# Patient Record
Sex: Female | Born: 1955
Health system: Southern US, Community
[De-identification: ages and names within clinical notes are randomized; demographics above are authoritative.]

## PROBLEM LIST (undated history)

## (undated) DIAGNOSIS — Z9289 Personal history of other medical treatment: Secondary | ICD-10-CM

## (undated) DIAGNOSIS — I10 Essential (primary) hypertension: Secondary | ICD-10-CM

## (undated) DIAGNOSIS — Z720 Tobacco use: Secondary | ICD-10-CM

## (undated) DIAGNOSIS — G459 Transient cerebral ischemic attack, unspecified: Secondary | ICD-10-CM

## (undated) DIAGNOSIS — Z8489 Family history of other specified conditions: Secondary | ICD-10-CM

## (undated) DIAGNOSIS — J45909 Unspecified asthma, uncomplicated: Secondary | ICD-10-CM

## (undated) DIAGNOSIS — R0600 Dyspnea, unspecified: Secondary | ICD-10-CM

## (undated) DIAGNOSIS — D649 Anemia, unspecified: Secondary | ICD-10-CM

## (undated) DIAGNOSIS — Z7189 Other specified counseling: Secondary | ICD-10-CM

## (undated) DIAGNOSIS — D500 Iron deficiency anemia secondary to blood loss (chronic): Principal | ICD-10-CM

## (undated) HISTORY — DX: Essential (primary) hypertension: I10

## (undated) HISTORY — DX: Iron deficiency anemia secondary to blood loss (chronic): D50.0

## (undated) HISTORY — PX: TRANSURETHRAL RESECTION OF BLADDER TUMOR WITH GYRUS (TURBT-GYRUS): SHX6458

## (undated) HISTORY — DX: Other specified counseling: Z71.89

## (undated) HISTORY — PX: OTHER SURGICAL HISTORY: SHX169

## (undated) HISTORY — DX: Tobacco use: Z72.0

---

## 2009-07-27 ENCOUNTER — Emergency Department (HOSPITAL_BASED_OUTPATIENT_CLINIC_OR_DEPARTMENT_OTHER): Admission: EM | Admit: 2009-07-27 | Discharge: 2009-07-27 | Payer: Self-pay | Admitting: Emergency Medicine

## 2009-08-31 ENCOUNTER — Ambulatory Visit: Payer: Self-pay | Admitting: Family

## 2009-08-31 DIAGNOSIS — F17200 Nicotine dependence, unspecified, uncomplicated: Secondary | ICD-10-CM | POA: Insufficient documentation

## 2009-08-31 DIAGNOSIS — I10 Essential (primary) hypertension: Secondary | ICD-10-CM | POA: Insufficient documentation

## 2009-08-31 LAB — CONVERTED CEMR LAB
ALT: 17 units/L (ref 0–35)
AST: 20 units/L (ref 0–37)
Albumin: 4 g/dL (ref 3.5–5.2)
Alkaline Phosphatase: 116 units/L (ref 39–117)
BUN: 22 mg/dL (ref 6–23)
Basophils Absolute: 0 10*3/uL (ref 0.0–0.1)
Basophils Relative: 0.1 % (ref 0.0–3.0)
Bilirubin, Direct: 0.1 mg/dL (ref 0.0–0.3)
CO2: 29 meq/L (ref 19–32)
Calcium: 9.5 mg/dL (ref 8.4–10.5)
Chloride: 105 meq/L (ref 96–112)
Cholesterol: 182 mg/dL (ref 0–200)
Creatinine, Ser: 1.5 mg/dL — ABNORMAL HIGH (ref 0.4–1.2)
Eosinophils Absolute: 0.2 10*3/uL (ref 0.0–0.7)
Eosinophils Relative: 4.4 % (ref 0.0–5.0)
GFR calc non Af Amer: 46.61 mL/min (ref >60)
Glucose, Bld: 90 mg/dL (ref 70–99)
HCT: 40.3 % (ref 36.0–46.0)
HDL: 65.2 mg/dL (ref >39.00)
Hemoglobin: 13.4 g/dL (ref 12.0–15.0)
LDL Cholesterol: 102 mg/dL — ABNORMAL HIGH (ref 0–99)
Lymphocytes Relative: 17.7 % (ref 12.0–46.0)
Lymphs Abs: 1 10*3/uL (ref 0.7–4.0)
MCHC: 33.2 g/dL (ref 30.0–36.0)
MCV: 94.8 fL (ref 78.0–100.0)
Monocytes Absolute: 0.9 10*3/uL (ref 0.1–1.0)
Monocytes Relative: 15.8 % — ABNORMAL HIGH (ref 3.0–12.0)
Neutro Abs: 3.3 10*3/uL (ref 1.4–7.7)
Neutrophils Relative %: 62 % (ref 43.0–77.0)
Platelets: 175 10*3/uL (ref 150.0–400.0)
Potassium: 4.1 meq/L (ref 3.5–5.1)
RBC: 4.25 M/uL (ref 3.87–5.11)
RDW: 14.1 % (ref 11.5–14.6)
Sodium: 140 meq/L (ref 135–145)
TSH: 0.66 microintl units/mL (ref 0.35–5.50)
Total Bilirubin: 0.6 mg/dL (ref 0.3–1.2)
Total CHOL/HDL Ratio: 3
Total Protein: 8.1 g/dL (ref 6.0–8.3)
Triglycerides: 76 mg/dL (ref 0.0–149.0)
VLDL: 15.2 mg/dL (ref 0.0–40.0)
WBC: 5.4 10*3/uL (ref 4.5–10.5)

## 2009-09-01 ENCOUNTER — Encounter: Payer: Self-pay | Admitting: Family

## 2009-09-16 ENCOUNTER — Ambulatory Visit: Payer: Self-pay | Admitting: Family

## 2010-08-24 NOTE — Assessment & Plan Note (Signed)
Summary: New to be est , - jr   Vital Signs:  Patient profile:   55 year old female Height:      65 inches Weight:      90.6 pounds BMI:     15.13 Pulse rate:   72 / minute BP sitting:   142 / 98  (left arm)  Vitals Entered By: Doristine Devoid (August 31, 2009 11:09 AM) CC: NEW EST- refill on meds and something to help w/ smoking    CC:  NEW EST- refill on meds and something to help w/ smoking .  History of Present Illness: Donna Curry is a 55 year old female who presents today to establish care.  Notes that she went to the [facility] ER after her sister who is an Charity fundraiser checked her blood pressure and found it to be high.  She was prescribed Atenolol/chlorthalidone 100-25mg , and lisinopril 20mg .  Tells me that she was on these medicines prior to her trip to the ER but she had run out.  She has previously been seen in the [facility] med center.  She has never had a primary care provider.       Past History:  Past Medical History: Hypertension back pain  Past Surgical History: Tubal ligation ?intrautine ablation for heavy bleeding (done in [place])  Family History: CAD-no HTN-father,mother,sister DM-father STROKE-no COLON CA-no BREAST CA-no  Mom- hypertension Dad- deceased due to diabetes complications age 69.  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  Physical Exam  General:  Very thin african [place] female, appears older than stated age.   Head:  Normocephalic and atraumatic without obvious abnormalities. No apparent alopecia or balding. Neck:  No deformities, masses, or tenderness noted. Lungs:  Normal respiratory effort, chest expands symmetrically. Lungs  are clear to auscultation, no crackles or wheezes. Heart:  Normal rate and regular rhythm. S1 and S2 normal without gallop, murmur, click, rub or other extra sounds. Extremities:  no edema   Impression & Recommendations:  Problem # 1:  HYPERTENSION (ICD-401.9) Assessment Deteriorated  Patient ran out of medicine- did not take today. Will refill meds and have patient follow up in 2 weeks for BP check and complete physical.   BP today: 142/98  Her updated medication list for this problem includes:    Lisinopril 10 Mg Tabs (Lisinopril) ..... One tablet by mouth daily    Atenolol-chlorthalidone 100-25 Mg Tabs (Atenolol-chlorthalidone) ..... One tablet by mouth daily  Problem # 2:  TOBACCO ABUSE (ICD-305.1) Assessment: Comment Only  Wants to quit, patient will start on Chantix, side effects and risks discussed with patient including discontinuation of med and going directly to the ED if suicidal ideation.  Her updated medication list for this problem includes:    Chantix Starting Month Pak 0.5 Mg X 11 & 1 Mg X 42 Tabs (Varenicline tartrate) .Marland Kitchen... Take as directed  Encouraged smoking cessation and discussed different methods for smoking cessation. greater than 10 minutes spent on counselling.   Orders: Tobacco use cessation intensive >10 minutes (48546)  Complete Medication List: 1)  Chantix Starting Month Pak 0.5 Mg X 11 & 1 Mg X  42 Tabs (Varenicline tartrate) .... Take as directed 2)  Lisinopril 10 Mg Tabs (Lisinopril) .... One tablet by mouth daily 3)  Atenolol-chlorthalidone 100-25 Mg Tabs (Atenolol-chlorthalidone) .... One tablet by mouth daily  Other Orders: Venipuncture (61443) TLB-Lipid Panel (80061-LIPID) TLB-CBC Platelet - w/Differential (85025-CBCD) TLB-BMP (Basic Metabolic Panel-BMET) (80048-METABOL) TLB-Hepatic/Liver Function Pnl (80076-HEPATIC) TLB-TSH (Thyroid Stimulating Hormone) (15400-QQP)  Patient Instructions: 1)  Please return in [facility] to [facility] office  for a complete physical.   2)  Complete your lab work prior to leaving today. 3)  Start chantix- start 0.5mg  by mouth daily x 3 days, then 0.5 mg twice daily x 4 days, then 1mg  twice daily for 11 weeks.  Stop smoking after 7 days.   4)  Discontinue chantix and go to ER if you develop severe depression or suicidal thoughts. 5)  Limit your Sodium (Salt). Prescriptions: ATENOLOL-CHLORTHALIDONE 100-25 MG TABS (ATENOLOL-CHLORTHALIDONE) one tablet by mouth daily  #30 x 1   Entered and Authorized by:   Lemont Fillers FNP   Signed by:   Lemont Fillers FNP on 08/31/2009   Method used:   Electronically to        [facility] # [phone]* (retail)       [address]       [place], [place]  [address]       Ph: [phone]       Fax: [phone]   RxID:   [id number] LISINOPRIL 10 MG TABS (LISINOPRIL) one tablet by mouth daily  #30 x 1   Entered and Authorized by:   Lemont Fillers FNP   Signed by:   Lemont Fillers FNP on 08/31/2009   Method used:   Electronically to        [facility] # [phone]* (retail)       [address]       [place], [place]  [address]       Ph: [phone]       Fax: [phone]   RxID:   [id number] CHANTIX STARTING MONTH PAK 0.5 MG X 11 & 1 MG X 42 TABS (VARENICLINE TARTRATE) take as directed  #1 month x 0   Entered and Authorized by:   Lemont Fillers FNP   Signed by:   Lemont Fillers FNP on 08/31/2009   Method used:   Electronically to        [facility] # [phone]* (retail)       [address]       [place], [place]  [address]       Ph: [phone]       Fax: [phone]   RxID:   [id number]

## 2010-08-24 NOTE — Letter (Signed)
   [facility] [address] [place], [place] [address] [phone]    September 01, 2009   Donna Curry [address] [place], [place] [address]  RE:  LAB RESULTS  Dear  [name],  The following is an interpretation of your most recent lab tests.  Please take note of any instructions provided or changes to medications that have resulted from your lab work.  ELECTROLYTES:  Good - no changes needed  KIDNEY FUNCTION TESTS:  Fair - review at your next visit  LIVER FUNCTION TESTS:  Good - no changes needed  LIPID PANEL:  Good - no changes needed Triglyceride: 76.0   Cholesterol: 182   LDL: 102   HDL: 65.20   Chol/HDL%:  3  THYROID STUDIES:  Thyroid studies normal TSH: 0.66     DIABETIC STUDIES:  Excellent - no changes needed Blood Glucose: 90    CBC:  Good - no changes needed   Sincerely Yours,    Lemont Fillers FNP

## 2010-10-10 LAB — DIFFERENTIAL
Basophils Absolute: 0.1 10*3/uL (ref 0.0–0.1)
Basophils Relative: 3 % — ABNORMAL HIGH (ref 0–1)
Eosinophils Absolute: 0.1 10*3/uL (ref 0.0–0.7)
Eosinophils Relative: 2 % (ref 0–5)
Lymphocytes Relative: 29 % (ref 12–46)
Lymphs Abs: 1.4 10*3/uL (ref 0.7–4.0)
Monocytes Absolute: 0.4 10*3/uL (ref 0.1–1.0)
Monocytes Relative: 8 % (ref 3–12)
Neutro Abs: 2.8 10*3/uL (ref 1.7–7.7)
Neutrophils Relative %: 59 % (ref 43–77)

## 2010-10-10 LAB — BASIC METABOLIC PANEL
BUN: 19 mg/dL (ref 6–23)
CO2: 24 mEq/L (ref 19–32)
Calcium: 10.1 mg/dL (ref 8.4–10.5)
Chloride: 104 mEq/L (ref 96–112)
Creatinine, Ser: 1.1 mg/dL (ref 0.4–1.2)
GFR calc Af Amer: >60 mL/min (ref >60)
GFR calc non Af Amer: 52 mL/min — ABNORMAL LOW (ref >60)
Glucose, Bld: 102 mg/dL — ABNORMAL HIGH (ref 70–99)
Potassium: 4.2 mEq/L (ref 3.5–5.1)
Sodium: 141 mEq/L (ref 135–145)

## 2010-10-10 LAB — CBC
HCT: 40.4 % (ref 36.0–46.0)
Hemoglobin: 13.6 g/dL (ref 12.0–15.0)
MCHC: 33.6 g/dL (ref 30.0–36.0)
MCV: 92.4 fL (ref 78.0–100.0)
Platelets: 233 10*3/uL (ref 150–400)
RBC: 4.38 MIL/uL (ref 3.87–5.11)
RDW: 13.6 % (ref 11.5–15.5)
WBC: 4.8 10*3/uL (ref 4.0–10.5)

## 2011-03-01 ENCOUNTER — Ambulatory Visit (INDEPENDENT_AMBULATORY_CARE_PROVIDER_SITE_OTHER): Payer: Self-pay | Admitting: Family

## 2011-03-01 DIAGNOSIS — I10 Essential (primary) hypertension: Secondary | ICD-10-CM

## 2011-03-03 NOTE — Progress Notes (Signed)
  Subjective:    Patient ID: Donna Curry, female    DOB: August 31, 1955, 55 y.o.   MRN: [id number]  HPI    Review of Systems     Objective:   Physical Exam        Assessment & Plan:  .  Pt cancelled- not seen.

## 2011-03-07 ENCOUNTER — Telehealth: Payer: Self-pay | Admitting: Family

## 2011-03-07 NOTE — Telephone Encounter (Signed)
Refill for Atenolol-Chlorthaldone 100-25, and lisinopril 10 mg denied. Last office visit was February of 2011. Patient will need office visit for refills.  Call placed to patient [phone], voice recording reached stating the phone number is a non-working number.

## 2011-03-07 NOTE — Telephone Encounter (Signed)
Patient made an appt for 8-31 to re est. She would like a refill of lisinopril and atenolo/chlor? To last her until she comes in for her appt. [facility] on [address] in [place]

## 2011-03-09 ENCOUNTER — Ambulatory Visit: Payer: Self-pay | Admitting: Family

## 2011-03-09 ENCOUNTER — Encounter: Payer: Self-pay | Admitting: Family

## 2011-03-09 ENCOUNTER — Ambulatory Visit (INDEPENDENT_AMBULATORY_CARE_PROVIDER_SITE_OTHER): Payer: BC Managed Care – PPO | Admitting: Family

## 2011-03-09 VITALS — BP 230/110 | HR 86 | Temp 98.0°F | Ht 65.0 in | Wt 87.1 lb

## 2011-03-09 DIAGNOSIS — I10 Essential (primary) hypertension: Secondary | ICD-10-CM

## 2011-03-09 LAB — HEPATIC FUNCTION PANEL
ALT: 12 U/L (ref 0–35)
AST: 19 U/L (ref 0–37)
Albumin: 4.5 g/dL (ref 3.5–5.2)
Alkaline Phosphatase: 99 U/L (ref 39–117)
Bilirubin, Direct: 0.1 mg/dL (ref 0.0–0.3)
Indirect Bilirubin: 0.1 mg/dL (ref 0.0–0.9)
Total Bilirubin: 0.2 mg/dL — ABNORMAL LOW (ref 0.3–1.2)
Total Protein: 8 g/dL (ref 6.0–8.3)

## 2011-03-09 LAB — CBC WITH DIFFERENTIAL/PLATELET
Basophils Absolute: 0 10*3/uL (ref 0.0–0.1)
Basophils Relative: 1 % (ref 0–1)
Eosinophils Absolute: 0.2 10*3/uL (ref 0.0–0.7)
Eosinophils Relative: 3 % (ref 0–5)
HCT: 36.2 % (ref 36.0–46.0)
Hemoglobin: 11.8 g/dL — ABNORMAL LOW (ref 12.0–15.0)
Lymphocytes Relative: 31 % (ref 12–46)
Lymphs Abs: 1.5 10*3/uL (ref 0.7–4.0)
MCH: 28.2 pg (ref 26.0–34.0)
MCHC: 32.6 g/dL (ref 30.0–36.0)
MCV: 86.6 fL (ref 78.0–100.0)
Monocytes Absolute: 0.4 10*3/uL (ref 0.1–1.0)
Monocytes Relative: 8 % (ref 3–12)
Neutro Abs: 2.8 10*3/uL (ref 1.7–7.7)
Neutrophils Relative %: 57 % (ref 43–77)
Platelets: 240 10*3/uL (ref 150–400)
RBC: 4.18 MIL/uL (ref 3.87–5.11)
RDW: 17.3 % — ABNORMAL HIGH (ref 11.5–15.5)
WBC: 4.9 10*3/uL (ref 4.0–10.5)

## 2011-03-09 LAB — BASIC METABOLIC PANEL
BUN: 14 mg/dL (ref 6–23)
CO2: 24 mEq/L (ref 19–32)
Calcium: 9.7 mg/dL (ref 8.4–10.5)
Chloride: 105 mEq/L (ref 96–112)
Creat: 1.03 mg/dL (ref 0.50–1.10)
Glucose, Bld: 79 mg/dL (ref 70–99)
Potassium: 4.6 mEq/L (ref 3.5–5.3)
Sodium: 139 mEq/L (ref 135–145)

## 2011-03-09 MED ORDER — CLONIDINE HCL 0.1 MG PO TABS: 0.1000 mg | ORAL_TABLET | Freq: Two times a day (BID) | ORAL | Status: DC

## 2011-03-09 MED ORDER — ATENOLOL-CHLORTHALIDONE 100-25 MG PO TABS: 1.0000 | ORAL_TABLET | Freq: Every day | ORAL | Status: DC

## 2011-03-09 MED ORDER — LISINOPRIL 10 MG PO TABS: 10.0000 mg | ORAL_TABLET | Freq: Every day | ORAL | Status: DC

## 2011-03-09 NOTE — Assessment & Plan Note (Signed)
Deteriorated.  We discussed the importance of medication compliance and the importance of close follow up.  Case was discussed with Dr. Rodena Medin.  Will resume her home meds, add clonidine- first dose ASAP.  Check baseline labs, follow up in 2 days.

## 2011-03-09 NOTE — Patient Instructions (Addendum)
Follow up on Friday of this week. Complete your lab work on the first floor today. Start clonidine immediately. Do not abruptly discontinue the clonidine as this may increase rebound elevation of your blood pressure and increase your risk of stroke.

## 2011-03-09 NOTE — Progress Notes (Signed)
  Subjective:    Patient ID: Donna Curry, female    DOB: 1956-04-09, 55 y.o.   MRN: [id number]  HPI  Ms.  Donna Curry is a 55 yr old female who presents today "to establish care."  Review of her medical record shows that she was actually seen by me in the [facility] office back in 2011. She reports no steady primary care provider.  She has been using urgent cares.  HTN- has been out of her blood pressure medication for 3 months.  Patient has been treated for Chronic HTN for 3 years. She is currently on no medication, and poorly controlled. No associated S/S related to HTN.   Quality: chronic Modifying factor: meds Duration: Quite sometime Associated S/S: None.  The patient denies the following associated symptoms: Chest pain, dyspnea, blurred vision, headache, or lower extremity edema.     Review of Systems  Constitutional: Negative for fever and unexpected weight change.  Respiratory: Negative for shortness of breath.   Cardiovascular: Negative for chest pain and leg swelling.   See HPI  Past Medical History  Diagnosis Date  . Measles as a child  . Mumps as a child  . Hypertension   . Tobacco abuse     History   Social History  . Marital Status: Single    Spouse Name: N/A    Number of Children: N/A  . Years of Education: N/A   Occupational History  . Not on file.   Social History Main Topics  . Smoking status: Current Everyday Smoker -- 0.5 packs/day    Types: Cigarettes  . Smokeless tobacco: Never Used  . Alcohol Use: No  . Drug Use: Yes     marijuana   . Sexually Active: No   Other Topics Concern  . Not on file   Social History Narrative  . No narrative on file    Past Surgical History  Procedure Date  . Tubes tied     Family History  Problem Relation Age of Onset  . Hypertension Mother   . Diabetes Father   . Hypertension Brother   . HIV Brother     No Known Allergies  No current outpatient prescriptions on file prior to visit.    BP 240/132   Pulse 86  Temp(Src) 98 F (36.7 C) (Oral)  Ht 5\' 5"  (1.651 m)  Wt 87 lb 1.3 oz (39.499 kg)  BMI 14.49 kg/m2  SpO2 100%       Objective:   Physical Exam  Constitutional:       Cachectic appearing, pleasant AA female in NAD  Eyes: Conjunctivae are normal.  Neck: Neck supple.  Cardiovascular: Normal rate and regular rhythm.   Pulmonary/Chest: Effort normal and breath sounds normal.  Abdominal: Soft. Bowel sounds are normal. She exhibits no distension and no mass. There is no tenderness. There is no rebound and no guarding.  Musculoskeletal: She exhibits no edema.  Psychiatric: She has a normal mood and affect. Her behavior is normal. Judgment and thought content normal.          Assessment & Plan:  30 minutes spent with the patient today.  >50% of this time was spent counseling pt on her HTN.

## 2011-03-11 ENCOUNTER — Encounter: Payer: Self-pay | Admitting: Family

## 2011-03-11 ENCOUNTER — Ambulatory Visit (INDEPENDENT_AMBULATORY_CARE_PROVIDER_SITE_OTHER): Payer: BC Managed Care – PPO | Admitting: Family

## 2011-03-11 DIAGNOSIS — F17200 Nicotine dependence, unspecified, uncomplicated: Secondary | ICD-10-CM

## 2011-03-11 DIAGNOSIS — I10 Essential (primary) hypertension: Secondary | ICD-10-CM

## 2011-03-11 DIAGNOSIS — D649 Anemia, unspecified: Secondary | ICD-10-CM | POA: Insufficient documentation

## 2011-03-11 NOTE — Assessment & Plan Note (Signed)
Improved.  Pt instructed to continue current meds and follow up in 1 month for a complete fasting physical.

## 2011-03-11 NOTE — Patient Instructions (Signed)
Please follow up in 1 month for a complete physical. Come fasting to this appointment.

## 2011-03-11 NOTE — Progress Notes (Signed)
  Subjective:    Patient ID: Donna Curry, female    DOB: May 25, 1956, 55 y.o.   MRN: [id number]  HPI  Patient presents today for followup of hypertension.  Patient has been treated for Chronic HTN for quiet sometime. She is currently on tenoretic, clonidine and lisinopril, and is well controlled. No associated S/S related to HTN.   Quality: chronic Modifying factor: meds Duration: Quite sometime Associated S/S: None.  The patient denies the following associated symptoms: Chest pain, dyspnea, blurred vision, headache, or lower extremity edema. She reports that she is feeling better since getting back on her blood pressure medication.      Review of Systems See HPI  Past Medical History  Diagnosis Date  . Measles as a child  . Mumps as a child  . Hypertension   . Tobacco abuse     History   Social History  . Marital Status: Single    Spouse Name: N/A    Number of Children: N/A  . Years of Education: N/A   Occupational History  . Not on file.   Social History Main Topics  . Smoking status: Current Everyday Smoker -- 0.5 packs/day    Types: Cigarettes  . Smokeless tobacco: Never Used   Comment: no cigarettes since 03/09/11  . Alcohol Use: No  . Drug Use: Yes     marijuana   . Sexually Active: No   Other Topics Concern  . 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    Past Surgical History  Procedure Date  . Tubes tied   . Uterine ablation     about 2005    Family History  Problem Relation Age of Onset  . Hypertension Mother   . Diabetes Father   . Hypertension Brother   . HIV Brother     No Known Allergies  Current Outpatient Prescriptions on File Prior to Visit  Medication Sig Dispense Refill  . atenolol-chlorthalidone (TENORETIC) 100-25 MG per tablet Take 1 tablet by mouth daily.  30 tablet   2  . cloNIDine (CATAPRES) 0.1 MG tablet Take 1 tablet (0.1 mg total) by mouth 2 (two) times daily.  60 tablet  2  . lisinopril (PRINIVIL,ZESTRIL) 10 MG tablet Take 1 tablet (10 mg total) by mouth daily.  30 tablet  2    BP 140/92  Pulse 61  Temp(Src) 97.6 F (36.4 C) (Oral)  Ht 5\' 5"  (1.651 m)  Wt 86 lb (39.009 kg)  BMI 14.31 kg/m2       Objective:   Physical Exam  Constitutional: She appears well-developed and well-nourished.  HENT:  Head: Normocephalic and atraumatic.  Cardiovascular: Normal rate and regular rhythm.   No murmur heard. Pulmonary/Chest: Effort normal and breath sounds normal. No respiratory distress. She has no wheezes. She has no rales. She exhibits no tenderness.  Musculoskeletal: She exhibits no edema.  Psychiatric: She has a normal mood and affect. Her behavior is normal. Thought content normal.          Assessment & Plan:

## 2011-03-11 NOTE — Assessment & Plan Note (Signed)
She has not had a cigarette in 24 hours. I encouraged her to continue the good work with quitting smoking.

## 2011-03-25 ENCOUNTER — Ambulatory Visit: Payer: Self-pay | Admitting: Family

## 2011-04-11 ENCOUNTER — Encounter: Payer: BC Managed Care – PPO | Admitting: Family

## 2011-04-13 ENCOUNTER — Encounter: Payer: BC Managed Care – PPO | Admitting: Family

## 2011-04-13 DIAGNOSIS — Z0289 Encounter for other administrative examinations: Secondary | ICD-10-CM

## 2011-07-11 ENCOUNTER — Other Ambulatory Visit: Payer: Self-pay | Admitting: Family

## 2011-07-25 ENCOUNTER — Ambulatory Visit: Payer: BC Managed Care – PPO | Admitting: Family

## 2011-07-25 DIAGNOSIS — Z0289 Encounter for other administrative examinations: Secondary | ICD-10-CM

## 2011-08-22 ENCOUNTER — Other Ambulatory Visit: Payer: Self-pay | Admitting: *Deleted

## 2011-08-22 MED ORDER — ATENOLOL-CHLORTHALIDONE 100-25 MG PO TABS: 1.0000 | ORAL_TABLET | Freq: Every day | ORAL | Status: DC

## 2011-08-22 MED ORDER — LISINOPRIL 10 MG PO TABS: 10.0000 mg | ORAL_TABLET | Freq: Every day | ORAL | Status: DC

## 2011-08-22 MED ORDER — CLONIDINE HCL 0.1 MG PO TABS: 0.1000 mg | ORAL_TABLET | Freq: Two times a day (BID) | ORAL | Status: DC

## 2011-08-22 NOTE — Telephone Encounter (Signed)
OK to give 7 day supply- no refills.

## 2011-08-22 NOTE — Telephone Encounter (Signed)
Received voice message from pt requesting refills on: lisinopril, tenoretic and clonidine. Pt was last seen in 03/11/11 and advised f/u in 1 month for a physical. Pt has cancelled numerous appts. States she has scheduled f/u on 09/23/11. Please advise re: refills.

## 2011-08-22 NOTE — Telephone Encounter (Signed)
Attempted to reach pt and received message that wireless customer was not available. 7 days supply of each med below sent to pharmacy. Will try pt tomorrow.

## 2011-08-23 NOTE — Telephone Encounter (Signed)
Notified pt and rescheduled f/u for 08/30/11 at 11am with Sandford Craze, NP

## 2011-08-30 ENCOUNTER — Ambulatory Visit: Payer: BC Managed Care – PPO | Admitting: Family

## 2011-08-30 ENCOUNTER — Telehealth: Payer: Self-pay | Admitting: Family

## 2011-08-30 NOTE — Telephone Encounter (Signed)
She has cancelled 4 appointments and no showed twice.  She was told last week that she would only be given 7 day supply and that she would need to be seen before further refills could be called in.  She was due for follow up 5 months ago.  I cannot give her any further refills at this time without examining her in the office.  If she is unable to be seen in our office, then I recommend that have a friend bring her to the nearest urgent care for evaluation.

## 2011-08-30 NOTE — Telephone Encounter (Signed)
Please see previous phone note dated 08/22/11.

## 2011-08-30 NOTE — Telephone Encounter (Signed)
Notified pt of instructions below. States she has enough medication to last her until Friday and scheduled follow up for 09/02/11 at 10:45am.

## 2011-08-30 NOTE — Telephone Encounter (Signed)
YOU GAVE HER 7 PILLS WHEN SHE WAS HERE LAST FOR HER BP.  HER CAR WONT START WE RESCHEDULED HER APPOINTMENT TO THE 18TH @ 11.  SHE WILL NEED MEDS TILL THEN.  SHE CAN GET SOMEONE TO PICK THEM UP FOR HER.  IF YOU WANT TO SEND RX SEND TO [facility] ON [address] [place]

## 2011-09-02 ENCOUNTER — Ambulatory Visit: Payer: BC Managed Care – PPO | Admitting: Family

## 2011-09-02 DIAGNOSIS — Z0289 Encounter for other administrative examinations: Secondary | ICD-10-CM

## 2011-09-12 ENCOUNTER — Ambulatory Visit: Payer: BC Managed Care – PPO | Admitting: Family

## 2011-09-23 ENCOUNTER — Ambulatory Visit: Payer: BC Managed Care – PPO | Admitting: Family

## 2011-09-27 ENCOUNTER — Ambulatory Visit: Payer: BC Managed Care – PPO | Admitting: Family

## 2011-12-09 ENCOUNTER — Ambulatory Visit: Payer: BC Managed Care – PPO | Admitting: Family

## 2011-12-13 ENCOUNTER — Ambulatory Visit: Payer: BC Managed Care – PPO | Admitting: Family

## 2011-12-14 ENCOUNTER — Ambulatory Visit: Payer: BC Managed Care – PPO | Admitting: Family

## 2011-12-14 DIAGNOSIS — Z0289 Encounter for other administrative examinations: Secondary | ICD-10-CM

## 2012-01-27 ENCOUNTER — Ambulatory Visit: Payer: BC Managed Care – PPO | Admitting: Family

## 2012-01-30 ENCOUNTER — Telehealth: Payer: Self-pay | Admitting: *Deleted

## 2012-01-30 ENCOUNTER — Encounter: Payer: Self-pay | Admitting: Family

## 2012-01-30 ENCOUNTER — Ambulatory Visit (INDEPENDENT_AMBULATORY_CARE_PROVIDER_SITE_OTHER): Payer: BC Managed Care – PPO | Admitting: Family

## 2012-01-30 ENCOUNTER — Ambulatory Visit (HOSPITAL_BASED_OUTPATIENT_CLINIC_OR_DEPARTMENT_OTHER)
Admission: RE | Admit: 2012-01-30 | Discharge: 2012-01-30 | Disposition: A | Discharge status: Home / Self Care | Payer: BC Managed Care – PPO | Source: Ambulatory Visit | Attending: Family | Admitting: Family

## 2012-01-30 ENCOUNTER — Other Ambulatory Visit: Payer: Self-pay | Admitting: Family

## 2012-01-30 VITALS — BP 98/74 | HR 76 | Temp 97.9°F | Resp 16 | Ht 65.0 in | Wt 84.1 lb

## 2012-01-30 DIAGNOSIS — E8809 Other disorders of plasma-protein metabolism, not elsewhere classified: Secondary | ICD-10-CM

## 2012-01-30 DIAGNOSIS — M549 Dorsalgia, unspecified: Secondary | ICD-10-CM

## 2012-01-30 DIAGNOSIS — R7303 Prediabetes: Secondary | ICD-10-CM | POA: Insufficient documentation

## 2012-01-30 DIAGNOSIS — D72829 Elevated white blood cell count, unspecified: Secondary | ICD-10-CM

## 2012-01-30 DIAGNOSIS — R7309 Other abnormal glucose: Secondary | ICD-10-CM

## 2012-01-30 DIAGNOSIS — E278 Other specified disorders of adrenal gland: Secondary | ICD-10-CM | POA: Insufficient documentation

## 2012-01-30 DIAGNOSIS — Z1231 Encounter for screening mammogram for malignant neoplasm of breast: Secondary | ICD-10-CM

## 2012-01-30 DIAGNOSIS — E279 Disorder of adrenal gland, unspecified: Secondary | ICD-10-CM

## 2012-01-30 DIAGNOSIS — I10 Essential (primary) hypertension: Secondary | ICD-10-CM

## 2012-01-30 DIAGNOSIS — M5136 Other intervertebral disc degeneration, lumbar region: Secondary | ICD-10-CM | POA: Insufficient documentation

## 2012-01-30 DIAGNOSIS — Z0000 Encounter for general adult medical examination without abnormal findings: Secondary | ICD-10-CM

## 2012-01-30 LAB — BASIC METABOLIC PANEL
BUN: 27 mg/dL — ABNORMAL HIGH (ref 6–23)
CO2: 28 mEq/L (ref 19–32)
Calcium: 9.5 mg/dL (ref 8.4–10.5)
Chloride: 101 mEq/L (ref 96–112)
Creat: 1.32 mg/dL — ABNORMAL HIGH (ref 0.50–1.10)
Glucose, Bld: 140 mg/dL — ABNORMAL HIGH (ref 70–99)
Potassium: 4.6 mEq/L (ref 3.5–5.3)
Sodium: 136 mEq/L (ref 135–145)

## 2012-01-30 LAB — HEPATIC FUNCTION PANEL
ALT: 17 U/L (ref 0–35)
AST: 16 U/L (ref 0–37)
Albumin: 3.9 g/dL (ref 3.5–5.2)
Alkaline Phosphatase: 89 U/L (ref 39–117)
Bilirubin, Direct: 0.1 mg/dL (ref 0.0–0.3)
Indirect Bilirubin: 0.2 mg/dL (ref 0.0–0.9)
Total Bilirubin: 0.3 mg/dL (ref 0.3–1.2)
Total Protein: 7.2 g/dL (ref 6.0–8.3)

## 2012-01-30 LAB — CBC WITH DIFFERENTIAL/PLATELET
Basophils Absolute: 0 10*3/uL (ref 0.0–0.1)
Basophils Relative: 0 % (ref 0–1)
Eosinophils Absolute: 0.1 10*3/uL (ref 0.0–0.7)
Eosinophils Relative: 2 % (ref 0–5)
HCT: 36.4 % (ref 36.0–46.0)
Hemoglobin: 11.8 g/dL — ABNORMAL LOW (ref 12.0–15.0)
Lymphocytes Relative: 24 % (ref 12–46)
Lymphs Abs: 1.4 10*3/uL (ref 0.7–4.0)
MCH: 28.5 pg (ref 26.0–34.0)
MCHC: 32.4 g/dL (ref 30.0–36.0)
MCV: 87.9 fL (ref 78.0–100.0)
Monocytes Absolute: 0.4 10*3/uL (ref 0.1–1.0)
Monocytes Relative: 7 % (ref 3–12)
Neutro Abs: 4 10*3/uL (ref 1.7–7.7)
Neutrophils Relative %: 67 % (ref 43–77)
Platelets: 262 10*3/uL (ref 150–400)
RBC: 4.14 MIL/uL (ref 3.87–5.11)
RDW: 16.2 % — ABNORMAL HIGH (ref 11.5–15.5)
WBC: 5.9 10*3/uL (ref 4.0–10.5)

## 2012-01-30 NOTE — Assessment & Plan Note (Signed)
Note A1C is 6.0.  Plan to repeat in 3 months.

## 2012-01-30 NOTE — Assessment & Plan Note (Signed)
Will plan to contrasted CT if creatinine is stable to further evaluate abnormality in the RLQ and the ?adrenal adenomas.

## 2012-01-30 NOTE — Assessment & Plan Note (Addendum)
This is likely related to finding of degenerative disc disease of the lumbar spine noted on CT.  Recommended tylenol/motrin PRN.

## 2012-01-30 NOTE — Progress Notes (Signed)
Subjective:    Patient ID: Donna Curry, female    DOB: 1956/01/09, 56 y.o.   MRN: [id number]  HPI  Donna Curry is a 56 yr old female who presents today for hospital follow up.  Recently hospitalized 01/21/12 due to uncontrolled HTN, Hypercalcemia, hyperglycemia, proteinuria, nausea and vomiting.  Records are reviewed.  During this hospitalization, her blood pressure medications were adjusted and she was felt stable for discharge home. She did undergo CT head which was unremarkable except for some chronic microvascular changes.   1) Hyperglycemia- Her blood sugar was noted to be 182 upon admission. An A1C was drawn which was 6.0. TSH was normal during her hospitalization.  2) Hypercalcemia- Calcium was noted to be 11.3.  Her total protein was elevated at 10.0.  PTH remains pending.  Vitamin D  3) Hyperproteinemia-  SPEP was drawn during hospitalization- and results remain pending.   4)Marijuana abuse- urine drug tox tested postitive for marijuana.  5) Adrenal masses/abdominal abnormality-  Non-contrasted CT performed during hospitalization noted bilateral adrenal masses suggestive of adrenal adenomas.  Also noted wass and ovoid very dense focus in the mesentary of the RLQ.    6) back pain- Pt's chief complaint today is low back pain.  Note was made on CT of moderate DDD at L3-4 and L4-5.   Review of Systems See HPI  Past Medical History  Diagnosis Date  . Measles as a child  . Mumps as a child  . Hypertension   . Tobacco abuse     History   Social History  . Marital Status: Single    Spouse Name: N/A    Number of Children: N/A  . Years of Education: N/A   Occupational History  . Not on file.   Social History Main Topics  . Smoking status: Current Everyday Smoker -- 0.5 packs/day    Types: Cigarettes  . Smokeless tobacco: Never Used   Comment: no cigarettes since 03/09/11  . Alcohol Use: No  . Drug Use: Yes     marijuana   . Sexually Active: No   Other Topics  Concern  . 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    Past Surgical History  Procedure Date  . Tubes tied   . Uterine ablation     about 2005    Family History  Problem Relation Age of Onset  . Hypertension Mother   . Diabetes Father   . Hypertension Brother   . HIV Brother     No Known Allergies  Current Outpatient Prescriptions on File Prior to Visit  Medication Sig Dispense Refill  . atenolol-chlorthalidone (TENORETIC) 100-25 MG per tablet Take 1 tablet by mouth daily.  7 tablet  0  . lisinopril-hydrochlorothiazide (PRINZIDE,ZESTORETIC) 20-12.5 MG per tablet Take 1 tablet by mouth 2 (two) times daily.      Marland Kitchen DISCONTD: cloNIDine (CATAPRES) 0.1 MG tablet Take 1 tablet (0.1 mg total) by mouth 2 (two) times daily.  14 tablet  0    BP 98/74  Pulse 76  Temp 97.9 F (36.6 C) (Oral)  Resp 16  Ht 5\' 5"  (1.651 m)  Wt 84 lb 1.9 oz (38.157 kg)  BMI 14.00 kg/m2  SpO2 99%       Objective:   Physical Exam  Constitutional: She appears cachectic. She is cooperative. No distress.  Eyes: No scleral icterus.  Cardiovascular: Normal rate and regular rhythm.   No murmur heard. Pulmonary/Chest: Effort normal and breath sounds normal. No respiratory distress. She has no wheezes. She has no rales. She exhibits no tenderness.  Abdominal: Soft. Bowel sounds are normal. She exhibits no distension and no mass. There is no tenderness. There is no rebound and no guarding.  Lymphadenopathy:    She has no cervical adenopathy.  Neurological: She is alert.  Psychiatric: She has a normal mood and affect. Her behavior is normal. Judgment and thought content normal.          Assessment & Plan:

## 2012-01-30 NOTE — Assessment & Plan Note (Signed)
BP appears overtreated at this time.  Will cut back clonidine from 0.2 to 0.1 bid.  Continue current dosing of zestoretic.

## 2012-01-30 NOTE — Patient Instructions (Addendum)
Please complete your lab work prior to leaving.  Follow up in 2 weeks. Cut clonidine 0.2 mg in half and continue twice daily.

## 2012-01-30 NOTE — Assessment & Plan Note (Addendum)
Repeat Calcium level today. I did refer her for a mammogram today as she is overdue.  Await PTH results from [facility] (pending).

## 2012-01-30 NOTE — Telephone Encounter (Signed)
Message copied by Kathi Simpers on Mon Jan 30, 2012  4:06 PM ------      Message from: O'SULLIVAN, MELISSA      Created: Mon Jan 30, 2012  2:10 PM       Could you pls ask lab to add on LFT (hyperproteinemia) thanks

## 2012-01-30 NOTE — Assessment & Plan Note (Signed)
Await SPEP results from [facility]. Repeat protein today.

## 2012-01-30 NOTE — Telephone Encounter (Signed)
Test added per Selena Batten at [place].

## 2012-01-31 ENCOUNTER — Telehealth: Payer: Self-pay | Admitting: Family

## 2012-01-31 DIAGNOSIS — R1900 Intra-abdominal and pelvic swelling, mass and lump, unspecified site: Secondary | ICD-10-CM

## 2012-01-31 NOTE — Telephone Encounter (Signed)
Please call pt and let her know that her creatinine is slightly elevated- likely related to her history of htn.  We should still be able to get CT with contrast however.  I have ordered and she should be contacted about scheduling.

## 2012-01-31 NOTE — Telephone Encounter (Signed)
Attempted to reach pt and received message that caller was not accepting calls at this time; unable to leave message.

## 2012-01-31 NOTE — Telephone Encounter (Signed)
Patient returned phone call. [facility] # [phone]

## 2012-01-31 NOTE — Telephone Encounter (Signed)
Notified pt. 

## 2012-01-31 NOTE — Telephone Encounter (Signed)
Spoke with MD at [facility] re: CT abd/pelvis.  Authorization # obtained- [id number]

## 2012-02-01 ENCOUNTER — Ambulatory Visit (HOSPITAL_BASED_OUTPATIENT_CLINIC_OR_DEPARTMENT_OTHER)
Admission: RE | Admit: 2012-02-01 | Discharge: 2012-02-01 | Disposition: A | Discharge status: Home / Self Care | Payer: BC Managed Care – PPO | Source: Ambulatory Visit | Attending: Family | Admitting: Family

## 2012-02-01 ENCOUNTER — Telehealth: Payer: Self-pay | Admitting: Family

## 2012-02-01 DIAGNOSIS — E279 Disorder of adrenal gland, unspecified: Secondary | ICD-10-CM | POA: Insufficient documentation

## 2012-02-01 DIAGNOSIS — R9389 Abnormal findings on diagnostic imaging of other specified body structures: Secondary | ICD-10-CM | POA: Insufficient documentation

## 2012-02-01 DIAGNOSIS — R1900 Intra-abdominal and pelvic swelling, mass and lump, unspecified site: Secondary | ICD-10-CM

## 2012-02-01 DIAGNOSIS — M549 Dorsalgia, unspecified: Secondary | ICD-10-CM | POA: Insufficient documentation

## 2012-02-01 DIAGNOSIS — R1031 Right lower quadrant pain: Secondary | ICD-10-CM | POA: Insufficient documentation

## 2012-02-01 MED ORDER — IOHEXOL 300 MG/ML  SOLN
75.0000 mL | Freq: Once | INTRAMUSCULAR | Status: AC | PRN
  Administered 2012-02-01: 75 mL via INTRAVENOUS

## 2012-02-01 NOTE — Telephone Encounter (Signed)
Pls call pt and let her know that her CT shows non-cancerous appearing growths on her kidneys.   Also the area of concern on previous CT appears to be a surgical metal clip.  She should have a follow up CT in 1 year to make sure that the growths do not increase in size.

## 2012-02-03 NOTE — Telephone Encounter (Signed)
Notified pt and she voices understanding. 

## 2012-02-13 ENCOUNTER — Ambulatory Visit: Payer: BC Managed Care – PPO | Admitting: Family

## 2012-02-15 ENCOUNTER — Ambulatory Visit (INDEPENDENT_AMBULATORY_CARE_PROVIDER_SITE_OTHER): Payer: BC Managed Care – PPO | Admitting: Family

## 2012-02-15 ENCOUNTER — Encounter: Payer: Self-pay | Admitting: Family

## 2012-02-15 VITALS — BP 90/70 | HR 82 | Temp 97.5°F | Resp 16 | Ht 65.0 in | Wt 82.0 lb

## 2012-02-15 DIAGNOSIS — M549 Dorsalgia, unspecified: Secondary | ICD-10-CM

## 2012-02-15 DIAGNOSIS — I10 Essential (primary) hypertension: Secondary | ICD-10-CM

## 2012-02-15 MED ORDER — METHYLPREDNISOLONE (PAK) 4 MG PO TABS: ORAL_TABLET | ORAL | Status: AC

## 2012-02-15 NOTE — Progress Notes (Signed)
  Subjective:    Patient ID: Donna Curry, female    DOB: [birth year], 56 y.o.   MRN: [id number]  HPI  [name] is a 56 yr old female who presents today for follow up of her HTN. She reports that she has been cutting the catapres 0.2mg  in half and taking bid.  She continues her bid dosing of zestoretic.   BP Readings from Last 3 Encounters:  02/15/12 90/70  01/30/12 98/74  03/11/11 140/92   Low back pain- Started to bother her when she got out of the hospital.  No improvement with ibuprofen or tylenol.  Pain shoots down the left hip.     Review of Systems See HPI  Past Medical History  Diagnosis Date  . Measles as a child  . Mumps as a child  . Hypertension   . Tobacco abuse     History   Social History  . Marital Status: Single    Spouse Name: N/A    Number of Children: N/A  . Years of Education: N/A   Occupational History  . Not on file.   Social History Main Topics  . Smoking status: Current Everyday Smoker -- 0.5 packs/day    Types: Cigarettes  . Smokeless tobacco: Never Used   Comment: 1/4 pack per day  . Alcohol Use: No  . Drug Use: Yes     marijuana   . Sexually Active: No   Other Topics Concern  . 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    Past Surgical History  Procedure Date  . Tubes tied   . Uterine ablation     about 2005    Family History  Problem Relation Age of Onset  . Hypertension Mother   . Diabetes Father   . Hypertension Brother   . HIV Brother     No Known Allergies  Current Outpatient Prescriptions on File Prior to Visit  Medication Sig Dispense Refill  . cloNIDine (CATAPRES) 0.2 MG tablet Take 0.1 mg by mouth 2 (two) times daily.       Marland Kitchen lisinopril-hydrochlorothiazide (PRINZIDE,ZESTORETIC) 20-12.5 MG per tablet Take 0.5 tablets by mouth 2 (two) times daily.          BP 90/70  Pulse 82  Temp 97.5 F (36.4 C)  Resp 16  Ht 5\' 5"  (1.651 m)  Wt 82 lb (37.195 kg)  BMI 13.65 kg/m2  SpO2 100%       Objective:   Physical Exam  Constitutional: She appears well-developed. No distress.       Thin black female  Cardiovascular: Normal rate and regular rhythm.   No murmur heard. Pulmonary/Chest: Effort normal and breath sounds normal. No respiratory distress. She has no wheezes. She has no rales. She exhibits no tenderness.  Neurological:       Bilateral LE strength is 5/5  Psychiatric: She has a normal mood and affect. Her behavior is normal. Judgment and thought content normal.          Assessment & Plan:

## 2012-02-15 NOTE — Patient Instructions (Addendum)
Please follow up in 2 weeks

## 2012-02-17 NOTE — Assessment & Plan Note (Signed)
BP remains overtreated despite decrease in clonidine.  I have advised her to cut her zestoretic in half as well and follow up in 2 weeks.

## 2012-02-17 NOTE — Assessment & Plan Note (Signed)
Deteriorated.  Trial of medrol dose pack.  If no improvement, consider MRI.

## 2012-02-29 ENCOUNTER — Ambulatory Visit: Payer: BC Managed Care – PPO | Admitting: Family

## 2012-02-29 ENCOUNTER — Telehealth: Payer: Self-pay | Admitting: *Deleted

## 2012-02-29 DIAGNOSIS — E213 Hyperparathyroidism, unspecified: Secondary | ICD-10-CM

## 2012-02-29 NOTE — Telephone Encounter (Signed)
I see message below.  When pt calls back please let her know that I have reviewed her lab work from [facility],  I see that her parathyroid hormone was elevated. This can sometimes cause bone thinning.   I would like for her to have a bone density test performed and to see endocrinology to further evaluate. I will ask Marj to schedule bone density.

## 2012-02-29 NOTE — Telephone Encounter (Signed)
Received message from pt that she has been having dizziness and requested rx be sent to her pharmacy. Attempted to reach pt. Unable to leave message as home and cell recordings state # is not accepting messages at this time.

## 2012-02-29 NOTE — Telephone Encounter (Signed)
Pt called back and left message to call her at [phone]. Pt is requesting pain medication for her back and hip and something to help with dizziness.  Attempted to reach pt and received voicemail. Left message for pt to return my call.

## 2012-03-01 MED ORDER — TRAMADOL HCL 50 MG PO TABS: 50.0000 mg | ORAL_TABLET | Freq: Three times a day (TID) | ORAL | Status: AC | PRN

## 2012-03-01 NOTE — Telephone Encounter (Signed)
Pt reports dizziness has been the same as it was at her last visit. It occurs with bending and standing. Pt states she continues to have lower back pain radiating to her left hip and down to her knee. Pt has appt on Monday but states she does not feel like she can deal with these symptoms any longer and is requesting medications to help with both. Pt has tried ibuprofen and tylenol without relief of back/leg pain. Please advise.

## 2012-03-01 NOTE — Telephone Encounter (Signed)
rx sent cor tramadol. Caution pt not to drive after taking due to risk of drowsiness.

## 2012-03-01 NOTE — Telephone Encounter (Signed)
Notified pt. Advised her per verbal from Provider to check her BP this evening and she will call her to give her further instructions re: dizziness. Best number to reach pt is [phone].

## 2012-03-02 NOTE — Telephone Encounter (Signed)
Spoke with pt.  BP yesterday evening was 108/56.  Still having some dizziness. I advised pt to stop clonidine.  Keep upcoming apt on Monday AM.  Pt verbalizes understanding.

## 2012-03-05 ENCOUNTER — Ambulatory Visit: Payer: BC Managed Care – PPO | Admitting: Family

## 2012-03-09 ENCOUNTER — Telehealth: Payer: Self-pay | Admitting: Family

## 2012-03-09 ENCOUNTER — Ambulatory Visit (INDEPENDENT_AMBULATORY_CARE_PROVIDER_SITE_OTHER): Payer: BC Managed Care – PPO | Admitting: Family

## 2012-03-09 ENCOUNTER — Encounter: Payer: Self-pay | Admitting: Family

## 2012-03-09 VITALS — BP 140/84 | HR 73 | Temp 98.0°F | Resp 16 | Ht 65.0 in | Wt 84.1 lb

## 2012-03-09 DIAGNOSIS — M549 Dorsalgia, unspecified: Secondary | ICD-10-CM

## 2012-03-09 DIAGNOSIS — I10 Essential (primary) hypertension: Secondary | ICD-10-CM

## 2012-03-09 DIAGNOSIS — M545 Low back pain, unspecified: Secondary | ICD-10-CM

## 2012-03-09 DIAGNOSIS — F17200 Nicotine dependence, unspecified, uncomplicated: Secondary | ICD-10-CM

## 2012-03-09 DIAGNOSIS — M79605 Pain in left leg: Secondary | ICD-10-CM

## 2012-03-09 NOTE — Assessment & Plan Note (Signed)
BP looks better today, dizziness is improving.  Continue off of clonidine.  Monitor.

## 2012-03-09 NOTE — Progress Notes (Signed)
  Subjective:    Patient ID: Donna Curry, female    DOB: Sep 02, 1955, 56 y.o.   MRN: [id number]  HPI  [name] is a 56 yr old female who presents today for follow up.    Back pain- Reports only slight improvement with medrol dose pack. Stinging left hip pain.  Reports intermittent bilateral toe numbness.  Nicotine dependence- She is weaning down.  She is down to less than 1/2 pack.    HTN-Reports dizziness is improved but not resolved.     Review of Systems    see HPI  Past Medical History  Diagnosis Date  . Measles as a child  . Mumps as a child  . Hypertension   . Tobacco abuse     History   Social History  . Marital Status: Single    Spouse Name: N/A    Number of Children: N/A  . Years of Education: N/A   Occupational History  . Not on file.   Social History Main Topics  . Smoking status: Current Everyday Smoker -- 0.5 packs/day    Types: Cigarettes  . Smokeless tobacco: Never Used   Comment: 1/4 pack per day  . Alcohol Use: No  . Drug Use: Yes     marijuana   . Sexually Active: No   Other Topics Concern  . 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    Past Surgical History  Procedure Date  . Tubes tied   . Uterine ablation     about 2005    Family History  Problem Relation Age of Onset  . Hypertension Mother   . Diabetes Father   . Hypertension Brother   . HIV Brother     No Known Allergies  Current Outpatient Prescriptions on File Prior to Visit  Medication Sig Dispense Refill  . lisinopril-hydrochlorothiazide (PRINZIDE,ZESTORETIC) 20-12.5 MG per tablet Take 0.5 tablets by mouth 2 (two) times daily.       . traMADol (ULTRAM) 50 MG tablet Take 1 tablet (50 mg total) by mouth every 8 (eight) hours as needed for pain.  20 tablet  0    BP 140/84  Pulse 73  Temp 98 F (36.7 C)  (Oral)  Resp 16  Ht 5\' 5"  (1.651 m)  Wt 84 lb 1.9 oz (38.157 kg)  BMI 14.00 kg/m2  SpO2 97%    Objective:   Physical Exam  Constitutional: She appears well-developed and well-nourished. No distress.  Cardiovascular: Normal rate and regular rhythm.   No murmur heard. Pulmonary/Chest: Effort normal and breath sounds normal. No respiratory distress. She has no wheezes. She has no rales. She exhibits no tenderness.  Neurological:  Reflex Scores:      Patellar reflexes are 3+ on the right side and 3+ on the left side.      Achilles reflexes are 2+ on the right side and 2+ on the left side.      Bilateral LE strength is 5/5.  Unable to perform left straight leg raise due to extreme discomfort.    Skin: Skin is warm and dry.  Psychiatric: She has a normal mood and affect. Her behavior is normal. Judgment and thought content normal.          Assessment & Plan:

## 2012-03-09 NOTE — Patient Instructions (Addendum)
You will be contact about your referral for your MRI.  Please let us know if you have not heard back within 1 week about your referral. Please schedule a follow up appointment in 3 months.

## 2012-03-09 NOTE — Assessment & Plan Note (Signed)
Will obtain MRI to further evaluate.

## 2012-03-09 NOTE — Telephone Encounter (Signed)
Spoke with insurance re: MRI L spine.  MRI has been approved x 30 days.  Approval #: [id number].

## 2012-03-09 NOTE — Assessment & Plan Note (Signed)
Recommended trial of nicotine gum.

## 2012-03-13 ENCOUNTER — Telehealth: Payer: Self-pay | Admitting: Family

## 2012-03-13 ENCOUNTER — Ambulatory Visit (HOSPITAL_BASED_OUTPATIENT_CLINIC_OR_DEPARTMENT_OTHER)
Admission: RE | Admit: 2012-03-13 | Discharge: 2012-03-13 | Disposition: A | Discharge status: Home / Self Care | Payer: BC Managed Care – PPO | Source: Ambulatory Visit | Attending: Family | Admitting: Family

## 2012-03-13 DIAGNOSIS — R29898 Other symptoms and signs involving the musculoskeletal system: Secondary | ICD-10-CM | POA: Insufficient documentation

## 2012-03-13 DIAGNOSIS — M79605 Pain in left leg: Secondary | ICD-10-CM

## 2012-03-13 DIAGNOSIS — M4120 Other idiopathic scoliosis, site unspecified: Secondary | ICD-10-CM | POA: Insufficient documentation

## 2012-03-13 DIAGNOSIS — M47817 Spondylosis without myelopathy or radiculopathy, lumbosacral region: Secondary | ICD-10-CM | POA: Insufficient documentation

## 2012-03-13 DIAGNOSIS — M545 Low back pain, unspecified: Secondary | ICD-10-CM

## 2012-03-13 DIAGNOSIS — M79609 Pain in unspecified limb: Secondary | ICD-10-CM | POA: Insufficient documentation

## 2012-03-13 NOTE — Telephone Encounter (Signed)
Patient's Home phone #"not accepting calls at this time"; called mobile, LMOM with contact name & number for pt call back re: MRI results & further physician instructions and recommendations/SLS

## 2012-03-13 NOTE — Telephone Encounter (Signed)
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  I would like to get her set up for consultation with Neurosurgery to see what additional treatment they may be able to offer her.

## 2012-03-14 NOTE — Telephone Encounter (Signed)
Patient informed; understood & agreed to consult w/Neuro/SLS

## 2012-03-27 ENCOUNTER — Other Ambulatory Visit: Payer: Self-pay | Admitting: *Deleted

## 2012-03-27 MED ORDER — TRAMADOL HCL 50 MG PO TABS: 50.0000 mg | ORAL_TABLET | Freq: Three times a day (TID) | ORAL | Status: AC | PRN

## 2012-03-27 NOTE — Telephone Encounter (Signed)
Pt called requesting refill of tramadol for her back pain. Last refill was 03/01/12, #20.  Please advise.

## 2012-03-27 NOTE — Telephone Encounter (Signed)
Notified pt. 

## 2012-03-27 NOTE — Telephone Encounter (Signed)
Refill sent to her pharmacy

## 2012-04-24 ENCOUNTER — Telehealth: Payer: Self-pay | Admitting: Family

## 2012-04-24 MED ORDER — TRAMADOL HCL 50 MG PO TABS: 50.0000 mg | ORAL_TABLET | Freq: Three times a day (TID) | ORAL | Status: DC | PRN

## 2012-04-24 MED ORDER — LISINOPRIL-HYDROCHLOROTHIAZIDE 20-12.5 MG PO TABS: 0.5000 | ORAL_TABLET | Freq: Two times a day (BID) | ORAL | Status: DC

## 2012-04-24 NOTE — Telephone Encounter (Signed)
Please advise 

## 2012-04-24 NOTE — Telephone Encounter (Signed)
Appt has been scheduled with [facility] for 05/09/12. Attempted to reach pt and left message for pt to return my call.

## 2012-04-24 NOTE — Telephone Encounter (Signed)
Patient states that she needs a refill of lisinopril to be sent to [facility] on [address]  Also, patient states that she is still having side/back pain and would like to know if Efraim Kaufmann would call her in something?

## 2012-04-24 NOTE — Telephone Encounter (Signed)
Rx sent for tramadol. What is the status of her referral to neurosurgery?

## 2012-04-25 NOTE — Telephone Encounter (Signed)
Notified pt of completion. 

## 2012-05-29 ENCOUNTER — Ambulatory Visit: Payer: BC Managed Care – PPO | Admitting: Family

## 2012-06-01 ENCOUNTER — Ambulatory Visit: Payer: BC Managed Care – PPO | Admitting: Family

## 2012-06-06 ENCOUNTER — Ambulatory Visit (INDEPENDENT_AMBULATORY_CARE_PROVIDER_SITE_OTHER): Payer: BC Managed Care – PPO | Admitting: Family

## 2012-06-06 VITALS — BP 126/84 | HR 97 | Temp 98.2°F | Resp 16 | Ht 65.0 in | Wt 84.0 lb

## 2012-06-06 DIAGNOSIS — M549 Dorsalgia, unspecified: Secondary | ICD-10-CM

## 2012-06-06 DIAGNOSIS — R634 Abnormal weight loss: Secondary | ICD-10-CM

## 2012-06-06 DIAGNOSIS — R636 Underweight: Secondary | ICD-10-CM

## 2012-06-06 DIAGNOSIS — I10 Essential (primary) hypertension: Secondary | ICD-10-CM

## 2012-06-06 MED ORDER — TRAMADOL HCL 50 MG PO TABS: ORAL_TABLET | ORAL | Status: DC

## 2012-06-06 NOTE — Patient Instructions (Addendum)
Please return to complete your blood work at your earliest convenience. Add ensure one can 3 times daily after meals.  Follow up in 4 months.

## 2012-06-08 DIAGNOSIS — R634 Abnormal weight loss: Secondary | ICD-10-CM | POA: Insufficient documentation

## 2012-06-08 NOTE — Progress Notes (Signed)
  Subjective:    Patient ID: Donna Curry, female    DOB: 23-Oct-1955, 56 y.o.   MRN: [id number]  HPI  Donna Curry is a 56 yr old female who presents today for follow up.  1) HTN- she continues the lisinopril- hctz at decreased dosing of 1/2 tab bid. She is tolerating without problem.  2) Low back pain- she is scheduled for consult with neurosurgery. Notes continued pain and little improvement with tramadol.  3) Low weight- reports good appetite.  Concerned about her low weight.  Review of Systems See HPI  Past Medical History  Diagnosis Date  . Measles as a child  . Mumps as a child  . Hypertension   . Tobacco abuse     History   Social History  . Marital Status: Single    Spouse Name: Donna Curry    Number of Children: Donna Curry  . Years of Education: Donna Curry   Occupational History  . Not on file.   Social History Main Topics  . Smoking status: Current Every Day Smoker -- 0.5 packs/day    Types: Cigarettes  . Smokeless tobacco: Never Used     Comment: 1/4 pack per day  . Alcohol Use: No  . Drug Use: Yes     Comment: marijuana   . Sexually Active: No   Other Topics Concern  . 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    Past Surgical History  Procedure Date  . Tubes tied   . Uterine ablation     about 2005    Family History  Problem Relation Age of Onset  . Hypertension Mother   . Diabetes Father   . Hypertension Brother   . HIV Brother     No Known Allergies  Current Outpatient Prescriptions on File Prior to Visit  Medication Sig Dispense Refill  . lisinopril-hydrochlorothiazide (PRINZIDE,ZESTORETIC) 20-12.5 MG per tablet Take 0.5 tablets by mouth 2 (two) times daily.  30 tablet  2    BP 126/84  Pulse 97  Temp 98.2 F (36.8 C) (Oral)  Resp 16  Ht 5\' 5"  (1.651 m)  Wt 84 lb (38.102 kg)  BMI 13.98  kg/m2  SpO2 97%       Objective:   Physical Exam  Constitutional: She is oriented to person, place, and time.       Cachectic african [place] female in NAD  Cardiovascular: Normal rate and regular rhythm.   No murmur heard. Pulmonary/Chest: Effort normal and breath sounds normal. No respiratory distress. She has no wheezes. She has no rales. She exhibits no tenderness.  Musculoskeletal: She exhibits no edema.  Neurological: She is alert and oriented to person, place, and time.  Psychiatric: She has a normal mood and affect. Her behavior is normal. Judgment and thought content normal.          Assessment & Plan:

## 2012-06-08 NOTE — Assessment & Plan Note (Signed)
Stable.  Continue zestoretic at current dose

## 2012-06-08 NOTE — Assessment & Plan Note (Signed)
I recommended that she add ensure one can TID after meals.  Obtain TSH today to rule out hyperthryoid.

## 2012-06-08 NOTE — Assessment & Plan Note (Signed)
Unchanged. Increase tramadol to 1-2 tabs tid prn, pt to keep upcoming neurosurgical apt.

## 2012-06-11 ENCOUNTER — Inpatient Hospital Stay: Admission: RE | Admit: 2012-06-11 | Payer: BC Managed Care – PPO | Source: Ambulatory Visit

## 2012-08-28 ENCOUNTER — Other Ambulatory Visit: Payer: Self-pay | Admitting: Family

## 2012-10-03 ENCOUNTER — Ambulatory Visit: Payer: BC Managed Care – PPO | Admitting: Family

## 2012-10-03 DIAGNOSIS — Z0289 Encounter for other administrative examinations: Secondary | ICD-10-CM

## 2012-11-29 ENCOUNTER — Telehealth: Payer: Self-pay | Admitting: Family

## 2012-11-29 NOTE — Telephone Encounter (Signed)
Please advise 

## 2012-11-29 NOTE — Telephone Encounter (Signed)
Caller: Donna Curry/Patient; Phone: [phone]; Reason for Call: Patient states she cannot come in to office for a visit until the end of May 2014 due to receiving her paycheck then.  States needs refill on blood pressure medication/lisinopril and back pain medication/tramedol.  Uses [facility] [employer] [address].  Declines triage; states unable to come to office before the end of the month.  Denies emergent back pain symptoms.  Info to office for provider review/bridge Rx until seen/callback.  Wants to schedule well visit/annual physical appt at the end of the month.  May reach patient at [phone] or [phone].

## 2012-11-29 NOTE — Telephone Encounter (Signed)
OK to send 30 tabs of lisinopril HCTZ.  I would recommend trial of aleve 220mg  twice daily for the next one week for back pain.  Tramadol should not be used long term.

## 2012-11-30 MED ORDER — LISINOPRIL-HYDROCHLOROTHIAZIDE 20-12.5 MG PO TABS: 0.5000 | ORAL_TABLET | Freq: Two times a day (BID) | ORAL | Status: DC

## 2012-11-30 NOTE — Telephone Encounter (Signed)
Patient was given advise. Patient stated that she will wait until she sees you concerning tramadol. Patient not willing to take aleve.

## 2013-03-20 ENCOUNTER — Telehealth: Payer: Self-pay | Admitting: Family

## 2013-03-20 DIAGNOSIS — E278 Other specified disorders of adrenal gland: Secondary | ICD-10-CM

## 2013-03-20 NOTE — Telephone Encounter (Signed)
Unable to reach pt at both #s listed. Will mail letter to pt.

## 2013-03-20 NOTE — Telephone Encounter (Signed)
Please call pt and let her know that it is time to repeat her CT scan of her abdomen to evaluate the growths on her kidneys.  She will need bmet prior to CT. Orders pended.

## 2013-03-26 ENCOUNTER — Telehealth: Payer: Self-pay | Admitting: Family

## 2013-03-26 DIAGNOSIS — N2889 Other specified disorders of kidney and ureter: Secondary | ICD-10-CM

## 2013-03-26 LAB — BASIC METABOLIC PANEL
BUN: 28 mg/dL — ABNORMAL HIGH (ref 6–23)
CO2: 28 mEq/L (ref 19–32)
Calcium: 9.3 mg/dL (ref 8.4–10.5)
Chloride: 100 mEq/L (ref 96–112)
Creat: 1.27 mg/dL — ABNORMAL HIGH (ref 0.50–1.10)
Glucose, Bld: 115 mg/dL — ABNORMAL HIGH (ref 70–99)
Potassium: 4.1 mEq/L (ref 3.5–5.3)
Sodium: 138 mEq/L (ref 135–145)

## 2013-03-26 NOTE — Telephone Encounter (Signed)
Pt presented for bmet, an ed follow up apt will be scheduled and we will plan to order CT abdomen at that time.

## 2013-03-27 ENCOUNTER — Ambulatory Visit (INDEPENDENT_AMBULATORY_CARE_PROVIDER_SITE_OTHER): Payer: BC Managed Care – PPO | Admitting: Family

## 2013-03-27 ENCOUNTER — Encounter: Payer: Self-pay | Admitting: Family

## 2013-03-27 VITALS — BP 110/82 | HR 90 | Temp 98.0°F | Resp 16 | Wt 79.1 lb

## 2013-03-27 DIAGNOSIS — I10 Essential (primary) hypertension: Secondary | ICD-10-CM

## 2013-03-27 DIAGNOSIS — R002 Palpitations: Secondary | ICD-10-CM

## 2013-03-27 DIAGNOSIS — R634 Abnormal weight loss: Secondary | ICD-10-CM

## 2013-03-27 DIAGNOSIS — M549 Dorsalgia, unspecified: Secondary | ICD-10-CM

## 2013-03-27 MED ORDER — TRAMADOL HCL 50 MG PO TABS: 50.0000 mg | ORAL_TABLET | Freq: Three times a day (TID) | ORAL | Status: DC | PRN

## 2013-03-27 MED ORDER — METHYLPREDNISOLONE (PAK) 4 MG PO TABS: ORAL_TABLET | ORAL | Status: DC

## 2013-03-27 NOTE — Assessment & Plan Note (Signed)
Recommended that she add ensure bid in addition to her meals.  Focus on healthy high calorie snacks such as dried fruit and nuts.

## 2013-03-27 NOTE — Assessment & Plan Note (Signed)
EKG notes NSR.  Check TSH.

## 2013-03-27 NOTE — Patient Instructions (Addendum)
Complete lab work prior to leaving. Start medrol dose pak. Add ensure one can twice daily. Eat healthy high calorie snacks such as dried fruits and nuts. Follow up in 2 weeks.

## 2013-03-27 NOTE — Progress Notes (Signed)
Subjective:    Patient ID: Donna Curry, female    DOB: [birth year], 57 y.o.   MRN: [id number]  HPI  Donna Curry is a 57 yr old female who presents today for follow up. She was recently seen in the ED for back pain. She was seen Saturday AM at 10 AM.  Prior to this she has had 2 week hx of back pain. Reports that she tried tylenol prior to this without improvement. 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  Pain is constant and severe. Starts in the right lower back and wraps around to the front.  She reports no improvement with hydrocodone.  She has tried an ice pack with an ice pack.  Pain is 10/10.  HTN- reports that her bp was extremely high in the ED.  Reports that her lisinopril hctz was increased.   Reports waking up with night sweats x 1 week.  Has had palpitation and 5 pound unintentional weight loss.    Review of Systems See HPI  Past Medical History  Diagnosis Date  . Measles as a child  . Mumps as a child  . Hypertension   . Tobacco abuse     History   Social History  . Marital Status: Single    Spouse Name: N/A    Number of Children: N/A  . Years of Education: N/A   Occupational History  . Not on file.   Social History Main Topics  . Smoking status: Former Smoker -- 0.50 packs/day    Types: Cigarettes    Quit date: 03/22/2013  . Smokeless tobacco: Never Used     Comment: 1/4 pack per day  . Alcohol Use: No  . Drug Use: Yes     Comment: marijuana   . Sexual Activity: No   Other Topics Concern  . Not on file   Social History Narrative   Denies hx of drug use   Single   1 daughter age 38 lives with daughter and grandson who is 77.   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    Past Surgical History  Procedure Laterality Date  . Tubes tied    . Uterine ablation      about 2005    Family History  Problem Relation Age of Onset  . Hypertension Mother   . Diabetes Father   .  Hypertension Brother   . HIV Brother     No Known Allergies  Current Outpatient Prescriptions on File Prior to Visit  Medication Sig Dispense Refill  . traMADol (ULTRAM) 50 MG tablet TAKE ONE TO TWO TABLETS BY MOUTH EVERY 8 HOURS AS NEEDED FOR PAIN (MAY CAUSE DROWSINESS)  60 tablet  0   No current facility-administered medications on file prior to visit.    BP 110/82  Pulse 90  Temp(Src) 98 F (36.7 C) (Oral)  Resp 16  Wt 79 lb 1.3 oz (35.87 kg)  BMI 13.16 kg/m2  SpO2 97%       Objective:   Physical Exam  Constitutional:  Cachectic black female in NAD.   Cardiovascular: Normal rate and regular rhythm.   No murmur heard. Pulmonary/Chest: Effort normal and breath sounds normal. No respiratory distress. She has no wheezes. She has no rales. She exhibits no tenderness.  Musculoskeletal: She exhibits no edema.       Lumbar back: She exhibits tenderness.  Bilateral LE strength is 5/5  Neurological:  Reflex Scores:      Patellar reflexes are 2+ on the right side and 2+ on the left side.         Assessment & Plan:

## 2013-03-27 NOTE — Assessment & Plan Note (Signed)
Follow up BP here today is improved.  Continue current meds. BMET stable.   BP Readings from Last 3 Encounters:  03/27/13 110/82  06/06/12 126/84  03/09/12 140/84

## 2013-03-27 NOTE — Assessment & Plan Note (Addendum)
Deteriorated. Trial of medrol dose pak. Obtain x ray of the lumbar spine. Note provided for work.

## 2013-03-28 LAB — TSH: TSH: 0.785 u[IU]/mL (ref 0.350–4.500)

## 2013-03-28 NOTE — Telephone Encounter (Signed)
Mailed letter °

## 2013-03-30 ENCOUNTER — Ambulatory Visit (HOSPITAL_BASED_OUTPATIENT_CLINIC_OR_DEPARTMENT_OTHER)
Admission: RE | Admit: 2013-03-30 | Discharge: 2013-03-30 | Disposition: A | Discharge status: Home / Self Care | Payer: BC Managed Care – PPO | Source: Ambulatory Visit | Attending: Family | Admitting: Family

## 2013-03-30 ENCOUNTER — Encounter: Payer: Self-pay | Admitting: Family

## 2013-03-30 DIAGNOSIS — M549 Dorsalgia, unspecified: Secondary | ICD-10-CM

## 2013-03-30 DIAGNOSIS — J438 Other emphysema: Secondary | ICD-10-CM | POA: Insufficient documentation

## 2013-03-30 DIAGNOSIS — J984 Other disorders of lung: Secondary | ICD-10-CM | POA: Insufficient documentation

## 2013-03-30 DIAGNOSIS — E279 Disorder of adrenal gland, unspecified: Secondary | ICD-10-CM | POA: Insufficient documentation

## 2013-03-30 DIAGNOSIS — I700 Atherosclerosis of aorta: Secondary | ICD-10-CM | POA: Insufficient documentation

## 2013-03-30 DIAGNOSIS — M545 Low back pain, unspecified: Secondary | ICD-10-CM | POA: Insufficient documentation

## 2013-03-30 DIAGNOSIS — E278 Other specified disorders of adrenal gland: Secondary | ICD-10-CM

## 2013-03-30 DIAGNOSIS — M439 Deforming dorsopathy, unspecified: Secondary | ICD-10-CM | POA: Insufficient documentation

## 2013-03-30 DIAGNOSIS — I517 Cardiomegaly: Secondary | ICD-10-CM | POA: Insufficient documentation

## 2013-03-30 DIAGNOSIS — M954 Acquired deformity of chest and rib: Secondary | ICD-10-CM | POA: Insufficient documentation

## 2013-03-30 DIAGNOSIS — M47817 Spondylosis without myelopathy or radiculopathy, lumbosacral region: Secondary | ICD-10-CM | POA: Insufficient documentation

## 2013-03-30 MED ORDER — IOHEXOL 300 MG/ML  SOLN
100.0000 mL | Freq: Once | INTRAMUSCULAR | Status: AC | PRN
  Administered 2013-03-30: 70 mL via INTRAVENOUS

## 2013-04-01 ENCOUNTER — Telehealth: Payer: Self-pay | Admitting: Family

## 2013-04-01 NOTE — Telephone Encounter (Signed)
Called pt to follow up. Reports some improvement but still feeling weak.   Reviewed CT results with her.  Notes Constipation. Recommended trial of miralax one cap PO daily for next few days. Pt verbalizes understanding.

## 2013-04-02 ENCOUNTER — Telehealth: Payer: Self-pay | Admitting: Family

## 2013-04-02 DIAGNOSIS — M519 Unspecified thoracic, thoracolumbar and lumbosacral intervertebral disc disorder: Secondary | ICD-10-CM

## 2013-04-02 MED ORDER — MELOXICAM 7.5 MG PO TABS: 7.5000 mg | ORAL_TABLET | Freq: Every day | ORAL | Status: DC

## 2013-04-02 NOTE — Telephone Encounter (Signed)
Spoke with pt.  She had CT abdomen and plain film of spine last week. She did have an MRI back in 8/13.  Reports that she did not follow through with the neurosurgical referral that we made for her last summer due to transportation issues.  She is agreeable to referral at this time. Will arrange.  Reports no improvement in her pain with tramadol and medrol. Will add meloxicam for short term.

## 2013-04-02 NOTE — Addendum Note (Signed)
Addended by: Sandford Craze on: 04/02/2013 04:35 PM   Modules accepted: Orders

## 2013-04-03 ENCOUNTER — Telehealth: Payer: Self-pay | Admitting: Family

## 2013-04-03 NOTE — Telephone Encounter (Signed)
OK to return to work tomorrow.  Called number listed not available.  Left message with family member. He recommended that we call back in about 1 hour.

## 2013-04-03 NOTE — Telephone Encounter (Signed)
Patient states that she is still having back pain and would like to know if Efraim Kaufmann would write her out of work until Monday?

## 2013-04-03 NOTE — Telephone Encounter (Signed)
Patient called back stating that she only needs to be written out of work for today, not until Monday. She states that she needs to know by 3pm and would like a callback before then.

## 2013-04-03 NOTE — Telephone Encounter (Signed)
Pt called back and stated she was confused on the day of the week and actually needs letter for today and tomorrow. Received verbal auth from Provider. Letter completed and placed at front desk for pick up. Pt notified.

## 2013-04-03 NOTE — Telephone Encounter (Signed)
Received message from [place] at [facility] requesting verification of strength of meloxicam that was phone in yesterday. Spoke with pharmacist and gave verification.

## 2013-04-10 ENCOUNTER — Ambulatory Visit: Payer: BC Managed Care – PPO | Admitting: Family

## 2013-04-12 ENCOUNTER — Encounter: Payer: Self-pay | Admitting: Family

## 2013-04-12 ENCOUNTER — Ambulatory Visit (INDEPENDENT_AMBULATORY_CARE_PROVIDER_SITE_OTHER): Payer: BC Managed Care – PPO | Admitting: Family

## 2013-04-12 VITALS — BP 96/68 | HR 66 | Temp 98.2°F | Resp 18 | Ht 65.0 in | Wt 81.0 lb

## 2013-04-12 DIAGNOSIS — M79604 Pain in right leg: Secondary | ICD-10-CM

## 2013-04-12 DIAGNOSIS — R42 Dizziness and giddiness: Secondary | ICD-10-CM

## 2013-04-12 DIAGNOSIS — M545 Low back pain, unspecified: Secondary | ICD-10-CM

## 2013-04-12 DIAGNOSIS — R634 Abnormal weight loss: Secondary | ICD-10-CM

## 2013-04-12 DIAGNOSIS — M549 Dorsalgia, unspecified: Secondary | ICD-10-CM

## 2013-04-12 NOTE — Assessment & Plan Note (Addendum)
She has gained 2 pounds since her last visit. She is still underweight and plans to add boost. We also discussed adding dried fruits and nuts.

## 2013-04-12 NOTE — Progress Notes (Signed)
Subjective:    Patient ID: Donna Curry, female    DOB: 1956/02/16, 57 y.o.   MRN: [id number]  HPI  Donna Curry is a 57 yr old female who presents today for follow up of her back pain.   She has returned to work.  Reports that pain is worsened by mopping and dust mopping. Pain radiates into the right hip. Has not yet started meloxicam, but plans to pick up today.  She reports that she had dizziness yesterday. Dizziness is now resolved.      Review of Systems See HPI  Past Medical History  Diagnosis Date  . Measles as a child  . Mumps as a child  . Hypertension   . Tobacco abuse     History   Social History  . Marital Status: Single    Spouse Name: N/A    Number of Children: N/A  . Years of Education: N/A   Occupational History  . Not on file.   Social History Main Topics  . Smoking status: Former Smoker -- 0.50 packs/day    Types: Cigarettes    Quit date: 03/22/2013  . Smokeless tobacco: Never Used     Comment: 1/4 pack per day  . Alcohol Use: No  . Drug Use: Yes     Comment: marijuana   . Sexual Activity: No   Other Topics Concern  . Not on file   Social History Narrative   Denies hx of drug use   Single   1 daughter age 76 lives with daughter and grandson who is 37.   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    Past Surgical History  Procedure Laterality Date  . Tubes tied    . Uterine ablation      about 2005    Family History  Problem Relation Age of Onset  . Hypertension Mother   . Diabetes Father   . Hypertension Brother   . HIV Brother     No Known Allergies  Current Outpatient Prescriptions on File Prior to Visit  Medication Sig Dispense Refill  . lisinopril-hydrochlorothiazide (PRINZIDE,ZESTORETIC) 20-12.5 MG per tablet Take 1 tablet by mouth 2 (two) times daily.      . methylPREDNIsolone (MEDROL DOSPACK) 4 MG tablet follow package directions  21 tablet  0  . traMADol (ULTRAM) 50 MG tablet Take 1  tablet (50 mg total) by mouth every 8 (eight) hours as needed for pain.  30 tablet  0  . meloxicam (MOBIC) 7.5 MG tablet Take 1 tablet (7.5 mg total) by mouth daily.  14 tablet  0   No current facility-administered medications on file prior to visit.    BP 96/68  Pulse 66  Temp(Src) 98.2 F (36.8 C) (Oral)  Resp 18  Ht 5\' 5"  (1.651 m)  Wt 81 lb (36.741 kg)  BMI 13.48 kg/m2  SpO2 99%       Objective:   Physical Exam  Constitutional:  Cachectic AA female, awake, alert in NAD  HENT:  Right Ear: Tympanic membrane and ear canal normal.  Left Ear: Tympanic membrane and ear canal normal.  Cardiovascular: Normal rate and regular rhythm.   No murmur heard. Pulmonary/Chest: Effort normal and breath sounds normal. No respiratory distress. She has no wheezes. She has no rales. She exhibits no tenderness.  Musculoskeletal: She exhibits no edema.  Neurological:  Reflex Scores:      Patellar reflexes are 2+ on the right side and 2+ on  the left side. Bilateral LE strength is 5/5          Assessment & Plan:

## 2013-04-12 NOTE — Assessment & Plan Note (Signed)
Resolved. Etiology unclear.  Monitor for now.

## 2013-04-12 NOTE — Assessment & Plan Note (Signed)
Unchanged. Will refer for MRI to further evaluate.  Further recommendations pending MRI results. Start meloxicam.

## 2013-04-12 NOTE — Patient Instructions (Addendum)
Start meloxicam. You will be contacted about your MRI.   Follow up 6 weeks.

## 2013-04-20 ENCOUNTER — Ambulatory Visit (HOSPITAL_BASED_OUTPATIENT_CLINIC_OR_DEPARTMENT_OTHER)
Admission: RE | Admit: 2013-04-20 | Discharge: 2013-04-20 | Disposition: A | Discharge status: Home / Self Care | Payer: BC Managed Care – PPO | Source: Ambulatory Visit | Attending: Family | Admitting: Family

## 2013-04-20 DIAGNOSIS — M5380 Other specified dorsopathies, site unspecified: Secondary | ICD-10-CM | POA: Insufficient documentation

## 2013-04-20 DIAGNOSIS — M545 Low back pain: Secondary | ICD-10-CM

## 2013-04-20 DIAGNOSIS — M5146 Schmorl's nodes, lumbar region: Secondary | ICD-10-CM | POA: Insufficient documentation

## 2013-04-20 DIAGNOSIS — M4120 Other idiopathic scoliosis, site unspecified: Secondary | ICD-10-CM | POA: Insufficient documentation

## 2013-04-20 DIAGNOSIS — M79604 Pain in right leg: Secondary | ICD-10-CM

## 2013-04-24 ENCOUNTER — Other Ambulatory Visit: Payer: Self-pay | Admitting: Family

## 2013-04-24 DIAGNOSIS — M545 Low back pain, unspecified: Secondary | ICD-10-CM

## 2013-04-24 NOTE — Telephone Encounter (Signed)
Please let pt know that her FMLA is complete.  MRI shows degenerative disc disease and arthritis in her back.  I recommend that she start PT to help with back pain.  Pended below.

## 2013-04-25 ENCOUNTER — Other Ambulatory Visit: Payer: Self-pay | Admitting: Family

## 2013-04-25 NOTE — Telephone Encounter (Signed)
Left message with pt's daughter to have pt return my call. 

## 2013-04-25 NOTE — Telephone Encounter (Signed)
Medication name:  Name from pharmacy:  traMADol (ULTRAM) 50 MG tablet  TRAMADOL HCL 50MG  TAB Sig: TAKE ONE TABLET BY MOUTH EVERY 8 HOURS AS NEEDED FOR PAIN Dispense: 30 tablet (Pharmacy requested 30 each) Refills: 0 Start: 04/25/2013 Class: Normal Requested on: 03/27/2013 Originally ordered on: 03/27/2013 Last refill: 03/27/2013

## 2013-04-25 NOTE — Telephone Encounter (Signed)
Received message from pt to call her back at [phone]. Attempted to reach pt and received voicemail for unknown name. Did not leave message. Will try again later.

## 2013-04-25 NOTE — Telephone Encounter (Signed)
Pt called back and left message that correct phone # is [phone]. Notified pt and she is agreeable to proceed with PT. Pt states meloxicam has not been helping and would like refill of Tramadol. FMLA papers placed at front desk for pick up.  Please advise.

## 2013-04-25 NOTE — Telephone Encounter (Signed)
OK to send tramadol as below please.

## 2013-04-26 NOTE — Telephone Encounter (Signed)
Refill called to pharmacy voicemail. 

## 2013-04-26 NOTE — Telephone Encounter (Signed)
OK to send 30 tabs zero refills.  

## 2013-04-26 NOTE — Telephone Encounter (Signed)
Refill was called to pharmacy voicemail. Left message for to return my call re: rx completion.

## 2013-05-13 ENCOUNTER — Ambulatory Visit: Payer: BC Managed Care – PPO | Attending: Family | Admitting: Rehabilitation

## 2013-05-13 DIAGNOSIS — IMO0001 Reserved for inherently not codable concepts without codable children: Secondary | ICD-10-CM | POA: Insufficient documentation

## 2013-05-13 DIAGNOSIS — M545 Low back pain, unspecified: Secondary | ICD-10-CM | POA: Insufficient documentation

## 2013-05-13 DIAGNOSIS — M25559 Pain in unspecified hip: Secondary | ICD-10-CM | POA: Insufficient documentation

## 2013-05-13 DIAGNOSIS — R293 Abnormal posture: Secondary | ICD-10-CM | POA: Insufficient documentation

## 2013-05-13 DIAGNOSIS — M6281 Muscle weakness (generalized): Secondary | ICD-10-CM | POA: Insufficient documentation

## 2013-05-16 ENCOUNTER — Ambulatory Visit: Payer: BC Managed Care – PPO | Admitting: Physical Therapy

## 2013-05-21 ENCOUNTER — Ambulatory Visit: Payer: BC Managed Care – PPO | Admitting: Rehabilitation

## 2013-05-21 ENCOUNTER — Telehealth: Payer: Self-pay | Admitting: Family

## 2013-05-21 MED ORDER — LISINOPRIL-HYDROCHLOROTHIAZIDE 20-12.5 MG PO TABS: 1.0000 | ORAL_TABLET | Freq: Two times a day (BID) | ORAL | Status: DC

## 2013-05-21 NOTE — Telephone Encounter (Signed)
Refill- lisino hctz 20-12.5 tab. Take one tablet by mouth twice daily. Qty 60 last fill 9.1.14

## 2013-05-21 NOTE — Telephone Encounter (Signed)
Refill sent.

## 2013-05-22 ENCOUNTER — Ambulatory Visit: Payer: BC Managed Care – PPO | Admitting: Family

## 2013-05-22 DIAGNOSIS — Z0289 Encounter for other administrative examinations: Secondary | ICD-10-CM

## 2013-05-23 ENCOUNTER — Ambulatory Visit: Payer: BC Managed Care – PPO | Admitting: Rehabilitation

## 2013-05-24 ENCOUNTER — Ambulatory Visit: Payer: BC Managed Care – PPO | Admitting: Family

## 2013-05-27 ENCOUNTER — Telehealth: Payer: Self-pay | Admitting: *Deleted

## 2013-05-27 ENCOUNTER — Ambulatory Visit: Payer: BC Managed Care – PPO | Attending: Family | Admitting: Rehabilitation

## 2013-05-27 DIAGNOSIS — M545 Low back pain, unspecified: Secondary | ICD-10-CM | POA: Insufficient documentation

## 2013-05-27 DIAGNOSIS — R293 Abnormal posture: Secondary | ICD-10-CM | POA: Insufficient documentation

## 2013-05-27 DIAGNOSIS — M6281 Muscle weakness (generalized): Secondary | ICD-10-CM | POA: Insufficient documentation

## 2013-05-27 DIAGNOSIS — M25559 Pain in unspecified hip: Secondary | ICD-10-CM | POA: Insufficient documentation

## 2013-05-27 DIAGNOSIS — IMO0001 Reserved for inherently not codable concepts without codable children: Secondary | ICD-10-CM | POA: Insufficient documentation

## 2013-05-27 NOTE — Telephone Encounter (Signed)
Faxed refill request received from pharmacy for Tramadol Last filled by MD on 10.02.14, #30x0 Last AEX - 09.19.14 Next AEX - 6 Weeks; [no show 10.29.14, canceled 10.31.14], No future appt scheduled Please Advise/SLS

## 2013-05-27 NOTE — Telephone Encounter (Signed)
OK to send 30 tabs but please let pt know that we need to see her back in the office for follow up prior to additional refills.

## 2013-05-28 MED ORDER — TRAMADOL HCL 50 MG PO TABS: ORAL_TABLET | ORAL | Status: DC

## 2013-05-28 NOTE — Telephone Encounter (Signed)
Rx called to pharmacy voicemail.  Please call pt to arrange appt. 

## 2013-05-28 NOTE — Telephone Encounter (Signed)
Left message with woman for patient to return my call. 

## 2013-05-29 NOTE — Telephone Encounter (Signed)
Informed patient of medication refill and she scheduled appointment for 05/31/13

## 2013-05-29 NOTE — Telephone Encounter (Signed)
Left message with woman for patient to return my call. 

## 2013-05-30 ENCOUNTER — Ambulatory Visit: Payer: BC Managed Care – PPO | Admitting: Physical Therapy

## 2013-05-31 ENCOUNTER — Ambulatory Visit: Payer: BC Managed Care – PPO | Admitting: Family

## 2013-06-03 ENCOUNTER — Ambulatory Visit: Payer: BC Managed Care – PPO | Admitting: Rehabilitation

## 2013-06-06 ENCOUNTER — Ambulatory Visit: Payer: BC Managed Care – PPO | Admitting: Physical Therapy

## 2013-06-07 ENCOUNTER — Ambulatory Visit: Payer: BC Managed Care – PPO | Admitting: Family

## 2013-06-10 ENCOUNTER — Ambulatory Visit: Payer: BC Managed Care – PPO | Admitting: Physical Therapy

## 2013-06-13 ENCOUNTER — Ambulatory Visit: Payer: BC Managed Care – PPO | Admitting: Rehabilitation

## 2013-06-17 ENCOUNTER — Ambulatory Visit: Payer: BC Managed Care – PPO | Admitting: Physical Therapy

## 2013-06-18 ENCOUNTER — Ambulatory Visit (INDEPENDENT_AMBULATORY_CARE_PROVIDER_SITE_OTHER): Payer: BC Managed Care – PPO | Admitting: Family

## 2013-06-18 ENCOUNTER — Encounter: Payer: Self-pay | Admitting: Family

## 2013-06-18 VITALS — BP 120/78 | HR 73 | Temp 97.9°F | Resp 16 | Ht 65.0 in | Wt 83.1 lb

## 2013-06-18 DIAGNOSIS — R634 Abnormal weight loss: Secondary | ICD-10-CM

## 2013-06-18 DIAGNOSIS — J069 Acute upper respiratory infection, unspecified: Secondary | ICD-10-CM

## 2013-06-18 DIAGNOSIS — M545 Low back pain, unspecified: Secondary | ICD-10-CM

## 2013-06-18 DIAGNOSIS — M5136 Other intervertebral disc degeneration, lumbar region: Secondary | ICD-10-CM

## 2013-06-18 DIAGNOSIS — M5137 Other intervertebral disc degeneration, lumbosacral region: Secondary | ICD-10-CM

## 2013-06-18 DIAGNOSIS — M51379 Other intervertebral disc degeneration, lumbosacral region without mention of lumbar back pain or lower extremity pain: Secondary | ICD-10-CM

## 2013-06-18 MED ORDER — HYDROCODONE-ACETAMINOPHEN 5-325 MG PO TABS: 1.0000 | ORAL_TABLET | Freq: Four times a day (QID) | ORAL | Status: DC | PRN

## 2013-06-18 NOTE — Assessment & Plan Note (Signed)
Wt Readings from Last 3 Encounters:  06/18/13 83 lb 1.9 oz (37.703 kg)  04/12/13 81 lb (36.741 kg)  03/27/13 79 lb 1.3 oz (35.87 kg)   Weight actually looks a bit better. We discussed continuing ensure, add dried fruits/nuts/peanut butter.

## 2013-06-18 NOTE — Assessment & Plan Note (Signed)
Unchanged.  Will refer to Neurosurgeon in [place] as this is more convenient for her.  D/c Tramadol, rx provided for vicodin. Discussed with pt that this will only be used short term.

## 2013-06-18 NOTE — Patient Instructions (Signed)
You may use mucinex as needed for cough/congestion. Call if you develop fever, if cold symptoms worsen or if not improved in 2-3 days.  You will be contacted about your referral to then Neurosurgeon in [place]. Follow up in 2 months.

## 2013-06-18 NOTE — Progress Notes (Signed)
Subjective:    Patient ID: Donna Curry, female    DOB: 1955/08/02, 57 y.o.   MRN: [id number]  HPI  Donna Curry is a 57 yr old female who presents today for follow up of her low back pain/left hip pain. Was unable to keep her appointment with neurosurgery due to transportation issue. Pain is unchanged. She would like referral to neurosurgeon in [place].  URI- reports cold symptoms x 1 week with chest congestion. Reports symptoms are improving.   Review of Systems See HPI  Past Medical History  Diagnosis Date  . Measles as a child  . Mumps as a child  . Hypertension   . Tobacco abuse     History   Social History  . Marital Status: Single    Spouse Name: N/A    Number of Children: N/A  . Years of Education: N/A   Occupational History  . Not on file.   Social History Main Topics  . Smoking status: Former Smoker -- 0.50 packs/day    Types: Cigarettes    Quit date: 03/22/2013  . Smokeless tobacco: Never Used     Comment: 1/4 pack per day  . Alcohol Use: No  . Drug Use: Yes     Comment: marijuana   . Sexual Activity: No   Other Topics Concern  . Not on file   Social History Narrative   Denies hx of drug use   Single   1 daughter age 64 lives with daughter and grandson who is 63.   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    Past Surgical History  Procedure Laterality Date  . Tubes tied    . Uterine ablation      about 2005    Family History  Problem Relation Age of Onset  . Hypertension Mother   . Diabetes Father   . Hypertension Brother   . HIV Brother     No Known Allergies  Current Outpatient Prescriptions on File Prior to Visit  Medication Sig Dispense Refill  . lisinopril-hydrochlorothiazide (PRINZIDE,ZESTORETIC) 20-12.5 MG per tablet Take 1 tablet by mouth 2 (two) times daily.  60 tablet  3  . meloxicam (MOBIC) 7.5 MG tablet Take 1 tablet (7.5 mg total) by mouth daily.  14 tablet  0  . traMADol (ULTRAM) 50 MG  tablet TAKE ONE TABLET BY MOUTH EVERY 8 HOURS AS NEEDED FOR PAIN  30 tablet  0   No current facility-administered medications on file prior to visit.    BP 120/78  Pulse 73  Temp(Src) 97.9 F (36.6 C) (Oral)  Resp 16  Ht 5\' 5"  (1.651 m)  Wt 83 lb 1.9 oz (37.703 kg)  BMI 13.83 kg/m2  SpO2 98%       Objective:   Physical Exam  Constitutional: She is oriented to person, place, and time. She appears well-developed.  Appears uncomfortable cachectic  HENT:  Right Ear: Tympanic membrane and ear canal normal.  Left Ear: Tympanic membrane and ear canal normal.  Cardiovascular: Normal rate and regular rhythm.   No murmur heard. Pulmonary/Chest: Effort normal. No respiratory distress. She has no wheezes. She exhibits no tenderness.  Few coarse upper airway rhonchi  Musculoskeletal: She exhibits no edema.  Lumbar scoliosis is noted.   Lymphadenopathy:    She has no cervical adenopathy.  Neurological: She is alert and oriented to person, place, and time.  Skin: Skin is warm and dry.  Psychiatric: She has a normal mood  and affect. Her behavior is normal. Judgment and thought content normal.          Assessment & Plan:

## 2013-06-18 NOTE — Assessment & Plan Note (Signed)
Improving. Add mucinex prn, pt to call if symptoms worsen or if symptoms do not continue to improve.

## 2013-07-12 ENCOUNTER — Telehealth: Payer: Self-pay | Admitting: Family

## 2013-07-12 NOTE — Telephone Encounter (Signed)
Request refill on HYDROcodone-acetaminophen (NORCO/VICODIN) 5-325 MG per tablet

## 2013-07-15 MED ORDER — HYDROCODONE-ACETAMINOPHEN 5-325 MG PO TABS: 1.0000 | ORAL_TABLET | Freq: Four times a day (QID) | ORAL | Status: DC | PRN

## 2013-07-15 NOTE — Telephone Encounter (Signed)
Rx placed at front desk for pick up. Left message for pt to return my call. 

## 2013-07-15 NOTE — Telephone Encounter (Signed)
Last Rx provided 06/18/13, #30 x no refills.  Rx printed and forwarded to Provider for signature.

## 2013-07-16 NOTE — Telephone Encounter (Signed)
Patient returned phone call. Informed her that rx was at front desk.

## 2013-08-20 ENCOUNTER — Telehealth: Payer: Self-pay | Admitting: Family

## 2013-08-20 ENCOUNTER — Ambulatory Visit (INDEPENDENT_AMBULATORY_CARE_PROVIDER_SITE_OTHER): Payer: BC Managed Care – PPO | Admitting: Family

## 2013-08-20 ENCOUNTER — Encounter: Payer: Self-pay | Admitting: Family

## 2013-08-20 VITALS — BP 122/90 | HR 78 | Temp 98.1°F | Resp 12 | Ht 65.0 in | Wt 91.0 lb

## 2013-08-20 DIAGNOSIS — I10 Essential (primary) hypertension: Secondary | ICD-10-CM

## 2013-08-20 DIAGNOSIS — M5136 Other intervertebral disc degeneration, lumbar region: Secondary | ICD-10-CM

## 2013-08-20 DIAGNOSIS — R634 Abnormal weight loss: Secondary | ICD-10-CM

## 2013-08-20 DIAGNOSIS — M5137 Other intervertebral disc degeneration, lumbosacral region: Secondary | ICD-10-CM

## 2013-08-20 DIAGNOSIS — M51379 Other intervertebral disc degeneration, lumbosacral region without mention of lumbar back pain or lower extremity pain: Secondary | ICD-10-CM

## 2013-08-20 LAB — BASIC METABOLIC PANEL
BUN: 15 mg/dL (ref 6–23)
CO2: 29 mEq/L (ref 19–32)
Calcium: 9.5 mg/dL (ref 8.4–10.5)
Chloride: 103 mEq/L (ref 96–112)
Creat: 1.1 mg/dL (ref 0.50–1.10)
Glucose, Bld: 87 mg/dL (ref 70–99)
Potassium: 4 mEq/L (ref 3.5–5.3)
Sodium: 140 mEq/L (ref 135–145)

## 2013-08-20 MED ORDER — LISINOPRIL-HYDROCHLOROTHIAZIDE 20-12.5 MG PO TABS: 1.0000 | ORAL_TABLET | Freq: Two times a day (BID) | ORAL | Status: DC

## 2013-08-20 MED ORDER — HYDROCODONE-ACETAMINOPHEN 5-325 MG PO TABS: 1.0000 | ORAL_TABLET | Freq: Four times a day (QID) | ORAL | Status: DC | PRN

## 2013-08-20 NOTE — Progress Notes (Signed)
Subjective:    Patient ID: Donna Curry, female    DOB: 06/29/1956, 58 y.o.   MRN: [id number]  HPI  Donna Curry is a 58 yr old female who presents today for follow up of HTN.  She is maintained on lisinopril-hctz.  Yesterday had stomach virus/vomitting and vomited her pill. Took this AM. Stomach feeling better.   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 Takes hydrocodone at night.  This helps her sleep with back pain. Does not take during the day due to sleepiness as side effect. Requesting refill.   Underweight- has been trying to eat healthy high calorie snacks.  Wt Readings from Last 3 Encounters:  08/20/13 91 lb 0.6 oz (41.295 kg)  06/18/13 83 lb 1.9 oz (37.703 kg)  04/12/13 81 lb (36.741 kg)     Review of Systems    see HPI  Past Medical History  Diagnosis Date  . Measles as a child  . Mumps as a child  . Hypertension   . Tobacco abuse     History   Social History  . Marital Status: Single    Spouse Name: N/A    Number of Children: N/A  . Years of Education: N/A   Occupational History  . Not on file.   Social History Main Topics  . Smoking status: Former Smoker -- 0.50 packs/day    Types: Cigarettes    Quit date: 03/22/2013  . Smokeless tobacco: Never Used     Comment: 1/4 pack per day  . Alcohol Use: No  . Drug Use: Yes     Comment: marijuana   . Sexual Activity: No   Other Topics Concern  . Not on file   Social History Narrative   Denies hx of drug use   Single   1 daughter age 74 lives with daughter and grandson who is 18.   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    Past Surgical History  Procedure Laterality Date  . Tubes tied    . Uterine ablation      about 2005    Family History  Problem Relation Age of Onset  . Hypertension Mother   . Diabetes Father   . Hypertension  Brother   . HIV Brother     No Known Allergies  Current Outpatient Prescriptions on File Prior to Visit  Medication Sig Dispense Refill  . meloxicam (MOBIC) 7.5 MG tablet Take 1 tablet (7.5 mg total) by mouth daily.  14 tablet  0   No current facility-administered medications on file prior to visit.    BP 122/90  Pulse 78  Temp(Src) 98.1 F (36.7 C) (Oral)  Resp 12  Ht 5\' 5"  (1.651 m)  Wt 91 lb 0.6 oz (41.295 kg)  BMI 15.15 kg/m2  SpO2 98%    Objective:   Physical Exam  Constitutional: She is oriented to person, place, and time.  Thin AA female, awake, alert, pleasant and in NAD  HENT:  Head: Normocephalic and atraumatic.  Cardiovascular: Normal rate and regular rhythm.   No murmur heard. Pulmonary/Chest: Effort normal and breath sounds normal. No respiratory distress. She has no wheezes. She has no rales. She exhibits no tenderness.  Neurological: She is alert and oriented to person, place, and time.  Skin: Skin is warm and dry.  Psychiatric: She has a normal mood  and affect. Her behavior is normal. Judgment and thought content normal.          Assessment & Plan:

## 2013-08-20 NOTE — Assessment & Plan Note (Signed)
Will ask [facility] to check status of referral.  Refill provided for hydrocodone.

## 2013-08-20 NOTE — Patient Instructions (Signed)
Please follow up in 3 months.  Complete lab work prior to leaving.  You will be contacted about your referral to neurosurgery- let me now if you have not heard back from them in 1 week.

## 2013-08-20 NOTE — Progress Notes (Signed)
Pre visit review using our clinic review tool, if applicable. No additional management support is needed unless otherwise documented below in the visit note. 

## 2013-08-20 NOTE — Assessment & Plan Note (Signed)
BP stable today. Continue current meds. Obtain follow up BMET.

## 2013-08-20 NOTE — Telephone Encounter (Signed)
Message copied by [name] on Tue Aug 20, 2013  2:48 PM ------      Message from: Synthia Innocent      Created: Tue Aug 20, 2013 10:35 AM       Pt will need to call and resch with [place] [facility]. Pt has no showed 2x, daughter is aware. Thanks      ----- Message -----         From: [name], NP         Sent: 08/20/2013  10:09 AM           To: Synthia Innocent            Could you please check status of neurosurgery apt, she states that she never heard back from them. thanks       ------

## 2013-08-20 NOTE — Assessment & Plan Note (Signed)
Weight is improving with dietary changes. Monitor.

## 2013-08-21 ENCOUNTER — Encounter: Payer: Self-pay | Admitting: Family

## 2013-08-28 ENCOUNTER — Telehealth: Payer: Self-pay | Admitting: Family

## 2013-08-28 NOTE — Telephone Encounter (Signed)
Relevant patient education mailed to patient.  

## 2013-09-25 ENCOUNTER — Telehealth: Payer: Self-pay | Admitting: Family

## 2013-09-25 MED ORDER — HYDROCODONE-ACETAMINOPHEN 5-325 MG PO TABS: 1.0000 | ORAL_TABLET | Freq: Four times a day (QID) | ORAL | Status: DC | PRN

## 2013-09-25 NOTE — Telephone Encounter (Signed)
Hydrocid/aceta 5-325 1 by mouth every 6 hours as needed for pain

## 2013-09-25 NOTE — Telephone Encounter (Signed)
Last rx printed 08/20/13, #30. Pt due for follow up in April.  Rx printed and forwarded to Provider for signature.

## 2013-09-25 NOTE — Telephone Encounter (Signed)
Notified pt's daughter that rx is at front desk for pick up.

## 2013-10-29 ENCOUNTER — Telehealth: Payer: Self-pay | Admitting: Family

## 2013-10-29 MED ORDER — HYDROCODONE-ACETAMINOPHEN 5-325 MG PO TABS: 1.0000 | ORAL_TABLET | Freq: Four times a day (QID) | ORAL | Status: DC | PRN

## 2013-10-29 NOTE — Telephone Encounter (Signed)
Request refill on vicodin

## 2013-10-29 NOTE — Telephone Encounter (Signed)
Last rx printed 09/25/13, #30.  Rx printed and forwarded to Provider for signature.

## 2013-10-30 NOTE — Telephone Encounter (Signed)
Rx sent to front desk for pick up. Pt notified.

## 2013-11-13 ENCOUNTER — Ambulatory Visit: Payer: BC Managed Care – PPO | Admitting: Family

## 2013-11-18 ENCOUNTER — Telehealth: Payer: Self-pay | Admitting: Family

## 2013-11-18 ENCOUNTER — Encounter: Payer: Self-pay | Admitting: Family

## 2013-11-18 ENCOUNTER — Ambulatory Visit (INDEPENDENT_AMBULATORY_CARE_PROVIDER_SITE_OTHER): Payer: BC Managed Care – PPO | Admitting: Family

## 2013-11-18 ENCOUNTER — Ambulatory Visit: Payer: BC Managed Care – PPO | Admitting: Family

## 2013-11-18 VITALS — BP 120/80 | HR 99 | Temp 98.3°F | Resp 16 | Ht 65.0 in | Wt 86.0 lb

## 2013-11-18 DIAGNOSIS — M5136 Other intervertebral disc degeneration, lumbar region: Secondary | ICD-10-CM

## 2013-11-18 DIAGNOSIS — M545 Low back pain, unspecified: Secondary | ICD-10-CM

## 2013-11-18 DIAGNOSIS — I10 Essential (primary) hypertension: Secondary | ICD-10-CM

## 2013-11-18 DIAGNOSIS — M5137 Other intervertebral disc degeneration, lumbosacral region: Secondary | ICD-10-CM

## 2013-11-18 MED ORDER — LISINOPRIL-HYDROCHLOROTHIAZIDE 20-12.5 MG PO TABS: 1.0000 | ORAL_TABLET | Freq: Two times a day (BID) | ORAL | Status: DC

## 2013-11-18 MED ORDER — HYDROCODONE-ACETAMINOPHEN 5-325 MG PO TABS: 1.0000 | ORAL_TABLET | Freq: Four times a day (QID) | ORAL | Status: DC | PRN

## 2013-11-18 MED ORDER — METHYLPREDNISOLONE 4 MG PO KIT: PACK | ORAL | Status: DC

## 2013-11-18 NOTE — Patient Instructions (Signed)
Start prednisone for back pain. Call if you have not heard back re: Neurosurgery referral in 1 week. Follow up in 1 month.

## 2013-11-18 NOTE — Assessment & Plan Note (Signed)
BP stable, refill lisinopril/hctz.

## 2013-11-18 NOTE — Telephone Encounter (Signed)
Relevant patient education mailed to patient.  

## 2013-11-18 NOTE — Assessment & Plan Note (Signed)
Deteriorated. Refill hydrocodone (controlled substance contract signed today) re-initiate referral to neurosurgery.

## 2013-11-18 NOTE — Progress Notes (Signed)
Pre visit review using our clinic review tool, if applicable. No additional management support is needed unless otherwise documented below in the visit note. 
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
## 2013-11-27 ENCOUNTER — Ambulatory Visit: Payer: BC Managed Care – PPO | Admitting: Family

## 2013-11-27 ENCOUNTER — Telehealth: Payer: Self-pay | Admitting: *Deleted

## 2013-11-27 NOTE — Telephone Encounter (Signed)
FMLA papers completed re: pt's severe low back pain (DDD) and faxed to attn: Benefits office at [phone].

## 2013-12-20 ENCOUNTER — Ambulatory Visit: Payer: BC Managed Care – PPO | Admitting: Family

## 2013-12-23 ENCOUNTER — Telehealth: Payer: Self-pay | Admitting: Family

## 2013-12-23 ENCOUNTER — Ambulatory Visit: Payer: BC Managed Care – PPO | Admitting: Family

## 2013-12-23 DIAGNOSIS — Z0289 Encounter for other administrative examinations: Secondary | ICD-10-CM

## 2013-12-23 NOTE — Telephone Encounter (Signed)
Patient was scheduled for a 1 month follow up today and did not come for her appointment

## 2013-12-23 NOTE — Telephone Encounter (Signed)
Please contact pt to reschedule appointment in the next 2-3 weeks.

## 2013-12-24 ENCOUNTER — Encounter: Payer: Self-pay | Admitting: Family

## 2013-12-24 NOTE — Telephone Encounter (Signed)
Called patient could not reach  Mailed letter

## 2014-06-25 ENCOUNTER — Ambulatory Visit (INDEPENDENT_AMBULATORY_CARE_PROVIDER_SITE_OTHER): Payer: BC Managed Care – PPO | Admitting: Physician Assistant

## 2014-06-25 ENCOUNTER — Encounter: Payer: Self-pay | Admitting: Physician Assistant

## 2014-06-25 VITALS — BP 140/95 | HR 81 | Temp 98.2°F | Resp 14 | Ht 65.0 in | Wt 84.5 lb

## 2014-06-25 DIAGNOSIS — M5136 Other intervertebral disc degeneration, lumbar region: Secondary | ICD-10-CM

## 2014-06-25 DIAGNOSIS — I10 Essential (primary) hypertension: Secondary | ICD-10-CM

## 2014-06-25 DIAGNOSIS — J069 Acute upper respiratory infection, unspecified: Secondary | ICD-10-CM

## 2014-06-25 MED ORDER — LISINOPRIL-HYDROCHLOROTHIAZIDE 20-12.5 MG PO TABS: 1.0000 | ORAL_TABLET | Freq: Two times a day (BID) | ORAL | Status: DC

## 2014-06-25 MED ORDER — HYDROCODONE-ACETAMINOPHEN 5-325 MG PO TABS: 1.0000 | ORAL_TABLET | Freq: Four times a day (QID) | ORAL | Status: DC | PRN

## 2014-06-25 NOTE — Progress Notes (Signed)
Patient presents to clinic today c/o cough, nasal congestion, PND and some mild fatigue over the past 2 days.  Denies fever, chills, aches, sinus pain, shortness of breath or chest tightness.  Denies recent travel or sick contact.  Patient also requesting refill of her Norco for chronic low back pain.  Has a referral to Neurosurgery placed in system but has not been able to have her New Patient appointment yet.  Denies radiation of pain at present.  Denies numbness or change to bowel or bladder habits.  Past Medical History  Diagnosis Date  . Measles as a child  . Mumps as a child  . Hypertension   . Tobacco abuse     No current outpatient prescriptions on file prior to visit.   No current facility-administered medications on file prior to visit.    No Known Allergies  Family History  Problem Relation Age of Onset  . Hypertension Mother   . Diabetes Father   . Hypertension Brother   . HIV Brother     History   Social History  . Marital Status: Single    Spouse Name: N/A    Number of Children: N/A  . Years of Education: N/A   Social History Main Topics  . Smoking status: Current Some Day Smoker -- 0.50 packs/day    Types: Cigarettes    Last Attempt to Quit: 03/22/2013  . Smokeless tobacco: Never Used     Comment: 1/4 pack per day  . Alcohol Use: No  . Drug Use: Yes     Comment: marijuana   . Sexual Activity: No   Other Topics Concern  . None   Social History Narrative   Denies hx of drug use   Single   1 daughter age 71 lives with daughter and grandson who is 59.   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    Review of Systems - See HPI.  All other ROS are negative.  BP 140/95 mmHg  Pulse 81  Temp(Src) 98.2 F (36.8 C) (Oral)  Resp 14  Ht 5\' 5"  (1.651 m)  Wt 84 lb 8 oz (38.329 kg)  BMI 14.06 kg/m2  SpO2 100%  Physical Exam  Constitutional: She is oriented to person, place, and time and well-developed,  well-nourished, and in no distress.  HENT:  Head: Normocephalic and atraumatic.  Right Ear: External ear normal.  Left Ear: External ear normal.  Nose: Nose normal.  Mouth/Throat: Oropharynx is clear and moist. No oropharyngeal exudate.  Eyes: Conjunctivae are normal. Pupils are equal, round, and reactive to light.  Neck: Neck supple.  Cardiovascular: Normal rate, regular rhythm, normal heart sounds and intact distal pulses.   Pulmonary/Chest: Effort normal and breath sounds normal. No respiratory distress. She has no wheezes. She has no rales. She exhibits no tenderness.  Lymphadenopathy:    She has no cervical adenopathy.  Neurological: She is alert and oriented to person, place, and time.  Skin: Skin is warm and dry. No rash noted.  Psychiatric: Affect normal.  Vitals reviewed.   No results found for this or any previous visit (from the past 2160 hour(s)).  Assessment/Plan: Degenerative disc disease, lumbar Medication refilled x 1.  Supportive measures discussed with patient.  Patient given number to Neurosurgeon she was referred to.  Has been instructed to call to set up her own appointment.  No further narcotic medication refills will come from this provider.  Will be up to discretion of  her PCP or her specialist.  URI (upper respiratory infection) Suspect viral etiology giving short duration of symptoms and unremarkable examination.  Increase fluids.  Rest.  Saline nasal spray.  Coricidin HBP.  Humidifier in bedroom. Return precautions discussed with patient.

## 2014-06-25 NOTE — Progress Notes (Signed)
Pre visit review using our clinic review tool, if applicable. No additional management support is needed unless otherwise documented below in the visit note/SLS  

## 2014-06-25 NOTE — Patient Instructions (Signed)
Please resume medications as directed for blood pressure. Limit your salt intake.  Schedule an appointment for follow-up of a Physical with Donna Curry.  Further refills will need to come from her.  For the respiratory symptoms -- These seem viral in nature.  Increase your fluid intake.  Get plenty of rest.  Use Plain Mucinex and Coricedin HBP for symptoms.  Place a humidifier in the bedroom. STOP SMOKING!.  For the back -- you need to call to set up an appointment with your neurosurgeon.  The number is [phone].  Take pain medication as directed.  No further refills will be given unless granted by your primary care doctor.

## 2014-06-28 NOTE — Assessment & Plan Note (Signed)
Suspect viral etiology giving short duration of symptoms and unremarkable examination.  Increase fluids.  Rest.  Saline nasal spray.  Coricidin HBP.  Humidifier in bedroom. Return precautions discussed with patient.

## 2014-06-28 NOTE — Assessment & Plan Note (Signed)
Medication refilled x 1.  Supportive measures discussed with patient.  Patient given number to Neurosurgeon she was referred to.  Has been instructed to call to set up her own appointment.  No further narcotic medication refills will come from this provider.  Will be up to discretion of her PCP or her specialist.

## 2015-01-13 ENCOUNTER — Telehealth: Payer: Self-pay | Admitting: *Deleted

## 2015-01-13 NOTE — Telephone Encounter (Signed)
Called and [facility] @ 2:24pm @ [phone]) asking the pt to RTC regarding update for Mammogram.//AB/CMA

## 2015-02-06 NOTE — Telephone Encounter (Signed)
Unable to reach pt by phone.//AB/CMA

## 2015-07-24 ENCOUNTER — Telehealth: Payer: Self-pay | Admitting: Medical

## 2015-07-24 ENCOUNTER — Emergency Department (HOSPITAL_BASED_OUTPATIENT_CLINIC_OR_DEPARTMENT_OTHER)
Admission: EM | Admit: 2015-07-24 | Discharge: 2015-07-24 | Disposition: A | Discharge status: Home / Self Care | Payer: BC Managed Care – PPO | Attending: Emergency Medicine | Admitting: Emergency Medicine

## 2015-07-24 ENCOUNTER — Ambulatory Visit (HOSPITAL_BASED_OUTPATIENT_CLINIC_OR_DEPARTMENT_OTHER)
Admission: RE | Admit: 2015-07-24 | Discharge: 2015-07-24 | Disposition: A | Discharge status: Home / Self Care | Payer: BC Managed Care – PPO | Source: Ambulatory Visit | Attending: Medical | Admitting: Medical

## 2015-07-24 ENCOUNTER — Ambulatory Visit (INDEPENDENT_AMBULATORY_CARE_PROVIDER_SITE_OTHER): Payer: BC Managed Care – PPO | Admitting: Medical

## 2015-07-24 ENCOUNTER — Emergency Department (HOSPITAL_BASED_OUTPATIENT_CLINIC_OR_DEPARTMENT_OTHER): Payer: BC Managed Care – PPO

## 2015-07-24 ENCOUNTER — Encounter (HOSPITAL_BASED_OUTPATIENT_CLINIC_OR_DEPARTMENT_OTHER): Payer: Self-pay | Admitting: *Deleted

## 2015-07-24 ENCOUNTER — Encounter: Payer: Self-pay | Admitting: Medical

## 2015-07-24 VITALS — BP 149/93 | HR 101 | Temp 97.5°F | Ht 65.0 in | Wt 78.2 lb

## 2015-07-24 DIAGNOSIS — R0781 Pleurodynia: Secondary | ICD-10-CM

## 2015-07-24 DIAGNOSIS — R05 Cough: Secondary | ICD-10-CM

## 2015-07-24 DIAGNOSIS — R918 Other nonspecific abnormal finding of lung field: Secondary | ICD-10-CM | POA: Insufficient documentation

## 2015-07-24 DIAGNOSIS — Z8619 Personal history of other infectious and parasitic diseases: Secondary | ICD-10-CM | POA: Insufficient documentation

## 2015-07-24 DIAGNOSIS — F17210 Nicotine dependence, cigarettes, uncomplicated: Secondary | ICD-10-CM | POA: Diagnosis not present

## 2015-07-24 DIAGNOSIS — R059 Cough, unspecified: Secondary | ICD-10-CM

## 2015-07-24 DIAGNOSIS — R6883 Chills (without fever): Secondary | ICD-10-CM | POA: Diagnosis not present

## 2015-07-24 DIAGNOSIS — J159 Unspecified bacterial pneumonia: Secondary | ICD-10-CM | POA: Diagnosis not present

## 2015-07-24 DIAGNOSIS — M791 Myalgia: Secondary | ICD-10-CM | POA: Diagnosis not present

## 2015-07-24 DIAGNOSIS — J189 Pneumonia, unspecified organism: Secondary | ICD-10-CM

## 2015-07-24 DIAGNOSIS — I10 Essential (primary) hypertension: Secondary | ICD-10-CM | POA: Insufficient documentation

## 2015-07-24 DIAGNOSIS — R5383 Other fatigue: Secondary | ICD-10-CM

## 2015-07-24 DIAGNOSIS — J690 Pneumonitis due to inhalation of food and vomit: Secondary | ICD-10-CM

## 2015-07-24 DIAGNOSIS — R8290 Unspecified abnormal findings in urine: Secondary | ICD-10-CM

## 2015-07-24 DIAGNOSIS — F17200 Nicotine dependence, unspecified, uncomplicated: Secondary | ICD-10-CM

## 2015-07-24 DIAGNOSIS — M5136 Other intervertebral disc degeneration, lumbar region: Secondary | ICD-10-CM

## 2015-07-24 LAB — BASIC METABOLIC PANEL
Anion gap: 9 (ref 5–15)
BUN: 29 mg/dL — ABNORMAL HIGH (ref 6–20)
CO2: 25 mmol/L (ref 22–32)
Calcium: 8.7 mg/dL — ABNORMAL LOW (ref 8.9–10.3)
Chloride: 102 mmol/L (ref 101–111)
Creatinine, Ser: 1.13 mg/dL — ABNORMAL HIGH (ref 0.44–1.00)
GFR calc Af Amer: >60 mL/min (ref >60)
GFR calc non Af Amer: 52 mL/min — ABNORMAL LOW (ref >60)
Glucose, Bld: 115 mg/dL — ABNORMAL HIGH (ref 65–99)
Potassium: 3.2 mmol/L — ABNORMAL LOW (ref 3.5–5.1)
Sodium: 136 mmol/L (ref 135–145)

## 2015-07-24 LAB — CBC WITH DIFFERENTIAL/PLATELET
Band Neutrophils: 0 %
Basophils Absolute: 0 10*3/uL (ref 0.0–0.1)
Basophils Absolute: 0 10*3/uL (ref 0.0–0.1)
Basophils Relative: 0 % (ref 0.0–3.0)
Basophils Relative: 0 %
Blasts: 0 %
Eosinophils Absolute: 0 10*3/uL (ref 0.0–0.7)
Eosinophils Absolute: 0 10*3/uL (ref 0.0–0.7)
Eosinophils Relative: 0 % (ref 0.0–5.0)
Eosinophils Relative: 0 %
HCT: 36.4 % (ref 36.0–46.0)
HCT: 33.8 % — ABNORMAL LOW (ref 36.0–46.0)
Hemoglobin: 12.1 g/dL (ref 12.0–15.0)
Hemoglobin: 11.9 g/dL — ABNORMAL LOW (ref 12.0–15.0)
Lymphocytes Relative: 12.2 % (ref 12.0–46.0)
Lymphocytes Relative: 14 %
Lymphs Abs: 2.2 10*3/uL (ref 0.7–4.0)
Lymphs Abs: 1.9 10*3/uL (ref 0.7–4.0)
MCH: 30.2 pg (ref 26.0–34.0)
MCHC: 33.3 g/dL (ref 30.0–36.0)
MCHC: 35.2 g/dL (ref 30.0–36.0)
MCV: 91.2 fl (ref 78.0–100.0)
MCV: 85.8 fL (ref 78.0–100.0)
Metamyelocytes Relative: 0 %
Monocytes Absolute: 1 10*3/uL (ref 0.1–1.0)
Monocytes Absolute: 1.6 10*3/uL — ABNORMAL HIGH (ref 0.1–1.0)
Monocytes Relative: 5.5 % (ref 3.0–12.0)
Monocytes Relative: 12 %
Myelocytes: 0 %
Neutro Abs: 14.9 10*3/uL — ABNORMAL HIGH (ref 1.4–7.7)
Neutro Abs: 9.9 10*3/uL — ABNORMAL HIGH (ref 1.7–7.7)
Neutrophils Relative %: 82.3 % — ABNORMAL HIGH (ref 43.0–77.0)
Neutrophils Relative %: 74 %
Other: 0 %
Platelets: 326 10*3/uL (ref 150.0–400.0)
Platelets: 329 10*3/uL (ref 150–400)
Promyelocytes Absolute: 0 %
RBC: 3.99 Mil/uL (ref 3.87–5.11)
RBC: 3.94 MIL/uL (ref 3.87–5.11)
RDW: 16.9 % — ABNORMAL HIGH (ref 11.5–15.5)
RDW: 15.6 % — ABNORMAL HIGH (ref 11.5–15.5)
WBC: 18.1 10*3/uL (ref 4.0–10.5)
WBC: 13.4 10*3/uL — ABNORMAL HIGH (ref 4.0–10.5)
nRBC: 0 /100 WBC

## 2015-07-24 LAB — POCT URINALYSIS DIPSTICK
Bilirubin, UA: NEGATIVE
Glucose, UA: NEGATIVE
Ketones, UA: NEGATIVE
Nitrite, UA: NEGATIVE
Protein, UA: NEGATIVE
Spec Grav, UA: 1.02
Urobilinogen, UA: 0.2
pH, UA: 6

## 2015-07-24 LAB — COMPREHENSIVE METABOLIC PANEL
ALT: 27 U/L (ref 0–35)
AST: 27 U/L (ref 0–37)
Albumin: 3.2 g/dL — ABNORMAL LOW (ref 3.5–5.2)
Alkaline Phosphatase: 92 U/L (ref 39–117)
BUN: 29 mg/dL — ABNORMAL HIGH (ref 6–23)
CO2: 25 mEq/L (ref 19–32)
Calcium: 9.4 mg/dL (ref 8.4–10.5)
Chloride: 97 mEq/L (ref 96–112)
Creatinine, Ser: 1.13 mg/dL (ref 0.40–1.20)
GFR: 63.28 mL/min (ref >60.00)
Glucose, Bld: 139 mg/dL — ABNORMAL HIGH (ref 70–99)
Potassium: 3.4 mEq/L — ABNORMAL LOW (ref 3.5–5.1)
Sodium: 132 mEq/L — ABNORMAL LOW (ref 135–145)
Total Bilirubin: 0.4 mg/dL (ref 0.2–1.2)
Total Protein: 7.9 g/dL (ref 6.0–8.3)

## 2015-07-24 LAB — POCT INFLUENZA A/B: Influenza A, POC: NEGATIVE

## 2015-07-24 MED ORDER — CEPHALEXIN 500 MG PO CAPS: 500.0000 mg | ORAL_CAPSULE | Freq: Two times a day (BID) | ORAL | Status: DC

## 2015-07-24 MED ORDER — IOHEXOL 300 MG/ML  SOLN
50.0000 mL | Freq: Once | INTRAMUSCULAR | Status: AC | PRN
  Administered 2015-07-24: 50 mL via INTRAVENOUS

## 2015-07-24 MED ORDER — POTASSIUM CHLORIDE CRYS ER 20 MEQ PO TBCR
40.0000 meq | EXTENDED_RELEASE_TABLET | Freq: Once | ORAL | Status: AC
  Administered 2015-07-24: 40 meq via ORAL
  Filled 2015-07-24: qty 2

## 2015-07-24 MED ORDER — SODIUM CHLORIDE 0.9 % IV BOLUS (SEPSIS)
500.0000 mL | Freq: Once | INTRAVENOUS | Status: AC
  Administered 2015-07-24: 500 mL via INTRAVENOUS

## 2015-07-24 MED ORDER — DEXTROSE 5 % IV SOLN
500.0000 mg | Freq: Once | INTRAVENOUS | Status: AC
  Administered 2015-07-24: 500 mg via INTRAVENOUS

## 2015-07-24 MED ORDER — HYDROCODONE-ACETAMINOPHEN 5-325 MG PO TABS: 1.0000 | ORAL_TABLET | Freq: Four times a day (QID) | ORAL | Status: DC | PRN

## 2015-07-24 MED ORDER — CEFTRIAXONE SODIUM 1 G IJ SOLR
INTRAMUSCULAR | Status: AC
  Filled 2015-07-24: qty 10

## 2015-07-24 MED ORDER — AZITHROMYCIN 250 MG PO TABS: 250.0000 mg | ORAL_TABLET | Freq: Every day | ORAL | Status: DC

## 2015-07-24 MED ORDER — LISINOPRIL-HYDROCHLOROTHIAZIDE 20-12.5 MG PO TABS: 1.0000 | ORAL_TABLET | Freq: Two times a day (BID) | ORAL | Status: DC

## 2015-07-24 MED ORDER — CEFTRIAXONE SODIUM 1 G IJ SOLR
1.0000 g | Freq: Once | INTRAMUSCULAR | Status: AC
  Administered 2015-07-24: 1 g via INTRAVENOUS

## 2015-07-24 MED ORDER — AZITHROMYCIN 500 MG IV SOLR
INTRAVENOUS | Status: DC
Start: 2015-07-24 — End: 2015-07-25
  Filled 2015-07-24: qty 500

## 2015-07-24 NOTE — Progress Notes (Signed)
Subjective:    Patient ID: Donna Curry, female    DOB: 1955/09/17, 59 y.o.   MRN: [id number]  HPI  Pt in with one week of decreased appetite. Not eating much. States has been eating soup, grits and oranges. Not eating large meals. Today she states just got back  appetite. She state desire to eat pork chop or biscuit. Pt had no fever. But faint mild chill and sweats at times. Pt thinks maybe hot flashes.   Pt has no nausea and no vomiting. No uti symptoms. No diarrhea.  Also some fatigue for about one week. One week ago when she felt tired state felt mild achy. But diffusse achiness subsided days ago  Some rt lower rib pain but no falls over past 4-5 days She does note that since she has been sick has been coughing intermittently.    Review of Systems  Constitutional: Positive for chills, diaphoresis and fatigue. Negative for fever.  HENT: Negative for congestion, ear discharge, ear pain, nosebleeds, postnasal drip, rhinorrhea and sinus pressure.   Respiratory: Positive for cough. Negative for chest tightness and shortness of breath.        Rare cough  Cardiovascular: Negative for chest pain and palpitations.  Gastrointestinal: Negative for nausea, abdominal pain, diarrhea, constipation, blood in stool and abdominal distention.  Genitourinary: Negative for dysuria, urgency, frequency and flank pain.  Musculoskeletal: Negative for back pain.       Rib pain.  Skin: Positive for rash.  Neurological: Negative for dizziness, syncope, weakness and headaches.  Hematological: Negative for adenopathy. Does not bruise/bleed easily.  Psychiatric/Behavioral: Negative for behavioral problems and confusion.   Past Medical History  Diagnosis Date  . Measles as a child  . Mumps as a child  . Hypertension   . Tobacco abuse     Social History   Social History  . Marital Status: Single    Spouse Name: N/A  . Number of Children: N/A  . Years of Education: N/A   Occupational History  .  Not on file.   Social History Main Topics  . Smoking status: Current Some Day Smoker -- 0.50 packs/day    Types: Cigarettes    Last Attempt to Quit: 03/22/2013  . Smokeless tobacco: Never Used     Comment: 1/4 pack per day,Hasnt smoked in 8 days  . Alcohol Use: No  . Drug Use: Yes     Comment: marijuana   . Sexual Activity: No   Other Topics Concern  . Not on file   Social History Narrative   Denies hx of drug use   Single   1 daughter age 21 lives with daughter and grandson who is 13.   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    Past Surgical History  Procedure Laterality Date  . Tubes tied    . Uterine ablation      about 2005    Family History  Problem Relation Age of Onset  . Hypertension Mother   . Diabetes Father   . Hypertension Brother   . HIV Brother     No Known Allergies  Current Outpatient Prescriptions on File Prior to Visit  Medication Sig Dispense Refill  . HYDROcodone-acetaminophen (NORCO/VICODIN) 5-325 MG per tablet Take 1 tablet by mouth every 6 (six) hours as needed for moderate pain. 30 tablet 0  . lisinopril-hydrochlorothiazide (PRINZIDE,ZESTORETIC) 20-12.5 MG per tablet Take 1 tablet by mouth 2 (two) times daily. 60 tablet 1  No current facility-administered medications on file prior to visit.    BP 149/93 mmHg  Pulse 101  Temp(Src) 97.5 F (36.4 C) (Oral)  Ht '5\' 5"'$  (1.651 m)  Wt 78 lb 3.2 oz (35.471 kg)  BMI 13.01 kg/m2  SpO2 98%       Objective:   Physical Exam   General  Mental Status - Alert. General Appearance - Well groomed. Not in acute distress.  Skin Rashes- No Rashes.  HEENT Head- Normal. Ear Auditory Canal - Left- Normal. Right - Normal.Tympanic Membrane- Left- Normal. Right- Normal. Eye Sclera/Conjunctiva- Left- Normal. Right- Normal. Nose & Sinuses Nasal Mucosa- Left-  Not boggy or Congested. Right-  Not  boggy or Congested. Mouth & Throat Lips: Upper Lip- Normal: no  dryness, cracking, pallor, cyanosis, or vesicular eruption. Lower Lip-Normal: no dryness, cracking, pallor, cyanosis or vesicular eruption. Buccal Mucosa- Bilateral- No Aphthous ulcers. Oropharynx- No Discharge or Erythema. Tonsils: Characteristics- Bilateral- No Erythema or Congestion. Size/Enlargement- Bilateral- No enlargement. Discharge- bilateral-None.  Neck Neck- Supple. No Masses.   Chest and Lung Exam Auscultation: Breath Sounds:- even and unlabored, but shallow. Mild rib pain on palpation both anterior and posterior aspect.  Cardiovascular Auscultation:Rythm- Regular, rate and rhythm. Murmurs & Other Heart Sounds:Ausculatation of the heart reveal- No Murmurs.  Lymphatic Head & Neck General Head & Neck Lymphatics: Bilateral: Description- No Localized lymphadenopathy.   Abdomen Inspection:-Inspection Normal.  Palpation/Perucssion: Palpation and Percussion of the abdomen reveal- Non Tender, No Rebound tenderness, No rigidity(Guarding) and No Palpable abdominal masses.  Liver:-Normal.  Spleen:- Normal.   Back- no cva area pain.  Rt side lower anterior and posterior ribs mild tender to palpation.        Assessment & Plan:  I want you to hydrate well and start to eat. Otherwise may need IV fluids.  Will rx cephalexin antibiotic to cover for bronchitis. This antibiotic covers urine infection fairly well also.   For fatigue will get cbc and cmp.  Urine culture and folllow up in 7 days for repeat ua for blood.  Get cxr today to evaluate any pneumonia and evaluate rt ribs.  Follow up in 7 days or as needed(if symptoms worsen over weekend then ED)

## 2015-07-24 NOTE — ED Notes (Signed)
MD at bedside. 

## 2015-07-24 NOTE — Progress Notes (Signed)
Pre visit review using our clinic review tool, if applicable. No additional management support is needed unless otherwise documented below in the visit note. 

## 2015-07-24 NOTE — Telephone Encounter (Signed)
After getting pt cbc back today and seeing wbc elevation of 18. I decided to send pt to ED. She may benefit from iv antibiotics and hydration. Also could get ct of the chest to determine if has cancer or if has pneumonia.  Early in day was considering getting ct as outpatient but now that I know wbc count thought best to do above. I called patient and informed her to go to ED in our building now. Also talked to MD in our ED and informed of pt presentation and history.

## 2015-07-24 NOTE — Patient Instructions (Addendum)
I want you to hydrate well and start to eat. Otherwise may need IV fluids.  Will rx cephalexin antibiotic to cover for bronchitis. This antibiotic covers urine infection fairly well also.   For fatigue will get cbc and cmp.  Leukocytes in urine today so will get culture.  Urine culture and folllow up in 7 days for repeat ua for blood.  Get cxr today to evaluate any pneumonia and evaluate rt ribs.  Follow up in 7 days or as needed(if symptoms worsen over weekend then ED)  Refill of her bp medication. Also limited refill of her pain medication until she comes back in. Her pcp not her today.

## 2015-07-24 NOTE — ED Provider Notes (Signed)
CSN: [id number]     Arrival date & time 07/24/15  1626 History  By signing my name below, I, Emmanuella Mensah, attest that this documentation has been prepared under the direction and in the presence of Pattricia Boss, MD. Electronically Signed: Judithann Sauger, ED Scribe. 07/24/2015. 6:43 PM.    Chief Complaint  Patient presents with  . Cough   Patient is a 59 y.o. female presenting with cough. The history is provided by the patient. No language interpreter was used.  Cough Cough characteristics:  Non-productive Severity:  Moderate Onset quality:  Gradual Duration:  9 days Timing:  Intermittent Progression:  Worsening Chronicity:  New Smoker: no (Quit 8 days ago)   Relieved by:  None tried Worsened by:  Nothing tried Ineffective treatments:  None tried Associated symptoms: chills and myalgias   Associated symptoms: no chest pain and no fever    HPI Comments: Donna Curry is a 59 y.o. female with a hx of HTN who presents to the Emergency Department complaining of partially improving moderate intermittent non-productive cough onset 9 days ago. She reports associated chills, soreness to her right rib under her breast, and decreased appetite. She notes an unexpected weight loss of approx. 5 lbs since her last PCP appointment. She states that she went to see her PCP today for a check up and for a refill of her HTN medication. Pt was advised by her PCP to come here for IV fluids due to dehydration.  She states that she quit smoking 8 days ago. She denies any fever, SOB, or CP. CXR done in pmd office with question of mass vs infiltrate.  Primary care called to ed to discuss patient management and sent her downstairs for further evaluation.   Past Medical History  Diagnosis Date  . Measles as a child  . Mumps as a child  . Hypertension   . Tobacco abuse    Past Surgical History  Procedure Laterality Date  . Tubes tied    . Uterine ablation      about 2005   Family History   Problem Relation Age of Onset  . Hypertension Mother   . Diabetes Father   . Hypertension Brother   . HIV Brother    Social History  Substance Use Topics  . Smoking status: Current Some Day Smoker -- 0.50 packs/day    Types: Cigarettes    Last Attempt to Quit: 03/22/2013  . Smokeless tobacco: Never Used     Comment: 1/4 pack per day,Hasnt smoked in 8 days  . Alcohol Use: No   OB History    No data available     Review of Systems  Constitutional: Positive for chills, appetite change and unexpected weight change. Negative for fever.  Respiratory: Positive for cough.   Cardiovascular: Negative for chest pain.  Musculoskeletal: Positive for myalgias.  All other systems reviewed and are negative.     Allergies  Review of patient's allergies indicates no known allergies.  Home Medications   Prior to Admission medications   Medication Sig Start Date End Date Taking? Authorizing Provider  cephALEXin (KEFLEX) 500 MG capsule Take 1 capsule (500 mg total) by mouth 2 (two) times daily. 07/24/15   Percell Miller Saguier, PA-C  HYDROcodone-acetaminophen (NORCO/VICODIN) 5-325 MG tablet Take 1 tablet by mouth every 6 (six) hours as needed for moderate pain. 07/24/15   Percell Miller Saguier, PA-C  lisinopril-hydrochlorothiazide (PRINZIDE,ZESTORETIC) 20-12.5 MG tablet Take 1 tablet by mouth 2 (two) times daily. 07/24/15   Mackie Pai, PA-C  BP 144/99 mmHg  Pulse 86  Temp(Src) 98.2 F (36.8 C) (Oral)  Resp 16  Ht '5\' 5"'$  (1.651 m)  Wt 78 lb (35.381 kg)  BMI 12.98 kg/m2  SpO2 99% Physical Exam  Thin female nad Heent- normal, mucous membranes dry,external ears normal, nares normal, eyes eom intact Neck- no jvd or adenopathy Chest wall- no masses Lungs- decreased bs on left, no wheezing Abdomen soft nttp Extremities- thin, normal A and o x 3- no focal neuro deficits Psych - normal  ED Course  Procedures (including critical care time) DIAGNOSTIC STUDIES: Oxygen Saturation is 96% on RA,  adequate by my interpretation.    COORDINATION OF CARE: 6:24 PM- Pt advised of plan for treatment and pt agrees. Pt will receive an x-Leonna Schlee. She will also receive rocephin, azithromycin, and IV fluids.    Labs Review Labs Reviewed  CBC WITH DIFFERENTIAL/PLATELET  BASIC METABOLIC PANEL    Imaging Review Dg Ribs Unilateral W/chest Right  07/24/2015  ADDENDUM REPORT: 07/24/2015 13:49 ADDENDUM: Study discussed by telephone with Provider Mackie Pai on 07/24/2015 at 1345 hours. Electronically Signed   By: Genevie Ann M.D.   On: 07/24/2015 13:49  07/24/2015  CLINICAL DATA:  59 year old female with right side rib pain for 1 week with no known injury. Cough. Initial encounter. EXAM: RIGHT RIBS AND CHEST - 3+ VIEW COMPARISON:  CT Abdomen and Pelvis 02/01/2012 FINDINGS: Hyperinflation. Confluent abnormal pulmonary opacity in the right lung encompassing 9 cm diameter and inseparable from the right hilum. The right upper lobe is spared. No pneumothorax, pulmonary edema, or pleural effusion. Right peritracheal contour remains normal. Calcified aortic atherosclerosis. Left mediastinal contours are within normal limits. Right rib marker placed at the anterior lateral right seventh rib level, corresponding to the region of the abnormal lung parenchyma. No displaced rib fracture identified. No destructive osseous lesion identified. Scoliosis and degenerative changes in the lumbar spine. IMPRESSION: 1. Abnormal right lung middle and/or lower lobe opacity might represent pneumonia but is more suspicious for bronchogenic carcinoma in this clinical setting. Recommend chest CT (IV contrast preferred) to further characterize. 2. No right pleural effusion. No right displaced rib fracture identified. Electronically Signed: By: Genevie Ann M.D. On: 07/24/2015 13:28   Ct Chest W Contrast  07/24/2015  CLINICAL DATA:  Initial encounter for cough and right-sided chest pain for 8 days. EXAM: CT CHEST WITH CONTRAST TECHNIQUE:  Multidetector CT imaging of the chest was performed during intravenous contrast administration. CONTRAST:  30m OMNIPAQUE IOHEXOL 300 MG/ML  SOLN COMPARISON:  Abdomen and pelvis CT from 03/30/2013 and 02/01/2012. FINDINGS: Mediastinum / Lymph Nodes: There is no axillary lymphadenopathy. No mediastinal lymphadenopathy. No left hilar lymphadenopathy. 15 mm short axis right hilar lymph node evident. The heart size is normal. No pericardial effusion. Coronary artery calcification is noted. The esophagus has normal imaging features. Lungs / Pleura: Lung windows demonstrate moderate emphysema. Dense airspace consolidation is identified in the right middle lobe extending from the peripheral pleura centrally into the right hilum. There is some subtle micro nodularity in the posterior lingula with peribronchovascular distribution suggesting atypical infection. No pulmonary edema or pleural effusion. Upper Abdomen: Left adrenal mass measures 16 x 33 mm today compared to 15 x 26 mm on a study from 03/30/2013 and 14 x 31 mm on 02/01/2012. MSK / Soft Tissues: Bone windows reveal no worrisome lytic or sclerotic osseous lesions. IMPRESSION: Diffuse airspace disease in the right middle lobe with associated air bronchograms. Mild right hilar lymphadenopathy associated. This may be related  to right middle lobe pneumonia in the appropriate clinical setting. Bronchogenic neoplasm is considered less likely but not completely excluded. Close follow-up will be required. 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 Previous imaging (03/30/2013) documented stability back to 2003. Electronically Signed   By: Misty Stanley M.D.   On: 07/24/2015 19:55   Pattricia Boss, MD has personally reviewed and evaluated these images and lab results as part of her medical decision-making.  MDM   Final diagnoses:  CAP (community acquired pneumonia)    59 year old female sent from primary care office with probable pneumonia versus neoplasm. She had CT obtained here if that is significant for right lower lobe pneumonia. She is treated here with IV Rocephin and Zithromax. She has an elevated white blood cell count consistent with infection. She has recently stopped smoking. She feels improved here and wishes to be discharged. I have discussed return precautions and need for close follow-up with her and she voices understanding. She is put on a five-day course of Zithromax. She will require follow-up imaging to assure clearance.  Pattricia Boss, MD 07/25/15 [phone]

## 2015-07-24 NOTE — ED Notes (Signed)
Reports cough x 1-2 weeks- Seen at MD today for labs and chest xray- States she was advised to come here for IVF for dehydration

## 2015-07-24 NOTE — Discharge Instructions (Signed)

## 2015-07-24 NOTE — ED Notes (Addendum)
Cough for a week. Her right ribs are sore from coughing so much. She was seen by her Dr this am and had a CXR and lab work. The office called her this afternoon and told her to come to the ED for possible dehydration.

## 2015-07-24 NOTE — Telephone Encounter (Signed)
Ct chest order placed.

## 2015-07-25 LAB — URINE CULTURE
Colony Count: NO GROWTH
Organism ID, Bacteria: NO GROWTH

## 2015-07-27 ENCOUNTER — Telehealth: Payer: Self-pay | Admitting: Family

## 2015-07-27 ENCOUNTER — Telehealth: Payer: Self-pay | Admitting: Medical

## 2015-07-27 NOTE — Telephone Encounter (Signed)
Opened in error. Pt has follow up scheduled already.

## 2015-07-27 NOTE — Telephone Encounter (Signed)
Pt had presentation last week which may have indicated pneumonia but not completely clear. Initially radiologist had concern for pneumonia vs cancer. But he recommended getting CT of thorax stat. Since pt white count was elevated sent to ED. Ct of thorax indicated likely pnuemonia but could not completely exclude bronchogenic carcinoma. Pt given treatment for pneumonia. She got rocephin and azithromycin. Would like pt seen by pcp in 10-14 days. If pt can't be seen by pcp then I want to see her. I think she needs chest xray repeat after antibiotic treatment. If radiologist still reading any question regarding potential for cancer then may need repeat CT.

## 2015-07-28 NOTE — Telephone Encounter (Signed)
Pt has apt with me on 1/9.  Please advise pt to keep this appointment.

## 2015-07-28 NOTE — Telephone Encounter (Signed)
Pt was advised to keep appointment on 08/02/14. Pt will call back if changes need to be made for the appointment. She states that she will have to arrange transportation with her daughter.

## 2015-08-03 ENCOUNTER — Encounter: Payer: Self-pay | Admitting: Family

## 2015-08-03 ENCOUNTER — Ambulatory Visit (INDEPENDENT_AMBULATORY_CARE_PROVIDER_SITE_OTHER): Payer: BC Managed Care – PPO | Admitting: Family

## 2015-08-03 VITALS — BP 96/77 | HR 101 | Temp 98.3°F | Resp 16 | Ht 65.0 in | Wt 78.0 lb

## 2015-08-03 DIAGNOSIS — I10 Essential (primary) hypertension: Secondary | ICD-10-CM

## 2015-08-03 DIAGNOSIS — M5136 Other intervertebral disc degeneration, lumbar region: Secondary | ICD-10-CM

## 2015-08-03 DIAGNOSIS — J189 Pneumonia, unspecified organism: Secondary | ICD-10-CM

## 2015-08-03 DIAGNOSIS — R61 Generalized hyperhidrosis: Secondary | ICD-10-CM

## 2015-08-03 DIAGNOSIS — R634 Abnormal weight loss: Secondary | ICD-10-CM | POA: Diagnosis not present

## 2015-08-03 DIAGNOSIS — M51369 Other intervertebral disc degeneration, lumbar region without mention of lumbar back pain or lower extremity pain: Secondary | ICD-10-CM

## 2015-08-03 LAB — TSH: TSH: 1.47 u[IU]/mL (ref 0.35–4.50)

## 2015-08-03 MED ORDER — LISINOPRIL-HYDROCHLOROTHIAZIDE 20-12.5 MG PO TABS: 1.0000 | ORAL_TABLET | Freq: Every day | ORAL | Status: DC

## 2015-08-03 MED ORDER — MELOXICAM 7.5 MG PO TABS: 7.5000 mg | ORAL_TABLET | Freq: Every day | ORAL | Status: DC

## 2015-08-03 NOTE — Assessment & Plan Note (Signed)
Currently overtreated.  Change lisinopril hctz from bid to once daily.

## 2015-08-03 NOTE — Assessment & Plan Note (Signed)
Obtain TSH, encouraged 3 well balanced meals.  Likely related to recent acute illness. Plan 1 month follow up for re-assessment of weight.

## 2015-08-03 NOTE — Progress Notes (Signed)
Subjective:    Patient ID: Donna Curry, female    DOB: 10-14-1955, 60 y.o.   MRN: [id number]  HPI Donna Curry is a 60 yr old female who presents today for follow up.  1) HTN-  She reports that she had not taken BP med prior to last visit. Currently taking lisinopril-hctz bid.   BP Readings from Last 3 Encounters:  08/03/15 96/77  07/24/15 154/102  07/24/15 149/93   Wt Readings from Last 3 Encounters:  08/03/15 78 lb (35.381 kg)  07/24/15 78 lb (35.381 kg)  07/24/15 78 lb 3.2 oz (35.471 kg)   2) PNA- seen in ED on 07/24/15.  CT showed stable left adrenal mass.  Also noted Diffuse airspace disease in the right middle lobe with associated air bronchograms. Mild right hilar lymphadenopathy associated. Pt was treated with Zpak, (IM rocephin in the ED).  She reports cough is better. Denies fever.  She reports that she has hx of night sweats x years.  Night sweats are unchanged  3) Weight loss- reports that her weight goes up and down.  Patient and daughter feel that she lost most of her weight surrounding her pneumonia and that she is only just recently starting to get her appetite back.    Review of Systems   Reports low back pain radiates into the left hip/intermittent  Past Medical History  Diagnosis Date  . Measles as a child  . Mumps as a child  . Hypertension   . Tobacco abuse     Social History   Social History  . Marital Status: Single    Spouse Name: N/A  . Number of Children: N/A  . Years of Education: N/A   Occupational History  . Not on file.   Social History Main Topics  . Smoking status: Current Some Day Smoker -- 0.50 packs/day    Types: Cigarettes    Last Attempt to Quit: 03/22/2013  . Smokeless tobacco: Never Used     Comment: 1/4 pack per day,Hasnt smoked in 8 days  . Alcohol Use: No  . Drug Use: Yes     Comment: marijuana   . Sexual Activity: No   Other Topics Concern  . Not on file   Social History Narrative   Denies hx of drug use   Single   1 daughter age 32 lives with daughter and grandson who is 87.   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    Past Surgical History  Procedure Laterality Date  . Tubes tied    . Uterine ablation      about 2005    Family History  Problem Relation Age of Onset  . Hypertension Mother   . Diabetes Father   . Hypertension Brother   . HIV Brother     No Known Allergies  No current outpatient prescriptions on file prior to visit.   No current facility-administered medications on file prior to visit.    BP 96/77 mmHg  Pulse 101  Temp(Src) 98.3 F (36.8 C) (Oral)  Resp 16  Ht '5\' 5"'$  (1.651 m)  Wt 78 lb (35.381 kg)  BMI 12.98 kg/m2  SpO2 99%       Objective:   Physical Exam  Constitutional: She is oriented to person, place, and time. She appears well-developed and well-nourished.  HENT:  Head: Normocephalic and atraumatic.  Cachectic AA female NAD.   Cardiovascular: Normal rate and regular rhythm.   No murmur heard.  Pulmonary/Chest: Effort normal. No respiratory distress. She has no wheezes. She has no rales. She exhibits no tenderness.  Diminished breath sounds throughout  Musculoskeletal: She exhibits no edema.  Neurological: She is alert and oriented to person, place, and time.  Skin: Skin is warm.  Psychiatric: She has a normal mood and affect. Her behavior is normal. Judgment and thought content normal.          Assessment & Plan:  CAP- clinically resolved. Plan follow CT in 1 month to ensure resolution of hilar LAD/airspace disease given smoking hx.   Night sweats- obtain TSH, TB gold, HIV to further evaluate.

## 2015-08-03 NOTE — Assessment & Plan Note (Signed)
Likely cause for LBP, trial of short course of meloxicam.

## 2015-08-03 NOTE — Progress Notes (Signed)
Pre visit review using our clinic review tool, if applicable. No additional management support is needed unless otherwise documented below in the visit note. 

## 2015-08-03 NOTE — Patient Instructions (Signed)
Complete lab work prior to leaving. You may use meloxicam once daily as needed for back/hip pain. Decrease lisinopril hctz to one tab once daily. Eat 3 well balance meals a day.

## 2015-08-04 LAB — HIV ANTIBODY (ROUTINE TESTING W REFLEX): HIV 1&2 Ab, 4th Generation: NONREACTIVE

## 2015-08-05 LAB — QUANTIFERON TB GOLD ASSAY (BLOOD)
Interferon Gamma Release Assay: NEGATIVE
Mitogen value: 6.51 IU/mL
Quantiferon Nil Value: 0.04 IU/mL
Quantiferon Tb Ag Minus Nil Value: 0 IU/mL
TB Ag value: 0.04 IU/mL

## 2015-08-06 ENCOUNTER — Encounter: Payer: Self-pay | Admitting: Family

## 2015-08-25 ENCOUNTER — Telehealth: Payer: Self-pay | Admitting: Behavioral Health

## 2015-08-25 NOTE — Telephone Encounter (Signed)
Assessed the following gaps in care: Mammogram Pap Colon Flu  Attempted to reach the patient. Unable to leave a message; per recording, this patient can not be contacted at this time.

## 2015-09-04 ENCOUNTER — Ambulatory Visit: Payer: BC Managed Care – PPO | Admitting: Family

## 2015-09-07 ENCOUNTER — Telehealth: Payer: Self-pay | Admitting: Family

## 2015-09-07 NOTE — Telephone Encounter (Signed)
Pt was no show 09/04/15 10:45am for follow up appt, pt has not rescheduled, 1st no show w/in 12 months, charge or no charge?

## 2015-09-07 NOTE — Telephone Encounter (Signed)
Yes please

## 2015-09-08 ENCOUNTER — Encounter: Payer: Self-pay | Admitting: Family

## 2015-09-08 NOTE — Telephone Encounter (Signed)
Marked to charge and mailing no show letter °

## 2015-09-28 ENCOUNTER — Telehealth: Payer: Self-pay | Admitting: Family

## 2015-09-28 NOTE — Telephone Encounter (Signed)
Recorded msg "The person you called is unavailable, please try your call again later"

## 2016-02-27 ENCOUNTER — Encounter (HOSPITAL_BASED_OUTPATIENT_CLINIC_OR_DEPARTMENT_OTHER): Payer: Self-pay | Admitting: *Deleted

## 2016-02-27 ENCOUNTER — Emergency Department (HOSPITAL_BASED_OUTPATIENT_CLINIC_OR_DEPARTMENT_OTHER)
Admission: EM | Admit: 2016-02-27 | Discharge: 2016-02-27 | Disposition: A | Discharge status: Home / Self Care | Payer: BC Managed Care – PPO | Attending: Emergency Medicine | Admitting: Emergency Medicine

## 2016-02-27 DIAGNOSIS — Z79899 Other long term (current) drug therapy: Secondary | ICD-10-CM | POA: Insufficient documentation

## 2016-02-27 DIAGNOSIS — M549 Dorsalgia, unspecified: Secondary | ICD-10-CM | POA: Insufficient documentation

## 2016-02-27 DIAGNOSIS — B029 Zoster without complications: Secondary | ICD-10-CM | POA: Insufficient documentation

## 2016-02-27 DIAGNOSIS — I10 Essential (primary) hypertension: Secondary | ICD-10-CM | POA: Insufficient documentation

## 2016-02-27 DIAGNOSIS — R109 Unspecified abdominal pain: Secondary | ICD-10-CM | POA: Insufficient documentation

## 2016-02-27 DIAGNOSIS — F17210 Nicotine dependence, cigarettes, uncomplicated: Secondary | ICD-10-CM | POA: Insufficient documentation

## 2016-02-27 MED ORDER — ACYCLOVIR 400 MG PO TABS: 400.0000 mg | ORAL_TABLET | Freq: Four times a day (QID) | ORAL | 0 refills | Status: DC

## 2016-02-27 MED ORDER — HYDROCODONE-ACETAMINOPHEN 5-325 MG PO TABS: 1.0000 | ORAL_TABLET | ORAL | 0 refills | Status: DC | PRN

## 2016-02-27 MED ORDER — GABAPENTIN 100 MG PO CAPS: 100.0000 mg | ORAL_CAPSULE | Freq: Three times a day (TID) | ORAL | 0 refills | Status: DC | PRN

## 2016-02-27 MED ORDER — HYDROCODONE-ACETAMINOPHEN 5-325 MG PO TABS
1.0000 | ORAL_TABLET | Freq: Once | ORAL | Status: AC
  Administered 2016-02-27: 1 via ORAL
  Filled 2016-02-27: qty 1

## 2016-02-27 MED ORDER — GABAPENTIN 600 MG PO TABS
300.0000 mg | ORAL_TABLET | Freq: Once | ORAL | Status: AC
  Administered 2016-02-27: 300 mg via ORAL
  Filled 2016-02-27: qty 1

## 2016-02-27 NOTE — ED Provider Notes (Signed)
[facility] Provider Note   CSN: [id number] Arrival date & time: 02/27/16  1815  By signing my name below, I, [name], attest that this documentation has been prepared under the direction and in the presence of Julianne Rice, MD. Electronically Signed: [name], ED Scribe. 02/27/16. 8:03 PM.  First MD Initiated Contact with Patient 02/27/16 1958    History   Chief Complaint Chief Complaint  Patient presents with  . Flank Pain   The history is provided by the patient and a friend. No language interpreter was used.    HPI Comments: Donna Curry is a 60 y.o. female who presents to the Emergency Department complaining of painful rash to right side onset 2 days. Pt states the rash feels as if someone is stabbing her in the back. Pt states the pain came before the rash appeared. Pt has not had similar symptoms before. Pt was seen recently at [facility] for similar symptoms. Pt was diagnosed with a Kidney Stone and was prescribed Hydrocodone and Flomax. Pt states she has been taking Vicodin for the pain. She denies any urinary problems, including hematuria, dysuria, urgency or frequency. Denies fevers or chills.  Past Medical History:  Diagnosis Date  . Hypertension   . Measles as a child  . Mumps as a child  . Tobacco abuse     Patient Active Problem List   Diagnosis Date Noted  . Dizziness and giddiness 04/12/2013  . Palpitations 03/27/2013  . Loss of weight 06/08/2012  . Degenerative disc disease, lumbar 01/30/2012  . Adrenal mass ([place]) 01/30/2012  . Hypercalcemia 01/30/2012  . Hyperproteinemia 01/30/2012  . Borderline diabetes 01/30/2012  . Normocytic anemia 03/11/2011  . TOBACCO ABUSE 08/31/2009  . Essential hypertension 08/31/2009    Past Surgical History:  Procedure Laterality Date  . tubes tied    . uterine ablation     about 2005    OB History    No data available      Home Medications    Prior to Admission medications   Medication Sig  Start Date End Date Taking? Authorizing Provider  acyclovir (ZOVIRAX) 400 MG tablet Take 1 tablet (400 mg total) by mouth 4 (four) times daily. 02/27/16   Julianne Rice, MD  gabapentin (NEURONTIN) 100 MG capsule Take 1 capsule (100 mg total) by mouth 3 (three) times daily as needed. 02/27/16   Julianne Rice, MD  HYDROcodone-acetaminophen (NORCO) 5-325 MG tablet Take 1 tablet by mouth every 4 (four) hours as needed for severe pain. 02/27/16   Julianne Rice, MD  lisinopril-hydrochlorothiazide (PRINZIDE,ZESTORETIC) 20-12.5 MG tablet Take 1 tablet by mouth daily. 08/03/15   [name], NP  meloxicam (MOBIC) 7.5 MG tablet Take 1 tablet (7.5 mg total) by mouth daily. 08/03/15   [name], NP    Family History Family History  Problem Relation Age of Onset  . Hypertension Mother   . Diabetes Father   . Hypertension Brother   . HIV Brother     Social History Social History  Substance Use Topics  . Smoking status: Current Some Day Smoker    Packs/day: 0.50    Types: Cigarettes    Last attempt to quit: 03/22/2013  . Smokeless tobacco: Never Used     Comment: 1/4 pack per day,Hasnt smoked in 8 days  . Alcohol use No     Allergies   Review of patient's allergies indicates no known allergies.   Review of Systems Review of Systems  Constitutional: Negative for chills and  fever.  Respiratory: Negative for shortness of breath.   Cardiovascular: Negative for chest pain.  Gastrointestinal: Negative for abdominal pain, diarrhea, nausea and vomiting.  Genitourinary: Positive for flank pain. Negative for dysuria, frequency, hematuria and urgency.  Musculoskeletal: Positive for back pain. Negative for neck pain and neck stiffness.  Skin: Positive for rash.  Neurological: Negative for dizziness, weakness, light-headedness, numbness and headaches.  All other systems reviewed and are negative.  Physical Exam Updated Vital Signs BP 157/96   Pulse 74   Temp 98.7 F (37.1 C) (Oral)    Resp 20   SpO2 100%   Physical Exam  Constitutional: She is oriented to person, place, and time. She appears well-developed and well-nourished.  HENT:  Head: Normocephalic and atraumatic.  Mouth/Throat: Oropharynx is clear and moist.  Eyes: EOM are normal. Pupils are equal, round, and reactive to light.  Neck: Normal range of motion. Neck supple.  Cardiovascular: Normal rate and regular rhythm.   Pulmonary/Chest: Effort normal and breath sounds normal.  Abdominal: Soft. Bowel sounds are normal. There is no tenderness. There is no rebound and no guarding.  Musculoskeletal: Normal range of motion. She exhibits no edema or tenderness.  No CVA tenderness bilaterally  Neurological: She is alert and oriented to person, place, and time.  Skin: Skin is warm and dry. Rash noted. No erythema.  Patient has vesicular rash in bandlike pattern extending from the midline lower thoracic region wrapping around to the midline of the right upper abdomen.  Psychiatric: She has a normal mood and affect. Her behavior is normal.  Nursing note and vitals reviewed.    ED Treatments / Results  DIAGNOSTIC STUDIES: Oxygen Saturation is 97% on RA, adequate by my interpretation.    COORDINATION OF CARE: 8:02 PM Discussed treatment plan with pt at bedside which includes Gabapentin, Hydrocodone-Acetaminophen, and Acyclovir and pt agreed to plan.  Labs (all labs ordered are listed, but only abnormal results are displayed) Labs Reviewed - No data to display  EKG  EKG Interpretation None       Radiology No results found.  Procedures Procedures (including critical care time)  Medications Ordered in ED Medications  HYDROcodone-acetaminophen (NORCO/VICODIN) 5-325 MG per tablet 1 tablet (1 tablet Oral Given 02/27/16 2052)  gabapentin (NEURONTIN) tablet 300 mg (300 mg Oral Given 02/27/16 2053)     Initial Impression / Assessment and Plan / ED Course  I have reviewed the triage vital signs and the  nursing notes.  Pertinent labs & imaging results that were available during my care of the patient were reviewed by me and considered in my medical decision making (see chart for details).  Clinical Course    Rash is clinically indicative of herpes zoster. Will start on antiviral medication and symptom control. Return precautions given.  Final Clinical Impressions(s) / ED Diagnoses   Final diagnoses:  Herpes zoster    New Prescriptions Discharge Medication List as of 02/27/2016  8:43 PM    START taking these medications   Details  acyclovir (ZOVIRAX) 400 MG tablet Take 1 tablet (400 mg total) by mouth 4 (four) times daily., Starting Sat 02/27/2016, Print    gabapentin (NEURONTIN) 100 MG capsule Take 1 capsule (100 mg total) by mouth 3 (three) times daily as needed., Starting Sat 02/27/2016, Print    HYDROcodone-acetaminophen (NORCO) 5-325 MG tablet Take 1 tablet by mouth every 4 (four) hours as needed for severe pain., Starting Sat 02/27/2016, Print       I personally performed the services  described in this documentation, which was scribed in my presence. The recorded information has been reviewed and is accurate.       Julianne Rice, MD 02/27/16 2106

## 2016-02-27 NOTE — ED Triage Notes (Signed)
Pt states she was seen recently at [facility] for kidney stone, states has been taking hydrocodone and flomax as directed. She says she woke up with a rash after taking the medication on her right abdomen and right back that feels like pins and needles (pt with fluid filled blisters to the right side).

## 2016-02-27 NOTE — ED Notes (Signed)
MD at bedside. 

## 2016-03-18 ENCOUNTER — Encounter (HOSPITAL_BASED_OUTPATIENT_CLINIC_OR_DEPARTMENT_OTHER): Payer: Self-pay | Admitting: Emergency Medicine

## 2016-03-18 ENCOUNTER — Emergency Department (HOSPITAL_BASED_OUTPATIENT_CLINIC_OR_DEPARTMENT_OTHER)
Admission: EM | Admit: 2016-03-18 | Discharge: 2016-03-18 | Disposition: A | Discharge status: Home / Self Care | Payer: BC Managed Care – PPO | Attending: Emergency Medicine | Admitting: Emergency Medicine

## 2016-03-18 DIAGNOSIS — B0229 Other postherpetic nervous system involvement: Secondary | ICD-10-CM | POA: Insufficient documentation

## 2016-03-18 DIAGNOSIS — I10 Essential (primary) hypertension: Secondary | ICD-10-CM | POA: Insufficient documentation

## 2016-03-18 DIAGNOSIS — F17210 Nicotine dependence, cigarettes, uncomplicated: Secondary | ICD-10-CM | POA: Insufficient documentation

## 2016-03-18 DIAGNOSIS — F1290 Cannabis use, unspecified, uncomplicated: Secondary | ICD-10-CM | POA: Insufficient documentation

## 2016-03-18 MED ORDER — GABAPENTIN 300 MG PO CAPS: ORAL_CAPSULE | ORAL | 0 refills | Status: DC

## 2016-03-18 NOTE — Discharge Instructions (Signed)
You may take '1000mg'$  tylenol up to three times daily along with gabapentin to help with pain.

## 2016-03-18 NOTE — ED Triage Notes (Signed)
Pt dx with shingles and has been taking medication for same. Pt states pain persists.

## 2016-03-18 NOTE — ED Provider Notes (Signed)
[place] DEPT [facility] Provider Note   CSN: [id number] Arrival date & time: 03/18/16  1140     History   Chief Complaint Chief Complaint  Patient presents with  . Herpes Zoster    HPI Donna Curry is a 60 y.o. female.  60 year old female who presents with shingles pain. The patient was evaluated on 8/5 for pain and rash on her right flank. She was diagnosed with shingles and started on antiviral medications as well as gabapentin and narcotics. She has been taking these medications and ran out of gabapentin a few days ago. She reports that her pain is severe and constant on her right side. She denies any fevers, vomiting, abdominal pain, urinary symptoms, or new symptoms since her shingles diagnosis. Pain is worse with palpation.   The history is provided by the patient.    Past Medical History:  Diagnosis Date  . Hypertension   . Measles as a child  . Mumps as a child  . Tobacco abuse     Patient Active Problem List   Diagnosis Date Noted  . Dizziness and giddiness 04/12/2013  . Palpitations 03/27/2013  . Loss of weight 06/08/2012  . Degenerative disc disease, lumbar 01/30/2012  . Adrenal mass ([place]) 01/30/2012  . Hypercalcemia 01/30/2012  . Hyperproteinemia 01/30/2012  . Borderline diabetes 01/30/2012  . Normocytic anemia 03/11/2011  . TOBACCO ABUSE 08/31/2009  . Essential hypertension 08/31/2009    Past Surgical History:  Procedure Laterality Date  . tubes tied    . uterine ablation     about 2005    OB History    No data available       Home Medications    Prior to Admission medications   Medication Sig Start Date End Date Taking? Authorizing Provider  acyclovir (ZOVIRAX) 400 MG tablet Take 1 tablet (400 mg total) by mouth 4 (four) times daily. 02/27/16   Julianne Rice, MD  gabapentin (NEURONTIN) 300 MG capsule Take 1 tablet ('300mg'$ ) by mouth once daily on day 1, then 1 tablet ('300mg'$ ) twice daily on day 2, then increase to 1 tablet ('300mg'$ ) three  times daily on day 3 and continue taking 1 tablet 3 times daily 03/18/16   Sharlett Iles, MD  HYDROcodone-acetaminophen (NORCO) 5-325 MG tablet Take 1 tablet by mouth every 4 (four) hours as needed for severe pain. 02/27/16   Julianne Rice, MD  lisinopril-hydrochlorothiazide (PRINZIDE,ZESTORETIC) 20-12.5 MG tablet Take 1 tablet by mouth daily. 08/03/15   [name], NP  meloxicam (MOBIC) 7.5 MG tablet Take 1 tablet (7.5 mg total) by mouth daily. 08/03/15   [name], NP    Family History Family History  Problem Relation Age of Onset  . Hypertension Mother   . Diabetes Father   . Hypertension Brother   . HIV Brother     Social History Social History  Substance Use Topics  . Smoking status: Current Some Day Smoker    Packs/day: 0.50    Types: Cigarettes    Last attempt to quit: 03/22/2013  . Smokeless tobacco: Never Used     Comment: 1/4 pack per day,Hasnt smoked in 8 days  . Alcohol use No     Allergies   Review of patient's allergies indicates no known allergies.   Review of Systems Review of Systems 10 Systems reviewed and are negative for acute change except as noted in the HPI.   Physical Exam Updated Vital Signs BP (!) 149/107 (BP Location: Right Arm)   Pulse 86  Temp 97.7 F (36.5 C) (Oral)   Resp 16   Ht '5\' 5"'$  (1.651 m)   Wt 98 lb (44.5 kg)   SpO2 100%   BMI 16.31 kg/m   Physical Exam  Constitutional: She is oriented to person, place, and time. No distress.  Thin, frail-appearing woman, uncomfortable  HENT:  Head: Normocephalic and atraumatic.  Moist mucous membranes  Eyes: Conjunctivae are normal.  Neck: Neck supple.  Cardiovascular: Normal rate, regular rhythm and normal heart sounds.   No murmur heard. Pulmonary/Chest: Effort normal and breath sounds normal.  Abdominal: Soft. Bowel sounds are normal. She exhibits no distension. There is no tenderness.  Musculoskeletal: She exhibits no edema.  Neurological: She is alert and  oriented to person, place, and time.  Fluent speech  Skin: Skin is warm and dry. Rash noted.  Tender, Scabbed ulcerations scattered on R side across mid abdomen to flank and lower thoracic back;no erythema or drainage  Psychiatric: She has a normal mood and affect. Judgment normal.  Nursing note and vitals reviewed.    ED Treatments / Results  Labs (all labs ordered are listed, but only abnormal results are displayed) Labs Reviewed - No data to display  EKG  EKG Interpretation None       Radiology No results found.  Procedures Procedures (including critical care time)  Medications Ordered in ED Medications - No data to display   Initial Impression / Assessment and Plan / ED Course  I have reviewed the triage vital signs and the nursing notes.    Clinical Course   Pt w/ ongoing pain at shingles site on R side, No fever or other infectious symptoms. Vital signs reassuring. No evidence of bacterial infection on exam, most of the lesions are scabbed over. Her description suggests postherpetic neuralgia and I have provided with a refill of gabapentin. Also instructed on using Tylenol on a schedule. Instructed to follow-up with her PCP regarding further treatment. Return precautions reviewed. Patient voiced understanding and was discharged in satisfactory condition.  Final Clinical Impressions(s) / ED Diagnoses   Final diagnoses:  Post herpetic neuralgia    New Prescriptions Discharge Medication List as of 03/18/2016  1:26 PM       Sharlett Iles, MD 03/18/16 1705

## 2016-04-01 ENCOUNTER — Emergency Department (HOSPITAL_BASED_OUTPATIENT_CLINIC_OR_DEPARTMENT_OTHER): Payer: Self-pay

## 2016-04-01 ENCOUNTER — Inpatient Hospital Stay (HOSPITAL_BASED_OUTPATIENT_CLINIC_OR_DEPARTMENT_OTHER)
Admission: EM | Admit: 2016-04-01 | Discharge: 2016-04-05 | DRG: 304 | Disposition: A | Discharge status: Home / Self Care | Payer: Self-pay | Attending: Cardiology | Admitting: Cardiology

## 2016-04-01 ENCOUNTER — Encounter (HOSPITAL_BASED_OUTPATIENT_CLINIC_OR_DEPARTMENT_OTHER): Payer: Self-pay | Admitting: Emergency Medicine

## 2016-04-01 DIAGNOSIS — F17200 Nicotine dependence, unspecified, uncomplicated: Secondary | ICD-10-CM | POA: Diagnosis present

## 2016-04-01 DIAGNOSIS — Z681 Body mass index (BMI) 19 or less, adult: Secondary | ICD-10-CM

## 2016-04-01 DIAGNOSIS — G8929 Other chronic pain: Secondary | ICD-10-CM | POA: Diagnosis present

## 2016-04-01 DIAGNOSIS — E46 Unspecified protein-calorie malnutrition: Secondary | ICD-10-CM | POA: Diagnosis present

## 2016-04-01 DIAGNOSIS — I161 Hypertensive emergency: Principal | ICD-10-CM | POA: Diagnosis present

## 2016-04-01 DIAGNOSIS — I071 Rheumatic tricuspid insufficiency: Secondary | ICD-10-CM | POA: Diagnosis present

## 2016-04-01 DIAGNOSIS — E876 Hypokalemia: Secondary | ICD-10-CM | POA: Diagnosis present

## 2016-04-01 DIAGNOSIS — R112 Nausea with vomiting, unspecified: Secondary | ICD-10-CM | POA: Diagnosis present

## 2016-04-01 DIAGNOSIS — Z8249 Family history of ischemic heart disease and other diseases of the circulatory system: Secondary | ICD-10-CM

## 2016-04-01 DIAGNOSIS — I214 Non-ST elevation (NSTEMI) myocardial infarction: Secondary | ICD-10-CM

## 2016-04-01 DIAGNOSIS — E43 Unspecified severe protein-calorie malnutrition: Secondary | ICD-10-CM | POA: Diagnosis present

## 2016-04-01 DIAGNOSIS — F17210 Nicotine dependence, cigarettes, uncomplicated: Secondary | ICD-10-CM | POA: Diagnosis present

## 2016-04-01 DIAGNOSIS — E278 Other specified disorders of adrenal gland: Secondary | ICD-10-CM | POA: Diagnosis present

## 2016-04-01 DIAGNOSIS — I10 Essential (primary) hypertension: Secondary | ICD-10-CM | POA: Diagnosis present

## 2016-04-01 DIAGNOSIS — T502X5A Adverse effect of carbonic-anhydrase inhibitors, benzothiadiazides and other diuretics, initial encounter: Secondary | ICD-10-CM | POA: Diagnosis present

## 2016-04-01 DIAGNOSIS — Z79899 Other long term (current) drug therapy: Secondary | ICD-10-CM

## 2016-04-01 DIAGNOSIS — R64 Cachexia: Secondary | ICD-10-CM | POA: Diagnosis present

## 2016-04-01 DIAGNOSIS — Z8619 Personal history of other infectious and parasitic diseases: Secondary | ICD-10-CM

## 2016-04-01 DIAGNOSIS — Z91128 Patient's intentional underdosing of medication regimen for other reason: Secondary | ICD-10-CM

## 2016-04-01 LAB — URINALYSIS, ROUTINE W REFLEX MICROSCOPIC
Bilirubin Urine: NEGATIVE
Glucose, UA: 100 mg/dL — AB
Ketones, ur: NEGATIVE mg/dL
Leukocytes, UA: NEGATIVE
Nitrite: NEGATIVE
Protein, ur: >300 mg/dL — AB
Specific Gravity, Urine: 1.015 (ref 1.005–1.030)
pH: 7.5 (ref 5.0–8.0)

## 2016-04-01 LAB — CBC
HCT: 41.9 % (ref 36.0–46.0)
Hemoglobin: 14.5 g/dL (ref 12.0–15.0)
MCH: 31.7 pg (ref 26.0–34.0)
MCHC: 34.6 g/dL (ref 30.0–36.0)
MCV: 91.7 fL (ref 78.0–100.0)
Platelets: 236 10*3/uL (ref 150–400)
RBC: 4.57 MIL/uL (ref 3.87–5.11)
RDW: 14.9 % (ref 11.5–15.5)
WBC: 8.9 10*3/uL (ref 4.0–10.5)

## 2016-04-01 LAB — COMPREHENSIVE METABOLIC PANEL
ALT: 22 U/L (ref 14–54)
AST: 37 U/L (ref 15–41)
Albumin: 4.3 g/dL (ref 3.5–5.0)
Alkaline Phosphatase: 87 U/L (ref 38–126)
Anion gap: 13 (ref 5–15)
BUN: 15 mg/dL (ref 6–20)
CO2: 27 mmol/L (ref 22–32)
Calcium: 9.6 mg/dL (ref 8.9–10.3)
Chloride: 99 mmol/L — ABNORMAL LOW (ref 101–111)
Creatinine, Ser: 0.9 mg/dL (ref 0.44–1.00)
GFR calc Af Amer: >60 mL/min (ref >60)
GFR calc non Af Amer: >60 mL/min (ref >60)
Glucose, Bld: 128 mg/dL — ABNORMAL HIGH (ref 65–99)
Potassium: 3.1 mmol/L — ABNORMAL LOW (ref 3.5–5.1)
Sodium: 139 mmol/L (ref 135–145)
Total Bilirubin: 0.7 mg/dL (ref 0.3–1.2)
Total Protein: 8.6 g/dL — ABNORMAL HIGH (ref 6.5–8.1)

## 2016-04-01 LAB — URINE MICROSCOPIC-ADD ON

## 2016-04-01 LAB — LIPASE, BLOOD: Lipase: 20 U/L (ref 11–51)

## 2016-04-01 LAB — TROPONIN I
Troponin I: 0.11 ng/mL (ref <0.03)
Troponin I: 0.16 ng/mL (ref <0.03)

## 2016-04-01 MED ORDER — LABETALOL HCL 5 MG/ML IV SOLN
5.0000 mg | Freq: Once | INTRAVENOUS | Status: AC
  Administered 2016-04-01: 5 mg via INTRAVENOUS
  Filled 2016-04-01: qty 4

## 2016-04-01 MED ORDER — ONDANSETRON HCL 4 MG/2ML IJ SOLN
4.0000 mg | Freq: Once | INTRAMUSCULAR | Status: AC | PRN
  Administered 2016-04-01: 4 mg via INTRAVENOUS
  Filled 2016-04-01: qty 2

## 2016-04-01 MED ORDER — LABETALOL HCL 5 MG/ML IV SOLN: 5.0000 mg | Freq: Once | INTRAVENOUS | Status: DC

## 2016-04-01 MED ORDER — IOPAMIDOL (ISOVUE-370) INJECTION 76%
100.0000 mL | Freq: Once | INTRAVENOUS | Status: AC | PRN
  Administered 2016-04-01: 100 mL via INTRAVENOUS

## 2016-04-01 MED ORDER — METOCLOPRAMIDE HCL 5 MG/ML IJ SOLN
10.0000 mg | Freq: Once | INTRAMUSCULAR | Status: AC
  Administered 2016-04-01: 10 mg via INTRAVENOUS
  Filled 2016-04-01: qty 2

## 2016-04-01 MED ORDER — LABETALOL HCL 5 MG/ML IV SOLN
10.0000 mg | Freq: Once | INTRAVENOUS | Status: AC
  Administered 2016-04-01: 10 mg via INTRAVENOUS
  Filled 2016-04-01: qty 4

## 2016-04-01 MED ORDER — NITROGLYCERIN IN D5W 200-5 MCG/ML-% IV SOLN
10.0000 ug/min | INTRAVENOUS | Status: DC
  Administered 2016-04-01: 10 ug/min via INTRAVENOUS
  Filled 2016-04-01: qty 250

## 2016-04-01 NOTE — ED Triage Notes (Signed)
Pt states she has been throwing up since last night

## 2016-04-01 NOTE — Plan of Care (Signed)
60 yo W hx of HTN some nausea and vomiting, hx of HTN, and shingles BP up to 200 Elevated  Troponin 0.11 >1.16, no chest pain but some diffuse changes on ECG St depressions no change after repeated ECG   Now on nitro drip bp down to 180 nausea improved CTA chest no dissection. CT abdomen unremarkable  Requested cardiology consult Accepted to Step down   99.7 Hr 83 Bp 182/121 rr 14 sat 99 RA  Deann Mclaine 11:41 PM

## 2016-04-01 NOTE — ED Notes (Signed)
Patient transported to CT. Pt remains on telemetry for scan.

## 2016-04-01 NOTE — ED Notes (Signed)
Patient transported to CT 

## 2016-04-01 NOTE — ED Notes (Signed)
Tamera Punt, MD aware of pt's V/S

## 2016-04-01 NOTE — ED Provider Notes (Signed)
[facility] Provider Note   CSN: [id number] Arrival date & time: 04/01/16  1624     History   Chief Complaint Chief Complaint  Patient presents with  . Emesis    HPI Donna Curry is a 60 y.o. female.  Patient is a 60 year old female with a history of hypertension, tobacco abuse, adrenal mass and borderline diabetes presents with nausea and vomiting. She states she started vomiting last night. She's having a little bit of loose stools. She denies any headache. No vision changes. She states her blood pressures been elevated because she hasn't been able to keep down her medications. She states that she does have some vomiting occasionally when her blood pressure gets elevated. She denies any abdominal pain although she's currently being treated for shingles and she still has pain in the area of the rash to her left flank. She denies any recent illnesses. No chest pain or shortness of breath. No numbness or weakness to her extremities. No vision changes. No headache.    Emesis   Pertinent negatives include no abdominal pain, no arthralgias, no chills, no cough, no diarrhea, no fever and no headaches.    Past Medical History:  Diagnosis Date  . Hypertension   . Measles as a child  . Mumps as a child  . Tobacco abuse     Patient Active Problem List   Diagnosis Date Noted  . Hypertensive emergency 04/01/2016  . Dizziness and giddiness 04/12/2013  . Palpitations 03/27/2013  . Loss of weight 06/08/2012  . Degenerative disc disease, lumbar 01/30/2012  . Adrenal mass ([place]) 01/30/2012  . Hypercalcemia 01/30/2012  . Hyperproteinemia 01/30/2012  . Borderline diabetes 01/30/2012  . Normocytic anemia 03/11/2011  . TOBACCO ABUSE 08/31/2009  . Essential hypertension 08/31/2009    Past Surgical History:  Procedure Laterality Date  . tubes tied    . uterine ablation     about 2005    OB History    No data available       Home Medications    Prior to  Admission medications   Medication Sig Start Date End Date Taking? Authorizing Provider  acyclovir (ZOVIRAX) 400 MG tablet Take 1 tablet (400 mg total) by mouth 4 (four) times daily. 02/27/16   Julianne Rice, MD  gabapentin (NEURONTIN) 300 MG capsule Take 1 tablet ('300mg'$ ) by mouth once daily on day 1, then 1 tablet ('300mg'$ ) twice daily on day 2, then increase to 1 tablet ('300mg'$ ) three times daily on day 3 and continue taking 1 tablet 3 times daily 03/18/16   Sharlett Iles, MD  HYDROcodone-acetaminophen (NORCO) 5-325 MG tablet Take 1 tablet by mouth every 4 (four) hours as needed for severe pain. 02/27/16   Julianne Rice, MD  lisinopril-hydrochlorothiazide (PRINZIDE,ZESTORETIC) 20-12.5 MG tablet Take 1 tablet by mouth daily. 08/03/15   [name], NP  meloxicam (MOBIC) 7.5 MG tablet Take 1 tablet (7.5 mg total) by mouth daily. 08/03/15   [name], NP    Family History Family History  Problem Relation Age of Onset  . Hypertension Mother   . Diabetes Father   . Hypertension Brother   . HIV Brother     Social History Social History  Substance Use Topics  . Smoking status: Current Some Day Smoker    Packs/day: 0.50    Types: Cigarettes    Last attempt to quit: 03/22/2013  . Smokeless tobacco: Never Used     Comment: 1/4 pack per day,Hasnt smoked in 8 days  .  Alcohol use No     Allergies   Review of patient's allergies indicates no known allergies.   Review of Systems Review of Systems  Constitutional: Negative for chills, diaphoresis, fatigue and fever.  HENT: Negative for congestion, rhinorrhea and sneezing.   Eyes: Negative.   Respiratory: Negative for cough, chest tightness and shortness of breath.   Cardiovascular: Negative for chest pain and leg swelling.  Gastrointestinal: Positive for nausea and vomiting. Negative for abdominal pain, blood in stool and diarrhea.  Genitourinary: Negative for difficulty urinating, flank pain, frequency and hematuria.    Musculoskeletal: Negative for arthralgias and back pain.  Skin: Positive for rash (Shingles).  Neurological: Negative for dizziness, speech difficulty, weakness, numbness and headaches.     Physical Exam Updated Vital Signs BP (!) 184/120   Pulse 80   Temp 99.2 F (37.3 C) (Oral)   Resp 15   Ht '5\' 5"'$  (1.651 m)   Wt 98 lb (44.5 kg)   SpO2 100%   BMI 16.31 kg/m   Physical Exam  Constitutional: She is oriented to person, place, and time. She appears well-developed and well-nourished.  HENT:  Head: Normocephalic and atraumatic.  Eyes: Pupils are equal, round, and reactive to light.  Neck: Normal range of motion. Neck supple.  Cardiovascular: Normal rate, regular rhythm and normal heart sounds.   Pulmonary/Chest: Effort normal and breath sounds normal. No respiratory distress. She has no wheezes. She has no rales. She exhibits no tenderness.  Abdominal: Soft. Bowel sounds are normal. There is tenderness (Moderate diffuse tenderness). There is no rebound and no guarding.  Musculoskeletal: Normal range of motion. She exhibits no edema.  Lymphadenopathy:    She has no cervical adenopathy.  Neurological: She is alert and oriented to person, place, and time.  Skin: Skin is warm and dry. Rash (Scabbed over rash to her left back and flank) noted.  Psychiatric: She has a normal mood and affect.     ED Treatments / Results  Labs (all labs ordered are listed, but only abnormal results are displayed) Labs Reviewed  COMPREHENSIVE METABOLIC PANEL - Abnormal; Notable for the following:       Result Value   Potassium 3.1 (*)    Chloride 99 (*)    Glucose, Bld 128 (*)    Total Protein 8.6 (*)    All other components within normal limits  URINALYSIS, ROUTINE W REFLEX MICROSCOPIC (NOT AT [facility]) - Abnormal; Notable for the following:    Glucose, UA 100 (*)    Hgb urine dipstick LARGE (*)    Protein, ur >300 (*)    All other components within normal limits  URINE MICROSCOPIC-ADD ON -  Abnormal; Notable for the following:    Squamous Epithelial / LPF 0-5 (*)    Bacteria, UA RARE (*)    All other components within normal limits  TROPONIN I - Abnormal; Notable for the following:    Troponin I 0.11 (*)    All other components within normal limits  TROPONIN I - Abnormal; Notable for the following:    Troponin I 0.16 (*)    All other components within normal limits  LIPASE, BLOOD  CBC    EKG  EKG Interpretation  Date/Time:  Friday April 01 2016 23:52:41 EDT Ventricular Rate:  81 PR Interval:    QRS Duration: 77 QT Interval:  460 QTC Calculation: 534 R Axis:   85 Text Interpretation:  Sinus rhythm Short PR interval Biatrial enlargement Borderline right axis deviation Left ventricular hypertrophy  Prolonged QT interval Confirmed by Gregory Dowe  MD, [name] ([id number]) on 04/01/2016 11:55:39 PM       Radiology Dg Abd Acute W/chest  Result Date: 04/01/2016 CLINICAL DATA:  Abdominal pain, vomiting EXAM: DG ABDOMEN ACUTE W/ 1V CHEST COMPARISON:  07/24/2015 FINDINGS: Cardiomediastinal silhouette is stable. Mild lower thoracic dextroscoliosis. No acute infiltrate or pleural effusion. No pulmonary edema. There is levoscoliosis of lumbar spine. Normal small bowel gas pattern. No free abdominal air. Degenerative changes are noted lumbar spine. Post tubal ligation surgical clips are noted. IMPRESSION: No acute disease within chest. Normal small bowel gas pattern. Thoracolumbar scoliosis. No free abdominal air. Electronically Signed   By: Lahoma Crocker M.D.   On: 04/01/2016 18:10   Ct Angio Chest Aorta W And/or Wo Contrast  Addendum Date: 04/01/2016   ADDENDUM REPORT: 04/01/2016 23:41 ADDENDUM: Additional images performed following administration of additional 50 cc contrast for evaluation of the aorta. There is mild atherosclerotic calcification of the thoracic aorta. The ascending aorta measures approximately 3.5 cm in diameter. No aneurysmal dilatation or evidence of dissection. There is  ectasia of the distal descending aorta measuring 2.8 cm in diameter. The origins of the great vessels of the aortic arch appear patent. There is intimal thickening of the origin of the left common carotid artery with minimal narrowing of the lumen of the vessel. Electronically Signed   By: Anner Crete M.D.   On: 04/01/2016 23:41   Result Date: 04/01/2016 CLINICAL DATA:  60 year old female with vomiting, and hypertension. EXAM: CT ANGIOGRAPHY CHEST WITH CONTRAST TECHNIQUE: Multidetector CT imaging of the chest was performed using the standard protocol during bolus administration of intravenous contrast. Multiplanar CT image reconstructions and MIPs were obtained to evaluate the vascular anatomy. CONTRAST:  100 cc Isovue 370 COMPARISON:  Chest radiograph dated 04/01/2016 FINDINGS: There is emphysematous changes of the lungs. No focal consolidation, pleural effusion, or pneumothorax. The lungs are hyperexpanded with flattening of the diaphragms. The central airways are patent. There is moderate atherosclerotic calcification of the thoracic aorta. There is no aneurysmal dilatation. Evaluation of the aorta is limited due to suboptimal opacification and timing of the contrast. There is no CT evidence of pulmonary embolism. There is no cardiomegaly or pericardial effusion. Left ventricular hypertrophy. There is no hilar or mediastinal adenopathy. The esophagus is grossly unremarkable. No thyroid nodules identified. There is no axillary adenopathy. There is loss of subcutaneous soft tissue fat and cachexia. The osseous structures appear intact. Partially visualized low attenuating enlargement of the adrenal glands, likely underlying adenoma as seen on the CT of the abdomen pelvis. The visualized upper abdomen is otherwise unremarkable. Review of the MIP images confirms the above findings. IMPRESSION: No acute intrathoracic pathology. No CT evidence of pulmonary embolism. Emphysema. Cachexia. Electronically Signed: By:  Anner Crete M.D. On: 04/01/2016 22:54   Ct Renal Stone Study  Result Date: 04/01/2016 CLINICAL DATA:  Right flank pain.  Nausea and hematuria EXAM: CT ABDOMEN AND PELVIS WITHOUT CONTRAST TECHNIQUE: Multidetector CT imaging of the abdomen and pelvis was performed following the standard protocol without IV contrast. COMPARISON:  02/24/2016 FINDINGS: Lower chest: No acute abnormality. Hepatobiliary: No focal liver abnormality is seen. No gallstones, gallbladder wall thickening, or biliary dilatation. Pancreas: Unremarkable. No pancreatic ductal dilatation or surrounding inflammatory changes. Spleen: Normal in size without focal abnormality. Adrenals/Urinary Tract: Low-attenuation enlargement of the adrenal glands again noted likely reflecting underlying adenomas. No kidney stones or hydronephrosis identified. The urinary bladder appears collapsed. No bladder calculi noted. Stomach/Bowel: Moderate distension  of the gastric lumen. No pathologic dilatation of the large or small bowel loops. Vascular/Lymphatic: Aortic atherosclerosis is identified. No aneurysm. No evidence for abdominal or pelvic adenopathy. Reproductive: Uterus and bilateral adnexa are unremarkable. Other: No abdominal wall hernia or abnormality. No abdominopelvic ascites. Musculoskeletal: There is a scoliosis deformity involving the lumbar spine which is convex towards the left. Multi level degenerative disc disease identified within the lumbar spine. No aggressive lytic or sclerotic bone lesions. IMPRESSION: 1. No kidney stones or evidence of hydronephrosis. 2. Aortic atherosclerosis. Electronically Signed   By: Kerby Moors M.D.   On: 04/01/2016 19:23    Procedures Procedures (including critical care time)  Medications Ordered in ED Medications  nitroGLYCERIN 50 mg in dextrose 5 % 250 mL (0.2 mg/mL) infusion (5 mcg/min Intravenous Rate/Dose Change 04/01/16 2202)  ondansetron (ZOFRAN) injection 4 mg (4 mg Intravenous Given 04/01/16 1727)    labetalol (NORMODYNE,TRANDATE) injection 5 mg (5 mg Intravenous Given 04/01/16 1754)  metoCLOPramide (REGLAN) injection 10 mg (10 mg Intravenous Given 04/01/16 1924)  labetalol (NORMODYNE,TRANDATE) injection 10 mg (10 mg Intravenous Given 04/01/16 1924)  iopamidol (ISOVUE-370) 76 % injection 100 mL (100 mLs Intravenous Contrast Given 04/01/16 2213)     Initial Impression / Assessment and Plan / ED Course  I have reviewed the triage vital signs and the nursing notes.  Pertinent labs & imaging results that were available during my care of the patient were reviewed by me and considered in my medical decision making (see chart for details).  Clinical Course    Patient presents with nausea and vomiting. She was markedly hypertensive on arrival. She has no headache or neurologic deficits. She did have some abdominal pain on exam. She has no evidence of intra-abdominal pathology. Her EKG showed some ST depression and hip or acute T waves which is changed from her prior EKG. There is no new ST elevation. She denies any chest pain or shortness of breath. No dizziness. Her troponin is mildly positive. I feel her symptoms are related to a hypertensive emergency. She was initially given labetalol with no significant improvement in symptoms. She was then started on nitroglycerin drip which is improving her blood pressure. Her vomiting has completely resolved. She's feeling much better. She's had a repeat troponin which is still mildly elevated. Given her EKG abnormalities in symptoms, I did do a CT of her chest which did not show any evidence of aortic dissection. I will consult the hospitalist for admission and further treatment.  I spoke with Dr. Roel Cluck who has accepted the pt for transfer to [facility] for admission to stepdown.  DIscussed pt with Dr. Eula Fried, cards fellow.  HE DOES NOT RECOMMEND HEPARIN.  He does recommend ECHO in the am and ASA and statins  CRITICAL CARE Performed by: Alysah Carton Total  critical care time: 75 minutes Critical care time was exclusive of separately billable procedures and treating other patients. Critical care was necessary to treat or prevent imminent or life-threatening deterioration. Critical care was time spent personally by me on the following activities: development of treatment plan with patient and/or surrogate as well as nursing, discussions with consultants, evaluation of patient's response to treatment, examination of patient, obtaining history from patient or surrogate, ordering and performing treatments and interventions, ordering and review of laboratory studies, ordering and review of radiographic studies, pulse oximetry and re-evaluation of patient's condition.   Final Clinical Impressions(s) / ED Diagnoses   Final diagnoses:  Hypertensive emergency  NSTEMI (non-ST elevated myocardial infarction) ([place])  New Prescriptions New Prescriptions   No medications on file     Malvin Johns, MD 04/02/16 [phone]

## 2016-04-01 NOTE — ED Notes (Signed)
Per Dr. Tamera Punt, titrate nitro drip to SBP no less than 180.

## 2016-04-02 DIAGNOSIS — I161 Hypertensive emergency: Principal | ICD-10-CM

## 2016-04-02 DIAGNOSIS — E46 Unspecified protein-calorie malnutrition: Secondary | ICD-10-CM | POA: Diagnosis present

## 2016-04-02 DIAGNOSIS — E279 Disorder of adrenal gland, unspecified: Secondary | ICD-10-CM

## 2016-04-02 DIAGNOSIS — E876 Hypokalemia: Secondary | ICD-10-CM | POA: Diagnosis not present

## 2016-04-02 DIAGNOSIS — I10 Essential (primary) hypertension: Secondary | ICD-10-CM

## 2016-04-02 LAB — TROPONIN I
Troponin I: 0.14 ng/mL (ref <0.03)
Troponin I: 0.11 ng/mL (ref <0.03)
Troponin I: 0.07 ng/mL (ref <0.03)
Troponin I: 0.06 ng/mL (ref <0.03)

## 2016-04-02 LAB — CBC
HCT: 44.3 % (ref 36.0–46.0)
Hemoglobin: 15 g/dL (ref 12.0–15.0)
MCH: 31.4 pg (ref 26.0–34.0)
MCHC: 33.9 g/dL (ref 30.0–36.0)
MCV: 92.7 fL (ref 78.0–100.0)
Platelets: 227 10*3/uL (ref 150–400)
RBC: 4.78 MIL/uL (ref 3.87–5.11)
RDW: 15.5 % (ref 11.5–15.5)
WBC: 6.8 10*3/uL (ref 4.0–10.5)

## 2016-04-02 LAB — PROTIME-INR
INR: 1.01
Prothrombin Time: 13.3 seconds (ref 11.4–15.2)

## 2016-04-02 LAB — COMPREHENSIVE METABOLIC PANEL
ALT: 22 U/L (ref 14–54)
AST: 31 U/L (ref 15–41)
Albumin: 3.8 g/dL (ref 3.5–5.0)
Alkaline Phosphatase: 88 U/L (ref 38–126)
Anion gap: 15 (ref 5–15)
BUN: 19 mg/dL (ref 6–20)
CO2: 25 mmol/L (ref 22–32)
Calcium: 9.8 mg/dL (ref 8.9–10.3)
Chloride: 98 mmol/L — ABNORMAL LOW (ref 101–111)
Creatinine, Ser: 1.29 mg/dL — ABNORMAL HIGH (ref 0.44–1.00)
GFR calc Af Amer: 51 mL/min — ABNORMAL LOW (ref >60)
GFR calc non Af Amer: 44 mL/min — ABNORMAL LOW (ref >60)
Glucose, Bld: 126 mg/dL — ABNORMAL HIGH (ref 65–99)
Potassium: 3.5 mmol/L (ref 3.5–5.1)
Sodium: 138 mmol/L (ref 135–145)
Total Bilirubin: 0.7 mg/dL (ref 0.3–1.2)
Total Protein: 7.7 g/dL (ref 6.5–8.1)

## 2016-04-02 LAB — CORTISOL-AM, BLOOD: Cortisol - AM: 29.8 ug/dL — ABNORMAL HIGH (ref 6.7–22.6)

## 2016-04-02 LAB — LIPID PANEL
Cholesterol: 218 mg/dL — ABNORMAL HIGH (ref 0–200)
HDL: 85 mg/dL (ref >40)
LDL Cholesterol: 115 mg/dL — ABNORMAL HIGH (ref 0–99)
Total CHOL/HDL Ratio: 2.6 RATIO
Triglycerides: 91 mg/dL (ref <150)
VLDL: 18 mg/dL (ref 0–40)

## 2016-04-02 LAB — BRAIN NATRIURETIC PEPTIDE: B Natriuretic Peptide: 569.2 pg/mL — ABNORMAL HIGH (ref 0.0–100.0)

## 2016-04-02 LAB — TSH: TSH: 1.668 u[IU]/mL (ref 0.350–4.500)

## 2016-04-02 LAB — GLUCOSE, CAPILLARY: Glucose-Capillary: 151 mg/dL — ABNORMAL HIGH (ref 65–99)

## 2016-04-02 MED ORDER — BOOST / RESOURCE BREEZE PO LIQD
1.0000 | Freq: Three times a day (TID) | ORAL | Status: DC
  Administered 2016-04-02 – 2016-04-04 (×8): 1 via ORAL

## 2016-04-02 MED ORDER — ASPIRIN EC 81 MG PO TBEC
81.0000 mg | DELAYED_RELEASE_TABLET | Freq: Every day | ORAL | Status: DC
  Administered 2016-04-02 – 2016-04-05 (×4): 81 mg via ORAL
  Filled 2016-04-02 (×4): qty 1

## 2016-04-02 MED ORDER — HYDROCODONE-ACETAMINOPHEN 5-325 MG PO TABS
1.0000 | ORAL_TABLET | ORAL | Status: DC | PRN
  Administered 2016-04-02 – 2016-04-05 (×7): 1 via ORAL
  Filled 2016-04-02 (×7): qty 1

## 2016-04-02 MED ORDER — LABETALOL HCL 5 MG/ML IV SOLN
0.5000 mg/min | INTRAVENOUS | Status: DC
  Administered 2016-04-02: 0.5 mg/min via INTRAVENOUS
  Filled 2016-04-02: qty 100

## 2016-04-02 MED ORDER — ADULT MULTIVITAMIN W/MINERALS CH
1.0000 | ORAL_TABLET | Freq: Every day | ORAL | Status: DC
  Administered 2016-04-02 – 2016-04-05 (×4): 1 via ORAL
  Filled 2016-04-02 (×4): qty 1

## 2016-04-02 MED ORDER — LISINOPRIL 20 MG PO TABS
20.0000 mg | ORAL_TABLET | Freq: Every day | ORAL | Status: DC
  Administered 2016-04-02: 20 mg via ORAL
  Filled 2016-04-02: qty 1

## 2016-04-02 MED ORDER — ATORVASTATIN CALCIUM 80 MG PO TABS
80.0000 mg | ORAL_TABLET | Freq: Every day | ORAL | Status: DC
  Administered 2016-04-02 – 2016-04-04 (×3): 80 mg via ORAL
  Filled 2016-04-02 (×3): qty 1

## 2016-04-02 MED ORDER — LISINOPRIL-HYDROCHLOROTHIAZIDE 20-12.5 MG PO TABS: 1.0000 | ORAL_TABLET | Freq: Every day | ORAL | Status: DC

## 2016-04-02 MED ORDER — MELOXICAM 7.5 MG PO TABS
7.5000 mg | ORAL_TABLET | Freq: Every day | ORAL | Status: DC
  Administered 2016-04-02 – 2016-04-05 (×4): 7.5 mg via ORAL
  Filled 2016-04-02 (×5): qty 1

## 2016-04-02 MED ORDER — HEPARIN SODIUM (PORCINE) 5000 UNIT/ML IJ SOLN
5000.0000 [IU] | Freq: Three times a day (TID) | INTRAMUSCULAR | Status: DC
  Administered 2016-04-02 – 2016-04-04 (×8): 5000 [IU] via SUBCUTANEOUS
  Filled 2016-04-02 (×9): qty 1

## 2016-04-02 MED ORDER — HYDROCHLOROTHIAZIDE 12.5 MG PO CAPS
12.5000 mg | ORAL_CAPSULE | Freq: Every day | ORAL | Status: DC
  Administered 2016-04-02: 12.5 mg via ORAL
  Filled 2016-04-02: qty 1

## 2016-04-02 MED ORDER — ACYCLOVIR 400 MG PO TABS
400.0000 mg | ORAL_TABLET | Freq: Four times a day (QID) | ORAL | Status: DC
  Administered 2016-04-02 – 2016-04-05 (×12): 400 mg via ORAL
  Filled 2016-04-02 (×15): qty 1

## 2016-04-02 MED ORDER — GABAPENTIN 300 MG PO CAPS
300.0000 mg | ORAL_CAPSULE | Freq: Two times a day (BID) | ORAL | Status: DC
  Administered 2016-04-02 – 2016-04-03 (×3): 300 mg via ORAL
  Filled 2016-04-02 (×3): qty 1

## 2016-04-02 MED ORDER — SODIUM CHLORIDE 0.9% FLUSH
3.0000 mL | Freq: Two times a day (BID) | INTRAVENOUS | Status: DC
  Administered 2016-04-02 – 2016-04-04 (×6): 3 mL via INTRAVENOUS

## 2016-04-02 NOTE — ED Provider Notes (Signed)
Due to persistent hypertension, I had to change her medications Labetalol drip started Due to this she will require ICU D/w dr Eula Fried with cardiology Will accept to [facility] under service of dr Lillia Carmel, MD 04/02/16 [phone]

## 2016-04-02 NOTE — H&P (Signed)
[facility]  CARDIOLOGY INPATIENT HISTORY AND PHYSICAL EXAMINATION NOTE  Patient ID: LAVERGNE HILTUNEN MRN: [id number], DOB/AGE: [birth year]   Admit date: 04/01/2016   Primary Physician: Nance Pear., NP Primary Cardiologist: new  Reason for admission: uncontrolled hypertension requiring labetalol IV drip and NTG gtt  HPI: This is a 60 y.o. AAF w/ history of adrenal incidentaloma, HTN (dx 2 yrs ago), tobacco abuse presented with intractable nausea and vomiting. She was not able to take her BP meds and developed systolic BP >097. These symptoms started on Friday night. She had associated abdominal cramps. She was unable to tolerate PO. She went to [facility] where her systolics were high so she was started on NTG gtt and labetalol gtt requiring ICU care. She denied chest pain, dyspnea, PND, orthopnea, leg swelling, increased abdominal girth, syncope, presyncope, palpitations.. She also denied any fevers, chills, PE/DVT, TIA, CVA, amaurosis fugax otherwise.   Problem List: Past Medical History:  Diagnosis Date  . Hypertension   . Measles as a child  . Mumps as a child  . Tobacco abuse     Past Surgical History:  Procedure Laterality Date  . tubes tied    . uterine ablation     about 2005     Allergies: No Known Allergies   Home Medications Current Facility-Administered Medications  Medication Dose Route Frequency Provider Last Rate Last Dose  . aspirin EC tablet 81 mg  81 mg Oral Daily Flossie Dibble, MD      . atorvastatin (LIPITOR) tablet 80 mg  80 mg Oral q1800 Flossie Dibble, MD      . heparin injection 5,000 Units  5,000 Units Subcutaneous Q8H Flossie Dibble, MD      . labetalol (NORMODYNE,TRANDATE) 500 mg in dextrose 5 % 125 mL (4 mg/mL) infusion  0.5-3 mg/min Intravenous Titrated Ripley Fraise, MD 7.5 mL/hr at 04/02/16 0330 0.5 mg/min at 04/02/16 0330  . nitroGLYCERIN 50 mg in dextrose 5 % 250 mL (0.2 mg/mL) infusion  10 mcg/min Intravenous Titrated Malvin Johns, MD   Stopped at 04/02/16 0206  . sodium chloride flush (NS) 0.9 % injection 3 mL  3 mL Intravenous Q12H Flossie Dibble, MD         Family History  Problem Relation Age of Onset  . Hypertension Mother   . Diabetes Father   . Hypertension Brother   . HIV Brother      Social History   Social History  . Marital status: Single    Spouse name: N/A  . Number of children: N/A  . Years of education: N/A   Occupational History  . Not on file.   Social History Main Topics  . Smoking status: Current Some Day Smoker    Packs/day: 0.50    Types: Cigarettes    Last attempt to quit: 03/22/2013  . Smokeless tobacco: Never Used     Comment: 1/4 pack per day,Hasnt smoked in 8 days  . Alcohol use No  . Drug use:      Comment: marijuana   . Sexual activity: No   Other Topics Concern  . Not on file   Social History Narrative   Denies hx of drug use   Single   1 daughter age 24 lives with daughter and grandson who is 71.   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     Review of Systems: General: negative for chills, fever, night sweats or weight  changes.  Cardiovascular: negative for dyspnea on exertion, edema, orthopnea, palpitations, paroxysmal nocturnal dyspnea or shortness of breath  Dermatological: negative for rash Respiratory: negative for cough or wheezing Urologic: negative for hematuria Abdominal: nausea, vomiting, abdominal cramps Neurologic: negative for visual changes, syncope, or dizziness Endocrine: no diabetes, no hypothyroidism Immunological: no lymph adenopathy Psych: non homicidal/suicidal  Physical Exam: Vitals: BP (!) 162/111   Pulse 80   Temp 98.3 F (36.8 C)   Resp 12   Ht '5\' 5"'$  (1.651 m)   Wt 31.6 kg (69 lb 10.7 oz)   SpO2 100%   BMI 11.59 kg/m  General: not in acute distress, cachectic looking female  Neck: JVP flat, neck supple Heart: regular rate and rhythm, S1, S2, no murmurs  Lungs: CTAB  GI: non  tender, non distended, bowel sounds present Extremities: no edema Neuro: AAO x 3  Psych: normal affect, no anxiety   Labs:   Results for orders placed or performed during the hospital encounter of 04/01/16 (from the past 24 hour(s))  Urinalysis, Routine w reflex microscopic     Status: Abnormal   Collection Time: 04/01/16  5:20 PM  Result Value Ref Range   Color, Urine YELLOW YELLOW   APPearance CLEAR CLEAR   Specific Gravity, Urine 1.015 1.005 - 1.030   pH 7.5 5.0 - 8.0   Glucose, UA 100 (A) NEGATIVE mg/dL   Hgb urine dipstick LARGE (A) NEGATIVE   Bilirubin Urine NEGATIVE NEGATIVE   Ketones, ur NEGATIVE NEGATIVE mg/dL   Protein, ur >300 (A) NEGATIVE mg/dL   Nitrite NEGATIVE NEGATIVE   Leukocytes, UA NEGATIVE NEGATIVE  Urine microscopic-add on     Status: Abnormal   Collection Time: 04/01/16  5:20 PM  Result Value Ref Range   Squamous Epithelial / LPF 0-5 (A) NONE SEEN   WBC, UA 0-5 0 - 5 WBC/hpf   RBC / HPF TOO NUMEROUS TO COUNT 0 - 5 RBC/hpf   Bacteria, UA RARE (A) NONE SEEN  Lipase, blood     Status: None   Collection Time: 04/01/16  5:30 PM  Result Value Ref Range   Lipase 20 11 - 51 U/L  Comprehensive metabolic panel     Status: Abnormal   Collection Time: 04/01/16  5:30 PM  Result Value Ref Range   Sodium 139 135 - 145 mmol/L   Potassium 3.1 (L) 3.5 - 5.1 mmol/L   Chloride 99 (L) 101 - 111 mmol/L   CO2 27 22 - 32 mmol/L   Glucose, Bld 128 (H) 65 - 99 mg/dL   BUN 15 6 - 20 mg/dL   Creatinine, Ser 0.90 0.44 - 1.00 mg/dL   Calcium 9.6 8.9 - 10.3 mg/dL   Total Protein 8.6 (H) 6.5 - 8.1 g/dL   Albumin 4.3 3.5 - 5.0 g/dL   AST 37 15 - 41 U/L   ALT 22 14 - 54 U/L   Alkaline Phosphatase 87 38 - 126 U/L   Total Bilirubin 0.7 0.3 - 1.2 mg/dL   GFR calc non Af Amer >60 >60 mL/min   GFR calc Af Amer >60 >60 mL/min   Anion gap 13 5 - 15  CBC     Status: None   Collection Time: 04/01/16  5:30 PM  Result Value Ref Range   WBC 8.9 4.0 - 10.5 K/uL   RBC 4.57 3.87 - 5.11  MIL/uL   Hemoglobin 14.5 12.0 - 15.0 g/dL   HCT 41.9 36.0 - 46.0 %   MCV 91.7 78.0 -  100.0 fL   MCH 31.7 26.0 - 34.0 pg   MCHC 34.6 30.0 - 36.0 g/dL   RDW 14.9 11.5 - 15.5 %   Platelets 236 150 - 400 K/uL  Troponin I     Status: Abnormal   Collection Time: 04/01/16  5:30 PM  Result Value Ref Range   Troponin I 0.11 (HH) <0.03 ng/mL  Troponin I     Status: Abnormal   Collection Time: 04/01/16 10:06 PM  Result Value Ref Range   Troponin I 0.16 (HH) <0.03 ng/mL  Troponin I     Status: Abnormal   Collection Time: 04/02/16  1:32 AM  Result Value Ref Range   Troponin I 0.14 (HH) <0.03 ng/mL     Radiology/Studies: Dg Abd Acute W/chest  Result Date: 04/01/2016 CLINICAL DATA:  Abdominal pain, vomiting EXAM: DG ABDOMEN ACUTE W/ 1V CHEST COMPARISON:  07/24/2015 FINDINGS: Cardiomediastinal silhouette is stable. Mild lower thoracic dextroscoliosis. No acute infiltrate or pleural effusion. No pulmonary edema. There is levoscoliosis of lumbar spine. Normal small bowel gas pattern. No free abdominal air. Degenerative changes are noted lumbar spine. Post tubal ligation surgical clips are noted. IMPRESSION: No acute disease within chest. Normal small bowel gas pattern. Thoracolumbar scoliosis. No free abdominal air. Electronically Signed   By: Lahoma Crocker M.D.   On: 04/01/2016 18:10   Ct Angio Chest Aorta W And/or Wo Contrast  Addendum Date: 04/01/2016   ADDENDUM REPORT: 04/01/2016 23:41 ADDENDUM: Additional images performed following administration of additional 50 cc contrast for evaluation of the aorta. There is mild atherosclerotic calcification of the thoracic aorta. The ascending aorta measures approximately 3.5 cm in diameter. No aneurysmal dilatation or evidence of dissection. There is ectasia of the distal descending aorta measuring 2.8 cm in diameter. The origins of the great vessels of the aortic arch appear patent. There is intimal thickening of the origin of the left common carotid artery with  minimal narrowing of the lumen of the vessel. Electronically Signed   By: Anner Crete M.D.   On: 04/01/2016 23:41   Result Date: 04/01/2016 CLINICAL DATA:  60 year old female with vomiting, and hypertension. EXAM: CT ANGIOGRAPHY CHEST WITH CONTRAST TECHNIQUE: Multidetector CT imaging of the chest was performed using the standard protocol during bolus administration of intravenous contrast. Multiplanar CT image reconstructions and MIPs were obtained to evaluate the vascular anatomy. CONTRAST:  100 cc Isovue 370 COMPARISON:  Chest radiograph dated 04/01/2016 FINDINGS: There is emphysematous changes of the lungs. No focal consolidation, pleural effusion, or pneumothorax. The lungs are hyperexpanded with flattening of the diaphragms. The central airways are patent. There is moderate atherosclerotic calcification of the thoracic aorta. There is no aneurysmal dilatation. Evaluation of the aorta is limited due to suboptimal opacification and timing of the contrast. There is no CT evidence of pulmonary embolism. There is no cardiomegaly or pericardial effusion. Left ventricular hypertrophy. There is no hilar or mediastinal adenopathy. The esophagus is grossly unremarkable. No thyroid nodules identified. There is no axillary adenopathy. There is loss of subcutaneous soft tissue fat and cachexia. The osseous structures appear intact. Partially visualized low attenuating enlargement of the adrenal glands, likely underlying adenoma as seen on the CT of the abdomen pelvis. The visualized upper abdomen is otherwise unremarkable. Review of the MIP images confirms the above findings. IMPRESSION: No acute intrathoracic pathology. No CT evidence of pulmonary embolism. Emphysema. Cachexia. Electronically Signed: By: Anner Crete M.D. On: 04/01/2016 22:54   Ct Renal Stone Study  Result Date: 04/01/2016 CLINICAL DATA:  Right flank pain.  Nausea and hematuria EXAM: CT ABDOMEN AND PELVIS WITHOUT CONTRAST TECHNIQUE:  Multidetector CT imaging of the abdomen and pelvis was performed following the standard protocol without IV contrast. COMPARISON:  02/24/2016 FINDINGS: Lower chest: No acute abnormality. Hepatobiliary: No focal liver abnormality is seen. No gallstones, gallbladder wall thickening, or biliary dilatation. Pancreas: Unremarkable. No pancreatic ductal dilatation or surrounding inflammatory changes. Spleen: Normal in size without focal abnormality. Adrenals/Urinary Tract: Low-attenuation enlargement of the adrenal glands again noted likely reflecting underlying adenomas. No kidney stones or hydronephrosis identified. The urinary bladder appears collapsed. No bladder calculi noted. Stomach/Bowel: Moderate distension of the gastric lumen. No pathologic dilatation of the large or small bowel loops. Vascular/Lymphatic: Aortic atherosclerosis is identified. No aneurysm. No evidence for abdominal or pelvic adenopathy. Reproductive: Uterus and bilateral adnexa are unremarkable. Other: No abdominal wall hernia or abnormality. No abdominopelvic ascites. Musculoskeletal: There is a scoliosis deformity involving the lumbar spine which is convex towards the left. Multi level degenerative disc disease identified within the lumbar spine. No aggressive lytic or sclerotic bone lesions. IMPRESSION: 1. No kidney stones or evidence of hydronephrosis. 2. Aortic atherosclerosis. Electronically Signed   By: Kerby Moors M.D.   On: 04/01/2016 19:23    EKG: normal sinus rhythm, biatrial enlargement, LVH by voltage criteria Echo; none  Cardiac cath: none  Medical decision making:  Discussed care with the patient Discussed care with the physician on the phone Reviewed labs and imaging personally Reviewed prior records  ASSESSMENT AND PLAN:  This is a 60 y.o. AAF w/ history of adrenal incidentaloma, HTN (dx 2 yrs ago), tobacco abuse presented with intractable nausea and vomiting. She was not able to take her BP meds and developed  systolic BP >428.  Active Problems:   Hypertensive emergency  Intractable nausea/vomiting Unable to tolerate PO - zofran prn, soft diet  Hypertensive emergency 2/2 unable to take oral antihypertensive, prior history of adrenal hyperplasia/nodularity by CT scan known left adrenal mass measuring 16 x 33 mm Elevated troponin in the setting of hypertensive emergency, non specific EKG changes in the setting of LVH by EKG - cycle troponin, consider starting on aspirin and statin - in the setting of hypertensive emergency with indeterminate levels of troponin which as essentially flat, the risk of starting IV heparin for intracranial hemorrhage is significant - echocardiogram in the morning - treat HTN, screen for secondary causes of hypertension (ordered renal artery U/S, renin-aldosterone ratio, cortisol, metanephrines). Could consider stress test at discharge if there is still concern about ischemia - continue lisinopril-hctz   Tobacco abuse Counseled for tobacco use cessation, nicotine patch prn  History of shingles On acyclovir, last outbreak in 01/2016  Hypokalemia 2/2 HCTZ Replace potassium  Protein energy malnutrition 2/2 poor oral intake secondary to shingles Outpatient f/u with PCP for cancer screening Dietician consult  Signed, Flossie Dibble, MD MS 04/02/2016, 4:00 AM

## 2016-04-02 NOTE — ED Provider Notes (Signed)
Pt awaiting bed BP difficult to control Will d/c NTG drip and start labetalol drip   Ripley Fraise, MD 04/02/16 [phone]

## 2016-04-02 NOTE — Progress Notes (Signed)
Initial Nutrition Assessment  DOCUMENTATION CODES:   Severe malnutrition in context of chronic illness, Underweight  INTERVENTION:  - Will order Boost Breeze po TID, each supplement provides 250 kcal and 9 grams of protein - Will order daily multivitamin with minerals. - Encourage PO intakes of meals and supplements.  NUTRITION DIAGNOSIS:   Malnutrition related to chronic illness as evidenced by severe depletion of muscle mass, severe depletion of body fat.  GOAL:   Patient will meet greater than or equal to 90% of their needs  MONITOR:   PO intake, Supplement acceptance, Weight trends, Labs, I & O's  REASON FOR ASSESSMENT:   Malnutrition Screening Tool, Consult Poor PO  ASSESSMENT:   60 y.o. AAF w/ history of adrenal incidentaloma, HTN (dx 2 yrs ago), tobacco abuse presented with intractable nausea and vomiting. She was not able to take her BP meds and developed systolic BP >353. These symptoms started on Friday night. She had associated abdominal cramps. She was unable to tolerate PO. She went to [facility] where her systolics were high so she was started on NTG gtt and labetalol gtt requiring ICU care.  Pt seen for MST and consult. BMI indicates underweight status. Pt on Heart Healthy diet and to be NPO at midnight tonight. Breakfast tray on bedside table and is untouched other than 100% completion of cup of orange juice. Pt states that she was able to consume orange juice without issue. She tried to drink coffee and take a bite of grits but began to feel more nauseated; did not attempted to try pancakes or scrambled eggs. Pt states that N/V started on 9/7 PM and that nausea is ongoing but that last episode of emesis was 9/8 PM.   Pt states that shingles began in July and that since that time she has felt increasingly weak, decreased appetite, and weight loss. She states that in July she weighed 94 lbs which, based on CBW, would indicate 25 lb weight loss (26.6% body  weight) in ~2 months which is significant for time frame. Severe muscle and fat wasting noted to all areas of upper body; lower body not assessed at this time as pt reports being cold and requests an extra blanket be placed.  She has tried oral nutrition supplements in the past. She does not like Boost or Ensure as they are too thick/heavy for her but she drank 2 bottles of Pediasure PTA that were given to her by a friend and enjoyed them. She states she has been interested in trying Pedialyte as well as she feels it would be less heavy. Talked with her about Boost Breeze and pt very interested in this supplement. She states she is interested in re-gaining weight.  Medications reviewed; 10 mg IV Reglan x1 dose yesterday, PRN IV Zofran.  Labs reviewed; Cl: 98 mmol/L, creatinine: 1.29 mg/dL, GFR: 51 mL/min.   Diet Order:  Diet NPO time specified Diet 2 gram sodium Room service appropriate? Yes; Fluid consistency: Thin  Skin:  Reviewed, no issues  Last BM:  9/8  Height:   Ht Readings from Last 1 Encounters:  04/01/16 '5\' 5"'$  (1.651 m)    Weight:   Wt Readings from Last 1 Encounters:  04/02/16 69 lb 10.7 oz (31.6 kg)    Ideal Body Weight:  56.82 kg  BMI:  Body mass index is 11.59 kg/m.  Estimated Nutritional Needs:   Kcal:  6144-3154 (35-40 kcal/kg)  Protein:  40-50 grams  Fluid:  >/= 1.2 L/day  EDUCATION NEEDS:   No education needs identified at this time    Jarome Matin, [place], RD, LDN Inpatient Clinical Dietitian Pager # [phone] After hours/weekend pager # [phone]

## 2016-04-02 NOTE — Consult Note (Deleted)
Marland [facility]  CARDIOLOGY INPATIENT HISTORY AND PHYSICAL EXAMINATION NOTE  Patient ID: Donna Curry MRN: [id number], DOB/AGE: September 06, 1955   Admit date: 04/01/2016   Primary Physician: Nance Pear., NP Primary Cardiologist: new  Reason for admission: uncontrolled hypertension requiring labetalol IV drip and NTG gtt  HPI: This is a 60 y.o. AAF w/ history of adrenal incidentaloma, HTN (dx 2 yrs ago), tobacco abuse presented with intractable nausea and vomiting. She was not able to take her BP meds and developed systolic BP >381. These symptoms started on Friday night. She had associated abdominal cramps. She was unable to tolerate PO. She went to [facility] where her systolics were high so she was started on NTG gtt and labetalol gtt requiring ICU care. She denied chest pain, dyspnea, PND, orthopnea, leg swelling, increased abdominal girth, syncope, presyncope, palpitations.. She also denied any fevers, chills, PE/DVT, TIA, CVA, amaurosis fugax otherwise.   Problem List: Past Medical History:  Diagnosis Date  . Hypertension   . Measles as a child  . Mumps as a child  . Tobacco abuse     Past Surgical History:  Procedure Laterality Date  . tubes tied    . uterine ablation     about 2005     Allergies: No Known Allergies   Home Medications Current Facility-Administered Medications  Medication Dose Route Frequency Provider Last Rate Last Dose  . aspirin EC tablet 81 mg  81 mg Oral Daily Flossie Dibble, MD      . atorvastatin (LIPITOR) tablet 80 mg  80 mg Oral q1800 Flossie Dibble, MD      . heparin injection 5,000 Units  5,000 Units Subcutaneous Q8H Flossie Dibble, MD      . labetalol (NORMODYNE,TRANDATE) 500 mg in dextrose 5 % 125 mL (4 mg/mL) infusion  0.5-3 mg/min Intravenous Titrated Ripley Fraise, MD 7.5 mL/hr at 04/02/16 0330 0.5 mg/min at 04/02/16 0330  . nitroGLYCERIN 50 mg in dextrose 5 % 250 mL (0.2 mg/mL) infusion  10 mcg/min Intravenous Titrated Malvin Johns, MD   Stopped at 04/02/16 0206  . sodium chloride flush (NS) 0.9 % injection 3 mL  3 mL Intravenous Q12H Flossie Dibble, MD         Family History  Problem Relation Age of Onset  . Hypertension Mother   . Diabetes Father   . Hypertension Brother   . HIV Brother      Social History   Social History  . Marital status: Single    Spouse name: N/A  . Number of children: N/A  . Years of education: N/A   Occupational History  . Not on file.   Social History Main Topics  . Smoking status: Current Some Day Smoker    Packs/day: 0.50    Types: Cigarettes    Last attempt to quit: 03/22/2013  . Smokeless tobacco: Never Used     Comment: 1/4 pack per day,Hasnt smoked in 8 days  . Alcohol use No  . Drug use:      Comment: marijuana   . Sexual activity: No   Other Topics Concern  . Not on file   Social History Narrative   Denies hx of drug use   Single   1 daughter age 31 lives with daughter and grandson who is 94.   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     Review of Systems: General: negative for chills, fever, night sweats or weight  changes.  Cardiovascular: negative for dyspnea on exertion, edema, orthopnea, palpitations, paroxysmal nocturnal dyspnea or shortness of breath  Dermatological: negative for rash Respiratory: negative for cough or wheezing Urologic: negative for hematuria Abdominal: nausea, vomiting, abdominal cramps Neurologic: negative for visual changes, syncope, or dizziness Endocrine: no diabetes, no hypothyroidism Immunological: no lymph adenopathy Psych: non homicidal/suicidal  Physical Exam: Vitals: BP (!) 162/111   Pulse 80   Temp 98.3 F (36.8 C)   Resp 12   Ht '5\' 5"'$  (1.651 m)   Wt 31.6 kg (69 lb 10.7 oz)   SpO2 100%   BMI 11.59 kg/m  General: not in acute distress, cachectic looking female  Neck: JVP flat, neck supple Heart: regular rate and rhythm, S1, S2, no murmurs  Lungs: CTAB  GI: non  tender, non distended, bowel sounds present Extremities: no edema Neuro: AAO x 3  Psych: normal affect, no anxiety   Labs:   Results for orders placed or performed during the hospital encounter of 04/01/16 (from the past 24 hour(s))  Urinalysis, Routine w reflex microscopic     Status: Abnormal   Collection Time: 04/01/16  5:20 PM  Result Value Ref Range   Color, Urine YELLOW YELLOW   APPearance CLEAR CLEAR   Specific Gravity, Urine 1.015 1.005 - 1.030   pH 7.5 5.0 - 8.0   Glucose, UA 100 (A) NEGATIVE mg/dL   Hgb urine dipstick LARGE (A) NEGATIVE   Bilirubin Urine NEGATIVE NEGATIVE   Ketones, ur NEGATIVE NEGATIVE mg/dL   Protein, ur >300 (A) NEGATIVE mg/dL   Nitrite NEGATIVE NEGATIVE   Leukocytes, UA NEGATIVE NEGATIVE  Urine microscopic-add on     Status: Abnormal   Collection Time: 04/01/16  5:20 PM  Result Value Ref Range   Squamous Epithelial / LPF 0-5 (A) NONE SEEN   WBC, UA 0-5 0 - 5 WBC/hpf   RBC / HPF TOO NUMEROUS TO COUNT 0 - 5 RBC/hpf   Bacteria, UA RARE (A) NONE SEEN  Lipase, blood     Status: None   Collection Time: 04/01/16  5:30 PM  Result Value Ref Range   Lipase 20 11 - 51 U/L  Comprehensive metabolic panel     Status: Abnormal   Collection Time: 04/01/16  5:30 PM  Result Value Ref Range   Sodium 139 135 - 145 mmol/L   Potassium 3.1 (L) 3.5 - 5.1 mmol/L   Chloride 99 (L) 101 - 111 mmol/L   CO2 27 22 - 32 mmol/L   Glucose, Bld 128 (H) 65 - 99 mg/dL   BUN 15 6 - 20 mg/dL   Creatinine, Ser 0.90 0.44 - 1.00 mg/dL   Calcium 9.6 8.9 - 10.3 mg/dL   Total Protein 8.6 (H) 6.5 - 8.1 g/dL   Albumin 4.3 3.5 - 5.0 g/dL   AST 37 15 - 41 U/L   ALT 22 14 - 54 U/L   Alkaline Phosphatase 87 38 - 126 U/L   Total Bilirubin 0.7 0.3 - 1.2 mg/dL   GFR calc non Af Amer >60 >60 mL/min   GFR calc Af Amer >60 >60 mL/min   Anion gap 13 5 - 15  CBC     Status: None   Collection Time: 04/01/16  5:30 PM  Result Value Ref Range   WBC 8.9 4.0 - 10.5 K/uL   RBC 4.57 3.87 - 5.11  MIL/uL   Hemoglobin 14.5 12.0 - 15.0 g/dL   HCT 41.9 36.0 - 46.0 %   MCV 91.7 78.0 -  100.0 fL   MCH 31.7 26.0 - 34.0 pg   MCHC 34.6 30.0 - 36.0 g/dL   RDW 14.9 11.5 - 15.5 %   Platelets 236 150 - 400 K/uL  Troponin I     Status: Abnormal   Collection Time: 04/01/16  5:30 PM  Result Value Ref Range   Troponin I 0.11 (HH) <0.03 ng/mL  Troponin I     Status: Abnormal   Collection Time: 04/01/16 10:06 PM  Result Value Ref Range   Troponin I 0.16 (HH) <0.03 ng/mL  Troponin I     Status: Abnormal   Collection Time: 04/02/16  1:32 AM  Result Value Ref Range   Troponin I 0.14 (HH) <0.03 ng/mL     Radiology/Studies: Dg Abd Acute W/chest  Result Date: 04/01/2016 CLINICAL DATA:  Abdominal pain, vomiting EXAM: DG ABDOMEN ACUTE W/ 1V CHEST COMPARISON:  07/24/2015 FINDINGS: Cardiomediastinal silhouette is stable. Mild lower thoracic dextroscoliosis. No acute infiltrate or pleural effusion. No pulmonary edema. There is levoscoliosis of lumbar spine. Normal small bowel gas pattern. No free abdominal air. Degenerative changes are noted lumbar spine. Post tubal ligation surgical clips are noted. IMPRESSION: No acute disease within chest. Normal small bowel gas pattern. Thoracolumbar scoliosis. No free abdominal air. Electronically Signed   By: Lahoma Crocker M.D.   On: 04/01/2016 18:10   Ct Angio Chest Aorta W And/or Wo Contrast  Addendum Date: 04/01/2016   ADDENDUM REPORT: 04/01/2016 23:41 ADDENDUM: Additional images performed following administration of additional 50 cc contrast for evaluation of the aorta. There is mild atherosclerotic calcification of the thoracic aorta. The ascending aorta measures approximately 3.5 cm in diameter. No aneurysmal dilatation or evidence of dissection. There is ectasia of the distal descending aorta measuring 2.8 cm in diameter. The origins of the great vessels of the aortic arch appear patent. There is intimal thickening of the origin of the left common carotid artery with  minimal narrowing of the lumen of the vessel. Electronically Signed   By: Anner Crete M.D.   On: 04/01/2016 23:41   Result Date: 04/01/2016 CLINICAL DATA:  60 year old female with vomiting, and hypertension. EXAM: CT ANGIOGRAPHY CHEST WITH CONTRAST TECHNIQUE: Multidetector CT imaging of the chest was performed using the standard protocol during bolus administration of intravenous contrast. Multiplanar CT image reconstructions and MIPs were obtained to evaluate the vascular anatomy. CONTRAST:  100 cc Isovue 370 COMPARISON:  Chest radiograph dated 04/01/2016 FINDINGS: There is emphysematous changes of the lungs. No focal consolidation, pleural effusion, or pneumothorax. The lungs are hyperexpanded with flattening of the diaphragms. The central airways are patent. There is moderate atherosclerotic calcification of the thoracic aorta. There is no aneurysmal dilatation. Evaluation of the aorta is limited due to suboptimal opacification and timing of the contrast. There is no CT evidence of pulmonary embolism. There is no cardiomegaly or pericardial effusion. Left ventricular hypertrophy. There is no hilar or mediastinal adenopathy. The esophagus is grossly unremarkable. No thyroid nodules identified. There is no axillary adenopathy. There is loss of subcutaneous soft tissue fat and cachexia. The osseous structures appear intact. Partially visualized low attenuating enlargement of the adrenal glands, likely underlying adenoma as seen on the CT of the abdomen pelvis. The visualized upper abdomen is otherwise unremarkable. Review of the MIP images confirms the above findings. IMPRESSION: No acute intrathoracic pathology. No CT evidence of pulmonary embolism. Emphysema. Cachexia. Electronically Signed: By: Anner Crete M.D. On: 04/01/2016 22:54   Ct Renal Stone Study  Result Date: 04/01/2016 CLINICAL DATA:  Right flank pain.  Nausea and hematuria EXAM: CT ABDOMEN AND PELVIS WITHOUT CONTRAST TECHNIQUE:  Multidetector CT imaging of the abdomen and pelvis was performed following the standard protocol without IV contrast. COMPARISON:  02/24/2016 FINDINGS: Lower chest: No acute abnormality. Hepatobiliary: No focal liver abnormality is seen. No gallstones, gallbladder wall thickening, or biliary dilatation. Pancreas: Unremarkable. No pancreatic ductal dilatation or surrounding inflammatory changes. Spleen: Normal in size without focal abnormality. Adrenals/Urinary Tract: Low-attenuation enlargement of the adrenal glands again noted likely reflecting underlying adenomas. No kidney stones or hydronephrosis identified. The urinary bladder appears collapsed. No bladder calculi noted. Stomach/Bowel: Moderate distension of the gastric lumen. No pathologic dilatation of the large or small bowel loops. Vascular/Lymphatic: Aortic atherosclerosis is identified. No aneurysm. No evidence for abdominal or pelvic adenopathy. Reproductive: Uterus and bilateral adnexa are unremarkable. Other: No abdominal wall hernia or abnormality. No abdominopelvic ascites. Musculoskeletal: There is a scoliosis deformity involving the lumbar spine which is convex towards the left. Multi level degenerative disc disease identified within the lumbar spine. No aggressive lytic or sclerotic bone lesions. IMPRESSION: 1. No kidney stones or evidence of hydronephrosis. 2. Aortic atherosclerosis. Electronically Signed   By: Kerby Moors M.D.   On: 04/01/2016 19:23    EKG: normal sinus rhythm, biatrial enlargement, LVH by voltage criteria Echo; none  Cardiac cath: none  Medical decision making:  Discussed care with the patient Discussed care with the physician on the phone Reviewed labs and imaging personally Reviewed prior records  ASSESSMENT AND PLAN:  This is a 60 y.o. AAF w/ history of adrenal incidentaloma, HTN (dx 2 yrs ago), tobacco abuse presented with intractable nausea and vomiting. She was not able to take her BP meds and developed  systolic BP >686.  Active Problems:   Hypertensive emergency  Intractable nausea/vomiting Unable to tolerate PO - zofran prn, soft diet  Hypertensive emergency 2/2 unable to take oral antihypertensive, prior history of adrenal hyperplasia/nodularity by CT scan known left adrenal mass measuring 16 x 33 mm Elevated troponin in the setting of hypertensive emergency, non specific EKG changes in the setting of LVH by EKG - cycle troponin, consider starting on aspirin and statin - in the setting of hypertensive emergency with indeterminate levels of troponin which as essentially flat, the risk of starting IV heparin for intracranial hemorrhage is significant - echocardiogram in the morning - treat HTN, screen for secondary causes of hypertension (ordered renal artery U/S, renin-aldosterone ratio, cortisol, metanephrines). Could consider stress test at discharge if there is still concern about ischemia - continue lisinopril-hctz   Tobacco abuse Counseled for tobacco use cessation, nicotine patch prn  History of shingles On acyclovir, last outbreak in 01/2016  Hypokalemia 2/2 HCTZ Replace potassium  Signed, Flossie Dibble, MD MS 04/02/2016, 4:00 AM

## 2016-04-02 NOTE — ED Notes (Signed)
Pt sitting up in bed gagging.  She states eating ice chips make her nauseated.

## 2016-04-02 NOTE — Progress Notes (Signed)
   Patient seen in consultation early today.   Plan as per Dr. Eula Fried.  We will see again tomorrow.

## 2016-04-03 ENCOUNTER — Inpatient Hospital Stay (HOSPITAL_COMMUNITY): Payer: Self-pay

## 2016-04-03 DIAGNOSIS — I425 Other restrictive cardiomyopathy: Secondary | ICD-10-CM

## 2016-04-03 DIAGNOSIS — I771 Stricture of artery: Secondary | ICD-10-CM

## 2016-04-03 LAB — GLUCOSE, CAPILLARY: Glucose-Capillary: 114 mg/dL — ABNORMAL HIGH (ref 65–99)

## 2016-04-03 LAB — ECHOCARDIOGRAM COMPLETE
Height: 65 in
Weight: 1118.17 oz

## 2016-04-03 LAB — HEMOGLOBIN A1C
Hgb A1c MFr Bld: 5.7 % — ABNORMAL HIGH (ref 4.8–5.6)
Mean Plasma Glucose: 117 mg/dL

## 2016-04-03 MED ORDER — SODIUM CHLORIDE 0.9 % IV SOLN
Freq: Once | INTRAVENOUS | Status: AC
  Administered 2016-04-03: 250 mL/h via INTRAVENOUS

## 2016-04-03 MED ORDER — GABAPENTIN 300 MG PO CAPS
300.0000 mg | ORAL_CAPSULE | Freq: Every day | ORAL | Status: DC
  Administered 2016-04-04: 300 mg via ORAL
  Filled 2016-04-03: qty 1

## 2016-04-03 MED ORDER — LISINOPRIL 10 MG PO TABS
10.0000 mg | ORAL_TABLET | Freq: Every day | ORAL | Status: DC
  Administered 2016-04-04 – 2016-04-05 (×2): 10 mg via ORAL
  Filled 2016-04-03 (×2): qty 1

## 2016-04-03 MED ORDER — SODIUM CHLORIDE 0.9 % IV SOLN
Freq: Once | INTRAVENOUS | Status: AC
  Administered 2016-04-03: 500 mL via INTRAVENOUS

## 2016-04-03 MED ORDER — HYDROCHLOROTHIAZIDE 12.5 MG PO CAPS
12.5000 mg | ORAL_CAPSULE | Freq: Every day | ORAL | Status: DC
  Administered 2016-04-03: 12.5 mg via ORAL
  Filled 2016-04-03: qty 1

## 2016-04-03 MED ORDER — HYDROCHLOROTHIAZIDE 12.5 MG PO CAPS
12.5000 mg | ORAL_CAPSULE | Freq: Every day | ORAL | Status: DC
  Administered 2016-04-04 – 2016-04-05 (×2): 12.5 mg via ORAL
  Filled 2016-04-03 (×2): qty 1

## 2016-04-03 MED ORDER — LISINOPRIL 10 MG PO TABS
10.0000 mg | ORAL_TABLET | Freq: Every day | ORAL | Status: DC
  Administered 2016-04-03: 10 mg via ORAL
  Filled 2016-04-03: qty 1

## 2016-04-03 NOTE — Progress Notes (Signed)
Patient Name: Donna Curry Date of Encounter: 04/03/2016  Hospital Problem List     Active Problems:   TOBACCO ABUSE   Essential hypertension   Adrenal mass [facility])   Hypertensive emergency   Hypokalemia   Protein-energy malnutrition [facility])    Patient Profile     This is a 60 y.o. AAF w/ history of adrenal incidentaloma, HTN (dx 2 yrs ago), tobacco abuse presented with intractable nausea and vomiting. She was not able to take her BP meds and developed systolic BP >629.  Subjective   N/V has improved.  She does have some back pain which has been chronic.   Inpatient Medications    . acyclovir  400 mg Oral QID  . aspirin EC  81 mg Oral Daily  . atorvastatin  80 mg Oral q1800  . feeding supplement  1 Container Oral TID BM  . gabapentin  300 mg Oral BID  . heparin  5,000 Units Subcutaneous Q8H  . lisinopril  20 mg Oral Daily   And  . hydrochlorothiazide  12.5 mg Oral Daily  . meloxicam  7.5 mg Oral Daily  . multivitamin with minerals  1 tablet Oral Daily  . sodium chloride flush  3 mL Intravenous Q12H    Vital Signs    Vitals:   04/03/16 0300 04/03/16 0400 04/03/16 0500 04/03/16 0600  BP: (!) 84/66 (!) '91/54 91/61 90/70 '$  Pulse: 81 84 77 78  Resp: '11 17 15 16  '$ Temp:  98.5 F (36.9 C)    TempSrc:  Oral    SpO2: 98% 98% 94% 94%  Weight:   69 lb 14.2 oz (31.7 kg)   Height:        Intake/Output Summary (Last 24 hours) at 04/03/16 0753 Last data filed at 04/02/16 1700  Gross per 24 hour  Intake              600 ml  Output                0 ml  Net              600 ml   Filed Weights   04/01/16 1630 04/02/16 0342 04/03/16 0500  Weight: 98 lb (44.5 kg) 69 lb 10.7 oz (31.6 kg) 69 lb 14.2 oz (31.7 kg)    Physical Exam    GEN: Malnourished, well developed, in no no acute distress.  Neck: Supple, no JVD, carotid bruits, or masses. Cardiac: RRR, no rubs, or gallops. No clubbing, cyanosis, no edema.  Radials/DP/PT 2+ and equal bilaterally.  Respiratory:   Respirations  regular and unlabored, clear to auscultation bilaterally. GI: Soft, nontender, nondistended, BS + x 4. Neuro:  Strength and sensation are intact.   Labs    CBC  Recent Labs  04/01/16 1730 04/02/16 0430  WBC 8.9 6.8  HGB 14.5 15.0  HCT 41.9 44.3  MCV 91.7 92.7  PLT 236 528   Basic Metabolic Panel  Recent Labs  04/01/16 1730 04/02/16 0430  NA 139 138  K 3.1* 3.5  CL 99* 98*  CO2 27 25  GLUCOSE 128* 126*  BUN 15 19  CREATININE 0.90 1.29*  CALCIUM 9.6 9.8   Liver Function Tests  Recent Labs  04/01/16 1730 04/02/16 0430  AST 37 31  ALT 22 22  ALKPHOS 87 88  BILITOT 0.7 0.7  PROT 8.6* 7.7  ALBUMIN 4.3 3.8    Recent Labs  04/01/16 1730  LIPASE 20   Cardiac Enzymes  Recent Labs  04/02/16 0430 04/02/16 0920 04/02/16 1523  TROPONINI 0.11* 0.07* 0.06*   BNP Invalid input(s): POCBNP D-Dimer No results for input(s): DDIMER in the last 72 hours. Hemoglobin A1C No results for input(s): HGBA1C in the last 72 hours. Fasting Lipid Panel  Recent Labs  04/02/16 0430  CHOL 218*  HDL 85  LDLCALC 115*  TRIG 91  CHOLHDL 2.6   Thyroid Function Tests  Recent Labs  04/02/16 0430  TSH 1.668    Telemetry    NSR  ECG    NA  Radiology    Dg Abd Acute W/chest  Result Date: 04/01/2016 CLINICAL DATA:  Abdominal pain, vomiting EXAM: DG ABDOMEN ACUTE W/ 1V CHEST COMPARISON:  07/24/2015 FINDINGS: Cardiomediastinal silhouette is stable. Mild lower thoracic dextroscoliosis. No acute infiltrate or pleural effusion. No pulmonary edema. There is levoscoliosis of lumbar spine. Normal small bowel gas pattern. No free abdominal air. Degenerative changes are noted lumbar spine. Post tubal ligation surgical clips are noted. IMPRESSION: No acute disease within chest. Normal small bowel gas pattern. Thoracolumbar scoliosis. No free abdominal air. Electronically Signed   By: Lahoma Crocker M.D.   On: 04/01/2016 18:10   Ct Angio Chest Aorta W And/or Wo  Contrast  Addendum Date: 04/01/2016   ADDENDUM REPORT: 04/01/2016 23:41 ADDENDUM: Additional images performed following administration of additional 50 cc contrast for evaluation of the aorta. There is mild atherosclerotic calcification of the thoracic aorta. The ascending aorta measures approximately 3.5 cm in diameter. No aneurysmal dilatation or evidence of dissection. There is ectasia of the distal descending aorta measuring 2.8 cm in diameter. The origins of the great vessels of the aortic arch appear patent. There is intimal thickening of the origin of the left common carotid artery with minimal narrowing of the lumen of the vessel. Electronically Signed   By: Anner Crete M.D.   On: 04/01/2016 23:41   Result Date: 04/01/2016 CLINICAL DATA:  60 year old female with vomiting, and hypertension. EXAM: CT ANGIOGRAPHY CHEST WITH CONTRAST TECHNIQUE: Multidetector CT imaging of the chest was performed using the standard protocol during bolus administration of intravenous contrast. Multiplanar CT image reconstructions and MIPs were obtained to evaluate the vascular anatomy. CONTRAST:  100 cc Isovue 370 COMPARISON:  Chest radiograph dated 04/01/2016 FINDINGS: There is emphysematous changes of the lungs. No focal consolidation, pleural effusion, or pneumothorax. The lungs are hyperexpanded with flattening of the diaphragms. The central airways are patent. There is moderate atherosclerotic calcification of the thoracic aorta. There is no aneurysmal dilatation. Evaluation of the aorta is limited due to suboptimal opacification and timing of the contrast. There is no CT evidence of pulmonary embolism. There is no cardiomegaly or pericardial effusion. Left ventricular hypertrophy. There is no hilar or mediastinal adenopathy. The esophagus is grossly unremarkable. No thyroid nodules identified. There is no axillary adenopathy. There is loss of subcutaneous soft tissue fat and cachexia. The osseous structures appear  intact. Partially visualized low attenuating enlargement of the adrenal glands, likely underlying adenoma as seen on the CT of the abdomen pelvis. The visualized upper abdomen is otherwise unremarkable. Review of the MIP images confirms the above findings. IMPRESSION: No acute intrathoracic pathology. No CT evidence of pulmonary embolism. Emphysema. Cachexia. Electronically Signed: By: Anner Crete M.D. On: 04/01/2016 22:54   Ct Renal Stone Study  Result Date: 04/01/2016 CLINICAL DATA:  Right flank pain.  Nausea and hematuria EXAM: CT ABDOMEN AND PELVIS WITHOUT CONTRAST TECHNIQUE: Multidetector CT imaging of the abdomen and pelvis was performed following  the standard protocol without IV contrast. COMPARISON:  02/24/2016 FINDINGS: Lower chest: No acute abnormality. Hepatobiliary: No focal liver abnormality is seen. No gallstones, gallbladder wall thickening, or biliary dilatation. Pancreas: Unremarkable. No pancreatic ductal dilatation or surrounding inflammatory changes. Spleen: Normal in size without focal abnormality. Adrenals/Urinary Tract: Low-attenuation enlargement of the adrenal glands again noted likely reflecting underlying adenomas. No kidney stones or hydronephrosis identified. The urinary bladder appears collapsed. No bladder calculi noted. Stomach/Bowel: Moderate distension of the gastric lumen. No pathologic dilatation of the large or small bowel loops. Vascular/Lymphatic: Aortic atherosclerosis is identified. No aneurysm. No evidence for abdominal or pelvic adenopathy. Reproductive: Uterus and bilateral adnexa are unremarkable. Other: No abdominal wall hernia or abnormality. No abdominopelvic ascites. Musculoskeletal: There is a scoliosis deformity involving the lumbar spine which is convex towards the left. Multi level degenerative disc disease identified within the lumbar spine. No aggressive lytic or sclerotic bone lesions. IMPRESSION: 1. No kidney stones or evidence of hydronephrosis. 2.  Aortic atherosclerosis. Electronically Signed   By: Kerby Moors M.D.   On: 04/01/2016 19:23    Assessment & Plan    HTN:  BP is now low.   I am going to reduce the lisinopril/hct.    N/V:  This has resolved.  No clear etiology.    ELEVATED TROPONIN:  Echo is pending.  BNP is slightly elevated. Troponin trend flat.  Likely out patient stress perfusion study.   TOBACCO ABUSE:  Educated.   Need to check echo and ambulate.  Probably home in the AM.  Signed, Minus Breeding, MD  04/03/2016, 7:53 AM

## 2016-04-03 NOTE — Progress Notes (Signed)
*  PRELIMINARY RESULTS* Vascular Ultrasound Renal Artery Duplex has been completed.   The right renal artery exhibits elevated velocities suggestive of 1-59% stenosis by peak velocity, and >60% stenosis by renal/aorta ratio. Right intrarenal arteries are patent. The left renal artery does not exhibit any obvious evidence of hemodynamically significant stenosis.  04/03/2016 9:38 AM Maudry Mayhew, BS, RVT, RDCS, RDMS

## 2016-04-03 NOTE — Progress Notes (Addendum)
Pt bp in the morning was in the 80s. Per MD okay to give BP meds. Now, pt blood dropped to 64/48, rechecked manually with bp of 62/52. Paged on-call PA and received order of 500cc normal saline bolus and cancelled transfer order. Patient is asymptomatic, resting comfortably in chair. Will continue to monitor closely.

## 2016-04-03 NOTE — Progress Notes (Signed)
  Echocardiogram 2D Echocardiogram has been performed.  Donna Curry 04/03/2016, 3:30 PM

## 2016-04-03 NOTE — Progress Notes (Signed)
Called by RN- B/P in 56L systolic after her B/P medications resumed. Hill write for hold parameters and fluid bolus.  Kerin Ransom PA-C 04/03/2016 11:49 AM

## 2016-04-04 DIAGNOSIS — F17200 Nicotine dependence, unspecified, uncomplicated: Secondary | ICD-10-CM

## 2016-04-04 LAB — MRSA CULTURE: Culture: NEGATIVE

## 2016-04-04 NOTE — Progress Notes (Signed)
Patient lying in bed, daughter present at bedside. No other needs at this time, call light within reach.

## 2016-04-04 NOTE — Care Management Note (Signed)
Case Management Note  Patient Details  Name: UNKNOWN FLANNIGAN MRN: [id number] Date of Birth: 07-25-1956  Subjective/Objective:          Adm w hypertension          Action/Plan: lives at home w fam, pcp Earlie Counts   Expected Discharge Date:                  Expected Discharge Plan:  Home/Self Care  In-House Referral:     Discharge planning Services     Post Acute Care Choice:    Choice offered to:     DME Arranged:    DME Agency:     HH Arranged:    [facility] Agency:     Status of Service:  In process, will continue to follow  If discussed at Long Length of Stay Meetings, dates discussed:    Additional Comments:  Lacretia Leigh, RN 04/04/2016, 10:56 AM

## 2016-04-04 NOTE — Progress Notes (Signed)
     Subjective:  No CP/SOB, BP better controlled  Objective:  Temp:  [97.9 F (36.6 C)-98.6 F (37 C)] 98.6 F (37 C) (09/11 0400) Pulse Rate:  [63-82] 75 (09/11 0400) Resp:  [8-18] 14 (09/11 0300) BP: (62-122)/(40-81) 122/81 (09/11 0400) SpO2:  [93 %-100 %] 100 % (09/11 0400) Weight:  [72 lb 1.5 oz (32.7 kg)] 72 lb 1.5 oz (32.7 kg) (09/11 0400) Weight change: 2 lb 3.3 oz (1 kg)  Intake/Output from previous day: 09/10 0701 - 09/11 0700 In: 990 [P.O.:990] Out: 250 [Urine:250]  Intake/Output from this shift: No intake/output data recorded.  Physical Exam: General appearance: alert and no distress Neck: no adenopathy, no carotid bruit, no JVD, supple, symmetrical, trachea midline and thyroid not enlarged, symmetric, no tenderness/mass/nodules Lungs: clear to auscultation bilaterally Heart: regular rate and rhythm, S1, S2 normal, no murmur, click, rub or gallop Extremities: extremities normal, atraumatic, no cyanosis or edema  Lab Results: Results for orders placed or performed during the hospital encounter of 04/01/16 (from the past 48 hour(s))  Glucose, capillary     Status: Abnormal   Collection Time: 04/02/16  9:14 AM  Result Value Ref Range   Glucose-Capillary 151 (H) 65 - 99 mg/dL  Troponin I     Status: Abnormal   Collection Time: 04/02/16  9:20 AM  Result Value Ref Range   Troponin I 0.07 (HH) <0.03 ng/mL    Comment: CRITICAL VALUE NOTED.  VALUE IS CONSISTENT WITH PREVIOUSLY REPORTED AND CALLED VALUE.  Troponin I     Status: Abnormal   Collection Time: 04/02/16  3:23 PM  Result Value Ref Range   Troponin I 0.06 (HH) <0.03 ng/mL    Comment: CRITICAL VALUE NOTED.  VALUE IS CONSISTENT WITH PREVIOUSLY REPORTED AND CALLED VALUE.  Glucose, capillary     Status: Abnormal   Collection Time: 04/03/16  7:33 AM  Result Value Ref Range   Glucose-Capillary 114 (H) 65 - 99 mg/dL   Comment 1 Capillary Specimen     Imaging: Imaging results have been reviewed  Tele-  NSR  Assessment/Plan:   1. Active Problems: 2.   TOBACCO ABUSE 3.   Essential hypertension 4.   Adrenal mass ([place]) 5.   Hypertensive emergency 6.   Hypokalemia 7.   Protein-energy malnutrition ([place]) 8.   Protein-calorie malnutrition, severe 9.   Time Spent Directly with Patient:  20 minutes  Length of Stay:  LOS: 3 days   Pt admitted with HTN urgency with SBP 220 prob from not taking her BP meds secondary to N/V (unclear etiol). Sx resolved and BP under excellent control on home meds (adjusted). 2D shows  Nl LV fxn, small cavity with concentric LVH. BNP mod elevated. Trop low and flat. OK to Tx tele. Ambulate. Prob home tomorrow.   Quay Burow 04/04/2016, 8:58 AM

## 2016-04-05 LAB — ALDOSTERONE + RENIN ACTIVITY W/ RATIO
ALDO / PRA Ratio: 8.4 (ref 0.0–30.0)
Aldosterone: 3.2 ng/dL (ref 0.0–30.0)
PRA LC/MS/MS: 0.383 ng/mL/hr (ref 0.167–5.380)

## 2016-04-05 MED ORDER — HYDROCHLOROTHIAZIDE 12.5 MG PO CAPS: 12.5000 mg | ORAL_CAPSULE | Freq: Every day | ORAL | 11 refills | Status: DC

## 2016-04-05 MED ORDER — ASPIRIN 81 MG PO TBEC: 81.0000 mg | DELAYED_RELEASE_TABLET | Freq: Every day | ORAL | Status: DC

## 2016-04-05 MED ORDER — NICOTINE 21 MG/24HR TD PT24: 21.0000 mg | MEDICATED_PATCH | Freq: Every day | TRANSDERMAL | 0 refills | Status: DC

## 2016-04-05 MED ORDER — ATORVASTATIN CALCIUM 80 MG PO TABS: 80.0000 mg | ORAL_TABLET | Freq: Every day | ORAL | 11 refills | Status: DC

## 2016-04-05 MED ORDER — LISINOPRIL 10 MG PO TABS: 10.0000 mg | ORAL_TABLET | Freq: Every day | ORAL | 11 refills | Status: DC

## 2016-04-05 NOTE — Progress Notes (Signed)
Patient Name:  Donna Curry, Donna Curry: 1955-11-16, MRN: [id number] Primary Doctor: Nance Pear., NP Primary Cardiologist:   Date: 04/05/2016   SUBJECTIVE: The patient rested well last night. She is feeling better. Overall she is improved.   Past Medical History:  Diagnosis Date  . Hypertension   . Measles as a child  . Mumps as a child  . Tobacco abuse    Vitals:   04/04/16 1008 04/04/16 1200 04/04/16 2010 04/05/16 0425  BP: 125/86 135/77 120/75 (!) 143/85  Pulse: 71 64 72 94  Resp: '15  16 16  '$ Temp:  97.5 F (36.4 C) 98.1 F (36.7 C) 98.4 F (36.9 C)  TempSrc:  Oral Oral Oral  SpO2: 97% 100% 100% 100%  Weight:    71 lb 1.6 oz (32.3 kg)  Height:       No intake or output data in the 24 hours ending 04/05/16 6160 Filed Weights   04/03/16 0500 04/04/16 0400 04/05/16 0425  Weight: 69 lb 14.2 oz (31.7 kg) 72 lb 1.5 oz (32.7 kg) 71 lb 1.6 oz (32.3 kg)     LABS: Basic Metabolic Panel: No results for input(s): NA, K, CL, CO2, GLUCOSE, BUN, CREATININE, CALCIUM, MG, PHOS in the last 72 hours. Liver Function Tests: No results for input(s): AST, ALT, ALKPHOS, BILITOT, PROT, ALBUMIN in the last 72 hours. No results for input(s): LIPASE, AMYLASE in the last 72 hours. CBC: No results for input(s): WBC, NEUTROABS, HGB, HCT, MCV, PLT in the last 72 hours. Cardiac Enzymes:  Recent Labs  04/02/16 0920 04/02/16 1523  TROPONINI 0.07* 0.06*   BNP: Invalid input(s): POCBNP D-Dimer: No results for input(s): DDIMER in the last 72 hours. Thyroid Function Tests: No results for input(s): TSH, T4TOTAL, T3FREE, THYROIDAB in the last 72 hours.  Invalid input(s): FREET3  RADIOLOGY: Dg Abd Acute W/chest  Result Date: 04/01/2016 CLINICAL DATA:  Abdominal pain, vomiting EXAM: DG ABDOMEN ACUTE W/ 1V CHEST COMPARISON:  07/24/2015 FINDINGS: Cardiomediastinal silhouette is stable. Mild lower thoracic dextroscoliosis. No acute infiltrate or pleural effusion. No pulmonary edema.  There is levoscoliosis of lumbar spine. Normal small bowel gas pattern. No free abdominal air. Degenerative changes are noted lumbar spine. Post tubal ligation surgical clips are noted. IMPRESSION: No acute disease within chest. Normal small bowel gas pattern. Thoracolumbar scoliosis. No free abdominal air. Electronically Signed   By: Lahoma Crocker M.D.   On: 04/01/2016 18:10   Ct Angio Chest Aorta W And/or Wo Contrast  Addendum Date: 04/01/2016   ADDENDUM REPORT: 04/01/2016 23:41 ADDENDUM: Additional images performed following administration of additional 50 cc contrast for evaluation of the aorta. There is mild atherosclerotic calcification of the thoracic aorta. The ascending aorta measures approximately 3.5 cm in diameter. No aneurysmal dilatation or evidence of dissection. There is ectasia of the distal descending aorta measuring 2.8 cm in diameter. The origins of the great vessels of the aortic arch appear patent. There is intimal thickening of the origin of the left common carotid artery with minimal narrowing of the lumen of the vessel. Electronically Signed   By: Anner Crete M.D.   On: 04/01/2016 23:41   Result Date: 04/01/2016 CLINICAL DATA:  60 year old female with vomiting, and hypertension. EXAM: CT ANGIOGRAPHY CHEST WITH CONTRAST TECHNIQUE: Multidetector CT imaging of the chest was performed using the standard protocol during bolus administration of intravenous contrast. Multiplanar CT image reconstructions and MIPs were obtained to evaluate the vascular anatomy. CONTRAST:  100 cc Isovue 370 COMPARISON:  Chest radiograph dated 04/01/2016 FINDINGS: There is emphysematous changes of the lungs. No focal consolidation, pleural effusion, or pneumothorax. The lungs are hyperexpanded with flattening of the diaphragms. The central airways are patent. There is moderate atherosclerotic calcification of the thoracic aorta. There is no aneurysmal dilatation. Evaluation of the aorta is limited due to  suboptimal opacification and timing of the contrast. There is no CT evidence of pulmonary embolism. There is no cardiomegaly or pericardial effusion. Left ventricular hypertrophy. There is no hilar or mediastinal adenopathy. The esophagus is grossly unremarkable. No thyroid nodules identified. There is no axillary adenopathy. There is loss of subcutaneous soft tissue fat and cachexia. The osseous structures appear intact. Partially visualized low attenuating enlargement of the adrenal glands, likely underlying adenoma as seen on the CT of the abdomen pelvis. The visualized upper abdomen is otherwise unremarkable. Review of the MIP images confirms the above findings. IMPRESSION: No acute intrathoracic pathology. No CT evidence of pulmonary embolism. Emphysema. Cachexia. Electronically Signed: By: Anner Crete M.D. On: 04/01/2016 22:54   Ct Renal Stone Study  Result Date: 04/01/2016 CLINICAL DATA:  Right flank pain.  Nausea and hematuria EXAM: CT ABDOMEN AND PELVIS WITHOUT CONTRAST TECHNIQUE: Multidetector CT imaging of the abdomen and pelvis was performed following the standard protocol without IV contrast. COMPARISON:  02/24/2016 FINDINGS: Lower chest: No acute abnormality. Hepatobiliary: No focal liver abnormality is seen. No gallstones, gallbladder wall thickening, or biliary dilatation. Pancreas: Unremarkable. No pancreatic ductal dilatation or surrounding inflammatory changes. Spleen: Normal in size without focal abnormality. Adrenals/Urinary Tract: Low-attenuation enlargement of the adrenal glands again noted likely reflecting underlying adenomas. No kidney stones or hydronephrosis identified. The urinary bladder appears collapsed. No bladder calculi noted. Stomach/Bowel: Moderate distension of the gastric lumen. No pathologic dilatation of the large or small bowel loops. Vascular/Lymphatic: Aortic atherosclerosis is identified. No aneurysm. No evidence for abdominal or pelvic adenopathy. Reproductive:  Uterus and bilateral adnexa are unremarkable. Other: No abdominal wall hernia or abnormality. No abdominopelvic ascites. Musculoskeletal: There is a scoliosis deformity involving the lumbar spine which is convex towards the left. Multi level degenerative disc disease identified within the lumbar spine. No aggressive lytic or sclerotic bone lesions. IMPRESSION: 1. No kidney stones or evidence of hydronephrosis. 2. Aortic atherosclerosis. Electronically Signed   By: Kerby Moors M.D.   On: 04/01/2016 19:23    PHYSICAL EXAM  the patient is oriented to person time and place. Affect is normal.   TELEMETRY: I have reviewed telemetry today April 05, 2016. There is normal sinus rhythm.   ASSESSMENT AND PLAN:    TOBACCO ABUSE      Patient is requesting nicotine patches. We will help with this.    Hypertensive emergency      Blood pressure is stable on current meds. She is now stable and ready to go home today.  Dola Argyle 04/05/2016 6:32 AM

## 2016-04-05 NOTE — Discharge Summary (Signed)
Discharge Summary    Patient ID: Donna Curry,  MRN: [id number], DOB/AGE: 1956-04-22 60 y.o.  Admit date: 04/01/2016 Discharge date: 04/05/2016  Primary Care Provider: O'SULLIVAN,MELISSA S. Primary Cardiologist: New (Dr. Percival Spanish)  Discharge Diagnoses    Active Problems:   TOBACCO ABUSE   Essential hypertension   Adrenal mass ([place])   Hypertensive emergency   Hypokalemia   Protein-energy malnutrition ([facility])   Protein-calorie malnutrition, severe   Allergies No Known Allergies  Diagnostic Studies/Procedures    2D echo: 9/10  Study Conclusions  - Procedure narrative: Transthoracic echocardiography. Image   quality was suboptimal. The study was technically difficult, as a   result of poor acoustic windows. - Left ventricle: The cavity size was below normal. There was   moderate concentric hypertrophy. Systolic function was vigorous.   The estimated ejection fraction was in the range of 65% to 70%.   Wall motion was normal; there were no regional wall motion   abnormalities. Doppler parameters are consistent with abnormal   left ventricular relaxation (grade 1 diastolic dysfunction).   Doppler parameters are consistent with high ventricular filling   pressure. - Mitral valve: Mildly thickened leaflets . There was mild   regurgitation. - Right ventricle: Wall thickness was increased. Systolic function   was mildly reduced. - Atrial septum: No defect or patent foramen ovale was identified. - Tricuspid valve: There was mild regurgitation. _____________   History of Present Illness     60 y.o. AAF w/ history of adrenal incidentaloma, HTN (dx 2 yrs ago), tobacco abuse presented with intractable nausea and vomiting. She was not able to take her BP meds and developed systolic BP >478. These symptoms started on Friday night. She had associated abdominal cramps. She was unable to tolerate PO. She went to [facility] where her systolics were high so she was  started on NTG gtt and labetalol gtt requiring ICU care. She denied chest pain, dyspnea, PND, orthopnea, leg swelling, increased abdominal girth, syncope, presyncope, palpitations.. She also denied any fevers, chills, PE/DVT, TIA, CVA, amaurosis fugax otherwise. Her troponin was noted to be elevated in the setting of hypertensive emergency, with nonspecific EKG changes in the setting of LVH.   Hospital Course     Consultants: None  On arrival to [facility] her lisinopril/HCTZ was resumed, along with counseling for tobacco cessation and given nicotine patch. She was weaned from the nitroglycerin drip, and the following day her blood pressure was noted to be borderline soft so her lisinopril/HCTZ was reduced. She no longer complained of nausea/vomiting. BNP was noted to be slightly elevated and troponin was flat nondiagnostic trend. She did have one episode where her BP was 29'F systolic after her pressure medications were resumed. She was given 500 mL saline bolus, with improvement in blood pressure. She was also started on Lipitor '80mg'$  given her elevated LDL and cholesterol.   Her blood pressure remained with good control, and 2-D echo showed normal LV function and small cavity with concentric LVH. She was transferred to telemetry on 9/11 with plans to ambulate and possibly DC home following day.   She was seen and assessed on 9/12 by Dr. Ron Parker and determined stable for discharge home. Her current BP medications include lisinopril '10mg'$ /HCTZ 12.'5mg'$  daily. She has also requested to continue on nicotine patches once discharged home. I have arranged for 2 week follow up in the office.  _____________  Discharge Vitals Blood pressure (!) 143/85, pulse 94, temperature 98.4 F (36.9 C),  temperature source Oral, resp. rate 16, height '5\' 5"'$  (1.651 m), weight 71 lb 1.6 oz (32.3 kg), SpO2 100 %.  Filed Weights   04/03/16 0500 04/04/16 0400 04/05/16 0425  Weight: 69 lb 14.2 oz (31.7 kg) 72 lb 1.5 oz (32.7 kg) 71 lb  1.6 oz (32.3 kg)    Labs & Radiologic Studies    CBC No results for input(s): WBC, NEUTROABS, HGB, HCT, MCV, PLT in the last 72 hours. Basic Metabolic Panel No results for input(s): NA, K, CL, CO2, GLUCOSE, BUN, CREATININE, CALCIUM, MG, PHOS in the last 72 hours. Liver Function Tests No results for input(s): AST, ALT, ALKPHOS, BILITOT, PROT, ALBUMIN in the last 72 hours. No results for input(s): LIPASE, AMYLASE in the last 72 hours. Cardiac Enzymes  Recent Labs  04/02/16 1523  TROPONINI 0.06*   BNP Invalid input(s): POCBNP D-Dimer No results for input(s): DDIMER in the last 72 hours. Hemoglobin A1C No results for input(s): HGBA1C in the last 72 hours. Fasting Lipid Panel No results for input(s): CHOL, HDL, LDLCALC, TRIG, CHOLHDL, LDLDIRECT in the last 72 hours. Thyroid Function Tests No results for input(s): TSH, T4TOTAL, T3FREE, THYROIDAB in the last 72 hours.  Invalid input(s): FREET3 _____________  Darletta Moll Abd Acute W/chest  Result Date: 04/01/2016 CLINICAL DATA:  Abdominal pain, vomiting EXAM: DG ABDOMEN ACUTE W/ 1V CHEST COMPARISON:  07/24/2015 FINDINGS: Cardiomediastinal silhouette is stable. Mild lower thoracic dextroscoliosis. No acute infiltrate or pleural effusion. No pulmonary edema. There is levoscoliosis of lumbar spine. Normal small bowel gas pattern. No free abdominal air. Degenerative changes are noted lumbar spine. Post tubal ligation surgical clips are noted. IMPRESSION: No acute disease within chest. Normal small bowel gas pattern. Thoracolumbar scoliosis. No free abdominal air. Electronically Signed   By: Lahoma Crocker M.D.   On: 04/01/2016 18:10   Ct Angio Chest Aorta W And/or Wo Contrast  Addendum Date: 04/01/2016   ADDENDUM REPORT: 04/01/2016 23:41 ADDENDUM: Additional images performed following administration of additional 50 cc contrast for evaluation of the aorta. There is mild atherosclerotic calcification of the thoracic aorta. The ascending aorta measures  approximately 3.5 cm in diameter. No aneurysmal dilatation or evidence of dissection. There is ectasia of the distal descending aorta measuring 2.8 cm in diameter. The origins of the great vessels of the aortic arch appear patent. There is intimal thickening of the origin of the left common carotid artery with minimal narrowing of the lumen of the vessel. Electronically Signed   By: Anner Crete M.D.   On: 04/01/2016 23:41   Result Date: 04/01/2016 CLINICAL DATA:  60 year old female with vomiting, and hypertension. EXAM: CT ANGIOGRAPHY CHEST WITH CONTRAST TECHNIQUE: Multidetector CT imaging of the chest was performed using the standard protocol during bolus administration of intravenous contrast. Multiplanar CT image reconstructions and MIPs were obtained to evaluate the vascular anatomy. CONTRAST:  100 cc Isovue 370 COMPARISON:  Chest radiograph dated 04/01/2016 FINDINGS: There is emphysematous changes of the lungs. No focal consolidation, pleural effusion, or pneumothorax. The lungs are hyperexpanded with flattening of the diaphragms. The central airways are patent. There is moderate atherosclerotic calcification of the thoracic aorta. There is no aneurysmal dilatation. Evaluation of the aorta is limited due to suboptimal opacification and timing of the contrast. There is no CT evidence of pulmonary embolism. There is no cardiomegaly or pericardial effusion. Left ventricular hypertrophy. There is no hilar or mediastinal adenopathy. The esophagus is grossly unremarkable. No thyroid nodules identified. There is no axillary adenopathy. There is loss of subcutaneous  soft tissue fat and cachexia. The osseous structures appear intact. Partially visualized low attenuating enlargement of the adrenal glands, likely underlying adenoma as seen on the CT of the abdomen pelvis. The visualized upper abdomen is otherwise unremarkable. Review of the MIP images confirms the above findings. IMPRESSION: No acute intrathoracic  pathology. No CT evidence of pulmonary embolism. Emphysema. Cachexia. Electronically Signed: By: Anner Crete M.D. On: 04/01/2016 22:54   Ct Renal Stone Study  Result Date: 04/01/2016 CLINICAL DATA:  Right flank pain.  Nausea and hematuria EXAM: CT ABDOMEN AND PELVIS WITHOUT CONTRAST TECHNIQUE: Multidetector CT imaging of the abdomen and pelvis was performed following the standard protocol without IV contrast. COMPARISON:  02/24/2016 FINDINGS: Lower chest: No acute abnormality. Hepatobiliary: No focal liver abnormality is seen. No gallstones, gallbladder wall thickening, or biliary dilatation. Pancreas: Unremarkable. No pancreatic ductal dilatation or surrounding inflammatory changes. Spleen: Normal in size without focal abnormality. Adrenals/Urinary Tract: Low-attenuation enlargement of the adrenal glands again noted likely reflecting underlying adenomas. No kidney stones or hydronephrosis identified. The urinary bladder appears collapsed. No bladder calculi noted. Stomach/Bowel: Moderate distension of the gastric lumen. No pathologic dilatation of the large or small bowel loops. Vascular/Lymphatic: Aortic atherosclerosis is identified. No aneurysm. No evidence for abdominal or pelvic adenopathy. Reproductive: Uterus and bilateral adnexa are unremarkable. Other: No abdominal wall hernia or abnormality. No abdominopelvic ascites. Musculoskeletal: There is a scoliosis deformity involving the lumbar spine which is convex towards the left. Multi level degenerative disc disease identified within the lumbar spine. No aggressive lytic or sclerotic bone lesions. IMPRESSION: 1. No kidney stones or evidence of hydronephrosis. 2. Aortic atherosclerosis. Electronically Signed   By: Kerby Moors M.D.   On: 04/01/2016 19:23   Disposition   Pt is being discharged home today in good condition.  Follow-up Plans & Appointments    Follow-up Information    Kerin Ransom, [place] Follow up on 04/19/2016.   Specialties:   Cardiology, Radiology Why:  10:30 for your hospital follow up.  Contact information: [address] [place] [address] [phone]          Discharge Instructions    Call MD for:  difficulty breathing, headache or visual disturbances    Complete by:  As directed   Diet - low sodium heart healthy    Complete by:  As directed   Increase activity slowly    Complete by:  As directed      Discharge Medications   Current Discharge Medication List    START taking these medications   Details  aspirin EC 81 MG EC tablet Take 1 tablet (81 mg total) by mouth daily.    atorvastatin (LIPITOR) 80 MG tablet Take 1 tablet (80 mg total) by mouth daily at 6 PM. Qty: 30 tablet, Refills: 11    hydrochlorothiazide (MICROZIDE) 12.5 MG capsule Take 1 capsule (12.5 mg total) by mouth daily. Qty: 30 capsule, Refills: 11    lisinopril (PRINIVIL,ZESTRIL) 10 MG tablet Take 1 tablet (10 mg total) by mouth daily. Qty: 30 tablet, Refills: 11    nicotine (NICODERM CQ - DOSED IN MG/24 HOURS) 21 mg/24hr patch Place 1 patch (21 mg total) onto the skin daily. Qty: 28 patch, Refills: 0      CONTINUE these medications which have NOT CHANGED   Details  acetaminophen (TYLENOL) 500 MG tablet Take 1,000 mg by mouth every 8 (eight) hours as needed for moderate pain.      STOP taking these medications  lisinopril-hydrochlorothiazide (PRINZIDE,ZESTORETIC) 20-12.5 MG tablet      acyclovir (ZOVIRAX) 400 MG tablet      gabapentin (NEURONTIN) 300 MG capsule      HYDROcodone-acetaminophen (NORCO) 5-325 MG tablet      meloxicam (MOBIC) 7.5 MG tablet           Outstanding Labs/Studies   LFT and FLP in 4-6 weeks.   Duration of Discharge Encounter   Greater than 30 minutes including physician time.  Signed, Reino Bellis NP-C 04/05/2016, 9:34 AM Patient seen and examined. I agree with the assessment and plan as detailed above. See also my additional thoughts below.   Refer also  to my progress note for today. I made the decision for discharge. She is stable and ready to go home.  Dola Argyle, MD, [facility] 04/05/2016 11:18 AM

## 2016-04-06 ENCOUNTER — Telehealth: Payer: Self-pay | Admitting: *Deleted

## 2016-04-06 DIAGNOSIS — E279 Disorder of adrenal gland, unspecified: Secondary | ICD-10-CM

## 2016-04-06 LAB — METANEPHRINES, PLASMA
Metanephrine, Free: 94 pg/mL — ABNORMAL HIGH (ref 0–62)
Normetanephrine, Free: 407 pg/mL — ABNORMAL HIGH (ref 0–145)

## 2016-04-06 NOTE — Telephone Encounter (Signed)
Please let pt know when you speak to her that I reviewed some hormone testing that was done during her hospitalization and it showed some elevation of hormones that can sometimes cause high blood pressure.  I would like her to complete a CT scan to further evaluate her adrenal glands for possible growths that could cause this hormone elevation. ( I have pended the order, would you please sign off after you notify pt?) thanks.

## 2016-04-06 NOTE — Telephone Encounter (Signed)
Patient not available at time of TCM call. Left message and return # with pt's daughter. Daughter will have pt return call when daughter gets home from work.

## 2016-04-07 NOTE — Telephone Encounter (Signed)
Unable to reach patient at time of TCM Call. Left message for patient to return call when available.  

## 2016-04-08 NOTE — Telephone Encounter (Signed)
Will you please follow-up with this pt on Monday?

## 2016-04-08 NOTE — Telephone Encounter (Signed)
Unable to reach patient at time of TCM Call.  Left message for patient to return call when available.    Pt needs 30 min hospital follow-up appt w/ PCP on or before 04/19/16.

## 2016-04-11 NOTE — Telephone Encounter (Signed)
Attempted to reach patient for TCM/Hospital Follow-up call. Left message for patient to return call when available.    

## 2016-04-18 NOTE — Telephone Encounter (Signed)
Donna Curry, we have not been able to get in touch w/ this pt via phone to schedule HFU or place orders for CT. She only has one phone number on file and she is not on MyChart. Would you like to send a letter? Please advise.

## 2016-04-18 NOTE — Telephone Encounter (Signed)
Yes please

## 2016-04-18 NOTE — Telephone Encounter (Signed)
Unable to reach patient at time of TCM Call. Left message for patient to return call when available.  

## 2016-04-19 ENCOUNTER — Ambulatory Visit: Payer: Self-pay | Admitting: Cardiology

## 2016-04-19 ENCOUNTER — Encounter: Payer: Self-pay | Admitting: *Deleted

## 2016-04-19 NOTE — Telephone Encounter (Signed)
Letter mailed to pt.  

## 2016-04-29 ENCOUNTER — Ambulatory Visit: Payer: Self-pay | Admitting: Cardiology

## 2016-04-29 ENCOUNTER — Encounter: Payer: Self-pay | Admitting: *Deleted

## 2016-04-29 DIAGNOSIS — I119 Hypertensive heart disease without heart failure: Secondary | ICD-10-CM | POA: Insufficient documentation

## 2016-04-29 DIAGNOSIS — I43 Cardiomyopathy in diseases classified elsewhere: Secondary | ICD-10-CM

## 2016-05-06 ENCOUNTER — Ambulatory Visit (HOSPITAL_BASED_OUTPATIENT_CLINIC_OR_DEPARTMENT_OTHER)
Admission: RE | Admit: 2016-05-06 | Discharge: 2016-05-06 | Disposition: A | Discharge status: Home / Self Care | Payer: Self-pay | Source: Ambulatory Visit | Attending: Medical | Admitting: Medical

## 2016-05-06 ENCOUNTER — Encounter (HOSPITAL_BASED_OUTPATIENT_CLINIC_OR_DEPARTMENT_OTHER): Payer: Self-pay

## 2016-05-06 DIAGNOSIS — J432 Centrilobular emphysema: Secondary | ICD-10-CM | POA: Insufficient documentation

## 2016-05-06 DIAGNOSIS — J690 Pneumonitis due to inhalation of food and vomit: Secondary | ICD-10-CM

## 2016-05-06 DIAGNOSIS — I2510 Atherosclerotic heart disease of native coronary artery without angina pectoris: Secondary | ICD-10-CM | POA: Insufficient documentation

## 2016-05-06 DIAGNOSIS — R918 Other nonspecific abnormal finding of lung field: Secondary | ICD-10-CM | POA: Insufficient documentation

## 2016-05-06 DIAGNOSIS — I700 Atherosclerosis of aorta: Secondary | ICD-10-CM | POA: Insufficient documentation

## 2016-05-06 DIAGNOSIS — I517 Cardiomegaly: Secondary | ICD-10-CM | POA: Insufficient documentation

## 2016-05-06 DIAGNOSIS — F17200 Nicotine dependence, unspecified, uncomplicated: Secondary | ICD-10-CM | POA: Insufficient documentation

## 2016-05-06 MED ORDER — IOPAMIDOL (ISOVUE-300) INJECTION 61%
80.0000 mL | Freq: Once | INTRAVENOUS | Status: AC | PRN
  Administered 2016-05-06: 100 mL via INTRAVENOUS

## 2017-11-03 ENCOUNTER — Telehealth: Payer: Self-pay

## 2017-11-03 ENCOUNTER — Ambulatory Visit (HOSPITAL_BASED_OUTPATIENT_CLINIC_OR_DEPARTMENT_OTHER)
Admission: RE | Admit: 2017-11-03 | Discharge: 2017-11-03 | Disposition: A | Discharge status: Home / Self Care | Payer: Medicaid Other | Source: Ambulatory Visit | Attending: Medical | Admitting: Medical

## 2017-11-03 ENCOUNTER — Encounter: Payer: Self-pay | Admitting: Medical

## 2017-11-03 ENCOUNTER — Ambulatory Visit (INDEPENDENT_AMBULATORY_CARE_PROVIDER_SITE_OTHER): Payer: Medicaid Other | Admitting: Medical

## 2017-11-03 VITALS — BP 156/91 | HR 95 | Temp 98.6°F | Resp 16 | Ht 65.0 in | Wt 76.8 lb

## 2017-11-03 DIAGNOSIS — R634 Abnormal weight loss: Secondary | ICD-10-CM

## 2017-11-03 DIAGNOSIS — F17200 Nicotine dependence, unspecified, uncomplicated: Secondary | ICD-10-CM | POA: Diagnosis present

## 2017-11-03 DIAGNOSIS — R5383 Other fatigue: Secondary | ICD-10-CM

## 2017-11-03 DIAGNOSIS — R05 Cough: Secondary | ICD-10-CM | POA: Diagnosis present

## 2017-11-03 DIAGNOSIS — Z113 Encounter for screening for infections with a predominantly sexual mode of transmission: Secondary | ICD-10-CM | POA: Diagnosis not present

## 2017-11-03 DIAGNOSIS — I10 Essential (primary) hypertension: Secondary | ICD-10-CM

## 2017-11-03 DIAGNOSIS — M25552 Pain in left hip: Secondary | ICD-10-CM | POA: Diagnosis not present

## 2017-11-03 DIAGNOSIS — Z1211 Encounter for screening for malignant neoplasm of colon: Secondary | ICD-10-CM

## 2017-11-03 DIAGNOSIS — R059 Cough, unspecified: Secondary | ICD-10-CM

## 2017-11-03 DIAGNOSIS — Z1231 Encounter for screening mammogram for malignant neoplasm of breast: Secondary | ICD-10-CM | POA: Diagnosis not present

## 2017-11-03 DIAGNOSIS — R300 Dysuria: Secondary | ICD-10-CM

## 2017-11-03 LAB — CBC WITH DIFFERENTIAL/PLATELET
Basophils Absolute: 0 10*3/uL (ref 0.0–0.1)
Basophils Relative: 0.5 % (ref 0.0–3.0)
Eosinophils Absolute: 0.1 10*3/uL (ref 0.0–0.7)
Eosinophils Relative: 1.6 % (ref 0.0–5.0)
HCT: 29.9 % — ABNORMAL LOW (ref 36.0–46.0)
Hemoglobin: 9.3 g/dL — ABNORMAL LOW (ref 12.0–15.0)
Lymphocytes Relative: 20.5 % (ref 12.0–46.0)
Lymphs Abs: 1 10*3/uL (ref 0.7–4.0)
MCHC: 31.1 g/dL (ref 30.0–36.0)
MCV: 75.9 fl — ABNORMAL LOW (ref 78.0–100.0)
Monocytes Absolute: 0.6 10*3/uL (ref 0.1–1.0)
Monocytes Relative: 12.2 % — ABNORMAL HIGH (ref 3.0–12.0)
Neutro Abs: 3.1 10*3/uL (ref 1.4–7.7)
Neutrophils Relative %: 65.2 % (ref 43.0–77.0)
Platelets: 313 10*3/uL (ref 150.0–400.0)
RBC: 3.94 Mil/uL (ref 3.87–5.11)
RDW: 17.9 % — ABNORMAL HIGH (ref 11.5–15.5)
WBC: 4.7 10*3/uL (ref 4.0–10.5)

## 2017-11-03 LAB — COMPREHENSIVE METABOLIC PANEL
ALT: 10 U/L (ref 0–35)
AST: 15 U/L (ref 0–37)
Albumin: 4 g/dL (ref 3.5–5.2)
Alkaline Phosphatase: 86 U/L (ref 39–117)
BUN: 14 mg/dL (ref 6–23)
CO2: 31 mEq/L (ref 19–32)
Calcium: 9.4 mg/dL (ref 8.4–10.5)
Chloride: 103 mEq/L (ref 96–112)
Creatinine, Ser: 1 mg/dL (ref 0.40–1.20)
GFR: 72.3 mL/min (ref >60.00)
Glucose, Bld: 91 mg/dL (ref 70–99)
Potassium: 4.7 mEq/L (ref 3.5–5.1)
Sodium: 138 mEq/L (ref 135–145)
Total Bilirubin: 0.3 mg/dL (ref 0.2–1.2)
Total Protein: 7.7 g/dL (ref 6.0–8.3)

## 2017-11-03 LAB — POC URINALSYSI DIPSTICK (AUTOMATED)
Bilirubin, UA: NEGATIVE
Glucose, UA: NEGATIVE
Ketones, UA: 1.015
Nitrite, UA: NEGATIVE
Protein, UA: NEGATIVE
Spec Grav, UA: 1.015 (ref 1.010–1.025)
Urobilinogen, UA: NEGATIVE E.U./dL — AB
pH, UA: 6.5 (ref 5.0–8.0)

## 2017-11-03 LAB — TSH: TSH: 0.59 u[IU]/mL (ref 0.35–4.50)

## 2017-11-03 LAB — VITAMIN D 25 HYDROXY (VIT D DEFICIENCY, FRACTURES): VITD: 9.58 ng/mL — ABNORMAL LOW (ref 30.00–100.00)

## 2017-11-03 LAB — LIPASE: Lipase: 33 U/L (ref 11.0–59.0)

## 2017-11-03 LAB — AMYLASE: Amylase: 89 U/L (ref 27–131)

## 2017-11-03 LAB — VITAMIN B12: Vitamin B-12: 178 pg/mL — ABNORMAL LOW (ref 211–911)

## 2017-11-03 MED ORDER — DICLOFENAC SODIUM 75 MG PO TBEC: 75.0000 mg | DELAYED_RELEASE_TABLET | Freq: Two times a day (BID) | ORAL | 0 refills | Status: DC

## 2017-11-03 MED ORDER — DICLOFENAC POTASSIUM 50 MG PO TABS: 50.0000 mg | ORAL_TABLET | Freq: Two times a day (BID) | ORAL | 0 refills | Status: DC

## 2017-11-03 MED ORDER — PHENAZOPYRIDINE HCL 200 MG PO TABS: 200.0000 mg | ORAL_TABLET | Freq: Three times a day (TID) | ORAL | 0 refills | Status: DC | PRN

## 2017-11-03 MED ORDER — AMLODIPINE BESYLATE 10 MG PO TABS: 10.0000 mg | ORAL_TABLET | Freq: Every day | ORAL | 3 refills | Status: DC

## 2017-11-03 MED ORDER — CIPROFLOXACIN HCL 250 MG PO TABS: 250.0000 mg | ORAL_TABLET | Freq: Two times a day (BID) | ORAL | 0 refills | Status: DC

## 2017-11-03 NOTE — Progress Notes (Addendum)
Subjective:    Patient ID: Donna Curry, female    DOB: 01-Aug-1955, 62 y.o.   MRN: [id number]  HPI  Pt in for recent weight loss. But since 2017 her weight stated 72 lb. Current weight today was 76 lb.(states about 5 years ago weight was 90). Max weight her whole life was 110 lb. Pt states she has been checked for hiv in the past. Was negative. Pt never had colonosocopy. Mammogram was done in 2013. Negative mammogram in 2013. Pt does have history of smoking.    Pt did have ct of chest in 2017. No report of any nodules or mass seen.   Pt had ct of abdomen and pelvis in past. Her pcp comments in 2014. 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 No further ct's needed."   Pt also tells me she has history of kidney stone. Pt states occasional appearance of blood in urine about 3 weeks ago and intermittent. In 2017 renal stone study showed no kidney stone. But at [facility] 02-2016 did find stone rt side.   Pt tells me she only has pain when she urinates, No pain other wise. No recent cva area pain.   Pt bp is high. No cardiac or neurologic signs or symptoms. Pt on amlodipine 5 mg, lisinopril 10 mg and hctz 12. 5 mg.     Review of Systems  Constitutional: Positive for fatigue. Negative for chills and fever.  HENT: Negative for congestion, ear pain, sneezing and sore throat.   Cardiovascular: Negative for chest pain and palpitations.  Gastrointestinal: Negative for abdominal distention, abdominal pain, blood in stool and nausea.  Genitourinary: Positive for dysuria and frequency. Negative for difficulty urinating, flank pain, hematuria and urgency.  Musculoskeletal: Negative for arthralgias, back pain, gait problem and myalgias.       Some left hip pain. 4 months of pain.  Neurological: Negative for dizziness and headaches.  Hematological: Negative for adenopathy. Does not bruise/bleed easily.  Psychiatric/Behavioral: Negative for confusion.    Past Medical History:  Diagnosis Date  . Hypertension   . Measles as a child  . Mumps as a child  . Tobacco abuse      Social History   Socioeconomic History  . Marital status: Single    Spouse name: Not on file  . Number of children: Not on file  . Years of education: Not on file  . Highest education level: Not on file  Occupational History  . Not on file  Social Needs  . Financial resource strain: Not on file  . Food insecurity:    Worry: Not on file    Inability: Not on file  . Transportation needs:    Medical: Not on file    Non-medical: Not on file  Tobacco Use  . Smoking status: Current Some Day Smoker    Packs/day: 0.50    Types: Cigarettes    Last attempt to quit: 03/22/2013    Years since quitting: 4.6  . Smokeless tobacco: Never Used  . Tobacco comment: 1/4 pack per day,Hasnt smoked in 8 days  Substance and Sexual Activity  . Alcohol use: No    Alcohol/week: 0.0 oz  . Drug use: Yes    Comment: marijuana   . Sexual activity: Never  Lifestyle  . Physical activity:    Days per week: Not on file    Minutes per session: Not on file  . Stress: Not on file  Relationships  . Social  connections:    Talks on phone: Not on file    Gets together: Not on file    Attends religious service: Not on file    Active member of club or organization: Not on file    Attends meetings of clubs or organizations: Not on file    Relationship status: Not on file  . Intimate partner violence:    Fear of current or ex partner: Not on file    Emotionally abused: Not on file    Physically abused: Not on file    Forced sexual activity: Not on file  Other Topics Concern  . Not on file  Social History Narrative   Denies hx of drug use   Single   1 daughter age 39 lives with daughter and grandson who is 1.   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    Past Surgical History:  Procedure Laterality Date  . tubes tied    . uterine ablation      about 2005    Family History  Problem Relation Age of Onset  . Hypertension Mother   . Diabetes Father   . Hypertension Brother   . HIV Brother     No Known Allergies  Current Outpatient Medications on File Prior to Visit  Medication Sig Dispense Refill  . acetaminophen (TYLENOL) 500 MG tablet Take 1,000 mg by mouth every 8 (eight) hours as needed for moderate pain.    Marland Kitchen amLODipine (NORVASC) 5 MG tablet Take 5 mg by mouth daily.    Marland Kitchen aspirin EC 81 MG EC tablet Take 1 tablet (81 mg total) by mouth daily.    Marland Kitchen atorvastatin (LIPITOR) 80 MG tablet Take 1 tablet (80 mg total) by mouth daily at 6 PM. 30 tablet 11  . hydrochlorothiazide (MICROZIDE) 12.5 MG capsule Take 1 capsule (12.5 mg total) by mouth daily. 30 capsule 11  . lisinopril (PRINIVIL,ZESTRIL) 10 MG tablet Take 1 tablet (10 mg total) by mouth daily. 30 tablet 11  . nicotine (NICODERM CQ - DOSED IN MG/24 HOURS) 21 mg/24hr patch Place 1 patch (21 mg total) onto the skin daily. 28 patch 0   No current facility-administered medications on file prior to visit.     BP (!) 156/91   Pulse 95   Temp 98.6 F (37 C) (Oral)   Resp 16   Wt 76 lb 12.8 oz (34.8 kg)   SpO2 90%   BMI 12.78 kg/m       Objective:   Physical Exam  General Mental Status- Alert. General Appearance- Not in acute distress.   Skin General: Color- Normal Color. Moisture- Normal Moisture.  Neck Carotid Arteries- Normal color. Moisture- Normal Moisture. No carotid bruits. No JVD.  Chest and Lung Exam Auscultation: Breath Sounds:-Normal.  Cardiovascular Auscultation:Rythm- Regular. Murmurs & Other Heart Sounds:Auscultation of the heart reveals- No Murmurs.  Abdomen Inspection:-Inspeection Normal. Palpation/Percussion:Note:No mass. Palpation and Percussion of the abdomen reveal- Non Tender, Non Distended + BS, no rebound or guarding.    Neurologic Cranial Nerve exam:- CN III-XII intact(No nystagmus), symmetric smile. Strength:- 5/5 equal  and symmetric strength both upper and lower extremities.  Left hip- mild pain on palpation and range of motion.      Assessment & Plan:  For your history of described weight loss, I do think it is a good idea for you to get screening colonoscopy as you report never having one and I cannot find copy of the report in epic(our  computer system).  For fatigue and weight loss, I do think it is a good idea to get CBC, CMP, TSH, T4, amylase, lipase, B12, B1 and vitamin D.  Your blood pressure is elevated today and I do think it is a good idea for you to increase your amlodipine to 10 g daily.  For history of smoking and weight loss, I did place order to get chest x-ray today.  For recent left hip pain over the past 4 months, I did place order for a left hip x-ray.  For hip pain I am prescribing diclofenac.  For history of some frequent urination and reported blood in urine, we will get urine culture today and follow those results.  For urinary pain pending urine culture result, I will prescribe Pyridium.  Also will Curry ahead and prescribe Cipro 250 mg twice a day for 3 days pending the urine culture.  I do recommend that you follow-up in 1 week to repeat urine test and to see if any blood is persisting.  If blood does persist despite treatment for UTI then would refer you to urologist for cystoscopy.  Also placed screening mammogram as one year passed since last.  40 minutes spent with pt. 50% of time spent on reviewing work up and differential dx of her fatigue, weight loss, hip pain, hematuria and dysuria.    Mackie Pai, PA-C

## 2017-11-03 NOTE — Patient Instructions (Addendum)
For your history of described weight loss, I do think it is a good idea for you to get screening colonoscopy as you report never having one and I cannot find copy of the report in epic(our computer system).  For fatigue and weight loss, I do think it is a good idea to get CBC, CMP, TSH, T4, amylase, lipase, B12, B1 and vitamin D.  Your blood pressure is elevated today and I do think it is a good idea for you to increase your amlodipine to 10 g daily.  For history of smoking and weight loss, I did place order to get chest x-ray today.  For recent left hip pain over the past 4 months, I did place order for a left hip x-ray.  For hip pain I am prescribing diclofenac.  For history of some frequent urination and reported blood in urine, we will get urine culture today and follow those results.  For urinary pain pending urine culture result, I will prescribe Pyridium.  Also will go ahead and prescribe Cipro 250 mg twice a day for 3 days pending the urine culture.  I do recommend that you follow-up in 1 week to repeat urine test and to see if any blood is persisting.  If blood does persist despite treatment for UTI then would refer you to urologist for cystoscopy.

## 2017-11-03 NOTE — Telephone Encounter (Signed)
I saw the note regarding diclofenac 50 mg not covered under patient's insurance.  So I did send in 75 mg dose.  I do not remember ever getting denials on 75 mg.  Would you call patient on Monday and give her an update.  Pharmacy might give her a call as well when they get this new prescription.  Sending this note after hours on Friday before leaving the office.

## 2017-11-03 NOTE — Telephone Encounter (Signed)
Diclofenac 50mg  tablets not covered by Medicaid.

## 2017-11-04 ENCOUNTER — Telehealth: Payer: Self-pay | Admitting: Medical

## 2017-11-04 DIAGNOSIS — R739 Hyperglycemia, unspecified: Secondary | ICD-10-CM

## 2017-11-04 DIAGNOSIS — D649 Anemia, unspecified: Secondary | ICD-10-CM

## 2017-11-04 LAB — URINE CULTURE
MICRO NUMBER:: 90453568
SPECIMEN QUALITY:: ADEQUATE

## 2017-11-04 MED ORDER — VITAMIN D (ERGOCALCIFEROL) 1.25 MG (50000 UNIT) PO CAPS: 50000.0000 [IU] | ORAL_CAPSULE | ORAL | 0 refills | Status: DC

## 2017-11-04 NOTE — Telephone Encounter (Signed)
rx vit d sent to pharamcy.

## 2017-11-04 NOTE — Telephone Encounter (Signed)
Future labs placed. 

## 2017-11-06 ENCOUNTER — Telehealth: Payer: Self-pay | Admitting: Medical

## 2017-11-06 ENCOUNTER — Other Ambulatory Visit: Payer: Self-pay | Admitting: Emergency Medicine

## 2017-11-06 ENCOUNTER — Other Ambulatory Visit (INDEPENDENT_AMBULATORY_CARE_PROVIDER_SITE_OTHER): Payer: Medicaid Other

## 2017-11-06 DIAGNOSIS — R739 Hyperglycemia, unspecified: Secondary | ICD-10-CM | POA: Diagnosis not present

## 2017-11-06 DIAGNOSIS — D508 Other iron deficiency anemias: Secondary | ICD-10-CM

## 2017-11-06 DIAGNOSIS — D619 Aplastic anemia, unspecified: Secondary | ICD-10-CM

## 2017-11-06 LAB — FERRITIN

## 2017-11-06 LAB — IRON, TOTAL/TOTAL IRON BINDING CAP
%SAT: 3 % (calc) — ABNORMAL LOW (ref 11–50)
Iron: 12 ug/dL — ABNORMAL LOW (ref 45–160)
TIBC: 442 mcg/dL (calc) (ref 250–450)

## 2017-11-06 LAB — T4: T4, Total: 9.5 ug/dL (ref 5.1–11.9)

## 2017-11-06 LAB — VITAMIN B1: Vitamin B1 (Thiamine): <6 nmol/L — ABNORMAL LOW (ref 8–30)

## 2017-11-06 LAB — HEMOGLOBIN A1C: Hgb A1c MFr Bld: 6.1 % (ref 4.6–6.5)

## 2017-11-06 LAB — HIV ANTIBODY (ROUTINE TESTING W REFLEX): HIV 1&2 Ab, 4th Generation: NONREACTIVE

## 2017-11-06 MED ORDER — FERROUS SULFATE 325 (65 FE) MG PO TABS: 325.0000 mg | ORAL_TABLET | Freq: Two times a day (BID) | ORAL | 3 refills | Status: DC

## 2017-11-06 NOTE — Telephone Encounter (Signed)
Rx iron sent to pt pharmacy.

## 2017-11-06 NOTE — Addendum Note (Signed)
Addended by: Trenda Moots on: 8/40/3754 10:00 AM   Modules accepted: Orders

## 2017-11-09 ENCOUNTER — Ambulatory Visit (INDEPENDENT_AMBULATORY_CARE_PROVIDER_SITE_OTHER): Payer: Medicaid Other | Admitting: Medical

## 2017-11-09 ENCOUNTER — Encounter: Payer: Self-pay | Admitting: Medical

## 2017-11-09 VITALS — BP 114/76 | HR 105 | Temp 98.2°F | Resp 16 | Ht 65.0 in | Wt 77.2 lb

## 2017-11-09 DIAGNOSIS — R7989 Other specified abnormal findings of blood chemistry: Secondary | ICD-10-CM | POA: Diagnosis not present

## 2017-11-09 DIAGNOSIS — R634 Abnormal weight loss: Secondary | ICD-10-CM

## 2017-11-09 DIAGNOSIS — E611 Iron deficiency: Secondary | ICD-10-CM | POA: Diagnosis not present

## 2017-11-09 DIAGNOSIS — R5383 Other fatigue: Secondary | ICD-10-CM

## 2017-11-09 DIAGNOSIS — D649 Anemia, unspecified: Secondary | ICD-10-CM

## 2017-11-09 LAB — CBC WITH DIFFERENTIAL/PLATELET
Basophils Absolute: 0 10*3/uL (ref 0.0–0.1)
Basophils Relative: 0.6 % (ref 0.0–3.0)
Eosinophils Absolute: 0.1 10*3/uL (ref 0.0–0.7)
Eosinophils Relative: 3.1 % (ref 0.0–5.0)
HCT: 27.1 % — ABNORMAL LOW (ref 36.0–46.0)
Hemoglobin: 8.5 g/dL — ABNORMAL LOW (ref 12.0–15.0)
Lymphocytes Relative: 17.8 % (ref 12.0–46.0)
Lymphs Abs: 0.8 10*3/uL (ref 0.7–4.0)
MCHC: 31.4 g/dL (ref 30.0–36.0)
MCV: 74.8 fl — ABNORMAL LOW (ref 78.0–100.0)
Monocytes Absolute: 0.6 10*3/uL (ref 0.1–1.0)
Monocytes Relative: 12.6 % — ABNORMAL HIGH (ref 3.0–12.0)
Neutro Abs: 3.1 10*3/uL (ref 1.4–7.7)
Neutrophils Relative %: 65.9 % (ref 43.0–77.0)
Platelets: 316 10*3/uL (ref 150.0–400.0)
RBC: 3.62 Mil/uL — ABNORMAL LOW (ref 3.87–5.11)
RDW: 17.1 % — ABNORMAL HIGH (ref 11.5–15.5)
WBC: 4.8 10*3/uL (ref 4.0–10.5)

## 2017-11-09 NOTE — Patient Instructions (Addendum)
For your fatigue recommend taking the iron, vitamin d tab and b1 otc vitamin.  Please turn in the ifob test as soon as possible.  Referral to GI already placed.  Can go downstairs and schedule mammogram. Order placed.  Urine symptoms cleared now after antibiotics  Please get cbc today. Want to make sure anemia level stable.  Follow up in 3 weeks or as needed

## 2017-11-09 NOTE — Progress Notes (Signed)
Subjective:    Patient ID: Donna Curry, female    DOB: 07/07/1956, 62 y.o.   MRN: [id number]  HPI  Pt is in for follow up from last visit.  Pt is anemic and I notified her today. She will get ifob test today and I instructed her to turn those in. GI appointment is pending.  Her vitamin D was low.I sent vitamin D to her pharmacy. Notified her today of lab result. Her vitamin b1 was low.   Iron was low and I sent in ferrous sulfate. Notified her today.  Pt has low bacteria count on culture. I rx'd antibiotic. She states symptoms cleared with antibiotic.    Review of Systems  Constitutional: Positive for fatigue. Negative for chills and fever.       Mild improved energy.  HENT: Negative for congestion, drooling, ear discharge and ear pain.   Respiratory: Negative for cough, chest tightness, shortness of breath and wheezing.   Cardiovascular: Negative for chest pain and palpitations.  Gastrointestinal: Negative for abdominal distention, abdominal pain, blood in stool, nausea and vomiting.  Genitourinary: Negative for difficulty urinating, dysuria, frequency, genital sores and urgency.  Musculoskeletal: Negative for back pain.  Skin: Negative for rash.  Neurological: Negative for dizziness, syncope, speech difficulty, weakness, numbness and headaches.  Hematological: Negative for adenopathy. Does not bruise/bleed easily.  Psychiatric/Behavioral: Negative for behavioral problems, confusion, self-injury and sleep disturbance. The patient is not nervous/anxious.    Past Medical History:  Diagnosis Date  . Hypertension   . Measles as a child  . Mumps as a child  . Tobacco abuse      Social History   Socioeconomic History  . Marital status: Single    Spouse name: Not on file  . Number of children: Not on file  . Years of education: Not on file  . Highest education level: Not on file  Occupational History  . Not on file  Social Needs  . Financial resource strain: Not on  file  . Food insecurity:    Worry: Not on file    Inability: Not on file  . Transportation needs:    Medical: Not on file    Non-medical: Not on file  Tobacco Use  . Smoking status: Current Some Day Smoker    Packs/day: 0.50    Types: Cigarettes    Last attempt to quit: 03/22/2013    Years since quitting: 4.6  . Smokeless tobacco: Never Used  . Tobacco comment: 1/4 pack per day,Hasnt smoked in 8 days  Substance and Sexual Activity  . Alcohol use: No    Alcohol/week: 0.0 oz  . Drug use: Yes    Comment: marijuana   . Sexual activity: Never  Lifestyle  . Physical activity:    Days per week: Not on file    Minutes per session: Not on file  . Stress: Not on file  Relationships  . Social connections:    Talks on phone: Not on file    Gets together: Not on file    Attends religious service: Not on file    Active member of club or organization: Not on file    Attends meetings of clubs or organizations: Not on file    Relationship status: Not on file  . Intimate partner violence:    Fear of current or ex partner: Not on file    Emotionally abused: Not on file    Physically abused: Not on file    Forced sexual activity: Not  on file  Other Topics Concern  . Not on file  Social History Narrative   Denies hx of drug use   Single   1 daughter age 24 lives with daughter and grandson who is 55.   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    Past Surgical History:  Procedure Laterality Date  . tubes tied    . uterine ablation     about 2005    Family History  Problem Relation Age of Onset  . Hypertension Mother   . Diabetes Father   . Hypertension Brother   . HIV Brother     No Known Allergies  Current Outpatient Medications on File Prior to Visit  Medication Sig Dispense Refill  . acetaminophen (TYLENOL) 500 MG tablet Take 1,000 mg by mouth every 8 (eight) hours as needed for moderate pain.    Marland Kitchen amLODipine (NORVASC) 10 MG tablet Take 1  tablet (10 mg total) by mouth daily. 90 tablet 3  . amLODipine (NORVASC) 5 MG tablet Take 5 mg by mouth daily.    Marland Kitchen aspirin EC 81 MG EC tablet Take 1 tablet (81 mg total) by mouth daily.    Marland Kitchen atorvastatin (LIPITOR) 80 MG tablet Take 1 tablet (80 mg total) by mouth daily at 6 PM. 30 tablet 11  . ciprofloxacin (CIPRO) 250 MG tablet Take 1 tablet (250 mg total) by mouth 2 (two) times daily. 6 tablet 0  . diclofenac (VOLTAREN) 75 MG EC tablet Take 1 tablet (75 mg total) by mouth 2 (two) times daily. 14 tablet 0  . ferrous sulfate 325 (65 FE) MG tablet Take 1 tablet (325 mg total) by mouth 2 (two) times daily with a meal. 60 tablet 3  . hydrochlorothiazide (MICROZIDE) 12.5 MG capsule Take 1 capsule (12.5 mg total) by mouth daily. 30 capsule 11  . lisinopril (PRINIVIL,ZESTRIL) 10 MG tablet Take 1 tablet (10 mg total) by mouth daily. 30 tablet 11  . nicotine (NICODERM CQ - DOSED IN MG/24 HOURS) 21 mg/24hr patch Place 1 patch (21 mg total) onto the skin daily. 28 patch 0  . phenazopyridine (PYRIDIUM) 200 MG tablet Take 1 tablet (200 mg total) by mouth 3 (three) times daily as needed for pain. 10 tablet 0  . Vitamin D, Ergocalciferol, (DRISDOL) 50000 units CAPS capsule Take 1 capsule (50,000 Units total) by mouth every 7 (seven) days. 8 capsule 0   No current facility-administered medications on file prior to visit.     BP 114/76   Pulse (!) 105   Temp 98.2 F (36.8 C) (Oral)   Resp 16   Ht 5\' 5"  (1.651 m)   Wt 77 lb 3.2 oz (35 kg)   SpO2 96%   BMI 12.85 kg/m       Objective:   Physical Exam  General Appearance- Not in acute distress.  HEENT Eyes- Scleraeral/Conjuntiva-bilat- Not Yellow. Mouth & Throat- Normal.  Chest and Lung Exam Auscultation: Breath sounds:-Normal. Adventitious sounds:- No Adventitious sounds.  Cardiovascular Auscultation:Rythm - Regular. Heart Sounds -Normal heart sounds.  Abdomen Inspection:-Inspection Normal.  Palpation/Perucssion: Palpation and  Percussion of the abdomen reveal- Non Tender, No Rebound tenderness, No rigidity(Guarding) and No Palpable abdominal masses.  Liver:-Normal.  Spleen:- Normal.   Back- no cva tenderness.      Assessment & Plan:  For your fatigue recommend taking the iron, vitamin d tab and b1 otc vitamin.  Please turn in the ifob test as soon as possible.  Referral  to GI already placed.  Can Curry downstairs and schedule mammogram. Order placed.  Urine symptoms cleared now after antibiotics  Please get cbc today. Want to make sure anemia level stable.  Follow up in 3 weeks or as needed  [employer], [employer]

## 2017-11-12 ENCOUNTER — Other Ambulatory Visit: Payer: Self-pay

## 2017-11-12 ENCOUNTER — Emergency Department (HOSPITAL_BASED_OUTPATIENT_CLINIC_OR_DEPARTMENT_OTHER)
Admission: EM | Admit: 2017-11-12 | Discharge: 2017-11-12 | Disposition: A | Discharge status: Home / Self Care | Payer: Medicaid Other | Attending: Emergency Medicine | Admitting: Emergency Medicine

## 2017-11-12 ENCOUNTER — Emergency Department (HOSPITAL_BASED_OUTPATIENT_CLINIC_OR_DEPARTMENT_OTHER): Payer: Medicaid Other

## 2017-11-12 ENCOUNTER — Telehealth: Payer: Self-pay | Admitting: Medical

## 2017-11-12 ENCOUNTER — Encounter (HOSPITAL_BASED_OUTPATIENT_CLINIC_OR_DEPARTMENT_OTHER): Payer: Self-pay | Admitting: Emergency Medicine

## 2017-11-12 DIAGNOSIS — E860 Dehydration: Secondary | ICD-10-CM | POA: Diagnosis not present

## 2017-11-12 DIAGNOSIS — N3289 Other specified disorders of bladder: Secondary | ICD-10-CM | POA: Diagnosis not present

## 2017-11-12 DIAGNOSIS — I10 Essential (primary) hypertension: Secondary | ICD-10-CM | POA: Diagnosis not present

## 2017-11-12 DIAGNOSIS — F17210 Nicotine dependence, cigarettes, uncomplicated: Secondary | ICD-10-CM | POA: Diagnosis not present

## 2017-11-12 DIAGNOSIS — R319 Hematuria, unspecified: Secondary | ICD-10-CM

## 2017-11-12 DIAGNOSIS — I429 Cardiomyopathy, unspecified: Secondary | ICD-10-CM | POA: Diagnosis not present

## 2017-11-12 DIAGNOSIS — Z79899 Other long term (current) drug therapy: Secondary | ICD-10-CM | POA: Insufficient documentation

## 2017-11-12 DIAGNOSIS — I951 Orthostatic hypotension: Secondary | ICD-10-CM | POA: Insufficient documentation

## 2017-11-12 DIAGNOSIS — Z7982 Long term (current) use of aspirin: Secondary | ICD-10-CM | POA: Diagnosis not present

## 2017-11-12 DIAGNOSIS — R531 Weakness: Secondary | ICD-10-CM | POA: Diagnosis present

## 2017-11-12 LAB — URINALYSIS, ROUTINE W REFLEX MICROSCOPIC
Bilirubin Urine: NEGATIVE
Glucose, UA: NEGATIVE mg/dL
Ketones, ur: NEGATIVE mg/dL
Nitrite: NEGATIVE
Protein, ur: 30 mg/dL — AB
Specific Gravity, Urine: 1.01 (ref 1.005–1.030)
pH: 7 (ref 5.0–8.0)

## 2017-11-12 LAB — HEPATIC FUNCTION PANEL
ALT: 37 U/L (ref 14–54)
AST: 49 U/L — ABNORMAL HIGH (ref 15–41)
Albumin: 4.3 g/dL (ref 3.5–5.0)
Alkaline Phosphatase: 90 U/L (ref 38–126)
Bilirubin, Direct: 0.1 mg/dL (ref 0.1–0.5)
Indirect Bilirubin: 0.6 mg/dL (ref 0.3–0.9)
Total Bilirubin: 0.7 mg/dL (ref 0.3–1.2)
Total Protein: 8.3 g/dL — ABNORMAL HIGH (ref 6.5–8.1)

## 2017-11-12 LAB — CBC
HCT: 31.9 % — ABNORMAL LOW (ref 36.0–46.0)
Hemoglobin: 10.3 g/dL — ABNORMAL LOW (ref 12.0–15.0)
MCH: 23.6 pg — ABNORMAL LOW (ref 26.0–34.0)
MCHC: 32.3 g/dL (ref 30.0–36.0)
MCV: 73 fL — ABNORMAL LOW (ref 78.0–100.0)
Platelets: 349 10*3/uL (ref 150–400)
RBC: 4.37 MIL/uL (ref 3.87–5.11)
RDW: 17.7 % — ABNORMAL HIGH (ref 11.5–15.5)
WBC: 6.6 10*3/uL (ref 4.0–10.5)

## 2017-11-12 LAB — BASIC METABOLIC PANEL
Anion gap: 14 (ref 5–15)
BUN: 32 mg/dL — ABNORMAL HIGH (ref 6–20)
CO2: 24 mmol/L (ref 22–32)
Calcium: 9.9 mg/dL (ref 8.9–10.3)
Chloride: 96 mmol/L — ABNORMAL LOW (ref 101–111)
Creatinine, Ser: 1.34 mg/dL — ABNORMAL HIGH (ref 0.44–1.00)
GFR calc Af Amer: 48 mL/min — ABNORMAL LOW (ref >60)
GFR calc non Af Amer: 42 mL/min — ABNORMAL LOW (ref >60)
Glucose, Bld: 115 mg/dL — ABNORMAL HIGH (ref 65–99)
Potassium: 4 mmol/L (ref 3.5–5.1)
Sodium: 134 mmol/L — ABNORMAL LOW (ref 135–145)

## 2017-11-12 LAB — URINALYSIS, MICROSCOPIC (REFLEX)

## 2017-11-12 LAB — LIPASE, BLOOD: Lipase: 46 U/L (ref 11–51)

## 2017-11-12 LAB — TROPONIN I
Troponin I: <0.03 ng/mL (ref <0.03)
Troponin I: <0.03 ng/mL (ref <0.03)

## 2017-11-12 MED ORDER — LACTATED RINGERS IV BOLUS
1000.0000 mL | Freq: Once | INTRAVENOUS | Status: AC
  Administered 2017-11-12: 1000 mL via INTRAVENOUS

## 2017-11-12 MED ORDER — SODIUM CHLORIDE 0.9 % IV BOLUS
1000.0000 mL | Freq: Once | INTRAVENOUS | Status: AC
  Administered 2017-11-12: 1000 mL via INTRAVENOUS

## 2017-11-12 NOTE — Telephone Encounter (Signed)
Will you go ahead and refer pt urologist. I also put in referral to oncologist. Before you get process moving for patient on oncologist referral  want to talk with her first. I'll try to let you know Monday afternoon or Tuesday morning.

## 2017-11-12 NOTE — ED Notes (Signed)
ED Provider at bedside. 

## 2017-11-12 NOTE — ED Triage Notes (Addendum)
Patient states that she just went to her MD upstairs on Thursday and they just started on new medications and changes her BP medications - the patient states that she is having generalized weakness since yesterday  - patient states that she is drinking water but is not eating because she has had no appetite. She reports that she was dizzy when she stood up this am

## 2017-11-12 NOTE — ED Notes (Signed)
Patient transported to CT 

## 2017-11-12 NOTE — Telephone Encounter (Signed)
Melissa,  I saw pt while you were out. I think I sent you that note see on April 12,2019. Began work up for weight loss. She went to ED this weekend for severe hypotension. She is anemic and had a lot of blood in urine in ED. 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 I went ahead and put in referral to urologist and oncologist. I will try to swing by your work area and touch base with you on this.  Thanks, Percell Miller

## 2017-11-12 NOTE — Discharge Instructions (Addendum)
Follow up with your primary care provider to discuss your blood pressure medicine. STOP TAKING AMLODIPINE UNTIL YOU FOLLOW UP. Take all other medications. Follow up with UROLOGY for further work up of your bladder mass. Drink plenty of fluids. Return to ER if you have worsening lightheadedness, abdominal pain, bleeding, fever, or passing out.

## 2017-11-12 NOTE — ED Provider Notes (Signed)
[facility] EMERGENCY DEPARTMENT Provider Note   CSN: [id number] Arrival date & time: 11/12/17  0093     History   Chief Complaint Chief Complaint  Patient presents with  . Weakness    HPI Donna Curry is a 62 y.o. female.  62 year old female with past medical history including hypertension, hypertensive cardiomyopathy who presents with weakness.  The patient states that she has had generalized weakness since yesterday.  She reports decreased appetite, not wanting to eat anything for the past 2 days.  She has had some dizziness with standing that she describes as a lightheadedness.  She denies any chest pain, shortness of breath, cough/cold symptoms, fevers, or urinary symptoms.  She endorses constipation.  No bloody or black stools.  No abdominal pain.  She was recently started on amlodipine for blood pressure.  She had nausea earlier but denies any currently and denies any vomiting.  Of note, she was recently evaluated at PCP office for weight loss and fatigue. Checked labs and was noted to have iron deficiency anemia. She was started on iron and Vitamin D and referred for outpatient screening colonoscopy.   The history is provided by the patient.  Weakness     Past Medical History:  Diagnosis Date  . Hypertension   . Measles as a child  . Mumps as a child  . Tobacco abuse     Patient Active Problem List   Diagnosis Date Noted  . Hypertensive cardiomyopathy, 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[place]) 04/29/2016  . Hypokalemia 04/02/2016  . Protein-energy malnutrition ([place]) 04/02/2016  . Hypertensive emergency 04/01/2016  . Dizziness and giddiness 04/12/2013  . Palpitations 03/27/2013  . Weight loss 06/08/2012  . Degenerative disc disease, lumbar 01/30/2012  . Adrenal mass ([place]) 01/30/2012  . Hypercalcemia 01/30/2012  . Hyperproteinemia 01/30/2012  . Borderline diabetes 01/30/2012  . Normocytic anemia 03/11/2011  . TOBACCO ABUSE 08/31/2009  . Essential hypertension  08/31/2009    Past Surgical History:  Procedure Laterality Date  . tubes tied    . uterine ablation     about 2005     OB History   None      Home Medications    Prior to Admission medications   Medication Sig Start Date End Date Taking? Authorizing Provider  acetaminophen (TYLENOL) 500 MG tablet Take 1,000 mg by mouth every 8 (eight) hours as needed for moderate pain.    [provider]  aspirin EC 81 MG EC tablet Take 1 tablet (81 mg total) by mouth daily. 04/05/16   Cheryln Manly, NP  atorvastatin (LIPITOR) 80 MG tablet Take 1 tablet (80 mg total) by mouth daily at 6 PM. 04/05/16   Cheryln Manly, NP  ciprofloxacin (CIPRO) 250 MG tablet Take 1 tablet (250 mg total) by mouth 2 (two) times daily. 11/03/17   Saguier, Percell Miller, PA-C  diclofenac (VOLTAREN) 75 MG EC tablet Take 1 tablet (75 mg total) by mouth 2 (two) times daily. 11/03/17   Saguier, Percell Miller, PA-C  ferrous sulfate 325 (65 FE) MG tablet Take 1 tablet (325 mg total) by mouth 2 (two) times daily with a meal. 11/06/17   Saguier, Percell Miller, PA-C  hydrochlorothiazide (MICROZIDE) 12.5 MG capsule Take 1 capsule (12.5 mg total) by mouth daily. 04/05/16   Cheryln Manly, NP  lisinopril (PRINIVIL,ZESTRIL) 10 MG tablet Take 1 tablet (10 mg total) by mouth daily. 04/05/16   Cheryln Manly, NP  nicotine (NICODERM CQ - DOSED IN MG/24 HOURS) 21 mg/24hr patch Place 1  patch (21 mg total) onto the skin daily. 04/05/16   Cheryln Manly, NP  phenazopyridine (PYRIDIUM) 200 MG tablet Take 1 tablet (200 mg total) by mouth 3 (three) times daily as needed for pain. 11/03/17   Saguier, Percell Miller, PA-C  Vitamin D, Ergocalciferol, (DRISDOL) 50000 units CAPS capsule Take 1 capsule (50,000 Units total) by mouth every 7 (seven) days. 11/04/17   Saguier, Percell Miller, PA-C    Family History Family History  Problem Relation Age of Onset  . Hypertension Mother   . Diabetes Father   . Hypertension Brother   . HIV Brother     Social  History Social History   Tobacco Use  . Smoking status: Current Some Day Smoker    Packs/day: 0.50    Types: Cigarettes    Last attempt to quit: 03/22/2013    Years since quitting: 4.6  . Smokeless tobacco: Never Used  . Tobacco comment: 1/4 pack per day,Hasnt smoked in 8 days  Substance Use Topics  . Alcohol use: No    Alcohol/week: 0.0 oz  . Drug use: Yes    Comment: marijuana      Allergies   Patient has no known allergies.   Review of Systems Review of Systems  Neurological: Positive for weakness.   All other systems reviewed and are negative except that which was mentioned in HPI   Physical Exam Updated Vital Signs BP (!) 144/90   Pulse 96   Temp 98 F (36.7 C) (Oral)   Resp 16   Ht 5\' 5"  (1.651 m)   Wt 32.7 kg (72 lb)   SpO2 94%   BMI 11.98 kg/m   Physical Exam  Constitutional: She is oriented to person, place, and time. She appears well-developed. No distress.  Thin, frail woman sitting in bed, comfortable  HENT:  Head: Normocephalic and atraumatic.  Moist mucous membranes  Eyes: Conjunctivae are normal.  Neck: Neck supple.  Cardiovascular: Normal rate, regular rhythm and normal heart sounds.  No murmur heard. Pulmonary/Chest: Effort normal and breath sounds normal.  Abdominal: Soft. Bowel sounds are normal. She exhibits no distension. There is no tenderness.  Musculoskeletal: She exhibits no edema.  Neurological: She is alert and oriented to person, place, and time.  Fluent speech  Skin: Skin is warm and dry.  Psychiatric: She has a normal mood and affect. Judgment normal.  Nursing note and vitals reviewed.    ED Treatments / Results  Labs (all labs ordered are listed, but only abnormal results are displayed) Labs Reviewed  BASIC METABOLIC PANEL - Abnormal; Notable for the following components:      Result Value   Sodium 134 (*)    Chloride 96 (*)    Glucose, Bld 115 (*)    BUN 32 (*)    Creatinine, Ser 1.34 (*)    GFR calc non Af Amer  42 (*)    GFR calc Af Amer 48 (*)    All other components within normal limits  CBC - Abnormal; Notable for the following components:   Hemoglobin 10.3 (*)    HCT 31.9 (*)    MCV 73.0 (*)    MCH 23.6 (*)    RDW 17.7 (*)    All other components within normal limits  URINALYSIS, ROUTINE W REFLEX MICROSCOPIC - Abnormal; Notable for the following components:   APPearance HAZY (*)    Hgb urine dipstick LARGE (*)    Protein, ur 30 (*)    Leukocytes, UA SMALL (*)  All other components within normal limits  HEPATIC FUNCTION PANEL - Abnormal; Notable for the following components:   Total Protein 8.3 (*)    AST 49 (*)    All other components within normal limits  URINALYSIS, MICROSCOPIC (REFLEX) - Abnormal; Notable for the following components:   Bacteria, UA MANY (*)    Squamous Epithelial / LPF 6-30 (*)    Non Squamous Epithelial PRESENT (*)    All other components within normal limits  URINE CULTURE  LIPASE, BLOOD  TROPONIN I  TROPONIN I    EKG EKG Interpretation  Date/Time:  Sunday November 12 2017 11:18:48 EDT Ventricular Rate:  102 PR Interval:    QRS Duration: 68 QT Interval:  343 QTC Calculation: 447 R Axis:   88 Text Interpretation:  Sinus tachycardia Right atrial enlargement Borderline right axis deviation LVH with secondary repolarization abnormality ST depr, consider ischemia, inferior leads diffuse T wave inversions and ST depression compared to previous Confirmed by Theotis Burrow [phone]) on 11/12/2017 12:42:51 PM   Radiology Ct Chest Wo Contrast  Result Date: 11/12/2017 CLINICAL DATA:  30 pound weight loss over the past year in a smoker. Nausea and vomiting for 2 days. Back pain. EXAM: CT CHEST, ABDOMEN AND PELVIS WITHOUT CONTRAST TECHNIQUE: Multidetector CT imaging of the chest, abdomen and pelvis was performed following the standard protocol without IV contrast. COMPARISON:  CT chest 05/06/2016. FINDINGS: CT CHEST FINDINGS Cardiovascular: There is calcific aortic  and coronary atherosclerosis. No pericardial effusion. No aneurysm. Mediastinum/Nodes: No enlarged mediastinal, hilar, or axillary lymph nodes. Thyroid gland, trachea, and esophagus demonstrate no significant findings. Lungs/Pleura: No pleural effusion. The lungs demonstrate extensive emphysematous disease but are clear. Airways are unremarkable. Musculoskeletal: No acute or focal abnormality. CT ABDOMEN PELVIS FINDINGS Hepatobiliary: No focal liver abnormality is seen. No gallstones, gallbladder wall thickening, or biliary dilatation. Pancreas: Unremarkable. No pancreatic ductal dilatation or surrounding inflammatory changes. Spleen: Normal in size without focal abnormality. Adrenals/Urinary Tract: There is a lobulated mass in the right side of the urinary bladder measuring 5.5 cm AP by 5.8 cm transverse by 4.3 cm craniocaudal. A single calcification is seen within the lesion. Mild bilateral hydronephrosis is noted. No urinary tract stones are identified. The adrenal glands are difficult to visualize but there appears to be thickening and hypoattenuation of right adrenal gland most consistent with an adenoma. Stomach/Bowel: Stomach is within normal limits. Appendix appears normal. No evidence of bowel wall thickening, distention, or inflammatory changes. Vascular/Lymphatic: Extensive atherosclerotic vascular disease is present. Assessment for lymphadenopathy is limited due to the patient's lack of intra-abdominal fat and absence of oral and IV contrast. No definite lymphadenopathy is seen. Reproductive: Uterus and bilateral adnexa are unremarkable. Other: There is a small volume of free pelvic fluid. Musculoskeletal: No lytic or sclerotic lesion is identified. The patient has marked degenerative disc disease at L3-4 and L4-5. There is convex left lumbar scoliosis with the apex at L3-4. 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 There is mild to moderate bilateral hydronephrosis  likely related to obstruction by tumor. Evaluation for metastatic disease is markedly limited by the lack of oral and IV contrast in the patient's cachectic state but no definite metastatic disease is seen. Extensive calcific aortic coronary atherosclerosis. Emphysema. Electronically Signed   By: Inge Rise M.D.   On: 11/12/2017 14:16   Ct Renal Stone Study  Result Date: 11/12/2017 CLINICAL DATA:  30 pound weight loss over the past year in a smoker. Nausea and vomiting  for 2 days. Back pain. EXAM: CT CHEST, ABDOMEN AND PELVIS WITHOUT CONTRAST TECHNIQUE: Multidetector CT imaging of the chest, abdomen and pelvis was performed following the standard protocol without IV contrast. COMPARISON:  CT chest 05/06/2016. FINDINGS: CT CHEST FINDINGS Cardiovascular: There is calcific aortic and coronary atherosclerosis. No pericardial effusion. No aneurysm. Mediastinum/Nodes: No enlarged mediastinal, hilar, or axillary lymph nodes. Thyroid gland, trachea, and esophagus demonstrate no significant findings. Lungs/Pleura: No pleural effusion. The lungs demonstrate extensive emphysematous disease but are clear. Airways are unremarkable. Musculoskeletal: No acute or focal abnormality. CT ABDOMEN PELVIS FINDINGS Hepatobiliary: No focal liver abnormality is seen. No gallstones, gallbladder wall thickening, or biliary dilatation. Pancreas: Unremarkable. No pancreatic ductal dilatation or surrounding inflammatory changes. Spleen: Normal in size without focal abnormality. Adrenals/Urinary Tract: There is a lobulated mass in the right side of the urinary bladder measuring 5.5 cm AP by 5.8 cm transverse by 4.3 cm craniocaudal. A single calcification is seen within the lesion. Mild bilateral hydronephrosis is noted. No urinary tract stones are identified. The adrenal glands are difficult to visualize but there appears to be thickening and hypoattenuation of right adrenal gland most consistent with an adenoma. Stomach/Bowel: Stomach  is within normal limits. Appendix appears normal. No evidence of bowel wall thickening, distention, or inflammatory changes. Vascular/Lymphatic: Extensive atherosclerotic vascular disease is present. Assessment for lymphadenopathy is limited due to the patient's lack of intra-abdominal fat and absence of oral and IV contrast. No definite lymphadenopathy is seen. Reproductive: Uterus and bilateral adnexa are unremarkable. Other: There is a small volume of free pelvic fluid. Musculoskeletal: No lytic or sclerotic lesion is identified. The patient has marked degenerative disc disease at L3-4 and L4-5. There is convex left lumbar scoliosis with the apex at L3-4. 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 There is mild to moderate bilateral hydronephrosis likely related to obstruction by tumor. Evaluation for metastatic disease is markedly limited by the lack of oral and IV contrast in the patient's cachectic state but no definite metastatic disease is seen. Extensive calcific aortic coronary atherosclerosis. Emphysema. Electronically Signed   By: Inge Rise M.D.   On: 11/12/2017 14:16    Procedures Procedures (including critical care time)  Medications Ordered in ED Medications  sodium chloride 0.9 % bolus 1,000 mL (0 mLs Intravenous Stopped 11/12/17 1237)  lactated ringers bolus 1,000 mL (0 mLs Intravenous Stopped 11/12/17 1355)     Initial Impression / Assessment and Plan / ED Course  I have reviewed the triage vital signs and the nursing notes.  Pertinent labs & imaging results that were available during my care of the patient were reviewed by me and considered in my medical decision making (see chart for details).    Pt thin but non-toxic on exam, VS notable for mild tachycardia. EKG shows LVH, diffuse ST depressions and T wave inversions. Previous was from 2017 and shows upright T waves. Pt denies any chest pain or SOB whatsoever. Serial troponins were  negative.  Pt's orthostatic VS were positive. She was given 2L IVF and was ambulatory without symptoms afterwards.  Her labs show stable anemia w/ Hgb 10.3, mild AKI with Cr 1.34 BUN 32.  UA contained blood but patient denies any urinary sx or kidney stone sx. Obtained CT chest/abd/pelvis due to asymptomatic hematuria and problems with weight loss. CT shows bladder mass most likely carcinoma with mild to moderate bilateral hydronephrosis likely due to obstruction.  I suspect this is the reason for her weight loss and  her anemia.  I have explained these findings to her and her family and discussed follow-up to include urology evaluation.  Regarding her orthostatic hypotension, I recommended discontinuing amlodipine.  I discussed options of admission versus discharge with close follow-up in the patient wanted to go home.  She understands need to follow closely with her PCP to further discuss her blood pressure medications and watch her other symptoms.  Extensively reviewed return precautions.  Patient and family voiced understanding and she was discharged in satisfactory condition.  Final Clinical Impressions(s) / ED Diagnoses   Final diagnoses:  Dehydration  Orthostatic hypotension  Bladder mass    ED Discharge Orders    None       Shiraz Bastyr, Wenda Overland, MD 11/12/17 [id number]

## 2017-11-13 ENCOUNTER — Telehealth: Payer: Self-pay | Admitting: Family

## 2017-11-13 LAB — URINE CULTURE
Culture: NO GROWTH
Special Requests: NORMAL

## 2017-11-13 NOTE — Telephone Encounter (Signed)
Pt can stop amlodipine but check bp daily. Want to know if bp exceeds 150/90.  Urologist referral placed. Awaiting for appointment.

## 2017-11-13 NOTE — Telephone Encounter (Signed)
Opened in error

## 2017-11-13 NOTE — Telephone Encounter (Signed)
Patient in office to follow up from ED visit- pt states that she was advised to see urology ASAP- Meredith Mody is calling to get pt an urgent appt. Patient also states that physician that saw her yesterday in ED states that she needs to stop the Amlodipine- b/p med until she gets urology appt. Please advise & see ED encounter

## 2017-11-14 ENCOUNTER — Ambulatory Visit (HOSPITAL_BASED_OUTPATIENT_CLINIC_OR_DEPARTMENT_OTHER)
Admission: RE | Admit: 2017-11-14 | Discharge: 2017-11-14 | Disposition: A | Discharge status: Home / Self Care | Payer: Medicaid Other | Source: Ambulatory Visit | Attending: Medical | Admitting: Medical

## 2017-11-14 ENCOUNTER — Telehealth: Payer: Self-pay | Admitting: Medical

## 2017-11-14 DIAGNOSIS — Z1231 Encounter for screening mammogram for malignant neoplasm of breast: Secondary | ICD-10-CM

## 2017-11-14 NOTE — Telephone Encounter (Signed)
Would you go ahead and start referral to oncologist/[name]. I reviewed ED note. Family and pt are both aware that she may have cancer.

## 2017-11-14 NOTE — Telephone Encounter (Signed)
Thank you. It looks like she has follow up scheduled for 5/9 with you.

## 2017-11-14 NOTE — Telephone Encounter (Signed)
Left message to return call 

## 2017-11-14 NOTE — Telephone Encounter (Signed)
I can see her that day. She also had some severe anemia. Last check in ED that had improved. GI appointment pending.

## 2017-11-20 ENCOUNTER — Encounter: Payer: Self-pay | Admitting: *Deleted

## 2017-11-27 ENCOUNTER — Telehealth: Payer: Self-pay | Admitting: *Deleted

## 2017-11-27 NOTE — Telephone Encounter (Signed)
Pt came in to the office stating she received our letter about oncology and urology referrals. Pt states she was not aware of needing oncology referral. Reports that she saw Dr Gloriann Loan [facility]) on Friday and they are going to do surgery on the mass first. Spoke with PCP, will wait on oncology referral until pt completes workup / further recommendations. Pt voices understanding.

## 2017-11-27 NOTE — Telephone Encounter (Signed)
Noted and agree. 

## 2017-11-28 ENCOUNTER — Other Ambulatory Visit: Payer: Self-pay | Admitting: Urology

## 2017-11-30 ENCOUNTER — Ambulatory Visit (INDEPENDENT_AMBULATORY_CARE_PROVIDER_SITE_OTHER): Payer: Medicaid Other | Admitting: Medical

## 2017-11-30 ENCOUNTER — Telehealth: Payer: Self-pay | Admitting: Medical

## 2017-11-30 ENCOUNTER — Telehealth: Payer: Self-pay | Admitting: Emergency Medicine

## 2017-11-30 ENCOUNTER — Encounter: Payer: Self-pay | Admitting: Medical

## 2017-11-30 ENCOUNTER — Telehealth: Payer: Self-pay | Admitting: Family

## 2017-11-30 VITALS — BP 150/80 | HR 100 | Temp 98.1°F | Resp 16 | Ht 65.0 in | Wt 75.6 lb

## 2017-11-30 DIAGNOSIS — D649 Anemia, unspecified: Secondary | ICD-10-CM | POA: Diagnosis not present

## 2017-11-30 DIAGNOSIS — N3289 Other specified disorders of bladder: Secondary | ICD-10-CM | POA: Diagnosis not present

## 2017-11-30 DIAGNOSIS — I10 Essential (primary) hypertension: Secondary | ICD-10-CM

## 2017-11-30 DIAGNOSIS — R319 Hematuria, unspecified: Secondary | ICD-10-CM

## 2017-11-30 DIAGNOSIS — R944 Abnormal results of kidney function studies: Secondary | ICD-10-CM

## 2017-11-30 LAB — POC URINALSYSI DIPSTICK (AUTOMATED)
Glucose, UA: NEGATIVE
Nitrite, UA: POSITIVE
Spec Grav, UA: 1.02 (ref 1.010–1.025)
Urobilinogen, UA: 2 E.U./dL — AB
pH, UA: 7 (ref 5.0–8.0)

## 2017-11-30 LAB — COMPREHENSIVE METABOLIC PANEL
ALT: 8 U/L (ref 0–35)
AST: 10 U/L (ref 0–37)
Albumin: 3.8 g/dL (ref 3.5–5.2)
Alkaline Phosphatase: 63 U/L (ref 39–117)
BUN: 12 mg/dL (ref 6–23)
CO2: 31 mEq/L (ref 19–32)
Calcium: 9.3 mg/dL (ref 8.4–10.5)
Chloride: 104 mEq/L (ref 96–112)
Creatinine, Ser: 1 mg/dL (ref 0.40–1.20)
GFR: 72.29 mL/min (ref >60.00)
Glucose, Bld: 147 mg/dL — ABNORMAL HIGH (ref 70–99)
Potassium: 4.5 mEq/L (ref 3.5–5.1)
Sodium: 141 mEq/L (ref 135–145)
Total Bilirubin: 0.3 mg/dL (ref 0.2–1.2)
Total Protein: 6.7 g/dL (ref 6.0–8.3)

## 2017-11-30 LAB — CBC WITH DIFFERENTIAL/PLATELET
Basophils Absolute: 0 10*3/uL (ref 0.0–0.1)
Basophils Relative: 0.5 % (ref 0.0–3.0)
Eosinophils Absolute: 0.1 10*3/uL (ref 0.0–0.7)
Eosinophils Relative: 1.5 % (ref 0.0–5.0)
HCT: 25 % — ABNORMAL LOW (ref 36.0–46.0)
Hemoglobin: 7.9 g/dL — CL (ref 12.0–15.0)
Lymphocytes Relative: 16.3 % (ref 12.0–46.0)
Lymphs Abs: 0.8 10*3/uL (ref 0.7–4.0)
MCHC: 31.5 g/dL (ref 30.0–36.0)
MCV: 85.4 fl (ref 78.0–100.0)
Monocytes Absolute: 0.4 10*3/uL (ref 0.1–1.0)
Monocytes Relative: 6.9 % (ref 3.0–12.0)
Neutro Abs: 3.9 10*3/uL (ref 1.4–7.7)
Neutrophils Relative %: 74.8 % (ref 43.0–77.0)
Platelets: 309 10*3/uL (ref 150.0–400.0)
RBC: 2.92 Mil/uL — ABNORMAL LOW (ref 3.87–5.11)
RDW: 29.1 % — ABNORMAL HIGH (ref 11.5–15.5)
WBC: 5.2 10*3/uL (ref 4.0–10.5)

## 2017-11-30 MED ORDER — ATORVASTATIN CALCIUM 80 MG PO TABS: 80.0000 mg | ORAL_TABLET | Freq: Every day | ORAL | 11 refills | Status: DC

## 2017-11-30 MED ORDER — CIPROFLOXACIN HCL 250 MG PO TABS: 250.0000 mg | ORAL_TABLET | Freq: Two times a day (BID) | ORAL | 0 refills | Status: DC

## 2017-11-30 MED ORDER — PHENAZOPYRIDINE HCL 200 MG PO TABS: 200.0000 mg | ORAL_TABLET | Freq: Three times a day (TID) | ORAL | 0 refills | Status: DC | PRN

## 2017-11-30 NOTE — Telephone Encounter (Signed)
Future stat cbc on Monday 13th at 10:45.

## 2017-11-30 NOTE — Telephone Encounter (Signed)
Reviewed see the result note on cbc.

## 2017-11-30 NOTE — Telephone Encounter (Signed)
Refilled pt atorvastatin.

## 2017-11-30 NOTE — Telephone Encounter (Signed)
Copied from [place] [phone]. Topic: Quick Communication - See Telephone Encounter >> Nov 30, 2017  4:30 PM R[place]ford Nail, [place] wrote: CRM for notification. See Telephone encounter for: 11/30/17. Patient calling and states that the pharmacy was telling her that the atorvastatin was not sent to the pharmacy. States that [employer] told her that he would send in that prescription along with her other medications. Please advise. CB#: [phone]

## 2017-11-30 NOTE — Progress Notes (Signed)
Subjective:    Patient ID: Donna Curry, female    DOB: 10-Apr-1956, 62 y.o.   MRN: [id number]  HPI  Pt in for follow up. She is scheduled for surgery to remove bladder mass on Dec 13, 2017.   Pt states just recently had some small blood clots when she urinates.   Pt tells me that ED told her to stop all of her medications. But she only stopped taking the blood pressure medication. Pt does not remember what her bp level was on day of her urologist office visit. She does have history of anemia.  I had written he for amlodipine. Not sure why not on active list. Then on review  I saw she had orthostatic hypotension. Today on bp check her standing bp same as sitting. No dizziness.  Pt bp high initially when MA checked in office but no cardiac or neurologic signs or symptoms.    Review of Systems  Constitutional: Positive for fatigue. Negative for chills and fever.       Still feeling some fatigue.Marland Kitchen Hx of anemia.  Respiratory: Negative for chest tightness, shortness of breath, wheezing and stridor.   Cardiovascular: Negative for chest pain and palpitations.  Gastrointestinal: Negative for abdominal pain.  Genitourinary: Positive for dysuria and hematuria. Negative for decreased urine volume, difficulty urinating, flank pain, frequency and vaginal pain.  Musculoskeletal: Negative for back pain and myalgias.  Skin: Negative for rash.  Neurological: Negative for dizziness, seizures, syncope, weakness, numbness and headaches.  Hematological: Negative for adenopathy. Does not bruise/bleed easily.  Psychiatric/Behavioral: Negative for behavioral problems and confusion. The patient is not nervous/anxious.     Past Medical History:  Diagnosis Date  . Hypertension   . Measles as a child  . Mumps as a child  . Tobacco abuse      Social History   Socioeconomic History  . Marital status: Single    Spouse name: Not on file  . Number of children: Not on file  . Years of education: Not  on file  . Highest education level: Not on file  Occupational History  . Not on file  Social Needs  . Financial resource strain: Not on file  . Food insecurity:    Worry: Not on file    Inability: Not on file  . Transportation needs:    Medical: Not on file    Non-medical: Not on file  Tobacco Use  . Smoking status: Current Some Day Smoker    Packs/day: 0.50    Types: Cigarettes    Last attempt to quit: 03/22/2013    Years since quitting: 4.6  . Smokeless tobacco: Never Used  . Tobacco comment: 1/4 pack per day,Hasnt smoked in 8 days  Substance and Sexual Activity  . Alcohol use: No    Alcohol/week: 0.0 oz  . Drug use: Yes    Comment: marijuana   . Sexual activity: Never  Lifestyle  . Physical activity:    Days per week: Not on file    Minutes per session: Not on file  . Stress: Not on file  Relationships  . Social connections:    Talks on phone: Not on file    Gets together: Not on file    Attends religious service: Not on file    Active member of club or organization: Not on file    Attends meetings of clubs or organizations: Not on file    Relationship status: Not on file  . Intimate partner violence:  Fear of current or ex partner: Not on file    Emotionally abused: Not on file    Physically abused: Not on file    Forced sexual activity: Not on file  Other Topics Concern  . Not on file  Social History Narrative   Denies hx of drug use   Single   1 daughter age 66 lives with daughter and grandson who is 22.   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    Past Surgical History:  Procedure Laterality Date  . tubes tied    . uterine ablation     about 2005    Family History  Problem Relation Age of Onset  . Hypertension Mother   . Diabetes Father   . Hypertension Brother   . HIV Brother     No Known Allergies  Current Outpatient Medications on File Prior to Visit  Medication Sig Dispense Refill  . acetaminophen  (TYLENOL) 500 MG tablet Take 1,000 mg by mouth every 8 (eight) hours as needed for moderate pain.    Marland Kitchen aspirin EC 81 MG EC tablet Take 1 tablet (81 mg total) by mouth daily.    Marland Kitchen atorvastatin (LIPITOR) 80 MG tablet Take 1 tablet (80 mg total) by mouth daily at 6 PM. 30 tablet 11  . ciprofloxacin (CIPRO) 250 MG tablet Take 1 tablet (250 mg total) by mouth 2 (two) times daily. 6 tablet 0  . diclofenac (VOLTAREN) 75 MG EC tablet Take 1 tablet (75 mg total) by mouth 2 (two) times daily. 14 tablet 0  . ferrous sulfate 325 (65 FE) MG tablet Take 1 tablet (325 mg total) by mouth 2 (two) times daily with a meal. 60 tablet 3  . nicotine (NICODERM CQ - DOSED IN MG/24 HOURS) 21 mg/24hr patch Place 1 patch (21 mg total) onto the skin daily. 28 patch 0  . phenazopyridine (PYRIDIUM) 200 MG tablet Take 1 tablet (200 mg total) by mouth 3 (three) times daily as needed for pain. 10 tablet 0  . Vitamin D, Ergocalciferol, (DRISDOL) 50000 units CAPS capsule Take 1 capsule (50,000 Units total) by mouth every 7 (seven) days. 8 capsule 0   No current facility-administered medications on file prior to visit.     BP (!) 150/80 Comment: second check 160/80  Pulse 100   Temp 98.1 F (36.7 C) (Oral)   Resp 16   Ht 5\' 5"  (1.651 m)   Wt 75 lb 9.6 oz (34.3 kg)   SpO2 100%   BMI 12.58 kg/m      Objective:   Physical Exam  General Mental Status- Alert. General Appearance- Not in acute distress.  Very thin appearance to patient.  Skin General: Color- Normal Color. Moisture- Normal Moisture.  Neck Carotid Arteries- Normal color. Moisture- Normal Moisture. No carotid bruits. No JVD.  Chest and Lung Exam Auscultation: Breath Sounds:-Normal.  Cardiovascular Auscultation:Rythm- Regular. Murmurs & Other Heart Sounds:Auscultation of the heart reveals- No Murmurs.  Abdomen Inspection:-Inspeection Normal. Palpation/Percussion:Note:No mass. Palpation and Percussion of the abdomen reveal- Non Tender, Non Distended  + BS, no rebound or guarding.    Neurologic Cranial Nerve exam:- CN III-XII intact(No nystagmus), symmetric smile. Strength:- 5/5 equal and symmetric strength both upper and lower extremities.  Back-no CVA tenderness    Assessment & Plan:  Follow-up with urologist for your bladder mass.  Surgery planned for end of this month.  For your history of hematuria and some recurrence today, we will get urine  sample and send out urine culture.  You have history of anemia and now on iron.  Want to repeat a CBC and make sure hemoglobin/hematocrit stable.  Recent decrease in GFR.  Emergency department treated for dehydration.  Want to repeat metabolic panel to see if lab looks improved.  Your recently advised to stop amlodipine by ED MD.  That was in the context of dehydration and orthostatic hypotension.  Her blood pressure is elevated now again and not having orthostatic low blood pressure.  So do recommend that you restart amlodipine.  If you start to have recurrent dizziness or lightheaded on standing please let us know and we will need to recheck your blood pressure.  Might in that event advise you to decrease dose or stop the medication again.  Try to stay well-hydrated.  Follow-up in 4 to 5 weeks or as needed.  Mackie Pai, PA-C

## 2017-11-30 NOTE — Telephone Encounter (Signed)
Opened to review 

## 2017-11-30 NOTE — Patient Instructions (Addendum)
Follow-up with urologist for your bladder mass.  Surgery planned for end of this month.  For your history of hematuria and some recurrence today, we will get urine sample and send out urine culture.  You have history of anemia and now on iron.  Want to repeat a CBC and make sure hemoglobin/hematocrit stable.  Recent decrease in GFR.  Emergency department treated for dehydration.  Want to repeat metabolic panel to see if lab looks improved.  Your recently advised to stop amlodipine by ED MD.  That was in the context of dehydration and orthostatic hypotension.  Her blood pressure is elevated now again and not having orthostatic low blood pressure.  So do recommend that you restart amlodipine.  If you start to have recurrent dizziness or lightheaded on standing please let us know and we will need to recheck your blood pressure.  Might in that event advise you to decrease dose or stop the medication again.  Try to stay well-hydrated.  Follow-up in 4 to 5 weeks or as needed.  Decided to prescribe low-dose Cipro 250 mg twice daily for 3 days pending urine culture results.  UA results showed 3+ leukocytes.

## 2017-11-30 NOTE — Telephone Encounter (Signed)
"  CRITICAL VALUE STICKER  CRITICAL VALUE:HgB  RECEIVER (on-site recipient of call):Amyla Heffner P.  DATE & TIME NOTIFIED: 11-30-17  MESSENGER (representative from lab):Karen  MD NOTIFIED: [place]  RESPONSE: Put in phone note and give results to [facility], he will take care of it.

## 2017-11-30 NOTE — Telephone Encounter (Signed)
[facility] is  handling in PCP absence; forwarded to provider/SLS 05/09

## 2017-12-01 ENCOUNTER — Telehealth: Payer: Self-pay

## 2017-12-01 MED ORDER — ATORVASTATIN CALCIUM 80 MG PO TABS: 80.0000 mg | ORAL_TABLET | Freq: Every day | ORAL | 11 refills | Status: DC

## 2017-12-01 NOTE — Telephone Encounter (Signed)
Recent atorvastatin to pt pharmacy. I don't think she need diclofenac. Prefer she not take nsaid presently.

## 2017-12-01 NOTE — Telephone Encounter (Signed)
Diclofenac 50mg  tablets not covered by Medicaid.

## 2017-12-01 NOTE — Telephone Encounter (Signed)
I don't want her to be on it presently. So advise not to fill.

## 2017-12-02 LAB — URINE CULTURE
MICRO NUMBER:: 90567594
Result:: NO GROWTH
SPECIMEN QUALITY:: ADEQUATE

## 2017-12-04 ENCOUNTER — Other Ambulatory Visit (INDEPENDENT_AMBULATORY_CARE_PROVIDER_SITE_OTHER): Payer: Medicaid Other

## 2017-12-04 ENCOUNTER — Telehealth: Payer: Self-pay | Admitting: *Deleted

## 2017-12-04 ENCOUNTER — Other Ambulatory Visit: Payer: Medicaid Other

## 2017-12-04 DIAGNOSIS — D649 Anemia, unspecified: Secondary | ICD-10-CM

## 2017-12-04 DIAGNOSIS — R739 Hyperglycemia, unspecified: Secondary | ICD-10-CM | POA: Diagnosis not present

## 2017-12-04 DIAGNOSIS — D729 Disorder of white blood cells, unspecified: Secondary | ICD-10-CM

## 2017-12-04 LAB — IBC PANEL
Iron: 379 ug/dL — ABNORMAL HIGH (ref 42–145)
Saturation Ratios: 98.8 % — ABNORMAL HIGH (ref 20.0–50.0)
Transferrin: 274 mg/dL (ref 212.0–360.0)

## 2017-12-04 LAB — CBC WITH DIFFERENTIAL/PLATELET
Basophils Absolute: 0 10*3/uL (ref 0.0–0.1)
Basophils Relative: 0.6 % (ref 0.0–3.0)
Eosinophils Absolute: 0.1 10*3/uL (ref 0.0–0.7)
Eosinophils Relative: 1.9 % (ref 0.0–5.0)
HCT: 26 % — ABNORMAL LOW (ref 36.0–46.0)
Hemoglobin: 8 g/dL — CL (ref 12.0–15.0)
Lymphocytes Relative: 14.6 % (ref 12.0–46.0)
Lymphs Abs: 0.9 10*3/uL (ref 0.7–4.0)
MCHC: 30.8 g/dL (ref 30.0–36.0)
MCV: 86.4 fl (ref 78.0–100.0)
Monocytes Absolute: 0.6 10*3/uL (ref 0.1–1.0)
Monocytes Relative: 9.5 % (ref 3.0–12.0)
Neutro Abs: 4.3 10*3/uL (ref 1.4–7.7)
Neutrophils Relative %: 73.4 % (ref 43.0–77.0)
Platelets: 326 10*3/uL (ref 150.0–400.0)
RBC: 3.01 Mil/uL — ABNORMAL LOW (ref 3.87–5.11)
RDW: 28.1 % — ABNORMAL HIGH (ref 11.5–15.5)
WBC: 5.9 10*3/uL (ref 4.0–10.5)

## 2017-12-04 LAB — FERRITIN: Ferritin: 151.5 ng/mL (ref 10.0–291.0)

## 2017-12-04 LAB — HEMOGLOBIN A1C: Hgb A1c MFr Bld: 4.5 % — ABNORMAL LOW (ref 4.6–6.5)

## 2017-12-04 LAB — PATHOLOGIST SMEAR REVIEW

## 2017-12-04 NOTE — Telephone Encounter (Signed)
CRITICAL VALUE STICKER  CRITICAL VALUE:Hgb:8.00,Hct:26.00  RECEIVER (on-site recipient of call):Angie  DATE & TIME NOTIFIED:12/04/16 @ 11:42pm  MESSENGER (representative from lab):Susan  MD NOTIFIED:Edward Saguier,PA-C  TIME OF NOTIFICATION:11:46pm  RESPONSE:Message with the information send to [facility] to address.//AB/CMA

## 2017-12-06 ENCOUNTER — Ambulatory Visit (INDEPENDENT_AMBULATORY_CARE_PROVIDER_SITE_OTHER): Payer: Medicaid Other | Admitting: Medical

## 2017-12-06 ENCOUNTER — Encounter: Payer: Self-pay | Admitting: Medical

## 2017-12-06 VITALS — BP 139/70 | HR 106 | Temp 98.2°F | Resp 16 | Ht 65.0 in | Wt 75.2 lb

## 2017-12-06 DIAGNOSIS — D649 Anemia, unspecified: Secondary | ICD-10-CM

## 2017-12-06 DIAGNOSIS — N3289 Other specified disorders of bladder: Secondary | ICD-10-CM

## 2017-12-06 LAB — CBC WITH DIFFERENTIAL/PLATELET
Basophils Absolute: 0 10*3/uL (ref 0.0–0.1)
Basophils Relative: 0.7 % (ref 0.0–3.0)
Eosinophils Absolute: 0.1 10*3/uL (ref 0.0–0.7)
Eosinophils Relative: 1.9 % (ref 0.0–5.0)
HCT: 27.3 % — ABNORMAL LOW (ref 36.0–46.0)
Hemoglobin: 8.6 g/dL — ABNORMAL LOW (ref 12.0–15.0)
Lymphocytes Relative: 21.2 % (ref 12.0–46.0)
Lymphs Abs: 1.4 10*3/uL (ref 0.7–4.0)
MCHC: 31.6 g/dL (ref 30.0–36.0)
MCV: 89.6 fl (ref 78.0–100.0)
Monocytes Absolute: 0.5 10*3/uL (ref 0.1–1.0)
Monocytes Relative: 7.5 % (ref 3.0–12.0)
Neutro Abs: 4.5 10*3/uL (ref 1.4–7.7)
Neutrophils Relative %: 68.7 % (ref 43.0–77.0)
Platelets: 354 10*3/uL (ref 150.0–400.0)
RBC: 3.05 Mil/uL — ABNORMAL LOW (ref 3.87–5.11)
RDW: 29.2 % — ABNORMAL HIGH (ref 11.5–15.5)
WBC: 6.5 10*3/uL (ref 4.0–10.5)

## 2017-12-06 NOTE — Patient Instructions (Signed)
You do have upcoming surgery for bladder mass this coming Wednesday.  Your anemia level has been stable over the last 2 checks.  We will go ahead and repeat CBC today and you have upcoming preop appointment this Friday.  You have copies of recent past 2 CBC results.  We will also call you on blood work results today.  Some reservation expressed regarding getting a transfusion prior to your procedure.  I want you to show your surgeon the lab results.  I have also already sent him a note informing him of your hemoglobin/hematocrit level.  Follow-up with your PCP as regularly scheduled.  Or as needed prior to your procedure.

## 2017-12-06 NOTE — Progress Notes (Signed)
Subjective:    Patient ID: Donna Curry, female    DOB: 1955-10-04, 62 y.o.   MRN: [id number]  HPI   Pt in for follow up.  Pt  anemia but stable recently. Pt daughter not sure if she wants her mom to get transfusion. Daughter has question on  erythropoitin use. She would prefer this if hb/hct needs to increase before surgery.  Pt is feeling good energy wise. Overall more energy than before.  Pt urine has been looking clearer/better. No obvious bleeding recently.  Pt has upcoming surgery next week Dec 13, 2017 for bladder mass.   Review of Systems  Constitutional: Positive for fatigue. Negative for chills and fever.       Energy improved compared to last visit per patient.  Respiratory: Negative for cough, chest tightness, shortness of breath and wheezing.   Cardiovascular: Negative for chest pain and palpitations.  Gastrointestinal: Negative for abdominal distention, abdominal pain, blood in stool, diarrhea and vomiting.  Musculoskeletal: Negative for back pain and gait problem.  Skin: Negative for pallor and rash.  Neurological: Negative for dizziness, syncope, speech difficulty, weakness, light-headedness and headaches.  Hematological: Negative for adenopathy. Does not bruise/bleed easily.  Psychiatric/Behavioral: Negative for behavioral problems, confusion, hallucinations and sleep disturbance. The patient is not nervous/anxious.    Past Medical History:  Diagnosis Date  . Hypertension   . Measles as a child  . Mumps as a child  . Tobacco abuse      Social History   Socioeconomic History  . Marital status: Single    Spouse name: Not on file  . Number of children: Not on file  . Years of education: Not on file  . Highest education level: Not on file  Occupational History  . Not on file  Social Needs  . Financial resource strain: Not on file  . Food insecurity:    Worry: Not on file    Inability: Not on file  . Transportation needs:    Medical: Not on file      Non-medical: Not on file  Tobacco Use  . Smoking status: Current Some Day Smoker    Packs/day: 0.50    Types: Cigarettes    Last attempt to quit: 03/22/2013    Years since quitting: 4.7  . Smokeless tobacco: Never Used  . Tobacco comment: 1/4 pack per day,Hasnt smoked in 8 days  Substance and Sexual Activity  . Alcohol use: No    Alcohol/week: 0.0 oz  . Drug use: Yes    Comment: marijuana   . Sexual activity: Never  Lifestyle  . Physical activity:    Days per week: Not on file    Minutes per session: Not on file  . Stress: Not on file  Relationships  . Social connections:    Talks on phone: Not on file    Gets together: Not on file    Attends religious service: Not on file    Active member of club or organization: Not on file    Attends meetings of clubs or organizations: Not on file    Relationship status: Not on file  . Intimate partner violence:    Fear of current or ex partner: Not on file    Emotionally abused: Not on file    Physically abused: Not on file    Forced sexual activity: Not on file  Other Topics Concern  . Not on file  Social History Narrative   Denies hx of drug use   Single  1 daughter age 45 lives with daughter and grandson who is 30.   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    Past Surgical History:  Procedure Laterality Date  . tubes tied    . uterine ablation     about 2005    Family History  Problem Relation Age of Onset  . Hypertension Mother   . Diabetes Father   . Hypertension Brother   . HIV Brother     No Known Allergies  Current Outpatient Medications on File Prior to Visit  Medication Sig Dispense Refill  . acetaminophen (TYLENOL) 500 MG tablet Take 1,000 mg by mouth every 8 (eight) hours as needed for moderate pain.    Marland Kitchen aspirin EC 81 MG EC tablet Take 1 tablet (81 mg total) by mouth daily.    Marland Kitchen atorvastatin (LIPITOR) 80 MG tablet Take 1 tablet (80 mg total) by mouth daily at 6 PM. 30  tablet 11  . ciprofloxacin (CIPRO) 250 MG tablet Take 1 tablet (250 mg total) by mouth 2 (two) times daily. 6 tablet 0  . diclofenac (VOLTAREN) 75 MG EC tablet Take 1 tablet (75 mg total) by mouth 2 (two) times daily. 14 tablet 0  . ferrous sulfate 325 (65 FE) MG tablet Take 1 tablet (325 mg total) by mouth 2 (two) times daily with a meal. 60 tablet 3  . nicotine (NICODERM CQ - DOSED IN MG/24 HOURS) 21 mg/24hr patch Place 1 patch (21 mg total) onto the skin daily. 28 patch 0  . phenazopyridine (PYRIDIUM) 200 MG tablet Take 1 tablet (200 mg total) by mouth 3 (three) times daily as needed for pain. 10 tablet 0  . Vitamin D, Ergocalciferol, (DRISDOL) 50000 units CAPS capsule Take 1 capsule (50,000 Units total) by mouth every 7 (seven) days. 8 capsule 0   No current facility-administered medications on file prior to visit.     BP (!) 158/88   Pulse (!) 106   Temp 98.2 F (36.8 C) (Oral)   Resp 16   Ht 5\' 5"  (1.651 m)   Wt 75 lb 3.2 oz (34.1 kg)   SpO2 100%   BMI 12.51 kg/m       Objective:   Physical Exam  General Mental Status- Alert. General Appearance- Not in acute distress.   Skin General: Color- Normal Color. Moisture- Normal Moisture.  Neck Carotid Arteries- Normal color. Moisture- Normal Moisture. No carotid bruits. No JVD.  Chest and Lung Exam Auscultation: Breath Sounds:-Normal.  Cardiovascular Auscultation:Rythm- Regular. Murmurs & Other Heart Sounds:Auscultation of the heart reveals- No Murmurs.  Abdomen Inspection:-Inspeection Normal. Palpation/Percussion:Note:No mass. Palpation and Percussion of the abdomen reveal- Non Tender, Non Distended + BS, no rebound or guarding.    Neurologic Cranial Nerve exam:- CN III-XII intact(No nystagmus), symmetric smile. Strength:- 5/5 equal and symmetric strength both upper and lower extremities.      Assessment & Plan:  You do have upcoming surgery for bladder mass this coming Wednesday.  Your anemia level has been  stable over the last 2 checks.  We will Curry ahead and repeat CBC today and you have upcoming preop appointment this Friday.  You have copies of recent past 2 CBC results.  We will also call you on blood work results today.  Some reservation expressed regarding getting a transfusion prior to your procedure.  I want you to show your surgeon the lab results.  I have also already sent him a note informing him of  your hemoglobin/hematocrit level.  Follow-up with your PCP as regularly scheduled.  Or as needed prior to your procedure.  Mackie Pai, PA-C

## 2017-12-07 ENCOUNTER — Encounter (HOSPITAL_COMMUNITY): Payer: Self-pay

## 2017-12-07 NOTE — Patient Instructions (Signed)
Donna Curry  12/07/2017   Your procedure is scheduled on: 12-07-17  Report to [facility] Main  Entrance   Report to admitting at     0830AM    Call this number if you have problems the morning of surgery [phone]   Remember: Do not eat food or drink liquids :After Midnight.     Take these medicines the morning of surgery with A SIP OF WATER:                                 You may not have any metal on your body including hair pins and              piercings  Do not wear jewelry, make-up, lotions, powders or perfumes, deodorant             Do not wear nail polish.  Do not shave  48 hours prior to surgery.            Do not bring valuables to the hospital. [place].  Contacts, dentures or bridgework may not be worn into surgery.     Patients discharged the day of surgery will not be allowed to drive home.  Name and phone number of your driver:  Special Instructions: N/A              Please read over the following fact sheets you were given: _____________________________________________________________________          [facility] - Preparing for Surgery Before surgery, you can play an important role.  Because skin is not sterile, your skin needs to be as free of germs as possible.  You can reduce the number of germs on your skin by washing with CHG (chlorahexidine gluconate) soap before surgery.  CHG is an antiseptic cleaner which kills germs and bonds with the skin to continue killing germs even after washing. Please DO NOT use if you have an allergy to CHG or antibacterial soaps.  If your skin becomes reddened/irritated stop using the CHG and inform your nurse when you arrive at Short Stay. Do not shave (including legs and underarms) for at least 48 hours prior to the first CHG shower.  You may shave your face/neck. Please follow these instructions carefully:  1.  Shower with CHG Soap the night  before surgery and the  morning of Surgery.  2.  If you choose to wash your hair, wash your hair first as usual with your  normal  shampoo.  3.  After you shampoo, rinse your hair and body thoroughly to remove the  shampoo.                           4.  Use CHG as you would any other liquid soap.  You can apply chg directly  to the skin and wash                       Gently with a scrungie or clean washcloth.  5.  Apply the CHG Soap to your body ONLY FROM THE NECK DOWN.   Do not use on face/ open  Wound or open sores. Avoid contact with eyes, ears mouth and genitals (private parts).                       Wash face,  Genitals (private parts) with your normal soap.             6.  Wash thoroughly, paying special attention to the area where your surgery  will be performed.  7.  Thoroughly rinse your body with warm water from the neck down.  8.  DO NOT shower/wash with your normal soap after using and rinsing off  the CHG Soap.                9.  Pat yourself dry with a clean towel.            10.  Wear clean pajamas.            11.  Place clean sheets on your bed the night of your first shower and do not  sleep with pets. Day of Surgery : Do not apply any lotions/deodorants the morning of surgery.  Please wear clean clothes to the hospital/surgery center.  FAILURE TO FOLLOW THESE INSTRUCTIONS MAY RESULT IN THE CANCELLATION OF YOUR SURGERY PATIENT SIGNATURE_________________________________  NURSE SIGNATURE__________________________________  ________________________________________________________________________

## 2017-12-08 ENCOUNTER — Other Ambulatory Visit: Payer: Self-pay

## 2017-12-08 ENCOUNTER — Encounter (HOSPITAL_COMMUNITY): Payer: Self-pay

## 2017-12-08 ENCOUNTER — Telehealth: Payer: Self-pay | Admitting: Family

## 2017-12-08 ENCOUNTER — Encounter (HOSPITAL_COMMUNITY)
Admission: RE | Admit: 2017-12-08 | Discharge: 2017-12-08 | Disposition: A | Discharge status: Home / Self Care | Payer: Medicaid Other | Source: Ambulatory Visit | Attending: Urology | Admitting: Urology

## 2017-12-08 DIAGNOSIS — Z01812 Encounter for preprocedural laboratory examination: Secondary | ICD-10-CM | POA: Insufficient documentation

## 2017-12-08 DIAGNOSIS — N1330 Unspecified hydronephrosis: Secondary | ICD-10-CM | POA: Diagnosis not present

## 2017-12-08 DIAGNOSIS — N329 Bladder disorder, unspecified: Secondary | ICD-10-CM | POA: Insufficient documentation

## 2017-12-08 HISTORY — DX: Transient cerebral ischemic attack, unspecified: G45.9

## 2017-12-08 HISTORY — DX: Family history of other specified conditions: Z84.89

## 2017-12-08 HISTORY — DX: Anemia, unspecified: D64.9

## 2017-12-08 LAB — CBC
HCT: 30.1 % — ABNORMAL LOW (ref 36.0–46.0)
Hemoglobin: 8.9 g/dL — ABNORMAL LOW (ref 12.0–15.0)
MCH: 27.1 pg (ref 26.0–34.0)
MCHC: 29.6 g/dL — ABNORMAL LOW (ref 30.0–36.0)
MCV: 91.8 fL (ref 78.0–100.0)
Platelets: 346 10*3/uL (ref 150–400)
RBC: 3.28 MIL/uL — ABNORMAL LOW (ref 3.87–5.11)
RDW: 26.1 % — ABNORMAL HIGH (ref 11.5–15.5)
WBC: 6.5 10*3/uL (ref 4.0–10.5)

## 2017-12-08 NOTE — Progress Notes (Signed)
12-08-17 CBC result routed to [name] for review

## 2017-12-08 NOTE — Telephone Encounter (Signed)
Telephone call to pt. To inform  Lab results of 12/06/17. Per Ed Saguier. Results notes review with pt. From 12/07/17.  Pt. Voiced understanding. Informed pt to pick up copies of recent cbc's from office, to take to [place] pt. To pick up copies of labs to take to [facility].       (telephone note created due to the fact no result note forwarded)

## 2017-12-08 NOTE — Telephone Encounter (Signed)
Copied from [place] [id number]. Topic: Quick Communication - Lab Results >> Dec 08, 2017  1:31 PM Hinton Dyer, [place] wrote: Called patient to inform them of 12/07/2017 lab results. When patient returns call, triage nurse may disclose results.  Pt returned call, call back at  [phone]

## 2017-12-11 NOTE — Telephone Encounter (Signed)
Results printed and placed at the front desk for pick up. Left message on voicemail that results are ready and to call if any questions.

## 2017-12-13 ENCOUNTER — Observation Stay (HOSPITAL_COMMUNITY)
Admission: RE | Admit: 2017-12-13 | Discharge: 2017-12-15 | Disposition: A | Discharge status: Home / Self Care | Payer: Medicaid Other | Source: Ambulatory Visit | Attending: Urology | Admitting: Urology

## 2017-12-13 ENCOUNTER — Observation Stay (HOSPITAL_COMMUNITY): Payer: Medicaid Other

## 2017-12-13 ENCOUNTER — Ambulatory Visit (HOSPITAL_COMMUNITY): Payer: Medicaid Other | Admitting: Certified Registered Nurse Anesthetist

## 2017-12-13 ENCOUNTER — Encounter (HOSPITAL_COMMUNITY)
Admission: RE | Disposition: A | Discharge status: Home / Self Care | Payer: Self-pay | Source: Ambulatory Visit | Attending: Urology

## 2017-12-13 ENCOUNTER — Other Ambulatory Visit: Payer: Self-pay

## 2017-12-13 ENCOUNTER — Encounter (HOSPITAL_COMMUNITY): Payer: Self-pay | Admitting: *Deleted

## 2017-12-13 ENCOUNTER — Ambulatory Visit (HOSPITAL_COMMUNITY): Payer: Medicaid Other

## 2017-12-13 DIAGNOSIS — Z79899 Other long term (current) drug therapy: Secondary | ICD-10-CM | POA: Insufficient documentation

## 2017-12-13 DIAGNOSIS — C672 Malignant neoplasm of lateral wall of bladder: Secondary | ICD-10-CM | POA: Diagnosis present

## 2017-12-13 DIAGNOSIS — I10 Essential (primary) hypertension: Secondary | ICD-10-CM | POA: Diagnosis not present

## 2017-12-13 DIAGNOSIS — C679 Malignant neoplasm of bladder, unspecified: Secondary | ICD-10-CM | POA: Diagnosis present

## 2017-12-13 DIAGNOSIS — I739 Peripheral vascular disease, unspecified: Secondary | ICD-10-CM | POA: Insufficient documentation

## 2017-12-13 DIAGNOSIS — F17200 Nicotine dependence, unspecified, uncomplicated: Secondary | ICD-10-CM | POA: Insufficient documentation

## 2017-12-13 DIAGNOSIS — N1330 Unspecified hydronephrosis: Secondary | ICD-10-CM | POA: Diagnosis not present

## 2017-12-13 HISTORY — PX: TRANSURETHRAL RESECTION OF BLADDER TUMOR: SHX2575

## 2017-12-13 HISTORY — PX: CYSTOSCOPY W/ URETERAL STENT PLACEMENT: SHX1429

## 2017-12-13 LAB — BASIC METABOLIC PANEL
Anion gap: 10 (ref 5–15)
BUN: 10 mg/dL (ref 6–20)
CO2: 26 mmol/L (ref 22–32)
Calcium: 8.9 mg/dL (ref 8.9–10.3)
Chloride: 104 mmol/L (ref 101–111)
Creatinine, Ser: 1 mg/dL (ref 0.44–1.00)
GFR calc Af Amer: >60 mL/min (ref >60)
GFR calc non Af Amer: 60 mL/min — ABNORMAL LOW (ref >60)
Glucose, Bld: 125 mg/dL — ABNORMAL HIGH (ref 65–99)
Potassium: 4.4 mmol/L (ref 3.5–5.1)
Sodium: 140 mmol/L (ref 135–145)

## 2017-12-13 LAB — HEMOGLOBIN AND HEMATOCRIT, BLOOD
HCT: 37.3 % (ref 36.0–46.0)
Hemoglobin: 11.7 g/dL — ABNORMAL LOW (ref 12.0–15.0)

## 2017-12-13 LAB — PREPARE RBC (CROSSMATCH)

## 2017-12-13 LAB — ABO/RH: ABO/RH(D): A POS

## 2017-12-13 SURGERY — TURBT (TRANSURETHRAL RESECTION OF BLADDER TUMOR)
Anesthesia: General | Laterality: Right

## 2017-12-13 MED ORDER — STERILE WATER FOR IRRIGATION IR SOLN
Status: DC | PRN
  Administered 2017-12-13: 500 mL

## 2017-12-13 MED ORDER — DIPHENHYDRAMINE HCL 50 MG/ML IJ SOLN: 12.5000 mg | Freq: Four times a day (QID) | INTRAMUSCULAR | Status: DC | PRN

## 2017-12-13 MED ORDER — NICOTINE 21 MG/24HR TD PT24
21.0000 mg | MEDICATED_PATCH | Freq: Every day | TRANSDERMAL | Status: DC
  Administered 2017-12-13 – 2017-12-15 (×3): 21 mg via TRANSDERMAL
  Filled 2017-12-13 (×3): qty 1

## 2017-12-13 MED ORDER — DIPHENHYDRAMINE HCL 12.5 MG/5ML PO ELIX: 12.5000 mg | ORAL_SOLUTION | Freq: Four times a day (QID) | ORAL | Status: DC | PRN

## 2017-12-13 MED ORDER — FENTANYL CITRATE (PF) 100 MCG/2ML IJ SOLN
INTRAMUSCULAR | Status: AC
  Administered 2017-12-13: 25 ug via INTRAVENOUS
  Filled 2017-12-13: qty 2

## 2017-12-13 MED ORDER — ONDANSETRON HCL 4 MG/2ML IJ SOLN
INTRAMUSCULAR | Status: AC
  Filled 2017-12-13: qty 2

## 2017-12-13 MED ORDER — CEFAZOLIN SODIUM-DEXTROSE 2-4 GM/100ML-% IV SOLN
2.0000 g | INTRAVENOUS | Status: AC
  Administered 2017-12-13: 2 g via INTRAVENOUS
  Filled 2017-12-13: qty 100

## 2017-12-13 MED ORDER — ACETAMINOPHEN 325 MG PO TABS: 650.0000 mg | ORAL_TABLET | ORAL | Status: DC | PRN

## 2017-12-13 MED ORDER — LACTATED RINGERS IV SOLN: INTRAVENOUS | Status: DC

## 2017-12-13 MED ORDER — ATORVASTATIN CALCIUM 40 MG PO TABS
80.0000 mg | ORAL_TABLET | Freq: Every day | ORAL | Status: DC
  Administered 2017-12-13 – 2017-12-14 (×2): 80 mg via ORAL
  Filled 2017-12-13 (×2): qty 2

## 2017-12-13 MED ORDER — SUGAMMADEX SODIUM 200 MG/2ML IV SOLN
INTRAVENOUS | Status: AC
  Filled 2017-12-13: qty 2

## 2017-12-13 MED ORDER — MIDAZOLAM HCL 5 MG/5ML IJ SOLN
INTRAMUSCULAR | Status: DC | PRN
  Administered 2017-12-13: 2 mg via INTRAVENOUS

## 2017-12-13 MED ORDER — DOCUSATE SODIUM 100 MG PO CAPS
100.0000 mg | ORAL_CAPSULE | Freq: Two times a day (BID) | ORAL | Status: DC
  Administered 2017-12-13 – 2017-12-15 (×4): 100 mg via ORAL
  Filled 2017-12-13 (×4): qty 1

## 2017-12-13 MED ORDER — SODIUM CHLORIDE 0.9 % IV SOLN
Freq: Once | INTRAVENOUS | Status: AC
  Administered 2017-12-13: 12:00:00 via INTRAVENOUS

## 2017-12-13 MED ORDER — FENTANYL CITRATE (PF) 250 MCG/5ML IJ SOLN
INTRAMUSCULAR | Status: AC
  Filled 2017-12-13: qty 5

## 2017-12-13 MED ORDER — FENTANYL CITRATE (PF) 100 MCG/2ML IJ SOLN
INTRAMUSCULAR | Status: AC
  Filled 2017-12-13: qty 2

## 2017-12-13 MED ORDER — PHENYLEPHRINE 40 MCG/ML (10ML) SYRINGE FOR IV PUSH (FOR BLOOD PRESSURE SUPPORT)
PREFILLED_SYRINGE | INTRAVENOUS | Status: AC
  Filled 2017-12-13: qty 20

## 2017-12-13 MED ORDER — SODIUM CHLORIDE 0.9 % IV SOLN
INTRAVENOUS | Status: DC
  Administered 2017-12-13 – 2017-12-14 (×2): via INTRAVENOUS

## 2017-12-13 MED ORDER — ONDANSETRON HCL 4 MG/2ML IJ SOLN
4.0000 mg | INTRAMUSCULAR | Status: DC | PRN
  Administered 2017-12-13: 4 mg via INTRAVENOUS
  Filled 2017-12-13: qty 2

## 2017-12-13 MED ORDER — LACTATED RINGERS IV SOLN
INTRAVENOUS | Status: DC
  Administered 2017-12-13 – 2017-12-14 (×3): via INTRAVENOUS

## 2017-12-13 MED ORDER — ROCURONIUM BROMIDE 50 MG/5ML IV SOSY
PREFILLED_SYRINGE | INTRAVENOUS | Status: DC | PRN
  Administered 2017-12-13: 10 mg via INTRAVENOUS
  Administered 2017-12-13: 30 mg via INTRAVENOUS
  Administered 2017-12-13: 5 mg via INTRAVENOUS
  Administered 2017-12-13: 10 mg via INTRAVENOUS

## 2017-12-13 MED ORDER — METOCLOPRAMIDE HCL 5 MG/ML IJ SOLN
INTRAMUSCULAR | Status: AC
  Administered 2017-12-13: 10 mg via INTRAVENOUS
  Filled 2017-12-13: qty 2

## 2017-12-13 MED ORDER — LIDOCAINE 2% (20 MG/ML) 5 ML SYRINGE
INTRAMUSCULAR | Status: DC | PRN
  Administered 2017-12-13: 30 mg via INTRAVENOUS

## 2017-12-13 MED ORDER — MIDAZOLAM HCL 2 MG/2ML IJ SOLN
INTRAMUSCULAR | Status: AC
  Filled 2017-12-13: qty 2

## 2017-12-13 MED ORDER — AMLODIPINE BESYLATE 10 MG PO TABS
10.0000 mg | ORAL_TABLET | Freq: Every day | ORAL | Status: DC
  Administered 2017-12-14 – 2017-12-15 (×2): 10 mg via ORAL
  Filled 2017-12-13 (×2): qty 1

## 2017-12-13 MED ORDER — SODIUM CHLORIDE 0.9 % IR SOLN
Status: DC | PRN
  Administered 2017-12-13: 57000 mL

## 2017-12-13 MED ORDER — ONDANSETRON HCL 4 MG/2ML IJ SOLN
INTRAMUSCULAR | Status: DC | PRN
  Administered 2017-12-13: 4 mg via INTRAVENOUS

## 2017-12-13 MED ORDER — SUGAMMADEX SODIUM 200 MG/2ML IV SOLN
INTRAVENOUS | Status: DC | PRN
  Administered 2017-12-13: 100 mg via INTRAVENOUS

## 2017-12-13 MED ORDER — DEXAMETHASONE SODIUM PHOSPHATE 10 MG/ML IJ SOLN
INTRAMUSCULAR | Status: DC | PRN
  Administered 2017-12-13: 5 mg via INTRAVENOUS

## 2017-12-13 MED ORDER — SENNA 8.6 MG PO TABS
1.0000 | ORAL_TABLET | Freq: Two times a day (BID) | ORAL | Status: DC
  Administered 2017-12-13 – 2017-12-15 (×4): 8.6 mg via ORAL
  Filled 2017-12-13 (×4): qty 1

## 2017-12-13 MED ORDER — ROCURONIUM BROMIDE 10 MG/ML (PF) SYRINGE
PREFILLED_SYRINGE | INTRAVENOUS | Status: AC
  Filled 2017-12-13: qty 5

## 2017-12-13 MED ORDER — METOCLOPRAMIDE HCL 5 MG/ML IJ SOLN
10.0000 mg | Freq: Once | INTRAMUSCULAR | Status: AC | PRN
  Administered 2017-12-13: 10 mg via INTRAVENOUS

## 2017-12-13 MED ORDER — OXYBUTYNIN CHLORIDE 5 MG PO TABS
5.0000 mg | ORAL_TABLET | Freq: Three times a day (TID) | ORAL | Status: DC | PRN
  Administered 2017-12-13: 5 mg via ORAL
  Filled 2017-12-13: qty 1

## 2017-12-13 MED ORDER — PROPOFOL 10 MG/ML IV BOLUS
INTRAVENOUS | Status: AC
  Filled 2017-12-13: qty 20

## 2017-12-13 MED ORDER — FENTANYL CITRATE (PF) 100 MCG/2ML IJ SOLN
INTRAMUSCULAR | Status: DC | PRN
  Administered 2017-12-13 (×6): 50 ug via INTRAVENOUS

## 2017-12-13 MED ORDER — HYDROMORPHONE HCL 1 MG/ML IJ SOLN
0.5000 mg | INTRAMUSCULAR | Status: DC | PRN
  Administered 2017-12-13 – 2017-12-14 (×3): 1 mg via INTRAVENOUS
  Filled 2017-12-13 (×3): qty 1

## 2017-12-13 MED ORDER — FERROUS SULFATE 325 (65 FE) MG PO TABS
325.0000 mg | ORAL_TABLET | Freq: Two times a day (BID) | ORAL | Status: DC
  Administered 2017-12-13 – 2017-12-15 (×4): 325 mg via ORAL
  Filled 2017-12-13 (×4): qty 1

## 2017-12-13 MED ORDER — LIDOCAINE 2% (20 MG/ML) 5 ML SYRINGE
INTRAMUSCULAR | Status: AC
  Filled 2017-12-13: qty 5

## 2017-12-13 MED ORDER — MEPERIDINE HCL 50 MG/ML IJ SOLN: 6.2500 mg | INTRAMUSCULAR | Status: DC | PRN

## 2017-12-13 MED ORDER — PROPOFOL 10 MG/ML IV BOLUS
INTRAVENOUS | Status: DC | PRN
  Administered 2017-12-13: 80 mg via INTRAVENOUS

## 2017-12-13 MED ORDER — 0.9 % SODIUM CHLORIDE (POUR BTL) OPTIME
TOPICAL | Status: DC | PRN
  Administered 2017-12-13: 1000 mL

## 2017-12-13 MED ORDER — BELLADONNA ALKALOIDS-OPIUM 16.2-60 MG RE SUPP
1.0000 | Freq: Four times a day (QID) | RECTAL | Status: DC | PRN
  Administered 2017-12-14: 1 via RECTAL
  Filled 2017-12-13: qty 1

## 2017-12-13 MED ORDER — OXYCODONE HCL 5 MG PO TABS
5.0000 mg | ORAL_TABLET | ORAL | Status: DC | PRN
  Administered 2017-12-14 – 2017-12-15 (×3): 5 mg via ORAL
  Filled 2017-12-13 (×3): qty 1

## 2017-12-13 MED ORDER — IOHEXOL 300 MG/ML  SOLN
INTRAMUSCULAR | Status: DC | PRN
  Administered 2017-12-13: 20 mL via URETHRAL

## 2017-12-13 MED ORDER — DEXAMETHASONE SODIUM PHOSPHATE 10 MG/ML IJ SOLN
INTRAMUSCULAR | Status: AC
  Filled 2017-12-13: qty 1

## 2017-12-13 MED ORDER — FENTANYL CITRATE (PF) 100 MCG/2ML IJ SOLN
25.0000 ug | INTRAMUSCULAR | Status: DC | PRN
  Administered 2017-12-13: 25 ug via INTRAVENOUS

## 2017-12-13 MED ORDER — PHENYLEPHRINE 40 MCG/ML (10ML) SYRINGE FOR IV PUSH (FOR BLOOD PRESSURE SUPPORT)
PREFILLED_SYRINGE | INTRAVENOUS | Status: DC | PRN
  Administered 2017-12-13 (×2): 80 ug via INTRAVENOUS
  Administered 2017-12-13: 120 ug via INTRAVENOUS
  Administered 2017-12-13: 80 ug via INTRAVENOUS
  Administered 2017-12-13: 120 ug via INTRAVENOUS
  Administered 2017-12-13 (×2): 80 ug via INTRAVENOUS

## 2017-12-13 SURGICAL SUPPLY — 30 items
BAG URINE DRAINAGE (UROLOGICAL SUPPLIES) IMPLANT
BAG URO CATCHER STRL LF (MISCELLANEOUS) ×3 IMPLANT
CATH FOLEY 2WAY SLVR  5CC 20FR (CATHETERS) ×1
CATH FOLEY 2WAY SLVR 5CC 20FR (CATHETERS) ×2 IMPLANT
CATH INTERMIT  6FR 70CM (CATHETERS) ×3 IMPLANT
CLOTH BEACON ORANGE TIMEOUT ST (SAFETY) ×3 IMPLANT
CONT SPEC 4OZ CLIKSEAL STRL BL (MISCELLANEOUS) ×3 IMPLANT
COVER FOOTSWITCH UNIV (MISCELLANEOUS) ×3 IMPLANT
COVER SURGICAL LIGHT HANDLE (MISCELLANEOUS) ×3 IMPLANT
ELECT REM PT RETURN 15FT ADLT (MISCELLANEOUS) ×3 IMPLANT
GLOVE BIOGEL M STRL SZ7.5 (GLOVE) ×6 IMPLANT
GLOVE BIOGEL PI IND STRL 7.5 (GLOVE) ×4 IMPLANT
GLOVE BIOGEL PI IND STRL 9 (GLOVE) ×2 IMPLANT
GLOVE BIOGEL PI INDICATOR 7.5 (GLOVE) ×2
GLOVE BIOGEL PI INDICATOR 9 (GLOVE) ×1
GLOVE ECLIPSE 8.5 STRL (GLOVE) ×3 IMPLANT
GOWN STRL REUS W/ TWL XL LVL3 (GOWN DISPOSABLE) ×4 IMPLANT
GOWN STRL REUS W/TWL 2XL LVL3 (GOWN DISPOSABLE) ×3 IMPLANT
GOWN STRL REUS W/TWL LRG LVL3 (GOWN DISPOSABLE) ×6 IMPLANT
GOWN STRL REUS W/TWL XL LVL3 (GOWN DISPOSABLE) ×2
GUIDEWIRE ANG ZIPWIRE 038X150 (WIRE) ×3 IMPLANT
GUIDEWIRE STR DUAL SENSOR (WIRE) ×3 IMPLANT
LOOP CUT BIPOLAR 24F LRG (ELECTROSURGICAL) ×6 IMPLANT
MANIFOLD NEPTUNE II (INSTRUMENTS) ×3 IMPLANT
PACK CYSTO (CUSTOM PROCEDURE TRAY) ×3 IMPLANT
SET ASPIRATION TUBING (TUBING) IMPLANT
STENT CONTOUR 6FRX24X.038 (STENTS) ×3 IMPLANT
SYRINGE IRR TOOMEY STRL 70CC (SYRINGE) ×3 IMPLANT
TUBING CONNECTING 10 (TUBING) ×3 IMPLANT
TUBING UROLOGY SET (TUBING) ×3 IMPLANT

## 2017-12-13 NOTE — H&P (Signed)
CC/HPI: CC: Large bladder mass, bilateral hydronephrosis  HPI: 62 year old female who had about a 30 pound unexplained weight loss. She is a smoker. She underwent a CT of the abdomen and pelvis without contrast on 11/12/2017 that revealed a 6 cm bladder tumor. She also had mild bilateral hydronephrosis. Recent creatinine value was 1.3. GFR was about 50. Patient admits to gross hematuria with occasional clots. She also has some voiding complaints associated with this. This has been going on for quite some time. There was no definite lymphadenopathy seen but she had no IV contrast.     ALLERGIES: No Known Drug Allergies    MEDICATIONS: Amlodipine Besylate 10 mg tablet  Aspir 81  Diclofenac Sodium 75 mg tablet, delayed release  Iron  Vitamin D2     GU PSH: None   NON-GU PSH: None   GU PMH: None   NON-GU PMH: Hypertension    FAMILY HISTORY: 1 Daughter - Other Diabetes - Father HIV Infection - Brother Hypertension - Mother   SOCIAL HISTORY: Marital Status: Single Preferred Language: English; Race: Black or African American Current Smoking Status: Patient smokes.   Tobacco Use Assessment Completed: Used Tobacco in last 30 days? Does not drink caffeine.    REVIEW OF SYSTEMS:    GU Review Female:   Patient reports frequent urination, hard to postpone urination, burning /pain with urination, get up at night to urinate, and stream starts and stops. Patient denies leakage of urine, trouble starting your stream, have to strain to urinate, and being pregnant.  Gastrointestinal (Upper):   Patient denies nausea, vomiting, and indigestion/ heartburn.  Gastrointestinal (Lower):   Patient denies diarrhea and constipation.  Constitutional:   Patient reports night sweats and weight loss. Patient denies fever and fatigue.  Skin:   Patient denies skin rash/ lesion and itching.  Eyes:   Patient denies blurred vision and double vision.  Ears/ Nose/ Throat:   Patient denies sore throat and sinus  problems.  Hematologic/Lymphatic:   Patient denies swollen glands and easy bruising.  Cardiovascular:   Patient denies leg swelling and chest pains.  Respiratory:   Patient reports cough and shortness of breath.   Endocrine:   Patient denies excessive thirst.  Musculoskeletal:   Patient reports back pain and joint pain.   Neurological:   Patient reports dizziness. Patient denies headaches.  Psychologic:   Patient denies depression and anxiety.   Notes: blood in urine UTI Vaginal Bleeding    VITAL SIGNS:      11/24/2017 11:54 AM  Weight 74 lb / 33.57 kg  Height 65 in / 165.1 cm  BP 141/90 mmHg  Pulse 114 /min  Temperature 98.0 F / 36.6 C  BMI 12.3 kg/m   MULTI-SYSTEM PHYSICAL EXAMINATION:    Constitutional: Patient is cachectic and in a wheelchair  Respiratory: No labored breathing, no use of accessory muscles.  Cardiovascular: Normal temperature, adequate perfusion of extremities  Skin: No paleness, no jaundice  Neurologic / Psychiatric: Oriented to time, oriented to place, oriented to person. No depression, no anxiety, no agitation.  Gastrointestinal: No mass, no tenderness, no rigidity, non obese abdomen.  Eyes: Normal conjunctivae. Normal eyelids.  Musculoskeletal: Normal gait and station of head and neck.     PAST DATA REVIEWED:  Source Of History:  Patient  Records Review:   Previous Patient Records  X-Ray Review: C.T. Abdomen/Pelvis: Reviewed Films. Reviewed Report. Discussed With Patient.     PROCEDURES:          Urinalysis w/Scope -  81001         Urinalysis Dipstick Dipstick Cont'd Micro  Color: Red Bilirubin: Invalid WBC/hpf: 10 - 20/hpf  Appearance: Turbid Ketones: Invalid RBC/hpf: Packed/hpf  Specific Gravity: Invalid Blood: Invalid Bacteria: Rare (0-9/hpf)  pH: Invalid Protein: Invalid Cystals: NS (Not Seen)  Glucose: Invalid Urobilinogen: Invalid Casts: NS (Not Seen)    Nitrites: Invalid Trichomonas: Not Present    Leukocyte Esterase: Invalid Mucous:  Not Present      Epithelial Cells: 0 - 5/hpf      Yeast: NS (Not Seen)      Sperm: Not Present    ASSESSMENT:      ICD-10 Details  1 GU:   Bladder Cancer Lateral - C67.2    PLAN:           Orders Labs Urine Culture          Schedule Return Visit/Planned Activity: ASAP - Schedule Surgery          Document Letter(s):  Created for Patient: Clinical Summary         Notes:   Proceed with transurethral resection of bladder tumor. She understands potential risks including but not limited to bleeding, infection, injury to surrounding structures including possible bladder perforation. Given the bilateral hydronephrosis, also plan to perform bilateral retrograde pyelogram with possible ureteral stent placement if I can find the ureteral orifices. After stent placement, I'll plan to perform a repeat CT scan with IV contrast to fully evaluate for lymphadenopathy.        Next Appointment:      Next Appointment: 12/13/2017 10:30 AM    Appointment Type: Surgery     Location: [facility] [phone]    Provider: Link Snuffer, III, M.D.    Reason for Visit: OP WL TURBT BIL RPG STENT      Signed by Link Snuffer, III, M.D. on 11/28/17 at 6:58 PM

## 2017-12-13 NOTE — Transfer of Care (Signed)
Immediate Anesthesia Transfer of Care Note  Patient: Donna Curry  Procedure(s) Performed: TRANSURETHRAL RESECTION OF BLADDER TUMOR (TURBT) (N/A ) CYSTOSCOPY WITH RIGHT RETROGRADE PYELOGRAM/URETERAL RIGHT STENT PLACEMENT (Right )  Patient Location: PACU  Anesthesia Type:General  Level of Consciousness: drowsy and patient cooperative  Airway & Oxygen Therapy: Patient Spontanous Breathing and Patient connected to face mask oxygen  Post-op Assessment: Report given to RN and Post -op Vital signs reviewed and stable  Post vital signs: Reviewed and stable  Last Vitals:  Vitals Value Taken Time  BP 167/86 12/13/2017  2:15 PM  Temp    Pulse 98 12/13/2017  2:16 PM  Resp 15 12/13/2017  2:16 PM  SpO2 100 % 12/13/2017  2:16 PM  Vitals shown include unvalidated device data.  Last Pain:  Vitals:   12/13/17 0844  TempSrc:   PainSc: 0-No pain         Complications: No apparent anesthesia complications

## 2017-12-13 NOTE — Anesthesia Procedure Notes (Signed)
Procedure Name: Intubation Date/Time: 12/13/2017 10:47 AM Performed by: Montel Clock, CRNA Pre-anesthesia Checklist: Patient identified, Emergency Drugs available, Suction available, Patient being monitored and Timeout performed Patient Re-evaluated:Patient Re-evaluated prior to induction Oxygen Delivery Method: Circle system utilized Preoxygenation: Pre-oxygenation with 100% oxygen Induction Type: IV induction Ventilation: Mask ventilation without difficulty and Oral airway inserted - appropriate to patient size Laryngoscope Size: Mac and 3 Grade View: Grade I Tube type: Oral Tube size: 7.0 mm Number of attempts: 1 Airway Equipment and Method: Stylet Placement Confirmation: ETT inserted through vocal cords under direct vision,  positive ETCO2 and breath sounds checked- equal and bilateral Secured at: 21 cm Tube secured with: Tape Dental Injury: Teeth and Oropharynx as per pre-operative assessment

## 2017-12-13 NOTE — Op Note (Signed)
Operative Note  Preoperative diagnosis:  1.  Bladder tumor-large- greater than 5 cm  Postoperative diagnosis: 1.  Bladder tumor-large-greater than 8 cm  Procedure(s): 1.  Cystoscopy with transurethral resection of bladder tumor-large 2.  Right retrograde pyelogram with ureteral stent placement  Surgeon: Link Snuffer, MD  Assistants: None  Anesthesia: General  Complications: None immediate  EBL: Minimal  Specimens: 1.  Bladder tumor  Drains/Catheters: 1.  20 French Foley catheter 2..  6 x 24 double-J ureteral stent on the right  Intraoperative findings: Urethra.  Very large bladder tumor encompassing the right lateral wall extending over to the anterior role all the way to the left.  The left ureteral orifice was visible and identified but a wire would not advance and a retrograde pyelogram on the left indicated possible false passage formation and a wire could not be passed up.  Right retrograde pyelogram revealed mild hydronephrosis.  Accessible placement of right ureteral stent  Indication: 62 year old female with a very large bladder tumor and bilateral hydronephrosis as well as renal insufficiency presents for the previously mentioned operation.  Her starting hemoglobin was 8.9.  Description of procedure:  The patient was identified and consent was obtained.  The patient was taken to the operating room and placed in the supine position.  The patient was placed under general anesthesia.  Perioperative antibiotics were administered.  The patient was placed in dorsal lithotomy.  Patient was prepped and draped in a standard sterile fashion and a timeout was performed.  A 21 French rigid cystoscope was advanced into the urethra and into the bladder.  Complete cystoscopy was performed with the findings noted above.  I tried to cannulate the left ureter with a sensor wire and this met some resistance.  I tried to advance an open-ended ureteral catheter into the distal ureter and she  had a retrograde pyelogram and there was extravasation indicating possible false passage formation from the sensor wire.  This was therefore abandoned after trying a Glidewire that also did not pass up.  There is a little bit bloody at this point and therefore I exchanged the scope for a 26 French resectoscope.  I spent a great deal of time resecting the mass.  I resected the majority of the mass down to either muscle fiber or deep tumor tissue.  I fulgurated all areas of bleeding.  There was some small amount of oozing and I was able to fulgurate the majority of this to obtain hemostasis.  I then removed all bladder chips.  I exchanged the scope for a 21 French cystoscope.  I advanced a wire up the right ureter followed by advancement of an open-ended ureteral catheter over this.  I remove the wire and shot a retrograde pyelogram with the findings noted above.  I then advanced a wire up to the kidney and placed a 6 x 24 double-J ureteral stent in a standard fashion followed by removal of the wire.  Fluoroscopy confirmed proximal placement and direct visualization confirmed a good coil within the bladder.  I once more tried to advance a sensor wire and Glidewire up the left side but this was still unsuccessful.  I withdrew the scope and placed a 20 French urethral catheter.  Patient tolerated the procedure well and was stable postoperatively.  Plan: Stat labs.  The patient did receive 1 unit of packed red blood cells in the operating room.  I will keep her overnight.  Obtain renal ultrasound.  If there is still hydronephrosis on the  left, I will talk to her about antegrade ureteral stent placement versus nephrostomy tube.

## 2017-12-13 NOTE — Anesthesia Preprocedure Evaluation (Addendum)
Anesthesia Evaluation  Patient identified by MRN, date of birth, ID band Patient awake    Reviewed: Allergy & Precautions, NPO status , Patient's Chart, lab work & pertinent test results  Airway Mallampati: II  TM Distance: >3 FB Neck ROM: Full    Dental  (+) Edentulous Upper, Edentulous Lower   Pulmonary Current Smoker,    Pulmonary exam normal breath sounds clear to auscultation       Cardiovascular hypertension, Pt. on medications + Peripheral Vascular Disease  Normal cardiovascular exam Rhythm:Regular Rate:Normal     Neuro/Psych TIAnegative psych ROS   GI/Hepatic negative GI ROS, Neg liver ROS,   Endo/Other  negative endocrine ROS  Renal/GU negative Renal ROS     Musculoskeletal  (+) Arthritis ,   Abdominal   Peds  Hematology negative hematology ROS (+) anemia ,   Anesthesia Other Findings   Reproductive/Obstetrics negative OB ROS                            Anesthesia Physical Anesthesia Plan  ASA: II  Anesthesia Plan: General   Post-op Pain Management:    Induction: Intravenous  PONV Risk Score and Plan: 3 and Ondansetron, Dexamethasone and Treatment may vary due to age or medical condition  Airway Management Planned: Oral ETT  Additional Equipment:   Intra-op Plan:   Post-operative Plan: Extubation in OR  Informed Consent: I have reviewed the patients History and Physical, chart, labs and discussed the procedure including the risks, benefits and alternatives for the proposed anesthesia with the patient or authorized representative who has indicated his/her understanding and acceptance.   Dental advisory given  Plan Discussed with: CRNA  Anesthesia Plan Comments:         Anesthesia Quick Evaluation

## 2017-12-13 NOTE — Interval H&P Note (Signed)
History and Physical Interval Note:  12/13/2017 9:32 AM  Donna Curry  has presented today for surgery, with the diagnosis of BLADDER MASS  BILATERAL HYDRONEPHROSIS  The various methods of treatment have been discussed with the patient and family. After consideration of risks, benefits and other options for treatment, the patient has consented to  Procedure(s): TRANSURETHRAL RESECTION OF BLADDER TUMOR (TURBT) (N/A) CYSTOSCOPY WITH BILATERAL RETROGRADE PYELOGRAM/URETERAL STENT PLACEMENT (Bilateral) as a surgical intervention .  The patient's history has been reviewed, patient examined, no change in status, stable for surgery.  I have reviewed the patient's chart and labs.  Questions were answered to the patient's satisfaction.     Marton Redwood, III

## 2017-12-14 DIAGNOSIS — C672 Malignant neoplasm of lateral wall of bladder: Secondary | ICD-10-CM | POA: Diagnosis not present

## 2017-12-14 LAB — BASIC METABOLIC PANEL
Anion gap: 11 (ref 5–15)
BUN: 11 mg/dL (ref 6–20)
CO2: 23 mmol/L (ref 22–32)
Calcium: 8.7 mg/dL — ABNORMAL LOW (ref 8.9–10.3)
Chloride: 106 mmol/L (ref 101–111)
Creatinine, Ser: 0.95 mg/dL (ref 0.44–1.00)
GFR calc Af Amer: >60 mL/min (ref >60)
GFR calc non Af Amer: >60 mL/min (ref >60)
Glucose, Bld: 95 mg/dL (ref 65–99)
Potassium: 4.3 mmol/L (ref 3.5–5.1)
Sodium: 140 mmol/L (ref 135–145)

## 2017-12-14 LAB — BPAM RBC
Blood Product Expiration Date: 201906062359
ISSUE DATE / TIME: 201905221203
Unit Type and Rh: 6200

## 2017-12-14 LAB — TYPE AND SCREEN
ABO/RH(D): A POS
Antibody Screen: NEGATIVE
Unit division: 0

## 2017-12-14 LAB — HEMOGLOBIN AND HEMATOCRIT, BLOOD
HCT: 29.5 % — ABNORMAL LOW (ref 36.0–46.0)
Hemoglobin: 9.3 g/dL — ABNORMAL LOW (ref 12.0–15.0)

## 2017-12-14 MED ORDER — DOCUSATE SODIUM 100 MG PO CAPS: 100.0000 mg | ORAL_CAPSULE | Freq: Two times a day (BID) | ORAL | 0 refills | Status: DC

## 2017-12-14 MED ORDER — ONDANSETRON HCL 4 MG PO TABS: 4.0000 mg | ORAL_TABLET | Freq: Every day | ORAL | 1 refills | Status: DC | PRN

## 2017-12-14 MED ORDER — OXYCODONE HCL 5 MG PO TABS: 5.0000 mg | ORAL_TABLET | ORAL | 0 refills | Status: DC | PRN

## 2017-12-14 MED ORDER — OXYBUTYNIN CHLORIDE 5 MG PO TABS: 5.0000 mg | ORAL_TABLET | Freq: Three times a day (TID) | ORAL | 3 refills | Status: DC | PRN

## 2017-12-14 NOTE — Discharge Summary (Signed)
Physician Discharge Summary  Patient ID: Donna Curry MRN: [id number] DOB/AGE: [id number] 62 y.o.  Admit date: 12/13/2017 Discharge date: 12/14/2017  Admission Diagnoses:  Discharge Diagnoses:  Active Problems:   Bladder cancer [facility])   Discharged Condition: good  Hospital Course: 62 year old female admitted for TURBT-large and right ureteral stent placement.  I was unable to place a left ureteral stent retrograde..  She had 1 unit of blood in the operating room.  She remained overnight for observation.  She had a renal ultrasound performed which showed right-sided moderate hydronephrosis but the stent was in place.  Also revealed mild left hydronephrosis.  Her creatinine was 0.95.  She had no left flank pain.  Therefore decision was made to observe the left side.  Urine was light pink this morning and she was doing well and therefore the decision was made to discharge patient home in stable condition.  Consults: None  Significant Diagnostic Studies: None  Treatments: surgery: As above  Discharge Exam: Blood pressure (!) 155/96, pulse 81, temperature 99.1 F (37.3 C), temperature source Oral, resp. rate 18, height 5\' 4"  (1.626 m), weight 35.2 kg (77 lb 9.6 oz), SpO2 94 %. General appearance: alert no acute distress Adequate peripheral perfusion of extremities Unlabored respirations Abdomen soft nontender nondistended Catheter draining light pink urine  Disposition: Discharge disposition: 01-Home or Self Care        Allergies as of 12/14/2017   No Known Allergies     Medication List    STOP taking these medications   aspirin 81 MG EC tablet     TAKE these medications   acetaminophen 500 MG tablet Commonly known as:  TYLENOL Take 1,000 mg by mouth every 8 (eight) hours as needed for moderate pain.   amLODipine 10 MG tablet Commonly known as:  NORVASC Take 10 mg by mouth daily.   atorvastatin 80 MG tablet Commonly known as:  LIPITOR Take 1 tablet (80 mg  total) by mouth daily at 6 PM.   ciprofloxacin 250 MG tablet Commonly known as:  CIPRO Take 1 tablet (250 mg total) by mouth 2 (two) times daily.   diclofenac 75 MG EC tablet Commonly known as:  VOLTAREN Take 1 tablet (75 mg total) by mouth 2 (two) times daily.   docusate sodium 100 MG capsule Commonly known as:  COLACE Take 1 capsule (100 mg total) by mouth 2 (two) times daily.   ferrous sulfate 325 (65 FE) MG tablet Take 1 tablet (325 mg total) by mouth 2 (two) times daily with a meal.   nicotine 21 mg/24hr patch Commonly known as:  NICODERM CQ - dosed in mg/24 hours Place 1 patch (21 mg total) onto the skin daily.   ondansetron 4 MG tablet Commonly known as:  ZOFRAN Take 1 tablet (4 mg total) by mouth daily as needed for nausea or vomiting.   oxybutynin 5 MG tablet Commonly known as:  DITROPAN Take 1 tablet (5 mg total) by mouth every 8 (eight) hours as needed for bladder spasms.   oxyCODONE 5 MG immediate release tablet Commonly known as:  Oxy IR/ROXICODONE Take 1 tablet (5 mg total) by mouth every 4 (four) hours as needed for moderate pain.   phenazopyridine 200 MG tablet Commonly known as:  PYRIDIUM Take 1 tablet (200 mg total) by mouth 3 (three) times daily as needed for pain.   Vitamin D (Ergocalciferol) 50000 units Caps capsule Commonly known as:  DRISDOL Take 1 capsule (50,000 Units total) by mouth every 7 (seven) days.  Signed: Marton Redwood, III 12/14/2017, 8:12 AM

## 2017-12-14 NOTE — Anesthesia Postprocedure Evaluation (Signed)
Anesthesia Post Note  Patient: Donna Curry  Procedure(s) Performed: TRANSURETHRAL RESECTION OF BLADDER TUMOR (TURBT) (N/A ) CYSTOSCOPY WITH RIGHT RETROGRADE PYELOGRAM/URETERAL RIGHT STENT PLACEMENT (Right )     Patient location during evaluation: PACU Anesthesia Type: General Level of consciousness: sedated and patient cooperative Pain management: pain level controlled Vital Signs Assessment: post-procedure vital signs reviewed and stable Respiratory status: spontaneous breathing Cardiovascular status: stable Anesthetic complications: no    Last Vitals:  Vitals:   12/13/17 2017 12/14/17 0459  BP: 132/82 (!) 155/96  Pulse: 82 81  Resp: 18 18  Temp: 37.3 C 37.3 C  SpO2: 100% 94%    Last Pain:  Vitals:   12/14/17 0520  TempSrc:   PainSc: [place]

## 2017-12-14 NOTE — Progress Notes (Signed)
12/14/17  1800 Paged Dr Gloriann Loan [phone] twice to check if patient need to be discharge with foley or if it can be removed. Called office [phone]  Was told Dr Gloriann Loan is here at [facility] in surgery. Still waiting on MD response.

## 2017-12-14 NOTE — Plan of Care (Signed)
  Problem: Pain Managment: Goal: General experience of comfort will improve Outcome: Progressing   Problem: Safety: Goal: Ability to remain free from injury will improve Outcome: Progressing   Problem: Nutrition: Goal: Adequate nutrition will be maintained Outcome: Progressing   

## 2017-12-14 NOTE — Plan of Care (Signed)

## 2017-12-15 DIAGNOSIS — C672 Malignant neoplasm of lateral wall of bladder: Secondary | ICD-10-CM | POA: Diagnosis not present

## 2017-12-15 NOTE — Progress Notes (Signed)
Pt to be discharged home with [facility] per order. Discharge paperwork explained to pt and family who verbalized understanding. Printed prescriptions given to patient and pt aware of where to pick up other prescriptions. [facility] teaching completed including how/when to use leg bag, when to call MD, how to empty foley bag, how to use leg strap. Pt and family verbalized understanding and stated they felt comfortable caring for FC. Pain under control with PO medications. Pt to be assisted to family's vehicle by W/C with help from nursing staff.

## 2017-12-28 ENCOUNTER — Other Ambulatory Visit: Payer: Self-pay

## 2017-12-28 ENCOUNTER — Inpatient Hospital Stay: Payer: Medicaid Other | Attending: Hematology & Oncology | Admitting: Hematology & Oncology

## 2017-12-28 ENCOUNTER — Inpatient Hospital Stay: Payer: Medicaid Other

## 2017-12-28 VITALS — BP 165/93 | HR 97 | Temp 98.2°F | Resp 18 | Wt 71.0 lb

## 2017-12-28 DIAGNOSIS — C670 Malignant neoplasm of trigone of bladder: Secondary | ICD-10-CM

## 2017-12-28 DIAGNOSIS — Z800 Family history of malignant neoplasm of digestive organs: Secondary | ICD-10-CM

## 2017-12-28 DIAGNOSIS — F17210 Nicotine dependence, cigarettes, uncomplicated: Secondary | ICD-10-CM | POA: Diagnosis not present

## 2017-12-28 DIAGNOSIS — R634 Abnormal weight loss: Secondary | ICD-10-CM | POA: Diagnosis not present

## 2017-12-28 DIAGNOSIS — I10 Essential (primary) hypertension: Secondary | ICD-10-CM

## 2017-12-28 DIAGNOSIS — C679 Malignant neoplasm of bladder, unspecified: Secondary | ICD-10-CM | POA: Diagnosis not present

## 2017-12-28 LAB — CMP (CANCER CENTER ONLY)
ALT: 15 U/L (ref 0–55)
AST: 20 U/L (ref 5–34)
Albumin: 4 g/dL (ref 3.5–5.0)
Alkaline Phosphatase: 108 U/L (ref 40–150)
Anion gap: 14 — ABNORMAL HIGH (ref 3–11)
BUN: 10 mg/dL (ref 7–26)
CO2: 28 mmol/L (ref 22–29)
Calcium: 10.4 mg/dL (ref 8.4–10.4)
Chloride: 104 mmol/L (ref 98–109)
Creatinine: 1.02 mg/dL (ref 0.60–1.10)
GFR, Est AFR Am: >60 mL/min (ref >60)
GFR, Estimated: 58 mL/min — ABNORMAL LOW (ref >60)
Glucose, Bld: 91 mg/dL (ref 70–140)
Potassium: 4.5 mmol/L (ref 3.5–5.1)
Sodium: 146 mmol/L — ABNORMAL HIGH (ref 136–145)
Total Bilirubin: 0.2 mg/dL (ref 0.2–1.2)
Total Protein: 8.1 g/dL (ref 6.4–8.3)

## 2017-12-28 LAB — CBC WITH DIFFERENTIAL (CANCER CENTER ONLY)
Basophils Absolute: 0 10*3/uL (ref 0.0–0.1)
Basophils Relative: 0 %
Eosinophils Absolute: 0.1 10*3/uL (ref 0.0–0.5)
Eosinophils Relative: 2 %
HCT: 40.1 % (ref 34.8–46.6)
Hemoglobin: 12.6 g/dL (ref 11.6–15.9)
Lymphocytes Relative: 18 %
Lymphs Abs: 1.1 10*3/uL (ref 0.9–3.3)
MCH: 28.8 pg (ref 26.0–34.0)
MCHC: 31.4 g/dL — ABNORMAL LOW (ref 32.0–36.0)
MCV: 91.8 fL (ref 81.0–101.0)
Monocytes Absolute: 0.6 10*3/uL (ref 0.1–0.9)
Monocytes Relative: 10 %
Neutro Abs: 4.1 10*3/uL (ref 1.5–6.5)
Neutrophils Relative %: 70 %
Platelet Count: 304 10*3/uL (ref 145–400)
RBC: 4.37 MIL/uL (ref 3.70–5.32)
RDW: 19 % — ABNORMAL HIGH (ref 11.1–15.7)
WBC Count: 5.9 10*3/uL (ref 3.9–10.0)

## 2017-12-28 LAB — LACTATE DEHYDROGENASE: LDH: 240 U/L (ref 125–245)

## 2017-12-28 MED ORDER — MEGESTROL ACETATE 625 MG/5ML PO SUSP: 625.0000 mg | Freq: Every day | ORAL | 3 refills | Status: DC

## 2017-12-28 MED ORDER — OXANDROLONE 10 MG PO TABS: 10.0000 mg | ORAL_TABLET | Freq: Every day | ORAL | 3 refills | Status: DC

## 2017-12-28 MED ORDER — DRONABINOL 2.5 MG PO CAPS: 2.5000 mg | ORAL_CAPSULE | Freq: Two times a day (BID) | ORAL | 0 refills | Status: DC

## 2017-12-28 NOTE — Progress Notes (Signed)
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 New Patient (Initial Visit)  : I have a tumor in my bladder.  HPI: Donna Curry is a very charming 62 year old African-American female.  I must say that she is quite tiny.  She will he weighs 71 pounds.  She has lost about 20 pounds over the past several months.  She began to have some hematuria.  She was having some pain in the bladder area.  She was not eating much.  She is having no nausea or vomiting.  She was having no obvious bowel issues.  There is no leg swelling.  She has been smoking for about 45 years.  She says she smokes less than half a pack per day.  She went to see her family doctor.  She had a CT scan done.  This was in April.  Specifically, this was on April 21.  The CT scan showed a large mass on the right side of the bladder measuring 5.5 x 5.8 x 4.3 cm.  There was mild bilateral hydronephrosis.  There is no obvious adenopathy.  Liver looked fine.  She had no obvious disease in her lungs.  She was then referred to urology.  She saw [name].  He went ahead and did a procedure.  He did a cystoscopy.  He did a TUR- B.  This was done on May 22.  He found that there was a large bladder tumor encompassing the right lateral wall.  It extended over the anterior wall to the left.  The left ureteral orifice was visible.  However, a wire could not be advanced.  There was mild right hydronephrosis.  A stent was placed.  He resected as much of the tumor as possible.  He did some fulguration.  She had a Foley catheter that was placed.  This was removed recently.  The pathology report ([id number]) showed infiltrating high-grade urothelial carcinoma with 10% squamous and fibrous and glandular differentiation.  It invaded the detrusor muscle.  There was lymphovascular invasion.  She was then referred to the [place] [facility] for further evaluation.  The real problem is her weight.  She weighs only 71 pounds.  She is in good spirits.  She comes in with her sister.  They are both very nice.  There is a history of colon cancer prostate cancer in the family.  There is no sickle cell in the family.  She says that she eats.  She just cannot gain weight.  Overall, her performance status is ECOG 1.    Past Medical History:  Diagnosis Date  . Anemia   . Family history of adverse reaction to anesthesia    brother wakes up and dont know who he is and sister has n/v  . History of kidney stones   . Hypertension   . Measles as a child  . Mumps as a child  . TIA (transient ischemic attack)   . Tobacco abuse   :  Past Surgical History:  Procedure Laterality Date  . CYSTOSCOPY W/ URETERAL STENT PLACEMENT Right 12/13/2017   Procedure: CYSTOSCOPY WITH RIGHT RETROGRADE PYELOGRAM/URETERAL RIGHT STENT PLACEMENT;  Surgeon: [name], MD;  Location: [facility];  Service: Urology;  Laterality: Right;  . TRANSURETHRAL RESECTION OF BLADDER TUMOR N/A 12/13/2017   Procedure: TRANSURETHRAL RESECTION OF BLADDER TUMOR (TURBT);  Surgeon: [name], MD;  Location:  [facility];  Service: Urology;  Laterality: N/A;  . TRANSURETHRAL RESECTION OF BLADDER TUMOR WITH GYRUS (TURBT-GYRUS)     [name] 12-13-17  . tubes tied    . uterine ablation     about 2005  :   Current Outpatient Medications:  .  acetaminophen (TYLENOL) 500 MG tablet, Take 1,000 mg by mouth every 8 (eight) hours as needed for moderate pain., Disp: , Rfl:  .  amLODipine (NORVASC) 10 MG tablet, Take 10 mg by mouth daily., Disp: , Rfl:  .  diclofenac (VOLTAREN) 75 MG EC tablet, Take 1 tablet (75 mg total) by mouth 2 (two) times daily. (Patient not taking: Reported on 12/08/2017), Disp: 14 tablet, Rfl: 0 .  docusate sodium (COLACE) 100 MG capsule, Take 1 capsule (100 mg total) by mouth 2 (two) times daily., Disp: 10 capsule, Rfl: 0 .  dronabinol  (MARINOL) 2.5 MG capsule, Take 1 capsule (2.5 mg total) by mouth 2 (two) times daily before a meal., Disp: 60 capsule, Rfl: 0 .  ferrous sulfate 325 (65 FE) MG tablet, Take 1 tablet (325 mg total) by mouth 2 (two) times daily with a meal., Disp: 60 tablet, Rfl: 3 .  megestrol (MEGACE ES) 625 MG/5ML suspension, Take 5 mLs (625 mg total) by mouth daily., Disp: 150 mL, Rfl: 3 .  nicotine (NICODERM CQ - DOSED IN MG/24 HOURS) 21 mg/24hr patch, Place 1 patch (21 mg total) onto the skin daily., Disp: 28 patch, Rfl: 0 .  ondansetron (ZOFRAN) 4 MG tablet, Take 1 tablet (4 mg total) by mouth daily as needed for nausea or vomiting., Disp: 30 tablet, Rfl: 1 .  oxandrolone (OXANDRIN) 10 MG tablet, Take 1 tablet (10 mg total) by mouth daily., Disp: 30 tablet, Rfl: 3 .  Vitamin D, Ergocalciferol, (DRISDOL) 50000 units CAPS capsule, Take 1 capsule (50,000 Units total) by mouth every 7 (seven) days., Disp: 8 capsule, Rfl: 0:  :  No Known Allergies:  Family History  Problem Relation Age of Onset  . Hypertension Mother   . Diabetes Father   . Hypertension Brother   . HIV Brother   :  Social History   Socioeconomic History  . Marital status: Single    Spouse name: Not on file  . Number of children: Not on file  . Years of education: Not on file  . Highest education level: Not on file  Occupational History  . Not on file  Social Needs  . Financial resource strain: Not on file  . Food insecurity:    Worry: Not on file    Inability: Not on file  . Transportation needs:    Medical: Not on file    Non-medical: Not on file  Tobacco Use  . Smoking status: Current Some Day Smoker    Packs/day: 0.50    Types: Cigarettes    Last attempt to quit: 03/22/2013    Years since quitting: 4.7  . Smokeless tobacco: Never Used  . Tobacco comment: 1/4 pack per day,Hasnt smoked in 8 days  Substance and Sexual Activity  . Alcohol use: No    Alcohol/week: 0.0 oz  . Drug use: Yes    Comment: marijuana denies at  preop  . Sexual activity: Not Currently  Lifestyle  . Physical activity:    Days per week: Not on file    Minutes per session: Not on file  . Stress: Not on file  Relationships  . Social connections:    Talks on phone: Not on file  Gets together: Not on file    Attends religious service: Not on file    Active member of club or organization: Not on file    Attends meetings of clubs or organizations: Not on file    Relationship status: Not on file  . Intimate partner violence:    Fear of current or ex partner: Not on file    Emotionally abused: Not on file    Physically abused: Not on file    Forced sexual activity: Not on file  Other Topics Concern  . Not on file  Social History Narrative   Denies hx of drug use   Single   1 daughter age 18 lives with daughter and grandson who is 27.   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  :  Review of Systems  Constitutional: Positive for weight loss.  HENT: Negative.   Eyes: Negative.   Respiratory: Negative.   Cardiovascular: Negative.   Gastrointestinal: Positive for abdominal pain.  Genitourinary: Positive for frequency and hematuria.  Musculoskeletal: Positive for back pain and myalgias.  Skin: Negative.   Neurological: Positive for focal weakness and headaches.  Endo/Heme/Allergies: Negative.   Psychiatric/Behavioral: Negative.      Exam: Very petite African-American female in no obvious distress.  Vital signs show temperature of 98.2.  Pulse 97.  Blood pressure 165/93.  Weight is 71 pounds.  Head neck exam shows no ocular or oral lesions.  There are no palpable cervical or supra clavicular lymph nodes.  Lungs are clear bilaterally.  Cardiac exam regular rate and rhythm with no murmurs, rubs or bruits.  Abdomen is soft.  She has good bowel sounds.  There is no fluid wave.  There is no obvious abdominal mass.  She has no inguinal adenopathy.  Back exam shows muscle atrophy in the paravertebral  muscles.  Extremities shows muscle atrophy in upper and lower extremities.  She has good range of motion of her joints.  Skin exam shows no rashes, ecchymoses or petechia.  Neurological exam shows no focal neurological deficits. @IPVITALS @   Recent Labs    12/28/17 1205  WBC 5.9  HGB 12.6  HCT 40.1  PLT 304   No results for input(s): NA, K, CL, CO2, GLUCOSE, BUN, CREATININE, CALCIUM in the last 72 hours.  Blood smear review:    Pathology: As above    Assessment and Plan: Donna Curry is a very charming 62 year old African-American female.  She has invasive bladder cancer.  This is a high-grade urothelial cancer.  [name] did a great job in trying to resect out as much as possible but this was technically impossible.  Hopefully, there is not any obvious metastatic disease.  I think that she will need a PET scan for [place] to make sure that there is no spread.  I believe that if there is no evidence of metastatic disease, radiation therapy and low-dose weekly chemotherapy would be the best way to treat her.  I think by doing radiation and chemotherapy together, we could achieve a good result.  I spent about an hour with she and her sister.  I spent with over half the time counseling them and informing them of the recommendations that we have.  I answered all of their questions.  We will see about trying to get the PET scan set up for next week.  If she needs radiation therapy, we will have it done at [facility].  I will  plan to have her come back depending on what we find with her PET scan.  I will call in some Megace, some Marinol, and some Oxandrin to try to help get her weight up.  I did give Donna Curry a prayer blanket which she was very thankful for.

## 2017-12-29 ENCOUNTER — Encounter: Payer: Self-pay | Admitting: Hematology & Oncology

## 2017-12-29 LAB — IRON AND TIBC
Iron: 42 ug/dL (ref 41–142)
Saturation Ratios: 13 % — ABNORMAL LOW (ref 21–57)
TIBC: 334 ug/dL (ref 236–444)
UIBC: 292 ug/dL

## 2017-12-29 LAB — FERRITIN: Ferritin: 54 ng/mL (ref 9–269)

## 2018-01-04 ENCOUNTER — Telehealth (HOSPITAL_COMMUNITY): Payer: Self-pay | Admitting: Hematology & Oncology

## 2018-01-05 ENCOUNTER — Ambulatory Visit (HOSPITAL_COMMUNITY): Payer: Medicaid Other

## 2018-01-09 ENCOUNTER — Encounter: Payer: Self-pay | Admitting: Medical

## 2018-01-11 ENCOUNTER — Encounter (HOSPITAL_COMMUNITY)
Admission: RE | Admit: 2018-01-11 | Discharge: 2018-01-11 | Disposition: A | Discharge status: Home / Self Care | Payer: Medicaid Other | Source: Ambulatory Visit | Attending: Hematology & Oncology | Admitting: Hematology & Oncology

## 2018-01-11 DIAGNOSIS — C670 Malignant neoplasm of trigone of bladder: Secondary | ICD-10-CM

## 2018-01-11 LAB — GLUCOSE, CAPILLARY: Glucose-Capillary: 93 mg/dL (ref 65–99)

## 2018-01-11 MED ORDER — FLUDEOXYGLUCOSE F - 18 (FDG) INJECTION
5.2000 | Freq: Once | INTRAVENOUS | Status: AC | PRN
  Administered 2018-01-11: 5.2 via INTRAVENOUS

## 2018-01-15 ENCOUNTER — Other Ambulatory Visit: Payer: Self-pay | Admitting: Hematology & Oncology

## 2018-01-15 DIAGNOSIS — C672 Malignant neoplasm of lateral wall of bladder: Secondary | ICD-10-CM

## 2018-01-18 ENCOUNTER — Telehealth: Payer: Self-pay | Admitting: Medical

## 2018-01-18 NOTE — Telephone Encounter (Signed)
Pt did miss her GI appointment. She has history of bladder cancer and is not undertreatment. Will you send pt letter expressing that we can refer her back to GI when she is ready. Pt pcp is Melissa.

## 2018-01-19 ENCOUNTER — Encounter: Payer: Self-pay | Admitting: *Deleted

## 2018-01-19 NOTE — Telephone Encounter (Signed)
Letter has been mailed to pt.

## 2018-01-24 ENCOUNTER — Telehealth: Payer: Self-pay

## 2018-01-24 DIAGNOSIS — Z1211 Encounter for screening for malignant neoplasm of colon: Secondary | ICD-10-CM

## 2018-01-24 NOTE — Telephone Encounter (Signed)
Author received a call from [facility] re: pt's confusion over missing GI appointment per letter received. Pt. was not aware she needed to see a gastroenterologist, only that she had a referral to see an oncologist [name]. Note forwarded to Percell Miller and PCP to follow-up with patient to clarify.

## 2018-01-24 NOTE — Telephone Encounter (Signed)
Author phoned pt. re: april referral for screening colonoscopy. Pt. stated she was open to having it done. Last referral cancelled d/t GI office unable to reach pt, so new referral placed for Donna Alar, NP to review. Pt. Stated she would be on the look out for a phone call in the coming days.

## 2018-01-24 NOTE — Telephone Encounter (Signed)
Looks like Edward placed referral for screening colonoscopy. She should reschedule at her convenience.

## 2018-02-09 ENCOUNTER — Other Ambulatory Visit: Payer: Self-pay

## 2018-02-09 ENCOUNTER — Encounter: Payer: Self-pay | Admitting: Hematology & Oncology

## 2018-02-09 ENCOUNTER — Inpatient Hospital Stay: Payer: Medicaid Other | Attending: Hematology & Oncology | Admitting: Hematology & Oncology

## 2018-02-09 ENCOUNTER — Inpatient Hospital Stay: Payer: Medicaid Other

## 2018-02-09 VITALS — BP 140/80 | HR 92 | Temp 98.3°F | Resp 18 | Wt 79.0 lb

## 2018-02-09 DIAGNOSIS — C679 Malignant neoplasm of bladder, unspecified: Secondary | ICD-10-CM

## 2018-02-09 DIAGNOSIS — C672 Malignant neoplasm of lateral wall of bladder: Secondary | ICD-10-CM

## 2018-02-09 DIAGNOSIS — Z7189 Other specified counseling: Secondary | ICD-10-CM

## 2018-02-09 HISTORY — DX: Other specified counseling: Z71.89

## 2018-02-09 LAB — CBC WITH DIFFERENTIAL (CANCER CENTER ONLY)
Basophils Absolute: 0 10*3/uL (ref 0.0–0.1)
Basophils Relative: 0 %
Eosinophils Absolute: 0.2 10*3/uL (ref 0.0–0.5)
Eosinophils Relative: 2 %
HCT: 39.7 % (ref 34.8–46.6)
Hemoglobin: 12.5 g/dL (ref 11.6–15.9)
Lymphocytes Relative: 15 %
Lymphs Abs: 1.3 10*3/uL (ref 0.9–3.3)
MCH: 29.4 pg (ref 26.0–34.0)
MCHC: 31.5 g/dL — ABNORMAL LOW (ref 32.0–36.0)
MCV: 93.4 fL (ref 81.0–101.0)
Monocytes Absolute: 0.8 10*3/uL (ref 0.1–0.9)
Monocytes Relative: 9 %
Neutro Abs: 6.4 10*3/uL (ref 1.5–6.5)
Neutrophils Relative %: 74 %
Platelet Count: 253 10*3/uL (ref 145–400)
RBC: 4.25 MIL/uL (ref 3.70–5.32)
RDW: 17.5 % — ABNORMAL HIGH (ref 11.1–15.7)
WBC Count: 8.7 10*3/uL (ref 3.9–10.0)

## 2018-02-09 LAB — CMP (CANCER CENTER ONLY)
ALT: 36 U/L (ref 10–47)
AST: 32 U/L (ref 11–38)
Albumin: 3.4 g/dL — ABNORMAL LOW (ref 3.5–5.0)
Alkaline Phosphatase: 87 U/L — ABNORMAL HIGH (ref 26–84)
Anion gap: 9 (ref 5–15)
BUN: 14 mg/dL (ref 7–22)
CO2: 32 mmol/L (ref 18–33)
Calcium: 9.3 mg/dL (ref 8.0–10.3)
Chloride: 106 mmol/L (ref 98–108)
Creatinine: 1 mg/dL (ref 0.60–1.20)
Glucose, Bld: 107 mg/dL (ref 73–118)
Potassium: 4.3 mmol/L (ref 3.3–4.7)
Sodium: 147 mmol/L — ABNORMAL HIGH (ref 128–145)
Total Bilirubin: 0.4 mg/dL (ref 0.2–1.6)
Total Protein: 7.8 g/dL (ref 6.4–8.1)

## 2018-02-09 LAB — LACTATE DEHYDROGENASE: LDH: 229 U/L — ABNORMAL HIGH (ref 98–192)

## 2018-02-09 NOTE — Progress Notes (Signed)
Hematology and Oncology Follow Up Visit  Donna Curry [id number] 1956-03-25 62 y.o. 02/09/2018   Principle Diagnosis:   Muscle invasive urothelial carcinoma of the bladder-nonmetastatic  Current Therapy:    Radiation/low-dose weekly cis-platinum-first dose to start on 02/22/2018     Interim History:  Donna Curry is back for her follow-up.  Thankfully, and miraculously, she does not have metastatic disease.  We went ahead and got a PET scan on her.  The PET scan showed no evidence of metastatic disease.  She did have activity within the bladder which was felt to be consistent with urinary excretion.  Her weight is up 9 pounds.  She does look better.  She is not hurting.  I do not believe she is a candidate for radical cystectomy.  She does not want to have a urostomy bag.  She is not bleeding.  She is having no hematuria.  There is no diarrhea.  She is having no leg swelling.  There are no rashes.  Overall, I would say that her performance status is ECOG 2.  Medications:  Current Outpatient Medications:  .  acetaminophen (TYLENOL) 500 MG tablet, Take 1,000 mg by mouth every 8 (eight) hours as needed for moderate pain., Disp: , Rfl:  .  amLODipine (NORVASC) 10 MG tablet, Take 10 mg by mouth daily., Disp: , Rfl:  .  diclofenac (VOLTAREN) 75 MG EC tablet, Take 1 tablet (75 mg total) by mouth 2 (two) times daily. (Patient not taking: Reported on 12/08/2017), Disp: 14 tablet, Rfl: 0 .  docusate sodium (COLACE) 100 MG capsule, Take 1 capsule (100 mg total) by mouth 2 (two) times daily., Disp: 10 capsule, Rfl: 0 .  dronabinol (MARINOL) 2.5 MG capsule, Take 1 capsule (2.5 mg total) by mouth 2 (two) times daily before a meal., Disp: 60 capsule, Rfl: 0 .  ferrous sulfate 325 (65 FE) MG tablet, Take 1 tablet (325 mg total) by mouth 2 (two) times daily with a meal., Disp: 60 tablet, Rfl: 3 .  megestrol (MEGACE ES) 625 MG/5ML suspension, Take 5 mLs (625 mg total) by mouth daily., Disp: 150 mL,  Rfl: 3 .  nicotine (NICODERM CQ - DOSED IN MG/24 HOURS) 21 mg/24hr patch, Place 1 patch (21 mg total) onto the skin daily., Disp: 28 patch, Rfl: 0 .  ondansetron (ZOFRAN) 4 MG tablet, Take 1 tablet (4 mg total) by mouth daily as needed for nausea or vomiting., Disp: 30 tablet, Rfl: 1 .  oxandrolone (OXANDRIN) 10 MG tablet, Take 1 tablet (10 mg total) by mouth daily., Disp: 30 tablet, Rfl: 3 .  Vitamin D, Ergocalciferol, (DRISDOL) 50000 units CAPS capsule, Take 1 capsule (50,000 Units total) by mouth every 7 (seven) days., Disp: 8 capsule, Rfl: 0  Allergies: No Known Allergies  Past Medical History, Surgical history, Social history, and Family History were reviewed and updated.  Review of Systems: Review of Systems  Constitutional: Negative.   HENT:  Negative.   Eyes: Negative.   Respiratory: Negative.   Cardiovascular: Negative.   Gastrointestinal: Negative.   Endocrine: Negative.   Genitourinary: Negative.    Musculoskeletal: Negative.   Skin: Negative.   Neurological: Negative.   Hematological: Negative.   Psychiatric/Behavioral: Negative.     Physical Exam:  weight is 79 lb (35.8 kg). Her oral temperature is 98.3 F (36.8 C). Her blood pressure is 140/80 and her pulse is 92. Her respiration is 18 and oxygen saturation is 100%.   Wt Readings from Last 3 Encounters:  02/09/18  79 lb (35.8 kg)  12/28/17 71 lb (32.2 kg)  12/13/17 77 lb 9.6 oz (35.2 kg)    Physical Exam  Constitutional: She is oriented to person, place, and time.  HENT:  Head: Normocephalic and atraumatic.  Mouth/Throat: Oropharynx is clear and moist.  Eyes: Pupils are equal, round, and reactive to light. EOM are normal.  Neck: Normal range of motion.  Cardiovascular: Normal rate, regular rhythm and normal heart sounds.  Pulmonary/Chest: Effort normal and breath sounds normal.  Abdominal: Soft. Bowel sounds are normal.  Her abdominal exam is soft.  She has decent bowel sounds.  There might be some fullness  in the hypoumbilical area.  She has no fluid wave.  There is no palpable liver or spleen tip.  Musculoskeletal: Normal range of motion. She exhibits no edema, tenderness or deformity.  Lymphadenopathy:    She has no cervical adenopathy.  Neurological: She is alert and oriented to person, place, and time.  Skin: Skin is warm and dry. No rash noted. No erythema.  Psychiatric: She has a normal mood and affect. Her behavior is normal. Judgment and thought content normal.  Vitals reviewed.    Lab Results  Component Value Date   WBC 8.7 02/09/2018   HGB 12.5 02/09/2018   HCT 39.7 02/09/2018   MCV 93.4 02/09/2018   PLT 253 02/09/2018     Chemistry      Component Value Date/Time   NA 147 (H) 02/09/2018 0753   K 4.3 02/09/2018 0753   CL 106 02/09/2018 0753   CO2 32 02/09/2018 0753   BUN 14 02/09/2018 0753   CREATININE 1.00 02/09/2018 0753   CREATININE 1.10 08/20/2013 1027      Component Value Date/Time   CALCIUM 9.3 02/09/2018 0753   ALKPHOS 87 (H) 02/09/2018 0753   AST 32 02/09/2018 0753   ALT 36 02/09/2018 0753   BILITOT 0.4 02/09/2018 0753         Impression and Plan: Donna Curry is a 62 year old African-American female.  She has locally advanced high-grade urothelial carcinoma of the bladder.  I am thankful that she has gained a little bit of weight.  This, hopefully, will help her tolerate treatment better.  I think she would be a good candidate for combination radiation therapy and chemotherapy.  I would utilize weekly cis-platinum as a radiation sensitizer.  She comes in with her daughter.  I spent about 45 minutes with both of them.  All the time was spent face-to-face.  I counseled them and coordinated her care with respect to her radiation and chemotherapy.  I spoke to the radiation therapist at [facility].  I went over the side effects of cis-platinum.  I do not think weekly low-dose cisplatin would cause a lot of side effects.  The big thing is that we  have to watch her kidney function and make sure she is well-hydrated.  She will need a Port-A-Cath.  I will have this placed by radiology on July 31.  I would like to get treatment started on 1 August.  I think this would be very reasonable.  I answered all of their questions.  They feel very confident getting treatment in our office.  They very much appreciate the care that our staff has given her.  I will see her back on August 1, the day that chemotherapy will start to make sure she is doing okay.   [name], MD 7/19/20193:45 PM

## 2018-02-09 NOTE — Progress Notes (Signed)
Bladder - No Medical Intervention - Off Treatment.  Patient Characteristics: Post Cystectomy, T3-T4a, N0, M0, No Previous Neoadjuvant Chemotherapy AJCC M Category: M0 AJCC N Category: N0 AJCC T Category: pT3b Current evidence of distant metastases<= No AJCC 8 Stage Grouping: IIIA

## 2018-02-22 ENCOUNTER — Inpatient Hospital Stay: Payer: Medicaid Other

## 2018-02-22 ENCOUNTER — Other Ambulatory Visit: Payer: Self-pay | Admitting: Student

## 2018-02-22 ENCOUNTER — Inpatient Hospital Stay: Payer: Medicaid Other | Attending: Hematology & Oncology | Admitting: Hematology & Oncology

## 2018-02-22 ENCOUNTER — Other Ambulatory Visit: Payer: Medicaid Other

## 2018-02-22 ENCOUNTER — Other Ambulatory Visit: Payer: Self-pay | Admitting: Radiology

## 2018-02-22 DIAGNOSIS — R0602 Shortness of breath: Secondary | ICD-10-CM | POA: Insufficient documentation

## 2018-02-22 DIAGNOSIS — D500 Iron deficiency anemia secondary to blood loss (chronic): Secondary | ICD-10-CM | POA: Insufficient documentation

## 2018-02-22 DIAGNOSIS — Z5111 Encounter for antineoplastic chemotherapy: Secondary | ICD-10-CM | POA: Insufficient documentation

## 2018-02-22 DIAGNOSIS — C679 Malignant neoplasm of bladder, unspecified: Secondary | ICD-10-CM | POA: Insufficient documentation

## 2018-02-23 ENCOUNTER — Other Ambulatory Visit: Payer: Self-pay | Admitting: Hematology & Oncology

## 2018-02-23 ENCOUNTER — Ambulatory Visit (HOSPITAL_COMMUNITY)
Admission: RE | Admit: 2018-02-23 | Discharge: 2018-02-23 | Disposition: A | Discharge status: Home / Self Care | Payer: Medicaid Other | Source: Ambulatory Visit | Attending: Radiology | Admitting: Radiology

## 2018-02-23 ENCOUNTER — Ambulatory Visit (HOSPITAL_COMMUNITY)
Admission: RE | Admit: 2018-02-23 | Discharge: 2018-02-23 | Disposition: A | Discharge status: Home / Self Care | Payer: Medicaid Other | Source: Ambulatory Visit | Attending: Hematology & Oncology | Admitting: Hematology & Oncology

## 2018-02-23 ENCOUNTER — Encounter (HOSPITAL_COMMUNITY): Payer: Self-pay

## 2018-02-23 DIAGNOSIS — I10 Essential (primary) hypertension: Secondary | ICD-10-CM | POA: Insufficient documentation

## 2018-02-23 DIAGNOSIS — D649 Anemia, unspecified: Secondary | ICD-10-CM | POA: Insufficient documentation

## 2018-02-23 DIAGNOSIS — Z9889 Other specified postprocedural states: Secondary | ICD-10-CM | POA: Diagnosis not present

## 2018-02-23 DIAGNOSIS — Z8673 Personal history of transient ischemic attack (TIA), and cerebral infarction without residual deficits: Secondary | ICD-10-CM | POA: Insufficient documentation

## 2018-02-23 DIAGNOSIS — Z960 Presence of urogenital implants: Secondary | ICD-10-CM | POA: Insufficient documentation

## 2018-02-23 DIAGNOSIS — Z8619 Personal history of other infectious and parasitic diseases: Secondary | ICD-10-CM | POA: Diagnosis not present

## 2018-02-23 DIAGNOSIS — F17210 Nicotine dependence, cigarettes, uncomplicated: Secondary | ICD-10-CM | POA: Insufficient documentation

## 2018-02-23 DIAGNOSIS — Z87442 Personal history of urinary calculi: Secondary | ICD-10-CM | POA: Diagnosis not present

## 2018-02-23 DIAGNOSIS — C672 Malignant neoplasm of lateral wall of bladder: Secondary | ICD-10-CM

## 2018-02-23 DIAGNOSIS — J449 Chronic obstructive pulmonary disease, unspecified: Secondary | ICD-10-CM | POA: Insufficient documentation

## 2018-02-23 DIAGNOSIS — Z8249 Family history of ischemic heart disease and other diseases of the circulatory system: Secondary | ICD-10-CM | POA: Insufficient documentation

## 2018-02-23 DIAGNOSIS — Z79899 Other long term (current) drug therapy: Secondary | ICD-10-CM | POA: Insufficient documentation

## 2018-02-23 DIAGNOSIS — R0600 Dyspnea, unspecified: Secondary | ICD-10-CM

## 2018-02-23 HISTORY — PX: IR IMAGING GUIDED PORT INSERTION: IMG5740

## 2018-02-23 LAB — CBC
HCT: 41.2 % (ref 36.0–46.0)
Hemoglobin: 13.4 g/dL (ref 12.0–15.0)
MCH: 29.5 pg (ref 26.0–34.0)
MCHC: 32.5 g/dL (ref 30.0–36.0)
MCV: 90.7 fL (ref 78.0–100.0)
Platelets: 356 10*3/uL (ref 150–400)
RBC: 4.54 MIL/uL (ref 3.87–5.11)
RDW: 17 % — ABNORMAL HIGH (ref 11.5–15.5)
WBC: 6.6 10*3/uL (ref 4.0–10.5)

## 2018-02-23 LAB — PROTIME-INR
INR: 0.94
Prothrombin Time: 12.5 seconds (ref 11.4–15.2)

## 2018-02-23 LAB — APTT: aPTT: 29 seconds (ref 24–36)

## 2018-02-23 MED ORDER — FENTANYL CITRATE (PF) 100 MCG/2ML IJ SOLN
INTRAMUSCULAR | Status: AC | PRN
  Administered 2018-02-23 (×2): 50 ug via INTRAVENOUS

## 2018-02-23 MED ORDER — MIDAZOLAM HCL 2 MG/2ML IJ SOLN
INTRAMUSCULAR | Status: AC | PRN
  Administered 2018-02-23 (×3): 1 mg via INTRAVENOUS

## 2018-02-23 MED ORDER — FENTANYL CITRATE (PF) 100 MCG/2ML IJ SOLN
INTRAMUSCULAR | Status: AC
  Filled 2018-02-23: qty 2

## 2018-02-23 MED ORDER — LIDOCAINE HCL (PF) 1 % IJ SOLN
INTRAMUSCULAR | Status: AC | PRN
  Administered 2018-02-23: 10 mL

## 2018-02-23 MED ORDER — CEFAZOLIN SODIUM-DEXTROSE 2-4 GM/100ML-% IV SOLN
2.0000 g | INTRAVENOUS | Status: AC
  Administered 2018-02-23: 2 g via INTRAVENOUS

## 2018-02-23 MED ORDER — FLUMAZENIL 0.5 MG/5ML IV SOLN
INTRAVENOUS | Status: AC
  Filled 2018-02-23: qty 5

## 2018-02-23 MED ORDER — SODIUM CHLORIDE 0.9 % IV SOLN
INTRAVENOUS | Status: DC
  Administered 2018-02-23: 12:00:00 via INTRAVENOUS

## 2018-02-23 MED ORDER — MIDAZOLAM HCL 2 MG/2ML IJ SOLN
INTRAMUSCULAR | Status: AC
  Filled 2018-02-23: qty 4

## 2018-02-23 MED ORDER — LIDOCAINE HCL 1 % IJ SOLN
INTRAMUSCULAR | Status: AC
  Filled 2018-02-23: qty 20

## 2018-02-23 MED ORDER — HEPARIN SOD (PORK) LOCK FLUSH 100 UNIT/ML IV SOLN
INTRAVENOUS | Status: AC
  Filled 2018-02-23: qty 5

## 2018-02-23 MED ORDER — FLUMAZENIL 0.5 MG/5ML IV SOLN
0.2000 mg | Freq: Once | INTRAVENOUS | Status: AC
  Administered 2018-02-23: 0.2 mg via INTRAVENOUS

## 2018-02-23 MED ORDER — CEFAZOLIN SODIUM-DEXTROSE 2-4 GM/100ML-% IV SOLN
INTRAVENOUS | Status: AC
  Administered 2018-02-23: 2 g via INTRAVENOUS
  Filled 2018-02-23: qty 100

## 2018-02-23 MED ORDER — SODIUM CHLORIDE 0.9 % IV BOLUS
500.0000 mL | Freq: Once | INTRAVENOUS | Status: AC
  Administered 2018-02-23: 500 mL via INTRAVENOUS

## 2018-02-23 MED ORDER — HEPARIN SOD (PORK) LOCK FLUSH 100 UNIT/ML IV SOLN
INTRAVENOUS | Status: AC | PRN
  Administered 2018-02-23: 500 [IU] via INTRAVENOUS

## 2018-02-23 NOTE — Progress Notes (Signed)
Patient ID: Donna Curry, female   DOB: 1955/12/16, 62 y.o.   MRN: [id number] Patient status post Port-A-Cath placement this afternoon; still a little lethargic with some dyspnea.  Received Versed 3 mg IV and fentanyl 100 mcg IV during procedure.  Patient arousable, does answer questions appropriately.  Denies chest pain or coughing.  Distant breath sounds bilaterally.  Heart with regular rate and rhythm.  Port-A-Cath site clean and dry, nontender.  O2 sats in the mid 90s on 3 L nasal cannula with some drop off O2.  Will give Romazicon, check chest x-ray and give additional fluid bolus.  Dr. Earleen Newport in to see patient as well.

## 2018-02-23 NOTE — Progress Notes (Signed)
Patient ID: Donna Curry, female   DOB: 12-12-1955, 62 y.o.   MRN: [id number] Pt stable; CXR ok; once she awakens a little more she can be d/c'd home with family member. Dr. Wagner/nursing/family updated.

## 2018-02-23 NOTE — Discharge Instructions (Addendum)
Please keep your dressing on for 24 hours. After 24 hours you may take a shower.  Do not use EMLA cream until all steri-strips and glue come off on their own.    Moderate Conscious Sedation, Adult, Care After These instructions provide you with information about caring for yourself after your procedure. Your health care provider may also give you more specific instructions. Your treatment has been planned according to current medical practices, but problems sometimes occur. Call your health care provider if you have any problems or questions after your procedure. What can I expect after the procedure? After your procedure, it is common:  To feel sleepy for several hours.  To feel clumsy and have poor balance for several hours.  To have poor judgment for several hours.  To vomit if you eat too soon.  Follow these instructions at home: For at least 24 hours after the procedure:   Do not: ? Participate in activities where you could fall or become injured. ? Drive. ? Use heavy machinery. ? Drink alcohol. ? Take sleeping pills or medicines that cause drowsiness. ? Make important decisions or sign legal documents. ? Take care of children on your own.  Rest. Eating and drinking  Follow the diet recommended by your health care provider.  If you vomit: ? Drink water, juice, or soup when you can drink without vomiting. ? Make sure you have little or no nausea before eating solid foods. General instructions  Have a responsible adult stay with you until you are awake and alert.  Take over-the-counter and prescription medicines only as told by your health care provider.  If you smoke, do not smoke without supervision.  Keep all follow-up visits as told by your health care provider. This is important. Contact a health care provider if:  You keep feeling nauseous or you keep vomiting.  You feel light-headed.  You develop a rash.  You have a fever. Get help right away  if:  You have trouble breathing. This information is not intended to replace advice given to you by your health care provider. Make sure you discuss any questions you have with your health care provider. Document Released: 05/01/2013 Document Revised: 12/14/2015 Document Reviewed: 10/31/2015 Elsevier Interactive Patient Education  2018 [address] Insertion, Care After This sheet gives you information about how to care for yourself after your procedure. Your health care provider may also give you more specific instructions. If you have problems or questions, contact your health care provider. What can I expect after the procedure? After your procedure, it is common to have:  Discomfort at the port insertion site.  Bruising on the skin over the port. This should improve over 3-4 days.  Follow these instructions at home: [facility] care  After your port is placed, you will get a manufacturer's information card. The card has information about your port. Keep this card with you at all times.  Take care of the port as told by your health care provider. Ask your health care provider if you or a family member can get training for taking care of the port at home. A home health care nurse may also take care of the port.  Make sure to remember what type of port you have. Incision care  Follow instructions from your health care provider about how to take care of your port insertion site. Make sure you: ? Wash your hands with soap and water before you change your bandage (dressing). If  soap and water are not available, use hand sanitizer. ? Change your dressing as told by your health care provider. ? Leave stitches (sutures), skin glue, or adhesive strips in place. These skin closures may need to stay in place for 2 weeks or longer. If adhesive strip edges start to loosen and curl up, you may trim the loose edges. Do not remove adhesive strips completely unless your health care  provider tells you to do that.  Check your port insertion site every day for signs of infection. Check for: ? More redness, swelling, or pain. ? More fluid or blood. ? Warmth. ? Pus or a bad smell. General instructions  Do not take baths, swim, or use a hot tub until your health care provider approves.  Do not lift anything that is heavier than 10 lb (4.5 kg) for a week, or as told by your health care provider.  Ask your health care provider when it is okay to: ? Return to work or school. ? Resume usual physical activities or sports.  Do not drive for 24 hours if you were given a medicine to help you relax (sedative).  Take over-the-counter and prescription medicines only as told by your health care provider.  Wear a medical alert bracelet in case of an emergency. This will tell any health care providers that you have a port.  Keep all follow-up visits as told by your health care provider. This is important. Contact a health care provider if:  You have a fever or chills.  You have more redness, swelling, or pain around your port insertion site.  You have more fluid or blood coming from your port insertion site.  Your port insertion site feels warm to the touch.  You have pus or a bad smell coming from the port insertion site. Get help right away if:  You have chest pain or shortness of breath.  You have bleeding from your port that you cannot control. Summary  Take care of the port as told by your health care provider.  Change your dressing as told by your health care provider.  Keep all follow-up visits as told by your health care provider. This information is not intended to replace advice given to you by your health care provider. Make sure you discuss any questions you have with your health care provider. Document Released: 05/01/2013 Document Revised: 06/01/2016 Document Reviewed: 06/01/2016 Elsevier Interactive Patient Education  2017 [employer].

## 2018-02-23 NOTE — Consult Note (Signed)
Chief Complaint: Patient was seen in consultation today for Port-A-Cath placement  Referring Physician(s): [name],Peter R  Supervising Physician: Corrie Mckusick  Patient Status: [facility] - Out-pt  History of Present Illness: Donna Curry is a 62 y.o. female smoker with history of recently diagnosed bladder carcinoma who presents today for Port-A-Cath placement for chemotherapy.  Past Medical History:  Diagnosis Date  . Anemia   . Family history of adverse reaction to anesthesia    brother wakes up and dont know who he is and sister has n/v  . Goals of care, counseling/discussion 02/09/2018  . History of kidney stones   . Hypertension   . Measles as a child  . Mumps as a child  . TIA (transient ischemic attack)   . Tobacco abuse     Past Surgical History:  Procedure Laterality Date  . CYSTOSCOPY W/ URETERAL STENT PLACEMENT Right 12/13/2017   Procedure: CYSTOSCOPY WITH RIGHT RETROGRADE PYELOGRAM/URETERAL RIGHT STENT PLACEMENT;  Surgeon: [name], MD;  Location: [facility];  Service: Urology;  Laterality: Right;  . TRANSURETHRAL RESECTION OF BLADDER TUMOR N/A 12/13/2017   Procedure: TRANSURETHRAL RESECTION OF BLADDER TUMOR (TURBT);  Surgeon: [name], MD;  Location: [facility];  Service: Urology;  Laterality: N/A;  . TRANSURETHRAL RESECTION OF BLADDER TUMOR WITH GYRUS (TURBT-GYRUS)     [name] 12-13-17  . tubes tied    . uterine ablation     about 2005    Allergies: Patient has no known allergies.  Medications: Prior to Admission medications   Medication Sig Start Date End Date Taking? Authorizing Provider  acetaminophen (TYLENOL) 500 MG tablet Take 1,000 mg by mouth every 8 (eight) hours as needed for moderate pain.    [provider]  amLODipine (NORVASC) 10 MG tablet Take 10 mg by mouth daily.    [provider]  diclofenac (VOLTAREN) 75 MG EC tablet Take 1 tablet (75 mg total) by mouth 2 (two) times daily. Patient not taking: Reported  on 12/08/2017 11/03/17   Saguier, Percell Miller, PA-C  docusate sodium (COLACE) 100 MG capsule Take 1 capsule (100 mg total) by mouth 2 (two) times daily. 12/14/17   [name], MD  dronabinol (MARINOL) 2.5 MG capsule Take 1 capsule (2.5 mg total) by mouth 2 (two) times daily before a meal. 12/28/17   [name], [name], MD  ferrous sulfate 325 (65 FE) MG tablet Take 1 tablet (325 mg total) by mouth 2 (two) times daily with a meal. 11/06/17   Saguier, Percell Miller, PA-C  megestrol (MEGACE ES) 625 MG/5ML suspension Take 5 mLs (625 mg total) by mouth daily. 12/28/17   [name], MD  nicotine (NICODERM CQ - DOSED IN MG/24 HOURS) 21 mg/24hr patch Place 1 patch (21 mg total) onto the skin daily. 04/05/16   Cheryln Manly, NP  ondansetron (ZOFRAN) 4 MG tablet Take 1 tablet (4 mg total) by mouth daily as needed for nausea or vomiting. 12/14/17 12/14/18  [name], MD  oxandrolone (OXANDRIN) 10 MG tablet Take 1 tablet (10 mg total) by mouth daily. 12/28/17   [name], MD  Vitamin D, Ergocalciferol, (DRISDOL) 50000 units CAPS capsule Take 1 capsule (50,000 Units total) by mouth every 7 (seven) days. 11/04/17   Saguier, Percell Miller, PA-C     Family History  Problem Relation Age of Onset  . Hypertension Mother   . Diabetes Father   . Hypertension Brother   . HIV Brother  Social History   Socioeconomic History  . Marital status: Single    Spouse name: Not on file  . Number of children: Not on file  . Years of education: Not on file  . Highest education level: Not on file  Occupational History  . Not on file  Social Needs  . Financial resource strain: Not on file  . Food insecurity:    Worry: Not on file    Inability: Not on file  . Transportation needs:    Medical: Not on file    Non-medical: Not on file  Tobacco Use  . Smoking status: Current Some Day Smoker    Packs/day: 0.50    Types: Cigarettes    Last attempt to quit: 03/22/2013    Years since quitting: 4.9  . Smokeless  tobacco: Never Used  . Tobacco comment: 1/4 pack per day,Hasnt smoked in 8 days  Substance and Sexual Activity  . Alcohol use: No    Alcohol/week: 0.0 oz  . Drug use: Yes    Comment: marijuana denies at preop  . Sexual activity: Not Currently  Lifestyle  . Physical activity:    Days per week: Not on file    Minutes per session: Not on file  . Stress: Not on file  Relationships  . Social connections:    Talks on phone: Not on file    Gets together: Not on file    Attends religious service: Not on file    Active member of club or organization: Not on file    Attends meetings of clubs or organizations: Not on file    Relationship status: Not on file  Other Topics Concern  . Not on file  Social History Narrative   Denies hx of drug use   Single   1 daughter age 42 lives with daughter and grandson who is 46.   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      Review of Systems currently denies fever, headache, chest pain, cough, abdominal pain, nausea, vomiting or bleeding.  She does have some dyspnea with exertion and left hip discomfort.  Vital Signs: BP (!) 137/93 (BP Location: Right Arm)   Pulse 93   Temp 98.3 F (36.8 C) (Oral)   Resp 18   SpO2 94%   Physical Exam awake, alert.  Chest with distant breath sounds bilaterally.  Heart with regular rate and rhythm.  Abdomen soft, positive bowel sounds, nontender.  No lower extremity edema.  Imaging: No results found.  Labs:  CBC: Recent Labs    12/06/17 1208 12/08/17 1212 12/13/17 1445 12/14/17 0427 12/28/17 1205 02/09/18 0753  WBC 6.5 6.5  --   --  5.9 8.7  HGB 8.6 Repeated and verified X2.* 8.9* 11.7* 9.3* 12.6 12.5  HCT 27.3* 30.1* 37.3 29.5* 40.1 39.7  PLT 354.0 346  --   --  304 253    COAGS: No results for input(s): INR, APTT in the last 8760 hours.  BMP: Recent Labs    11/12/17 1021  12/13/17 1445 12/14/17 0427 12/28/17 1205 02/09/18 0753  NA 134*   < > 140 140  146* 147*  K 4.0   < > 4.4 4.3 4.5 4.3  CL 96*   < > 104 106 104 106  CO2 24   < > 26 23 28  32  GLUCOSE 115*   < > 125* 95 91 107  BUN 32*   < > 10 11 10  14  CALCIUM 9.9   < > 8.9 8.7* 10.4 9.3  CREATININE 1.34*   < > 1.00 0.95 1.02 1.00  GFRNONAA 42*  --  60* >60 58*  --   GFRAA 48*  --  >60 >60 >60  --    < > = values in this interval not displayed.    LIVER FUNCTION TESTS: Recent Labs    11/12/17 1127 11/30/17 1400 12/28/17 1205 02/09/18 0753  BILITOT 0.7 0.3 0.2 0.4  AST 49* 10 20 32  ALT 37 8 15 36  ALKPHOS 90 63 108 87*  PROT 8.3* 6.7 8.1 7.8  ALBUMIN 4.3 3.8 4.0 3.4*    TUMOR MARKERS: No results for input(s): AFPTM, CEA, CA199, CHROMGRNA in the last 8760 hours.  Assessment and Plan: 62 y.o. female smoker with history of recently diagnosed bladder carcinoma who presents today for Port-A-Cath placement for chemotherapy.Risks and benefits of image guided port-a-catheter placement was discussed with the patient including, but not limited to bleeding, infection, pneumothorax, or fibrin sheath development and need for additional procedures.  All of the patient's questions were answered, patient is agreeable to proceed. Consent signed and in chart.   LABS PENDING  Thank you for this interesting consult.  I greatly enjoyed meeting Donna Curry and look forward to participating in their care.  A copy of this report was sent to the requesting provider on this date.  Electronically Signed: D. Rowe Robert, PA-C 02/23/2018, 11:56 AM   I spent a total of 25 minutes    in face to face in clinical consultation, greater than 50% of which was counseling/coordinating care for Port-A-Cath placement

## 2018-02-23 NOTE — Procedures (Signed)
Interventional Radiology Procedure Note  Procedure: Placement of a right IJ approach single lumen PowerPort.  Tip is positioned at the superior cavoatrial junction and catheter is ready for immediate use.  Complications: None Recommendations:  - Ok to shower tomorrow - Do not submerge for 7 days - Routine line care   Signed,  Levenia Skalicky S. Maleyah Evans, DO   

## 2018-02-23 NOTE — Progress Notes (Signed)
Notified Dr. Earleen Newport pts sats are 96 on room air but when she does any activity, her sats drop to the 80s. Her sister states she does not feel comfortable taking the patient home with her sats dropping. Per Dr. Earleen Newport pt needs to follow up with primary doctor. Per MD pt is okay to be discharged.   Notified sister of this and she is okay with taking the patient home.

## 2018-02-28 ENCOUNTER — Other Ambulatory Visit: Payer: Self-pay | Admitting: Hematology & Oncology

## 2018-03-01 ENCOUNTER — Inpatient Hospital Stay: Payer: Medicaid Other

## 2018-03-01 ENCOUNTER — Encounter: Payer: Self-pay | Admitting: Hematology & Oncology

## 2018-03-01 ENCOUNTER — Other Ambulatory Visit: Payer: Self-pay | Admitting: *Deleted

## 2018-03-01 ENCOUNTER — Inpatient Hospital Stay (HOSPITAL_BASED_OUTPATIENT_CLINIC_OR_DEPARTMENT_OTHER): Payer: Medicaid Other | Admitting: Hematology & Oncology

## 2018-03-01 ENCOUNTER — Other Ambulatory Visit: Payer: Self-pay

## 2018-03-01 VITALS — BP 154/82 | HR 88 | Temp 98.2°F | Resp 16

## 2018-03-01 DIAGNOSIS — C679 Malignant neoplasm of bladder, unspecified: Secondary | ICD-10-CM

## 2018-03-01 DIAGNOSIS — C672 Malignant neoplasm of lateral wall of bladder: Secondary | ICD-10-CM

## 2018-03-01 DIAGNOSIS — Z5111 Encounter for antineoplastic chemotherapy: Secondary | ICD-10-CM | POA: Diagnosis present

## 2018-03-01 DIAGNOSIS — D500 Iron deficiency anemia secondary to blood loss (chronic): Secondary | ICD-10-CM | POA: Diagnosis not present

## 2018-03-01 DIAGNOSIS — R0602 Shortness of breath: Secondary | ICD-10-CM | POA: Diagnosis not present

## 2018-03-01 HISTORY — DX: Iron deficiency anemia secondary to blood loss (chronic): D50.0

## 2018-03-01 LAB — CMP (CANCER CENTER ONLY)
ALT: 30 U/L (ref 10–47)
AST: 30 U/L (ref 11–38)
Albumin: 3.3 g/dL — ABNORMAL LOW (ref 3.5–5.0)
Alkaline Phosphatase: 89 U/L — ABNORMAL HIGH (ref 26–84)
Anion gap: 11 (ref 5–15)
BUN: 14 mg/dL (ref 7–22)
CO2: 29 mmol/L (ref 18–33)
Calcium: 9.4 mg/dL (ref 8.0–10.3)
Chloride: 103 mmol/L (ref 98–108)
Creatinine: 1 mg/dL (ref 0.60–1.20)
Glucose, Bld: 105 mg/dL (ref 73–118)
Potassium: 4.2 mmol/L (ref 3.3–4.7)
Sodium: 143 mmol/L (ref 128–145)
Total Bilirubin: 0.5 mg/dL (ref 0.2–1.6)
Total Protein: 7.3 g/dL (ref 6.4–8.1)

## 2018-03-01 LAB — IRON AND TIBC
Iron: 52 ug/dL (ref 41–142)
Saturation Ratios: 14 % — ABNORMAL LOW (ref 21–57)
TIBC: 360 ug/dL (ref 236–444)
UIBC: 308 ug/dL

## 2018-03-01 LAB — CBC WITH DIFFERENTIAL (CANCER CENTER ONLY)
Basophils Absolute: 0 10*3/uL (ref 0.0–0.1)
Basophils Relative: 0 %
Eosinophils Absolute: 0.1 10*3/uL (ref 0.0–0.5)
Eosinophils Relative: 1 %
HCT: 36.7 % (ref 34.8–46.6)
Hemoglobin: 11.9 g/dL (ref 11.6–15.9)
Lymphocytes Relative: 7 %
Lymphs Abs: 0.5 10*3/uL — ABNORMAL LOW (ref 0.9–3.3)
MCH: 29.4 pg (ref 26.0–34.0)
MCHC: 32.4 g/dL (ref 32.0–36.0)
MCV: 90.6 fL (ref 81.0–101.0)
Monocytes Absolute: 0.5 10*3/uL (ref 0.1–0.9)
Monocytes Relative: 7 %
Neutro Abs: 5.7 10*3/uL (ref 1.5–6.5)
Neutrophils Relative %: 85 %
Platelet Count: 265 10*3/uL (ref 145–400)
RBC: 4.05 MIL/uL (ref 3.70–5.32)
RDW: 16.5 % — ABNORMAL HIGH (ref 11.1–15.7)
WBC Count: 6.7 10*3/uL (ref 3.9–10.0)

## 2018-03-01 LAB — FERRITIN: Ferritin: 42 ng/mL (ref 11–307)

## 2018-03-01 LAB — LACTATE DEHYDROGENASE: LDH: 246 U/L — ABNORMAL HIGH (ref 98–192)

## 2018-03-01 MED ORDER — DEXAMETHASONE 4 MG PO TABS: ORAL_TABLET | ORAL | 1 refills | Status: DC

## 2018-03-01 MED ORDER — LORAZEPAM 0.5 MG PO TABS: 0.5000 mg | ORAL_TABLET | Freq: Four times a day (QID) | ORAL | 0 refills | Status: DC | PRN

## 2018-03-01 MED ORDER — LIDOCAINE-PRILOCAINE 2.5-2.5 % EX CREA
TOPICAL_CREAM | CUTANEOUS | 3 refills | Status: DC
Start: 2018-03-01 — End: 2018-11-05

## 2018-03-01 MED ORDER — ONDANSETRON HCL 8 MG PO TABS: 8.0000 mg | ORAL_TABLET | Freq: Two times a day (BID) | ORAL | 1 refills | Status: DC | PRN

## 2018-03-01 MED ORDER — PALONOSETRON HCL INJECTION 0.25 MG/5ML
0.2500 mg | Freq: Once | INTRAVENOUS | Status: AC
  Administered 2018-03-01: 0.25 mg via INTRAVENOUS

## 2018-03-01 MED ORDER — SODIUM CHLORIDE 0.9 % IV SOLN
Freq: Once | INTRAVENOUS | Status: AC
  Administered 2018-03-01: 12:00:00 via INTRAVENOUS
  Filled 2018-03-01: qty 5

## 2018-03-01 MED ORDER — SODIUM CHLORIDE 0.9% FLUSH
10.0000 mL | INTRAVENOUS | Status: DC | PRN
  Administered 2018-03-01: 10 mL
  Filled 2018-03-01: qty 10

## 2018-03-01 MED ORDER — PALONOSETRON HCL INJECTION 0.25 MG/5ML
INTRAVENOUS | Status: AC
  Filled 2018-03-01: qty 5

## 2018-03-01 MED ORDER — SODIUM CHLORIDE 0.9 % IV SOLN
Freq: Once | INTRAVENOUS | Status: AC
  Administered 2018-03-01: 09:00:00 via INTRAVENOUS
  Filled 2018-03-01: qty 250

## 2018-03-01 MED ORDER — POTASSIUM CHLORIDE 2 MEQ/ML IV SOLN
Freq: Once | INTRAVENOUS | Status: AC
  Administered 2018-03-01: 10:00:00 via INTRAVENOUS
  Filled 2018-03-01: qty 10

## 2018-03-01 MED ORDER — PROCHLORPERAZINE MALEATE 10 MG PO TABS: 10.0000 mg | ORAL_TABLET | Freq: Four times a day (QID) | ORAL | 1 refills | Status: DC | PRN

## 2018-03-01 MED ORDER — LORAZEPAM 0.5 MG PO TABS
0.5000 mg | ORAL_TABLET | Freq: Four times a day (QID) | ORAL | 0 refills | Status: DC | PRN
Start: 2018-03-01 — End: 2018-03-01

## 2018-03-01 MED ORDER — HEPARIN SOD (PORK) LOCK FLUSH 100 UNIT/ML IV SOLN
500.0000 [IU] | Freq: Once | INTRAVENOUS | Status: AC | PRN
  Administered 2018-03-01: 500 [IU]
  Filled 2018-03-01: qty 5

## 2018-03-01 MED ORDER — SODIUM CHLORIDE 0.9 % IV SOLN
35.0000 mg/m2 | Freq: Once | INTRAVENOUS | Status: AC
  Administered 2018-03-01: 44 mg via INTRAVENOUS
  Filled 2018-03-01: qty 44

## 2018-03-01 NOTE — Patient Instructions (Signed)
Cisplatin injection What is this medicine? CISPLATIN (SIS pla tin) is a chemotherapy drug. It targets fast dividing cells, like cancer cells, and causes these cells to die. This medicine is used to treat many types of cancer like bladder, ovarian, and testicular cancers. This medicine may be used for other purposes; ask your health care provider or pharmacist if you have questions. COMMON BRAND NAME(S): Platinol, Platinol -AQ What should I tell my health care provider before I take this medicine? They need to know if you have any of these conditions: -blood disorders -hearing problems -kidney disease -recent or ongoing radiation therapy -an unusual or allergic reaction to cisplatin, carboplatin, other chemotherapy, other medicines, foods, dyes, or preservatives -pregnant or trying to get pregnant -breast-feeding How should I use this medicine? This drug is given as an infusion into a vein. It is administered in a hospital or clinic by a specially trained health care professional. Talk to your pediatrician regarding the use of this medicine in children. Special care may be needed. Overdosage: If you think you have taken too much of this medicine contact a poison control center or emergency room at once. NOTE: This medicine is only for you. Do not share this medicine with others. What if I miss a dose? It is important not to miss a dose. Call your doctor or health care professional if you are unable to keep an appointment. What may interact with this medicine? -dofetilide -foscarnet -medicines for seizures -medicines to increase blood counts like filgrastim, pegfilgrastim, sargramostim -probenecid -pyridoxine used with altretamine -rituximab -some antibiotics like amikacin, gentamicin, neomycin, polymyxin B, streptomycin, tobramycin -sulfinpyrazone -vaccines -zalcitabine Talk to your doctor or health care professional before taking any of these  medicines: -acetaminophen -aspirin -ibuprofen -ketoprofen -naproxen This list may not describe all possible interactions. Give your health care provider a list of all the medicines, herbs, non-prescription drugs, or dietary supplements you use. Also tell them if you smoke, drink alcohol, or use illegal drugs. Some items may interact with your medicine. What should I watch for while using this medicine? Your condition will be monitored carefully while you are receiving this medicine. You will need important blood work done while you are taking this medicine. This drug may make you feel generally unwell. This is not uncommon, as chemotherapy can affect healthy cells as well as cancer cells. Report any side effects. Continue your course of treatment even though you feel ill unless your doctor tells you to stop. In some cases, you may be given additional medicines to help with side effects. Follow all directions for their use. Call your doctor or health care professional for advice if you get a fever, chills or sore throat, or other symptoms of a cold or flu. Do not treat yourself. This drug decreases your body's ability to fight infections. Try to avoid being around people who are sick. This medicine may increase your risk to bruise or bleed. Call your doctor or health care professional if you notice any unusual bleeding. Be careful brushing and flossing your teeth or using a toothpick because you may get an infection or bleed more easily. If you have any dental work done, tell your dentist you are receiving this medicine. Avoid taking products that contain aspirin, acetaminophen, ibuprofen, naproxen, or ketoprofen unless instructed by your doctor. These medicines may hide a fever. Do not become pregnant while taking this medicine. Women should inform their doctor if they wish to become pregnant or think they might be pregnant. There is a   potential for serious side effects to an unborn child. Talk to  your health care professional or pharmacist for more information. Do not breast-feed an infant while taking this medicine. Drink fluids as directed while you are taking this medicine. This will help protect your kidneys. Call your doctor or health care professional if you get diarrhea. Do not treat yourself. What side effects may I notice from receiving this medicine? Side effects that you should report to your doctor or health care professional as soon as possible: -allergic reactions like skin rash, itching or hives, swelling of the face, lips, or tongue -signs of infection - fever or chills, cough, sore throat, pain or difficulty passing urine -signs of decreased platelets or bleeding - bruising, pinpoint red spots on the skin, black, tarry stools, nosebleeds -signs of decreased red blood cells - unusually weak or tired, fainting spells, lightheadedness -breathing problems -changes in hearing -gout pain -low blood counts - This drug may decrease the number of white blood cells, red blood cells and platelets. You may be at increased risk for infections and bleeding. -nausea and vomiting -pain, swelling, redness or irritation at the injection site -pain, tingling, numbness in the hands or feet -problems with balance, movement -trouble passing urine or change in the amount of urine Side effects that usually do not require medical attention (report to your doctor or health care professional if they continue or are bothersome): -changes in vision -loss of appetite -metallic taste in the mouth or changes in taste This list may not describe all possible side effects. Call your doctor for medical advice about side effects. You may report side effects to FDA at 1-800-FDA-1088. Where should I keep my medicine? This drug is given in a hospital or clinic and will not be stored at home. NOTE: This sheet is a summary. It may not cover all possible information. If you have questions about this medicine,  talk to your doctor, pharmacist, or health care provider.  2018 Elsevier/Gold Standard (2007-10-16 14:40:54)

## 2018-03-01 NOTE — Progress Notes (Signed)
Hematology and Oncology Follow Up Visit  AMELLIA PANIK [id number] 02/13/1956 62 y.o. 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 Doyle is back for her follow-up.  She is in getting her first dose of cis-platinum.  She started radiation.  She had a Port-A-Cath placed.  She does have iron deficiency.  She does feel fatigued.  She does feel weak.  Her iron studies that were done today showed a ferritin of 42 with an iron saturation of 14%.  She is eating a little bit better.  She is having no issues with pain.  There is no fever.  She is had no cough.  She is still smoking.  There is been no increase in leg swelling.   Overall, I would say that her performance status is ECOG 2.  Medications:  Current Outpatient Medications:  .  acetaminophen (TYLENOL) 500 MG tablet, Take 1,000 mg by mouth every 8 (eight) hours as needed for moderate pain., Disp: , Rfl:  .  amLODipine (NORVASC) 10 MG tablet, Take 10 mg by mouth daily., Disp: , Rfl:  .  dexamethasone (DECADRON) 4 MG tablet, Take 2 tablets by mouth once a day on the day after chemotherapy and then take 2 tablets two times a day for 2 days. Take with food., Disp: 30 tablet, Rfl: 1 .  diclofenac (VOLTAREN) 75 MG EC tablet, Take 1 tablet (75 mg total) by mouth 2 (two) times daily. (Patient not taking: Reported on 12/08/2017), Disp: 14 tablet, Rfl: 0 .  docusate sodium (COLACE) 100 MG capsule, Take 1 capsule (100 mg total) by mouth 2 (two) times daily., Disp: 10 capsule, Rfl: 0 .  ferrous sulfate 325 (65 FE) MG tablet, Take 1 tablet (325 mg total) by mouth 2 (two) times daily with a meal., Disp: 60 tablet, Rfl: 3 .  lidocaine-prilocaine (EMLA) cream, Apply to affected area once, Disp: 30 g,  Rfl: 3 .  LORazepam (ATIVAN) 0.5 MG tablet, Take 1 tablet (0.5 mg total) by mouth every 6 (six) hours as needed (Nausea or vomiting)., Disp: 30 tablet, Rfl: 0 .  MARINOL 2.5 MG capsule, TAKE 1 CAPSULE BY MOUTH TWICE DAILY BEFORE A MEAL, Disp: 60 capsule, Rfl: 0 .  megestrol (MEGACE ES) 625 MG/5ML suspension, Take 5 mLs (625 mg total) by mouth daily., Disp: 150 mL, Rfl: 3 .  nicotine (NICODERM CQ - DOSED IN MG/24 HOURS) 21 mg/24hr patch, Place 1 patch (21 mg total) onto the skin daily., Disp: 28 patch, Rfl: 0 .  ondansetron (ZOFRAN) 4 MG tablet, Take 1 tablet (4 mg total) by mouth daily as needed for nausea or vomiting., Disp: 30 tablet, Rfl: 1 .  ondansetron (ZOFRAN) 8 MG tablet, Take 1 tablet (8 mg total) by mouth 2 (two) times daily as needed. Start on the third day after chemotherapy., Disp: 30 tablet, Rfl: 1 .  oxandrolone (OXANDRIN) 10 MG tablet, Take 1 tablet (10 mg total) by mouth daily., Disp: 30 tablet, Rfl: 3 .  prochlorperazine (COMPAZINE) 10 MG tablet, Take 1 tablet (10 mg total) by mouth every 6 (six) hours as needed (Nausea or vomiting)., Disp: 30 tablet, Rfl: 1 .  Vitamin D, Ergocalciferol, (DRISDOL) 50000 units CAPS capsule, Take 1 capsule (50,000 Units total) by mouth every 7 (seven) days.,  Disp: 8 capsule, Rfl: 0 No current facility-administered medications for this visit.   Facility-Administered Medications Ordered in Other Visits:  .  sodium chloride flush (NS) 0.9 % injection 10 mL, 10 mL, Intracatheter, PRN, [name], MD, 10 mL at 03/01/18 1612  Allergies: No Known Allergies  Past Medical History, Surgical history, Social history, and Family History were reviewed and updated.  Review of Systems: Review of Systems  Constitutional: Negative.   HENT:  Negative.   Eyes: Negative.   Respiratory: Negative.   Cardiovascular: Negative.   Gastrointestinal: Negative.   Endocrine: Negative.   Genitourinary: Negative.    Musculoskeletal: Negative.   Skin: Negative.     Neurological: Negative.   Hematological: Negative.   Psychiatric/Behavioral: Negative.     Physical Exam:  vitals were not taken for this visit.   Wt Readings from Last 3 Encounters:  02/09/18 79 lb (35.8 kg)  12/28/17 71 lb (32.2 kg)  12/13/17 77 lb 9.6 oz (35.2 kg)    Physical Exam  Constitutional: She is oriented to person, place, and time.  HENT:  Head: Normocephalic and atraumatic.  Mouth/Throat: Oropharynx is clear and moist.  Eyes: Pupils are equal, round, and reactive to light. EOM are normal.  Neck: Normal range of motion.  Cardiovascular: Normal rate, regular rhythm and normal heart sounds.  Pulmonary/Chest: Effort normal and breath sounds normal.  Abdominal: Soft. Bowel sounds are normal.  Her abdominal exam is soft.  She has decent bowel sounds.  There might be some fullness in the hypoumbilical area.  She has no fluid wave.  There is no palpable liver or spleen tip.  Musculoskeletal: Normal range of motion. She exhibits no edema, tenderness or deformity.  Lymphadenopathy:    She has no cervical adenopathy.  Neurological: She is alert and oriented to person, place, and time.  Skin: Skin is warm and dry. No rash noted. No erythema.  Psychiatric: She has a normal mood and affect. Her behavior is normal. Judgment and thought content normal.  Vitals reviewed.    Lab Results  Component Value Date   WBC 6.7 03/01/2018   HGB 11.9 03/01/2018   HCT 36.7 03/01/2018   MCV 90.6 03/01/2018   PLT 265 03/01/2018     Chemistry      Component Value Date/Time   NA 143 03/01/2018 0851   K 4.2 03/01/2018 0851   CL 103 03/01/2018 0851   CO2 29 03/01/2018 0851   BUN 14 03/01/2018 0851   CREATININE 1.00 03/01/2018 0851   CREATININE 1.10 08/20/2013 1027      Component Value Date/Time   CALCIUM 9.4 03/01/2018 0851   ALKPHOS 89 (H) 03/01/2018 0851   AST 30 03/01/2018 0851   ALT 30 03/01/2018 0851   BILITOT 0.5 03/01/2018 0851         Impression and Plan: Ms.  Clontz is a 62 year old African-American female.  She has locally advanced high-grade urothelial carcinoma of the bladder.  I am thankful that she has gained a little bit of weight.  This, hopefully, will help her tolerate treatment better.  I do feel good about the chance of getting her into good remission.  She is not an operative candidate.  Hopefully if we can get a good response with radiation chemotherapy, she might be able to have some type of cystoscopy and removal of tumor with a TURB.  She will need IV iron.  I will set this up next week for her.  We will have to see her  weekly for right now.  She is still somewhat frail.  I spent about 40 minutes with she and her daughter.  Over three fourths the time was spent face-to-face with them counseling them and coordinating care and getting the iron set up for next week.  We will see her next week.   [name], MD 8/8/20194:54 PM

## 2018-03-01 NOTE — Patient Instructions (Signed)
Implanted Port Home Guide An implanted port is a type of central line that is placed under the skin. Central lines are used to provide IV access when treatment or nutrition needs to be given through a person's veins. Implanted ports are used for long-term IV access. An implanted port may be placed because:  You need IV medicine that would be irritating to the small veins in your hands or arms.  You need long-term IV medicines, such as antibiotics.  You need IV nutrition for a long period.  You need frequent blood draws for lab tests.  You need dialysis.  Implanted ports are usually placed in the chest area, but they can also be placed in the upper arm, the abdomen, or the leg. An implanted port has two main parts:  Reservoir. The reservoir is round and will appear as a small, raised area under your skin. The reservoir is the part where a needle is inserted to give medicines or draw blood.  Catheter. The catheter is a thin, flexible tube that extends from the reservoir. The catheter is placed into a large vein. Medicine that is inserted into the reservoir goes into the catheter and then into the vein.  How will I care for my incision site? Do not get the incision site wet. Bathe or shower as directed by your health care provider. How is my port accessed? Special steps must be taken to access the port:  Before the port is accessed, a numbing cream can be placed on the skin. This helps numb the skin over the port site.  Your health care provider uses a sterile technique to access the port. ? Your health care provider must put on a mask and sterile gloves. ? The skin over your port is cleaned carefully with an antiseptic and allowed to dry. ? The port is gently pinched between sterile gloves, and a needle is inserted into the port.  Only "non-coring" port needles should be used to access the port. Once the port is accessed, a blood return should be checked. This helps ensure that the port  is in the vein and is not clogged.  If your port needs to remain accessed for a constant infusion, a clear (transparent) bandage will be placed over the needle site. The bandage and needle will need to be changed every week, or as directed by your health care provider.  Keep the bandage covering the needle clean and dry. Do not get it wet. Follow your health care provider's instructions on how to take a shower or bath while the port is accessed.  If your port does not need to stay accessed, no bandage is needed over the port.  What is flushing? Flushing helps keep the port from getting clogged. Follow your health care provider's instructions on how and when to flush the port. Ports are usually flushed with saline solution or a medicine called heparin. The need for flushing will depend on how the port is used.  If the port is used for intermittent medicines or blood draws, the port will need to be flushed: ? After medicines have been given. ? After blood has been drawn. ? As part of routine maintenance.  If a constant infusion is running, the port may not need to be flushed.  How long will my port stay implanted? The port can stay in for as long as your health care provider thinks it is needed. When it is time for the port to come out, surgery will be   done to remove it. The procedure is similar to the one performed when the port was put in. When should I seek immediate medical care? When you have an implanted port, you should seek immediate medical care if:  You notice a bad smell coming from the incision site.  You have swelling, redness, or drainage at the incision site.  You have more swelling or pain at the port site or the surrounding area.  You have a fever that is not controlled with medicine.  This information is not intended to replace advice given to you by your health care provider. Make sure you discuss any questions you have with your health care provider. Document  Released: 07/11/2005 Document Revised: 12/17/2015 Document Reviewed: 03/18/2013 Elsevier Interactive Patient Education  2017 Elsevier Inc.  

## 2018-03-02 ENCOUNTER — Telehealth: Payer: Self-pay

## 2018-03-02 NOTE — Telephone Encounter (Signed)
Spoke to pt via phone re:1st time Cisplatin yesterday. Pt denies fatigue/nausea/bowel changes. Reports normal appetite and is eating and pushing po fluids. Pt aware of how to reach office and on call MD prn. dph

## 2018-03-05 ENCOUNTER — Encounter: Payer: Self-pay | Admitting: Hematology & Oncology

## 2018-03-08 ENCOUNTER — Inpatient Hospital Stay: Payer: Medicaid Other

## 2018-03-08 VITALS — BP 124/73 | HR 87 | Temp 98.4°F | Resp 16 | Wt 72.0 lb

## 2018-03-08 DIAGNOSIS — D500 Iron deficiency anemia secondary to blood loss (chronic): Secondary | ICD-10-CM

## 2018-03-08 DIAGNOSIS — Z5111 Encounter for antineoplastic chemotherapy: Secondary | ICD-10-CM | POA: Diagnosis not present

## 2018-03-08 DIAGNOSIS — C672 Malignant neoplasm of lateral wall of bladder: Secondary | ICD-10-CM

## 2018-03-08 LAB — CBC WITH DIFFERENTIAL (CANCER CENTER ONLY)
Basophils Absolute: 0 10*3/uL (ref 0.0–0.1)
Basophils Relative: 0 %
Eosinophils Absolute: 0.1 10*3/uL (ref 0.0–0.5)
Eosinophils Relative: 1 %
HCT: 41.3 % (ref 34.8–46.6)
Hemoglobin: 13.5 g/dL (ref 11.6–15.9)
Lymphocytes Relative: 5 %
Lymphs Abs: 0.3 10*3/uL — ABNORMAL LOW (ref 0.9–3.3)
MCH: 29.1 pg (ref 26.0–34.0)
MCHC: 32.7 g/dL (ref 32.0–36.0)
MCV: 89 fL (ref 81.0–101.0)
Monocytes Absolute: 0.9 10*3/uL (ref 0.1–0.9)
Monocytes Relative: 13 %
Neutro Abs: 5.3 10*3/uL (ref 1.5–6.5)
Neutrophils Relative %: 81 %
Platelet Count: 248 10*3/uL (ref 145–400)
RBC: 4.64 MIL/uL (ref 3.70–5.32)
RDW: 15.9 % — ABNORMAL HIGH (ref 11.1–15.7)
WBC Count: 6.6 10*3/uL (ref 3.9–10.0)

## 2018-03-08 LAB — CMP (CANCER CENTER ONLY)
ALT: 29 U/L (ref 10–47)
AST: 22 U/L (ref 11–38)
Albumin: 3.7 g/dL (ref 3.5–5.0)
Alkaline Phosphatase: 82 U/L (ref 26–84)
Anion gap: 6 (ref 5–15)
BUN: 21 mg/dL (ref 7–22)
CO2: 31 mmol/L (ref 18–33)
Calcium: 9.2 mg/dL (ref 8.0–10.3)
Chloride: 103 mmol/L (ref 98–108)
Creatinine: 1.1 mg/dL (ref 0.60–1.20)
Glucose, Bld: 101 mg/dL (ref 73–118)
Potassium: 4 mmol/L (ref 3.3–4.7)
Sodium: 140 mmol/L (ref 128–145)
Total Bilirubin: 0.7 mg/dL (ref 0.2–1.6)
Total Protein: 7.5 g/dL (ref 6.4–8.1)

## 2018-03-08 MED ORDER — LORAZEPAM 2 MG/ML IJ SOLN
INTRAMUSCULAR | Status: AC
  Filled 2018-03-08: qty 1

## 2018-03-08 MED ORDER — SODIUM CHLORIDE 0.9 % IV SOLN
510.0000 mg | Freq: Once | INTRAVENOUS | Status: AC
  Administered 2018-03-08: 510 mg via INTRAVENOUS
  Filled 2018-03-08: qty 17

## 2018-03-08 MED ORDER — POTASSIUM CHLORIDE 2 MEQ/ML IV SOLN
Freq: Once | INTRAVENOUS | Status: AC
  Administered 2018-03-08: 11:00:00 via INTRAVENOUS
  Filled 2018-03-08: qty 10

## 2018-03-08 MED ORDER — LORAZEPAM 2 MG/ML IJ SOLN
0.5000 mg | Freq: Once | INTRAMUSCULAR | Status: AC
  Administered 2018-03-08: 0.5 mg via INTRAVENOUS

## 2018-03-08 MED ORDER — PALONOSETRON HCL INJECTION 0.25 MG/5ML
0.2500 mg | Freq: Once | INTRAVENOUS | Status: AC
  Administered 2018-03-08: 0.25 mg via INTRAVENOUS

## 2018-03-08 MED ORDER — SODIUM CHLORIDE 0.9% FLUSH
10.0000 mL | INTRAVENOUS | Status: DC | PRN
  Administered 2018-03-08: 10 mL
  Filled 2018-03-08: qty 10

## 2018-03-08 MED ORDER — PALONOSETRON HCL INJECTION 0.25 MG/5ML
INTRAVENOUS | Status: AC
  Filled 2018-03-08: qty 5

## 2018-03-08 MED ORDER — HEPARIN SOD (PORK) LOCK FLUSH 100 UNIT/ML IV SOLN
500.0000 [IU] | Freq: Once | INTRAVENOUS | Status: AC | PRN
  Administered 2018-03-08: 500 [IU]
  Filled 2018-03-08: qty 5

## 2018-03-08 MED ORDER — SODIUM CHLORIDE 0.9 % IV SOLN
35.0000 mg/m2 | Freq: Once | INTRAVENOUS | Status: AC
  Administered 2018-03-08: 44 mg via INTRAVENOUS
  Filled 2018-03-08: qty 44

## 2018-03-08 MED ORDER — SODIUM CHLORIDE 0.9 % IV SOLN
Freq: Once | INTRAVENOUS | Status: AC
  Administered 2018-03-08: 13:00:00 via INTRAVENOUS
  Filled 2018-03-08: qty 5

## 2018-03-08 MED ORDER — SODIUM CHLORIDE 0.9 % IV SOLN
Freq: Once | INTRAVENOUS | Status: AC
  Administered 2018-03-08: 10:00:00 via INTRAVENOUS
  Filled 2018-03-08: qty 250

## 2018-03-08 NOTE — Patient Instructions (Signed)
Ferumoxytol injection What is this medicine? FERUMOXYTOL is an iron complex. Iron is used to make healthy red blood cells, which carry oxygen and nutrients throughout the body. This medicine is used to treat iron deficiency anemia in people with chronic kidney disease. This medicine may be used for other purposes; ask your health care provider or pharmacist if you have questions. COMMON BRAND NAME(S): Feraheme What should I tell my health care provider before I take this medicine? They need to know if you have any of these conditions: -anemia not caused by low iron levels -high levels of iron in the blood -magnetic resonance imaging (MRI) test scheduled -an unusual or allergic reaction to iron, other medicines, foods, dyes, or preservatives -pregnant or trying to get pregnant -breast-feeding How should I use this medicine? This medicine is for injection into a vein. It is given by a health care professional in a hospital or clinic setting. Talk to your pediatrician regarding the use of this medicine in children. Special care may be needed. Overdosage: If you think you have taken too much of this medicine contact a poison control center or emergency room at once. NOTE: This medicine is only for you. Do not share this medicine with others. What if I miss a dose? It is important not to miss your dose. Call your doctor or health care professional if you are unable to keep an appointment. What may interact with this medicine? This medicine may interact with the following medications: -other iron products This list may not describe all possible interactions. Give your health care provider a list of all the medicines, herbs, non-prescription drugs, or dietary supplements you use. Also tell them if you smoke, drink alcohol, or use illegal drugs. Some items may interact with your medicine. What should I watch for while using this medicine? Visit your doctor or healthcare professional regularly. Tell  your doctor or healthcare professional if your symptoms do not start to get better or if they get worse. You may need blood work done while you are taking this medicine. You may need to follow a special diet. Talk to your doctor. Foods that contain iron include: whole grains/cereals, dried fruits, beans, or peas, leafy green vegetables, and organ meats (liver, kidney). What side effects may I notice from receiving this medicine? Side effects that you should report to your doctor or health care professional as soon as possible: -allergic reactions like skin rash, itching or hives, swelling of the face, lips, or tongue -breathing problems -changes in blood pressure -feeling faint or lightheaded, falls -fever or chills -flushing, sweating, or hot feelings -swelling of the ankles or feet Side effects that usually do not require medical attention (report to your doctor or health care professional if they continue or are bothersome): -diarrhea -headache -nausea, vomiting -stomach pain This list may not describe all possible side effects. Call your doctor for medical advice about side effects. You may report side effects to FDA at 1-800-FDA-1088. Where should I keep my medicine? This drug is given in a hospital or clinic and will not be stored at home. NOTE: This sheet is a summary. It may not cover all possible information. If you have questions about this medicine, talk to your doctor, pharmacist, or health care provider.  2018 Elsevier/Gold Standard (2015-08-13 12:41:49) Cisplatin injection What is this medicine? CISPLATIN (SIS pla tin) is a chemotherapy drug. It targets fast dividing cells, like cancer cells, and causes these cells to die. This medicine is used to treat many types  of cancer like bladder, ovarian, and testicular cancers. This medicine may be used for other purposes; ask your health care provider or pharmacist if you have questions. COMMON BRAND NAME(S): Platinol, Platinol  -AQ What should I tell my health care provider before I take this medicine? They need to know if you have any of these conditions: -blood disorders -hearing problems -kidney disease -recent or ongoing radiation therapy -an unusual or allergic reaction to cisplatin, carboplatin, other chemotherapy, other medicines, foods, dyes, or preservatives -pregnant or trying to get pregnant -breast-feeding How should I use this medicine? This drug is given as an infusion into a vein. It is administered in a hospital or clinic by a specially trained health care professional. Talk to your pediatrician regarding the use of this medicine in children. Special care may be needed. Overdosage: If you think you have taken too much of this medicine contact a poison control center or emergency room at once. NOTE: This medicine is only for you. Do not share this medicine with others. What if I miss a dose? It is important not to miss a dose. Call your doctor or health care professional if you are unable to keep an appointment. What may interact with this medicine? -dofetilide -foscarnet -medicines for seizures -medicines to increase blood counts like filgrastim, pegfilgrastim, sargramostim -probenecid -pyridoxine used with altretamine -rituximab -some antibiotics like amikacin, gentamicin, neomycin, polymyxin B, streptomycin, tobramycin -sulfinpyrazone -vaccines -zalcitabine Talk to your doctor or health care professional before taking any of these medicines: -acetaminophen -aspirin -ibuprofen -ketoprofen -naproxen This list may not describe all possible interactions. Give your health care provider a list of all the medicines, herbs, non-prescription drugs, or dietary supplements you use. Also tell them if you smoke, drink alcohol, or use illegal drugs. Some items may interact with your medicine. What should I watch for while using this medicine? Your condition will be monitored carefully while you are  receiving this medicine. You will need important blood work done while you are taking this medicine. This drug may make you feel generally unwell. This is not uncommon, as chemotherapy can affect healthy cells as well as cancer cells. Report any side effects. Continue your course of treatment even though you feel ill unless your doctor tells you to stop. In some cases, you may be given additional medicines to help with side effects. Follow all directions for their use. Call your doctor or health care professional for advice if you get a fever, chills or sore throat, or other symptoms of a cold or flu. Do not treat yourself. This drug decreases your body's ability to fight infections. Try to avoid being around people who are sick. This medicine may increase your risk to bruise or bleed. Call your doctor or health care professional if you notice any unusual bleeding. Be careful brushing and flossing your teeth or using a toothpick because you may get an infection or bleed more easily. If you have any dental work done, tell your dentist you are receiving this medicine. Avoid taking products that contain aspirin, acetaminophen, ibuprofen, naproxen, or ketoprofen unless instructed by your doctor. These medicines may hide a fever. Do not become pregnant while taking this medicine. Women should inform their doctor if they wish to become pregnant or think they might be pregnant. There is a potential for serious side effects to an unborn child. Talk to your health care professional or pharmacist for more information. Do not breast-feed an infant while taking this medicine. Drink fluids as directed while you  are taking this medicine. This will help protect your kidneys. Call your doctor or health care professional if you get diarrhea. Do not treat yourself. What side effects may I notice from receiving this medicine? Side effects that you should report to your doctor or health care professional as soon as  possible: -allergic reactions like skin rash, itching or hives, swelling of the face, lips, or tongue -signs of infection - fever or chills, cough, sore throat, pain or difficulty passing urine -signs of decreased platelets or bleeding - bruising, pinpoint red spots on the skin, black, tarry stools, nosebleeds -signs of decreased red blood cells - unusually weak or tired, fainting spells, lightheadedness -breathing problems -changes in hearing -gout pain -low blood counts - This drug may decrease the number of white blood cells, red blood cells and platelets. You may be at increased risk for infections and bleeding. -nausea and vomiting -pain, swelling, redness or irritation at the injection site -pain, tingling, numbness in the hands or feet -problems with balance, movement -trouble passing urine or change in the amount of urine Side effects that usually do not require medical attention (report to your doctor or health care professional if they continue or are bothersome): -changes in vision -loss of appetite -metallic taste in the mouth or changes in taste This list may not describe all possible side effects. Call your doctor for medical advice about side effects. You may report side effects to FDA at 1-800-FDA-1088. Where should I keep my medicine? This drug is given in a hospital or clinic and will not be stored at home. NOTE: This sheet is a summary. It may not cover all possible information. If you have questions about this medicine, talk to your doctor, pharmacist, or health care provider.  2018 Elsevier/Gold Standard (2007-10-16 14:40:54)

## 2018-03-08 NOTE — Patient Instructions (Signed)
Implanted Port Home Guide An implanted port is a type of central line that is placed under the skin. Central lines are used to provide IV access when treatment or nutrition needs to be given through a person's veins. Implanted ports are used for long-term IV access. An implanted port may be placed because:  You need IV medicine that would be irritating to the small veins in your hands or arms.  You need long-term IV medicines, such as antibiotics.  You need IV nutrition for a long period.  You need frequent blood draws for lab tests.  You need dialysis.  Implanted ports are usually placed in the chest area, but they can also be placed in the upper arm, the abdomen, or the leg. An implanted port has two main parts:  Reservoir. The reservoir is round and will appear as a small, raised area under your skin. The reservoir is the part where a needle is inserted to give medicines or draw blood.  Catheter. The catheter is a thin, flexible tube that extends from the reservoir. The catheter is placed into a large vein. Medicine that is inserted into the reservoir goes into the catheter and then into the vein.  How will I care for my incision site? Do not get the incision site wet. Bathe or shower as directed by your health care provider. How is my port accessed? Special steps must be taken to access the port:  Before the port is accessed, a numbing cream can be placed on the skin. This helps numb the skin over the port site.  Your health care provider uses a sterile technique to access the port. ? Your health care provider must put on a mask and sterile gloves. ? The skin over your port is cleaned carefully with an antiseptic and allowed to dry. ? The port is gently pinched between sterile gloves, and a needle is inserted into the port.  Only "non-coring" port needles should be used to access the port. Once the port is accessed, a blood return should be checked. This helps ensure that the port  is in the vein and is not clogged.  If your port needs to remain accessed for a constant infusion, a clear (transparent) bandage will be placed over the needle site. The bandage and needle will need to be changed every week, or as directed by your health care provider.  Keep the bandage covering the needle clean and dry. Do not get it wet. Follow your health care provider's instructions on how to take a shower or bath while the port is accessed.  If your port does not need to stay accessed, no bandage is needed over the port.  What is flushing? Flushing helps keep the port from getting clogged. Follow your health care provider's instructions on how and when to flush the port. Ports are usually flushed with saline solution or a medicine called heparin. The need for flushing will depend on how the port is used.  If the port is used for intermittent medicines or blood draws, the port will need to be flushed: ? After medicines have been given. ? After blood has been drawn. ? As part of routine maintenance.  If a constant infusion is running, the port may not need to be flushed.  How long will my port stay implanted? The port can stay in for as long as your health care provider thinks it is needed. When it is time for the port to come out, surgery will be   done to remove it. The procedure is similar to the one performed when the port was put in. When should I seek immediate medical care? When you have an implanted port, you should seek immediate medical care if:  You notice a bad smell coming from the incision site.  You have swelling, redness, or drainage at the incision site.  You have more swelling or pain at the port site or the surrounding area.  You have a fever that is not controlled with medicine.  This information is not intended to replace advice given to you by your health care provider. Make sure you discuss any questions you have with your health care provider. Document  Released: 07/11/2005 Document Revised: 12/17/2015 Document Reviewed: 03/18/2013 Elsevier Interactive Patient Education  2017 Elsevier Inc.  

## 2018-03-08 NOTE — Progress Notes (Signed)
Patient had 150cc of measured urine and 2 episodes of stool with unmeasured urine. OK to proceed with cisplatin per Dr Marin Olp. dph

## 2018-03-15 ENCOUNTER — Inpatient Hospital Stay: Payer: Medicaid Other

## 2018-03-15 DIAGNOSIS — Z5111 Encounter for antineoplastic chemotherapy: Secondary | ICD-10-CM | POA: Diagnosis not present

## 2018-03-15 DIAGNOSIS — C672 Malignant neoplasm of lateral wall of bladder: Secondary | ICD-10-CM

## 2018-03-15 DIAGNOSIS — D500 Iron deficiency anemia secondary to blood loss (chronic): Secondary | ICD-10-CM

## 2018-03-15 LAB — CMP (CANCER CENTER ONLY)
ALT: 15 U/L (ref 10–47)
AST: 17 U/L (ref 11–38)
Albumin: 2.9 g/dL — ABNORMAL LOW (ref 3.5–5.0)
Alkaline Phosphatase: 71 U/L (ref 26–84)
Anion gap: 6 (ref 5–15)
BUN: 12 mg/dL (ref 7–22)
CO2: 32 mmol/L (ref 18–33)
Calcium: 8.9 mg/dL (ref 8.0–10.3)
Chloride: 105 mmol/L (ref 98–108)
Creatinine: 0.7 mg/dL (ref 0.60–1.20)
Glucose, Bld: 103 mg/dL (ref 73–118)
Potassium: 4 mmol/L (ref 3.3–4.7)
Sodium: 143 mmol/L (ref 128–145)
Total Bilirubin: 0.4 mg/dL (ref 0.2–1.6)
Total Protein: 6.7 g/dL (ref 6.4–8.1)

## 2018-03-15 LAB — CBC WITH DIFFERENTIAL (CANCER CENTER ONLY)
Basophils Absolute: 0 10*3/uL (ref 0.0–0.1)
Basophils Relative: 0 %
Eosinophils Absolute: 0.2 10*3/uL (ref 0.0–0.5)
Eosinophils Relative: 3 %
HCT: 33.1 % — ABNORMAL LOW (ref 34.8–46.6)
Hemoglobin: 10.8 g/dL — ABNORMAL LOW (ref 11.6–15.9)
Lymphocytes Relative: 4 %
Lymphs Abs: 0.4 10*3/uL — ABNORMAL LOW (ref 0.9–3.3)
MCH: 29.8 pg (ref 26.0–34.0)
MCHC: 32.6 g/dL (ref 32.0–36.0)
MCV: 91.4 fL (ref 81.0–101.0)
Monocytes Absolute: 0.7 10*3/uL (ref 0.1–0.9)
Monocytes Relative: 8 %
Neutro Abs: 7.5 10*3/uL — ABNORMAL HIGH (ref 1.5–6.5)
Neutrophils Relative %: 85 %
Platelet Count: 153 10*3/uL (ref 145–400)
RBC: 3.62 MIL/uL — ABNORMAL LOW (ref 3.70–5.32)
RDW: 15.5 % (ref 11.1–15.7)
WBC Count: 8.8 10*3/uL (ref 3.9–10.0)

## 2018-03-15 LAB — MAGNESIUM: Magnesium: 1.8 mg/dL (ref 1.7–2.4)

## 2018-03-15 MED ORDER — HEPARIN SOD (PORK) LOCK FLUSH 100 UNIT/ML IV SOLN
500.0000 [IU] | Freq: Once | INTRAVENOUS | Status: AC | PRN
  Administered 2018-03-15: 500 [IU]
  Filled 2018-03-15: qty 5

## 2018-03-15 MED ORDER — SODIUM CHLORIDE 0.9 % IV SOLN
35.0000 mg/m2 | Freq: Once | INTRAVENOUS | Status: AC
  Administered 2018-03-15: 44 mg via INTRAVENOUS
  Filled 2018-03-15: qty 44

## 2018-03-15 MED ORDER — SODIUM CHLORIDE 0.9 % IV SOLN
510.0000 mg | Freq: Once | INTRAVENOUS | Status: AC
  Administered 2018-03-15: 510 mg via INTRAVENOUS
  Filled 2018-03-15: qty 17

## 2018-03-15 MED ORDER — SODIUM CHLORIDE 0.9 % IV SOLN
Freq: Once | INTRAVENOUS | Status: AC
  Administered 2018-03-15: 11:00:00 via INTRAVENOUS
  Filled 2018-03-15: qty 5

## 2018-03-15 MED ORDER — PALONOSETRON HCL INJECTION 0.25 MG/5ML
0.2500 mg | Freq: Once | INTRAVENOUS | Status: AC
  Administered 2018-03-15: 0.25 mg via INTRAVENOUS

## 2018-03-15 MED ORDER — SODIUM CHLORIDE 0.9 % IV SOLN
Freq: Once | INTRAVENOUS | Status: AC
  Filled 2018-03-15: qty 250

## 2018-03-15 MED ORDER — SODIUM CHLORIDE 0.9 % IV SOLN
Freq: Once | INTRAVENOUS | Status: AC
  Administered 2018-03-15: 09:00:00 via INTRAVENOUS
  Filled 2018-03-15: qty 250

## 2018-03-15 MED ORDER — SODIUM CHLORIDE 0.9% FLUSH
10.0000 mL | INTRAVENOUS | Status: DC | PRN
  Administered 2018-03-15: 10 mL
  Filled 2018-03-15: qty 10

## 2018-03-15 MED ORDER — POTASSIUM CHLORIDE 2 MEQ/ML IV SOLN
Freq: Once | INTRAVENOUS | Status: AC
  Administered 2018-03-15: 09:00:00 via INTRAVENOUS
  Filled 2018-03-15: qty 10

## 2018-03-15 MED ORDER — PALONOSETRON HCL INJECTION 0.25 MG/5ML
INTRAVENOUS | Status: AC
  Filled 2018-03-15: qty 5

## 2018-03-15 NOTE — Progress Notes (Signed)
Patient had 100cc measured urinary output + 2 urine occurrences unmeasured. Per Dr Marin Olp, ok to continue with Cisplatin. Pt re-educated on need to measure output. dph

## 2018-03-15 NOTE — Patient Instructions (Signed)
Implanted Port Home Guide An implanted port is a type of central line that is placed under the skin. Central lines are used to provide IV access when treatment or nutrition needs to be given through a person's veins. Implanted ports are used for long-term IV access. An implanted port may be placed because:  You need IV medicine that would be irritating to the small veins in your hands or arms.  You need long-term IV medicines, such as antibiotics.  You need IV nutrition for a long period.  You need frequent blood draws for lab tests.  You need dialysis.  Implanted ports are usually placed in the chest area, but they can also be placed in the upper arm, the abdomen, or the leg. An implanted port has two main parts:  Reservoir. The reservoir is round and will appear as a small, raised area under your skin. The reservoir is the part where a needle is inserted to give medicines or draw blood.  Catheter. The catheter is a thin, flexible tube that extends from the reservoir. The catheter is placed into a large vein. Medicine that is inserted into the reservoir goes into the catheter and then into the vein.  How will I care for my incision site? Do not get the incision site wet. Bathe or shower as directed by your health care provider. How is my port accessed? Special steps must be taken to access the port:  Before the port is accessed, a numbing cream can be placed on the skin. This helps numb the skin over the port site.  Your health care provider uses a sterile technique to access the port. ? Your health care provider must put on a mask and sterile gloves. ? The skin over your port is cleaned carefully with an antiseptic and allowed to dry. ? The port is gently pinched between sterile gloves, and a needle is inserted into the port.  Only "non-coring" port needles should be used to access the port. Once the port is accessed, a blood return should be checked. This helps ensure that the port  is in the vein and is not clogged.  If your port needs to remain accessed for a constant infusion, a clear (transparent) bandage will be placed over the needle site. The bandage and needle will need to be changed every week, or as directed by your health care provider.  Keep the bandage covering the needle clean and dry. Do not get it wet. Follow your health care provider's instructions on how to take a shower or bath while the port is accessed.  If your port does not need to stay accessed, no bandage is needed over the port.  What is flushing? Flushing helps keep the port from getting clogged. Follow your health care provider's instructions on how and when to flush the port. Ports are usually flushed with saline solution or a medicine called heparin. The need for flushing will depend on how the port is used.  If the port is used for intermittent medicines or blood draws, the port will need to be flushed: ? After medicines have been given. ? After blood has been drawn. ? As part of routine maintenance.  If a constant infusion is running, the port may not need to be flushed.  How long will my port stay implanted? The port can stay in for as long as your health care provider thinks it is needed. When it is time for the port to come out, surgery will be   done to remove it. The procedure is similar to the one performed when the port was put in. When should I seek immediate medical care? When you have an implanted port, you should seek immediate medical care if:  You notice a bad smell coming from the incision site.  You have swelling, redness, or drainage at the incision site.  You have more swelling or pain at the port site or the surrounding area.  You have a fever that is not controlled with medicine.  This information is not intended to replace advice given to you by your health care provider. Make sure you discuss any questions you have with your health care provider. Document  Released: 07/11/2005 Document Revised: 12/17/2015 Document Reviewed: 03/18/2013 Elsevier Interactive Patient Education  2017 Elsevier Inc.  

## 2018-03-15 NOTE — Patient Instructions (Signed)
Cisplatin injection What is this medicine? CISPLATIN (SIS pla tin) is a chemotherapy drug. It targets fast dividing cells, like cancer cells, and causes these cells to die. This medicine is used to treat many types of cancer like bladder, ovarian, and testicular cancers. This medicine may be used for other purposes; ask your health care provider or pharmacist if you have questions. COMMON BRAND NAME(S): Platinol, Platinol -AQ What should I tell my health care provider before I take this medicine? They need to know if you have any of these conditions: -blood disorders -hearing problems -kidney disease -recent or ongoing radiation therapy -an unusual or allergic reaction to cisplatin, carboplatin, other chemotherapy, other medicines, foods, dyes, or preservatives -pregnant or trying to get pregnant -breast-feeding How should I use this medicine? This drug is given as an infusion into a vein. It is administered in a hospital or clinic by a specially trained health care professional. Talk to your pediatrician regarding the use of this medicine in children. Special care may be needed. Overdosage: If you think you have taken too much of this medicine contact a poison control center or emergency room at once. NOTE: This medicine is only for you. Do not share this medicine with others. What if I miss a dose? It is important not to miss a dose. Call your doctor or health care professional if you are unable to keep an appointment. What may interact with this medicine? -dofetilide -foscarnet -medicines for seizures -medicines to increase blood counts like filgrastim, pegfilgrastim, sargramostim -probenecid -pyridoxine used with altretamine -rituximab -some antibiotics like amikacin, gentamicin, neomycin, polymyxin B, streptomycin, tobramycin -sulfinpyrazone -vaccines -zalcitabine Talk to your doctor or health care professional before taking any of these  medicines: -acetaminophen -aspirin -ibuprofen -ketoprofen -naproxen This list may not describe all possible interactions. Give your health care provider a list of all the medicines, herbs, non-prescription drugs, or dietary supplements you use. Also tell them if you smoke, drink alcohol, or use illegal drugs. Some items may interact with your medicine. What should I watch for while using this medicine? Your condition will be monitored carefully while you are receiving this medicine. You will need important blood work done while you are taking this medicine. This drug may make you feel generally unwell. This is not uncommon, as chemotherapy can affect healthy cells as well as cancer cells. Report any side effects. Continue your course of treatment even though you feel ill unless your doctor tells you to stop. In some cases, you may be given additional medicines to help with side effects. Follow all directions for their use. Call your doctor or health care professional for advice if you get a fever, chills or sore throat, or other symptoms of a cold or flu. Do not treat yourself. This drug decreases your body's ability to fight infections. Try to avoid being around people who are sick. This medicine may increase your risk to bruise or bleed. Call your doctor or health care professional if you notice any unusual bleeding. Be careful brushing and flossing your teeth or using a toothpick because you may get an infection or bleed more easily. If you have any dental work done, tell your dentist you are receiving this medicine. Avoid taking products that contain aspirin, acetaminophen, ibuprofen, naproxen, or ketoprofen unless instructed by your doctor. These medicines may hide a fever. Do not become pregnant while taking this medicine. Women should inform their doctor if they wish to become pregnant or think they might be pregnant. There is a   potential for serious side effects to an unborn child. Talk to  your health care professional or pharmacist for more information. Do not breast-feed an infant while taking this medicine. Drink fluids as directed while you are taking this medicine. This will help protect your kidneys. Call your doctor or health care professional if you get diarrhea. Do not treat yourself. What side effects may I notice from receiving this medicine? Side effects that you should report to your doctor or health care professional as soon as possible: -allergic reactions like skin rash, itching or hives, swelling of the face, lips, or tongue -signs of infection - fever or chills, cough, sore throat, pain or difficulty passing urine -signs of decreased platelets or bleeding - bruising, pinpoint red spots on the skin, black, tarry stools, nosebleeds -signs of decreased red blood cells - unusually weak or tired, fainting spells, lightheadedness -breathing problems -changes in hearing -gout pain -low blood counts - This drug may decrease the number of white blood cells, red blood cells and platelets. You may be at increased risk for infections and bleeding. -nausea and vomiting -pain, swelling, redness or irritation at the injection site -pain, tingling, numbness in the hands or feet -problems with balance, movement -trouble passing urine or change in the amount of urine Side effects that usually do not require medical attention (report to your doctor or health care professional if they continue or are bothersome): -changes in vision -loss of appetite -metallic taste in the mouth or changes in taste This list may not describe all possible side effects. Call your doctor for medical advice about side effects. You may report side effects to FDA at 1-800-FDA-1088. Where should I keep my medicine? This drug is given in a hospital or clinic and will not be stored at home. NOTE: This sheet is a summary. It may not cover all possible information. If you have questions about this medicine,  talk to your doctor, pharmacist, or health care provider.  2018 Elsevier/Gold Standard (2007-10-16 14:40:54)

## 2018-03-22 ENCOUNTER — Other Ambulatory Visit (HOSPITAL_COMMUNITY): Payer: Self-pay | Admitting: Pharmacist

## 2018-03-22 ENCOUNTER — Inpatient Hospital Stay: Payer: Medicaid Other

## 2018-03-22 ENCOUNTER — Encounter: Payer: Self-pay | Admitting: Hematology & Oncology

## 2018-03-22 ENCOUNTER — Inpatient Hospital Stay (HOSPITAL_BASED_OUTPATIENT_CLINIC_OR_DEPARTMENT_OTHER): Payer: Medicaid Other | Admitting: Hematology & Oncology

## 2018-03-22 ENCOUNTER — Other Ambulatory Visit: Payer: Self-pay

## 2018-03-22 VITALS — BP 141/85 | HR 102 | Temp 98.5°F | Resp 19 | Wt 77.0 lb

## 2018-03-22 DIAGNOSIS — D500 Iron deficiency anemia secondary to blood loss (chronic): Secondary | ICD-10-CM | POA: Diagnosis not present

## 2018-03-22 DIAGNOSIS — R0602 Shortness of breath: Secondary | ICD-10-CM | POA: Diagnosis not present

## 2018-03-22 DIAGNOSIS — C679 Malignant neoplasm of bladder, unspecified: Secondary | ICD-10-CM

## 2018-03-22 DIAGNOSIS — C672 Malignant neoplasm of lateral wall of bladder: Secondary | ICD-10-CM

## 2018-03-22 DIAGNOSIS — F17200 Nicotine dependence, unspecified, uncomplicated: Secondary | ICD-10-CM

## 2018-03-22 DIAGNOSIS — C670 Malignant neoplasm of trigone of bladder: Secondary | ICD-10-CM

## 2018-03-22 DIAGNOSIS — Z5111 Encounter for antineoplastic chemotherapy: Secondary | ICD-10-CM | POA: Diagnosis not present

## 2018-03-22 LAB — CBC WITH DIFFERENTIAL (CANCER CENTER ONLY)
Basophils Absolute: 0 10*3/uL (ref 0.0–0.1)
Basophils Relative: 0 %
Eosinophils Absolute: 0.1 10*3/uL (ref 0.0–0.7)
Eosinophils Relative: 1 %
HCT: 34.7 % — ABNORMAL LOW (ref 36.0–46.0)
Hemoglobin: 11.4 g/dL — ABNORMAL LOW (ref 11.6–15.9)
Lymphocytes Relative: 6 %
Lymphs Abs: 0.3 10*3/uL — ABNORMAL LOW (ref 0.7–4.0)
MCH: 29.9 pg (ref 26.0–34.0)
MCHC: 32.9 g/dL (ref 30.0–36.0)
MCV: 91.1 fL (ref 78.0–100.0)
Monocytes Absolute: 0.2 10*3/uL (ref 0.1–1.0)
Monocytes Relative: 6 %
Neutro Abs: 3.8 10*3/uL (ref 1.7–7.7)
Neutrophils Relative %: 87 %
Platelet Count: 164 10*3/uL (ref 145–400)
RBC: 3.81 MIL/uL — ABNORMAL LOW (ref 3.87–5.11)
RDW: 16.8 % — ABNORMAL HIGH (ref 11.5–15.5)
WBC Count: 4.4 10*3/uL (ref 3.9–10.0)

## 2018-03-22 LAB — BASIC METABOLIC PANEL - CANCER CENTER ONLY
Anion gap: 8 (ref 5–15)
BUN: 12 mg/dL (ref 7–22)
CO2: 29 mmol/L (ref 18–33)
Calcium: 9.2 mg/dL (ref 8.0–10.3)
Chloride: 105 mmol/L (ref 98–108)
Creatinine: 0.7 mg/dL (ref 0.60–1.20)
Glucose, Bld: 166 mg/dL — ABNORMAL HIGH (ref 73–118)
Potassium: 4.4 mmol/L (ref 3.3–4.7)
Sodium: 142 mmol/L (ref 128–145)

## 2018-03-22 MED ORDER — IPRATROPIUM-ALBUTEROL 0.5-2.5 (3) MG/3ML IN SOLN
RESPIRATORY_TRACT | Status: AC
  Filled 2018-03-22: qty 3

## 2018-03-22 MED ORDER — HEPARIN SOD (PORK) LOCK FLUSH 100 UNIT/ML IV SOLN
500.0000 [IU] | Freq: Once | INTRAVENOUS | Status: AC | PRN
  Administered 2018-03-22: 500 [IU]
  Filled 2018-03-22: qty 5

## 2018-03-22 MED ORDER — SODIUM CHLORIDE 0.9 % IV SOLN
35.0000 mg/m2 | Freq: Once | INTRAVENOUS | Status: AC
  Administered 2018-03-22: 44 mg via INTRAVENOUS
  Filled 2018-03-22: qty 44

## 2018-03-22 MED ORDER — POTASSIUM CHLORIDE 2 MEQ/ML IV SOLN
Freq: Once | INTRAVENOUS | Status: AC
  Administered 2018-03-22: 11:00:00 via INTRAVENOUS
  Filled 2018-03-22: qty 10

## 2018-03-22 MED ORDER — PALONOSETRON HCL INJECTION 0.25 MG/5ML
INTRAVENOUS | Status: AC
  Filled 2018-03-22: qty 5

## 2018-03-22 MED ORDER — PALONOSETRON HCL INJECTION 0.25 MG/5ML
0.2500 mg | Freq: Once | INTRAVENOUS | Status: AC
  Administered 2018-03-22: 0.25 mg via INTRAVENOUS

## 2018-03-22 MED ORDER — SODIUM CHLORIDE 0.9 % IV SOLN
Freq: Once | INTRAVENOUS | Status: AC
  Administered 2018-03-22: 11:00:00 via INTRAVENOUS
  Filled 2018-03-22: qty 250

## 2018-03-22 MED ORDER — IPRATROPIUM-ALBUTEROL 20-100 MCG/ACT IN AERS: 1.0000 | INHALATION_SPRAY | Freq: Four times a day (QID) | RESPIRATORY_TRACT | 3 refills | Status: DC

## 2018-03-22 MED ORDER — SODIUM CHLORIDE 0.9 % IV SOLN
INTRAVENOUS | Status: DC
  Administered 2018-03-22: 13:00:00 via INTRAVENOUS
  Filled 2018-03-22: qty 250

## 2018-03-22 MED ORDER — SODIUM CHLORIDE 0.9 % IV SOLN
Freq: Once | INTRAVENOUS | Status: AC
  Administered 2018-03-22: 13:00:00 via INTRAVENOUS
  Filled 2018-03-22: qty 5

## 2018-03-22 MED ORDER — SODIUM CHLORIDE 0.9% FLUSH
10.0000 mL | INTRAVENOUS | Status: DC | PRN
  Administered 2018-03-22: 10 mL
  Filled 2018-03-22: qty 10

## 2018-03-22 MED ORDER — DOXEPIN HCL 3 MG PO TABS: 3.0000 mg | ORAL_TABLET | Freq: Every day | ORAL | 2 refills | Status: DC

## 2018-03-22 MED ORDER — IPRATROPIUM-ALBUTEROL 0.5-2.5 (3) MG/3ML IN SOLN
3.0000 mL | Freq: Once | RESPIRATORY_TRACT | Status: AC
  Administered 2018-03-22: 3 mL via RESPIRATORY_TRACT

## 2018-03-22 NOTE — Patient Instructions (Signed)
[facility] Discharge Instructions for Patients Receiving Chemotherapy  Today you received the following chemotherapy agents:  Cisplatin  To help prevent nausea and vomiting after your treatment, we encourage you to take your nausea medication as ordered per MD.    If you develop nausea and vomiting that is not controlled by your nausea medication, call the clinic.   BELOW ARE SYMPTOMS THAT SHOULD BE REPORTED IMMEDIATELY:  *FEVER GREATER THAN 100.5 F  *CHILLS WITH OR WITHOUT FEVER  NAUSEA AND VOMITING THAT IS NOT CONTROLLED WITH YOUR NAUSEA MEDICATION  *UNUSUAL SHORTNESS OF BREATH  *UNUSUAL BRUISING OR BLEEDING  TENDERNESS IN MOUTH AND THROAT WITH OR WITHOUT PRESENCE OF ULCERS  *URINARY PROBLEMS  *BOWEL PROBLEMS  UNUSUAL RASH Items with * indicate a potential emergency and should be followed up as soon as possible.  Feel free to call the clinic should you have any questions or concerns. The clinic phone number is [phone].  Please show the [facility] at check-in to the Emergency Department and triage nurse.

## 2018-03-22 NOTE — Progress Notes (Signed)
Pre hydration given two hours prior to cisplatin and two hours post cisplatin.

## 2018-03-22 NOTE — Progress Notes (Signed)
Hematology and Oncology Follow Up Visit  Donna Curry [id number] 1956-06-27 62 y.o. 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  She is doing okay.  She is having some shortness of breath.  She was seen by radiation oncology.  They apparently did a chest x-ray.  The patient and her daughter both state that everything looked okay.  No other intervention was taken.  I will try her on a nebulizer.  The nebulizer helps, then I will see about doing an inhaler.  She is holding her weight relatively well.  She said that she did get up to 80 pounds.  This is a good weight for her.  He denies have her get higher.  Para she is still smoking a little bit.  So far, I think she is done very well with chemotherapy.  She is had no problems with diarrhea.  She has had no hematuria.  There is been occasional dysuria.  She has had no leg swelling.  She has had no rashes.  She has had no fever.  There is been no mouth sores.  Overall, her performance status is ECOG 2.      Medications:  Current Outpatient Medications:  .  acetaminophen (TYLENOL) 500 MG tablet, Take 1,000 mg by mouth every 8 (eight) hours as needed for moderate pain., Disp: , Rfl:  .  amLODipine (NORVASC) 10 MG tablet, Take 10 mg by mouth daily., Disp: , Rfl:  .  dexamethasone (DECADRON) 4 MG tablet, Take 2 tablets by mouth once a day on the day after chemotherapy and then take 2 tablets two times a day for 2 days. Take with food., Disp: 30 tablet, Rfl: 1 .  diclofenac (VOLTAREN) 75 MG EC tablet, Take 1 tablet (75 mg total) by mouth 2 (two) times daily. (Patient not taking: Reported on 12/08/2017), Disp: 14 tablet, Rfl: 0 .  docusate sodium (COLACE)  100 MG capsule, Take 1 capsule (100 mg total) by mouth 2 (two) times daily., Disp: 10 capsule, Rfl: 0 .  ferrous sulfate 325 (65 FE) MG tablet, Take 1 tablet (325 mg total) by mouth 2 (two) times daily with a meal., Disp: 60 tablet, Rfl: 3 .  lidocaine-prilocaine (EMLA) cream, Apply to affected area once, Disp: 30 g, Rfl: 3 .  LORazepam (ATIVAN) 0.5 MG tablet, Take 1 tablet (0.5 mg total) by mouth every 6 (six) hours as needed (Nausea or vomiting)., Disp: 30 tablet, Rfl: 0 .  MARINOL 2.5 MG capsule, TAKE 1 CAPSULE BY MOUTH TWICE DAILY BEFORE A MEAL, Disp: 60 capsule, Rfl: 0 .  megestrol (MEGACE ES) 625 MG/5ML suspension, Take 5 mLs (625 mg total) by mouth daily., Disp: 150 mL, Rfl: 3 .  nicotine (NICODERM CQ - DOSED IN MG/24 HOURS) 21 mg/24hr patch, Place 1 patch (21 mg total) onto the skin daily., Disp: 28 patch, Rfl: 0 .  ondansetron (ZOFRAN) 4 MG tablet, Take 1 tablet (4 mg total) by mouth daily as needed for nausea or vomiting., Disp: 30 tablet, Rfl: 1 .  ondansetron (ZOFRAN) 8 MG tablet, Take 1 tablet (8 mg total) by mouth 2 (two) times daily as needed. Start on the third day after chemotherapy., Disp: 30 tablet, Rfl: 1 .  oxandrolone (  OXANDRIN) 10 MG tablet, Take 1 tablet (10 mg total) by mouth daily., Disp: 30 tablet, Rfl: 3 .  prochlorperazine (COMPAZINE) 10 MG tablet, Take 1 tablet (10 mg total) by mouth every 6 (six) hours as needed (Nausea or vomiting)., Disp: 30 tablet, Rfl: 1 .  Vitamin D, Ergocalciferol, (DRISDOL) 50000 units CAPS capsule, Take 1 capsule (50,000 Units total) by mouth every 7 (seven) days., Disp: 8 capsule, Rfl: 0  Allergies: No Known Allergies  Past Medical History, Surgical history, Social history, and Family History were reviewed and updated.  Review of Systems: Review of Systems  Constitutional: Negative.   HENT:  Negative.   Eyes: Negative.   Respiratory: Negative.   Cardiovascular: Negative.   Gastrointestinal: Negative.   Endocrine: Negative.     Genitourinary: Negative.    Musculoskeletal: Negative.   Skin: Negative.   Neurological: Negative.   Hematological: Negative.   Psychiatric/Behavioral: Negative.     Physical Exam:  weight is 77 lb (34.9 kg). Her oral temperature is 98.5 F (36.9 C). Her blood pressure is 141/85 (abnormal) and her pulse is 102 (abnormal). Her respiration is 19 and oxygen saturation is 100%.   Wt Readings from Last 3 Encounters:  03/22/18 77 lb (34.9 kg)  03/08/18 72 lb (32.7 kg)  02/09/18 79 lb (35.8 kg)    Physical Exam  Constitutional: She is oriented to person, place, and time.  HENT:  Head: Normocephalic and atraumatic.  Mouth/Throat: Oropharynx is clear and moist.  Eyes: Pupils are equal, round, and reactive to light. EOM are normal.  Neck: Normal range of motion.  Cardiovascular: Normal rate, regular rhythm and normal heart sounds.  Pulmonary/Chest: Effort normal and breath sounds normal.  Abdominal: Soft. Bowel sounds are normal.  Her abdominal exam is soft.  She has decent bowel sounds.  There might be some fullness in the hypoumbilical area.  She has no fluid wave.  There is no palpable liver or spleen tip.  Musculoskeletal: Normal range of motion. She exhibits no edema, tenderness or deformity.  Lymphadenopathy:    She has no cervical adenopathy.  Neurological: She is alert and oriented to person, place, and time.  Skin: Skin is warm and dry. No rash noted. No erythema.  Psychiatric: She has a normal mood and affect. Her behavior is normal. Judgment and thought content normal.  Vitals reviewed.    Lab Results  Component Value Date   WBC PENDING 03/22/2018   HGB PENDING 03/22/2018   HCT 34.7 (L) 03/22/2018   MCV 91.1 03/22/2018   PLT PENDING 03/22/2018     Chemistry      Component Value Date/Time   NA 143 03/15/2018 0904   K 4.0 03/15/2018 0904   CL 105 03/15/2018 0904   CO2 32 03/15/2018 0904   BUN 12 03/15/2018 0904   CREATININE 0.70 03/15/2018 0904   CREATININE  1.10 08/20/2013 1027      Component Value Date/Time   CALCIUM 8.9 03/15/2018 0904   ALKPHOS 71 03/15/2018 0904   AST 17 03/15/2018 0904   ALT 15 03/15/2018 0904   BILITOT 0.4 03/15/2018 0904         Impression and Plan: Donna Curry is a 62 year old African-American female.  She has locally advanced high-grade urothelial carcinoma of the bladder.  We will go ahead with treatment.  I really want to try to keep her on treatment given that this is a nonmetastatic bladder cancer and that her radiation with concurrent chemotherapy I think really will help.  Again  we will had to see about the breathing.  I will put her on a nebulizer.  We will see if this can help her.  I am watching her weight very closely.  We have to have her come back weekly.  She says she is not sleeping all that well.  This might be from the Decadron that she is taking.  I will see what we can try to do to help with her sleep.    We will see her next week.   [name], MD 8/29/20199:32 AM

## 2018-03-22 NOTE — Addendum Note (Signed)
Addended by: Burney Gauze R on: 03/22/2018 04:25 PM   Modules accepted: Orders

## 2018-03-22 NOTE — Addendum Note (Signed)
Addended by: Burney Gauze R on: 03/22/2018 10:34 AM   Modules accepted: Orders

## 2018-03-27 DIAGNOSIS — Z510 Encounter for antineoplastic radiation therapy: Secondary | ICD-10-CM | POA: Diagnosis not present

## 2018-03-27 DIAGNOSIS — C679 Malignant neoplasm of bladder, unspecified: Secondary | ICD-10-CM | POA: Diagnosis not present

## 2018-03-28 DIAGNOSIS — Z510 Encounter for antineoplastic radiation therapy: Secondary | ICD-10-CM | POA: Diagnosis not present

## 2018-03-28 DIAGNOSIS — C679 Malignant neoplasm of bladder, unspecified: Secondary | ICD-10-CM | POA: Diagnosis not present

## 2018-03-29 ENCOUNTER — Other Ambulatory Visit: Payer: Self-pay

## 2018-03-29 ENCOUNTER — Encounter: Payer: Self-pay | Admitting: Family

## 2018-03-29 ENCOUNTER — Inpatient Hospital Stay: Payer: Medicare Other

## 2018-03-29 ENCOUNTER — Telehealth: Payer: Self-pay

## 2018-03-29 ENCOUNTER — Inpatient Hospital Stay: Payer: Medicare Other | Attending: Hematology & Oncology | Admitting: Family

## 2018-03-29 VITALS — BP 118/81 | HR 94 | Temp 98.2°F | Resp 18 | Wt 75.0 lb

## 2018-03-29 DIAGNOSIS — Z79899 Other long term (current) drug therapy: Secondary | ICD-10-CM | POA: Diagnosis not present

## 2018-03-29 DIAGNOSIS — N3000 Acute cystitis without hematuria: Secondary | ICD-10-CM

## 2018-03-29 DIAGNOSIS — C672 Malignant neoplasm of lateral wall of bladder: Secondary | ICD-10-CM | POA: Diagnosis not present

## 2018-03-29 DIAGNOSIS — C670 Malignant neoplasm of trigone of bladder: Secondary | ICD-10-CM

## 2018-03-29 DIAGNOSIS — C679 Malignant neoplasm of bladder, unspecified: Secondary | ICD-10-CM | POA: Diagnosis not present

## 2018-03-29 DIAGNOSIS — D500 Iron deficiency anemia secondary to blood loss (chronic): Secondary | ICD-10-CM

## 2018-03-29 DIAGNOSIS — Z5111 Encounter for antineoplastic chemotherapy: Secondary | ICD-10-CM | POA: Diagnosis not present

## 2018-03-29 DIAGNOSIS — K5900 Constipation, unspecified: Secondary | ICD-10-CM | POA: Diagnosis not present

## 2018-03-29 DIAGNOSIS — Z510 Encounter for antineoplastic radiation therapy: Secondary | ICD-10-CM | POA: Diagnosis not present

## 2018-03-29 LAB — CMP (CANCER CENTER ONLY)
ALT: 24 U/L (ref 10–47)
AST: 16 U/L (ref 11–38)
Albumin: 3.5 g/dL (ref 3.5–5.0)
Alkaline Phosphatase: 75 U/L (ref 26–84)
Anion gap: 3 — ABNORMAL LOW (ref 5–15)
BUN: 26 mg/dL — ABNORMAL HIGH (ref 7–22)
CO2: 26 mmol/L (ref 18–33)
Calcium: 9.3 mg/dL (ref 8.0–10.3)
Chloride: 108 mmol/L (ref 98–108)
Creatinine: 1.2 mg/dL (ref 0.60–1.20)
Glucose, Bld: 105 mg/dL (ref 73–118)
Potassium: 4.6 mmol/L (ref 3.3–4.7)
Sodium: 137 mmol/L (ref 128–145)
Total Bilirubin: 0.5 mg/dL (ref 0.2–1.6)
Total Protein: 7 g/dL (ref 6.4–8.1)

## 2018-03-29 LAB — CBC WITH DIFFERENTIAL/PLATELET
Basophils Absolute: 0 10*3/uL (ref 0.0–0.1)
Basophils Relative: 0 %
Eosinophils Absolute: 0 10*3/uL (ref 0.0–0.7)
Eosinophils Relative: 0 %
HCT: 36.9 % (ref 36.0–46.0)
Hemoglobin: 12.5 g/dL (ref 12.0–15.0)
Lymphocytes Relative: 6 %
Lymphs Abs: 0.3 10*3/uL — ABNORMAL LOW (ref 0.7–4.0)
MCH: 30.4 pg (ref 26.0–34.0)
MCHC: 33.9 g/dL (ref 30.0–36.0)
MCV: 89.8 fL (ref 78.0–100.0)
Monocytes Absolute: 0.7 10*3/uL (ref 0.1–1.0)
Monocytes Relative: 17 %
Neutro Abs: 3.4 10*3/uL (ref 1.7–7.7)
Neutrophils Relative %: 77 %
Platelets: 200 10*3/uL (ref 150–400)
RBC: 4.11 MIL/uL (ref 3.87–5.11)
RDW: 17.7 % — ABNORMAL HIGH (ref 11.5–15.5)
WBC: 4.4 10*3/uL (ref 4.0–10.5)

## 2018-03-29 LAB — URINALYSIS, COMPLETE (UACMP) WITH MICROSCOPIC
Bilirubin Urine: NEGATIVE
Glucose, UA: >=500 mg/dL — AB
Ketones, ur: NEGATIVE mg/dL
Nitrite: NEGATIVE
Protein, ur: 30 mg/dL — AB
Specific Gravity, Urine: 1.01 (ref 1.005–1.030)
pH: 7 (ref 5.0–8.0)

## 2018-03-29 MED ORDER — SODIUM CHLORIDE 0.9 % IV SOLN
Freq: Once | INTRAVENOUS | Status: AC
  Administered 2018-03-29: 12:00:00 via INTRAVENOUS
  Filled 2018-03-29: qty 5

## 2018-03-29 MED ORDER — SODIUM CHLORIDE 0.9 % IV SOLN
Freq: Once | INTRAVENOUS | Status: AC
  Administered 2018-03-29: 10:00:00 via INTRAVENOUS
  Filled 2018-03-29: qty 250

## 2018-03-29 MED ORDER — LACTULOSE 10 GM/15ML PO SOLN: 10.0000 g | Freq: Two times a day (BID) | ORAL | 1 refills | Status: DC | PRN

## 2018-03-29 MED ORDER — POTASSIUM CHLORIDE 2 MEQ/ML IV SOLN
Freq: Once | INTRAVENOUS | Status: AC
  Administered 2018-03-29: 10:00:00 via INTRAVENOUS
  Filled 2018-03-29: qty 10

## 2018-03-29 MED ORDER — PALONOSETRON HCL INJECTION 0.25 MG/5ML
0.2500 mg | Freq: Once | INTRAVENOUS | Status: AC
  Administered 2018-03-29: 0.25 mg via INTRAVENOUS

## 2018-03-29 MED ORDER — HEPARIN SOD (PORK) LOCK FLUSH 100 UNIT/ML IV SOLN
500.0000 [IU] | Freq: Once | INTRAVENOUS | Status: AC | PRN
  Administered 2018-03-29: 500 [IU]
  Filled 2018-03-29: qty 5

## 2018-03-29 MED ORDER — SULFAMETHOXAZOLE-TRIMETHOPRIM 800-160 MG PO TABS: 1.0000 | ORAL_TABLET | Freq: Two times a day (BID) | ORAL | 0 refills | Status: DC

## 2018-03-29 MED ORDER — SODIUM CHLORIDE 0.9% FLUSH
10.0000 mL | INTRAVENOUS | Status: DC | PRN
  Administered 2018-03-29: 10 mL
  Filled 2018-03-29: qty 10

## 2018-03-29 MED ORDER — IPRATROPIUM-ALBUTEROL 0.5-2.5 (3) MG/3ML IN SOLN
RESPIRATORY_TRACT | Status: AC
  Filled 2018-03-29: qty 3

## 2018-03-29 MED ORDER — PALONOSETRON HCL INJECTION 0.25 MG/5ML
INTRAVENOUS | Status: AC
  Filled 2018-03-29: qty 5

## 2018-03-29 MED ORDER — SODIUM CHLORIDE 0.9 % IV SOLN
35.0000 mg/m2 | Freq: Once | INTRAVENOUS | Status: AC
  Administered 2018-03-29: 44 mg via INTRAVENOUS
  Filled 2018-03-29: qty 44

## 2018-03-29 NOTE — Telephone Encounter (Signed)
Erroneous encounter

## 2018-03-29 NOTE — Patient Instructions (Signed)
Cisplatin injection What is this medicine? CISPLATIN (SIS pla tin) is a chemotherapy drug. It targets fast dividing cells, like cancer cells, and causes these cells to die. This medicine is used to treat many types of cancer like bladder, ovarian, and testicular cancers. This medicine may be used for other purposes; ask your health care provider or pharmacist if you have questions. COMMON BRAND NAME(S): Platinol, Platinol -AQ What should I tell my health care provider before I take this medicine? They need to know if you have any of these conditions: -blood disorders -hearing problems -kidney disease -recent or ongoing radiation therapy -an unusual or allergic reaction to cisplatin, carboplatin, other chemotherapy, other medicines, foods, dyes, or preservatives -pregnant or trying to get pregnant -breast-feeding How should I use this medicine? This drug is given as an infusion into a vein. It is administered in a hospital or clinic by a specially trained health care professional. Talk to your pediatrician regarding the use of this medicine in children. Special care may be needed. Overdosage: If you think you have taken too much of this medicine contact a poison control center or emergency room at once. NOTE: This medicine is only for you. Do not share this medicine with others. What if I miss a dose? It is important not to miss a dose. Call your doctor or health care professional if you are unable to keep an appointment. What may interact with this medicine? -dofetilide -foscarnet -medicines for seizures -medicines to increase blood counts like filgrastim, pegfilgrastim, sargramostim -probenecid -pyridoxine used with altretamine -rituximab -some antibiotics like amikacin, gentamicin, neomycin, polymyxin B, streptomycin, tobramycin -sulfinpyrazone -vaccines -zalcitabine Talk to your doctor or health care professional before taking any of these  medicines: -acetaminophen -aspirin -ibuprofen -ketoprofen -naproxen This list may not describe all possible interactions. Give your health care provider a list of all the medicines, herbs, non-prescription drugs, or dietary supplements you use. Also tell them if you smoke, drink alcohol, or use illegal drugs. Some items may interact with your medicine. What should I watch for while using this medicine? Your condition will be monitored carefully while you are receiving this medicine. You will need important blood work done while you are taking this medicine. This drug may make you feel generally unwell. This is not uncommon, as chemotherapy can affect healthy cells as well as cancer cells. Report any side effects. Continue your course of treatment even though you feel ill unless your doctor tells you to stop. In some cases, you may be given additional medicines to help with side effects. Follow all directions for their use. Call your doctor or health care professional for advice if you get a fever, chills or sore throat, or other symptoms of a cold or flu. Do not treat yourself. This drug decreases your body's ability to fight infections. Try to avoid being around people who are sick. This medicine may increase your risk to bruise or bleed. Call your doctor or health care professional if you notice any unusual bleeding. Be careful brushing and flossing your teeth or using a toothpick because you may get an infection or bleed more easily. If you have any dental work done, tell your dentist you are receiving this medicine. Avoid taking products that contain aspirin, acetaminophen, ibuprofen, naproxen, or ketoprofen unless instructed by your doctor. These medicines may hide a fever. Do not become pregnant while taking this medicine. Women should inform their doctor if they wish to become pregnant or think they might be pregnant. There is a   potential for serious side effects to an unborn child. Talk to  your health care professional or pharmacist for more information. Do not breast-feed an infant while taking this medicine. Drink fluids as directed while you are taking this medicine. This will help protect your kidneys. Call your doctor or health care professional if you get diarrhea. Do not treat yourself. What side effects may I notice from receiving this medicine? Side effects that you should report to your doctor or health care professional as soon as possible: -allergic reactions like skin rash, itching or hives, swelling of the face, lips, or tongue -signs of infection - fever or chills, cough, sore throat, pain or difficulty passing urine -signs of decreased platelets or bleeding - bruising, pinpoint red spots on the skin, black, tarry stools, nosebleeds -signs of decreased red blood cells - unusually weak or tired, fainting spells, lightheadedness -breathing problems -changes in hearing -gout pain -low blood counts - This drug may decrease the number of white blood cells, red blood cells and platelets. You may be at increased risk for infections and bleeding. -nausea and vomiting -pain, swelling, redness or irritation at the injection site -pain, tingling, numbness in the hands or feet -problems with balance, movement -trouble passing urine or change in the amount of urine Side effects that usually do not require medical attention (report to your doctor or health care professional if they continue or are bothersome): -changes in vision -loss of appetite -metallic taste in the mouth or changes in taste This list may not describe all possible side effects. Call your doctor for medical advice about side effects. You may report side effects to FDA at 1-800-FDA-1088. Where should I keep my medicine? This drug is given in a hospital or clinic and will not be stored at home. NOTE: This sheet is a summary. It may not cover all possible information. If you have questions about this medicine,  talk to your doctor, pharmacist, or health care provider.  2018 Elsevier/Gold Standard (2007-10-16 14:40:54)

## 2018-03-29 NOTE — Patient Instructions (Signed)
Implanted Port Home Guide An implanted port is a type of central line that is placed under the skin. Central lines are used to provide IV access when treatment or nutrition needs to be given through a person's veins. Implanted ports are used for long-term IV access. An implanted port may be placed because:  You need IV medicine that would be irritating to the small veins in your hands or arms.  You need long-term IV medicines, such as antibiotics.  You need IV nutrition for a long period.  You need frequent blood draws for lab tests.  You need dialysis.  Implanted ports are usually placed in the chest area, but they can also be placed in the upper arm, the abdomen, or the leg. An implanted port has two main parts:  Reservoir. The reservoir is round and will appear as a small, raised area under your skin. The reservoir is the part where a needle is inserted to give medicines or draw blood.  Catheter. The catheter is a thin, flexible tube that extends from the reservoir. The catheter is placed into a large vein. Medicine that is inserted into the reservoir goes into the catheter and then into the vein.  How will I care for my incision site? Do not get the incision site wet. Bathe or shower as directed by your health care provider. How is my port accessed? Special steps must be taken to access the port:  Before the port is accessed, a numbing cream can be placed on the skin. This helps numb the skin over the port site.  Your health care provider uses a sterile technique to access the port. ? Your health care provider must put on a mask and sterile gloves. ? The skin over your port is cleaned carefully with an antiseptic and allowed to dry. ? The port is gently pinched between sterile gloves, and a needle is inserted into the port.  Only "non-coring" port needles should be used to access the port. Once the port is accessed, a blood return should be checked. This helps ensure that the port  is in the vein and is not clogged.  If your port needs to remain accessed for a constant infusion, a clear (transparent) bandage will be placed over the needle site. The bandage and needle will need to be changed every week, or as directed by your health care provider.  Keep the bandage covering the needle clean and dry. Do not get it wet. Follow your health care provider's instructions on how to take a shower or bath while the port is accessed.  If your port does not need to stay accessed, no bandage is needed over the port.  What is flushing? Flushing helps keep the port from getting clogged. Follow your health care provider's instructions on how and when to flush the port. Ports are usually flushed with saline solution or a medicine called heparin. The need for flushing will depend on how the port is used.  If the port is used for intermittent medicines or blood draws, the port will need to be flushed: ? After medicines have been given. ? After blood has been drawn. ? As part of routine maintenance.  If a constant infusion is running, the port may not need to be flushed.  How long will my port stay implanted? The port can stay in for as long as your health care provider thinks it is needed. When it is time for the port to come out, surgery will be   done to remove it. The procedure is similar to the one performed when the port was put in. When should I seek immediate medical care? When you have an implanted port, you should seek immediate medical care if:  You notice a bad smell coming from the incision site.  You have swelling, redness, or drainage at the incision site.  You have more swelling or pain at the port site or the surrounding area.  You have a fever that is not controlled with medicine.  This information is not intended to replace advice given to you by your health care provider. Make sure you discuss any questions you have with your health care provider. Document  Released: 07/11/2005 Document Revised: 12/17/2015 Document Reviewed: 03/18/2013 Elsevier Interactive Patient Education  2017 Elsevier Inc.  

## 2018-03-29 NOTE — Progress Notes (Signed)
Hematology and Oncology Follow Up Visit  Donna Curry [id number] Dec 12, 1955 62 y.o. 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 She is having urinary frequency and burning. Urinalysis was positive for UTI, culture sent.  She continues to have night sweats. She states this has been going on for months and is unchanged.  No fever, chills, n/v, cough, rash, dizziness, SOB, chest pain, palpitations, abdominal pain or changes in bowel habits.  She is still having constipation despite taking Mirilax twice a day.  No swelling, tenderness, numbness or tingling in her extremities. No c/o pain.  No lymphadenopathy noted one exam.  No episodes of bleeding, no bruising or petechiae.  She has maintained a good appetite and is hydrating very well. Her weight is stable.   ECOG Performance Status: 2 - Symptomatic, <50% confined to bed  Medications:  Allergies as of 03/29/2018   No Known Allergies     Medication List        Accurate as of 03/29/18  8:58 AM. Always use your most recent med list.          acetaminophen 500 MG tablet Commonly known as:  TYLENOL Take 1,000 mg by mouth every 8 (eight) hours as needed for moderate pain.   amLODipine 10 MG tablet Commonly known as:  NORVASC Take 10 mg by mouth daily.   dexamethasone 4 MG tablet Commonly known as:  DECADRON Take 2 tablets by mouth once a day on the day after chemotherapy and then take 2 tablets two times a day for 2 days. Take with food.   diclofenac 75 MG EC tablet Commonly known as:  VOLTAREN Take 1 tablet (75 mg total) by mouth 2 (two) times daily.   docusate sodium 100 MG capsule Commonly known as:  COLACE Take 1  capsule (100 mg total) by mouth 2 (two) times daily.   Doxepin HCl 3 MG Tabs Take 1 tablet (3 mg total) by mouth at bedtime.   ferrous sulfate 325 (65 FE) MG tablet Take 1 tablet (325 mg total) by mouth 2 (two) times daily with a meal.   Ipratropium-Albuterol 20-100 MCG/ACT Aers respimat Commonly known as:  COMBIVENT Inhale 1 puff into the lungs every 6 (six) hours.   lidocaine-prilocaine cream Commonly known as:  EMLA Apply to affected area once   LORazepam 0.5 MG tablet Commonly known as:  ATIVAN Take 1 tablet (0.5 mg total) by mouth every 6 (six) hours as needed (Nausea or vomiting).   MARINOL 2.5 MG capsule Generic drug:  dronabinol TAKE 1 CAPSULE BY MOUTH TWICE DAILY BEFORE A MEAL   megestrol 625 MG/5ML suspension Commonly known as:  MEGACE ES Take 5 mLs (625 mg total) by mouth daily.   nicotine 21 mg/24hr patch Commonly known as:  NICODERM CQ - dosed in mg/24 hours Place 1 patch (21 mg total) onto the skin daily.   ondansetron 4 MG tablet Commonly known as:  ZOFRAN Take 1 tablet (4 mg total) by mouth daily as needed for nausea or vomiting.   ondansetron 8 MG tablet Commonly known as:  ZOFRAN Take 1 tablet (8 mg total) by mouth 2 (two) times daily as needed. Start on the third day after chemotherapy.   oxandrolone 10  MG tablet Commonly known as:  OXANDRIN Take 1 tablet (10 mg total) by mouth daily.   prochlorperazine 10 MG tablet Commonly known as:  COMPAZINE Take 1 tablet (10 mg total) by mouth every 6 (six) hours as needed (Nausea or vomiting).   Vitamin D (Ergocalciferol) 50000 units Caps capsule Commonly known as:  DRISDOL Take 1 capsule (50,000 Units total) by mouth every 7 (seven) days.       Allergies: No Known Allergies  Past Medical History, Surgical history, Social history, and Family History were reviewed and updated.  Review of Systems: All other 10 point review of systems is negative.   Physical Exam:  weight is 75 lb (34 kg). Her oral  temperature is 98.2 F (36.8 C). Her blood pressure is 118/81 and her pulse is 94. Her respiration is 18 and oxygen saturation is 100%.   Wt Readings from Last 3 Encounters:  03/29/18 75 lb (34 kg)  03/22/18 77 lb (34.9 kg)  03/08/18 72 lb (32.7 kg)    Ocular: Sclerae unicteric, pupils equal, round and reactive to light Ear-nose-throat: Oropharynx clear, dentition fair Lymphatic: No cervical, supraclavicular or axillary adenopathy Lungs no rales or rhonchi, good excursion bilaterally Heart regular rate and rhythm, no murmur appreciated Abd soft, nontender, positive bowel sounds, no liver or spleen tip palpated on exam, no fluid wave  MSK no focal spinal tenderness, no joint edema Neuro: non-focal, well-oriented, appropriate affect Breasts: Deferred   Lab Results  Component Value Date   WBC 4.4 03/22/2018   HGB 11.4 (L) 03/22/2018   HCT 34.7 (L) 03/22/2018   MCV 91.1 03/22/2018   PLT 164 03/22/2018   Lab Results  Component Value Date   FERRITIN 42 03/01/2018   IRON 52 03/01/2018   TIBC 360 03/01/2018   UIBC 308 03/01/2018   IRONPCTSAT 14 (L) 03/01/2018   Lab Results  Component Value Date   RBC 3.81 (L) 03/22/2018   No results found for: KPAFRELGTCHN, LAMBDASER, KAPLAMBRATIO No results found for: IGGSERUM, IGA, IGMSERUM No results found for: [name], SPEI   Chemistry      Component Value Date/Time   NA 142 03/22/2018 0852   K 4.4 03/22/2018 0852   CL 105 03/22/2018 0852   CO2 29 03/22/2018 0852   BUN 12 03/22/2018 0852   CREATININE 0.70 03/22/2018 0852   CREATININE 1.10 08/20/2013 1027      Component Value Date/Time   CALCIUM 9.2 03/22/2018 0852   ALKPHOS 71 03/15/2018 0904   AST 17 03/15/2018 0904   ALT 15 03/15/2018 0904   BILITOT 0.4 03/15/2018 0904      Impression and Plan: Donna Curry is a very pleasant 62 yo African American female with locally advanced high grade urothelial carcinoma of the  bladder. She continues to tolerate treatment nicely but states that she is symptomatic with urinary frequency/burning as well as constipation.  Urinalysis positive for UTI and sent for culture. We will go ahead and get her onto Bactrim DS for 7 days.  She will also try Lactulose for her constipation.  We will proceed with treatment today as planned per [name].  We will plan to see her back again next week for follow-up.  They will contact our office with any questions or concerns. We can certainly see her sooner if need be.   Laverna Peace, NP 9/5/20198:58 AM

## 2018-03-30 DIAGNOSIS — Z510 Encounter for antineoplastic radiation therapy: Secondary | ICD-10-CM | POA: Diagnosis not present

## 2018-03-30 DIAGNOSIS — C679 Malignant neoplasm of bladder, unspecified: Secondary | ICD-10-CM | POA: Diagnosis not present

## 2018-03-30 LAB — URINE CULTURE: Culture: NO GROWTH

## 2018-04-02 DIAGNOSIS — C679 Malignant neoplasm of bladder, unspecified: Secondary | ICD-10-CM | POA: Diagnosis not present

## 2018-04-02 DIAGNOSIS — Z510 Encounter for antineoplastic radiation therapy: Secondary | ICD-10-CM | POA: Diagnosis not present

## 2018-04-03 DIAGNOSIS — Z510 Encounter for antineoplastic radiation therapy: Secondary | ICD-10-CM | POA: Diagnosis not present

## 2018-04-03 DIAGNOSIS — C679 Malignant neoplasm of bladder, unspecified: Secondary | ICD-10-CM | POA: Diagnosis not present

## 2018-04-04 DIAGNOSIS — Z510 Encounter for antineoplastic radiation therapy: Secondary | ICD-10-CM | POA: Diagnosis not present

## 2018-04-04 DIAGNOSIS — C679 Malignant neoplasm of bladder, unspecified: Secondary | ICD-10-CM | POA: Diagnosis not present

## 2018-04-05 ENCOUNTER — Other Ambulatory Visit: Payer: Self-pay

## 2018-04-05 ENCOUNTER — Inpatient Hospital Stay: Payer: Medicare Other

## 2018-04-05 ENCOUNTER — Encounter: Payer: Self-pay | Admitting: Hematology & Oncology

## 2018-04-05 ENCOUNTER — Inpatient Hospital Stay (HOSPITAL_BASED_OUTPATIENT_CLINIC_OR_DEPARTMENT_OTHER): Payer: Medicare Other | Admitting: Hematology & Oncology

## 2018-04-05 VITALS — BP 122/92 | HR 98 | Temp 98.5°F | Resp 16 | Wt 72.0 lb

## 2018-04-05 DIAGNOSIS — C670 Malignant neoplasm of trigone of bladder: Secondary | ICD-10-CM | POA: Diagnosis not present

## 2018-04-05 DIAGNOSIS — C672 Malignant neoplasm of lateral wall of bladder: Secondary | ICD-10-CM

## 2018-04-05 DIAGNOSIS — Z5111 Encounter for antineoplastic chemotherapy: Secondary | ICD-10-CM | POA: Diagnosis not present

## 2018-04-05 DIAGNOSIS — F17210 Nicotine dependence, cigarettes, uncomplicated: Secondary | ICD-10-CM | POA: Diagnosis not present

## 2018-04-05 DIAGNOSIS — R634 Abnormal weight loss: Secondary | ICD-10-CM

## 2018-04-05 DIAGNOSIS — Z79899 Other long term (current) drug therapy: Secondary | ICD-10-CM | POA: Diagnosis not present

## 2018-04-05 DIAGNOSIS — D500 Iron deficiency anemia secondary to blood loss (chronic): Secondary | ICD-10-CM

## 2018-04-05 DIAGNOSIS — Z510 Encounter for antineoplastic radiation therapy: Secondary | ICD-10-CM | POA: Diagnosis not present

## 2018-04-05 DIAGNOSIS — C679 Malignant neoplasm of bladder, unspecified: Secondary | ICD-10-CM | POA: Diagnosis not present

## 2018-04-05 LAB — CBC WITH DIFFERENTIAL (CANCER CENTER ONLY)
Basophils Absolute: 0 10*3/uL (ref 0.0–0.1)
Basophils Relative: 0 %
Eosinophils Absolute: 0 10*3/uL (ref 0.0–0.5)
Eosinophils Relative: 0 %
HCT: 36.4 % (ref 34.8–46.6)
Hemoglobin: 11.8 g/dL (ref 11.6–15.9)
Lymphocytes Relative: 5 %
Lymphs Abs: 0.3 10*3/uL — ABNORMAL LOW (ref 0.9–3.3)
MCH: 29.9 pg (ref 26.0–34.0)
MCHC: 32.4 g/dL (ref 32.0–36.0)
MCV: 92.4 fL (ref 81.0–101.0)
Monocytes Absolute: 0.6 10*3/uL (ref 0.1–0.9)
Monocytes Relative: 11 %
Neutro Abs: 4.6 10*3/uL (ref 1.5–6.5)
Neutrophils Relative %: 84 %
Platelet Count: 124 10*3/uL — ABNORMAL LOW (ref 145–400)
RBC: 3.94 MIL/uL (ref 3.70–5.32)
RDW: 17.5 % — ABNORMAL HIGH (ref 11.1–15.7)
WBC Count: 5.4 10*3/uL (ref 3.9–10.0)

## 2018-04-05 LAB — CMP (CANCER CENTER ONLY)
ALT: 24 U/L (ref 10–47)
AST: 21 U/L (ref 11–38)
Albumin: 3.7 g/dL (ref 3.5–5.0)
Alkaline Phosphatase: 83 U/L (ref 26–84)
Anion gap: 3 — ABNORMAL LOW (ref 5–15)
BUN: 22 mg/dL (ref 7–22)
CO2: 21 mmol/L (ref 18–33)
Calcium: 9.5 mg/dL (ref 8.0–10.3)
Chloride: 111 mmol/L — ABNORMAL HIGH (ref 98–108)
Creatinine: 1.9 mg/dL — ABNORMAL HIGH (ref 0.60–1.20)
Glucose, Bld: 103 mg/dL (ref 73–118)
Potassium: 6 mmol/L — ABNORMAL HIGH (ref 3.3–4.7)
Sodium: 135 mmol/L (ref 128–145)
Total Bilirubin: 0.5 mg/dL (ref 0.2–1.6)
Total Protein: 7.2 g/dL (ref 6.4–8.1)

## 2018-04-05 LAB — IRON AND TIBC
Iron: 72 ug/dL (ref 41–142)
Saturation Ratios: 29 % (ref 21–57)
TIBC: 249 ug/dL (ref 236–444)
UIBC: 177 ug/dL

## 2018-04-05 LAB — FERRITIN: Ferritin: 1236 ng/mL — ABNORMAL HIGH (ref 11–307)

## 2018-04-05 MED ORDER — SODIUM CHLORIDE 0.9 % IV SOLN
Freq: Once | INTRAVENOUS | Status: AC
  Administered 2018-04-05: 10:00:00 via INTRAVENOUS
  Filled 2018-04-05: qty 1000

## 2018-04-05 MED ORDER — SODIUM CHLORIDE 0.9 % IV SOLN
35.0000 mg/m2 | Freq: Once | INTRAVENOUS | Status: AC
  Administered 2018-04-05: 44 mg via INTRAVENOUS
  Filled 2018-04-05: qty 44

## 2018-04-05 MED ORDER — SODIUM CHLORIDE 0.9 % IV SOLN
INTRAVENOUS | Status: DC
  Administered 2018-04-05: 11:00:00 via INTRAVENOUS
  Filled 2018-04-05 (×2): qty 250

## 2018-04-05 MED ORDER — SODIUM CHLORIDE 0.9% FLUSH
10.0000 mL | INTRAVENOUS | Status: DC | PRN
  Administered 2018-04-05: 10 mL
  Filled 2018-04-05: qty 10

## 2018-04-05 MED ORDER — SODIUM CHLORIDE 0.9 % IV SOLN
Freq: Once | INTRAVENOUS | Status: AC
  Administered 2018-04-05: 11:00:00 via INTRAVENOUS
  Filled 2018-04-05: qty 5

## 2018-04-05 MED ORDER — POTASSIUM CHLORIDE 2 MEQ/ML IV SOLN
Freq: Once | INTRAVENOUS | Status: AC
  Administered 2018-04-05: 09:00:00 via INTRAVENOUS
  Filled 2018-04-05: qty 10

## 2018-04-05 MED ORDER — PALONOSETRON HCL INJECTION 0.25 MG/5ML
INTRAVENOUS | Status: AC
  Filled 2018-04-05: qty 5

## 2018-04-05 MED ORDER — HEPARIN SOD (PORK) LOCK FLUSH 100 UNIT/ML IV SOLN
500.0000 [IU] | Freq: Once | INTRAVENOUS | Status: AC | PRN
  Administered 2018-04-05: 500 [IU]
  Filled 2018-04-05: qty 5

## 2018-04-05 MED ORDER — PALONOSETRON HCL INJECTION 0.25 MG/5ML
0.2500 mg | Freq: Once | INTRAVENOUS | Status: AC
  Administered 2018-04-05: 0.25 mg via INTRAVENOUS

## 2018-04-05 MED ORDER — SODIUM CHLORIDE 0.9 % IV SOLN
Freq: Once | INTRAVENOUS | Status: AC
  Administered 2018-04-05: 09:00:00 via INTRAVENOUS
  Filled 2018-04-05: qty 250

## 2018-04-05 NOTE — Patient Instructions (Signed)
[facility] Discharge Instructions for Patients Receiving Chemotherapy  Today you received the following chemotherapy agents: Cisplatin   To help prevent nausea and vomiting after your treatment, we encourage you to take your nausea medication as directed.    If you develop nausea and vomiting that is not controlled by your nausea medication, call the clinic.   BELOW ARE SYMPTOMS THAT SHOULD BE REPORTED IMMEDIATELY:  *FEVER GREATER THAN 100.5 F  *CHILLS WITH OR WITHOUT FEVER  NAUSEA AND VOMITING THAT IS NOT CONTROLLED WITH YOUR NAUSEA MEDICATION  *UNUSUAL SHORTNESS OF BREATH  *UNUSUAL BRUISING OR BLEEDING  TENDERNESS IN MOUTH AND THROAT WITH OR WITHOUT PRESENCE OF ULCERS  *URINARY PROBLEMS  *BOWEL PROBLEMS  UNUSUAL RASH Items with * indicate a potential emergency and should be followed up as soon as possible.  Feel free to call the clinic you have any questions or concerns. The clinic phone number is [phone].  Please show the CHEMO ALERT CARD at check-in to the Emergency Department and triage nurse.   

## 2018-04-05 NOTE — Progress Notes (Signed)
OK to proceed with treatment with current urine output per MD - he also reviewed Creatinine today and ordered US to proceed with treatment

## 2018-04-05 NOTE — Progress Notes (Signed)
Hematology and Oncology Follow Up Visit  Donna Curry [id number] 01/11/1956 62 y.o. 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  She is doing okay.  She still has not gained weight.  She is still eating quite a bit.  She is tolerating treatment quite nicely.  She has had no problems with the radiation.  She is had no hematuria.  She is had no pelvic pain.  Today is her last day of cis-platinum.  Has done well with this.  She has had no cough.  She is still smoking.  We have given her iron in the past.  Her hemoglobin is improving quite nicely.  I told her that she does not have to take oral iron.    I told her that once the radiation is completed, which should be next week, I probably would wait at least 2 months until we did a another PET scan on her to see the response.  Again she is eating although she is not gaining weight.    She has had no leg swelling.  She has had no rashes.  She has had no fever.  There is been no mouth sores.  Overall, her performance status is ECOG 2.    Medications:  Current Outpatient Medications:  .  acetaminophen (TYLENOL) 500 MG tablet, Take 1,000 mg by mouth every 8 (eight) hours as needed for moderate pain., Disp: , Rfl:  .  amLODipine (NORVASC) 10 MG tablet, Take 10 mg by mouth daily., Disp: , Rfl:  .  dexamethasone (DECADRON) 4 MG tablet, Take 2 tablets by mouth once a day on the day after chemotherapy and then take 2 tablets two times a day for 2 days. Take with food., Disp: 30 tablet, Rfl: 1 .  diclofenac (VOLTAREN) 75 MG EC tablet, Take 1 tablet (75 mg total) by mouth 2 (two) times daily. (Patient not taking: Reported on 12/08/2017), Disp: 14 tablet, Rfl: 0 .  docusate  sodium (COLACE) 100 MG capsule, Take 1 capsule (100 mg total) by mouth 2 (two) times daily., Disp: 10 capsule, Rfl: 0 .  Doxepin HCl (SILENOR) 3 MG TABS, Take 1 tablet (3 mg total) by mouth at bedtime., Disp: 30 tablet, Rfl: 2 .  ferrous sulfate 325 (65 FE) MG tablet, Take 1 tablet (325 mg total) by mouth 2 (two) times daily with a meal., Disp: 60 tablet, Rfl: 3 .  Ipratropium-Albuterol (COMBIVENT) 20-100 MCG/ACT AERS respimat, Inhale 1 puff into the lungs every 6 (six) hours., Disp: 2 Inhaler, Rfl: 3 .  lactulose (CHRONULAC) 10 GM/15ML solution, Take 15 mLs (10 g total) by mouth 2 (two) times daily as needed for mild constipation or moderate constipation., Disp: 473 mL, Rfl: 1 .  lidocaine-prilocaine (EMLA) cream, Apply to affected area once, Disp: 30 g, Rfl: 3 .  LORazepam (ATIVAN) 0.5 MG tablet, Take 1 tablet (0.5 mg total) by mouth every 6 (six) hours as needed (Nausea or vomiting)., Disp: 30 tablet, Rfl: 0 .  MARINOL 2.5 MG capsule, TAKE 1 CAPSULE BY MOUTH TWICE DAILY BEFORE A MEAL, Disp: 60 capsule, Rfl: 0 .  megestrol (MEGACE ES) 625 MG/5ML suspension, Take 5 mLs (625 mg total) by mouth daily., Disp: 150 mL, Rfl: 3 .  nicotine (NICODERM CQ - DOSED IN MG/24 HOURS) 21 mg/24hr patch, Place 1 patch (21 mg total) onto the skin daily., Disp: 28 patch, Rfl: 0 .  ondansetron (ZOFRAN) 4 MG tablet, Take 1 tablet (4 mg total) by mouth daily as needed for nausea or vomiting., Disp: 30 tablet, Rfl: 1 .  ondansetron (ZOFRAN) 8 MG tablet, Take 1 tablet (8 mg total) by mouth 2 (two) times daily as needed. Start on the third day after chemotherapy., Disp: 30 tablet, Rfl: 1 .  oxandrolone (OXANDRIN) 10 MG tablet, Take 1 tablet (10 mg total) by mouth daily., Disp: 30 tablet, Rfl: 3 .  prochlorperazine (COMPAZINE) 10 MG tablet, Take 1 tablet (10 mg total) by mouth every 6 (six) hours as needed (Nausea or vomiting)., Disp: 30 tablet, Rfl: 1 .  sulfamethoxazole-trimethoprim (BACTRIM DS,SEPTRA DS) 800-160 MG tablet,  Take 1 tablet by mouth 2 (two) times daily., Disp: 14 tablet, Rfl: 0 .  Vitamin D, Ergocalciferol, (DRISDOL) 50000 units CAPS capsule, Take 1 capsule (50,000 Units total) by mouth every 7 (seven) days., Disp: 8 capsule, Rfl: 0  Allergies: No Known Allergies  Past Medical History, Surgical history, Social history, and Family History were reviewed and updated.  Review of Systems: Review of Systems  Constitutional: Negative.   HENT:  Negative.   Eyes: Negative.   Respiratory: Negative.   Cardiovascular: Negative.   Gastrointestinal: Negative.   Endocrine: Negative.   Genitourinary: Negative.    Musculoskeletal: Negative.   Skin: Negative.   Neurological: Negative.   Hematological: Negative.   Psychiatric/Behavioral: Negative.     Physical Exam:  weight is 72 lb (32.7 kg). Her oral temperature is 98.5 F (36.9 C). Her blood pressure is 122/92 (abnormal) and her pulse is 98. Her respiration is 16.   Wt Readings from Last 3 Encounters:  04/05/18 72 lb (32.7 kg)  03/29/18 75 lb (34 kg)  03/22/18 77 lb (34.9 kg)    Physical Exam  Constitutional: She is oriented to person, place, and time.  HENT:  Head: Normocephalic and atraumatic.  Mouth/Throat: Oropharynx is clear and moist.  Eyes: Pupils are equal, round, and reactive to light. EOM are normal.  Neck: Normal range of motion.  Cardiovascular: Normal rate, regular rhythm and normal heart sounds.  Pulmonary/Chest: Effort normal and breath sounds normal.  Abdominal: Soft. Bowel sounds are normal.  Her abdominal exam is soft.  She has decent bowel sounds.  There might be some fullness in the hypoumbilical area.  She has no fluid wave.  There is no palpable liver or spleen tip.  Musculoskeletal: Normal range of motion. She exhibits no edema, tenderness or deformity.  Lymphadenopathy:    She has no cervical adenopathy.  Neurological: She is alert and oriented to person, place, and time.  Skin: Skin is warm and dry. No rash noted.  No erythema.  Psychiatric: She has a normal mood and affect. Her behavior is normal. Judgment and thought content normal.  Vitals reviewed.    Lab Results  Component Value Date   WBC 5.4 04/05/2018   HGB 11.8 04/05/2018   HCT 36.4 04/05/2018   MCV 92.4 04/05/2018   PLT 124 (L) 04/05/2018     Chemistry      Component Value Date/Time   NA 137 03/29/2018 0850   K 4.6 03/29/2018 0850   CL 108 03/29/2018 0850   CO2 26 03/29/2018 0850   BUN 26 (H) 03/29/2018 0850   CREATININE 1.20 03/29/2018 0850   CREATININE 1.10 08/20/2013 1027  Component Value Date/Time   CALCIUM 9.3 03/29/2018 0850   ALKPHOS 75 03/29/2018 0850   AST 16 03/29/2018 0850   ALT 24 03/29/2018 0850   BILITOT 0.5 03/29/2018 0850         Impression and Plan: Donna Curry is a 62 year old African-American female.  She has locally advanced high-grade urothelial carcinoma of the bladder.  She will have her last treatment with weekly low-dose cisplatin today.  The cis-platinum is basically a radiosensitizer.  I would like to think that this will help eradicate her bladder cancer.  Surprisingly her bladder cancer is just localized to the bladder.  When we have to reevaluate her, she will also need to have another cystoscopy.  I would like to see her back in about 3 weeks.  Hopefully, she will start to gain weight.  She said that she is taking the Marinol and the Megace elixir.  We have her on Oxandrin also.   [name], MD 9/12/20199:46 AM

## 2018-04-05 NOTE — Progress Notes (Signed)
04/05/18 @ 1100  Proceed with Cisplatin with scr 1.9 at current dose of 35 mg/m2 per RN, Amy S.  V.O. Dr Ted Mcalpine RN/Jameica Couts Ronnald Ramp, PharmD

## 2018-04-06 DIAGNOSIS — C679 Malignant neoplasm of bladder, unspecified: Secondary | ICD-10-CM | POA: Diagnosis not present

## 2018-04-06 DIAGNOSIS — Z510 Encounter for antineoplastic radiation therapy: Secondary | ICD-10-CM | POA: Diagnosis not present

## 2018-04-09 DIAGNOSIS — Z510 Encounter for antineoplastic radiation therapy: Secondary | ICD-10-CM | POA: Diagnosis not present

## 2018-04-09 DIAGNOSIS — C679 Malignant neoplasm of bladder, unspecified: Secondary | ICD-10-CM | POA: Diagnosis not present

## 2018-04-10 DIAGNOSIS — C679 Malignant neoplasm of bladder, unspecified: Secondary | ICD-10-CM | POA: Diagnosis not present

## 2018-04-10 DIAGNOSIS — Z510 Encounter for antineoplastic radiation therapy: Secondary | ICD-10-CM | POA: Diagnosis not present

## 2018-04-11 DIAGNOSIS — Z510 Encounter for antineoplastic radiation therapy: Secondary | ICD-10-CM | POA: Diagnosis not present

## 2018-04-11 DIAGNOSIS — C679 Malignant neoplasm of bladder, unspecified: Secondary | ICD-10-CM | POA: Diagnosis not present

## 2018-04-16 ENCOUNTER — Other Ambulatory Visit: Payer: Self-pay | Admitting: *Deleted

## 2018-04-16 DIAGNOSIS — N3000 Acute cystitis without hematuria: Secondary | ICD-10-CM

## 2018-04-16 MED ORDER — SULFAMETHOXAZOLE-TRIMETHOPRIM 800-160 MG PO TABS: 1.0000 | ORAL_TABLET | Freq: Two times a day (BID) | ORAL | 0 refills | Status: DC

## 2018-04-26 ENCOUNTER — Ambulatory Visit (INDEPENDENT_AMBULATORY_CARE_PROVIDER_SITE_OTHER): Payer: Medicare Other | Admitting: Pulmonary Disease

## 2018-04-26 ENCOUNTER — Encounter: Payer: Self-pay | Admitting: Hematology & Oncology

## 2018-04-26 ENCOUNTER — Inpatient Hospital Stay: Payer: Medicare Other

## 2018-04-26 ENCOUNTER — Inpatient Hospital Stay: Payer: Medicare Other | Attending: Hematology & Oncology | Admitting: Hematology & Oncology

## 2018-04-26 ENCOUNTER — Other Ambulatory Visit: Payer: Self-pay

## 2018-04-26 ENCOUNTER — Other Ambulatory Visit: Payer: Self-pay | Admitting: Family

## 2018-04-26 ENCOUNTER — Encounter: Payer: Self-pay | Admitting: Pulmonary Disease

## 2018-04-26 VITALS — BP 126/85 | HR 116 | Temp 98.1°F | Resp 18 | Wt 71.2 lb

## 2018-04-26 VITALS — BP 101/74 | HR 91 | Ht 64.0 in | Wt 71.2 lb

## 2018-04-26 DIAGNOSIS — N3001 Acute cystitis with hematuria: Secondary | ICD-10-CM

## 2018-04-26 DIAGNOSIS — J45909 Unspecified asthma, uncomplicated: Secondary | ICD-10-CM | POA: Diagnosis not present

## 2018-04-26 DIAGNOSIS — J4521 Mild intermittent asthma with (acute) exacerbation: Secondary | ICD-10-CM

## 2018-04-26 DIAGNOSIS — R0600 Dyspnea, unspecified: Secondary | ICD-10-CM | POA: Diagnosis not present

## 2018-04-26 DIAGNOSIS — R634 Abnormal weight loss: Secondary | ICD-10-CM

## 2018-04-26 DIAGNOSIS — F17210 Nicotine dependence, cigarettes, uncomplicated: Secondary | ICD-10-CM

## 2018-04-26 DIAGNOSIS — D500 Iron deficiency anemia secondary to blood loss (chronic): Secondary | ICD-10-CM

## 2018-04-26 DIAGNOSIS — C670 Malignant neoplasm of trigone of bladder: Secondary | ICD-10-CM

## 2018-04-26 DIAGNOSIS — J432 Centrilobular emphysema: Secondary | ICD-10-CM | POA: Diagnosis not present

## 2018-04-26 DIAGNOSIS — R110 Nausea: Secondary | ICD-10-CM | POA: Diagnosis not present

## 2018-04-26 DIAGNOSIS — C672 Malignant neoplasm of lateral wall of bladder: Secondary | ICD-10-CM

## 2018-04-26 DIAGNOSIS — R319 Hematuria, unspecified: Secondary | ICD-10-CM | POA: Diagnosis not present

## 2018-04-26 DIAGNOSIS — C679 Malignant neoplasm of bladder, unspecified: Secondary | ICD-10-CM

## 2018-04-26 LAB — URINALYSIS, COMPLETE (UACMP) WITH MICROSCOPIC
Bilirubin Urine: NEGATIVE
Glucose, UA: NEGATIVE mg/dL
Ketones, ur: NEGATIVE mg/dL
Nitrite: NEGATIVE
Protein, ur: >300 mg/dL — AB
RBC / HPF: >50 RBC/hpf (ref 0–5)
Specific Gravity, Urine: 1.025 (ref 1.005–1.030)
WBC, UA: >50 WBC/hpf (ref 0–5)
pH: 7.5 (ref 5.0–8.0)

## 2018-04-26 LAB — CBC WITH DIFFERENTIAL (CANCER CENTER ONLY)
Basophils Absolute: 0 10*3/uL (ref 0.0–0.1)
Basophils Relative: 0 %
Eosinophils Absolute: 0 10*3/uL (ref 0.0–0.5)
Eosinophils Relative: 0 %
HCT: 32.1 % — ABNORMAL LOW (ref 34.8–46.6)
Hemoglobin: 10.5 g/dL — ABNORMAL LOW (ref 11.6–15.9)
Lymphocytes Relative: 18 %
Lymphs Abs: 0.6 10*3/uL — ABNORMAL LOW (ref 0.9–3.3)
MCH: 31.5 pg (ref 26.0–34.0)
MCHC: 32.7 g/dL (ref 32.0–36.0)
MCV: 96.4 fL (ref 81.0–101.0)
Monocytes Absolute: 0.9 10*3/uL (ref 0.1–0.9)
Monocytes Relative: 28 %
Neutro Abs: 1.7 10*3/uL (ref 1.5–6.5)
Neutrophils Relative %: 54 %
Platelet Count: 366 10*3/uL (ref 145–400)
RBC: 3.33 MIL/uL — ABNORMAL LOW (ref 3.70–5.32)
RDW: 21.5 % — ABNORMAL HIGH (ref 11.1–15.7)
WBC Count: 3.2 10*3/uL — ABNORMAL LOW (ref 3.9–10.0)

## 2018-04-26 LAB — CMP (CANCER CENTER ONLY)
ALT: 23 U/L (ref 10–47)
AST: 27 U/L (ref 11–38)
Albumin: 3.8 g/dL (ref 3.5–5.0)
Alkaline Phosphatase: 108 U/L — ABNORMAL HIGH (ref 26–84)
Anion gap: 7 (ref 5–15)
BUN: 16 mg/dL (ref 7–22)
CO2: 25 mmol/L (ref 18–33)
Calcium: 10 mg/dL (ref 8.0–10.3)
Chloride: 104 mmol/L (ref 98–108)
Creatinine: 1.8 mg/dL — ABNORMAL HIGH (ref 0.60–1.20)
Glucose, Bld: 98 mg/dL (ref 73–118)
Potassium: 4.5 mmol/L (ref 3.3–4.7)
Sodium: 136 mmol/L (ref 128–145)
Total Bilirubin: 0.4 mg/dL (ref 0.2–1.6)
Total Protein: 7.5 g/dL (ref 6.4–8.1)

## 2018-04-26 MED ORDER — HEPARIN SOD (PORK) LOCK FLUSH 100 UNIT/ML IV SOLN
500.0000 [IU] | Freq: Once | INTRAVENOUS | Status: AC | PRN
  Administered 2018-04-26: 500 [IU]
  Filled 2018-04-26: qty 5

## 2018-04-26 MED ORDER — HYDROMORPHONE HCL 1 MG/ML IJ SOLN
INTRAMUSCULAR | Status: AC
  Filled 2018-04-26: qty 1

## 2018-04-26 MED ORDER — IPRATROPIUM-ALBUTEROL 0.5-2.5 (3) MG/3ML IN SOLN
3.0000 mL | Freq: Four times a day (QID) | RESPIRATORY_TRACT | Status: DC
  Administered 2018-04-26: 3 mL via RESPIRATORY_TRACT

## 2018-04-26 MED ORDER — IPRATROPIUM-ALBUTEROL 0.5-2.5 (3) MG/3ML IN SOLN
RESPIRATORY_TRACT | Status: AC
  Filled 2018-04-26: qty 3

## 2018-04-26 MED ORDER — OXYCODONE HCL 5 MG PO TABS: 5.0000 mg | ORAL_TABLET | Freq: Four times a day (QID) | ORAL | 0 refills | Status: DC | PRN

## 2018-04-26 MED ORDER — NITROFURANTOIN MONOHYD MACRO 100 MG PO CAPS: 100.0000 mg | ORAL_CAPSULE | Freq: Two times a day (BID) | ORAL | 2 refills | Status: DC

## 2018-04-26 MED ORDER — HYDROMORPHONE HCL 1 MG/ML IJ SOLN
1.0000 mg | Freq: Once | INTRAMUSCULAR | Status: AC
  Administered 2018-04-26: 1 mg via INTRAVENOUS

## 2018-04-26 MED ORDER — SODIUM CHLORIDE 0.9% FLUSH
10.0000 mL | Freq: Once | INTRAVENOUS | Status: AC | PRN
  Administered 2018-04-26: 10 mL
  Filled 2018-04-26: qty 10

## 2018-04-26 MED ORDER — LORAZEPAM 0.5 MG PO TABS: 0.5000 mg | ORAL_TABLET | Freq: Four times a day (QID) | ORAL | 0 refills | Status: DC | PRN

## 2018-04-26 MED ORDER — BUDESONIDE-FORMOTEROL FUMARATE 160-4.5 MCG/ACT IN AERO: 2.0000 | INHALATION_SPRAY | Freq: Two times a day (BID) | RESPIRATORY_TRACT | 0 refills | Status: DC

## 2018-04-26 MED ORDER — SODIUM CHLORIDE 0.9 % IV SOLN
INTRAVENOUS | Status: DC
  Administered 2018-04-26: 15:00:00 via INTRAVENOUS
  Filled 2018-04-26 (×2): qty 250

## 2018-04-26 MED ORDER — VARENICLINE TARTRATE 0.5 MG X 11 & 1 MG X 42 PO MISC: ORAL | 0 refills | Status: DC

## 2018-04-26 NOTE — Progress Notes (Signed)
Hematology and Oncology Follow Up Visit  Donna Curry [id number] 05/02/1956 62 y.o. 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  She completed her radiation therapy a couple weeks ago.  She did not receive all of the cis-platinum this because of side effect issues.  Her problem right now is the fact that there is issues with her asthma.  Of course, she does not have a family doctor.  Thankfully, we were able to get her in to see Dr. Lake Bells across the hall from [place].  I spoke with him in.  He was incredibly kind to see her today.  Looks like she is having some urinary issues.  She has white cells and blood in the urine.  We are sending this off for culture.  I will probably put her on Macrobid long-term.  She sees her urologist at the end of October for a cystoscopy.  She still having quite a bit of pain.  This is no surprise.  Her weight has not gone up.  She said that she is eating better.  She is on Megace and Marinol.  She is still smoking.  She says that she is down to 3 cigarettes a day.  There is been no fever.  She has had some nausea.  Lorazepam seems to help with this the most.  There is no diarrhea.  She has had no leg swelling.  Overall, her performance status is probably ECOG 2.    Medications:  Current Outpatient Medications:  .  acetaminophen (TYLENOL) 500 MG tablet, Take 1,000 mg by mouth every 8 (eight) hours as needed for moderate pain., Disp: , Rfl:  .  amLODipine (NORVASC) 10 MG tablet, Take 10 mg by mouth daily., Disp: , Rfl:  .  docusate sodium (COLACE) 100 MG capsule, Take 1 capsule (100 mg total) by mouth 2 (two) times daily., Disp: 10 capsule, Rfl: 0 .  Doxepin HCl (SILENOR) 3 MG TABS,  Take 1 tablet (3 mg total) by mouth at bedtime., Disp: 30 tablet, Rfl: 2 .  Ipratropium-Albuterol (COMBIVENT) 20-100 MCG/ACT AERS respimat, Inhale 1 puff into the lungs every 6 (six) hours., Disp: 2 Inhaler, Rfl: 3 .  lactulose (CHRONULAC) 10 GM/15ML solution, Take 15 mLs (10 g total) by mouth 2 (two) times daily as needed for mild constipation or moderate constipation., Disp: 473 mL, Rfl: 1 .  lidocaine-prilocaine (EMLA) cream, Apply to affected area once, Disp: 30 g, Rfl: 3 .  LORazepam (ATIVAN) 0.5 MG tablet, Take 1 tablet (0.5 mg total) by mouth every 6 (six) hours as needed for anxiety (Nausea or vomiting)., Disp: 30 tablet, Rfl: 0 .  MARINOL 2.5 MG capsule, TAKE 1 CAPSULE BY MOUTH TWICE DAILY BEFORE A MEAL, Disp: 60 capsule, Rfl: 0 .  megestrol (MEGACE ES) 625 MG/5ML suspension, Take 5 mLs (625 mg total) by mouth daily., Disp: 150 mL, Rfl: 3 .  nitrofurantoin, macrocrystal-monohydrate, (MACROBID) 100 MG capsule, Take 1 capsule (100 mg total) by mouth 2 (two) times daily., Disp: 60 capsule, Rfl: 2 .  ondansetron (ZOFRAN) 8 MG tablet, Take 1 tablet (8 mg total) by mouth 2 (two) times daily as needed. Start on the third day after chemotherapy., Disp: 30 tablet, Rfl: 1 .  oxandrolone (  OXANDRIN) 10 MG tablet, Take 1 tablet (10 mg total) by mouth daily., Disp: 30 tablet, Rfl: 3 .  oxyCODONE (OXY IR/ROXICODONE) 5 MG immediate release tablet, Take 1 tablet (5 mg total) by mouth every 6 (six) hours as needed for severe pain., Disp: 60 tablet, Rfl: 0 .  prochlorperazine (COMPAZINE) 10 MG tablet, Take 1 tablet (10 mg total) by mouth every 6 (six) hours as needed (Nausea or vomiting)., Disp: 30 tablet, Rfl: 1 .  Vitamin D, Ergocalciferol, (DRISDOL) 50000 units CAPS capsule, Take 1 capsule (50,000 Units total) by mouth every 7 (seven) days., Disp: 8 capsule, Rfl: 0  Current Facility-Administered Medications:  .  ipratropium-albuterol (DUONEB) 0.5-2.5 (3) MG/3ML nebulizer solution 3 mL, 3 mL, Nebulization,  Q6H, Gerrard Crystal, [name], MD, 3 mL at 04/26/18 1337  Facility-Administered Medications Ordered in Other Visits:  .  heparin lock flush 100 unit/mL, 500 Units, Intracatheter, Once PRN, [name], MD .  HYDROmorphone (DILAUDID) injection 1 mg, 1 mg, Intravenous, Once, Vayden Weinand R, MD .  sodium chloride flush (NS) 0.9 % injection 10 mL, 10 mL, Intracatheter, Once PRN, Marin Olp, [name], MD  Allergies: No Known Allergies  Past Medical History, Surgical history, Social history, and Family History were reviewed and updated.  Review of Systems: Review of Systems  Constitutional: Negative.   HENT:  Negative.   Eyes: Negative.   Respiratory: Negative.   Cardiovascular: Negative.   Gastrointestinal: Negative.   Endocrine: Negative.   Genitourinary: Negative.    Musculoskeletal: Negative.   Skin: Negative.   Neurological: Negative.   Hematological: Negative.   Psychiatric/Behavioral: Negative.     Physical Exam:  weight is 71 lb 4 oz (32.3 kg). Her oral temperature is 98.1 F (36.7 C). Her blood pressure is 126/85 and her pulse is 116 (abnormal). Her respiration is 18 and oxygen saturation is 100%.   Wt Readings from Last 3 Encounters:  04/26/18 71 lb 4 oz (32.3 kg)  04/05/18 72 lb (32.7 kg)  03/29/18 75 lb (34 kg)    Physical Exam  Constitutional: She is oriented to person, place, and time.  HENT:  Head: Normocephalic and atraumatic.  Mouth/Throat: Oropharynx is clear and moist.  Eyes: Pupils are equal, round, and reactive to light. EOM are normal.  Neck: Normal range of motion.  Cardiovascular: Normal rate, regular rhythm and normal heart sounds.  Pulmonary/Chest: Effort normal and breath sounds normal.  Abdominal: Soft. Bowel sounds are normal.  Her abdominal exam is soft.  She has decent bowel sounds.  There might be some fullness in the hypoumbilical area.  She has no fluid wave.  There is no palpable liver or spleen tip.  Musculoskeletal: Normal range of motion.  She exhibits no edema, tenderness or deformity.  Lymphadenopathy:    She has no cervical adenopathy.  Neurological: She is alert and oriented to person, place, and time.  Skin: Skin is warm and dry. No rash noted. No erythema.  Psychiatric: She has a normal mood and affect. Her behavior is normal. Judgment and thought content normal.  Vitals reviewed.    Lab Results  Component Value Date   WBC 3.2 (L) 04/26/2018   HGB 10.5 (L) 04/26/2018   HCT 32.1 (L) 04/26/2018   MCV 96.4 04/26/2018   PLT 366 04/26/2018     Chemistry      Component Value Date/Time   NA 136 04/26/2018 1147   K 4.5 04/26/2018 1147   CL 104 04/26/2018 1147   CO2 25 04/26/2018 1147  BUN 16 04/26/2018 1147   CREATININE 1.80 (H) 04/26/2018 1147   CREATININE 1.10 08/20/2013 1027      Component Value Date/Time   CALCIUM 10.0 04/26/2018 1147   ALKPHOS 108 (H) 04/26/2018 1147   AST 27 04/26/2018 1147   ALT 23 04/26/2018 1147   BILITOT 0.4 04/26/2018 1147         Impression and Plan: Donna Curry is a 62 year old African-American female.  She has locally advanced high-grade urothelial carcinoma of the bladder.  Hopefully, she would have had a very good response to radiation and low-dose cis-platinum.  The cystoscopy will hopefully show this when she has this done in about 4 weeks.  Hopefully, her weight will start going up.  I know that her sister, comes with her, is doing her best to try to get her to gain weight and eat.  We will see what her urine culture shows.  We will give her some IV fluids in the office today.  I think she could probably use some IV fluid.  We will also give her some IV pain medication.  I will call in some Ativan to help with her nausea.  I will also call in some oxycodone for her pain.  Again, I very much appreciate the help from Dr. Lake Bells.  I will set her up with a PET scan in about 5 weeks or so.  Hopefully, we will see that she has had a very good or complete  response.  [name], MD 10/3/20192:07 PM

## 2018-04-26 NOTE — Progress Notes (Signed)
Synopsis: Centrilobular emphysema, first seen in October 2019.  Also has bladder cancer receiving treatment by [name] as of October 2019.  Has smoked 1/2 pack of cigarettes daily and is age 62, currently smoking 3 cigarettes a day as of October 2019.  Subjective:    Patient ID: Donna Curry, female    DOB: Dec 05, 1955, 62 y.o.   MRN: [id number]  HPI Chief Complaint  Patient presents with  . Pulm Consult    Referred by [name] for history of asthma. Patient states that she is unable to walk long distances without getting SOB.    Dyspnea: > has been happening since she started chemotherapy 2 months ago > she has been taking inhalers which  > she has never been told she had a lung problem > she smoked 1/2 ppd for many years, at least 23 years (started age 50) > still smoking as of now, 3 cigarettes today > she is considering chantix  > she doesn't cough > denies leg pain > no leg swelling > she can walk about 50 feet before getting short of breath  She tells me that the shortness of breath has been more of a problem for her for the last 2 months since she started her cancer treatments.  She does not have chest pain, mucus production or cough.  She used to cough in the past but she says that this was due to a blood pressure medicine and she no longer has this problem.  She takes albuterol from time to time which helps some but she still feels short of breath when she exerts herself.   Past Medical History:  Diagnosis Date  . Anemia   . Family history of adverse reaction to anesthesia    brother wakes up and dont know who he is and sister has n/v  . Goals of care, counseling/discussion 02/09/2018  . History of kidney stones   . Hypertension   . Iron deficiency anemia due to chronic blood loss 03/01/2018  . Measles as a child  . Mumps as a child  . TIA (transient ischemic attack)   . Tobacco abuse      Family History  Problem Relation Age of Onset  . Hypertension  Mother   . Diabetes Father   . Hypertension Brother   . HIV Brother      Social History   Socioeconomic History  . Marital status: Single    Spouse name: Not on file  . Number of children: Not on file  . Years of education: Not on file  . Highest education level: Not on file  Occupational History  . Not on file  Social Needs  . Financial resource strain: Not on file  . Food insecurity:    Worry: Not on file    Inability: Not on file  . Transportation needs:    Medical: Not on file    Non-medical: Not on file  Tobacco Use  . Smoking status: Current Some Day Smoker    Packs/day: 0.50    Types: Cigarettes    Last attempt to quit: 03/22/2013    Years since quitting: 5.0  . Smokeless tobacco: Never Used  . Tobacco comment: 1/4 pack per day,Hasnt smoked in 8 days  Substance and Sexual Activity  . Alcohol use: No    Alcohol/week: 0.0 standard drinks  . Drug use: Yes    Comment: marijuana denies at preop  . Sexual activity: Not Currently  Lifestyle  . Physical activity:  Days per week: Not on file    Minutes per session: Not on file  . Stress: Not on file  Relationships  . Social connections:    Talks on phone: Not on file    Gets together: Not on file    Attends religious service: Not on file    Active member of club or organization: Not on file    Attends meetings of clubs or organizations: Not on file    Relationship status: Not on file  . Intimate partner violence:    Fear of current or ex partner: Not on file    Emotionally abused: Not on file    Physically abused: Not on file    Forced sexual activity: Not on file  Other Topics Concern  . Not on file  Social History Narrative   Denies hx of drug use   Single   1 daughter age 56 lives with daughter and grandson who is 72.   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     No Known Allergies   Outpatient Medications Prior to Visit  Medication Sig Dispense Refill  .  acetaminophen (TYLENOL) 500 MG tablet Take 1,000 mg by mouth every 8 (eight) hours as needed for moderate pain.    Marland Kitchen amLODipine (NORVASC) 10 MG tablet Take 10 mg by mouth daily.    Marland Kitchen docusate sodium (COLACE) 100 MG capsule Take 1 capsule (100 mg total) by mouth 2 (two) times daily. 10 capsule 0  . Doxepin HCl (SILENOR) 3 MG TABS Take 1 tablet (3 mg total) by mouth at bedtime. 30 tablet 2  . Ipratropium-Albuterol (COMBIVENT) 20-100 MCG/ACT AERS respimat Inhale 1 puff into the lungs every 6 (six) hours. 2 Inhaler 3  . lactulose (CHRONULAC) 10 GM/15ML solution Take 15 mLs (10 g total) by mouth 2 (two) times daily as needed for mild constipation or moderate constipation. 473 mL 1  . lidocaine-prilocaine (EMLA) cream Apply to affected area once 30 g 3  . LORazepam (ATIVAN) 0.5 MG tablet Take 1 tablet (0.5 mg total) by mouth every 6 (six) hours as needed for anxiety (Nausea or vomiting). 30 tablet 0  . MARINOL 2.5 MG capsule TAKE 1 CAPSULE BY MOUTH TWICE DAILY BEFORE A MEAL 60 capsule 0  . megestrol (MEGACE ES) 625 MG/5ML suspension Take 5 mLs (625 mg total) by mouth daily. 150 mL 3  . nitrofurantoin, macrocrystal-monohydrate, (MACROBID) 100 MG capsule Take 1 capsule (100 mg total) by mouth 2 (two) times daily. 60 capsule 2  . ondansetron (ZOFRAN) 8 MG tablet Take 1 tablet (8 mg total) by mouth 2 (two) times daily as needed. Start on the third day after chemotherapy. 30 tablet 1  . oxandrolone (OXANDRIN) 10 MG tablet Take 1 tablet (10 mg total) by mouth daily. 30 tablet 3  . oxyCODONE (OXY IR/ROXICODONE) 5 MG immediate release tablet Take 1 tablet (5 mg total) by mouth every 6 (six) hours as needed for severe pain. 60 tablet 0  . prochlorperazine (COMPAZINE) 10 MG tablet Take 1 tablet (10 mg total) by mouth every 6 (six) hours as needed (Nausea or vomiting). 30 tablet 1  . Vitamin D, Ergocalciferol, (DRISDOL) 50000 units CAPS capsule Take 1 capsule (50,000 Units total) by mouth every 7 (seven) days. 8  capsule 0   Facility-Administered Medications Prior to Visit  Medication Dose Route Frequency Provider Last Rate Last Dose  . heparin lock flush 100 unit/mL  500 Units Intracatheter Once PRN [name],  [name], MD      . HYDROmorphone (DILAUDID) injection 1 mg  1 mg Intravenous Once [name], MD      . ipratropium-albuterol (DUONEB) 0.5-2.5 (3) MG/3ML nebulizer solution 3 mL  3 mL Nebulization Q6H [name], MD   3 mL at 04/26/18 1337  . sodium chloride flush (NS) 0.9 % injection 10 mL  10 mL Intracatheter Once PRN [name], MD          Review of Systems  Constitutional: Negative for fever and unexpected weight change.  HENT: Negative for congestion, dental problem, ear pain, nosebleeds, postnasal drip, rhinorrhea, sinus pressure, sneezing, sore throat and trouble swallowing.   Eyes: Negative for redness and itching.  Respiratory: Positive for shortness of breath. Negative for cough, chest tightness and wheezing.   Cardiovascular: Negative for palpitations and leg swelling.  Gastrointestinal: Negative for nausea and vomiting.  Genitourinary: Negative for dysuria.  Musculoskeletal: Negative for joint swelling.  Skin: Negative for rash.  Allergic/Immunologic: Negative.  Negative for environmental allergies, food allergies and immunocompromised state.  Neurological: Negative for headaches.  Hematological: Does not bruise/bleed easily.  Psychiatric/Behavioral: Negative for dysphoric mood. The patient is not nervous/anxious.        Objective:   Physical Exam Vitals:   04/26/18 1408  BP: 101/74  Pulse: 91  SpO2: 96%  Weight: 71 lb 4 oz (32.3 kg)  Height: 5\' 4"  (1.626 m)    RA  Gen: thin, chronically ill appearing, no acute distress HENT: NCAT, OP clear, neck supple without masses Eyes: PERRL, EOMi Lymph: no cervical lymphadenopathy PULM: Poor air movement, no wheezing CV: RRR, no mgr, no JVD GI: BS+, soft, nontender, no hsm Derm: no rash or skin  breakdown MSK: normal bulk and tone Neuro: A&Ox4, CN II-XII intact, strength 5/5 in all 4 extremities Psyche: normal mood and affect   CBC    Component Value Date/Time   WBC 3.2 (L) 04/26/2018 1147   WBC 4.4 03/29/2018 0910   RBC 3.33 (L) 04/26/2018 1147   HGB 10.5 (L) 04/26/2018 1147   HCT 32.1 (L) 04/26/2018 1147   PLT 366 04/26/2018 1147   MCV 96.4 04/26/2018 1147   MCH 31.5 04/26/2018 1147   MCHC 32.7 04/26/2018 1147   RDW 21.5 (H) 04/26/2018 1147   LYMPHSABS 0.6 (L) 04/26/2018 1147   MONOABS 0.9 04/26/2018 1147   EOSABS 0.0 04/26/2018 1147   BASOSABS 0.0 04/26/2018 1147        Assessment & Plan:   Centrilobular emphysema (HCC)  Dyspnea, unspecified type  Malignant neoplasm of urinary bladder, unspecified site [facility])  Cigarette smoker  Discussion: This is a pleasant 62 year old female who has bladder cancer currently undergoing chemotherapy treatments as directed by [name] who comes to my clinic today for evaluation of shortness of breath.  She has centrilobular emphysema seen on CT scan of her chest from April 2019.  This is clearly a significant contributor to her shortness of breath.  Fortunately she does not seem to have an exacerbation right now.  We talked about how emphysema is due to her tobacco use and will continue to worsen until she quit smoking.  I strongly encouraged her to quit.  She is interested in quitting and would like to try Chantix.  Plan: Tobacco abuse: It is very important that she quit smoking because you have emphysema and this will continue to worsen as long as you continue to smoke cigarettes. Try taking Chantix as directed Its important that  you understand that Chantix can cause abnormal dreams or sometimes change your emotions, the way you think, or the way you feel.  Make sure that your friends and family know that you are taking this medicine so that they can help you identify any of these problems.  If you notice any of these  problems stop taking the medicine right away and call me.  Centrilobular emphysema: This term means that there is damage done to your lungs from tobacco use. Take Symbicort 2 puffs twice a day through a spacer no matter how you feel Keep using the albuterol- ipratropium prescribed by [name] as needed for shortness of breath Talk to [name] about a flu shot We will check a spirometry test today We will check your oxygen level while walking today  We will see you back in 1 to 2 weeks with either myself or a nurse practitioner to make sure you are getting better      Current Outpatient Medications:  .  acetaminophen (TYLENOL) 500 MG tablet, Take 1,000 mg by mouth every 8 (eight) hours as needed for moderate pain., Disp: , Rfl:  .  amLODipine (NORVASC) 10 MG tablet, Take 10 mg by mouth daily., Disp: , Rfl:  .  docusate sodium (COLACE) 100 MG capsule, Take 1 capsule (100 mg total) by mouth 2 (two) times daily., Disp: 10 capsule, Rfl: 0 .  Doxepin HCl (SILENOR) 3 MG TABS, Take 1 tablet (3 mg total) by mouth at bedtime., Disp: 30 tablet, Rfl: 2 .  Ipratropium-Albuterol (COMBIVENT) 20-100 MCG/ACT AERS respimat, Inhale 1 puff into the lungs every 6 (six) hours., Disp: 2 Inhaler, Rfl: 3 .  lactulose (CHRONULAC) 10 GM/15ML solution, Take 15 mLs (10 g total) by mouth 2 (two) times daily as needed for mild constipation or moderate constipation., Disp: 473 mL, Rfl: 1 .  lidocaine-prilocaine (EMLA) cream, Apply to affected area once, Disp: 30 g, Rfl: 3 .  LORazepam (ATIVAN) 0.5 MG tablet, Take 1 tablet (0.5 mg total) by mouth every 6 (six) hours as needed for anxiety (Nausea or vomiting)., Disp: 30 tablet, Rfl: 0 .  MARINOL 2.5 MG capsule, TAKE 1 CAPSULE BY MOUTH TWICE DAILY BEFORE A MEAL, Disp: 60 capsule, Rfl: 0 .  megestrol (MEGACE ES) 625 MG/5ML suspension, Take 5 mLs (625 mg total) by mouth daily., Disp: 150 mL, Rfl: 3 .  nitrofurantoin, macrocrystal-monohydrate, (MACROBID) 100 MG capsule,  Take 1 capsule (100 mg total) by mouth 2 (two) times daily., Disp: 60 capsule, Rfl: 2 .  ondansetron (ZOFRAN) 8 MG tablet, Take 1 tablet (8 mg total) by mouth 2 (two) times daily as needed. Start on the third day after chemotherapy., Disp: 30 tablet, Rfl: 1 .  oxandrolone (OXANDRIN) 10 MG tablet, Take 1 tablet (10 mg total) by mouth daily., Disp: 30 tablet, Rfl: 3 .  oxyCODONE (OXY IR/ROXICODONE) 5 MG immediate release tablet, Take 1 tablet (5 mg total) by mouth every 6 (six) hours as needed for severe pain., Disp: 60 tablet, Rfl: 0 .  prochlorperazine (COMPAZINE) 10 MG tablet, Take 1 tablet (10 mg total) by mouth every 6 (six) hours as needed (Nausea or vomiting)., Disp: 30 tablet, Rfl: 1 .  Vitamin D, Ergocalciferol, (DRISDOL) 50000 units CAPS capsule, Take 1 capsule (50,000 Units total) by mouth every 7 (seven) days., Disp: 8 capsule, Rfl: 0 .  varenicline (CHANTIX PAK) 0.5 MG X 11 & 1 MG X 42 tablet, Take one 0.5 mg tablet by mouth once daily for 3 days,  then increase to one 0.5 mg tablet twice daily for 4 days, then increase to one 1 mg tablet twice daily., Disp: 53 tablet, Rfl: 0 No current facility-administered medications for this visit.   Facility-Administered Medications Ordered in Other Visits:  .  heparin lock flush 100 unit/mL, 500 Units, Intracatheter, Once PRN, [name], [name], MD .  HYDROmorphone (DILAUDID) injection 1 mg, 1 mg, Intravenous, Once, [name], [name], MD .  ipratropium-albuterol (DUONEB) 0.5-2.5 (3) MG/3ML nebulizer solution 3 mL, 3 mL, Nebulization, Q6H, [name], [name], MD, 3 mL at 04/26/18 1337 .  sodium chloride flush (NS) 0.9 % injection 10 mL, 10 mL, Intracatheter, Once PRN, Marin Olp, [name], MD

## 2018-04-26 NOTE — Progress Notes (Signed)
Hydromorphone syringe malfunctioned.  Wasted by RN and not given to the patient.  Syringe came apart and all liquid contained in the syringe spilled into the floor, I saw this myself.

## 2018-04-26 NOTE — Progress Notes (Signed)
Patient did not receive dilaudid medication as syringe malfunction occurred during administration. Patient escorted to her pulmonology appointment by [name] before missed dilaudid dose could be re administered.

## 2018-04-26 NOTE — Patient Instructions (Signed)
Dehydration, Adult Dehydration is a condition in which there is not enough fluid or water in the body. This happens when you lose more fluids than you take in. Important organs, such as the kidneys, brain, and heart, cannot function without a proper amount of fluids. Any loss of fluids from the body can lead to dehydration. Dehydration can range from mild to severe. This condition should be treated right away to prevent it from becoming severe. What are the causes? This condition may be caused by:  Vomiting.  Diarrhea.  Excessive sweating, such as from heat exposure or exercise.  Not drinking enough fluid, especially: ? When ill. ? While doing activity that requires a lot of energy.  Excessive urination.  Fever.  Infection.  Certain medicines, such as medicines that cause the body to lose excess fluid (diuretics).  Inability to access safe drinking water.  Reduced physical ability to get adequate water and food.  What increases the risk? This condition is more likely to develop in people:  Who have a poorly controlled long-term (chronic) illness, such as diabetes, heart disease, or kidney disease.  Who are age 65 or older.  Who are disabled.  Who live in a place with high altitude.  Who play endurance sports.  What are the signs or symptoms? Symptoms of mild dehydration may include:  Thirst.  Dry lips.  Slightly dry mouth.  Dry, warm skin.  Dizziness. Symptoms of moderate dehydration may include:  Very dry mouth.  Muscle cramps.  Dark urine. Urine may be the color of tea.  Decreased urine production.  Decreased tear production.  Heartbeat that is irregular or faster than normal (palpitations).  Headache.  Light-headedness, especially when you stand up from a sitting position.  Fainting (syncope). Symptoms of severe dehydration may include:  Changes in skin, such as: ? Cold and clammy skin. ? Blotchy (mottled) or pale skin. ? Skin that does  not quickly return to normal after being lightly pinched and released (poor skin turgor).  Changes in body fluids, such as: ? Extreme thirst. ? No tear production. ? Inability to sweat when body temperature is high, such as in hot weather. ? Very little urine production.  Changes in vital signs, such as: ? Weak pulse. ? Pulse that is more than 100 beats a minute when sitting still. ? Rapid breathing. ? Low blood pressure.  Other changes, such as: ? Sunken eyes. ? Cold hands and feet. ? Confusion. ? Lack of energy (lethargy). ? Difficulty waking up from sleep. ? Short-term weight loss. ? Unconsciousness. How is this diagnosed? This condition is diagnosed based on your symptoms and a physical exam. Blood and urine tests may be done to help confirm the diagnosis. How is this treated? Treatment for this condition depends on the severity. Mild or moderate dehydration can often be treated at home. Treatment should be started right away. Do not wait until dehydration becomes severe. Severe dehydration is an emergency and it needs to be treated in a hospital. Treatment for mild dehydration may include:  Drinking more fluids.  Replacing salts and minerals in your blood (electrolytes) that you may have lost. Treatment for moderate dehydration may include:  Drinking an oral rehydration solution (ORS). This is a drink that helps you replace fluids and electrolytes (rehydrate). It can be found at pharmacies and retail stores. Treatment for severe dehydration may include:  Receiving fluids through an IV tube.  Receiving an electrolyte solution through a feeding tube that is passed through your nose   and into your stomach (nasogastric tube, or NG tube).  Correcting any abnormalities in electrolytes.  Treating the underlying cause of dehydration. Follow these instructions at home:  If directed by your health care provider, drink an ORS: ? Make an ORS by following instructions on the  package. ? Start by drinking small amounts, about  cup (120 mL) every 5-10 minutes. ? Slowly increase how much you drink until you have taken the amount recommended by your health care provider.  Drink enough clear fluid to keep your urine clear or pale yellow. If you were told to drink an ORS, finish the ORS first, then start slowly drinking other clear fluids. Drink fluids such as: ? Water. Do not drink only water. Doing that can lead to having too little salt (sodium) in the body (hyponatremia). ? Ice chips. ? Fruit juice that you have added water to (diluted fruit juice). ? Low-calorie sports drinks.  Avoid: ? Alcohol. ? Drinks that contain a lot of sugar. These include high-calorie sports drinks, fruit juice that is not diluted, and soda. ? Caffeine. ? Foods that are greasy or contain a lot of fat or sugar.  Take over-the-counter and prescription medicines only as told by your health care provider.  Do not take sodium tablets. This can lead to having too much sodium in the body (hypernatremia).  Eat foods that contain a healthy balance of electrolytes, such as bananas, oranges, potatoes, tomatoes, and spinach.  Keep all follow-up visits as told by your health care provider. This is important. Contact a health care provider if:  You have abdominal pain that: ? Gets worse. ? Stays in one area (localizes).  You have a rash.  You have a stiff neck.  You are more irritable than usual.  You are sleepier or more difficult to wake up than usual.  You feel weak or dizzy.  You feel very thirsty.  You have urinated only a small amount of very dark urine over 6-8 hours. Get help right away if:  You have symptoms of severe dehydration.  You cannot drink fluids without vomiting.  Your symptoms get worse with treatment.  You have a fever.  You have a severe headache.  You have vomiting or diarrhea that: ? Gets worse. ? Does not go away.  You have blood or green matter  (bile) in your vomit.  You have blood in your stool. This may cause stool to look black and tarry.  You have not urinated in 6-8 hours.  You faint.  Your heart rate while sitting still is over 100 beats a minute.  You have trouble breathing. This information is not intended to replace advice given to you by your health care provider. Make sure you discuss any questions you have with your health care provider. Document Released: 07/11/2005 Document Revised: 02/05/2016 Document Reviewed: 09/04/2015 Elsevier Interactive Patient Education  2018 [employer]  

## 2018-04-26 NOTE — Patient Instructions (Signed)
Tobacco abuse: It is very important that she quit smoking because you have emphysema and this will continue to worsen as long as you continue to smoke cigarettes. Try taking Chantix as directed Its important that you understand that Chantix can cause abnormal dreams or sometimes change your emotions, the way you think, or the way you feel.  Make sure that your friends and family know that you are taking this medicine so that they can help you identify any of these problems.  If you notice any of these problems stop taking the medicine right away and call me.  Centrilobular emphysema: This term means that there is damage done to your lungs from tobacco use. Take Symbicort 2 puffs twice a day through a spacer no matter how you feel Keep using the albuterol- ipratropium prescribed by [name] as needed for shortness of breath Talk to [name] about a flu shot We will check a spirometry test today We will check your oxygen level while walking today  We will see you back in 1 to 2 weeks with either myself or a nurse practitioner to make sure you are getting better

## 2018-04-27 LAB — URINE CULTURE: Culture: NO GROWTH

## 2018-04-27 LAB — IRON AND TIBC
Iron: 84 ug/dL (ref 41–142)
Saturation Ratios: 31 % (ref 21–57)
TIBC: 271 ug/dL (ref 236–444)
UIBC: 187 ug/dL

## 2018-04-27 LAB — FERRITIN: Ferritin: 978 ng/mL — ABNORMAL HIGH (ref 11–307)

## 2018-04-27 LAB — TSH: TSH: 1.291 u[IU]/mL (ref 0.308–3.960)

## 2018-05-01 ENCOUNTER — Encounter: Payer: Self-pay | Admitting: Family

## 2018-05-01 ENCOUNTER — Telehealth: Payer: Self-pay | Admitting: Family

## 2018-05-01 NOTE — Telephone Encounter (Signed)
Please contact pt to arrange a follow up appointment with me and also give her number to call for GI for colonoscopy- looks like they could not get in touch with her.

## 2018-05-01 NOTE — Telephone Encounter (Signed)
Patient was scheduled to come in next week on Wednesday. She said Dr. Tresa Res advised her to concentrate on her cancer treatment at this time and once she is better and finished she can go ahead with colonoscopy.

## 2018-05-02 ENCOUNTER — Other Ambulatory Visit: Payer: Self-pay | Admitting: Hematology & Oncology

## 2018-05-02 DIAGNOSIS — N3000 Acute cystitis without hematuria: Secondary | ICD-10-CM

## 2018-05-09 ENCOUNTER — Ambulatory Visit: Payer: Medicare Other | Admitting: Family

## 2018-05-15 ENCOUNTER — Ambulatory Visit: Payer: Medicare Other | Admitting: Family

## 2018-05-18 ENCOUNTER — Ambulatory Visit (HOSPITAL_BASED_OUTPATIENT_CLINIC_OR_DEPARTMENT_OTHER)
Admission: RE | Admit: 2018-05-18 | Discharge: 2018-05-18 | Disposition: A | Discharge status: Home / Self Care | Payer: Medicare Other | Source: Ambulatory Visit | Attending: Family | Admitting: Family

## 2018-05-18 ENCOUNTER — Encounter: Payer: Self-pay | Admitting: Family

## 2018-05-18 ENCOUNTER — Ambulatory Visit (INDEPENDENT_AMBULATORY_CARE_PROVIDER_SITE_OTHER): Payer: Medicare Other | Admitting: Family

## 2018-05-18 VITALS — BP 100/64 | HR 106 | Temp 97.8°F | Resp 16 | Ht 64.0 in | Wt 73.6 lb

## 2018-05-18 DIAGNOSIS — R29898 Other symptoms and signs involving the musculoskeletal system: Secondary | ICD-10-CM | POA: Diagnosis not present

## 2018-05-18 DIAGNOSIS — R29818 Other symptoms and signs involving the nervous system: Secondary | ICD-10-CM | POA: Diagnosis not present

## 2018-05-18 DIAGNOSIS — I10 Essential (primary) hypertension: Secondary | ICD-10-CM | POA: Diagnosis not present

## 2018-05-18 DIAGNOSIS — E559 Vitamin D deficiency, unspecified: Secondary | ICD-10-CM

## 2018-05-18 DIAGNOSIS — G9389 Other specified disorders of brain: Secondary | ICD-10-CM | POA: Diagnosis not present

## 2018-05-18 DIAGNOSIS — C679 Malignant neoplasm of bladder, unspecified: Secondary | ICD-10-CM

## 2018-05-18 MED ORDER — VARENICLINE TARTRATE 0.5 MG X 11 & 1 MG X 42 PO MISC: ORAL | 0 refills | Status: DC

## 2018-05-18 NOTE — Progress Notes (Signed)
Subjective:    Patient ID: Donna Curry, female    DOB: [birth year], 62 y.o.   MRN: [id number]  HPI   Donna Curry is a 62 yr old female who presents today for follow up.   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  Her current therapy includes:  Radiation/low-dose weekly cis-platinum -- s/p cycle #5 and IV iron as indicated  Oncology has been trying to help her with weight gain with use of megace and marinol.  Wt Readings from Last 3 Encounters:  05/18/18 73 lb 9.6 oz (33.4 kg)  04/26/18 71 lb 4 oz (32.3 kg)  04/26/18 71 lb 4 oz (32.3 kg)   Vit D deficiency- was treated with 50000 units weekly back in April.    COPD- following with Dr. Lake Bells who placed her on chantix to help with smoking cessation.   HTN-  rx for amlodipine 10mg .  BP Readings from Last 3 Encounters:  05/18/18 100/64  04/26/18 101/74  04/26/18 126/85   Woke up Sunday AM unable to use her left hand. No pain, no numbness.  No facial drooping, slurred speech. "can't even hold a glass with my left hand."     Review of Systems See HPI  Past Medical History:  Diagnosis Date  . Anemia   . Family history of adverse reaction to anesthesia    brother wakes up and dont know who he is and sister has n/v  . Goals of care, counseling/discussion 02/09/2018  . History of kidney stones   . Hypertension   . Iron deficiency anemia due to chronic blood loss 03/01/2018  . Measles as a child  . Mumps as a child  . TIA (transient ischemic attack)   . Tobacco abuse      Social History   Socioeconomic History  . Marital status: Single    Spouse name: Not on file  . Number of children: Not on file  . Years of education: Not on file  . Highest education level: Not on file  Occupational History  . Not on file  Social Needs  . Financial resource strain: Not on  file  . Food insecurity:    Worry: Not on file    Inability: Not on file  . Transportation needs:    Medical: Not on file    Non-medical: Not on file  Tobacco Use  . Smoking status: Current Some Day Smoker    Packs/day: 0.50    Types: Cigarettes    Last attempt to quit: 03/22/2013    Years since quitting: 5.1  . Smokeless tobacco: Never Used  . Tobacco comment: 1/4 pack per day,Hasnt smoked in 8 days  Substance and Sexual Activity  . Alcohol use: No    Alcohol/week: 0.0 standard drinks  . Drug use: Yes    Comment: marijuana denies at preop  . Sexual activity: Not Currently  Lifestyle  . Physical activity:    Days per week: Not on file    Minutes per session: Not on file  . Stress: Not on file  Relationships  . Social connections:    Talks on phone: Not on file    Gets together: Not on file    Attends religious service: Not on file    Active member of club or organization: Not on file    Attends meetings of clubs or organizations: Not  on file    Relationship status: Not on file  . Intimate partner violence:    Fear of current or ex partner: Not on file    Emotionally abused: Not on file    Physically abused: Not on file    Forced sexual activity: Not on file  Other Topics Concern  . Not on file  Social History Narrative   Denies hx of drug use   Single   1 daughter age 28 lives with daughter and grandson who is 3.   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    Past Surgical History:  Procedure Laterality Date  . CYSTOSCOPY W/ URETERAL STENT PLACEMENT Right 12/13/2017   Procedure: CYSTOSCOPY WITH RIGHT RETROGRADE PYELOGRAM/URETERAL RIGHT STENT PLACEMENT;  Surgeon: [name], MD;  Location: [facility];  Service: Urology;  Laterality: Right;  . IR IMAGING GUIDED PORT INSERTION  02/23/2018  . TRANSURETHRAL RESECTION OF BLADDER TUMOR N/A 12/13/2017   Procedure: TRANSURETHRAL RESECTION OF BLADDER TUMOR (TURBT);  Surgeon: [name], MD;  Location: [facility];  Service: Urology;  Laterality: N/A;  . TRANSURETHRAL RESECTION OF BLADDER TUMOR WITH GYRUS (TURBT-GYRUS)     [name] 12-13-17  . tubes tied    . uterine ablation     about 2005    Family History  Problem Relation Age of Onset  . Hypertension Mother   . Diabetes Father   . Hypertension Brother   . HIV Brother     No Known Allergies  Current Outpatient Medications on File Prior to Visit  Medication Sig Dispense Refill  . acetaminophen (TYLENOL) 500 MG tablet Take 1,000 mg by mouth every 8 (eight) hours as needed for moderate pain.    Marland Kitchen amLODipine (NORVASC) 10 MG tablet Take 5 mg by mouth daily.    . budesonide-formoterol (SYMBICORT) 160-4.5 MCG/ACT inhaler Inhale 2 puffs into the lungs 2 (two) times daily. 2 Inhaler 0  . docusate sodium (COLACE) 100 MG capsule Take 1 capsule (100 mg total) by mouth 2 (two) times daily. 10 capsule 0  . Doxepin HCl (SILENOR) 3 MG TABS Take 1 tablet (3 mg total) by mouth at bedtime. 30 tablet 2  . Ipratropium-Albuterol (COMBIVENT) 20-100 MCG/ACT AERS respimat Inhale 1 puff into the lungs every 6 (six) hours. 2 Inhaler 3  . lactulose (CHRONULAC) 10 GM/15ML solution Take 15 mLs (10 g total) by mouth 2 (two) times daily as needed for mild constipation or moderate constipation. 473 mL 1  . lidocaine-prilocaine (EMLA) cream Apply to affected area once 30 g 3  . LORazepam (ATIVAN) 0.5 MG tablet Take 1 tablet (0.5 mg total) by mouth every 6 (six) hours as needed for anxiety (Nausea or vomiting). 30 tablet 0  . MARINOL 2.5 MG capsule TAKE 1 CAPSULE BY MOUTH TWICE DAILY BEFORE A MEAL 60 capsule 0  . megestrol (MEGACE ES) 625 MG/5ML suspension Take 5 mLs (625 mg total) by mouth daily. 150 mL 3  . nitrofurantoin, macrocrystal-monohydrate, (MACROBID) 100 MG capsule Take 1 capsule (100 mg total) by mouth 2 (two) times daily. 60 capsule 2  . ondansetron (ZOFRAN) 8 MG tablet Take 1 tablet (8 mg total) by mouth 2 (two) times daily as needed.  Start on the third day after chemotherapy. 30 tablet 1  . oxandrolone (OXANDRIN) 10 MG tablet Take 1 tablet (10 mg total) by mouth daily. 30 tablet 3  . oxyCODONE (OXY IR/ROXICODONE) 5 MG immediate release tablet Take 1  tablet (5 mg total) by mouth every 6 (six) hours as needed for severe pain. 60 tablet 0  . prochlorperazine (COMPAZINE) 10 MG tablet Take 1 tablet (10 mg total) by mouth every 6 (six) hours as needed (Nausea or vomiting). 30 tablet 1  . sulfamethoxazole-trimethoprim (BACTRIM DS,SEPTRA DS) 800-160 MG tablet TAKE 1 TABLET BY MOUTH TWICE DAILY 14 tablet 0   No current facility-administered medications on file prior to visit.     BP 100/64 (BP Location: Right Arm, Patient Position: Sitting, Cuff Size: Small)   Pulse (!) 106   Temp 97.8 F (36.6 C) (Oral)   Resp 16   Ht 5\' 4"  (1.626 m)   Wt 73 lb 9.6 oz (33.4 kg)   SpO2 97%   BMI 12.63 kg/m       Objective:   Physical Exam  Constitutional:  Cachectic appearing AA female  Cardiovascular: Normal rate, regular rhythm and normal heart sounds.  No murmur heard. Pulmonary/Chest: Effort normal and breath sounds normal. No respiratory distress. She has no wheezes.  Neurological:  Reflex Scores:      Bicep reflexes are 3+ on the right side and 3+ on the left side.      Brachioradialis reflexes are 3+ on the right side and 3+ on the left side.      Patellar reflexes are 3+ on the right side and 3+ on the left side. Psychiatric: She has a normal mood and affect. Her behavior is normal. Judgment and thought content normal.  neuro: bilateral UE strength is 5/5,  Slightly diminished left hand grasp, + facial symmetry, bilateral LE strength is 5/5.         Assessment & Plan:  Bladder cancer- management per oncology/urology. Weight is up 2 pounds. Encouraged high calorie healthy snacks.  Vit D deficiency- check follow up level.  COPD- stable, management per pulmonology.  HTN- bp a bit low. Advised pt to cut amlodipine in  half and take 1/2 tab once daily.   Left hand weakness- CT head is performed.  Neg for CVA. Refer to had specialist.

## 2018-05-18 NOTE — Patient Instructions (Signed)
Cut amlodipine in half and take 1/2 tab once daily. Check blood pressure once daily and call us with your readings in a few days. Complete CT scan on the first floor. Begin chantix- good luck quitting smoking.

## 2018-05-20 ENCOUNTER — Telehealth: Payer: Self-pay | Admitting: Family

## 2018-05-20 DIAGNOSIS — R29818 Other symptoms and signs involving the nervous system: Secondary | ICD-10-CM

## 2018-05-20 NOTE — Telephone Encounter (Signed)
Please let pt know that in addition to referral to hand specialist we should also image her neck to make sure that there is not a pinched nerve or neck issue contributing to her hand pain. I am placing order for MRI.

## 2018-05-21 NOTE — Telephone Encounter (Signed)
Notified pt and she is agreeable. Was already contact and has appt for Saturday at 12:30pm for MRI.

## 2018-05-22 DIAGNOSIS — C678 Malignant neoplasm of overlapping sites of bladder: Secondary | ICD-10-CM | POA: Diagnosis not present

## 2018-05-22 DIAGNOSIS — J439 Emphysema, unspecified: Secondary | ICD-10-CM | POA: Diagnosis not present

## 2018-05-22 DIAGNOSIS — F17200 Nicotine dependence, unspecified, uncomplicated: Secondary | ICD-10-CM | POA: Diagnosis not present

## 2018-05-23 ENCOUNTER — Other Ambulatory Visit: Payer: Self-pay | Admitting: Family

## 2018-05-23 DIAGNOSIS — E559 Vitamin D deficiency, unspecified: Secondary | ICD-10-CM

## 2018-05-26 ENCOUNTER — Ambulatory Visit (HOSPITAL_BASED_OUTPATIENT_CLINIC_OR_DEPARTMENT_OTHER)
Admission: RE | Admit: 2018-05-26 | Discharge: 2018-05-26 | Disposition: A | Discharge status: Home / Self Care | Payer: Medicare Other | Source: Ambulatory Visit | Attending: Family | Admitting: Family

## 2018-05-26 DIAGNOSIS — R29818 Other symptoms and signs involving the nervous system: Secondary | ICD-10-CM | POA: Insufficient documentation

## 2018-05-26 DIAGNOSIS — M47812 Spondylosis without myelopathy or radiculopathy, cervical region: Secondary | ICD-10-CM | POA: Insufficient documentation

## 2018-05-26 DIAGNOSIS — M5030 Other cervical disc degeneration, unspecified cervical region: Secondary | ICD-10-CM | POA: Diagnosis not present

## 2018-05-26 DIAGNOSIS — M4802 Spinal stenosis, cervical region: Secondary | ICD-10-CM | POA: Insufficient documentation

## 2018-05-27 ENCOUNTER — Telehealth: Payer: Self-pay | Admitting: Family

## 2018-05-27 DIAGNOSIS — M5030 Other cervical disc degeneration, unspecified cervical region: Secondary | ICD-10-CM

## 2018-05-27 NOTE — Telephone Encounter (Signed)
Please let pt know that I reviewed her MRI results.  MRI shows degenerative changes in her c-spine.  I would like for her to see neurosurgery for further evaluation.

## 2018-05-28 NOTE — Telephone Encounter (Signed)
Notified pt and she voices understanding and is agreeable to proceed with referral.

## 2018-05-29 ENCOUNTER — Encounter (HOSPITAL_COMMUNITY): Payer: Medicare Other

## 2018-06-05 ENCOUNTER — Ambulatory Visit (HOSPITAL_COMMUNITY): Payer: Medicare Other

## 2018-06-14 ENCOUNTER — Other Ambulatory Visit: Payer: Medicare Other

## 2018-06-14 ENCOUNTER — Ambulatory Visit: Payer: Medicare Other

## 2018-06-14 ENCOUNTER — Ambulatory Visit: Payer: Medicare Other | Admitting: Hematology & Oncology

## 2018-06-15 ENCOUNTER — Ambulatory Visit (HOSPITAL_COMMUNITY): Admission: RE | Admit: 2018-06-15 | Payer: Medicare Other | Source: Ambulatory Visit

## 2018-06-19 ENCOUNTER — Inpatient Hospital Stay: Payer: Medicare Other | Admitting: Hematology & Oncology

## 2018-06-19 ENCOUNTER — Inpatient Hospital Stay: Payer: Medicare Other

## 2018-06-19 ENCOUNTER — Inpatient Hospital Stay: Payer: Medicare Other | Attending: Hematology & Oncology

## 2018-06-29 ENCOUNTER — Ambulatory Visit (INDEPENDENT_AMBULATORY_CARE_PROVIDER_SITE_OTHER): Payer: Medicare Other | Admitting: Family

## 2018-06-29 ENCOUNTER — Other Ambulatory Visit: Payer: Self-pay | Admitting: *Deleted

## 2018-06-29 ENCOUNTER — Encounter: Payer: Self-pay | Admitting: Family

## 2018-06-29 VITALS — BP 146/102 | HR 111 | Temp 98.5°F | Resp 18 | Ht 64.0 in | Wt 78.8 lb

## 2018-06-29 DIAGNOSIS — C679 Malignant neoplasm of bladder, unspecified: Secondary | ICD-10-CM | POA: Diagnosis not present

## 2018-06-29 DIAGNOSIS — R310 Gross hematuria: Secondary | ICD-10-CM

## 2018-06-29 DIAGNOSIS — I10 Essential (primary) hypertension: Secondary | ICD-10-CM | POA: Diagnosis not present

## 2018-06-29 DIAGNOSIS — C672 Malignant neoplasm of lateral wall of bladder: Secondary | ICD-10-CM

## 2018-06-29 DIAGNOSIS — J45909 Unspecified asthma, uncomplicated: Secondary | ICD-10-CM | POA: Diagnosis not present

## 2018-06-29 LAB — CBC WITH DIFFERENTIAL/PLATELET
Basophils Absolute: 0 10*3/uL (ref 0.0–0.1)
Basophils Relative: 0.7 % (ref 0.0–3.0)
Eosinophils Absolute: 0.1 10*3/uL (ref 0.0–0.7)
Eosinophils Relative: 1.2 % (ref 0.0–5.0)
HCT: 37.6 % (ref 36.0–46.0)
Hemoglobin: 12.5 g/dL (ref 12.0–15.0)
Lymphocytes Relative: 24.7 % (ref 12.0–46.0)
Lymphs Abs: 1.4 10*3/uL (ref 0.7–4.0)
MCHC: 33.2 g/dL (ref 30.0–36.0)
MCV: 102 fl — ABNORMAL HIGH (ref 78.0–100.0)
Monocytes Absolute: 0.6 10*3/uL (ref 0.1–1.0)
Monocytes Relative: 11.4 % (ref 3.0–12.0)
Neutro Abs: 3.4 10*3/uL (ref 1.4–7.7)
Neutrophils Relative %: 62 % (ref 43.0–77.0)
Platelets: 215 10*3/uL (ref 150.0–400.0)
RBC: 3.68 Mil/uL — ABNORMAL LOW (ref 3.87–5.11)
RDW: 13.4 % (ref 11.5–15.5)
WBC: 5.5 10*3/uL (ref 4.0–10.5)

## 2018-06-29 LAB — BASIC METABOLIC PANEL
BUN: 10 mg/dL (ref 6–23)
CO2: 32 mEq/L (ref 19–32)
Calcium: 9.7 mg/dL (ref 8.4–10.5)
Chloride: 103 mEq/L (ref 96–112)
Creatinine, Ser: 0.88 mg/dL (ref 0.40–1.20)
GFR: 83.62 mL/min (ref >60.00)
Glucose, Bld: 89 mg/dL (ref 70–99)
Potassium: 4.2 mEq/L (ref 3.5–5.1)
Sodium: 143 mEq/L (ref 135–145)

## 2018-06-29 LAB — POCT HEMOGLOBIN: Hemoglobin: 11.4 g/dL (ref 9.5–13.5)

## 2018-06-29 MED ORDER — LORAZEPAM 0.5 MG PO TABS: 0.5000 mg | ORAL_TABLET | Freq: Four times a day (QID) | ORAL | 0 refills | Status: DC | PRN

## 2018-06-29 MED ORDER — OXYCODONE HCL 5 MG PO TABS: 5.0000 mg | ORAL_TABLET | Freq: Four times a day (QID) | ORAL | 0 refills | Status: DC | PRN

## 2018-06-29 MED ORDER — IPRATROPIUM-ALBUTEROL 20-100 MCG/ACT IN AERS: 1.0000 | INHALATION_SPRAY | Freq: Four times a day (QID) | RESPIRATORY_TRACT | 3 refills | Status: DC

## 2018-06-29 MED ORDER — CEPHALEXIN 500 MG PO CAPS: 500.0000 mg | ORAL_CAPSULE | Freq: Three times a day (TID) | ORAL | 0 refills | Status: DC

## 2018-06-29 MED ORDER — BUDESONIDE-FORMOTEROL FUMARATE 160-4.5 MCG/ACT IN AERO: 2.0000 | INHALATION_SPRAY | Freq: Two times a day (BID) | RESPIRATORY_TRACT | 3 refills | Status: DC

## 2018-06-29 NOTE — Progress Notes (Signed)
Subjective:    Patient ID: Donna Curry, female    DOB: 14-Aug-1955, 62 y.o.   MRN: [id number]  HPI  Patient is a 62 yr old female who presents today for follow up.  HTN- she is maintained on amlodipine 10mg , ran out of medication. BP Readings from Last 3 Encounters:  06/29/18 (!) 146/102  05/18/18 100/64  04/26/18 101/74   Bladder CA-reports recurrent hematuria- since Friday.  She is scheduled for a pet scan next week.  She has an appointment with [name] on 12/24.  She also follows with [facility]- [name].  Asthma- Reports symptoms stable.  Review of Systems Past Medical History:  Diagnosis Date  . Anemia   . Family history of adverse reaction to anesthesia    brother wakes up and dont know who he is and sister has n/v  . Goals of care, counseling/discussion 02/09/2018  . History of kidney stones   . Hypertension   . Iron deficiency anemia due to chronic blood loss 03/01/2018  . Measles as a child  . Mumps as a child  . TIA (transient ischemic attack)   . Tobacco abuse      Social History   Socioeconomic History  . Marital status: Single    Spouse name: Not on file  . Number of children: Not on file  . Years of education: Not on file  . Highest education level: Not on file  Occupational History  . Not on file  Social Needs  . Financial resource strain: Not on file  . Food insecurity:    Worry: Not on file    Inability: Not on file  . Transportation needs:    Medical: Not on file    Non-medical: Not on file  Tobacco Use  . Smoking status: Former Smoker    Packs/day: 0.50    Types: Cigarettes    Last attempt to quit: 06/15/2018    Years since quitting: 0.0  . Smokeless tobacco: Never Used  . Tobacco comment: 1/4 pack per day,Hasnt smoked in 8 days  Substance and Sexual Activity  . Alcohol use: No    Alcohol/week: 0.0 standard drinks  . Drug use: Yes    Comment: marijuana denies at preop  . Sexual activity: Not Currently  Lifestyle  .  Physical activity:    Days per week: Not on file    Minutes per session: Not on file  . Stress: Not on file  Relationships  . Social connections:    Talks on phone: Not on file    Gets together: Not on file    Attends religious service: Not on file    Active member of club or organization: Not on file    Attends meetings of clubs or organizations: Not on file    Relationship status: Not on file  . Intimate partner violence:    Fear of current or ex partner: Not on file    Emotionally abused: Not on file    Physically abused: Not on file    Forced sexual activity: Not on file  Other Topics Concern  . Not on file  Social History Narrative   Denies hx of drug use   Single   1 daughter age 26 lives with daughter and grandson who is 32.   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    Past Surgical History:  Procedure Laterality Date  . CYSTOSCOPY W/ URETERAL STENT PLACEMENT Right 12/13/2017  Procedure: CYSTOSCOPY WITH RIGHT RETROGRADE PYELOGRAM/URETERAL RIGHT STENT PLACEMENT;  Surgeon: [name], MD;  Location: [facility];  Service: Urology;  Laterality: Right;  . IR IMAGING GUIDED PORT INSERTION  02/23/2018  . TRANSURETHRAL RESECTION OF BLADDER TUMOR N/A 12/13/2017   Procedure: TRANSURETHRAL RESECTION OF BLADDER TUMOR (TURBT);  Surgeon: [name], MD;  Location: [facility];  Service: Urology;  Laterality: N/A;  . TRANSURETHRAL RESECTION OF BLADDER TUMOR WITH GYRUS (TURBT-GYRUS)     [name] 12-13-17  . tubes tied    . uterine ablation     about 2005    Family History  Problem Relation Age of Onset  . Hypertension Mother   . Diabetes Father   . Hypertension Brother   . HIV Brother     No Known Allergies  Current Outpatient Medications on File Prior to Visit  Medication Sig Dispense Refill  . acetaminophen (TYLENOL) 500 MG tablet Take 1,000 mg by mouth every 8 (eight) hours as needed for moderate pain.    Marland Kitchen amLODipine (NORVASC) 10 MG  tablet Take 5 mg by mouth daily.    . budesonide-formoterol (SYMBICORT) 160-4.5 MCG/ACT inhaler Inhale 2 puffs into the lungs 2 (two) times daily. 2 Inhaler 0  . docusate sodium (COLACE) 100 MG capsule Take 1 capsule (100 mg total) by mouth 2 (two) times daily. 10 capsule 0  . Doxepin HCl (SILENOR) 3 MG TABS Take 1 tablet (3 mg total) by mouth at bedtime. 30 tablet 2  . Ipratropium-Albuterol (COMBIVENT) 20-100 MCG/ACT AERS respimat Inhale 1 puff into the lungs every 6 (six) hours. 2 Inhaler 3  . lactulose (CHRONULAC) 10 GM/15ML solution Take 15 mLs (10 g total) by mouth 2 (two) times daily as needed for mild constipation or moderate constipation. 473 mL 1  . lidocaine-prilocaine (EMLA) cream Apply to affected area once 30 g 3  . LORazepam (ATIVAN) 0.5 MG tablet Take 1 tablet (0.5 mg total) by mouth every 6 (six) hours as needed for anxiety (Nausea or vomiting). 30 tablet 0  . MARINOL 2.5 MG capsule TAKE 1 CAPSULE BY MOUTH TWICE DAILY BEFORE A MEAL 60 capsule 0  . megestrol (MEGACE ES) 625 MG/5ML suspension Take 5 mLs (625 mg total) by mouth daily. 150 mL 3  . nitrofurantoin, macrocrystal-monohydrate, (MACROBID) 100 MG capsule Take 1 capsule (100 mg total) by mouth 2 (two) times daily. 60 capsule 2  . ondansetron (ZOFRAN) 8 MG tablet Take 1 tablet (8 mg total) by mouth 2 (two) times daily as needed. Start on the third day after chemotherapy. 30 tablet 1  . oxandrolone (OXANDRIN) 10 MG tablet Take 1 tablet (10 mg total) by mouth daily. 30 tablet 3  . oxyCODONE (OXY IR/ROXICODONE) 5 MG immediate release tablet Take 1 tablet (5 mg total) by mouth every 6 (six) hours as needed for severe pain. 60 tablet 0  . prochlorperazine (COMPAZINE) 10 MG tablet Take 1 tablet (10 mg total) by mouth every 6 (six) hours as needed (Nausea or vomiting). 30 tablet 1  . sulfamethoxazole-trimethoprim (BACTRIM DS,SEPTRA DS) 800-160 MG tablet TAKE 1 TABLET BY MOUTH TWICE DAILY 14 tablet 0  . varenicline (CHANTIX PAK) 0.5 MG X  11 & 1 MG X 42 tablet Take one 0.5 mg tablet daily x 3 days, then increase to one 0.5 mg tab twice daily x 4 days, then increase to one 1 mg tablet twice daily. 53 tablet 0   No current facility-administered medications on file prior to visit.  BP (!) 146/102 (BP Location: Right Arm, Cuff Size: Normal)   Pulse (!) 111   Temp 98.5 F (36.9 C) (Oral)   Resp 18   Ht 5\' 4"  (1.626 m)   Wt 78 lb 12.8 oz (35.7 kg)   SpO2 98%   BMI 13.53 kg/m       Objective:   Physical Exam  Constitutional: She is oriented to person, place, and time. She appears well-developed and well-nourished.  Neck: Neck supple. No thyromegaly present.  Cardiovascular: Normal rate, regular rhythm and normal heart sounds.  No murmur heard. Pulmonary/Chest: Effort normal and breath sounds normal. No respiratory distress. She has no wheezes.  Musculoskeletal: She exhibits no edema.  Neurological: She is alert and oriented to person, place, and time.  Skin: Skin is warm and dry.  Psychiatric: She has a normal mood and affect. Her behavior is normal. Judgment and thought content normal.          Assessment & Plan:  HTN-  bp elevated. Restart amlodipine.   Bladder Cancer- having gross hematuria.  CBC is stable.  Will try to arrange follow up with urology early next week. She is advised as follows:   Curry to the ER if you develop difficulty urinating, fever or if increased blood in urine, increased weakness or shortness of breath.  Will send urine for culture and will plan empiric rx with keflex for UTI.   Asthma- stable on current meds. Continue same.

## 2018-06-29 NOTE — Patient Instructions (Addendum)
Please restart the who pill of the amlodipine.   Start Keflex twice daily for possible urinary tract infeciton. Complete lab work prior to leaving. Go to the ER if you develop difficulty urinating, fever or if increased blood in urine, increased weakness or shortness of breath.

## 2018-06-30 LAB — URINE CULTURE
MICRO NUMBER:: 91463872
Result:: NO GROWTH
SPECIMEN QUALITY:: ADEQUATE

## 2018-07-01 ENCOUNTER — Telehealth: Payer: Self-pay | Admitting: Family

## 2018-07-01 NOTE — Telephone Encounter (Signed)
Could you please call [facility] and arrange follow up early this week with Urology due to hematuria?

## 2018-07-02 NOTE — Telephone Encounter (Signed)
Patient has been scheduled to see Donna Fallen NP at [name] urology tomorrow at 9:30 am. Patient advised of appointment date and time.

## 2018-07-05 ENCOUNTER — Ambulatory Visit (HOSPITAL_COMMUNITY)
Admission: RE | Admit: 2018-07-05 | Discharge: 2018-07-05 | Disposition: A | Discharge status: Home / Self Care | Payer: Medicare Other | Source: Ambulatory Visit | Attending: Hematology & Oncology | Admitting: Hematology & Oncology

## 2018-07-05 DIAGNOSIS — C672 Malignant neoplasm of lateral wall of bladder: Secondary | ICD-10-CM | POA: Insufficient documentation

## 2018-07-05 DIAGNOSIS — C679 Malignant neoplasm of bladder, unspecified: Secondary | ICD-10-CM | POA: Diagnosis not present

## 2018-07-05 DIAGNOSIS — C670 Malignant neoplasm of trigone of bladder: Secondary | ICD-10-CM | POA: Diagnosis not present

## 2018-07-05 LAB — GLUCOSE, CAPILLARY: Glucose-Capillary: 99 mg/dL (ref 70–99)

## 2018-07-05 MED ORDER — FLUDEOXYGLUCOSE F - 18 (FDG) INJECTION
5.3000 | Freq: Once | INTRAVENOUS | Status: AC | PRN
  Administered 2018-07-05: 5.3 via INTRAVENOUS

## 2018-07-09 ENCOUNTER — Telehealth: Payer: Self-pay | Admitting: *Deleted

## 2018-07-09 DIAGNOSIS — C678 Malignant neoplasm of overlapping sites of bladder: Secondary | ICD-10-CM | POA: Diagnosis not present

## 2018-07-09 DIAGNOSIS — N390 Urinary tract infection, site not specified: Secondary | ICD-10-CM | POA: Diagnosis not present

## 2018-07-09 DIAGNOSIS — N130 Hydronephrosis with ureteropelvic junction obstruction: Secondary | ICD-10-CM | POA: Diagnosis not present

## 2018-07-09 NOTE — Telephone Encounter (Signed)
Unable to reach pt on cell phone, spoke with pt.'s sister BJ at patient's home phone number and notified her per order of [name] that scan shows no obvious cancer, but there is bladder wall thickening that could represent residual cancer in the bladder and to go back to urologist for a cystoscopy.  Pt.'s sister states that pt is at her Urologist at this time d/t hematuria.  Instructed pt.'s sister to call this office back if after urology appt today, pt needs to be seen prior to 07/17/18.  Pt.'s sister appreciative of call.

## 2018-07-09 NOTE — Telephone Encounter (Signed)
-----   Message from [name], MD sent at 07/06/2018  7:51 AM EST ----- Call - No obvious cancer seen on the scan -- there is bladder wall thickening that could represent residual cancer in the bladder.  Need to go back to the urologist for a cystoscopy.  pete

## 2018-07-17 ENCOUNTER — Other Ambulatory Visit: Payer: Self-pay

## 2018-07-17 ENCOUNTER — Inpatient Hospital Stay: Payer: Medicare Other

## 2018-07-17 ENCOUNTER — Ambulatory Visit: Payer: Medicare Other | Admitting: Hematology & Oncology

## 2018-07-17 ENCOUNTER — Encounter: Payer: Self-pay | Admitting: Family

## 2018-07-17 ENCOUNTER — Inpatient Hospital Stay (HOSPITAL_BASED_OUTPATIENT_CLINIC_OR_DEPARTMENT_OTHER): Payer: Medicare Other | Admitting: Family

## 2018-07-17 ENCOUNTER — Other Ambulatory Visit: Payer: Medicare Other

## 2018-07-17 ENCOUNTER — Ambulatory Visit: Payer: Medicare Other

## 2018-07-17 ENCOUNTER — Inpatient Hospital Stay: Payer: Medicare Other | Attending: Hematology & Oncology

## 2018-07-17 DIAGNOSIS — C670 Malignant neoplasm of trigone of bladder: Secondary | ICD-10-CM

## 2018-07-17 DIAGNOSIS — C679 Malignant neoplasm of bladder, unspecified: Secondary | ICD-10-CM | POA: Diagnosis present

## 2018-07-17 DIAGNOSIS — Z95828 Presence of other vascular implants and grafts: Secondary | ICD-10-CM

## 2018-07-17 DIAGNOSIS — D500 Iron deficiency anemia secondary to blood loss (chronic): Secondary | ICD-10-CM | POA: Insufficient documentation

## 2018-07-17 DIAGNOSIS — E559 Vitamin D deficiency, unspecified: Secondary | ICD-10-CM

## 2018-07-17 LAB — CMP (CANCER CENTER ONLY)
ALT: 7 U/L (ref 0–44)
AST: 12 U/L — ABNORMAL LOW (ref 15–41)
Albumin: 4.2 g/dL (ref 3.5–5.0)
Alkaline Phosphatase: 90 U/L (ref 38–126)
Anion gap: 9 (ref 5–15)
BUN: 11 mg/dL (ref 8–23)
CO2: 29 mmol/L (ref 22–32)
Calcium: 9.4 mg/dL (ref 8.9–10.3)
Chloride: 102 mmol/L (ref 98–111)
Creatinine: 0.95 mg/dL (ref 0.44–1.00)
GFR, Est AFR Am: >60 mL/min (ref >60)
GFR, Estimated: >60 mL/min (ref >60)
Glucose, Bld: 109 mg/dL — ABNORMAL HIGH (ref 70–99)
Potassium: 3.4 mmol/L — ABNORMAL LOW (ref 3.5–5.1)
Sodium: 140 mmol/L (ref 135–145)
Total Bilirubin: 0.3 mg/dL (ref 0.3–1.2)
Total Protein: 6.6 g/dL (ref 6.5–8.1)

## 2018-07-17 LAB — CBC WITH DIFFERENTIAL (CANCER CENTER ONLY)
Abs Immature Granulocytes: 0.01 10*3/uL (ref 0.00–0.07)
Basophils Absolute: 0 10*3/uL (ref 0.0–0.1)
Basophils Relative: 0 %
Eosinophils Absolute: 0.1 10*3/uL (ref 0.0–0.5)
Eosinophils Relative: 1 %
HCT: 36.2 % (ref 36.0–46.0)
Hemoglobin: 11.8 g/dL — ABNORMAL LOW (ref 12.0–15.0)
Immature Granulocytes: 0 %
Lymphocytes Relative: 21 %
Lymphs Abs: 1 10*3/uL (ref 0.7–4.0)
MCH: 33 pg (ref 26.0–34.0)
MCHC: 32.6 g/dL (ref 30.0–36.0)
MCV: 101.1 fL — ABNORMAL HIGH (ref 80.0–100.0)
Monocytes Absolute: 0.5 10*3/uL (ref 0.1–1.0)
Monocytes Relative: 10 %
Neutro Abs: 3.3 10*3/uL (ref 1.7–7.7)
Neutrophils Relative %: 68 %
Platelet Count: 196 10*3/uL (ref 150–400)
RBC: 3.58 MIL/uL — ABNORMAL LOW (ref 3.87–5.11)
RDW: 12.2 % (ref 11.5–15.5)
WBC Count: 4.9 10*3/uL (ref 4.0–10.5)
nRBC: 0 % (ref 0.0–0.2)

## 2018-07-17 LAB — IRON AND TIBC
Iron: 49 ug/dL (ref 41–142)
Saturation Ratios: 17 % — ABNORMAL LOW (ref 21–57)
TIBC: 280 ug/dL (ref 236–444)
UIBC: 231 ug/dL (ref 120–384)

## 2018-07-17 LAB — FERRITIN: Ferritin: 152 ng/mL (ref 11–307)

## 2018-07-17 MED ORDER — SODIUM CHLORIDE 0.9% FLUSH
10.0000 mL | Freq: Once | INTRAVENOUS | Status: AC
  Administered 2018-07-17: 10 mL via INTRAVENOUS
  Filled 2018-07-17: qty 10

## 2018-07-17 MED ORDER — HEPARIN SOD (PORK) LOCK FLUSH 100 UNIT/ML IV SOLN
500.0000 [IU] | Freq: Once | INTRAVENOUS | Status: AC
  Administered 2018-07-17: 500 [IU] via INTRAVENOUS
  Filled 2018-07-17: qty 5

## 2018-07-17 NOTE — Patient Instructions (Signed)
Implanted Port Insertion, Care After  This sheet gives you information about how to care for yourself after your procedure. Your health care provider may also give you more specific instructions. If you have problems or questions, contact your health care provider.  What can I expect after the procedure?  After the procedure, it is common to have:  · Discomfort at the port insertion site.  · Bruising on the skin over the port. This should improve over 3-4 days.  Follow these instructions at home:  Port care  · After your port is placed, you will get a manufacturer's information card. The card has information about your port. Keep this card with you at all times.  · Take care of the port as told by your health care provider. Ask your health care provider if you or a family member can get training for taking care of the port at home. A home health care nurse may also take care of the port.  · Make sure to remember what type of port you have.  Incision care         · Follow instructions from your health care provider about how to take care of your port insertion site. Make sure you:  ? Wash your hands with soap and water before and after you change your bandage (dressing). If soap and water are not available, use hand sanitizer.  ? Change your dressing as told by your health care provider.  ? Leave stitches (sutures), skin glue, or adhesive strips in place. These skin closures may need to stay in place for 2 weeks or longer. If adhesive strip edges start to loosen and curl up, you may trim the loose edges. Do not remove adhesive strips completely unless your health care provider tells you to do that.  · Check your port insertion site every day for signs of infection. Check for:  ? Redness, swelling, or pain.  ? Fluid or blood.  ? Warmth.  ? Pus or a bad smell.  Activity  · Return to your normal activities as told by your health care provider. Ask your health care provider what activities are safe for you.  · Do not  lift anything that is heavier than 10 lb (4.5 kg), or the limit that you are told, until your health care provider says that it is safe.  General instructions  · Take over-the-counter and prescription medicines only as told by your health care provider.  · Do not take baths, swim, or use a hot tub until your health care provider approves. Ask your health care provider if you may take showers. You may only be allowed to take sponge baths.  · Do not drive for 24 hours if you were given a sedative during your procedure.  · Wear a medical alert bracelet in case of an emergency. This will tell any health care providers that you have a port.  · Keep all follow-up visits as told by your health care provider. This is important.  Contact a health care provider if:  · You cannot flush your port with saline as directed, or you cannot draw blood from the port.  · You have a fever or chills.  · You have redness, swelling, or pain around your port insertion site.  · You have fluid or blood coming from your port insertion site.  · Your port insertion site feels warm to the touch.  · You have pus or a bad smell coming from the port   insertion site.  Get help right away if:  · You have chest pain or shortness of breath.  · You have bleeding from your port that you cannot control.  Summary  · Take care of the port as told by your health care provider. Keep the manufacturer's information card with you at all times.  · Change your dressing as told by your health care provider.  · Contact a health care provider if you have a fever or chills or if you have redness, swelling, or pain around your port insertion site.  · Keep all follow-up visits as told by your health care provider.  This information is not intended to replace advice given to you by your health care provider. Make sure you discuss any questions you have with your health care provider.  Document Released: 05/01/2013 Document Revised: 02/06/2018 Document Reviewed:  02/06/2018  Elsevier Interactive Patient Education © 2019 [employer]

## 2018-07-17 NOTE — Progress Notes (Signed)
Hematology and Oncology Follow Up Visit  Donna Curry [id number] 1955-09-30 62 y.o. 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 She states that she is doing well and has no complaints at this time.  She has noted a small amount of blood in her urine. She denies pain or burning on urination.  She saw her urologist [name] with [facility] on 12/16 and states that she has a follow-up with cystoscopy on January 21st.  Her PET scan earlier this month it showed abnormal wall thickening involving the right anterolateral bladder with corresponding increased uptake suspicious for residual tumor. No fever, chills, n/v, cough, rash, dizziness, chest pain, palpitations, abdominal pain or changes in bowel or bladder habits.  She has SOB with asthma and uses her inhaler as needed. No swelling or tenderness in her extremities. She has tingling in her feet that comes and goes.  No lymphadenopathy noted on exam.  She has maintained a good appetite and is staying well hydrated. Her weight is up 3 lbs since her last visit.   ECOG Performance Status: 1 - Symptomatic but completely ambulatory  Medications:  Allergies as of 07/17/2018   No Known Allergies     Medication List       Accurate as of July 17, 2018 10:05 AM. Always use your most recent med list.        acetaminophen 500 MG tablet Commonly known as:  TYLENOL Take 1,000 mg by mouth every 8 (eight) hours as needed for moderate pain.   amLODipine 10 MG tablet Commonly known as:  NORVASC Take 5 mg by mouth daily.   budesonide-formoterol 160-4.5 MCG/ACT inhaler Commonly known as:  SYMBICORT Inhale 2 puffs into the lungs 2 (two) times daily.   cephALEXin 500 MG  capsule Commonly known as:  KEFLEX Take 1 capsule (500 mg total) by mouth 3 (three) times daily.   docusate sodium 100 MG capsule Commonly known as:  COLACE Take 1 capsule (100 mg total) by mouth 2 (two) times daily.   Doxepin HCl 3 MG Tabs Commonly known as:  SILENOR Take 1 tablet (3 mg total) by mouth at bedtime.   Ipratropium-Albuterol 20-100 MCG/ACT Aers respimat Commonly known as:  COMBIVENT Inhale 1 puff into the lungs every 6 (six) hours.   lactulose 10 GM/15ML solution Commonly known as:  CHRONULAC Take 15 mLs (10 g total) by mouth 2 (two) times daily as needed for mild constipation or moderate constipation.   lidocaine-prilocaine cream Commonly known as:  EMLA Apply to affected area once   LORazepam 0.5 MG tablet Commonly known as:  ATIVAN Take 1 tablet (0.5 mg total) by mouth every 6 (six) hours as needed for anxiety (Nausea or vomiting).   MARINOL 2.5 MG capsule Generic drug:  dronabinol TAKE 1 CAPSULE BY MOUTH TWICE DAILY BEFORE A MEAL   megestrol 625 MG/5ML suspension Commonly known as:  MEGACE ES Take 5 mLs (625 mg total) by mouth daily.   nitrofurantoin (macrocrystal-monohydrate) 100 MG capsule Commonly known as:  MACROBID Take 1 capsule (100 mg total) by mouth 2 (two) times daily.   ondansetron 8 MG tablet Commonly known as:  ZOFRAN Take 1 tablet (8 mg total) by mouth 2 (two) times daily as needed. Start on the third day after  chemotherapy.   oxandrolone 10 MG tablet Commonly known as:  OXANDRIN Take 1 tablet (10 mg total) by mouth daily.   oxyCODONE 5 MG immediate release tablet Commonly known as:  Oxy IR/ROXICODONE Take 1 tablet (5 mg total) by mouth every 6 (six) hours as needed for severe pain.   prochlorperazine 10 MG tablet Commonly known as:  COMPAZINE Take 1 tablet (10 mg total) by mouth every 6 (six) hours as needed (Nausea or vomiting).   sulfamethoxazole-trimethoprim 800-160 MG tablet Commonly known as:  BACTRIM DS,SEPTRA DS TAKE 1  TABLET BY MOUTH TWICE DAILY   varenicline 0.5 MG X 11 & 1 MG X 42 tablet Commonly known as:  CHANTIX PAK Take one 0.5 mg tablet daily x 3 days, then increase to one 0.5 mg tab twice daily x 4 days, then increase to one 1 mg tablet twice daily.       Allergies: No Known Allergies  Past Medical History, Surgical history, Social history, and Family History were reviewed and updated.  Review of Systems: All other 10 point review of systems is negative.   Physical Exam:  vitals were not taken for this visit.   Wt Readings from Last 3 Encounters:  06/29/18 78 lb 12.8 oz (35.7 kg)  05/18/18 73 lb 9.6 oz (33.4 kg)  04/26/18 71 lb 4 oz (32.3 kg)    Ocular: Sclerae unicteric, pupils equal, round and reactive to light Ear-nose-throat: Oropharynx clear, dentition fair Lymphatic: No cervical, supraclavicular or axillary adenopathy Lungs no rales or rhonchi, good excursion bilaterally Heart regular rate and rhythm, no murmur appreciated Abd soft, nontender, positive bowel sounds, no liver or spleen tip palpated on exam, no fluid wave  MSK no focal spinal tenderness, no joint edema Neuro: non-focal, well-oriented, appropriate affect Breasts: Deferred   Lab Results  Component Value Date   WBC 4.9 07/17/2018   HGB 11.8 (L) 07/17/2018   HCT 36.2 07/17/2018   MCV 101.1 (H) 07/17/2018   PLT 196 07/17/2018   Lab Results  Component Value Date   FERRITIN 978 (H) 04/26/2018   IRON 84 04/26/2018   TIBC 271 04/26/2018   UIBC 187 04/26/2018   IRONPCTSAT 31 04/26/2018   Lab Results  Component Value Date   RBC 3.58 (L) 07/17/2018   No results found for: KPAFRELGTCHN, LAMBDASER, KAPLAMBRATIO No results found for: IGGSERUM, IGA, IGMSERUM No results found for: Ronnald Ramp, A1GS, A2GS, Tillman Sers, SPEI   Chemistry      Component Value Date/Time   NA 140 07/17/2018 0902   K 3.4 (L) 07/17/2018 0902   CL 102 07/17/2018 0902   CO2 29 07/17/2018 0902   BUN 11  07/17/2018 0902   CREATININE 0.95 07/17/2018 0902   CREATININE 1.10 08/20/2013 1027      Component Value Date/Time   CALCIUM 9.4 07/17/2018 0902   ALKPHOS 90 07/17/2018 0902   AST 12 (L) 07/17/2018 0902   ALT 7 07/17/2018 0902   BILITOT 0.3 07/17/2018 0902       Impression and Plan: Donna Curry is a very pleasant 62 yo African American female with locally advanced high-grade urothelial carcinoma of the bladder. She appears to be dong well but did have some abnormal wall thickening involving the right anterolateral bladder with corresponding increased uptake suspicious for residual tumor on recent PET scan.  She sees her urologist again on January 21st for cystoscopy. We will see what this shows.  We will see what her iron studies show and bring her  back in for infusion if needed.  We will plan to see her back in another month for MD follow-up.  She will contact our office with any questions or concerns. We can certainly see her sooner if need be.   Laverna Peace, NP 12/24/201910:05 AM

## 2018-07-22 LAB — VITAMIN D 1,25 DIHYDROXY
Vitamin D 1, 25 (OH)2 Total: 62 pg/mL
Vitamin D2 1, 25 (OH)2: 15 pg/mL
Vitamin D3 1, 25 (OH)2: 47 pg/mL

## 2018-08-06 ENCOUNTER — Encounter (HOSPITAL_BASED_OUTPATIENT_CLINIC_OR_DEPARTMENT_OTHER): Payer: Self-pay | Admitting: Emergency Medicine

## 2018-08-06 ENCOUNTER — Other Ambulatory Visit: Payer: Self-pay

## 2018-08-06 ENCOUNTER — Emergency Department (HOSPITAL_BASED_OUTPATIENT_CLINIC_OR_DEPARTMENT_OTHER): Payer: Medicare Other

## 2018-08-06 ENCOUNTER — Inpatient Hospital Stay (HOSPITAL_BASED_OUTPATIENT_CLINIC_OR_DEPARTMENT_OTHER)
Admission: EM | Admit: 2018-08-06 | Discharge: 2018-08-08 | DRG: 193 | Disposition: A | Discharge status: Home / Self Care | Payer: Medicare Other | Attending: Family Medicine | Admitting: Family Medicine

## 2018-08-06 DIAGNOSIS — Y95 Nosocomial condition: Secondary | ICD-10-CM | POA: Diagnosis present

## 2018-08-06 DIAGNOSIS — Z681 Body mass index (BMI) 19 or less, adult: Secondary | ICD-10-CM

## 2018-08-06 DIAGNOSIS — T380X5A Adverse effect of glucocorticoids and synthetic analogues, initial encounter: Secondary | ICD-10-CM | POA: Diagnosis present

## 2018-08-06 DIAGNOSIS — Z7951 Long term (current) use of inhaled steroids: Secondary | ICD-10-CM | POA: Diagnosis not present

## 2018-08-06 DIAGNOSIS — Z9221 Personal history of antineoplastic chemotherapy: Secondary | ICD-10-CM | POA: Diagnosis not present

## 2018-08-06 DIAGNOSIS — C679 Malignant neoplasm of bladder, unspecified: Secondary | ICD-10-CM | POA: Diagnosis present

## 2018-08-06 DIAGNOSIS — I10 Essential (primary) hypertension: Secondary | ICD-10-CM | POA: Diagnosis present

## 2018-08-06 DIAGNOSIS — R0902 Hypoxemia: Secondary | ICD-10-CM

## 2018-08-06 DIAGNOSIS — D638 Anemia in other chronic diseases classified elsewhere: Secondary | ICD-10-CM | POA: Diagnosis present

## 2018-08-06 DIAGNOSIS — Z87442 Personal history of urinary calculi: Secondary | ICD-10-CM

## 2018-08-06 DIAGNOSIS — R61 Generalized hyperhidrosis: Secondary | ICD-10-CM | POA: Diagnosis present

## 2018-08-06 DIAGNOSIS — J439 Emphysema, unspecified: Secondary | ICD-10-CM | POA: Diagnosis not present

## 2018-08-06 DIAGNOSIS — Z8673 Personal history of transient ischemic attack (TIA), and cerebral infarction without residual deficits: Secondary | ICD-10-CM

## 2018-08-06 DIAGNOSIS — C670 Malignant neoplasm of trigone of bladder: Secondary | ICD-10-CM

## 2018-08-06 DIAGNOSIS — E43 Unspecified severe protein-calorie malnutrition: Secondary | ICD-10-CM | POA: Diagnosis present

## 2018-08-06 DIAGNOSIS — J189 Pneumonia, unspecified organism: Secondary | ICD-10-CM | POA: Diagnosis not present

## 2018-08-06 DIAGNOSIS — F17210 Nicotine dependence, cigarettes, uncomplicated: Secondary | ICD-10-CM | POA: Diagnosis present

## 2018-08-06 DIAGNOSIS — F17200 Nicotine dependence, unspecified, uncomplicated: Secondary | ICD-10-CM | POA: Diagnosis present

## 2018-08-06 DIAGNOSIS — Z8249 Family history of ischemic heart disease and other diseases of the circulatory system: Secondary | ICD-10-CM | POA: Diagnosis not present

## 2018-08-06 DIAGNOSIS — R0602 Shortness of breath: Secondary | ICD-10-CM | POA: Diagnosis not present

## 2018-08-06 DIAGNOSIS — E46 Unspecified protein-calorie malnutrition: Secondary | ICD-10-CM | POA: Diagnosis present

## 2018-08-06 DIAGNOSIS — J440 Chronic obstructive pulmonary disease with acute lower respiratory infection: Secondary | ICD-10-CM | POA: Diagnosis present

## 2018-08-06 DIAGNOSIS — Z79899 Other long term (current) drug therapy: Secondary | ICD-10-CM | POA: Diagnosis not present

## 2018-08-06 DIAGNOSIS — J9621 Acute and chronic respiratory failure with hypoxia: Secondary | ICD-10-CM | POA: Diagnosis present

## 2018-08-06 DIAGNOSIS — Z923 Personal history of irradiation: Secondary | ICD-10-CM

## 2018-08-06 DIAGNOSIS — J441 Chronic obstructive pulmonary disease with (acute) exacerbation: Secondary | ICD-10-CM | POA: Diagnosis present

## 2018-08-06 DIAGNOSIS — R5381 Other malaise: Secondary | ICD-10-CM

## 2018-08-06 LAB — I-STAT VENOUS BLOOD GAS, ED
Acid-Base Excess: 7 mmol/L — ABNORMAL HIGH (ref 0.0–2.0)
Bicarbonate: 33.8 mmol/L — ABNORMAL HIGH (ref 20.0–28.0)
O2 Saturation: 83 %
Patient temperature: 98.2
TCO2: 35 mmol/L — ABNORMAL HIGH (ref 22–32)
pCO2, Ven: 54.7 mmHg (ref 44.0–60.0)
pH, Ven: 7.398 (ref 7.250–7.430)
pO2, Ven: 49 mmHg — ABNORMAL HIGH (ref 32.0–45.0)

## 2018-08-06 LAB — COMPREHENSIVE METABOLIC PANEL
ALT: 16 U/L (ref 0–44)
AST: 18 U/L (ref 15–41)
Albumin: 3.5 g/dL (ref 3.5–5.0)
Alkaline Phosphatase: 86 U/L (ref 38–126)
Anion gap: 8 (ref 5–15)
BUN: 9 mg/dL (ref 8–23)
CO2: 32 mmol/L (ref 22–32)
Calcium: 9.2 mg/dL (ref 8.9–10.3)
Chloride: 98 mmol/L (ref 98–111)
Creatinine, Ser: 0.8 mg/dL (ref 0.44–1.00)
GFR calc Af Amer: >60 mL/min (ref >60)
GFR calc non Af Amer: >60 mL/min (ref >60)
Glucose, Bld: 100 mg/dL — ABNORMAL HIGH (ref 70–99)
Potassium: 4 mmol/L (ref 3.5–5.1)
Sodium: 138 mmol/L (ref 135–145)
Total Bilirubin: 0.4 mg/dL (ref 0.3–1.2)
Total Protein: 7.5 g/dL (ref 6.5–8.1)

## 2018-08-06 LAB — URINALYSIS, ROUTINE W REFLEX MICROSCOPIC
Bilirubin Urine: NEGATIVE
Glucose, UA: NEGATIVE mg/dL
Ketones, ur: NEGATIVE mg/dL
Nitrite: NEGATIVE
Protein, ur: 100 mg/dL — AB
Specific Gravity, Urine: 1.01 (ref 1.005–1.030)
pH: 7 (ref 5.0–8.0)

## 2018-08-06 LAB — CBC WITH DIFFERENTIAL/PLATELET
Abs Immature Granulocytes: 0.02 10*3/uL (ref 0.00–0.07)
Basophils Absolute: 0 10*3/uL (ref 0.0–0.1)
Basophils Relative: 0 %
Eosinophils Absolute: 0.1 10*3/uL (ref 0.0–0.5)
Eosinophils Relative: 1 %
HCT: 36.7 % (ref 36.0–46.0)
Hemoglobin: 11.4 g/dL — ABNORMAL LOW (ref 12.0–15.0)
Immature Granulocytes: 0 %
Lymphocytes Relative: 16 %
Lymphs Abs: 0.9 10*3/uL (ref 0.7–4.0)
MCH: 30.9 pg (ref 26.0–34.0)
MCHC: 31.1 g/dL (ref 30.0–36.0)
MCV: 99.5 fL (ref 80.0–100.0)
Monocytes Absolute: 0.4 10*3/uL (ref 0.1–1.0)
Monocytes Relative: 7 %
Neutro Abs: 4.2 10*3/uL (ref 1.7–7.7)
Neutrophils Relative %: 76 %
Platelets: 229 10*3/uL (ref 150–400)
RBC: 3.69 MIL/uL — ABNORMAL LOW (ref 3.87–5.11)
RDW: 13.4 % (ref 11.5–15.5)
WBC: 5.6 10*3/uL (ref 4.0–10.5)
nRBC: 0 % (ref 0.0–0.2)

## 2018-08-06 LAB — URINALYSIS, MICROSCOPIC (REFLEX)

## 2018-08-06 LAB — STREP PNEUMONIAE URINARY ANTIGEN: Strep Pneumo Urinary Antigen: NEGATIVE

## 2018-08-06 LAB — TROPONIN I: Troponin I: 0.03 ng/mL (ref <0.03)

## 2018-08-06 LAB — I-STAT CG4 LACTIC ACID, ED: Lactic Acid, Venous: 0.74 mmol/L (ref 0.5–1.9)

## 2018-08-06 LAB — INFLUENZA PANEL BY PCR (TYPE A & B)
Influenza A By PCR: NEGATIVE
Influenza B By PCR: NEGATIVE

## 2018-08-06 MED ORDER — SODIUM CHLORIDE 0.9 % IV SOLN
1.0000 g | Freq: Three times a day (TID) | INTRAVENOUS | Status: DC
  Filled 2018-08-06: qty 1

## 2018-08-06 MED ORDER — DOCUSATE SODIUM 100 MG PO CAPS
100.0000 mg | ORAL_CAPSULE | Freq: Two times a day (BID) | ORAL | Status: DC
  Administered 2018-08-06 – 2018-08-08 (×4): 100 mg via ORAL
  Filled 2018-08-06 (×4): qty 1

## 2018-08-06 MED ORDER — NICOTINE 14 MG/24HR TD PT24
14.0000 mg | MEDICATED_PATCH | Freq: Every day | TRANSDERMAL | Status: DC
  Administered 2018-08-06 – 2018-08-08 (×3): 14 mg via TRANSDERMAL
  Filled 2018-08-06 (×3): qty 1

## 2018-08-06 MED ORDER — MOMETASONE FURO-FORMOTEROL FUM 200-5 MCG/ACT IN AERO
2.0000 | INHALATION_SPRAY | Freq: Two times a day (BID) | RESPIRATORY_TRACT | Status: DC
  Administered 2018-08-06 – 2018-08-08 (×4): 2 via RESPIRATORY_TRACT
  Filled 2018-08-06: qty 8.8

## 2018-08-06 MED ORDER — SODIUM CHLORIDE 0.9 % IV SOLN
1.0000 g | Freq: Once | INTRAVENOUS | Status: AC
  Administered 2018-08-06: 1 g via INTRAVENOUS

## 2018-08-06 MED ORDER — ONDANSETRON HCL 4 MG/2ML IJ SOLN: 4.0000 mg | Freq: Four times a day (QID) | INTRAMUSCULAR | Status: DC | PRN

## 2018-08-06 MED ORDER — HYDRALAZINE HCL 20 MG/ML IJ SOLN: 10.0000 mg | Freq: Four times a day (QID) | INTRAMUSCULAR | Status: DC | PRN

## 2018-08-06 MED ORDER — OXYCODONE HCL 5 MG PO TABS
5.0000 mg | ORAL_TABLET | Freq: Four times a day (QID) | ORAL | Status: DC | PRN
  Administered 2018-08-06 – 2018-08-08 (×5): 5 mg via ORAL
  Filled 2018-08-06 (×5): qty 1

## 2018-08-06 MED ORDER — VANCOMYCIN HCL 500 MG IV SOLR
500.0000 mg | INTRAVENOUS | Status: DC
  Administered 2018-08-07: 500 mg via INTRAVENOUS
  Filled 2018-08-06 (×2): qty 500

## 2018-08-06 MED ORDER — SODIUM CHLORIDE 0.9 % IV SOLN
1.0000 g | Freq: Two times a day (BID) | INTRAVENOUS | Status: DC
  Administered 2018-08-06 – 2018-08-08 (×4): 1 g via INTRAVENOUS
  Filled 2018-08-06 (×4): qty 1

## 2018-08-06 MED ORDER — ADULT MULTIVITAMIN W/MINERALS CH
1.0000 | ORAL_TABLET | Freq: Every day | ORAL | Status: DC
  Administered 2018-08-06 – 2018-08-08 (×3): 1 via ORAL
  Filled 2018-08-06 (×3): qty 1

## 2018-08-06 MED ORDER — MEGESTROL ACETATE 625 MG/5ML PO SUSP
625.0000 mg | Freq: Every day | ORAL | Status: DC
  Filled 2018-08-06 (×2): qty 5

## 2018-08-06 MED ORDER — LACTULOSE 10 GM/15ML PO SOLN: 10.0000 g | Freq: Two times a day (BID) | ORAL | Status: DC | PRN

## 2018-08-06 MED ORDER — VANCOMYCIN HCL IN DEXTROSE 1-5 GM/200ML-% IV SOLN
1000.0000 mg | Freq: Once | INTRAVENOUS | Status: AC
  Administered 2018-08-06: 1000 mg via INTRAVENOUS
  Filled 2018-08-06: qty 200

## 2018-08-06 MED ORDER — DRONABINOL 2.5 MG PO CAPS
2.5000 mg | ORAL_CAPSULE | Freq: Two times a day (BID) | ORAL | Status: DC
  Administered 2018-08-06 – 2018-08-08 (×4): 2.5 mg via ORAL
  Filled 2018-08-06 (×4): qty 1

## 2018-08-06 MED ORDER — IPRATROPIUM-ALBUTEROL 0.5-2.5 (3) MG/3ML IN SOLN
3.0000 mL | Freq: Four times a day (QID) | RESPIRATORY_TRACT | Status: DC
  Administered 2018-08-06: 3 mL via RESPIRATORY_TRACT
  Filled 2018-08-06: qty 3

## 2018-08-06 MED ORDER — CEFEPIME HCL 1 G IJ SOLR
INTRAMUSCULAR | Status: AC
  Filled 2018-08-06: qty 1

## 2018-08-06 MED ORDER — IOPAMIDOL (ISOVUE-370) INJECTION 76%
100.0000 mL | Freq: Once | INTRAVENOUS | Status: AC | PRN
  Administered 2018-08-06: 100 mL via INTRAVENOUS

## 2018-08-06 MED ORDER — ACETAMINOPHEN 325 MG PO TABS: 650.0000 mg | ORAL_TABLET | Freq: Four times a day (QID) | ORAL | Status: DC | PRN

## 2018-08-06 MED ORDER — GUAIFENESIN-DM 100-10 MG/5ML PO SYRP
5.0000 mL | ORAL_SOLUTION | ORAL | Status: DC | PRN
  Administered 2018-08-06 – 2018-08-07 (×2): 5 mL via ORAL
  Administered 2018-08-07: 20:00:00 via ORAL
  Administered 2018-08-08 (×2): 5 mL via ORAL
  Filled 2018-08-06 (×5): qty 10

## 2018-08-06 MED ORDER — METHYLPREDNISOLONE SODIUM SUCC 125 MG IJ SOLR
125.0000 mg | Freq: Once | INTRAMUSCULAR | Status: AC
  Administered 2018-08-06: 125 mg via INTRAVENOUS
  Filled 2018-08-06: qty 2

## 2018-08-06 MED ORDER — IPRATROPIUM-ALBUTEROL 0.5-2.5 (3) MG/3ML IN SOLN: 3.0000 mL | Freq: Four times a day (QID) | RESPIRATORY_TRACT | Status: DC

## 2018-08-06 MED ORDER — DOXEPIN HCL 3 MG PO TABS: 3.0000 mg | ORAL_TABLET | Freq: Every day | ORAL | Status: DC

## 2018-08-06 MED ORDER — SODIUM CHLORIDE 0.9 % IV BOLUS
1000.0000 mL | Freq: Once | INTRAVENOUS | Status: AC
  Administered 2018-08-06: 1000 mL via INTRAVENOUS

## 2018-08-06 MED ORDER — ACETAMINOPHEN 650 MG RE SUPP: 650.0000 mg | Freq: Four times a day (QID) | RECTAL | Status: DC | PRN

## 2018-08-06 MED ORDER — SODIUM CHLORIDE 0.9 % IV SOLN
INTRAVENOUS | Status: AC
  Administered 2018-08-06 – 2018-08-07 (×2): via INTRAVENOUS

## 2018-08-06 MED ORDER — ONDANSETRON HCL 4 MG PO TABS: 4.0000 mg | ORAL_TABLET | Freq: Four times a day (QID) | ORAL | Status: DC | PRN

## 2018-08-06 MED ORDER — ALBUTEROL SULFATE (2.5 MG/3ML) 0.083% IN NEBU: 2.5000 mg | INHALATION_SOLUTION | RESPIRATORY_TRACT | Status: DC | PRN

## 2018-08-06 MED ORDER — OXANDROLONE 2.5 MG PO TABS
10.0000 mg | ORAL_TABLET | Freq: Every day | ORAL | Status: DC
  Administered 2018-08-06 – 2018-08-08 (×3): 10 mg via ORAL
  Filled 2018-08-06 (×3): qty 4

## 2018-08-06 MED ORDER — LORAZEPAM 0.5 MG PO TABS: 0.5000 mg | ORAL_TABLET | Freq: Four times a day (QID) | ORAL | Status: DC | PRN

## 2018-08-06 MED ORDER — ENOXAPARIN SODIUM 40 MG/0.4ML ~~LOC~~ SOLN
40.0000 mg | SUBCUTANEOUS | Status: DC
  Administered 2018-08-06: 40 mg via SUBCUTANEOUS
  Filled 2018-08-06: qty 0.4

## 2018-08-06 MED ORDER — METHYLPREDNISOLONE SODIUM SUCC 125 MG IJ SOLR
60.0000 mg | Freq: Two times a day (BID) | INTRAMUSCULAR | Status: DC
  Administered 2018-08-06 – 2018-08-08 (×4): 60 mg via INTRAVENOUS
  Filled 2018-08-06 (×4): qty 2

## 2018-08-06 MED ORDER — IPRATROPIUM-ALBUTEROL 0.5-2.5 (3) MG/3ML IN SOLN
3.0000 mL | Freq: Once | RESPIRATORY_TRACT | Status: AC
  Administered 2018-08-06: 3 mL via RESPIRATORY_TRACT
  Filled 2018-08-06: qty 3

## 2018-08-06 MED ORDER — SODIUM CHLORIDE 0.9 % IV SOLN
INTRAVENOUS | Status: DC | PRN
  Administered 2018-08-06: 13:00:00 via INTRAVENOUS

## 2018-08-06 MED ORDER — IPRATROPIUM-ALBUTEROL 0.5-2.5 (3) MG/3ML IN SOLN
3.0000 mL | Freq: Three times a day (TID) | RESPIRATORY_TRACT | Status: DC
  Administered 2018-08-07 – 2018-08-08 (×5): 3 mL via RESPIRATORY_TRACT
  Filled 2018-08-06 (×5): qty 3

## 2018-08-06 NOTE — H&P (Signed)
[facility] History and Physical  Donna Curry QHU:[id number] DOB: 07-15-1956 DOA: 08/06/2018   PCP: [name], NP  Specialists: [name] is her oncologist.  Dr. Lake Bells is her pulmonologist.  Chief Complaint: Shortness of breath ongoing for 1 week  HPI: Donna Curry is a 63 y.o. female with a past medical history of emphysema, current smoker, bladder cancer followed by medical and radiation oncology and urology, hypertension who was in her usual state of health till about 1 week ago when she started developing shortness of breath.  It was mild to begin with but progressively worsened.  Initially it was with exertion and then subsequently when at rest.  She had a cough which was dry.  No symptoms suggestive of orthopnea or PND.  No leg swelling.  She has had night sweats.  Does not know if she had any fever.  Denies any chills.  No sick contacts.  No nausea or vomiting recently.  No recent travel.  Since her symptoms were getting worse she decided to present to the emergency department.  In the emergency department she was found to be hypoxic, saturating in the 70s.  She was placed on oxygen.  She was given nebulizer treatment.  Evaluation revealed lung infiltrates suggesting pneumonia.  Patient will need hospitalization for further management.  Home Medications: Prior to Admission medications   Medication Sig Start Date End Date Taking? Authorizing Provider  acetaminophen (TYLENOL) 500 MG tablet Take 1,000 mg by mouth every 8 (eight) hours as needed for moderate pain.    [provider]  amLODipine (NORVASC) 10 MG tablet Take 5 mg by mouth daily.    [provider]  budesonide-formoterol (SYMBICORT) 160-4.5 MCG/ACT inhaler Inhale 2 puffs into the lungs 2 (two) times daily. 06/29/18   [name], NP  cephALEXin (KEFLEX) 500 MG capsule Take 1 capsule (500 mg total) by mouth 3 (three) times daily. 06/29/18   [name], NP  docusate  sodium (COLACE) 100 MG capsule Take 1 capsule (100 mg total) by mouth 2 (two) times daily. 12/14/17   [name], MD  Doxepin HCl (SILENOR) 3 MG TABS Take 1 tablet (3 mg total) by mouth at bedtime. 03/22/18   [name], MD  Ipratropium-Albuterol (COMBIVENT) 20-100 MCG/ACT AERS respimat Inhale 1 puff into the lungs every 6 (six) hours. 06/29/18   [name], NP  lactulose (CHRONULAC) 10 GM/15ML solution Take 15 mLs (10 g total) by mouth 2 (two) times daily as needed for mild constipation or moderate constipation. 03/29/18   [place], Holli Humbles, NP  lidocaine-prilocaine (EMLA) cream Apply to affected area once 03/01/18   [name], MD  LORazepam (ATIVAN) 0.5 MG tablet Take 1 tablet (0.5 mg total) by mouth every 6 (six) hours as needed for anxiety (Nausea or vomiting). 06/29/18   [name], MD  MARINOL 2.5 MG capsule TAKE 1 CAPSULE BY MOUTH TWICE DAILY BEFORE A MEAL 02/28/18   [name], MD  megestrol (MEGACE ES) 625 MG/5ML suspension Take 5 mLs (625 mg total) by mouth daily. 12/28/17   [name], MD  nitrofurantoin, macrocrystal-monohydrate, (MACROBID) 100 MG capsule Take 1 capsule (100 mg total) by mouth 2 (two) times daily. 04/26/18   [name], MD  ondansetron (ZOFRAN) 8 MG tablet Take 1 tablet (8 mg total) by mouth 2 (two) times daily as needed. Start on the third day after chemotherapy. 03/01/18   [name], MD  oxandrolone (OXANDRIN) 10 MG  tablet Take 1 tablet (10 mg total) by mouth daily. 12/28/17   [name], MD  oxyCODONE (OXY IR/ROXICODONE) 5 MG immediate release tablet Take 1 tablet (5 mg total) by mouth every 6 (six) hours as needed for severe pain. 06/29/18   [name], MD  prochlorperazine (COMPAZINE) 10 MG tablet Take 1 tablet (10 mg total) by mouth every 6 (six) hours as needed (Nausea or vomiting). 03/01/18   [name], MD  sulfamethoxazole-trimethoprim (BACTRIM DS,SEPTRA DS) 800-160 MG tablet TAKE 1 TABLET BY MOUTH TWICE  DAILY 05/02/18   [name], MD  varenicline (CHANTIX PAK) 0.5 MG X 11 & 1 MG X 42 tablet Take one 0.5 mg tablet daily x 3 days, then increase to one 0.5 mg tab twice daily x 4 days, then increase to one 1 mg tablet twice daily. 05/18/18   [name], NP    Allergies: No Known Allergies  Past Medical History: Past Medical History:  Diagnosis Date  . Anemia   . Family history of adverse reaction to anesthesia    brother wakes up and dont know who he is and sister has n/v  . Goals of care, counseling/discussion 02/09/2018  . History of kidney stones   . Hypertension   . Iron deficiency anemia due to chronic blood loss 03/01/2018  . Measles as a child  . Mumps as a child  . TIA (transient ischemic attack)   . Tobacco abuse     Past Surgical History:  Procedure Laterality Date  . CYSTOSCOPY W/ URETERAL STENT PLACEMENT Right 12/13/2017   Procedure: CYSTOSCOPY WITH RIGHT RETROGRADE PYELOGRAM/URETERAL RIGHT STENT PLACEMENT;  Surgeon: [name], MD;  Location: [facility];  Service: Urology;  Laterality: Right;  . IR IMAGING GUIDED PORT INSERTION  02/23/2018  . TRANSURETHRAL RESECTION OF BLADDER TUMOR N/A 12/13/2017   Procedure: TRANSURETHRAL RESECTION OF BLADDER TUMOR (TURBT);  Surgeon: [name], MD;  Location: [facility];  Service: Urology;  Laterality: N/A;  . TRANSURETHRAL RESECTION OF BLADDER TUMOR WITH GYRUS (TURBT-GYRUS)     [name] 12-13-17  . tubes tied    . uterine ablation     about 2005    Social History: She continues to smoke about half a pack of cigarettes on a daily basis.  No alcohol use.  No illicit drug use.  Usually independent with daily activities.  Family History:  Family History  Problem Relation Age of Onset  . Hypertension Mother   . Diabetes Father   . Hypertension Brother   . HIV Brother      Review of Systems - History obtained from the patient General ROS: positive for  - fatigue and night sweats Psychological ROS:  negative Ophthalmic ROS: negative ENT ROS: negative Allergy and Immunology ROS: negative Hematological and Lymphatic ROS: negative Endocrine ROS: negative Respiratory ROS: as in hpi Cardiovascular ROS: as in hpi Gastrointestinal ROS: no abdominal pain, change in bowel habits, or black or bloody stools Genito-Urinary ROS: no dysuria, trouble voiding, or hematuria Musculoskeletal ROS: negative Neurological ROS: no TIA or stroke symptoms Dermatological ROS: negative  Physical Examination  Vitals:   08/06/18 1300 08/06/18 1330 08/06/18 1400 08/06/18 1415  BP: (!) 149/86 (!) 151/94 (!) 160/89   Pulse: 94 94 95 96  Resp: 16 15 15  (!) 26  Temp:      TempSrc:      SpO2: 98% 96% 95% 94%  Weight:      Height:  BP (!) 160/89   Pulse 96   Temp 98.9 F (37.2 C) (Rectal)   Resp (!) 26   Ht 5\' 5"  (1.651 m)   Wt 35.4 kg   SpO2 94%   BMI 12.98 kg/m   General appearance: alert, cooperative, appears stated age, cachectic and no distress Head: Normocephalic, without obvious abnormality, atraumatic Eyes: conjunctivae/corneas clear. PERRL, EOM's intact. Throat: lips, mucosa, and tongue normal; teeth and gums normal Neck: no adenopathy, no carotid bruit, no JVD, supple, symmetrical, trachea midline and thyroid not enlarged, symmetric, no tenderness/mass/nodules Resp: Noted to be tachypneic at rest.  No use of accessory muscles.  Diminished air entry diffusely.  Crackles at the bases.  No wheezing or rhonchi. Cardio: regular rate and rhythm, S1, S2 normal, no murmur, click, rub or gallop GI: soft, non-tender; bowel sounds normal; no masses,  no organomegaly Extremities: extremities normal, atraumatic, no cyanosis or edema Pulses: 2+ and symmetric Skin: Skin color, texture, turgor normal. No rashes or lesions Lymph nodes: Cervical, supraclavicular, and axillary nodes normal. Neurologic: Alert and oriented x3.  No focal neurological deficits.   Labs on Admission: I have personally  reviewed following labs and imaging studies  CBC: Recent Labs  Lab 08/06/18 1041  WBC 5.6  NEUTROABS 4.2  HGB 11.4*  HCT 36.7  MCV 99.5  PLT 376   Basic Metabolic Panel: Recent Labs  Lab 08/06/18 1041  NA 138  K 4.0  CL 98  CO2 32  GLUCOSE 100*  BUN 9  CREATININE 0.80  CALCIUM 9.2   GFR: Estimated Creatinine Clearance: 40.7 mL/min (by C-G formula based on SCr of 0.8 mg/dL). Liver Function Tests: Recent Labs  Lab 08/06/18 1041  AST 18  ALT 16  ALKPHOS 86  BILITOT 0.4  PROT 7.5  ALBUMIN 3.5   Cardiac Enzymes: Recent Labs  Lab 08/06/18 1041  TROPONINI 0.03*     Radiological Exams on Admission: Dg Chest 2 View  Result Date: 08/06/2018 CLINICAL DATA:  Shortness of breath. EXAM: CHEST - 2 VIEW COMPARISON:  PET CT 07/05/2018. Chest x-ray 02/23/2018. Chest x-ray 01/11/2018. Chest x-ray 11/03/2017. FINDINGS: Port-A-Cath noted with tip over superior vena cava. Heart size normal. Left nipple shadow again noted. Mild bibasilar pleural-parenchymal thickening again noted consistent scarring. IMPRESSION: 1. Port-A-Cath noted in stable position with tip over superior vena cava. 2. Basal pleural-parenchymal thickening most consistent with scarring again noted. No acute pulmonary disease noted. Electronically Signed   By: [place]   On: 08/06/2018 11:04   Ct Angio Chest Pe W And/or Wo Contrast  Result Date: 08/06/2018 CLINICAL DATA:  Shortness of breath and cough. EXAM: CT ANGIOGRAPHY CHEST WITH CONTRAST TECHNIQUE: Multidetector CT imaging of the chest was performed using the standard protocol during bolus administration of intravenous contrast. Multiplanar CT image reconstructions and MIPs were obtained to evaluate the vascular anatomy. CONTRAST:  181mL ISOVUE-370 IOPAMIDOL (ISOVUE-370) INJECTION 76% COMPARISON:  Chest x-ray dated 08/06/2018 and chest CT dated 11/12/2017 FINDINGS: Cardiovascular: Satisfactory opacification of the pulmonary arteries to the segmental level.  No evidence of pulmonary embolism. Normal heart size. No pericardial effusion. Aortic atherosclerosis. Mediastinum/Nodes: No enlarged mediastinal, hilar, or axillary lymph nodes. Thyroid gland, trachea, and esophagus demonstrate no significant findings. Lungs/Pleura: There are severe emphysematous changes bilaterally primarily in the upper lobes. There are tiny areas of focal infiltrate or atelectasis at both lung bases. No effusions. Upper Abdomen: No acute abnormality. Focal small areas of cortical atrophy of both kidneys, possibly due to remote infection or infarction. Musculoskeletal:  The patient has very little body fat. No chest wall abnormality. No acute or significant osseous findings. Review of the MIP images confirms the above findings. IMPRESSION: No evidence of pulmonary emboli. Aortic Atherosclerosis (ICD10-I70.0) and Emphysema (ICD10-J43.9). Tiny areas of infiltrate at both lung bases posterior medially. Electronically Signed   By: Lorriane Shire M.D.   On: 08/06/2018 12:04    My interpretation of Electrocardiogram: Sinus rhythm in the 90s.  Normal axis.  Left ventricular hypertrophy.  Nonspecific ST changes probably related to LVH.   Problem List  Principal Problem:   HCAP (healthcare-associated pneumonia) Active Problems:   TOBACCO ABUSE   Essential hypertension   Protein-energy malnutrition (HCC)   Bladder cancer ([place])   Emphysema of lung ([place])   Acute on chronic respiratory failure with hypoxia (HCC)   Assessment: This is a 63 year old African-American female with a past medical history as stated earlier who comes in with a one-week history of shortness of breath.  She is found to have pulmonary infiltrate.  She has emphysema.  She continues to smoke cigarettes.  She was profoundly hypoxic at presentation.  She likely has low lung reserve due to her emphysema.  I think her presentation is due to a combination of pneumonia as well as COPD exacerbation.  Plan:  1. Healthcare  associated pneumonia: Has been getting chemotherapy up until recently.  Has a Port-A-Cath.  Patient will be placed on vancomycin and cefepime for now.  Follow-up on cultures.  De-escalate antibiotics once culture data is available.  Check urine for strep and Legionella.  Influenza PCR negative.  Lactic acid 0.74.  No clear evidence for sepsis at this time.  2.  Acute respiratory failure with hypoxia: Most likely secondary to the above as well as an element of COPD exacerbation.  She is currently on 40% Ventimask.  She saturating 99%.  She should be able to be weaned down.  Due to her emphysema she does not need to be more than 90% or so.  CT scan was negative for pulmonary embolism.  3.  Acute COPD exacerbation: She does not appear to have any reactive airway disease but does have a emphysema.  Noted on CT scan as well.  She is followed by pulmonology.  She unfortunately continues to smoke cigarettes.  She will be placed on nebulizer treatments.  Continued on her inhaled steroids.  She might benefit from systemic steroids as well which will be initiated.  4.  History of bladder cancer: Followed by urology ([name]), radiation oncology and medical oncology.  Last chemotherapy she tells me was about a month ago.  She supposed to follow-up with urology sometime this month for a cystoscopy.  5.  Severe protein calorie malnutrition: She has had weight loss.  She is on appetite stimulants as well as medications to make her gain weight.  She tells me that she is gained about 5 pounds recently.  6.  Essential hypertension: Monitor blood pressures closely.  Hydralazine as needed.  Home medications to be reconciled.  7.  Tobacco abuse: Patient was counseled.  Unclear if she is on Chantix or not.  This will need to be verified.  Nicotine patch for now.  8.  Normocytic anemia: Likely anemia of chronic disease.  Continue to monitor.    DVT Prophylaxis: Lovenox Code Status: Full code Family Communication:  Discussed with the patient Consults called: None  Severity of Illness: The appropriate patient status for this patient is INPATIENT. Inpatient status is judged to be reasonable  and necessary in order to provide the required intensity of service to ensure the patient's safety. The patient's presenting symptoms, physical exam findings, and initial radiographic and laboratory data in the context of their chronic comorbidities is felt to place them at high risk for further clinical deterioration. Furthermore, it is not anticipated that the patient will be medically stable for discharge from the hospital within 2 midnights of admission. The following factors support the patient status of inpatient.   " The patient's presenting symptoms include shortness of breath. " The worrisome physical exam findings include severe hypoxia, tachypnea. " The initial radiographic and laboratory data are worrisome because of pneumonia. " The chronic co-morbidities include bladder cancer.   * I certify that at the point of admission it is my clinical judgment that the patient will require inpatient hospital care spanning beyond 2 midnights from the point of admission due to high intensity of service, high risk for further deterioration and high frequency of surveillance required.*    Further management decisions will depend on results of further testing and patient's response to treatment.   Milissa Fesperman [employer]  [facility] operational officer on [email or web address]  08/06/2018, 4:11 PM

## 2018-08-06 NOTE — ED Provider Notes (Signed)
[facility] EMERGENCY DEPARTMENT Provider Note   CSN: [id number] Arrival date & time: 08/06/18  1002     History   Chief Complaint Chief Complaint  Patient presents with  . Shortness of Breath    HPI KEIRAH KONITZER is a 63 y.o. female hx of bladder cancer s/p chemo a month ago, here presenting with shortness of breath, cough.  Patient states that he has been short of breath for the last week or so.  She states that it progressively gets worse and now she has shortness of breath with minimal exertion.  She has a nonproductive cough as well but denies fever.  She does have some subjective chills.  He was noted to be hypoxic 71% on room air in triage and was put on 3 L nasal cannula.  She also received 1 DuoNeb prior to my exam.  She states that she has no known COPD and does not smoke and does not wear oxygen at home.  Denies any recent travels.  The history is provided by the patient.    Past Medical History:  Diagnosis Date  . Anemia   . Family history of adverse reaction to anesthesia    brother wakes up and dont know who he is and sister has n/v  . Goals of care, counseling/discussion 02/09/2018  . History of kidney stones   . Hypertension   . Iron deficiency anemia due to chronic blood loss 03/01/2018  . Measles as a child  . Mumps as a child  . TIA (transient ischemic attack)   . Tobacco abuse     Patient Active Problem List   Diagnosis Date Noted  . Iron deficiency anemia due to chronic blood loss 03/01/2018  . Goals of care, counseling/discussion 02/09/2018  . Bladder cancer ([place]) 12/13/2017  . Hypertensive cardiomyopathy, 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[place]) 04/29/2016  . Hypokalemia 04/02/2016  . Protein-energy malnutrition ([place]) 04/02/2016  . Hypertensive emergency 04/01/2016  . Dizziness and giddiness 04/12/2013  . Palpitations 03/27/2013  . Weight loss 06/08/2012  . Degenerative disc disease, lumbar 01/30/2012  . Adrenal mass ([place]) 01/30/2012  .  Hypercalcemia 01/30/2012  . Hyperproteinemia 01/30/2012  . Borderline diabetes 01/30/2012  . Normocytic anemia 03/11/2011  . TOBACCO ABUSE 08/31/2009  . Essential hypertension 08/31/2009    Past Surgical History:  Procedure Laterality Date  . CYSTOSCOPY W/ URETERAL STENT PLACEMENT Right 12/13/2017   Procedure: CYSTOSCOPY WITH RIGHT RETROGRADE PYELOGRAM/URETERAL RIGHT STENT PLACEMENT;  Surgeon: [name], MD;  Location: [facility];  Service: Urology;  Laterality: Right;  . IR IMAGING GUIDED PORT INSERTION  02/23/2018  . TRANSURETHRAL RESECTION OF BLADDER TUMOR N/A 12/13/2017   Procedure: TRANSURETHRAL RESECTION OF BLADDER TUMOR (TURBT);  Surgeon: [name], MD;  Location: [facility];  Service: Urology;  Laterality: N/A;  . TRANSURETHRAL RESECTION OF BLADDER TUMOR WITH GYRUS (TURBT-GYRUS)     [name] 12-13-17  . tubes tied    . uterine ablation     about 2005     OB History   No obstetric history on file.      Home Medications    Prior to Admission medications   Medication Sig Start Date End Date Taking? Authorizing Provider  acetaminophen (TYLENOL) 500 MG tablet Take 1,000 mg by mouth every 8 (eight) hours as needed for moderate pain.    [provider]  amLODipine (NORVASC) 10 MG tablet Take 5 mg by mouth daily.    [provider]  budesonide-formoterol (SYMBICORT) 160-4.5  MCG/ACT inhaler Inhale 2 puffs into the lungs 2 (two) times daily. 06/29/18   [name], NP  cephALEXin (KEFLEX) 500 MG capsule Take 1 capsule (500 mg total) by mouth 3 (three) times daily. 06/29/18   [name], NP  docusate sodium (COLACE) 100 MG capsule Take 1 capsule (100 mg total) by mouth 2 (two) times daily. 12/14/17   [name], MD  Doxepin HCl (SILENOR) 3 MG TABS Take 1 tablet (3 mg total) by mouth at bedtime. 03/22/18   [name], MD  Ipratropium-Albuterol (COMBIVENT) 20-100 MCG/ACT AERS respimat Inhale 1 puff into the lungs every 6 (six) hours.  06/29/18   [name], NP  lactulose (CHRONULAC) 10 GM/15ML solution Take 15 mLs (10 g total) by mouth 2 (two) times daily as needed for mild constipation or moderate constipation. 03/29/18   [place], Holli Humbles, NP  lidocaine-prilocaine (EMLA) cream Apply to affected area once 03/01/18   [name], MD  LORazepam (ATIVAN) 0.5 MG tablet Take 1 tablet (0.5 mg total) by mouth every 6 (six) hours as needed for anxiety (Nausea or vomiting). 06/29/18   [name], MD  MARINOL 2.5 MG capsule TAKE 1 CAPSULE BY MOUTH TWICE DAILY BEFORE A MEAL 02/28/18   [name], MD  megestrol (MEGACE ES) 625 MG/5ML suspension Take 5 mLs (625 mg total) by mouth daily. 12/28/17   [name], MD  nitrofurantoin, macrocrystal-monohydrate, (MACROBID) 100 MG capsule Take 1 capsule (100 mg total) by mouth 2 (two) times daily. 04/26/18   [name], MD  ondansetron (ZOFRAN) 8 MG tablet Take 1 tablet (8 mg total) by mouth 2 (two) times daily as needed. Start on the third day after chemotherapy. 03/01/18   [name], MD  oxandrolone (OXANDRIN) 10 MG tablet Take 1 tablet (10 mg total) by mouth daily. 12/28/17   [name], MD  oxyCODONE (OXY IR/ROXICODONE) 5 MG immediate release tablet Take 1 tablet (5 mg total) by mouth every 6 (six) hours as needed for severe pain. 06/29/18   [name], MD  prochlorperazine (COMPAZINE) 10 MG tablet Take 1 tablet (10 mg total) by mouth every 6 (six) hours as needed (Nausea or vomiting). 03/01/18   [name], MD  sulfamethoxazole-trimethoprim (BACTRIM DS,SEPTRA DS) 800-160 MG tablet TAKE 1 TABLET BY MOUTH TWICE DAILY 05/02/18   [name], MD  varenicline (CHANTIX PAK) 0.5 MG X 11 & 1 MG X 42 tablet Take one 0.5 mg tablet daily x 3 days, then increase to one 0.5 mg tab twice daily x 4 days, then increase to one 1 mg tablet twice daily. 05/18/18   [name], NP    Family History Family History  Problem Relation Age of Onset  .  Hypertension Mother   . Diabetes Father   . Hypertension Brother   . HIV Brother     Social History Social History   Tobacco Use  . Smoking status: Current Some Day Smoker    Packs/day: 0.50    Types: Cigarettes    Last attempt to quit: 06/15/2018    Years since quitting: 0.1  . Smokeless tobacco: Never Used  Substance Use Topics  . Alcohol use: No    Alcohol/week: 0.0 standard drinks  . Drug use: Yes    Comment: marijuana denies at preop     Allergies   Patient has no known allergies.   Review of Systems Review of Systems  Respiratory: Positive for shortness of breath.  All other systems reviewed and are negative.    Physical Exam Updated Vital Signs BP (!) 177/87   Pulse (!) 118   Temp 98.9 F (37.2 C) (Rectal)   Resp (!) 22   Ht 5\' 5"  (1.651 m)   Wt 35.4 kg   SpO2 90%   BMI 12.98 kg/m   Physical Exam Vitals signs and nursing note reviewed.  Constitutional:      Comments: Cachetic, chronically ill   HENT:     Head: Normocephalic.     Mouth/Throat:     Mouth: Mucous membranes are moist.  Eyes:     Extraocular Movements: Extraocular movements intact.     Pupils: Pupils are equal, round, and reactive to light.  Neck:     Musculoskeletal: Normal range of motion and neck supple.  Cardiovascular:     Rate and Rhythm: Normal rate and regular rhythm.  Pulmonary:     Comments: Tachypneic, diminished throughout, no obvious crackles  Abdominal:     General: Bowel sounds are normal.     Palpations: Abdomen is soft.  Musculoskeletal: Normal range of motion.  Skin:    General: Skin is warm.     Capillary Refill: Capillary refill takes less than 2 seconds.  Neurological:     General: No focal deficit present.     Mental Status: She is alert and oriented to person, place, and time.  Psychiatric:        Mood and Affect: Mood normal.        Behavior: Behavior normal.      ED Treatments / Results  Labs (all labs ordered are listed, but only abnormal  results are displayed) Labs Reviewed  COMPREHENSIVE METABOLIC PANEL - Abnormal; Notable for the following components:      Result Value   Glucose, Bld 100 (*)    All other components within normal limits  CBC WITH DIFFERENTIAL/PLATELET - Abnormal; Notable for the following components:   RBC 3.69 (*)    Hemoglobin 11.4 (*)    All other components within normal limits  URINALYSIS, ROUTINE W REFLEX MICROSCOPIC - Abnormal; Notable for the following components:   Hgb urine dipstick SMALL (*)    Protein, ur 100 (*)    Leukocytes, UA SMALL (*)    All other components within normal limits  TROPONIN I - Abnormal; Notable for the following components:   Troponin I 0.03 (*)    All other components within normal limits  URINALYSIS, MICROSCOPIC (REFLEX) - Abnormal; Notable for the following components:   Bacteria, UA FEW (*)    Trichomonas, UA PRESENT (*)    All other components within normal limits  I-STAT VENOUS BLOOD GAS, ED - Abnormal; Notable for the following components:   pO2, Ven 49.0 (*)    Bicarbonate 33.8 (*)    TCO2 35 (*)    Acid-Base Excess 7.0 (*)    All other components within normal limits  CULTURE, BLOOD (ROUTINE X 2)  CULTURE, BLOOD (ROUTINE X 2)  INFLUENZA PANEL BY PCR (TYPE A & B)  BLOOD GAS, VENOUS  I-STAT CG4 LACTIC ACID, ED  I-STAT CG4 LACTIC ACID, ED    EKG EKG Interpretation  Date/Time:  Monday August 06 2018 10:17:03 EST Ventricular Rate:  99 PR Interval:  100 QRS Duration: 82 QT Interval:  350 QTC Calculation: 449 R Axis:   86 Text Interpretation:  Sinus rhythm with short PR Biatrial enlargement Left ventricular hypertrophy with repolarization abnormality Abnormal ECG No significant change since last tracing  Confirmed by Wandra Arthurs [phone]) on 08/06/2018 11:05:18 AM   Radiology Dg Chest 2 View  Result Date: 08/06/2018 CLINICAL DATA:  Shortness of breath. EXAM: CHEST - 2 VIEW COMPARISON:  PET CT 07/05/2018. Chest x-ray 02/23/2018. Chest x-ray  01/11/2018. Chest x-ray 11/03/2017. FINDINGS: Port-A-Cath noted with tip over superior vena cava. Heart size normal. Left nipple shadow again noted. Mild bibasilar pleural-parenchymal thickening again noted consistent scarring. IMPRESSION: 1. Port-A-Cath noted in stable position with tip over superior vena cava. 2. Basal pleural-parenchymal thickening most consistent with scarring again noted. No acute pulmonary disease noted. Electronically Signed   By: [place]   On: 08/06/2018 11:04   Ct Angio Chest Pe W And/or Wo Contrast  Result Date: 08/06/2018 CLINICAL DATA:  Shortness of breath and cough. EXAM: CT ANGIOGRAPHY CHEST WITH CONTRAST TECHNIQUE: Multidetector CT imaging of the chest was performed using the standard protocol during bolus administration of intravenous contrast. Multiplanar CT image reconstructions and MIPs were obtained to evaluate the vascular anatomy. CONTRAST:  146mL ISOVUE-370 IOPAMIDOL (ISOVUE-370) INJECTION 76% COMPARISON:  Chest x-ray dated 08/06/2018 and chest CT dated 11/12/2017 FINDINGS: Cardiovascular: Satisfactory opacification of the pulmonary arteries to the segmental level. No evidence of pulmonary embolism. Normal heart size. No pericardial effusion. Aortic atherosclerosis. Mediastinum/Nodes: No enlarged mediastinal, hilar, or axillary lymph nodes. Thyroid gland, trachea, and esophagus demonstrate no significant findings. Lungs/Pleura: There are severe emphysematous changes bilaterally primarily in the upper lobes. There are tiny areas of focal infiltrate or atelectasis at both lung bases. No effusions. Upper Abdomen: No acute abnormality. Focal small areas of cortical atrophy of both kidneys, possibly due to remote infection or infarction. Musculoskeletal: The patient has very little body fat. No chest wall abnormality. No acute or significant osseous findings. Review of the MIP images confirms the above findings. IMPRESSION: No evidence of pulmonary emboli. Aortic  Atherosclerosis (ICD10-I70.0) and Emphysema (ICD10-J43.9). Tiny areas of infiltrate at both lung bases posterior medially. Electronically Signed   By: Lorriane Shire M.D.   On: 08/06/2018 12:04    Procedures Procedures (including critical care time)  CRITICAL CARE Performed by: Wandra Arthurs   Total critical care time: 30 minutes  Critical care time was exclusive of separately billable procedures and treating other patients.  Critical care was necessary to treat or prevent imminent or life-threatening deterioration.  Critical care was time spent personally by me on the following activities: development of treatment plan with patient and/or surrogate as well as nursing, discussions with consultants, evaluation of patient's response to treatment, examination of patient, obtaining history from patient or surrogate, ordering and performing treatments and interventions, ordering and review of laboratory studies, ordering and review of radiographic studies, pulse oximetry and re-evaluation of patient's condition.   Medications Ordered in ED Medications  vancomycin (VANCOCIN) IVPB 1000 mg/200 mL premix (has no administration in time range)  ceFEPIme (MAXIPIME) 1 g in sodium chloride 0.9 % 100 mL IVPB (1 g Intravenous New Bag/Given 08/06/18 1259)  ceFEPIme (MAXIPIME) 1 g injection (has no administration in time range)  0.9 %  sodium chloride infusion ( Intravenous New Bag/Given 08/06/18 1258)  ipratropium-albuterol (DUONEB) 0.5-2.5 (3) MG/3ML nebulizer solution 3 mL (3 mLs Nebulization Given 08/06/18 1033)  sodium chloride 0.9 % bolus 1,000 mL ( Intravenous Stopped 08/06/18 1250)  iopamidol (ISOVUE-370) 76 % injection 100 mL (100 mLs Intravenous Contrast Given 08/06/18 1129)  methylPREDNISolone sodium succinate (SOLU-MEDROL) 125 mg/2 mL injection 125 mg (125 mg Intravenous Given 08/06/18 1250)     Initial Impression /  Assessment and Plan / ED Course  I have reviewed the triage vital signs and the  nursing notes.  Pertinent labs & imaging results that were available during my care of the patient were reviewed by me and considered in my medical decision making (see chart for details).    MERIDEE BRANUM is a 64 y.o. female here with cough, SOB, hypoxia. Patient has hx of bladder cancer but finished chemo a month ago. PET scan a month ago showed no metastatic disease. Consider pneumonia vs flu vs PE. Will get labs, CXR, lactate, cultures, flu. If CXR clear, will need CTA chest.   1:08 PM CXR clear, CTA showed bilateral pneumonia. WBC nl, lactate nl. I think hypoxia likely from pneumonia and COPD. She is doing well on 40% venti mask. Flu negative. Hospitalist to admit for respiratory distress with hypoxia.   Final Clinical Impressions(s) / ED Diagnoses   Final diagnoses:  COPD exacerbation ([place])  HCAP (healthcare-associated pneumonia)  Hypoxia    ED Discharge Orders    None       Drenda Freeze, MD 08/06/18 1309

## 2018-08-06 NOTE — Progress Notes (Addendum)
Patient was transferred from [facility] at [phone]. Alert and oriented x4. No skin issue. Vital signs was taken. Call light was in patient's reach. Paged MD and admission nurse as well.  Verified home medication with patient after having a phone call from pharmacist. Patient did not have home medication with her right now. Patient will try to call someone to bring them up here later. Notified pharmacist at 1711.

## 2018-08-06 NOTE — ED Triage Notes (Addendum)
Reports shortness of breath with cough x 1 week.  RT at the bedside.  Oxygen saturation 71% on RA.  Taken to room 3 via WC. Placed on 4L via [place].  Speaking in full sentences without diffculty.

## 2018-08-06 NOTE — Progress Notes (Signed)
Patient is a 63 year old African-American female with history of bladder cancer nonmetastatic status post radiation and cisplatin chemotherapy as well as a past no history to include hypertension, tobacco abuse, acute blood loss anemia, and borderline diabetes always hypertensive cardiomyopathy who presents to [facility] for evaluation of cough and shortness of breath.  She was found to have sats of 71% on room air with rapid improvement with supplemental O2 40% Ventimask and a CTA which is negative for PE but positive for bilateral pneumonia.  She received vancomycin and cefepime secondary recent chemotherapy 1 month ago, her white count is normal. Vitals show temperature 98.9 pulse 118 blood pressure 177/87 respiration rate 22 satting 90% on 40% Ventimask. She accepted for mission of a telemetry bed for treatment of healthcare associated pneumonia with acute respiratory failure.

## 2018-08-06 NOTE — Progress Notes (Addendum)
Pharmacy Antibiotic Note  Donna Curry is a 63 y.o. female admitted on 08/06/2018 with pneumoniaPharmacy has been consulted for Vancomycin dosing.  Loading doses already given  08/06/2018:  CXR + PNA  tachypneic- placed on oxygen  Afebrile  No leukocytosis  Renal function at patient's baseline  Plan:  Decrease Cefepime 1gm IV q12h  Vancomycin 500mg  IV q24h (target AUC 400-500)  Monitor renal function and cx data   Consider check MRSA PCR  Height: 5\' 5"  (165.1 cm) Weight: 78 lb (35.4 kg) IBW/kg (Calculated) : 57  Temp (24hrs), Avg:98.8 F (37.1 C), Min:98.7 F (37.1 C), Max:98.9 F (37.2 C)  Recent Labs  Lab 08/06/18 1040 08/06/18 1041  WBC  --  5.6  CREATININE  --  0.80  LATICACIDVEN 0.74  --     Estimated Creatinine Clearance: 40.7 mL/min (by C-G formula based on SCr of 0.8 mg/dL).    No Known Allergies  Antimicrobials this admission: 1/13 Vancomycin >>  1/13 Cefepime >>   Dose adjustments this admission:  Microbiology results: 1/13 BCx:  1/13 Influenza PCR; negative MRSA PCR:   Thank you for allowing pharmacy to be a part of this patient's care.  Biagio Borg 08/06/2018 4:21 PM

## 2018-08-06 NOTE — ED Notes (Signed)
Oxygen saturation 90% on 4L [place]

## 2018-08-06 NOTE — ED Notes (Signed)
Pt back from x-ray on 4l/m [place], changed to 40% venturi mask, SpO2 92%.

## 2018-08-07 DIAGNOSIS — F17210 Nicotine dependence, cigarettes, uncomplicated: Secondary | ICD-10-CM

## 2018-08-07 DIAGNOSIS — J189 Pneumonia, unspecified organism: Principal | ICD-10-CM

## 2018-08-07 LAB — FERRITIN: Ferritin: 227 ng/mL (ref 11–307)

## 2018-08-07 LAB — HIV ANTIBODY (ROUTINE TESTING W REFLEX): HIV Screen 4th Generation wRfx: NONREACTIVE

## 2018-08-07 LAB — IRON AND TIBC
Iron: 41 ug/dL (ref 28–170)
Saturation Ratios: 18 % (ref 10.4–31.8)
TIBC: 224 ug/dL — ABNORMAL LOW (ref 250–450)
UIBC: 183 ug/dL

## 2018-08-07 LAB — COMPREHENSIVE METABOLIC PANEL
ALT: 21 U/L (ref 0–44)
AST: 28 U/L (ref 15–41)
Albumin: 3.1 g/dL — ABNORMAL LOW (ref 3.5–5.0)
Alkaline Phosphatase: 74 U/L (ref 38–126)
Anion gap: 10 (ref 5–15)
BUN: 15 mg/dL (ref 8–23)
CO2: 28 mmol/L (ref 22–32)
Calcium: 8.8 mg/dL — ABNORMAL LOW (ref 8.9–10.3)
Chloride: 103 mmol/L (ref 98–111)
Creatinine, Ser: 0.83 mg/dL (ref 0.44–1.00)
GFR calc Af Amer: >60 mL/min (ref >60)
GFR calc non Af Amer: >60 mL/min (ref >60)
Glucose, Bld: 154 mg/dL — ABNORMAL HIGH (ref 70–99)
Potassium: 3.8 mmol/L (ref 3.5–5.1)
Sodium: 141 mmol/L (ref 135–145)
Total Bilirubin: 0.3 mg/dL (ref 0.3–1.2)
Total Protein: 6.8 g/dL (ref 6.5–8.1)

## 2018-08-07 LAB — CBC
HCT: 34.7 % — ABNORMAL LOW (ref 36.0–46.0)
Hemoglobin: 10.8 g/dL — ABNORMAL LOW (ref 12.0–15.0)
MCH: 31.1 pg (ref 26.0–34.0)
MCHC: 31.1 g/dL (ref 30.0–36.0)
MCV: 100 fL (ref 80.0–100.0)
Platelets: 224 10*3/uL (ref 150–400)
RBC: 3.47 MIL/uL — ABNORMAL LOW (ref 3.87–5.11)
RDW: 13.5 % (ref 11.5–15.5)
WBC: 3.4 10*3/uL — ABNORMAL LOW (ref 4.0–10.5)
nRBC: 0 % (ref 0.0–0.2)

## 2018-08-07 MED ORDER — AMLODIPINE BESYLATE 5 MG PO TABS
5.0000 mg | ORAL_TABLET | Freq: Every day | ORAL | Status: DC
  Administered 2018-08-07 – 2018-08-08 (×2): 5 mg via ORAL
  Filled 2018-08-07 (×2): qty 1

## 2018-08-07 MED ORDER — ENOXAPARIN SODIUM 30 MG/0.3ML ~~LOC~~ SOLN
30.0000 mg | SUBCUTANEOUS | Status: DC
  Administered 2018-08-07: 30 mg via SUBCUTANEOUS
  Filled 2018-08-07: qty 0.3

## 2018-08-07 NOTE — Consult Note (Signed)
Referral MD  Reason for Referral: Locally advanced bladder cancer, pneumonia, anemia  Chief Complaint  Patient presents with  . Shortness of Breath  : I have pneumonia.  HPI: [name] is well-known to me.  She is a very nice 63 year old African-American female.  She has muscle invasive bladder cancer.  Is not metastatic.  She underwent radiation with low-dose weekly cis-platinum.  She completed treatment back in October.  She seemed to have very nice response.  She saw urology in December.  She is set up for a cystoscopy.  She had a PET scan done back on 07/05/2018.  This showed no evidence of metastatic disease.  There is some abnormal wall thickening to the right anterior lateral bladder with some SUV uptake.  She is supposed to have a cystoscopy and have this area biopsied to prove if she has residual bladder cancer.  She does have bad lung disease.  She was smoking.  She came to the office yesterday.  She has some shortness of breath.  She was sent to the emergency room.  She had a CT angiogram done.  This was negative for any pulmonary embolism.  However, there was infiltrates that were noted at the lung bases.  She has severe emphysema.  She is admitted.  She started on antibiotics.  Her labs when she came in showed a white cell count of 5.6.  Hemoglobin 11.4.  Platelet count 229.  Her BUN was 9 creatinine 0.8.  Calcium 9.2.  Albumin 3.5.  She was has been a small woman.  She has gained some weight.  It would really be nice if urology could see her while she is in the hospital to do a cystoscopy.  It is incredibly difficult for her to get over to [facility] to [facility] urology.  Her appetite is improving.  Overall, her performance status is ECOG 2.   Past Medical History:  Diagnosis Date  . Anemia   . Family history of adverse reaction to anesthesia    brother wakes up and dont know who he is and sister has n/v  . Goals of care, counseling/discussion 02/09/2018  .  History of kidney stones   . Hypertension   . Iron deficiency anemia due to chronic blood loss 03/01/2018  . Measles as a child  . Mumps as a child  . TIA (transient ischemic attack)   . Tobacco abuse   :  Past Surgical History:  Procedure Laterality Date  . CYSTOSCOPY W/ URETERAL STENT PLACEMENT Right 12/13/2017   Procedure: CYSTOSCOPY WITH RIGHT RETROGRADE PYELOGRAM/URETERAL RIGHT STENT PLACEMENT;  Surgeon: Donna Mallow, MD;  Location: [facility];  Service: Urology;  Laterality: Right;  . IR IMAGING GUIDED PORT INSERTION  02/23/2018  . TRANSURETHRAL RESECTION OF BLADDER TUMOR N/A 12/13/2017   Procedure: TRANSURETHRAL RESECTION OF BLADDER TUMOR (TURBT);  Surgeon: Donna Mallow, MD;  Location: [facility];  Service: Urology;  Laterality: N/A;  . TRANSURETHRAL RESECTION OF BLADDER TUMOR WITH GYRUS (TURBT-GYRUS)     Dr. Gloriann Curry 12-13-17  . tubes tied    . uterine ablation     about 2005  :   Current Facility-Administered Medications:  .  0.9 %  sodium chloride infusion, , Intravenous, Continuous, Donna Haff, MD, Last Rate: 75 mL/hr at 08/07/18 0600 .  acetaminophen (TYLENOL) tablet 650 mg, 650 mg, Oral, Q6H PRN **OR** acetaminophen (TYLENOL) suppository 650 mg, 650 mg, Rectal, Q6H PRN, Donna Haff, MD .  albuterol (PROVENTIL) (2.5 MG/3ML) 0.083% nebulizer solution  2.5 mg, 2.5 mg, Nebulization, Q2H PRN, Donna Haff, MD .  ceFEPIme (MAXIPIME) 1 g in sodium chloride 0.9 % 100 mL IVPB, 1 g, Intravenous, Q12H, Donna Curry, [facility], Stopped at 08/06/18 2233 .  docusate sodium (COLACE) capsule 100 mg, 100 mg, Oral, BID, Donna Haff, MD, 100 mg at 08/06/18 2159 .  Doxepin HCl TABS 3 mg, 3 mg, Oral, QHS, Donna Haff, MD .  dronabinol (MARINOL) capsule 2.5 mg, 2.5 mg, Oral, BID AC, Donna Haff, MD, 2.5 mg at 08/06/18 1846 .  enoxaparin (LOVENOX) injection 40 mg, 40 mg, Subcutaneous, Q24H, Donna Haff, MD, 40 mg at 08/06/18 1956 .  guaiFENesin-dextromethorphan (ROBITUSSIN  DM) 100-10 MG/5ML syrup 5 mL, 5 mL, Oral, Q4H PRN, Donna Haff, MD, 5 mL at 08/07/18 0404 .  hydrALAZINE (APRESOLINE) injection 10 mg, 10 mg, Intravenous, Q6H PRN, Donna Haff, MD .  ipratropium-albuterol (DUONEB) 0.5-2.5 (3) MG/3ML nebulizer solution 3 mL, 3 mL, Nebulization, TID, Donna Haff, MD .  lactulose (CHRONULAC) 10 GM/15ML solution 10 g, 10 g, Oral, BID PRN, Donna Haff, MD .  LORazepam (ATIVAN) tablet 0.5 mg, 0.5 mg, Oral, Q6H PRN, Donna Haff, MD .  megestrol (MEGACE ES) 625 MG/5ML suspension 625 mg, 625 mg, Oral, Daily, Donna Haff, MD .  methylPREDNISolone sodium succinate (SOLU-MEDROL) 125 mg/2 mL injection 60 mg, 60 mg, Intravenous, Q12H, Donna Haff, MD, 60 mg at 08/06/18 2200 .  mometasone-formoterol (DULERA) 200-5 MCG/ACT inhaler 2 puff, 2 puff, Inhalation, BID, Donna Haff, MD, 2 puff at 08/06/18 2146 .  multivitamin with minerals tablet 1 tablet, 1 tablet, Oral, Daily, Donna Haff, MD, 1 tablet at 08/06/18 1727 .  nicotine (NICODERM CQ - dosed in mg/24 hours) patch 14 mg, 14 mg, Transdermal, Daily, Donna Haff, MD, 14 mg at 08/06/18 1727 .  ondansetron (ZOFRAN) tablet 4 mg, 4 mg, Oral, Q6H PRN **OR** ondansetron (ZOFRAN) injection 4 mg, 4 mg, Intravenous, Q6H PRN, Donna Haff, MD .  oxandrolone Felipa Evener) tablet 10 mg, 10 mg, Oral, Daily, Donna Haff, MD, 10 mg at 08/06/18 1850 .  oxyCODONE (Oxy IR/ROXICODONE) immediate release tablet 5 mg, 5 mg, Oral, Q6H PRN, Donna Haff, MD, 5 mg at 08/07/18 0646 .  vancomycin (VANCOCIN) 500 mg in sodium chloride 0.9 % 100 mL IVPB, 500 mg, Intravenous, Q24H, [place], Donna Curry, RPH:  . docusate sodium  100 mg Oral BID  . Doxepin HCl  3 mg Oral QHS  . dronabinol  2.5 mg Oral BID AC  . enoxaparin (LOVENOX) injection  40 mg Subcutaneous Q24H  . ipratropium-albuterol  3 mL Nebulization TID  . megestrol  625 mg Oral Daily  . methylPREDNISolone (SOLU-MEDROL) injection  60 mg Intravenous Q12H   . mometasone-formoterol  2 puff Inhalation BID  . multivitamin with minerals  1 tablet Oral Daily  . nicotine  14 mg Transdermal Daily  . oxandrolone  10 mg Oral Daily  :  No Known Allergies:  Family History  Problem Relation Age of Onset  . Hypertension Mother   . Diabetes Father   . Hypertension Brother   . HIV Brother   :  Social History   Socioeconomic History  . Marital status: Single    Spouse name: Not on file  . Number of children: Not on file  . Years of education: Not on file  . Highest education level: Not on file  Occupational History  . Not on file  Social Needs  . Financial resource strain: Not on file  . Food insecurity:  Worry: Not on file    Inability: Not on file  . Transportation needs:    Medical: Not on file    Non-medical: Not on file  Tobacco Use  . Smoking status: Current Some Day Smoker    Packs/day: 0.50    Types: Cigarettes    Last attempt to quit: 06/15/2018    Years since quitting: 0.1  . Smokeless tobacco: Never Used  Substance and Sexual Activity  . Alcohol use: No    Alcohol/week: 0.0 standard drinks  . Drug use: Yes    Comment: marijuana denies at preop  . Sexual activity: Not Currently  Lifestyle  . Physical activity:    Days per week: Not on file    Minutes per session: Not on file  . Stress: Not on file  Relationships  . Social connections:    Talks on phone: Not on file    Gets together: Not on file    Attends religious service: Not on file    Active member of club or organization: Not on file    Attends meetings of clubs or organizations: Not on file    Relationship status: Not on file  . Intimate partner violence:    Fear of current or ex partner: Not on file    Emotionally abused: Not on file    Physically abused: Not on file    Forced sexual activity: Not on file  Other Topics Concern  . Not on file  Social History Narrative   Denies hx of drug use   Single   1 daughter age 98 lives with daughter and  grandson who is 55.   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  :  Pertinent items are noted in HPI.  Exam: As above Patient Vitals for the past 24 hrs:  BP Temp Temp src Pulse Resp SpO2 Height Weight  08/07/18 0624 (!) 160/86 98.4 F (36.9 C) Oral 88 19 99 % - -  08/07/18 0045 - - - (!) 106 19 100 % - -  08/06/18 2154 - - - - - 96 % - -  08/06/18 2146 - - - - - 100 % - -  08/06/18 1959 140/90 97.6 F (36.4 C) Axillary (!) 103 17 100 % - -  08/06/18 1800 - - - - - 90 % - -  08/06/18 1731 (!) 149/95 98.6 F (37 C) - 98 17 (!) 88 % - -  08/06/18 1600 - - - - 16 - - -  08/06/18 1415 - - - 96 (!) 26 94 % - -  08/06/18 1400 (!) 160/89 - - 95 15 95 % - -  08/06/18 1330 (!) 151/94 - - 94 15 96 % - -  08/06/18 1300 (!) 149/86 - - 94 16 98 % - -  08/06/18 1230 (!) 177/87 - - (!) 118 (!) 22 90 % - -  08/06/18 1200 (!) 159/92 - - (!) 106 17 (!) 86 % - -  08/06/18 1114 - 98.9 F (37.2 C) Rectal - - - - -  08/06/18 1106 - - - 100 (!) 33 93 % - -  08/06/18 1101 (!) 161/96 - - (!) 105 - 93 % - -  08/06/18 1045 (!) 173/103 - - (!) 106 18 (!) 84 % - -  08/06/18 1034 - - - - - 95 % - -  08/06/18 1013 - - - - - 91 % - -  08/06/18 1007 (!)  142/94 98.7 F (37.1 C) Oral (!) 103 (!) 24 (!) 71 % 5\' 5"  (1.651 m) 78 lb (35.4 kg)     Recent Labs    08/06/18 1041 08/07/18 0357  WBC 5.6 3.4*  HGB 11.4* 10.8*  HCT 36.7 34.7*  PLT 229 224   Recent Labs    08/06/18 1041 08/07/18 0357  NA 138 141  K 4.0 3.8  CL 98 103  CO2 32 28  GLUCOSE 100* 154*  BUN 9 15  CREATININE 0.80 0.83  CALCIUM 9.2 8.8*    Blood smear review: None  Pathology: None    Assessment and Plan: Donna Curry is a very charming 63 year old African-American female.  She has localized bladder cancer.  She was treated with radiation and low-dose cis-platinum.  I think the real question is whether or not she still has residual disease.  The PET scan that she had done back in December is  troublesome.  Again, she is in the hospital.  It would be very nice if urology could see her and do a cystoscopy while she is in the hospital.  This way, it would be a lot easier for the patient.  If she does have residual disease in the bladder with respect to bladder cancer, we may have to consider systemic chemotherapy.  I am not sure she really is a good candidate for a radical cystectomy.  Her labs all look all that bad.  She has an element of anemia.  We probably should check her iron studies.  We will follow along and try to help out any way that we can.  I appreciate everybody's help with Donna Curry.   Donna Haw, MD  Oswaldo Milian 41:10

## 2018-08-07 NOTE — Progress Notes (Signed)
[facility] PROGRESS NOTE  Donna Curry PPI:[id number] DOB: 13-Oct-1955 DOA: 08/06/2018  PCP: [name], NP  Brief History/Interval Summary: 63 y.o. female with a past medical history of emphysema, current smoker, bladder cancer followed by medical and radiation oncology and urology, hypertension who was in her usual state of health till about 1 week ago when she started developing shortness of breath.  She progressively worsened and then decided to come into the hospital.  She was found to have pneumonia and COPD exacerbation.  She was hospitalized for further evaluation.  She was also noted to be hypoxic with oxygen saturations in the 70s.  Reason for Visit: Acute hypoxic respiratory failure.    Consultants: Medical oncology is following  Procedures: None  Antibiotics: Currently on vancomycin and cefepime  Subjective/Interval History: Patient states that she continues to be short of breath.  Denies any cough.  No chest pain.  ROS: Denies any nausea or vomiting  Objective:  Vital Signs  Vitals:   08/06/18 2154 08/07/18 0045 08/07/18 0624 08/07/18 0829  BP:   (!) 160/86   Pulse:  (!) 106 88   Resp:  19 19   Temp:   98.4 F (36.9 C)   TempSrc:   Oral   SpO2: 96% 100% 99% 92%  Weight:      Height:        Intake/Output Summary (Last 24 hours) at 08/07/2018 1213 Last data filed at 08/07/2018 0630 Gross per 24 hour  Intake 3348.17 ml  Output 200 ml  Net 3148.17 ml   Filed Weights   08/06/18 1007  Weight: 35.4 kg    General appearance: alert, cooperative, appears stated age, cachectic and no distress Head: Normocephalic, without obvious abnormality, atraumatic Resp: Noted to be tachypneic.  No use of accessory muscles.  Diminished air entry at the bases.  No wheezing.  No definite crackles. Cardio: regular rate and rhythm, S1, S2 normal, no murmur, click, rub or gallop GI: soft, non-tender; bowel sounds normal; no masses,  no  organomegaly Extremities: extremities normal, atraumatic, no cyanosis or edema Pulses: 2+ and symmetric Neurologic: No focal neurological deficits.  Lab Results:  Data Reviewed: I have personally reviewed following labs and imaging studies  CBC: Recent Labs  Lab 08/06/18 1041 08/07/18 0357  WBC 5.6 3.4*  NEUTROABS 4.2  --   HGB 11.4* 10.8*  HCT 36.7 34.7*  MCV 99.5 100.0  PLT 229 160    Basic Metabolic Panel: Recent Labs  Lab 08/06/18 1041 08/07/18 0357  NA 138 141  K 4.0 3.8  CL 98 103  CO2 32 28  GLUCOSE 100* 154*  BUN 9 15  CREATININE 0.80 0.83  CALCIUM 9.2 8.8*    GFR: Estimated Creatinine Clearance: 39.3 mL/min (by C-G formula based on SCr of 0.83 mg/dL).  Liver Function Tests: Recent Labs  Lab 08/06/18 1041 08/07/18 0357  AST 18 28  ALT 16 21  ALKPHOS 86 74  BILITOT 0.4 0.3  PROT 7.5 6.8  ALBUMIN 3.5 3.1*    Cardiac Enzymes: Recent Labs  Lab 08/06/18 1041  TROPONINI 0.03*    Anemia Panel: Recent Labs    08/07/18 0946  FERRITIN 227  TIBC 224*  IRON 41      Radiology Studies: Dg Chest 2 View  Result Date: 08/06/2018 CLINICAL DATA:  Shortness of breath. EXAM: CHEST - 2 VIEW COMPARISON:  PET CT 07/05/2018. Chest x-ray 02/23/2018. Chest x-ray 01/11/2018. Chest x-ray 11/03/2017. FINDINGS: Port-A-Cath noted with tip over superior vena  cava. Heart size normal. Left nipple shadow again noted. Mild bibasilar pleural-parenchymal thickening again noted consistent scarring. IMPRESSION: 1. Port-A-Cath noted in stable position with tip over superior vena cava. 2. Basal pleural-parenchymal thickening most consistent with scarring again noted. No acute pulmonary disease noted. Electronically Signed   By: [place]   On: 08/06/2018 11:04   Ct Angio Chest Pe W And/or Wo Contrast  Result Date: 08/06/2018 CLINICAL DATA:  Shortness of breath and cough. EXAM: CT ANGIOGRAPHY CHEST WITH CONTRAST TECHNIQUE: Multidetector CT imaging of the chest was  performed using the standard protocol during bolus administration of intravenous contrast. Multiplanar CT image reconstructions and MIPs were obtained to evaluate the vascular anatomy. CONTRAST:  142mL ISOVUE-370 IOPAMIDOL (ISOVUE-370) INJECTION 76% COMPARISON:  Chest x-ray dated 08/06/2018 and chest CT dated 11/12/2017 FINDINGS: Cardiovascular: Satisfactory opacification of the pulmonary arteries to the segmental level. No evidence of pulmonary embolism. Normal heart size. No pericardial effusion. Aortic atherosclerosis. Mediastinum/Nodes: No enlarged mediastinal, hilar, or axillary lymph nodes. Thyroid gland, trachea, and esophagus demonstrate no significant findings. Lungs/Pleura: There are severe emphysematous changes bilaterally primarily in the upper lobes. There are tiny areas of focal infiltrate or atelectasis at both lung bases. No effusions. Upper Abdomen: No acute abnormality. Focal small areas of cortical atrophy of both kidneys, possibly due to remote infection or infarction. Musculoskeletal: The patient has very little body fat. No chest wall abnormality. No acute or significant osseous findings. Review of the MIP images confirms the above findings. IMPRESSION: No evidence of pulmonary emboli. Aortic Atherosclerosis (ICD10-I70.0) and Emphysema (ICD10-J43.9). Tiny areas of infiltrate at both lung bases posterior medially. Electronically Signed   By: Lorriane Shire M.D.   On: 08/06/2018 12:04     Medications:  Scheduled: . docusate sodium  100 mg Oral BID  . Doxepin HCl  3 mg Oral QHS  . dronabinol  2.5 mg Oral BID AC  . enoxaparin (LOVENOX) injection  30 mg Subcutaneous Q24H  . ipratropium-albuterol  3 mL Nebulization TID  . megestrol  625 mg Oral Daily  . methylPREDNISolone (SOLU-MEDROL) injection  60 mg Intravenous Q12H  . mometasone-formoterol  2 puff Inhalation BID  . multivitamin with minerals  1 tablet Oral Daily  . nicotine  14 mg Transdermal Daily  . oxandrolone  10 mg Oral Daily    Continuous: . sodium chloride 75 mL/hr at 08/07/18 0600  . ceFEPime (MAXIPIME) IV 1 g (08/07/18 0920)  . vancomycin     UXN:ATFTDDUKGURKY **OR** acetaminophen, albuterol, guaiFENesin-dextromethorphan, hydrALAZINE, lactulose, LORazepam, ondansetron **OR** ondansetron (ZOFRAN) IV, oxyCODONE    Assessment/Plan:  Healthcare associated pneumonia Patient was placed on vancomycin and cefepime on admission.  Follow-up on cultures.  Urine for strep pneumonia is negative.  Influenza PCR negative.  Lactic acid level was normal.  There was no clear evidence for sepsis at the time of admission.  If cultures remain negative antibiotics can be de-escalated tomorrow.  Patient remains afebrile.  Acute respiratory failure with hypoxia This is secondary to a combination of pneumonia as well as COPD exacerbation.  She initially was saturating 70%.  She was on Venturi mask.  Has been weaned down to oxygen by nasal cannula.  Continue to monitor.  CT scan of the chest was negative for pulmonary embolism.  Acute COPD exacerbation Patient has emphysema.  She continues to smoke however.  She was counseled.  She is currently getting systemic steroids, inhaled steroids, nebulizer treatments.  Seems to be stable.  History of bladder cancer [name] is her  medical oncologist. Patient has a Port-A-Cath. It appears that her last dose of chemotherapy was in October.  Patient is also followed by [name] with urology.  She had a PET scan which showed abnormality in the urinary bladder.  Plan was to do cystoscopy in the near future.  Will need to wait for her respiratory status to stabilize before pursuing cystoscopy.  So this may have to wait till patient is seen in the urology office.  Severe protein calorie malnutrition Patient is on appetite stimulants and medications to make her gain weight.  Continue the same.  Essential hypertension Blood pressure noted to be elevated.  Likely due to steroids.  Resume her home  medications.  Normocytic anemia Likely secondary to chronic disease.  Stable for the most part.  Tobacco abuse She was counseled.  Nicotine patch.  DVT Prophylaxis: Lovenox    Code Status: Full code Family Communication: Discussed with the patient Disposition Plan: Management as outlined above.  Hopefully she will be able to go home when she is medically ready.  Mobilize as tolerated.    LOS: 1 day   Kinya Meine [employer] on [email or web address]  08/07/2018, 12:13 PM

## 2018-08-08 LAB — BASIC METABOLIC PANEL
Anion gap: 10 (ref 5–15)
BUN: 14 mg/dL (ref 8–23)
CO2: 31 mmol/L (ref 22–32)
Calcium: 8.9 mg/dL (ref 8.9–10.3)
Chloride: 102 mmol/L (ref 98–111)
Creatinine, Ser: 0.83 mg/dL (ref 0.44–1.00)
GFR calc Af Amer: >60 mL/min (ref >60)
GFR calc non Af Amer: >60 mL/min (ref >60)
Glucose, Bld: 138 mg/dL — ABNORMAL HIGH (ref 70–99)
Potassium: 4 mmol/L (ref 3.5–5.1)
Sodium: 143 mmol/L (ref 135–145)

## 2018-08-08 LAB — CBC
HCT: 35.8 % — ABNORMAL LOW (ref 36.0–46.0)
Hemoglobin: 11.2 g/dL — ABNORMAL LOW (ref 12.0–15.0)
MCH: 31.2 pg (ref 26.0–34.0)
MCHC: 31.3 g/dL (ref 30.0–36.0)
MCV: 99.7 fL (ref 80.0–100.0)
Platelets: 260 10*3/uL (ref 150–400)
RBC: 3.59 MIL/uL — ABNORMAL LOW (ref 3.87–5.11)
RDW: 13.4 % (ref 11.5–15.5)
WBC: 7.6 10*3/uL (ref 4.0–10.5)
nRBC: 0 % (ref 0.0–0.2)

## 2018-08-08 MED ORDER — BUDESONIDE-FORMOTEROL FUMARATE 160-4.5 MCG/ACT IN AERO: 2.0000 | INHALATION_SPRAY | Freq: Two times a day (BID) | RESPIRATORY_TRACT | 1 refills | Status: DC

## 2018-08-08 MED ORDER — PREDNISONE 20 MG PO TABS: 40.0000 mg | ORAL_TABLET | Freq: Every day | ORAL | Status: DC

## 2018-08-08 MED ORDER — AZITHROMYCIN 250 MG PO TABS: ORAL_TABLET | ORAL | 0 refills | Status: DC

## 2018-08-08 MED ORDER — IPRATROPIUM-ALBUTEROL 20-100 MCG/ACT IN AERS: 1.0000 | INHALATION_SPRAY | Freq: Four times a day (QID) | RESPIRATORY_TRACT | 1 refills | Status: DC

## 2018-08-08 MED ORDER — HEPARIN SOD (PORK) LOCK FLUSH 100 UNIT/ML IV SOLN
500.0000 [IU] | INTRAVENOUS | Status: AC | PRN
  Administered 2018-08-08: 500 [IU]

## 2018-08-08 MED ORDER — PREDNISONE 20 MG PO TABS: 40.0000 mg | ORAL_TABLET | Freq: Every day | ORAL | 0 refills | Status: DC

## 2018-08-08 MED ORDER — AMOXICILLIN 500 MG PO CAPS
1000.0000 mg | ORAL_CAPSULE | Freq: Three times a day (TID) | ORAL | Status: DC
  Filled 2018-08-08: qty 2

## 2018-08-08 MED ORDER — AZITHROMYCIN 250 MG PO TABS
500.0000 mg | ORAL_TABLET | Freq: Every day | ORAL | Status: DC
  Administered 2018-08-08: 500 mg via ORAL
  Filled 2018-08-08: qty 2

## 2018-08-08 MED ORDER — AMOXICILLIN 500 MG PO CAPS: 1000.0000 mg | ORAL_CAPSULE | Freq: Three times a day (TID) | ORAL | 0 refills | Status: DC

## 2018-08-08 MED ORDER — NICOTINE 14 MG/24HR TD PT24: 14.0000 mg | MEDICATED_PATCH | Freq: Every day | TRANSDERMAL | 0 refills | Status: DC

## 2018-08-08 MED ORDER — ALBUTEROL SULFATE HFA 108 (90 BASE) MCG/ACT IN AERS: 2.0000 | INHALATION_SPRAY | Freq: Four times a day (QID) | RESPIRATORY_TRACT | 2 refills | Status: DC | PRN

## 2018-08-08 NOTE — Progress Notes (Signed)
Physical Therapy Treatment Patient Details Name: Donna Curry MRN: [id number] DOB: 04-17-1956 Today's Date: 08/08/2018    History of Present Illness 63 yo female admitted  08/06/18 for SOB, HCAP    PT Comments    Patient assessed with small based quad cane which provided  Improved gait/balance. Continue PT.  Follow Up Recommendations  No PT follow up     Equipment Recommendations  3in1 (PT) small based quad cane.   Recommendations for Other Services       Precautions / Restrictions Precautions Precautions: Fall Precaution Comments: monitor sats/ HR Restrictions Weight Bearing Restrictions: No    Mobility  Bed Mobility Overal bed mobility: Independent                Transfers Overall transfer level: Modified independent               General transfer comment: to [facility]  Ambulation/Gait Ambulation/Gait assistance: Supervision Gait Distance (Feet): 60 Feet Assistive device: Quad cane Gait Pattern/deviations: Step-through pattern;Drifts right/left Gait velocity: decr   General Gait Details: gait noted to be steady with Quad cane. Hr 112, sats 94% on RA.   Stairs             Wheelchair Mobility    Modified Rankin (Stroke Patients Only)       Balance                                            Cognition Arousal/Alertness: Awake/alert Behavior During Therapy: WFL for tasks assessed/performed Overall Cognitive Status: Within Functional Limits for tasks assessed                                        Exercises      General Comments        Pertinent Vitals/Pain Pain Assessment: No/denies pain    Home Living Family/patient expects to be discharged to:: Private residence Living Arrangements: Children;Spouse/significant other Available Help at Discharge: Family Type of Home: House Home Access: Stairs to enter   Home Layout: One level Home Equipment: None      Prior Function Level of  Independence: Needs assistance  Gait / Transfers Assistance Needed: reports limited ambulation       PT Goals (current goals can now be found in the care plan section) Acute Rehab PT Goals Patient Stated Goal: to  walk more PT Goal Formulation: With patient Time For Goal Achievement: 08/15/18 Potential to Achieve Goals: Good Progress towards PT goals: Progressing toward goals    Frequency    Min 2X/week      PT Plan Current plan remains appropriate    Co-evaluation              AM-PAC PT "6 Clicks" Mobility   Outcome Measure  Help needed turning from your back to your side while in a flat bed without using bedrails?: None Help needed moving from lying on your back to sitting on the side of a flat bed without using bedrails?: None Help needed moving to and from a bed to a chair (including a wheelchair)?: None Help needed standing up from a chair using your arms (e.g., wheelchair or bedside chair)?: A Little Help needed to walk in hospital room?: A Little Help needed climbing 3-5 steps with a railing? :  A Little 6 Click Score: 21    End of Session   Activity Tolerance: Patient tolerated treatment well Patient left: in bed Nurse Communication: Mobility status PT Visit Diagnosis: Unsteadiness on feet (R26.81)     Time: 7782-4235 PT Time Calculation (min) (ACUTE ONLY): 8 min  Charges:   Tresa Endo PT Acute Rehabilitation Services Pager [phone] Office [phone]                        Claretha Cooper 08/08/2018, 9:27 AM

## 2018-08-08 NOTE — Care Management Note (Addendum)
Case Management Note  Patient Details  Name: Donna Curry MRN: [id number] Date of Birth: 08-22-1955  Subjective/Objective:                  discharged  Action/Plan: Discharged to home with home health RN ,aide Orders checked for [facility] needs.dme =3 in 1 ordered and obtained through [facility]. rn and aide patient chooses [facility]. Text sent to Glyn Ade with [facility] Sheet of [facility] choice under medicare placed on the shadow chart..  Expected Discharge Date:  (unknown)               Expected Discharge Plan:  Home/Self Care  In-House Referral:     Discharge planning Services  CM Consult  Post Acute Care Choice:  Durable Medical Equipment Choice offered to:     DME Arranged:  3-N-1 DME Agency:  [place]:    [place] Agency:     Status of Service:  Completed, signed off  If discussed at [place] of Stay Meetings, dates discussed:    Additional Comments:  Leeroy Cha, RN 08/08/2018, 9:35 AM

## 2018-08-08 NOTE — Progress Notes (Signed)
SATURATION QUALIFICATIONS: (This note is used to comply with regulatory documentation for home oxygen)  Patient Saturations on Room Air at Rest = 100%  Patient Saturations on Room Air while Ambulating = 100%  Patient Saturations on    Liters of oxygen while Ambulating = % NT Please briefly explain why patient needs home oxygen: patient's saturation remained > 90% while ambulating.  HR up to [place] Pager [phone] Office [phone]

## 2018-08-08 NOTE — Discharge Summary (Signed)
Physician Discharge Summary  ALECE KOPPEL OXB:[id number] DOB: 09-04-1955 DOA: 08/06/2018  PCP: [name], NP  Admit date: 08/06/2018 Discharge date: 08/08/2018  Time spent: 40 minutes  Recommendations for Outpatient Follow-up:  1. Follow outpatient CBC/CMP 2. Follow up abnormal PET scan - concern for abnormal wall thickening involving the anterolateral bladder - discussed with urology, planing for outpatient to follow up - discussed with pt and oncology 3. Repeat CXR in [place] few weeks as outpatient 4. Continue to encourage smoking cessation 5. Follow COPD and dyspnea on exertion, ensure continuing to improve 6. Follow final cx, urine legionella  Discharge Diagnoses:  Principal Problem:   HCAP (healthcare-associated pneumonia) Active Problems:   TOBACCO ABUSE   Essential hypertension   Protein-energy malnutrition (HCC)   Bladder cancer ([place])   Emphysema of lung ([place])   Acute on chronic respiratory failure with hypoxia ([place])   Discharge Condition: stable   Diet recommendation: heart healthy  Filed Weights   08/06/18 1007  Weight: 35.4 kg    History of present illness:   63 y.o.femalewith Trine Fread past medical history of emphysema, current smoker, bladder cancer followed by medical and radiation oncology and urology, hypertension who was in her usual state of health till about 1 week ago when she started developing shortness of breath.  She progressively worsened and then decided to come into the hospital.  She was found to have pneumonia and COPD exacerbation.  She was hospitalized for further evaluation.  She was also noted to be hypoxic with oxygen saturations in the 70s.  She was admitted for SOB and found to have COPD exacerbation and pneumonia.  She improved with steroids, abx, and nebs.  She was improved on the day of discharge.  She was discharged with home health services.  Will need to follow up with oncology and urology as outpatient.  Hospital Course:   Healthcare associated pneumonia Vanc/cefepime -> narrow to cap coverage at discharge Negative urine strep Pending legionella Blood cx NGTD Influenza PCR negative.   Acute respiratory failure with hypoxia Resolved, improved did not desat on home O2 screen Pt still concerned with SOB, but overall seems improved, will d/c home with home health Treat with nebs, abx, steroids  Acute COPD exacerbation Continue to treat for COPD exacerbation D/c with steroids, abx Continue home nebs Continue to follow up outpatient with PCP Smoking cessation   History of bladder cancer [name] is her medical oncologist. Patient has Allisen Pidgeon [place] Tiberio-Cath.  S/p treatment with radiation and low dose cis platinum. Patient is also followed by [name] with urology.  She had Donel Osowski PET scan which showed abnormality in the urinary bladder.  Plan was to do cystoscopy in the near future.   Discussed with urology who recommended follow up outpatient. Will need close follow up with urology and oncology.  Discussed with pt.  Severe protein calorie malnutrition Patient is on appetite stimulants and medications to make her gain weight.  Continue the same.  Essential hypertension Blood pressure noted to be elevated.  Likely due to steroids.  Resume her home medications.  Normocytic anemia Likely secondary to chronic disease.  Stable for the most part.  Tobacco abuse Continue to encourage cessation   Procedures: None  Consultations:  oncology  Discharge Exam: Vitals:   08/08/18 0447 08/08/18 0826  BP: (!) 161/98   Pulse: 93   Resp: 19   Temp: 98.3 F (36.8 C)   SpO2: 94% 92%   Concerned about going home. Notes she gets SOB with  minimal exertion  Family member at bedside. Discussed reassuring exam  Observed walking with NT and she did well. Discussed return precautions, but with no O2 requirement, improvement, planning for discharge.   Discussed home health as well  General: No acute  distress. Cardiovascular: Heart sounds show Beryl Balz regular rate, and rhythm.  Lungs: Clear to auscultation bilaterally with good air movement.  Observed walking with tech down hall, did well without notable concerns. Abdomen: Soft, nontender, nondistended  Neurological: Alert and oriented 3. Moves all extremities 4. Cranial nerves II through XII grossly intact. Skin: Warm and dry. No rashes or lesions. Extremities: No clubbing or cyanosis. No edema. Pedal pulses 2+. Psychiatric: Mood and affect are normal. Insight and judgment are appropriate.   Discharge Instructions   Discharge Instructions    Call MD for:  difficulty breathing, headache or visual disturbances   Complete by:  As directed    Call MD for:  extreme fatigue   Complete by:  As directed    Call MD for:  persistant dizziness or light-headedness   Complete by:  As directed    Call MD for:  persistant nausea and vomiting   Complete by:  As directed    Call MD for:  redness, tenderness, or signs of infection (pain, swelling, redness, odor or green/yellow discharge around incision site)   Complete by:  As directed    Call MD for:  severe uncontrolled pain   Complete by:  As directed    Call MD for:  temperature >100.4   Complete by:  As directed    Diet - low sodium heart healthy   Complete by:  As directed    Diet - low sodium heart healthy   Complete by:  As directed    Discharge instructions   Complete by:  As directed    You were seen for pneumonia and Samanda Buske COPD exacerbation.   You've improved with steroids, antibiotics, and nebulizer treatments.  Continue your symbicort when you get home.  Please continue your combivent as well.  I'll prescribed albuterol for you to use as needed for shortness of breath.  Quitting smoking is extremely important for your COPD.  Please follow up with [name] as an outpatient.  Because of the concerning finding on the PET scan concerning for Alahia Whicker possible bladder mass, you need to follow  up with urology as an outpatient as soon as possible as well.  Return for new, recurrent, or worsening symptoms.  Please ask your PCP to request records from this hospitalization so they know what was done and what the next steps will be.   For home use only DME Cane   Complete by:  As directed    Small based quad cane   Increase activity slowly   Complete by:  As directed    Increase activity slowly   Complete by:  As directed      Allergies as of 08/08/2018   No Known Allergies     Medication List    STOP taking these medications   cephALEXin 500 MG capsule Commonly known as:  KEFLEX   nitrofurantoin (macrocrystal-monohydrate) 100 MG capsule Commonly known as:  MACROBID   sulfamethoxazole-trimethoprim 800-160 MG tablet Commonly known as:  BACTRIM DS,SEPTRA DS     TAKE these medications   acetaminophen 500 MG tablet Commonly known as:  TYLENOL Take 1,000 mg by mouth every 8 (eight) hours as needed for moderate pain.   albuterol 108 (90 Base) MCG/ACT inhaler Commonly known as:  PROVENTIL HFA;VENTOLIN HFA Inhale 2 puffs into the lungs every 6 (six) hours as needed for wheezing or shortness of breath.   amLODipine 10 MG tablet Commonly known as:  NORVASC Take 5 mg by mouth daily.   amoxicillin 500 MG capsule Commonly known as:  AMOXIL Take 2 capsules (1,000 mg total) by mouth every 8 (eight) hours for 5 days.   azithromycin 250 MG tablet Commonly known as:  ZITHROMAX Take 500 mg today and then 250 mg daily for the next 4 days   budesonide-formoterol 160-4.5 MCG/ACT inhaler Commonly known as:  SYMBICORT Inhale 2 puffs into the lungs 2 (two) times daily.   docusate sodium 100 MG capsule Commonly known as:  COLACE Take 1 capsule (100 mg total) by mouth 2 (two) times daily.   Doxepin HCl 3 MG Tabs Commonly known as:  SILENOR Take 1 tablet (3 mg total) by mouth at bedtime.   Ipratropium-Albuterol 20-100 MCG/ACT Aers respimat Commonly known as:   COMBIVENT Inhale 1 puff into the lungs every 6 (six) hours.   lactulose 10 GM/15ML solution Commonly known as:  CHRONULAC Take 15 mLs (10 g total) by mouth 2 (two) times daily as needed for mild constipation or moderate constipation.   lidocaine-prilocaine cream Commonly known as:  EMLA Apply to affected area once   LORazepam 0.5 MG tablet Commonly known as:  ATIVAN Take 1 tablet (0.5 mg total) by mouth every 6 (six) hours as needed for anxiety (Nausea or vomiting).   MARINOL 2.5 MG capsule Generic drug:  dronabinol TAKE 1 CAPSULE BY MOUTH TWICE DAILY BEFORE Licet Dunphy MEAL What changed:  See the new instructions.   megestrol 625 MG/5ML suspension Commonly known as:  MEGACE ES Take 5 mLs (625 mg total) by mouth daily.   nicotine 14 mg/24hr patch Commonly known as:  NICODERM CQ - dosed in mg/24 hours Place 1 patch (14 mg total) onto the skin daily. Start taking on:  August 09, 2018   ondansetron 8 MG tablet Commonly known as:  ZOFRAN Take 1 tablet (8 mg total) by mouth 2 (two) times daily as needed. Start on the third day after chemotherapy.   oxandrolone 10 MG tablet Commonly known as:  OXANDRIN Take 1 tablet (10 mg total) by mouth daily.   oxyCODONE 5 MG immediate release tablet Commonly known as:  Oxy IR/ROXICODONE Take 1 tablet (5 mg total) by mouth every 6 (six) hours as needed for severe pain.   polyethylene glycol packet Commonly known as:  MIRALAX / GLYCOLAX Take 17 g by mouth daily as needed for mild constipation.   predniSONE 20 MG tablet Commonly known as:  DELTASONE Take 2 tablets (40 mg total) by mouth daily with breakfast for 5 days. Start taking on:  August 09, 2018   prochlorperazine 10 MG tablet Commonly known as:  COMPAZINE Take 1 tablet (10 mg total) by mouth every 6 (six) hours as needed (Nausea or vomiting).   varenicline 0.5 MG X 11 & 1 MG X 42 tablet Commonly known as:  CHANTIX PAK Take one 0.5 mg tablet daily x 3 days, then increase to one 0.5  mg tab twice daily x 4 days, then increase to one 1 mg tablet twice daily.            Durable Medical Equipment  (From admission, onward)         Start     Ordered   08/08/18 1122  DME 3-in-1  Once     08/08/18 1124  08/08/18 0933  For home use only DME Bedside commode  Once    Question:  Patient needs Thayer Embleton bedside commode to treat with the following condition  Answer:  COPD (chronic obstructive pulmonary disease) ([place])   08/08/18 0933   08/08/18 0000  For home use only DME Cane    Comments:  Small based quad cane   08/08/18 1124         No Known Allergies Follow-up Information    [name], NP Follow up.   Specialty:  Internal Medicine Why:  Please call for Keyli Duross follow up appointment Contact information: [address] [place] [address] [phone]        [name], MD Follow up.   Specialty:  Urology Why:  Please call for Maritta Kief follow up appointment as soon as possible Contact information: [place] [address] [phone]        [name], MD Follow up.   Specialty:  Oncology Why:  Please call for Haynes Giannotti follow up appointment Contact information: [place] [address] [phone]            The results of significant diagnostics from this hospitalization (including imaging, microbiology, ancillary and laboratory) are listed below for reference.    Significant Diagnostic Studies: Dg Chest 2 View  Result Date: 08/06/2018 CLINICAL DATA:  Shortness of breath. EXAM: CHEST - 2 VIEW COMPARISON:  PET CT 07/05/2018. Chest x-ray 02/23/2018. Chest x-ray 01/11/2018. Chest x-ray 11/03/2017. FINDINGS: [place] Limberg-Cath noted with tip over superior vena cava. Heart size normal. Left nipple shadow again noted. Mild bibasilar pleural-parenchymal thickening again noted consistent scarring. IMPRESSION: 1. [place] Wacker-Cath noted in stable position with tip over superior vena cava. 2. Basal  pleural-parenchymal thickening most consistent with scarring again noted. No acute pulmonary disease noted. Electronically Signed   By: [place]   On: 08/06/2018 11:04   Ct Angio Chest Pe W And/or Wo Contrast  Result Date: 08/06/2018 CLINICAL DATA:  Shortness of breath and cough. EXAM: CT ANGIOGRAPHY CHEST WITH CONTRAST TECHNIQUE: Multidetector CT imaging of the chest was performed using the standard protocol during bolus administration of intravenous contrast. Multiplanar CT image reconstructions and MIPs were obtained to evaluate the vascular anatomy. CONTRAST:  145mL ISOVUE-370 IOPAMIDOL (ISOVUE-370) INJECTION 76% COMPARISON:  Chest x-ray dated 08/06/2018 and chest CT dated 11/12/2017 FINDINGS: Cardiovascular: Satisfactory opacification of the pulmonary arteries to the segmental level. No evidence of pulmonary embolism. Normal heart size. No pericardial effusion. Aortic atherosclerosis. Mediastinum/Nodes: No enlarged mediastinal, hilar, or axillary lymph nodes. Thyroid gland, trachea, and esophagus demonstrate no significant findings. Lungs/Pleura: There are severe emphysematous changes bilaterally primarily in the upper lobes. There are tiny areas of focal infiltrate or atelectasis at both lung bases. No effusions. Upper Abdomen: No acute abnormality. Focal small areas of cortical atrophy of both kidneys, possibly due to remote infection or infarction. Musculoskeletal: The patient has very little body fat. No chest wall abnormality. No acute or significant osseous findings. Review of the MIP images confirms the above findings. IMPRESSION: No evidence of pulmonary emboli. Aortic Atherosclerosis (ICD10-I70.0) and Emphysema (ICD10-J43.9). Tiny areas of infiltrate at both lung bases posterior medially. Electronically Signed   By: Lorriane Shire M.D.   On: 08/06/2018 12:04    Microbiology: Recent Results (from the past 240 hour(s))  Blood culture (routine x 2)     Status: None (Preliminary result)    Collection Time: 08/06/18 10:30 AM  Result  Value Ref Range Status   Specimen Description   Final    BLOOD RIGHT CHEST PORT Performed at [facility], [address] [place], [place] [address]    Special Requests   Final    BOTTLES DRWN EROBIC ND NEROBIC Blood Culture adequate volume Performed at [facility], [address] [place], [place] [address]    Culture   Final    NO GROWTH 2 DYS Performed at [facility], [address], [place], [place] [address]    Report Status PENDING  Incomplete  Blood culture (routine x 2)     Status: None (Preliminary result)   Collection Time: 08/06/18 10:39 M  Result Value Ref Range Status   Specimen Description   Final    BLOOD BLOOD LEFT FORERM Performed at [facility], [address] [place], [place] [address]    Special Requests   Final    BOTTLES DRWN EROBIC ND NEROBIC Blood Culture adequate volume Performed at [facility], [address] [place], [place] [address]    Culture   Final    NO GROWTH 2 DYS Performed at [facility], [place] [address] [place], [place] [address]    Report Status PENDING  Incomplete     Labs: Basic Metabolic Panel: Recent Labs  Lab 08/06/18 1041 08/07/18 0357 08/08/18 0257  N 138 141 143  K 4.0 3.8 4.0  CL 98 103 102  CO2 32 28 31  GLUCOSE 100* 154* 138*  BUN 9 15 14   CRETININE 0.80 0.83 0.83  CLCIUM 9.2 8.8* 8.9   Liver Function Tests: Recent Labs  Lab 08/06/18 1041 08/07/18 0357  ST 18 28  LT 16 21  LKPHOS 86 74  BILITOT 0.4 0.3  PROT 7.5 6.8  LBUMIN 3.5 3.1*   No results for input(s): LIPSE, MYLSE in the last 168 hours. No results for input(s): MMONI in the last 168 hours. CBC: Recent Labs  Lab 08/06/18 1041 08/07/18 0357 08/08/18 0257  WBC 5.6 3.4* 7.6  NEUTROBS 4.2  --   --   HGB 11.4* 10.8* 11.2*  HCT 36.7 34.7* 35.8*  MCV 99.5 100.0 99.7  PLT 229 224 260   Cardiac Enzymes: Recent  Labs  Lab 08/06/18 1041  TROPONINI 0.03*   BNP: BNP (last 3 results) No results for input(s): BNP in the last 8760 hours.  ProBNP (last 3 results) No results for input(s): PROBNP in the last 8760 hours.  CBG: No results for input(s): GLUCP in the last 168 hours.     Signed:  Fayrene Helper MD.  [facility] 08/08/2018, 11:48 M

## 2018-08-08 NOTE — Evaluation (Signed)
Physical Therapy Evaluation Patient Details Name: Donna Curry MRN: [id number] DOB: Aug 07, 1955 Today's Date: 08/08/2018   History of Present Illness  63 yo female admitted  08/06/18 for SOB, HCAP  Clinical Impression  The patient is noted to have HR 130 while ambulating x 170', O2 saturation 100%.  Encouraged pursed lip breaths, patient noted to breathe with noted accessory muscles. Patient requesting to try a quad cane. Will assess safety with Quad cane. Pt admitted with above diagnosis. Pt currently with functional limitations due to the deficits listed below (see PT Problem List).  Pt will benefit from skilled PT to increase their independence and safety with mobility to allow discharge to the venue listed below.       Follow Up Recommendations No PT follow up    Equipment Recommendations  3in1 (PT)(quad cane)    Recommendations for Other Services       Precautions / Restrictions Precautions Precautions: Fall Precaution Comments: monitor sats/ HR Restrictions Weight Bearing Restrictions: No      Mobility  Bed Mobility Overal bed mobility: Independent                Transfers Overall transfer level: Modified independent               General transfer comment: to [facility]  Ambulation/Gait Ambulation/Gait assistance: Supervision;Min guard   Assistive device: Straight cane;Quad cane;IV Pole Gait Pattern/deviations: Step-through pattern;Drifts right/left     General Gait Details: first ambulated x 170' pushing IV pole, sats 100% HR 130, than ambulated x 60' with SPC, still slightly unbalanced. patient requests trying a quad cane.   Stairs            Wheelchair Mobility    Modified Rankin (Stroke Patients Only)       Balance                                             Pertinent Vitals/Pain Pain Assessment: No/denies pain    Home Living Family/patient expects to be discharged to:: Private residence Living Arrangements:  Children;Spouse/significant other Available Help at Discharge: Family Type of Home: House Home Access: Stairs to enter   Technical brewer of Steps: 3 Home Layout: One level Home Equipment: None      Prior Function Level of Independence: Needs assistance   Gait / Transfers Assistance Needed: reports limited ambulation           Hand Dominance        Extremity/Trunk Assessment   Upper Extremity Assessment Upper Extremity Assessment: Generalized weakness    Lower Extremity Assessment Lower Extremity Assessment: Generalized weakness    Cervical / Trunk Assessment Cervical / Trunk Assessment: Normal  Communication   Communication: No difficulties  Cognition Arousal/Alertness: Awake/alert Behavior During Therapy: WFL for tasks assessed/performed Overall Cognitive Status: Within Functional Limits for tasks assessed                                        General Comments      Exercises     Assessment/Plan    PT Assessment Patient needs continued PT services  PT Problem List Decreased strength;Decreased balance;Decreased activity tolerance       PT Treatment Interventions DME instruction;Gait training;Functional mobility training;Therapeutic activities;Patient/family education    PT Goals (Current goals  can be found in the Care Plan section)  Acute Rehab PT Goals Patient Stated Goal: to  walk more PT Goal Formulation: With patient Time For Goal Achievement: 08/15/18 Potential to Achieve Goals: Good    Frequency Min 2X/week   Barriers to discharge        Co-evaluation               AM-PAC PT "6 Clicks" Mobility  Outcome Measure Help needed turning from your back to your side while in a flat bed without using bedrails?: None Help needed moving from lying on your back to sitting on the side of a flat bed without using bedrails?: None Help needed moving to and from a bed to a chair (including a wheelchair)?: None Help needed  standing up from a chair using your arms (e.g., wheelchair or bedside chair)?: A Little Help needed to walk in hospital room?: A Little Help needed climbing 3-5 steps with a railing? : A Little 6 Click Score: 21    End of Session   Activity Tolerance: Patient tolerated treatment well Patient left: in bed;with family/visitor present Nurse Communication: Mobility status PT Visit Diagnosis: Unsteadiness on feet (R26.81)    Time: 9906-8934 PT Time Calculation (min) (ACUTE ONLY): 29 min   Charges:   PT Evaluation $PT Eval Low Complexity: 1 Low PT Treatments $Gait Training: 8-22 mins        Tresa Endo PT Acute Rehabilitation Services Pager [phone] Office [phone]   Claretha Cooper 08/08/2018, 9:23 AM

## 2018-08-08 NOTE — Progress Notes (Signed)
SATURATION QUALIFICATIONS: (This note is used to comply with regulatory documentation for home oxygen)  Patient Saturations on Room Air at Rest = 100%  Patient Saturations on Room Air while Ambulating = 100%  Patient Saturations on  Liters of oxygen while Ambulating = %  Please briefly explain why patient needs home oxygen: Pt does not need home O2

## 2018-08-09 LAB — LEGIONELLA PNEUMOPHILA SEROGP 1 UR AG: L. pneumophila Serogp 1 Ur Ag: NEGATIVE

## 2018-08-11 LAB — CULTURE, BLOOD (ROUTINE X 2)
Culture: NO GROWTH
Culture: NO GROWTH
Special Requests: ADEQUATE
Special Requests: ADEQUATE

## 2018-08-12 DIAGNOSIS — J439 Emphysema, unspecified: Secondary | ICD-10-CM | POA: Diagnosis not present

## 2018-08-12 DIAGNOSIS — Z8551 Personal history of malignant neoplasm of bladder: Secondary | ICD-10-CM | POA: Diagnosis not present

## 2018-08-12 DIAGNOSIS — J9611 Chronic respiratory failure with hypoxia: Secondary | ICD-10-CM | POA: Diagnosis not present

## 2018-08-12 DIAGNOSIS — I429 Cardiomyopathy, unspecified: Secondary | ICD-10-CM | POA: Diagnosis not present

## 2018-08-12 DIAGNOSIS — E43 Unspecified severe protein-calorie malnutrition: Secondary | ICD-10-CM | POA: Diagnosis not present

## 2018-08-12 DIAGNOSIS — E559 Vitamin D deficiency, unspecified: Secondary | ICD-10-CM | POA: Diagnosis not present

## 2018-08-12 DIAGNOSIS — I10 Essential (primary) hypertension: Secondary | ICD-10-CM | POA: Diagnosis not present

## 2018-08-12 DIAGNOSIS — Z8673 Personal history of transient ischemic attack (TIA), and cerebral infarction without residual deficits: Secondary | ICD-10-CM | POA: Diagnosis not present

## 2018-08-12 DIAGNOSIS — Z7951 Long term (current) use of inhaled steroids: Secondary | ICD-10-CM | POA: Diagnosis not present

## 2018-08-12 DIAGNOSIS — M5136 Other intervertebral disc degeneration, lumbar region: Secondary | ICD-10-CM | POA: Diagnosis not present

## 2018-08-12 DIAGNOSIS — Z9181 History of falling: Secondary | ICD-10-CM | POA: Diagnosis not present

## 2018-08-12 DIAGNOSIS — F17210 Nicotine dependence, cigarettes, uncomplicated: Secondary | ICD-10-CM | POA: Diagnosis not present

## 2018-08-12 DIAGNOSIS — J189 Pneumonia, unspecified organism: Secondary | ICD-10-CM | POA: Diagnosis not present

## 2018-08-12 DIAGNOSIS — D500 Iron deficiency anemia secondary to blood loss (chronic): Secondary | ICD-10-CM | POA: Diagnosis not present

## 2018-08-13 ENCOUNTER — Other Ambulatory Visit: Payer: Self-pay

## 2018-08-13 ENCOUNTER — Inpatient Hospital Stay: Payer: Medicare Other | Attending: Hematology & Oncology | Admitting: Hematology & Oncology

## 2018-08-13 ENCOUNTER — Inpatient Hospital Stay: Payer: Medicare Other

## 2018-08-13 VITALS — BP 119/81 | HR 104 | Temp 98.3°F | Resp 17 | Wt 79.0 lb

## 2018-08-13 DIAGNOSIS — E559 Vitamin D deficiency, unspecified: Secondary | ICD-10-CM

## 2018-08-13 DIAGNOSIS — C670 Malignant neoplasm of trigone of bladder: Secondary | ICD-10-CM | POA: Diagnosis not present

## 2018-08-13 DIAGNOSIS — D500 Iron deficiency anemia secondary to blood loss (chronic): Secondary | ICD-10-CM

## 2018-08-13 LAB — CMP (CANCER CENTER ONLY)
ALT: 17 U/L (ref 0–44)
AST: 12 U/L — ABNORMAL LOW (ref 15–41)
Albumin: 3.7 g/dL (ref 3.5–5.0)
Alkaline Phosphatase: 66 U/L (ref 38–126)
Anion gap: 6 (ref 5–15)
BUN: 14 mg/dL (ref 8–23)
CO2: 30 mmol/L (ref 22–32)
Calcium: 9 mg/dL (ref 8.9–10.3)
Chloride: 101 mmol/L (ref 98–111)
Creatinine: 0.84 mg/dL (ref 0.44–1.00)
GFR, Est AFR Am: >60 mL/min (ref >60)
GFR, Estimated: >60 mL/min (ref >60)
Glucose, Bld: 166 mg/dL — ABNORMAL HIGH (ref 70–99)
Potassium: 4 mmol/L (ref 3.5–5.1)
Sodium: 137 mmol/L (ref 135–145)
Total Bilirubin: 0.2 mg/dL — ABNORMAL LOW (ref 0.3–1.2)
Total Protein: 6.1 g/dL — ABNORMAL LOW (ref 6.5–8.1)

## 2018-08-13 LAB — CBC WITH DIFFERENTIAL (CANCER CENTER ONLY)
Abs Immature Granulocytes: 0.05 10*3/uL (ref 0.00–0.07)
Basophils Absolute: 0 10*3/uL (ref 0.0–0.1)
Basophils Relative: 0 %
Eosinophils Absolute: 0 10*3/uL (ref 0.0–0.5)
Eosinophils Relative: 0 %
HCT: 34.1 % — ABNORMAL LOW (ref 36.0–46.0)
Hemoglobin: 10.9 g/dL — ABNORMAL LOW (ref 12.0–15.0)
Immature Granulocytes: 1 %
Lymphocytes Relative: 7 %
Lymphs Abs: 0.5 10*3/uL — ABNORMAL LOW (ref 0.7–4.0)
MCH: 31.5 pg (ref 26.0–34.0)
MCHC: 32 g/dL (ref 30.0–36.0)
MCV: 98.6 fL (ref 80.0–100.0)
Monocytes Absolute: 0.3 10*3/uL (ref 0.1–1.0)
Monocytes Relative: 3 %
Neutro Abs: 6.7 10*3/uL (ref 1.7–7.7)
Neutrophils Relative %: 89 %
Platelet Count: 217 10*3/uL (ref 150–400)
RBC: 3.46 MIL/uL — ABNORMAL LOW (ref 3.87–5.11)
RDW: 14.4 % (ref 11.5–15.5)
WBC Count: 7.5 10*3/uL (ref 4.0–10.5)
nRBC: 0 % (ref 0.0–0.2)

## 2018-08-13 LAB — IRON AND TIBC
Iron: 73 ug/dL (ref 41–142)
Saturation Ratios: 31 % (ref 21–57)
TIBC: 235 ug/dL — ABNORMAL LOW (ref 236–444)
UIBC: 162 ug/dL (ref 120–384)

## 2018-08-13 LAB — FERRITIN: Ferritin: 355 ng/mL — ABNORMAL HIGH (ref 11–307)

## 2018-08-13 MED ORDER — OXYCODONE HCL 5 MG PO TABS: 5.0000 mg | ORAL_TABLET | Freq: Four times a day (QID) | ORAL | 0 refills | Status: DC | PRN

## 2018-08-13 MED ORDER — SODIUM CHLORIDE 0.9% FLUSH
10.0000 mL | Freq: Once | INTRAVENOUS | Status: AC | PRN
  Administered 2018-08-13: 10 mL
  Filled 2018-08-13: qty 10

## 2018-08-13 MED ORDER — HEPARIN SOD (PORK) LOCK FLUSH 100 UNIT/ML IV SOLN
500.0000 [IU] | Freq: Once | INTRAVENOUS | Status: AC | PRN
  Administered 2018-08-13: 500 [IU]
  Filled 2018-08-13: qty 5

## 2018-08-13 NOTE — Patient Instructions (Signed)

## 2018-08-13 NOTE — Progress Notes (Signed)
Hematology and Oncology Follow Up Visit  Donna Curry [id number] 1956/07/06 63 y.o. 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  I actually saw her in the hospital last week.  She apparently was admitted with possible pneumonia.  She still has not yet had a cystoscopy.  It is been 4 months since she completed her chemotherapy and radiation therapy.  She already has had a PET scan.  There is some questionable activity in the bladder.  I am trying to get her to urology for a biopsy to see if there is still active malignancy in the bladder.  She has an appointment on August 17, 2018 to see about the cystoscopy.  I am not sure exactly what happened to her breathing.  She seems to be doing pretty well.  She is not smoking.  She says she is not smoked for 3 weeks.  She has had no fever.  She has had no bleeding.  Her appetite is improving slowly.  She has had no nausea or vomiting.  Overall, her performance status is ECOG 2.    Medications:  Current Outpatient Medications:  .  acetaminophen (TYLENOL) 500 MG tablet, Take 1,000 mg by mouth every 8 (eight) hours as needed for moderate pain., Disp: , Rfl:  .  albuterol (PROVENTIL HFA;VENTOLIN HFA) 108 (90 Base) MCG/ACT inhaler, Inhale 2 puffs into the lungs every 6 (six) hours as needed for wheezing or shortness of breath., Disp: 1 Inhaler, Rfl: 2 .  amLODipine (NORVASC) 10 MG tablet, Take 5 mg by mouth daily., Disp: , Rfl:  .  amoxicillin (AMOXIL) 500 MG capsule, Take 2 capsules (1,000 mg total) by mouth every 8 (eight) hours for 5 days., Disp: 30 capsule, Rfl: 0 .  azithromycin (ZITHROMAX) 250 MG tablet, Take 500 mg today and then 250 mg daily  for the next 4 days, Disp: 6 tablet, Rfl: 0 .  budesonide-formoterol (SYMBICORT) 160-4.5 MCG/ACT inhaler, Inhale 2 puffs into the lungs 2 (two) times daily., Disp: 1 Inhaler, Rfl: 1 .  budesonide-formoterol (SYMBICORT) 160-4.5 MCG/ACT inhaler, , Disp: , Rfl:  .  docusate sodium (COLACE) 100 MG capsule, Take 1 capsule (100 mg total) by mouth 2 (two) times daily., Disp: 10 capsule, Rfl: 0 .  Doxepin HCl (SILENOR) 3 MG TABS, Take 1 tablet (3 mg total) by mouth at bedtime., Disp: 30 tablet, Rfl: 2 .  Ipratropium-Albuterol (COMBIVENT) 20-100 MCG/ACT AERS respimat, Inhale 1 puff into the lungs every 6 (six) hours., Disp: 1 Inhaler, Rfl: 1 .  lactulose (CHRONULAC) 10 GM/15ML solution, Take 15 mLs (10 g total) by mouth 2 (two) times daily as needed for mild constipation or moderate constipation. (Patient not taking: Reported on 08/06/2018), Disp: 473 mL, Rfl: 1 .  lidocaine-prilocaine (EMLA) cream, Apply to affected area once, Disp: 30 g, Rfl: 3 .  LORazepam (ATIVAN) 0.5 MG tablet, Take 1 tablet (0.5 mg total) by mouth every 6 (six) hours as needed for anxiety (Nausea or vomiting). (Patient not taking: Reported on 08/06/2018), Disp: 30 tablet, Rfl: 0 .  MARINOL 2.5 MG capsule, TAKE 1 CAPSULE BY MOUTH TWICE DAILY BEFORE A MEAL (Patient taking differently: Take 2.5 mg by mouth daily before lunch. ), Disp: 60 capsule,  Rfl: 0 .  megestrol (MEGACE ES) 625 MG/5ML suspension, Take 5 mLs (625 mg total) by mouth daily. (Patient not taking: Reported on 08/06/2018), Disp: 150 mL, Rfl: 3 .  nicotine (NICODERM CQ - DOSED IN MG/24 HOURS) 14 mg/24hr patch, Place 1 patch (14 mg total) onto the skin daily., Disp: 28 patch, Rfl: 0 .  ondansetron (ZOFRAN) 8 MG tablet, Take 1 tablet (8 mg total) by mouth 2 (two) times daily as needed. Start on the third day after chemotherapy. (Patient not taking: Reported on 08/06/2018), Disp: 30 tablet, Rfl: 1 .  oxandrolone (OXANDRIN) 10 MG tablet, Take 1 tablet (10 mg total) by mouth daily.  (Patient not taking: Reported on 08/06/2018), Disp: 30 tablet, Rfl: 3 .  oxyCODONE (OXY IR/ROXICODONE) 5 MG immediate release tablet, Take 1 tablet (5 mg total) by mouth every 6 (six) hours as needed for severe pain., Disp: 60 tablet, Rfl: 0 .  polyethylene glycol (MIRALAX / GLYCOLAX) packet, Take 17 g by mouth daily as needed for mild constipation., Disp: , Rfl:  .  predniSONE (DELTASONE) 20 MG tablet, Take 2 tablets (40 mg total) by mouth daily with breakfast for 5 days., Disp: 10 tablet, Rfl: 0 .  prochlorperazine (COMPAZINE) 10 MG tablet, Take 1 tablet (10 mg total) by mouth every 6 (six) hours as needed (Nausea or vomiting)., Disp: 30 tablet, Rfl: 1 .  varenicline (CHANTIX PAK) 0.5 MG X 11 & 1 MG X 42 tablet, Take one 0.5 mg tablet daily x 3 days, then increase to one 0.5 mg tab twice daily x 4 days, then increase to one 1 mg tablet twice daily., Disp: 53 tablet, Rfl: 0  Allergies: No Known Allergies  Past Medical History, Surgical history, Social history, and Family History were reviewed and updated.  Review of Systems: Review of Systems  Constitutional: Negative.   HENT:  Negative.   Eyes: Negative.   Respiratory: Negative.   Cardiovascular: Negative.   Gastrointestinal: Negative.   Endocrine: Negative.   Genitourinary: Negative.    Musculoskeletal: Negative.   Skin: Negative.   Neurological: Negative.   Hematological: Negative.   Psychiatric/Behavioral: Negative.     Physical Exam:  weight is 79 lb (35.8 kg). Her oral temperature is 98.3 F (36.8 C). Her blood pressure is 119/81 and her pulse is 104 (abnormal). Her respiration is 17 and oxygen saturation is 100%.   Wt Readings from Last 3 Encounters:  08/13/18 79 lb (35.8 kg)  08/06/18 78 lb (35.4 kg)  06/29/18 78 lb 12.8 oz (35.7 kg)    Physical Exam Vitals signs reviewed.  HENT:     Head: Normocephalic and atraumatic.  Eyes:     Pupils: Pupils are equal, round, and reactive to light.  Neck:     Musculoskeletal:  Normal range of motion.  Cardiovascular:     Rate and Rhythm: Normal rate and regular rhythm.     Heart sounds: Normal heart sounds.  Pulmonary:     Effort: Pulmonary effort is normal.     Breath sounds: Normal breath sounds.  Abdominal:     General: Bowel sounds are normal.     Palpations: Abdomen is soft.     Comments: Her abdominal exam is soft.  She has decent bowel sounds.  There might be some fullness in the hypoumbilical area.  She has no fluid wave.  There is no palpable liver or spleen tip.  Musculoskeletal: Normal range of motion.        General: No tenderness or deformity.  Lymphadenopathy:     Cervical: No cervical adenopathy.  Skin:    General: Skin is warm and dry.     Findings: No erythema or rash.  Neurological:     Mental Status: She is alert and oriented to person, place, and time.  Psychiatric:        Behavior: Behavior normal.        Thought Content: Thought content normal.        Judgment: Judgment normal.      Lab Results  Component Value Date   WBC 7.5 08/13/2018   HGB 10.9 (L) 08/13/2018   HCT 34.1 (L) 08/13/2018   MCV 98.6 08/13/2018   PLT 217 08/13/2018     Chemistry      Component Value Date/Time   NA 143 08/08/2018 0257   K 4.0 08/08/2018 0257   CL 102 08/08/2018 0257   CO2 31 08/08/2018 0257   BUN 14 08/08/2018 0257   CREATININE 0.83 08/08/2018 0257   CREATININE 0.95 07/17/2018 0902   CREATININE 1.10 08/20/2013 1027      Component Value Date/Time   CALCIUM 8.9 08/08/2018 0257   ALKPHOS 74 08/07/2018 0357   AST 28 08/07/2018 0357   AST 12 (L) 07/17/2018 0902   ALT 21 08/07/2018 0357   ALT 7 07/17/2018 0902   BILITOT 0.3 08/07/2018 0357   BILITOT 0.3 07/17/2018 0902         Impression and Plan: Ms. Sobocinski is a 63 year old African-American female.  She has locally advanced high-grade urothelial carcinoma of the bladder.  I really believe that the cystoscopy is going to be incredibly important for [place].  I really hope that we  will find that she does not have any active disease in the bladder.  If she still has disease in the bladder, I am not sure what chemotherapy we could consider.  I am sure that if she has biopsies done, then we can send these off for molecular analysis.  I would like to see her back in about a month.  I figure that by then, we should have the results back from the cystoscopy and biopsy.  Of note, she came in with a sister today.     [name], MD. 1/20/202011:39 AM

## 2018-08-14 ENCOUNTER — Telehealth: Payer: Self-pay | Admitting: *Deleted

## 2018-08-14 ENCOUNTER — Other Ambulatory Visit: Payer: Self-pay | Admitting: *Deleted

## 2018-08-14 LAB — VITAMIN D 25 HYDROXY (VIT D DEFICIENCY, FRACTURES): Vit D, 25-Hydroxy: 11.8 ng/mL — ABNORMAL LOW (ref 30.0–100.0)

## 2018-08-14 MED ORDER — ERGOCALCIFEROL 1.25 MG (50000 UT) PO CAPS: 50000.0000 [IU] | ORAL_CAPSULE | ORAL | 2 refills | Status: DC

## 2018-08-14 NOTE — Telephone Encounter (Signed)
-----   Message from [name], MD sent at 08/14/2018 12:18 PM EST ----- Call - Vit D is very low!! She needs 50000 units q wk.  Please call this in for her!!  Thanks!  Laurey Arrow

## 2018-08-14 NOTE — Telephone Encounter (Signed)
Patient notified per order of [name] that Vitamin D is very low and that she will need to start Vitamin D 50000 units q week.  Pt requests that script be sent to [facility] on [address] in [place].  Pt verbalizes an understanding of taking Vitamin D weekly and has no questions or concerns at this time.

## 2018-08-23 DIAGNOSIS — J9611 Chronic respiratory failure with hypoxia: Secondary | ICD-10-CM | POA: Diagnosis not present

## 2018-08-23 DIAGNOSIS — J439 Emphysema, unspecified: Secondary | ICD-10-CM | POA: Diagnosis not present

## 2018-08-23 DIAGNOSIS — M5136 Other intervertebral disc degeneration, lumbar region: Secondary | ICD-10-CM | POA: Diagnosis not present

## 2018-08-23 DIAGNOSIS — E43 Unspecified severe protein-calorie malnutrition: Secondary | ICD-10-CM | POA: Diagnosis not present

## 2018-08-23 DIAGNOSIS — I10 Essential (primary) hypertension: Secondary | ICD-10-CM | POA: Diagnosis not present

## 2018-08-23 DIAGNOSIS — J189 Pneumonia, unspecified organism: Secondary | ICD-10-CM | POA: Diagnosis not present

## 2018-08-24 DIAGNOSIS — I10 Essential (primary) hypertension: Secondary | ICD-10-CM | POA: Diagnosis not present

## 2018-08-24 DIAGNOSIS — J439 Emphysema, unspecified: Secondary | ICD-10-CM | POA: Diagnosis not present

## 2018-08-24 DIAGNOSIS — J9611 Chronic respiratory failure with hypoxia: Secondary | ICD-10-CM | POA: Diagnosis not present

## 2018-08-24 DIAGNOSIS — M5136 Other intervertebral disc degeneration, lumbar region: Secondary | ICD-10-CM | POA: Diagnosis not present

## 2018-08-24 DIAGNOSIS — E43 Unspecified severe protein-calorie malnutrition: Secondary | ICD-10-CM | POA: Diagnosis not present

## 2018-08-24 DIAGNOSIS — J189 Pneumonia, unspecified organism: Secondary | ICD-10-CM | POA: Diagnosis not present

## 2018-08-25 DIAGNOSIS — J189 Pneumonia, unspecified organism: Secondary | ICD-10-CM | POA: Diagnosis not present

## 2018-08-25 DIAGNOSIS — J9611 Chronic respiratory failure with hypoxia: Secondary | ICD-10-CM | POA: Diagnosis not present

## 2018-08-25 DIAGNOSIS — E43 Unspecified severe protein-calorie malnutrition: Secondary | ICD-10-CM | POA: Diagnosis not present

## 2018-08-25 DIAGNOSIS — J439 Emphysema, unspecified: Secondary | ICD-10-CM | POA: Diagnosis not present

## 2018-08-25 DIAGNOSIS — I10 Essential (primary) hypertension: Secondary | ICD-10-CM | POA: Diagnosis not present

## 2018-08-25 DIAGNOSIS — M5136 Other intervertebral disc degeneration, lumbar region: Secondary | ICD-10-CM | POA: Diagnosis not present

## 2018-08-27 DIAGNOSIS — J439 Emphysema, unspecified: Secondary | ICD-10-CM | POA: Diagnosis not present

## 2018-08-27 DIAGNOSIS — M5136 Other intervertebral disc degeneration, lumbar region: Secondary | ICD-10-CM | POA: Diagnosis not present

## 2018-08-27 DIAGNOSIS — J189 Pneumonia, unspecified organism: Secondary | ICD-10-CM | POA: Diagnosis not present

## 2018-08-27 DIAGNOSIS — I10 Essential (primary) hypertension: Secondary | ICD-10-CM | POA: Diagnosis not present

## 2018-08-27 DIAGNOSIS — E43 Unspecified severe protein-calorie malnutrition: Secondary | ICD-10-CM | POA: Diagnosis not present

## 2018-08-27 DIAGNOSIS — J9611 Chronic respiratory failure with hypoxia: Secondary | ICD-10-CM | POA: Diagnosis not present

## 2018-08-31 ENCOUNTER — Emergency Department (HOSPITAL_BASED_OUTPATIENT_CLINIC_OR_DEPARTMENT_OTHER): Payer: Medicare Other

## 2018-08-31 ENCOUNTER — Inpatient Hospital Stay (HOSPITAL_BASED_OUTPATIENT_CLINIC_OR_DEPARTMENT_OTHER)
Admission: EM | Admit: 2018-08-31 | Discharge: 2018-09-03 | DRG: 189 | Disposition: A | Discharge status: Home Health | Payer: Medicare Other | Attending: Family Medicine | Admitting: Family Medicine

## 2018-08-31 ENCOUNTER — Encounter (HOSPITAL_BASED_OUTPATIENT_CLINIC_OR_DEPARTMENT_OTHER): Payer: Self-pay | Admitting: *Deleted

## 2018-08-31 ENCOUNTER — Other Ambulatory Visit: Payer: Self-pay

## 2018-08-31 DIAGNOSIS — I43 Cardiomyopathy in diseases classified elsewhere: Secondary | ICD-10-CM | POA: Diagnosis not present

## 2018-08-31 DIAGNOSIS — J441 Chronic obstructive pulmonary disease with (acute) exacerbation: Secondary | ICD-10-CM

## 2018-08-31 DIAGNOSIS — Z681 Body mass index (BMI) 19 or less, adult: Secondary | ICD-10-CM | POA: Diagnosis not present

## 2018-08-31 DIAGNOSIS — C679 Malignant neoplasm of bladder, unspecified: Secondary | ICD-10-CM | POA: Diagnosis present

## 2018-08-31 DIAGNOSIS — J9601 Acute respiratory failure with hypoxia: Secondary | ICD-10-CM | POA: Diagnosis not present

## 2018-08-31 DIAGNOSIS — R0603 Acute respiratory distress: Secondary | ICD-10-CM | POA: Diagnosis present

## 2018-08-31 DIAGNOSIS — Z79899 Other long term (current) drug therapy: Secondary | ICD-10-CM

## 2018-08-31 DIAGNOSIS — Z8701 Personal history of pneumonia (recurrent): Secondary | ICD-10-CM

## 2018-08-31 DIAGNOSIS — Z713 Dietary counseling and surveillance: Secondary | ICD-10-CM | POA: Diagnosis not present

## 2018-08-31 DIAGNOSIS — N130 Hydronephrosis with ureteropelvic junction obstruction: Secondary | ICD-10-CM | POA: Diagnosis not present

## 2018-08-31 DIAGNOSIS — D630 Anemia in neoplastic disease: Secondary | ICD-10-CM | POA: Diagnosis present

## 2018-08-31 DIAGNOSIS — E43 Unspecified severe protein-calorie malnutrition: Secondary | ICD-10-CM | POA: Diagnosis not present

## 2018-08-31 DIAGNOSIS — J432 Centrilobular emphysema: Secondary | ICD-10-CM | POA: Diagnosis not present

## 2018-08-31 DIAGNOSIS — J439 Emphysema, unspecified: Secondary | ICD-10-CM | POA: Diagnosis not present

## 2018-08-31 DIAGNOSIS — D500 Iron deficiency anemia secondary to blood loss (chronic): Secondary | ICD-10-CM | POA: Diagnosis not present

## 2018-08-31 DIAGNOSIS — I10 Essential (primary) hypertension: Secondary | ICD-10-CM | POA: Diagnosis present

## 2018-08-31 DIAGNOSIS — Z8673 Personal history of transient ischemic attack (TIA), and cerebral infarction without residual deficits: Secondary | ICD-10-CM

## 2018-08-31 DIAGNOSIS — I119 Hypertensive heart disease without heart failure: Secondary | ICD-10-CM | POA: Diagnosis present

## 2018-08-31 DIAGNOSIS — R05 Cough: Secondary | ICD-10-CM | POA: Diagnosis not present

## 2018-08-31 DIAGNOSIS — N390 Urinary tract infection, site not specified: Secondary | ICD-10-CM | POA: Diagnosis not present

## 2018-08-31 DIAGNOSIS — Z8249 Family history of ischemic heart disease and other diseases of the circulatory system: Secondary | ICD-10-CM

## 2018-08-31 DIAGNOSIS — C678 Malignant neoplasm of overlapping sites of bladder: Secondary | ICD-10-CM | POA: Diagnosis not present

## 2018-08-31 DIAGNOSIS — J9621 Acute and chronic respiratory failure with hypoxia: Principal | ICD-10-CM | POA: Diagnosis present

## 2018-08-31 DIAGNOSIS — Z87891 Personal history of nicotine dependence: Secondary | ICD-10-CM | POA: Diagnosis not present

## 2018-08-31 DIAGNOSIS — Z923 Personal history of irradiation: Secondary | ICD-10-CM

## 2018-08-31 DIAGNOSIS — Z9221 Personal history of antineoplastic chemotherapy: Secondary | ICD-10-CM | POA: Diagnosis not present

## 2018-08-31 DIAGNOSIS — Z87442 Personal history of urinary calculi: Secondary | ICD-10-CM | POA: Diagnosis not present

## 2018-08-31 DIAGNOSIS — R0602 Shortness of breath: Secondary | ICD-10-CM | POA: Diagnosis not present

## 2018-08-31 DIAGNOSIS — R000 Tachycardia, unspecified: Secondary | ICD-10-CM | POA: Diagnosis not present

## 2018-08-31 DIAGNOSIS — Z833 Family history of diabetes mellitus: Secondary | ICD-10-CM

## 2018-08-31 LAB — BASIC METABOLIC PANEL
Anion gap: 10 (ref 5–15)
BUN: 11 mg/dL (ref 8–23)
CO2: 25 mmol/L (ref 22–32)
Calcium: 9.5 mg/dL (ref 8.9–10.3)
Chloride: 105 mmol/L (ref 98–111)
Creatinine, Ser: 0.72 mg/dL (ref 0.44–1.00)
GFR calc Af Amer: >60 mL/min (ref >60)
GFR calc non Af Amer: >60 mL/min (ref >60)
Glucose, Bld: 107 mg/dL — ABNORMAL HIGH (ref 70–99)
Potassium: 4.3 mmol/L (ref 3.5–5.1)
Sodium: 140 mmol/L (ref 135–145)

## 2018-08-31 LAB — CBC
HCT: 32.6 % — ABNORMAL LOW (ref 36.0–46.0)
Hemoglobin: 10.3 g/dL — ABNORMAL LOW (ref 12.0–15.0)
MCH: 31 pg (ref 26.0–34.0)
MCHC: 31.6 g/dL (ref 30.0–36.0)
MCV: 98.2 fL (ref 80.0–100.0)
Platelets: 168 10*3/uL (ref 150–400)
RBC: 3.32 MIL/uL — ABNORMAL LOW (ref 3.87–5.11)
RDW: 14.1 % (ref 11.5–15.5)
WBC: 5 10*3/uL (ref 4.0–10.5)
nRBC: 0 % (ref 0.0–0.2)

## 2018-08-31 LAB — CBC WITH DIFFERENTIAL/PLATELET
Abs Immature Granulocytes: 0.01 10*3/uL (ref 0.00–0.07)
Basophils Absolute: 0 10*3/uL (ref 0.0–0.1)
Basophils Relative: 0 %
Eosinophils Absolute: 0 10*3/uL (ref 0.0–0.5)
Eosinophils Relative: 1 %
HCT: 41.4 % (ref 36.0–46.0)
Hemoglobin: 12.8 g/dL (ref 12.0–15.0)
Immature Granulocytes: 0 %
Lymphocytes Relative: 31 %
Lymphs Abs: 2 10*3/uL (ref 0.7–4.0)
MCH: 30.3 pg (ref 26.0–34.0)
MCHC: 30.9 g/dL (ref 30.0–36.0)
MCV: 97.9 fL (ref 80.0–100.0)
Monocytes Absolute: 0.8 10*3/uL (ref 0.1–1.0)
Monocytes Relative: 12 %
Neutro Abs: 3.5 10*3/uL (ref 1.7–7.7)
Neutrophils Relative %: 56 %
Platelets: 206 10*3/uL (ref 150–400)
RBC: 4.23 MIL/uL (ref 3.87–5.11)
RDW: 13.9 % (ref 11.5–15.5)
WBC: 6.3 10*3/uL (ref 4.0–10.5)
nRBC: 0 % (ref 0.0–0.2)

## 2018-08-31 LAB — TROPONIN I
Troponin I: <0.03 ng/mL (ref <0.03)
Troponin I: 0.03 ng/mL (ref <0.03)

## 2018-08-31 LAB — CREATININE, SERUM
Creatinine, Ser: 0.97 mg/dL (ref 0.44–1.00)
GFR calc Af Amer: >60 mL/min (ref >60)
GFR calc non Af Amer: >60 mL/min (ref >60)

## 2018-08-31 LAB — MAGNESIUM: Magnesium: 2.5 mg/dL — ABNORMAL HIGH (ref 1.7–2.4)

## 2018-08-31 MED ORDER — LEVALBUTEROL HCL 1.25 MG/0.5ML IN NEBU: 1.2500 mg | INHALATION_SOLUTION | Freq: Four times a day (QID) | RESPIRATORY_TRACT | Status: DC | PRN

## 2018-08-31 MED ORDER — ACETAMINOPHEN 650 MG RE SUPP: 650.0000 mg | Freq: Four times a day (QID) | RECTAL | Status: DC | PRN

## 2018-08-31 MED ORDER — BUDESONIDE 0.25 MG/2ML IN SUSP
0.2500 mg | Freq: Two times a day (BID) | RESPIRATORY_TRACT | Status: DC
  Administered 2018-09-01 – 2018-09-03 (×5): 0.25 mg via RESPIRATORY_TRACT
  Filled 2018-08-31 (×5): qty 2

## 2018-08-31 MED ORDER — IPRATROPIUM BROMIDE 0.02 % IN SOLN
0.5000 mg | Freq: Four times a day (QID) | RESPIRATORY_TRACT | Status: DC
  Administered 2018-09-01 – 2018-09-03 (×10): 0.5 mg via RESPIRATORY_TRACT
  Filled 2018-08-31 (×10): qty 2.5

## 2018-08-31 MED ORDER — LEVALBUTEROL HCL 0.63 MG/3ML IN NEBU: 0.6300 mg | INHALATION_SOLUTION | Freq: Four times a day (QID) | RESPIRATORY_TRACT | Status: DC | PRN

## 2018-08-31 MED ORDER — DOCUSATE SODIUM 100 MG PO CAPS
100.0000 mg | ORAL_CAPSULE | Freq: Two times a day (BID) | ORAL | Status: DC
  Administered 2018-08-31 – 2018-09-03 (×6): 100 mg via ORAL
  Filled 2018-08-31 (×6): qty 1

## 2018-08-31 MED ORDER — IPRATROPIUM BROMIDE 0.02 % IN SOLN
0.5000 mg | Freq: Once | RESPIRATORY_TRACT | Status: AC
  Administered 2018-08-31: 0.5 mg via RESPIRATORY_TRACT
  Filled 2018-08-31: qty 2.5

## 2018-08-31 MED ORDER — ALBUTEROL SULFATE (2.5 MG/3ML) 0.083% IN NEBU
INHALATION_SOLUTION | RESPIRATORY_TRACT | Status: AC
  Filled 2018-08-31: qty 6

## 2018-08-31 MED ORDER — ACETAMINOPHEN 325 MG PO TABS: 650.0000 mg | ORAL_TABLET | Freq: Four times a day (QID) | ORAL | Status: DC | PRN

## 2018-08-31 MED ORDER — AMLODIPINE BESYLATE 5 MG PO TABS
5.0000 mg | ORAL_TABLET | Freq: Every day | ORAL | Status: DC
  Administered 2018-09-01 – 2018-09-03 (×3): 5 mg via ORAL
  Filled 2018-08-31 (×3): qty 1

## 2018-08-31 MED ORDER — OXYCODONE HCL 5 MG PO TABS
5.0000 mg | ORAL_TABLET | Freq: Four times a day (QID) | ORAL | Status: DC | PRN
  Administered 2018-09-01 – 2018-09-03 (×6): 5 mg via ORAL
  Filled 2018-08-31 (×6): qty 1

## 2018-08-31 MED ORDER — PREDNISONE 50 MG PO TABS
60.0000 mg | ORAL_TABLET | Freq: Once | ORAL | Status: AC
  Administered 2018-08-31: 60 mg via ORAL
  Filled 2018-08-31: qty 1

## 2018-08-31 MED ORDER — ONDANSETRON HCL 4 MG/2ML IJ SOLN: 4.0000 mg | Freq: Four times a day (QID) | INTRAMUSCULAR | Status: DC | PRN

## 2018-08-31 MED ORDER — DRONABINOL 2.5 MG PO CAPS
2.5000 mg | ORAL_CAPSULE | Freq: Every morning | ORAL | Status: DC
  Administered 2018-09-01 – 2018-09-03 (×3): 2.5 mg via ORAL
  Filled 2018-08-31 (×3): qty 1

## 2018-08-31 MED ORDER — IPRATROPIUM-ALBUTEROL 0.5-2.5 (3) MG/3ML IN SOLN
3.0000 mL | Freq: Four times a day (QID) | RESPIRATORY_TRACT | Status: DC
  Administered 2018-08-31: 3 mL via RESPIRATORY_TRACT
  Filled 2018-08-31: qty 3

## 2018-08-31 MED ORDER — SODIUM CHLORIDE 0.9% FLUSH
10.0000 mL | INTRAVENOUS | Status: DC | PRN
  Administered 2018-09-01: 10 mL
  Filled 2018-08-31: qty 40

## 2018-08-31 MED ORDER — ALBUTEROL (5 MG/ML) CONTINUOUS INHALATION SOLN
10.0000 mg/h | INHALATION_SOLUTION | Freq: Once | RESPIRATORY_TRACT | Status: AC
  Administered 2018-08-31: 10 mg/h via RESPIRATORY_TRACT
  Filled 2018-08-31: qty 20

## 2018-08-31 MED ORDER — LEVALBUTEROL HCL 1.25 MG/0.5ML IN NEBU
1.2500 mg | INHALATION_SOLUTION | Freq: Once | RESPIRATORY_TRACT | Status: AC
  Administered 2018-08-31: 1.25 mg via RESPIRATORY_TRACT

## 2018-08-31 MED ORDER — IPRATROPIUM BROMIDE 0.02 % IN SOLN: 0.5000 mg | Freq: Four times a day (QID) | RESPIRATORY_TRACT | Status: DC

## 2018-08-31 MED ORDER — ENOXAPARIN SODIUM 300 MG/3ML IJ SOLN
20.0000 mg | INTRAMUSCULAR | Status: DC
  Administered 2018-08-31 – 2018-09-02 (×3): 20 mg via SUBCUTANEOUS
  Filled 2018-08-31 (×4): qty 0.2

## 2018-08-31 MED ORDER — NICOTINE 14 MG/24HR TD PT24
14.0000 mg | MEDICATED_PATCH | Freq: Every day | TRANSDERMAL | Status: DC
  Administered 2018-09-01 – 2018-09-03 (×3): 14 mg via TRANSDERMAL
  Filled 2018-08-31 (×3): qty 1

## 2018-08-31 MED ORDER — MAGNESIUM SULFATE 2 GM/50ML IV SOLN
2.0000 g | Freq: Once | INTRAVENOUS | Status: AC
  Administered 2018-08-31: 2 g via INTRAVENOUS
  Filled 2018-08-31: qty 50

## 2018-08-31 MED ORDER — LEVALBUTEROL HCL 0.63 MG/3ML IN NEBU: 0.6300 mg | INHALATION_SOLUTION | Freq: Once | RESPIRATORY_TRACT | Status: DC

## 2018-08-31 MED ORDER — ONDANSETRON HCL 4 MG PO TABS: 4.0000 mg | ORAL_TABLET | Freq: Four times a day (QID) | ORAL | Status: DC | PRN

## 2018-08-31 MED ORDER — LEVALBUTEROL HCL 1.25 MG/0.5ML IN NEBU
1.2500 mg | INHALATION_SOLUTION | Freq: Four times a day (QID) | RESPIRATORY_TRACT | Status: DC
  Administered 2018-09-01 – 2018-09-03 (×10): 1.25 mg via RESPIRATORY_TRACT
  Filled 2018-08-31 (×10): qty 0.5

## 2018-08-31 MED ORDER — LEVALBUTEROL HCL 1.25 MG/0.5ML IN NEBU
1.2500 mg | INHALATION_SOLUTION | Freq: Four times a day (QID) | RESPIRATORY_TRACT | Status: DC
  Administered 2018-08-31: 1.25 mg via RESPIRATORY_TRACT
  Filled 2018-08-31: qty 0.5

## 2018-08-31 MED ORDER — LEVALBUTEROL HCL 1.25 MG/0.5ML IN NEBU: 1.2500 mg | INHALATION_SOLUTION | Freq: Four times a day (QID) | RESPIRATORY_TRACT | Status: DC

## 2018-08-31 MED ORDER — ALBUTEROL SULFATE (2.5 MG/3ML) 0.083% IN NEBU
5.0000 mg | INHALATION_SOLUTION | Freq: Once | RESPIRATORY_TRACT | Status: AC
  Administered 2018-08-31: 5 mg via RESPIRATORY_TRACT
  Filled 2018-08-31: qty 6

## 2018-08-31 MED ORDER — LEVALBUTEROL HCL 1.25 MG/0.5ML IN NEBU
INHALATION_SOLUTION | RESPIRATORY_TRACT | Status: AC
  Administered 2018-08-31: 1.25 mg via RESPIRATORY_TRACT
  Filled 2018-08-31: qty 0.5

## 2018-08-31 MED ORDER — IOPAMIDOL (ISOVUE-370) INJECTION 76%
100.0000 mL | Freq: Once | INTRAVENOUS | Status: AC | PRN
  Administered 2018-08-31: 60 mL via INTRAVENOUS

## 2018-08-31 NOTE — ED Notes (Signed)
Patient became anxious and restless stating that she can not breath. Os sats in the low 80's, NRB started but patient kept taking it off.  RN called for assistance; MD and staff came to the room to assist.  MD kept talking to the patient and at the same time holding the NRB to her face, patient started to calm down.  RT is also at the bedside.

## 2018-08-31 NOTE — Progress Notes (Signed)
Provider contacted, V.O received regular diet

## 2018-08-31 NOTE — ED Notes (Signed)
Patient to and from CT on 100% NRB.  Tolerated will VS updated before continuous neb started .

## 2018-08-31 NOTE — ED Triage Notes (Signed)
Sob for a week. Hx of cancer but she does not what kind. She just left the cancer center.

## 2018-08-31 NOTE — ED Provider Notes (Signed)
I received the patient in signout from Dr. Maryan Rued, briefly the patient is a 63 year old female with a significant past medical history of COPD who came in with a chief complaint of shortness of breath.  While she was here she had a event where she her oxygen saturation dropped into the 80s and her heart rate into the 150s and she had significant work of breathing.  She has a remote history of bladder cancer and there is some concern that maybe it has reoccurred.  She was just admitted a month ago with a similar presentation, CT scan at that time was negative no at this point with the patient having cancer and shortness of breath not improving with breathing treatments plan is for a CT angiogram of the chest to evaluate for pulmonary embolism.  Patient is feeling a bit better, she was requiring oxygen.  Currently is on a continuous albuterol treatment.  This is negative for pneumonia or PE.  Will discuss with the hospitalist for admission.   Deno Etienne, DO 08/31/18 Mirian Capuchin

## 2018-08-31 NOTE — H&P (Signed)
History and Physical    Donna Curry:[id number] DOB: 1956/05/10 DOA: 08/31/2018  PCP: [name], NP  Patient coming from: Home.  Chief Complaint: Shortness of breath.  HPI: Donna Curry is a 63 y.o. female with history of bladder cancer being followed by [name] had a cystoscopy done this morning and since patient became more short of breath presented to the ER admits in [facility].  Patient was admitted about 3 weeks ago for healthcare associated pneumonia.  Patient states since discharge she did well but last few days been getting more short of breath with wheezing and some cough.  Denies any chest pain fever chills.  ED Course: In the ER patient was found to be wheezing and had brief episodes of tachycardia.  EKG was showing nonspecific ST-T changes comparable to the old EKG.  CT angiogram did not show anything acute.  Patient admitted for acute exacerbation of COPD.  Review of Systems: As per HPI, rest all negative.   Past Medical History:  Diagnosis Date  . Anemia   . Family history of adverse reaction to anesthesia    brother wakes up and dont know who he is and sister has n/v  . Goals of care, counseling/discussion 02/09/2018  . History of kidney stones   . Hypertension   . Iron deficiency anemia due to chronic blood loss 03/01/2018  . Measles as a child  . Mumps as a child  . TIA (transient ischemic attack)   . Tobacco abuse     Past Surgical History:  Procedure Laterality Date  . CYSTOSCOPY W/ URETERAL STENT PLACEMENT Right 12/13/2017   Procedure: CYSTOSCOPY WITH RIGHT RETROGRADE PYELOGRAM/URETERAL RIGHT STENT PLACEMENT;  Surgeon: [name], MD;  Location: [facility];  Service: Urology;  Laterality: Right;  . IR IMAGING GUIDED PORT INSERTION  02/23/2018  . TRANSURETHRAL RESECTION OF BLADDER TUMOR N/A 12/13/2017   Procedure: TRANSURETHRAL RESECTION OF BLADDER TUMOR (TURBT);  Surgeon: [name], MD;  Location: [facility];  Service: Urology;   Laterality: N/A;  . TRANSURETHRAL RESECTION OF BLADDER TUMOR WITH GYRUS (TURBT-GYRUS)     [name] 12-13-17  . tubes tied    . uterine ablation     about 2005     reports that she has been smoking cigarettes. She has been smoking about 0.50 packs per day. She has never used smokeless tobacco. She reports current drug use. She reports that she does not drink alcohol.  No Known Allergies  Family History  Problem Relation Age of Onset  . Hypertension Mother   . Diabetes Father   . Hypertension Brother   . HIV Brother     Prior to Admission medications   Medication Sig Start Date End Date Taking? Authorizing Provider  acetaminophen (TYLENOL) 500 MG tablet Take 1,000 mg by mouth every 8 (eight) hours as needed for moderate pain.   Yes [provider]  albuterol (PROVENTIL HFA;VENTOLIN HFA) 108 (90 Base) MCG/ACT inhaler Inhale 2 puffs into the lungs every 6 (six) hours as needed for wheezing or shortness of breath. 08/08/18  Yes [name], MD  amLODipine (NORVASC) 10 MG tablet Take 5 mg by mouth daily.   Yes [provider]  aspirin EC 81 MG tablet Take 81 mg by mouth daily as needed (with blood pressure medication, as directed.).   Yes [provider]  budesonide-formoterol (SYMBICORT) 160-4.5 MCG/ACT inhaler Inhale 2 puffs into the lungs 2 (two) times daily. 08/08/18  Yes [name], MD  docusate sodium (COLACE) 100 MG capsule Take 1 capsule (100 mg total) by mouth 2 (two) times daily. 12/14/17  Yes [name], MD  ergocalciferol (VITAMIN D2) 1.25 MG (50000 UT) capsule Take 1 capsule (50,000 Units total) by mouth once a week. 08/14/18  Yes [name], [name], MD  Ipratropium-Albuterol (COMBIVENT) 20-100 MCG/ACT AERS respimat Inhale 1 puff into the lungs every 6 (six) hours. 08/08/18  Yes [name], MD  lidocaine-prilocaine (EMLA) cream Apply to affected area once Patient taking differently: Apply 1 application topically as  needed. To port, prior to access. 03/01/18  Yes [name], [name], MD  MARINOL 2.5 MG capsule TAKE 1 CAPSULE BY MOUTH TWICE DAILY BEFORE A MEAL Patient taking differently: Take 2.5 mg by mouth every morning.  02/28/18  Yes [name], [name], MD  nicotine (NICODERM CQ - DOSED IN MG/24 HOURS) 14 mg/24hr patch Place 1 patch (14 mg total) onto the skin daily. 08/09/18  Yes [name], MD  ondansetron (ZOFRAN) 8 MG tablet Take 1 tablet (8 mg total) by mouth 2 (two) times daily as needed. Start on the third day after chemotherapy. Patient taking differently: Take 8 mg by mouth 2 (two) times daily as needed for nausea or vomiting. Start on the third day after chemotherapy. 03/01/18  Yes [name], MD  oxyCODONE (OXY IR/ROXICODONE) 5 MG immediate release tablet Take 1 tablet (5 mg total) by mouth every 6 (six) hours as needed for severe pain. 08/13/18  Yes [name], MD  megestrol (MEGACE ES) 625 MG/5ML suspension Take 5 mLs (625 mg total) by mouth daily. Patient not taking: Reported on 08/06/2018 12/28/17   [name], MD  varenicline (CHANTIX PAK) 0.5 MG X 11 & 1 MG X 42 tablet Take one 0.5 mg tablet daily x 3 days, then increase to one 0.5 mg tab twice daily x 4 days, then increase to one 1 mg tablet twice daily. Patient not taking: Reported on 08/31/2018 05/18/18   [name], NP    Physical Exam: Vitals:   08/31/18 1509 08/31/18 1605 08/31/18 1904 08/31/18 1933  BP:  120/88 (!) 131/94   Pulse:  (!) 106 (!) 121   Resp:  16 (!) 24   Temp:   98.3 F (36.8 C)   TempSrc:   Oral   SpO2: 100% 100% 98% 97%  Weight:      Height:          Constitutional: Moderately built and nourished. Vitals:   08/31/18 1509 08/31/18 1605 08/31/18 1904 08/31/18 1933  BP:  120/88 (!) 131/94   Pulse:  (!) 106 (!) 121   Resp:  16 (!) 24   Temp:   98.3 F (36.8 C)   TempSrc:   Oral   SpO2: 100% 100% 98% 97%  Weight:      Height:       Eyes: Anicteric no pallor. ENMT: No discharge  from the ears eyes nose and mouth. Neck: No mass felt.  No neck rigidity. Respiratory: Bilateral expiratory wheeze.  No crepitations. Cardiovascular: S1-S2 heard. Abdomen: Soft nontender bowel sounds present. Musculoskeletal: No edema.  No joint effusion. Skin: No rash. Neurologic: Alert awake oriented to time place and person.  Moves all extremities. Psychiatric: Appears normal.  Normal affect.   Labs on Admission: I have personally reviewed following labs and imaging studies  CBC: Recent Labs  Lab 08/31/18 1459  WBC 6.3  NEUTROABS  3.5  HGB 12.8  HCT 41.4  MCV 97.9  PLT 619   Basic Metabolic Panel: Recent Labs  Lab 08/31/18 1459  NA 140  K 4.3  CL 105  CO2 25  GLUCOSE 107*  BUN 11  CREATININE 0.72  CALCIUM 9.5   GFR: Estimated Creatinine Clearance: 40.7 mL/min (by C-G formula based on SCr of 0.72 mg/dL). Liver Function Tests: No results for input(s): AST, ALT, ALKPHOS, BILITOT, PROT, ALBUMIN in the last 168 hours. No results for input(s): LIPASE, AMYLASE in the last 168 hours. No results for input(s): AMMONIA in the last 168 hours. Coagulation Profile: No results for input(s): INR, PROTIME in the last 168 hours. Cardiac Enzymes: Recent Labs  Lab 08/31/18 1459  TROPONINI <0.03   BNP (last 3 results) No results for input(s): PROBNP in the last 8760 hours. HbA1C: No results for input(s): HGBA1C in the last 72 hours. CBG: No results for input(s): GLUCAP in the last 168 hours. Lipid Profile: No results for input(s): CHOL, HDL, LDLCALC, TRIG, CHOLHDL, LDLDIRECT in the last 72 hours. Thyroid Function Tests: No results for input(s): TSH, T4TOTAL, FREET4, T3FREE, THYROIDAB in the last 72 hours. Anemia Panel: No results for input(s): VITAMINB12, FOLATE, FERRITIN, TIBC, IRON, RETICCTPCT in the last 72 hours. Urine analysis:    Component Value Date/Time   COLORURINE YELLOW 08/06/2018 1200   APPEARANCEUR CLEAR 08/06/2018 1200   LABSPEC 1.010 08/06/2018 1200    PHURINE 7.0 08/06/2018 1200   GLUCOSEU NEGATIVE 08/06/2018 1200   HGBUR SMALL (A) 08/06/2018 1200   BILIRUBINUR NEGATIVE 08/06/2018 1200   BILIRUBINUR 1+ 11/30/2017 1348   KETONESUR NEGATIVE 08/06/2018 1200   PROTEINUR 100 (A) 08/06/2018 1200   UROBILINOGEN 2.0 (A) 11/30/2017 1348   NITRITE NEGATIVE 08/06/2018 1200   LEUKOCYTESUR SMALL (A) 08/06/2018 1200   Sepsis Labs: @LABRCNTIP (procalcitonin:4,lacticidven:4) )No results found for this or any previous visit (from the past 240 hour(s)).   Radiological Exams on Admission: Dg Chest 2 View  Result Date: 08/31/2018 CLINICAL DATA:  Shortness of breath and cough for 2 weeks. History of bladder cancer. EXAM: CHEST - 2 VIEW COMPARISON:  CT chest and chest radiograph August 06, 2018 FINDINGS: Cardiomediastinal silhouette is normal. Calcified aortic arch. Chronic interstitial changes and increased lung volumes with flattened hemidiaphragms. No pleural effusion or focal consolidation. No pneumothorax. Single-lumen RIGHT chest Port-A-Cath distal tip projects in mid superior vena cava. Patient is cachectic. Osseous structures are nonacute. Thoracolumbar mild dextroscoliosis. IMPRESSION: 1. No acute cardiopulmonary process. 2.  Emphysema (ICD10-J43.9). 3.  Aortic Atherosclerosis (ICD10-I70.0). Electronically Signed   By: Elon Alas M.D.   On: 08/31/2018 14:35   Ct Angio Chest Pe W And/or Wo Contrast  Result Date: 08/31/2018 CLINICAL DATA:  Patient is a 63 year old female with a history of hypertensive cardiomyopathy without failure, TIA, bladder cancer status post surgery who is not getting current chemotherapy, healthcare associated pneumonia and COPD presenting today with persistent shortness of breath. Patient states that for the last month or 2 she has had worsening dyspnea with exertion. She has had a cough productive of mucus intermittently and states she wakes up drenched in sweat but denies known fever. She does have inhalers she is using at  home but does not feel those significantly improve her shortness of breath. Sitting down and resting resolves her shortness of breath. She never has chest pain with this and symptoms are usually better if she lies down. EXAM: CT ANGIOGRAPHY CHEST WITH CONTRAST TECHNIQUE: Multidetector CT imaging of the chest was performed  using the standard protocol during bolus administration of intravenous contrast. Multiplanar CT image reconstructions and MIPs were obtained to evaluate the vascular anatomy. CONTRAST:  35mL ISOVUE-370 IOPAMIDOL (ISOVUE-370) INJECTION 76% COMPARISON:  Current chest radiograph.  CTA chest, 08/06/2018. FINDINGS: Cardiovascular: There is satisfactory opacification of the pulmonary arteries to the segmental level. There is no evidence of a pulmonary embolism. Heart is normal in size and configuration. No pericardial effusion. There are three-vessel coronary artery calcifications. Ascending aorta at is prominent measuring 3.7 cm in diameter. There is aortic atherosclerosis extending to the origin of the branch vessels, without significant stenosis. No aortic dissection. Mediastinum/Nodes: No neck base, axillary, mediastinal or hilar masses or enlarged lymph nodes. Trachea and esophagus are unremarkable. Lungs/Pleura: Advanced emphysema. Mild scarring or atelectasis or a combination at the lung bases. No pleural effusion or pneumothorax. Upper Abdomen: No acute abnormality. Musculoskeletal: No fracture or acute finding. No osteoblastic or osteolytic lesions. Review of the MIP images confirms the above findings. IMPRESSION: 1. No evidence of a pulmonary embolism. 2. No acute findings. 3. Advanced emphysema.  Mild lung base scarring and/or atelectasis. 4. Aortic atherosclerosis.  Coronary artery calcifications. Aortic Atherosclerosis (ICD10-I70.0) and Emphysema (ICD10-J43.9). Electronically Signed   By: Lajean Manes M.D.   On: 08/31/2018 16:17    EKG: Independently reviewed.  Sinus tachycardia with  ST-T changes in the anterior leads comparable to the old EKG.  Assessment/Plan Principal Problem:   Acute on chronic respiratory failure with hypoxia (HCC) Active Problems:   Essential hypertension   Emphysema of lung (HCC)   Acute respiratory distress   Acute respiratory failure with hypoxia (HCC)    1. Acute on chronic respiratory failure secondary to COPD exacerbation for which patient has been placed on nebulizer Pulmicort and IV steroids.  Check respiratory viral panel. 2. Hypertension on amlodipine. 3. Brief runs of tachycardia for which we will check thyroid function test and monitor in telemetry. 4. Bladder cancer being monitored by [name].  Had a cystoscopy per the patient this morning. 5. Anemia present hemoglobin appears to have improved from previous.  Follow CBC.   DVT prophylaxis: Lovenox. Code Status: Full code. Family Communication: Discussed with patient. Disposition Plan: Home. Consults called: None. Admission status: Observation.   Rise Patience MD [facility] Pager [phone].  If 7PM-7AM, please contact night-coverage [email or web address] Password [email or web address]  08/31/2018, 9:22 PM

## 2018-08-31 NOTE — Progress Notes (Signed)
Pt arrived, VS stable, tele connected.

## 2018-08-31 NOTE — ED Notes (Signed)
Transferred to [facility].

## 2018-08-31 NOTE — ED Provider Notes (Signed)
[place] EMERGENCY DEPARTMENT Provider Note   CSN: [id number] Arrival date & time: 08/31/18  1312     History   Chief Complaint Chief Complaint  Patient presents with  . Shortness of Breath    HPI GREGORY DOWE is a 63 y.o. female.  Patient is a 63 year old female with a history of hypertensive cardiomyopathy without failure, TIA, bladder cancer status post surgery who is not getting current chemotherapy, healthcare associated pneumonia and COPD presenting today with persistent shortness of breath.  Patient states that for the last month or 2 she has had worsening dyspnea with exertion.  She has had a cough productive of mucus intermittently and states she wakes up drenched in sweat but denies known fever.  She does have inhalers she is using at home but does not feel those significantly improve her shortness of breath.  Sitting down and resting resolves her shortness of breath.  She never has chest pain with this and symptoms are usually better if she lies down.  She denies any swelling but has been recently admitted at the end of January for COPD exacerbation.  Today she was seen at the cancer center and had testing done and they think the cancer of her bladder has returned and they may need to do further procedures.  No prior history of DVT or PE.   Shortness of Breath    Past Medical History:  Diagnosis Date  . Anemia   . Family history of adverse reaction to anesthesia    brother wakes up and dont know who he is and sister has n/v  . Goals of care, counseling/discussion 02/09/2018  . History of kidney stones   . Hypertension   . Iron deficiency anemia due to chronic blood loss 03/01/2018  . Measles as a child  . Mumps as a child  . TIA (transient ischemic attack)   . Tobacco abuse     Patient Active Problem List   Diagnosis Date Noted  . HCAP (healthcare-associated pneumonia) 08/06/2018  . Emphysema of lung ([place]) 08/06/2018  . Acute on chronic respiratory  failure with hypoxia ([place]) 08/06/2018  . Iron deficiency anemia due to chronic blood loss 03/01/2018  . Goals of care, counseling/discussion 02/09/2018  . Bladder cancer ([place]) 12/13/2017  . Hypertensive cardiomyopathy, 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[place]) 04/29/2016  . Hypokalemia 04/02/2016  . Protein-energy malnutrition ([place]) 04/02/2016  . Hypertensive emergency 04/01/2016  . Dizziness and giddiness 04/12/2013  . Palpitations 03/27/2013  . Weight loss 06/08/2012  . Degenerative disc disease, lumbar 01/30/2012  . Adrenal mass ([place]) 01/30/2012  . Hypercalcemia 01/30/2012  . Hyperproteinemia 01/30/2012  . Borderline diabetes 01/30/2012  . Normocytic anemia 03/11/2011  . TOBACCO ABUSE 08/31/2009  . Essential hypertension 08/31/2009    Past Surgical History:  Procedure Laterality Date  . CYSTOSCOPY W/ URETERAL STENT PLACEMENT Right 12/13/2017   Procedure: CYSTOSCOPY WITH RIGHT RETROGRADE PYELOGRAM/URETERAL RIGHT STENT PLACEMENT;  Surgeon: [name], MD;  Location: [facility];  Service: Urology;  Laterality: Right;  . IR IMAGING GUIDED PORT INSERTION  02/23/2018  . TRANSURETHRAL RESECTION OF BLADDER TUMOR N/A 12/13/2017   Procedure: TRANSURETHRAL RESECTION OF BLADDER TUMOR (TURBT);  Surgeon: [name], MD;  Location: [facility];  Service: Urology;  Laterality: N/A;  . TRANSURETHRAL RESECTION OF BLADDER TUMOR WITH GYRUS (TURBT-GYRUS)     [name] 12-13-17  . tubes tied    . uterine ablation     about 2005     OB History  No obstetric history on file.      Home Medications    Prior to Admission medications   Medication Sig Start Date End Date Taking? Authorizing Provider  acetaminophen (TYLENOL) 500 MG tablet Take 1,000 mg by mouth every 8 (eight) hours as needed for moderate pain.    [provider]  albuterol (PROVENTIL HFA;VENTOLIN HFA) 108 (90 Base) MCG/ACT inhaler Inhale 2 puffs into the lungs every 6 (six) hours as needed for wheezing or shortness of breath.  08/08/18   [name], MD  amLODipine (NORVASC) 10 MG tablet Take 5 mg by mouth daily.    [provider]  budesonide-formoterol (SYMBICORT) 160-4.5 MCG/ACT inhaler Inhale 2 puffs into the lungs 2 (two) times daily. 08/08/18   [name], MD  budesonide-formoterol [facility]) 160-4.5 MCG/ACT inhaler  08/09/18   [provider]  docusate sodium (COLACE) 100 MG capsule Take 1 capsule (100 mg total) by mouth 2 (two) times daily. 12/14/17   [name], MD  ergocalciferol (VITAMIN D2) 1.25 MG (50000 UT) capsule Take 1 capsule (50,000 Units total) by mouth once a week. 08/14/18   [name], MD  Ipratropium-Albuterol (COMBIVENT) 20-100 MCG/ACT AERS respimat Inhale 1 puff into the lungs every 6 (six) hours. 08/08/18   [name], MD  lidocaine-prilocaine (EMLA) cream Apply to affected area once 03/01/18   [name], [name], MD  MARINOL 2.5 MG capsule TAKE 1 CAPSULE BY MOUTH TWICE DAILY BEFORE A MEAL Patient taking differently: Take 2.5 mg by mouth daily before lunch.  02/28/18   [name], MD  megestrol (MEGACE ES) 625 MG/5ML suspension Take 5 mLs (625 mg total) by mouth daily. Patient not taking: Reported on 08/06/2018 12/28/17   [name], MD  nicotine (NICODERM CQ - DOSED IN MG/24 HOURS) 14 mg/24hr patch Place 1 patch (14 mg total) onto the skin daily. 08/09/18   [name], MD  ondansetron (ZOFRAN) 8 MG tablet Take 1 tablet (8 mg total) by mouth 2 (two) times daily as needed. Start on the third day after chemotherapy. Patient not taking: Reported on 08/06/2018 03/01/18   [name], MD  oxyCODONE (OXY IR/ROXICODONE) 5 MG immediate release tablet Take 1 tablet (5 mg total) by mouth every 6 (six) hours as needed for severe pain. 08/13/18   [name], MD  varenicline (CHANTIX PAK) 0.5 MG X 11 & 1 MG X 42 tablet Take one 0.5 mg tablet daily x 3 days, then increase to one 0.5 mg tab twice daily x 4 days, then increase to  one 1 mg tablet twice daily. 05/18/18   [name], NP    Family History Family History  Problem Relation Age of Onset  . Hypertension Mother   . Diabetes Father   . Hypertension Brother   . HIV Brother     Social History Social History   Tobacco Use  . Smoking status: Current Some Day Smoker    Packs/day: 0.50    Types: Cigarettes    Last attempt to quit: 06/15/2018    Years since quitting: 0.2  . Smokeless tobacco: Never Used  Substance Use Topics  . Alcohol use: No    Alcohol/week: 0.0 standard drinks  . Drug use: Yes    Comment: marijuana denies at preop     Allergies   Patient has no known allergies.   Review of Systems Review of Systems  Respiratory: Positive for shortness of breath.  All other systems reviewed and are negative.    Physical Exam Updated Vital Signs BP (!) 149/103   Pulse (!) 110   Temp 98.3 F (36.8 C) (Oral)   Resp (!) 22   Ht 5\' 5"  (1.651 m)   Wt 35.4 kg   SpO2 95%   BMI 12.98 kg/m   Physical Exam Vitals signs and nursing note reviewed.  Constitutional:      General: She is not in acute distress.    Appearance: She is cachectic.  HENT:     Head: Normocephalic and atraumatic.     Nose: Nose normal.     Mouth/Throat:     Mouth: Mucous membranes are moist.  Eyes:     Pupils: Pupils are equal, round, and reactive to light.  Cardiovascular:     Rate and Rhythm: Regular rhythm. Tachycardia present.     Heart sounds: Normal heart sounds. No murmur. No friction rub.  Pulmonary:     Effort: Pulmonary effort is normal. Tachypnea present.     Breath sounds: Decreased air movement present. Wheezing present. No rales.     Comments: Mild pursed lip breathing but able to speak in full sentences.  Port-A-Cath present in the right upper chest Abdominal:     General: Bowel sounds are normal. There is no distension.     Palpations: Abdomen is soft.     Tenderness: There is no abdominal tenderness. There is no guarding or  rebound.  Musculoskeletal: Normal range of motion.        General: No tenderness.     Comments: No edema  Skin:    General: Skin is warm and dry.     Capillary Refill: Capillary refill takes less than 2 seconds.     Findings: No rash.  Neurological:     Mental Status: She is alert and oriented to person, place, and time. Mental status is at baseline.     Cranial Nerves: No cranial nerve deficit.  Psychiatric:        Behavior: Behavior normal.      ED Treatments / Results  Labs (all labs ordered are listed, but only abnormal results are displayed) Labs Reviewed  BASIC METABOLIC PANEL - Abnormal; Notable for the following components:      Result Value   Glucose, Bld 107 (*)    All other components within normal limits  CBC WITH DIFFERENTIAL/PLATELET  TROPONIN I    EKG EKG Interpretation  Date/Time:  Friday August 31 2018 14:57:55 EST Ventricular Rate:  130 PR Interval:    QRS Duration: 93 QT Interval:  310 QTC Calculation: 456 R Axis:   89 Text Interpretation:  Poor quality data, interpretation may be affected Sinus tachycardia Consider right atrial enlargement LVH with secondary repolarization abnormality Probable anterolateral infarct, acute Artifact in lead(s) I II III aVR aVL aVF V1 V2 V3 V4 V5 V6 Confirmed by Blanchie Dessert [phone]) on 08/31/2018 3:21:02 PM   Radiology Dg Chest 2 View  Result Date: 08/31/2018 CLINICAL DATA:  Shortness of breath and cough for 2 weeks. History of bladder cancer. EXAM: CHEST - 2 VIEW COMPARISON:  CT chest and chest radiograph August 06, 2018 FINDINGS: Cardiomediastinal silhouette is normal. Calcified aortic arch. Chronic interstitial changes and increased lung volumes with flattened hemidiaphragms. No pleural effusion or focal consolidation. No pneumothorax. Single-lumen RIGHT chest Port-A-Cath distal tip projects in mid superior vena cava. Patient is cachectic. Osseous structures are nonacute. Thoracolumbar mild dextroscoliosis.  IMPRESSION: 1. No acute cardiopulmonary process. 2.  Emphysema (ICD10-J43.9). 3.  Aortic Atherosclerosis (ICD10-I70.0). Electronically Signed   By: Elon Alas M.D.   On: 08/31/2018 14:35    Procedures Procedures (including critical care time)  Medications Ordered in ED Medications  magnesium sulfate IVPB 2 g 50 mL (has no administration in time range)  albuterol (PROVENTIL) (2.5 MG/3ML) 0.083% nebulizer solution 5 mg (5 mg Nebulization Given 08/31/18 1419)  ipratropium (ATROVENT) nebulizer solution 0.5 mg (0.5 mg Nebulization Given 08/31/18 1419)  predniSONE (DELTASONE) tablet 60 mg (60 mg Oral Given 08/31/18 1441)  levalbuterol (XOPENEX) nebulizer solution 1.25 mg (1.25 mg Nebulization Given 08/31/18 1507)     Initial Impression / Assessment and Plan / ED Course  I have reviewed the triage vital signs and the nursing notes.  Pertinent labs & imaging results that were available during my care of the patient were reviewed by me and considered in my medical decision making (see chart for details).     Patient presenting with exertional dyspnea for the last month in the setting of a history of bladder cancer, COPD and recent hospitalization.  Patient denies any chest pain but is tachycardic and tachypneic here.  She is wheezing diffusely but able to speak in full sentences.  She has been intermittently coughing up some mucus but denies any fever.  She is using inhalers at home but states they are not working.  Patient has no evidence of fluid overload or concern for CHF.  She did have a CTA on 08/06/2018 that showed no signs of PE but did show severe emphysema bilaterally and possible pneumonia.  Patient has had tobacco cessation for the last month.  Patient's x-ray today shows no acute findings.  Patient was given albuterol and Atrovent which she tolerated however shortly after she suddenly became extremely dyspneic with heart rates going up to 150 and oxygen saturation dropping to 80.  Patient  was tripoding, tachypneic and slightly agitated.  This improved with nasal cannula oxygen.  Patient was found to have sinus tachycardia but no evidence of dysrhythmia.  She denied any chest pain during the event and she is still having diffuse wheezing.  Will give Xopenex and more Atrovent for further treatments for the wheezing.  She was given oral prednisone prior to her symptoms starting and then given IV magnesium.  Troponin, CBC, BMP pending but patient will most likely need CT to rule out new acute clot given the abrupt change in her symptoms.  Breath sounds are still equal bilaterally and low suspicion for pneumothorax.  EKG shows sinus tachycardia but no definitive sign of MI.  3:31 PM After second treatment patient initially became much more tachycardic and short of breath which improved some after the Xopenex.  Patient is still wheezing diffusely and complaining of being short of breath.  At this time she is unable to lay down to have a CAT scan but she was checked out to Dr. Tyrone Nine who will continue treatments.  She was given magnesium and will most likely need to go on an hour-long.  CRITICAL CARE Performed by: Humberto Addo Total critical care time: 30 minutes Critical care time was exclusive of separately billable procedures and treating other patients. Critical care was necessary to treat or prevent imminent or life-threatening deterioration. Critical care was time spent personally by me on the following activities: development of treatment plan with patient and/or surrogate as well as nursing, discussions with consultants, evaluation of patient's response to treatment, examination of patient, obtaining history from patient or surrogate, ordering and  performing treatments and interventions, ordering and review of laboratory studies, ordering and review of radiographic studies, pulse oximetry and re-evaluation of patient's condition.   Final Clinical Impressions(s) / ED Diagnoses    Final diagnoses:  None    ED Discharge Orders    None       Blanchie Dessert, MD 08/31/18 [phone]

## 2018-09-01 ENCOUNTER — Other Ambulatory Visit: Payer: Self-pay

## 2018-09-01 DIAGNOSIS — J432 Centrilobular emphysema: Secondary | ICD-10-CM | POA: Diagnosis present

## 2018-09-01 DIAGNOSIS — Z681 Body mass index (BMI) 19 or less, adult: Secondary | ICD-10-CM | POA: Diagnosis not present

## 2018-09-01 DIAGNOSIS — R0602 Shortness of breath: Secondary | ICD-10-CM | POA: Diagnosis not present

## 2018-09-01 DIAGNOSIS — D500 Iron deficiency anemia secondary to blood loss (chronic): Secondary | ICD-10-CM | POA: Diagnosis present

## 2018-09-01 DIAGNOSIS — I43 Cardiomyopathy in diseases classified elsewhere: Secondary | ICD-10-CM | POA: Diagnosis present

## 2018-09-01 DIAGNOSIS — Z87442 Personal history of urinary calculi: Secondary | ICD-10-CM | POA: Diagnosis not present

## 2018-09-01 DIAGNOSIS — Z833 Family history of diabetes mellitus: Secondary | ICD-10-CM | POA: Diagnosis not present

## 2018-09-01 DIAGNOSIS — E43 Unspecified severe protein-calorie malnutrition: Secondary | ICD-10-CM | POA: Diagnosis present

## 2018-09-01 DIAGNOSIS — Z713 Dietary counseling and surveillance: Secondary | ICD-10-CM | POA: Diagnosis not present

## 2018-09-01 DIAGNOSIS — Z923 Personal history of irradiation: Secondary | ICD-10-CM | POA: Diagnosis not present

## 2018-09-01 DIAGNOSIS — J439 Emphysema, unspecified: Secondary | ICD-10-CM | POA: Diagnosis not present

## 2018-09-01 DIAGNOSIS — Z9221 Personal history of antineoplastic chemotherapy: Secondary | ICD-10-CM | POA: Diagnosis not present

## 2018-09-01 DIAGNOSIS — Z8673 Personal history of transient ischemic attack (TIA), and cerebral infarction without residual deficits: Secondary | ICD-10-CM | POA: Diagnosis not present

## 2018-09-01 DIAGNOSIS — Z87891 Personal history of nicotine dependence: Secondary | ICD-10-CM | POA: Diagnosis not present

## 2018-09-01 DIAGNOSIS — Z8249 Family history of ischemic heart disease and other diseases of the circulatory system: Secondary | ICD-10-CM | POA: Diagnosis not present

## 2018-09-01 DIAGNOSIS — C679 Malignant neoplasm of bladder, unspecified: Secondary | ICD-10-CM | POA: Diagnosis present

## 2018-09-01 DIAGNOSIS — J9621 Acute and chronic respiratory failure with hypoxia: Principal | ICD-10-CM

## 2018-09-01 DIAGNOSIS — Z79899 Other long term (current) drug therapy: Secondary | ICD-10-CM | POA: Diagnosis not present

## 2018-09-01 DIAGNOSIS — F17210 Nicotine dependence, cigarettes, uncomplicated: Secondary | ICD-10-CM | POA: Diagnosis not present

## 2018-09-01 DIAGNOSIS — J9601 Acute respiratory failure with hypoxia: Secondary | ICD-10-CM | POA: Diagnosis not present

## 2018-09-01 DIAGNOSIS — Z8701 Personal history of pneumonia (recurrent): Secondary | ICD-10-CM | POA: Diagnosis not present

## 2018-09-01 DIAGNOSIS — J441 Chronic obstructive pulmonary disease with (acute) exacerbation: Secondary | ICD-10-CM | POA: Diagnosis not present

## 2018-09-01 DIAGNOSIS — I119 Hypertensive heart disease without heart failure: Secondary | ICD-10-CM | POA: Diagnosis present

## 2018-09-01 DIAGNOSIS — I10 Essential (primary) hypertension: Secondary | ICD-10-CM | POA: Diagnosis not present

## 2018-09-01 DIAGNOSIS — D630 Anemia in neoplastic disease: Secondary | ICD-10-CM | POA: Diagnosis present

## 2018-09-01 LAB — TROPONIN I
Troponin I: 0.03 ng/mL (ref <0.03)
Troponin I: <0.03 ng/mL (ref <0.03)

## 2018-09-01 LAB — CBC
HCT: 34.1 % — ABNORMAL LOW (ref 36.0–46.0)
Hemoglobin: 10.7 g/dL — ABNORMAL LOW (ref 12.0–15.0)
MCH: 30.2 pg (ref 26.0–34.0)
MCHC: 31.4 g/dL (ref 30.0–36.0)
MCV: 96.3 fL (ref 80.0–100.0)
Platelets: 185 10*3/uL (ref 150–400)
RBC: 3.54 MIL/uL — ABNORMAL LOW (ref 3.87–5.11)
RDW: 14.1 % (ref 11.5–15.5)
WBC: 3.5 10*3/uL — ABNORMAL LOW (ref 4.0–10.5)
nRBC: 0 % (ref 0.0–0.2)

## 2018-09-01 LAB — BASIC METABOLIC PANEL
Anion gap: 8 (ref 5–15)
BUN: 15 mg/dL (ref 8–23)
CO2: 28 mmol/L (ref 22–32)
Calcium: 9.4 mg/dL (ref 8.9–10.3)
Chloride: 105 mmol/L (ref 98–111)
Creatinine, Ser: 0.9 mg/dL (ref 0.44–1.00)
GFR calc Af Amer: >60 mL/min (ref >60)
GFR calc non Af Amer: >60 mL/min (ref >60)
Glucose, Bld: 113 mg/dL — ABNORMAL HIGH (ref 70–99)
Potassium: 4.5 mmol/L (ref 3.5–5.1)
Sodium: 141 mmol/L (ref 135–145)

## 2018-09-01 LAB — TSH: TSH: 0.125 u[IU]/mL — ABNORMAL LOW (ref 0.350–4.500)

## 2018-09-01 LAB — T4, FREE: Free T4: 0.94 ng/dL (ref 0.82–1.77)

## 2018-09-01 MED ORDER — ENSURE ENLIVE PO LIQD: 237.0000 mL | Freq: Two times a day (BID) | ORAL | Status: DC

## 2018-09-01 MED ORDER — METHYLPREDNISOLONE SODIUM SUCC 40 MG IJ SOLR
40.0000 mg | Freq: Two times a day (BID) | INTRAMUSCULAR | Status: DC
  Administered 2018-09-01 – 2018-09-03 (×4): 40 mg via INTRAVENOUS
  Filled 2018-09-01 (×4): qty 1

## 2018-09-01 NOTE — Progress Notes (Signed)
PROGRESS NOTE  Donna Curry  WUX:[id number] DOB: October 08, 1955 DOA: 08/31/2018 PCP: [name], NP   Brief Narrative: Donna Curry is a 63 y.o. female with a history of COPD, recent HCAP, and bladder CA who presented to [facility] ED with dyspnea worsening for 3 days associated with cough and wheezing on 2/7. In the ED she was treated for COPD exacerbation with limited improvement. Had a tachycardic episode with acute worsening of dyspnea though ECG had not changed when compared to priors and CTA chest showed no acute findings. Due to ongoing hypoxia, she was admitted to [facility].   Assessment & Plan: Principal Problem:   Acute on chronic respiratory failure with hypoxia (HCC) Active Problems:   Essential hypertension   Emphysema of lung (HCC)   Acute respiratory distress   Acute respiratory failure with hypoxia (HCC)  Acute hypoxic respiratory failure, centrilobular emphysema, prolonged tobacco use: Suspected to be due to COPD exacerbation. Severe emphysema on CTA chest without other acute finding, per my personal interpretation of images 2/8. Wheezing improved with steroids and nebulizer therapies.  - Continue supplemental oxygen to maintain SpO2 88-95% - Needs pulmonology follow up, has seen Dr. [place] Bells, ambulatory pulse oximetry prior to discharge.  - RVP pending, empiric droplet precautions.  Low TSH: TSH 0.125. No goiter on exam, not taking biotin. - Check free T3, free T4.   Bladder CA, muscle invasive, nonmetastatic: Follows with urology, [name], and oncology, [name]. Completed radiation/low-dose weekly cis-platinum (6 cycles) on 04/05/2018 - Will add [name] to Tx team as Juluis Rainier.   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 Will continue prn IV iron per oncology  Severe protein-calorie malnutrition: BMI 12, worsened with chemotherapy and malignancy.  - Dietitian consulted  HTN:  - Continue  norvasc  Sinus tachycardia: Beta agonists contributing. TSH low as above.  - No arrhythmias on telemetry thus far. Will DC telemetry if no ectopy picked up by tomorrow.  - Consider beta blocker  DVT prophylaxis: Lovenox 20mg  (still a little over 0.5mg /kg) q24h Code Status: Full Family Communication: None at bedside Disposition Plan: Since she still has a new oxygen requirement and is not at respiratory baseline, requiring IV steroids, she will need to remain inpatient she will need to remain inpatient for at least 2 midnights. Therefore, will change status to inpatient.   Consultants:   None  Procedures:   None  Antimicrobials:  None   Subjective: Dyspnea is not much ebtter than arrival, severe, constant, waxing/waning. Not associated with any pain, including chest pain. Smoking for ~48 years, quit 4 weeks ago. Baseline is able to walk 7ft without dyspnea, can't move around in bed without dyspnea now. Dyspnea also overall worse wince starting chemotherapy in Aug 2019.   Objective: Vitals:   08/31/18 2211 08/31/18 2312 09/01/18 0515 09/01/18 1421  BP: 128/78  135/82 130/89  Pulse: (!) 110  94 89  Resp: 18  18 15   Temp: 98.3 F (36.8 C)  97.8 F (36.6 C) 98.3 F (36.8 C)  TempSrc: Oral  Oral Oral  SpO2: 97% 97% 98% 98%  Weight:      Height:        Intake/Output Summary (Last 24 hours) at 09/01/2018 1516 Last data filed at 09/01/2018 0500 Gross per 24 hour  Intake 410.8 ml  Output 150 ml  Net 260.8 ml   Filed Weights   08/31/18 1317  Weight: 35.4 kg  Gen: Thin, frail female in no distress Pulm: Tachypneic, prolonged expiratory phase with diminished breath sounds diffusely, scant expiratory wheezing. CV: Regular rate and rhythm. No murmur, rub, or gallop. No JVD, no pedal edema. GI: Abdomen soft, non-tender, non-distended, with normoactive bowel sounds. No organomegaly or masses felt. Ext: Warm, no deformities Skin: No rashes, lesions or ulcers Neuro: Alert and  oriented. No focal neurological deficits. Psych: Judgement and insight appear normal. Mood & affect appropriate.   Data Reviewed: I have personally reviewed following labs and imaging studies  CBC: Recent Labs  Lab 08/31/18 1459 08/31/18 2211 09/01/18 0402  WBC 6.3 5.0 3.5*  NEUTROABS 3.5  --   --   HGB 12.8 10.3* 10.7*  HCT 41.4 32.6* 34.1*  MCV 97.9 98.2 96.3  PLT 206 168 601   Basic Metabolic Panel: Recent Labs  Lab 08/31/18 1459 08/31/18 2211 09/01/18 0402  NA 140  --  141  K 4.3  --  4.5  CL 105  --  105  CO2 25  --  28  GLUCOSE 107*  --  113*  BUN 11  --  15  CREATININE 0.72 0.97 0.90  CALCIUM 9.5  --  9.4  MG  --  2.5*  --    GFR: Estimated Creatinine Clearance: 36.2 mL/min (by C-G formula based on SCr of 0.9 mg/dL). Liver Function Tests: No results for input(s): AST, ALT, ALKPHOS, BILITOT, PROT, ALBUMIN in the last 168 hours. No results for input(s): LIPASE, AMYLASE in the last 168 hours. No results for input(s): AMMONIA in the last 168 hours. Coagulation Profile: No results for input(s): INR, PROTIME in the last 168 hours. Cardiac Enzymes: Recent Labs  Lab 08/31/18 1459 08/31/18 2211 09/01/18 0657 09/01/18 1210  TROPONINI <0.03 0.03* 0.03* <0.03   BNP (last 3 results) No results for input(s): PROBNP in the last 8760 hours. HbA1C: No results for input(s): HGBA1C in the last 72 hours. CBG: No results for input(s): GLUCAP in the last 168 hours. Lipid Profile: No results for input(s): CHOL, HDL, LDLCALC, TRIG, CHOLHDL, LDLDIRECT in the last 72 hours. Thyroid Function Tests: Recent Labs    08/31/18 2211  TSH 0.125*   Anemia Panel: No results for input(s): VITAMINB12, FOLATE, FERRITIN, TIBC, IRON, RETICCTPCT in the last 72 hours. Urine analysis:    Component Value Date/Time   COLORURINE YELLOW 08/06/2018 1200   APPEARANCEUR CLEAR 08/06/2018 1200   LABSPEC 1.010 08/06/2018 1200   PHURINE 7.0 08/06/2018 1200   GLUCOSEU NEGATIVE 08/06/2018  1200   HGBUR SMALL (A) 08/06/2018 1200   BILIRUBINUR NEGATIVE 08/06/2018 1200   BILIRUBINUR 1+ 11/30/2017 1348   KETONESUR NEGATIVE 08/06/2018 1200   PROTEINUR 100 (A) 08/06/2018 1200   UROBILINOGEN 2.0 (A) 11/30/2017 1348   NITRITE NEGATIVE 08/06/2018 1200   LEUKOCYTESUR SMALL (A) 08/06/2018 1200   No results found for this or any previous visit (from the past 240 hour(s)).    Radiology Studies: Dg Chest 2 View  Result Date: 08/31/2018 CLINICAL DATA:  Shortness of breath and cough for 2 weeks. History of bladder cancer. EXAM: CHEST - 2 VIEW COMPARISON:  CT chest and chest radiograph August 06, 2018 FINDINGS: Cardiomediastinal silhouette is normal. Calcified aortic arch. Chronic interstitial changes and increased lung volumes with flattened hemidiaphragms. No pleural effusion or focal consolidation. No pneumothorax. Single-lumen RIGHT chest Port-A-Cath distal tip projects in mid superior vena cava. Patient is cachectic. Osseous structures are nonacute. Thoracolumbar mild dextroscoliosis. IMPRESSION: 1. No acute cardiopulmonary process. 2.  Emphysema (ICD10-J43.9). 3.  Aortic Atherosclerosis (ICD10-I70.0). Electronically Signed   By: Elon Alas M.D.   On: 08/31/2018 14:35   Ct Angio Chest Pe W And/or Wo Contrast  Result Date: 08/31/2018 CLINICAL DATA:  Patient is a 63 year old female with a history of hypertensive cardiomyopathy without failure, TIA, bladder cancer status post surgery who is not getting current chemotherapy, healthcare associated pneumonia and COPD presenting today with persistent shortness of breath. Patient states that for the last month or 2 she has had worsening dyspnea with exertion. She has had a cough productive of mucus intermittently and states she wakes up drenched in sweat but denies known fever. She does have inhalers she is using at home but does not feel those significantly improve her shortness of breath. Sitting down and resting resolves her shortness of  breath. She never has chest pain with this and symptoms are usually better if she lies down. EXAM: CT ANGIOGRAPHY CHEST WITH CONTRAST TECHNIQUE: Multidetector CT imaging of the chest was performed using the standard protocol during bolus administration of intravenous contrast. Multiplanar CT image reconstructions and MIPs were obtained to evaluate the vascular anatomy. CONTRAST:  16mL ISOVUE-370 IOPAMIDOL (ISOVUE-370) INJECTION 76% COMPARISON:  Current chest radiograph.  CTA chest, 08/06/2018. FINDINGS: Cardiovascular: There is satisfactory opacification of the pulmonary arteries to the segmental level. There is no evidence of a pulmonary embolism. Heart is normal in size and configuration. No pericardial effusion. There are three-vessel coronary artery calcifications. Ascending aorta at is prominent measuring 3.7 cm in diameter. There is aortic atherosclerosis extending to the origin of the branch vessels, without significant stenosis. No aortic dissection. Mediastinum/Nodes: No neck base, axillary, mediastinal or hilar masses or enlarged lymph nodes. Trachea and esophagus are unremarkable. Lungs/Pleura: Advanced emphysema. Mild scarring or atelectasis or a combination at the lung bases. No pleural effusion or pneumothorax. Upper Abdomen: No acute abnormality. Musculoskeletal: No fracture or acute finding. No osteoblastic or osteolytic lesions. Review of the MIP images confirms the above findings. IMPRESSION: 1. No evidence of a pulmonary embolism. 2. No acute findings. 3. Advanced emphysema.  Mild lung base scarring and/or atelectasis. 4. Aortic atherosclerosis.  Coronary artery calcifications. Aortic Atherosclerosis (ICD10-I70.0) and Emphysema (ICD10-J43.9). Electronically Signed   By: Lajean Manes M.D.   On: 08/31/2018 16:17    Scheduled Meds: . amLODipine  5 mg Oral Daily  . budesonide (PULMICORT) nebulizer solution  0.25 mg Nebulization BID  . docusate sodium  100 mg Oral BID  . dronabinol  2.5 mg Oral  q morning - 10a  . enoxaparin (LOVENOX) injection  20 mg Subcutaneous Q24H  . feeding supplement (ENSURE ENLIVE)  237 mL Oral BID BM  . ipratropium  0.5 mg Nebulization Q6H  . levalbuterol  1.25 mg Nebulization Q6H  . nicotine  14 mg Transdermal Daily   Continuous Infusions:   LOS: 0 days   Time spent: 25 minutes.  Patrecia Pour, MD [facility] [email or web address] Password [facility] 09/01/2018, 3:16 PM

## 2018-09-01 NOTE — Progress Notes (Signed)
CRITICAL VALUE ALERT  Critical Value:  Troponin 0.03  Date & Time Notied:  08/31/2018 2325  Provider Notified: Kennon Holter, NP  Orders Received/Actions taken: none received. [facility] called about recent page. I explained I had paged twice, once about critical level troponin on this pt and once about a different pt wanting protonix. No orders received on this patient.

## 2018-09-02 ENCOUNTER — Inpatient Hospital Stay (HOSPITAL_COMMUNITY): Payer: Medicare Other

## 2018-09-02 DIAGNOSIS — I10 Essential (primary) hypertension: Secondary | ICD-10-CM

## 2018-09-02 DIAGNOSIS — R0602 Shortness of breath: Secondary | ICD-10-CM

## 2018-09-02 DIAGNOSIS — F17210 Nicotine dependence, cigarettes, uncomplicated: Secondary | ICD-10-CM

## 2018-09-02 DIAGNOSIS — J441 Chronic obstructive pulmonary disease with (acute) exacerbation: Secondary | ICD-10-CM

## 2018-09-02 DIAGNOSIS — C679 Malignant neoplasm of bladder, unspecified: Secondary | ICD-10-CM

## 2018-09-02 LAB — RESPIRATORY PANEL BY PCR

## 2018-09-02 LAB — ECHOCARDIOGRAM COMPLETE
Height: 65 in
Weight: 1248 oz

## 2018-09-02 LAB — T3, FREE: T3, Free: 2 pg/mL (ref 2.0–4.4)

## 2018-09-02 MED ORDER — AZITHROMYCIN 250 MG PO TABS
500.0000 mg | ORAL_TABLET | Freq: Once | ORAL | Status: AC
  Administered 2018-09-02: 500 mg via ORAL
  Filled 2018-09-02: qty 2

## 2018-09-02 MED ORDER — AZITHROMYCIN 250 MG PO TABS
250.0000 mg | ORAL_TABLET | Freq: Every day | ORAL | Status: DC
  Administered 2018-09-03: 250 mg via ORAL
  Filled 2018-09-02: qty 1

## 2018-09-02 MED ORDER — BENZONATATE 100 MG PO CAPS
100.0000 mg | ORAL_CAPSULE | Freq: Three times a day (TID) | ORAL | Status: DC | PRN
  Administered 2018-09-02 – 2018-09-03 (×3): 100 mg via ORAL
  Filled 2018-09-02 (×3): qty 1

## 2018-09-02 MED ORDER — DEXTROMETHORPHAN POLISTIREX ER 30 MG/5ML PO SUER: 30.0000 mg | Freq: Every evening | ORAL | Status: DC | PRN

## 2018-09-02 NOTE — Evaluation (Signed)
Physical Therapy Evaluation Patient Details Name: TAYLOR SPILDE MRN: [id number] DOB: 09/05/1955 Today's Date: 09/02/2018   History of Present Illness  63 yo female admitted with COPD exac. Hx of COPD, bladder ca, TIA  Clinical Impression  On eval, pt was Min assist for mobility. She walked ~45 feet with intermittent assist for steadying. O2 sat: 94% on Ra at rest; 89% on RA during ambulation. Dyspne 3/4 with audible wheezing/rattling. Will continue to follow and progress activity as tolerated.     Follow Up Recommendations Home health PT;Supervision - Intermittent    Equipment Recommendations  None recommended by PT    Recommendations for Other Services       Precautions / Restrictions Precautions Precautions: Fall Precaution Comments: monitor O2 Restrictions Weight Bearing Restrictions: No      Mobility  Bed Mobility Overal bed mobility: Modified Independent                Transfers Overall transfer level: Needs assistance   Transfers: Sit to/from Stand Sit to Stand: Min assist         General transfer comment: Assist to rise, stabilize.   Ambulation/Gait Ambulation/Gait assistance: Min assist Gait Distance (Feet): 45 Feet Assistive device: None(intermittent furniture walking) Gait Pattern/deviations: Step-through pattern;Decreased stride length     General Gait Details: Assist to steady thorughout ambulation distance. O2 sat 89% on RA, dyspnea 3/4 with audible wheezing/rattling.   Stairs            Wheelchair Mobility    Modified Rankin (Stroke Patients Only)       Balance Overall balance assessment: Needs assistance           Standing balance-Leahy Scale: Fair                               Pertinent Vitals/Pain Pain Assessment: No/denies pain    Home Living Family/patient expects to be discharged to:: Private residence Living Arrangements: Alone Available Help at Discharge: Family Type of Home: House Home  Access: Stairs to enter Entrance Stairs-Rails: None Technical brewer of Steps: 3 Home Layout: One level Home Equipment: Bedside commode;Cane - quad      Prior Function Level of Independence: Needs assistance   Gait / Transfers Assistance Needed: reports limited ambulation  ADL's / Homemaking Assistance Needed: bathing, dressing with mod ind (seated for energy conservation)  Comments: has difficulty with household tasks     Hand Dominance        Extremity/Trunk Assessment   Upper Extremity Assessment Upper Extremity Assessment: Generalized weakness    Lower Extremity Assessment Lower Extremity Assessment: Generalized weakness    Cervical / Trunk Assessment Cervical / Trunk Assessment: Normal  Communication   Communication: No difficulties  Cognition Arousal/Alertness: Awake/alert Behavior During Therapy: WFL for tasks assessed/performed Overall Cognitive Status: Within Functional Limits for tasks assessed                                        General Comments      Exercises     Assessment/Plan    PT Assessment Patient needs continued PT services  PT Problem List Decreased strength;Decreased balance;Decreased mobility;Decreased activity tolerance;Decreased knowledge of use of DME       PT Treatment Interventions DME instruction;Gait training;Functional mobility training;Therapeutic activities;Balance training;Patient/family education;Therapeutic exercise    PT Goals (Current goals can be found in  the Care Plan section)  Acute Rehab PT Goals Patient Stated Goal: to breathe better PT Goal Formulation: With patient/family Time For Goal Achievement: 09/16/18 Potential to Achieve Goals: Good    Frequency Min 3X/week   Barriers to discharge        Co-evaluation               AM-PAC PT "6 Clicks" Mobility  Outcome Measure Help needed turning from your back to your side while in a flat bed without using bedrails?: None Help  needed moving from lying on your back to sitting on the side of a flat bed without using bedrails?: None Help needed moving to and from a bed to a chair (including a wheelchair)?: A Little Help needed standing up from a chair using your arms (e.g., wheelchair or bedside chair)?: A Little Help needed to walk in hospital room?: A Little Help needed climbing 3-5 steps with a railing? : A Little 6 Click Score: 20    End of Session Equipment Utilized During Treatment: Gait belt Activity Tolerance: Patient limited by fatigue Patient left: in bed;with call bell/phone within reach;with bed alarm set;with family/visitor present   PT Visit Diagnosis: Unsteadiness on feet (R26.81);Muscle weakness (generalized) (M62.81);Difficulty in walking, not elsewhere classified (R26.2)    Time: 2449-7530 PT Time Calculation (min) (ACUTE ONLY): 16 min   Charges:   PT Evaluation $PT Eval Moderate Complexity: Girard, PT Acute Rehabilitation Services Pager: [phone] Office: [phone]  '

## 2018-09-02 NOTE — Progress Notes (Signed)
  Echocardiogram 2D Echocardiogram has been performed.  Donna Curry G Donna Curry 09/02/2018, 1:48 PM

## 2018-09-02 NOTE — Consult Note (Signed)
Referral MD  Reason for Referral: Recurrent bladder cancer  Chief Complaint  Patient presents with  . Shortness of Breath  : I am in the hospital because of my COPD.  HPI: Donna Curry is well-known to me.  She is incredibly charming 63 year old African-American female.  She does look quite a bit older.  She is incredibly thin.  She never has been a big woman.  She presented to our office last year.  She had a locally advanced bladder cancer.  She underwent treatment with radiation and low-dose cis-platinum.  She completed this and September 2019.  She recently had a cystoscopy.  It looks like from what I can tell that she had residual bladder cancer.  Hopefully, the urologist took biopsies.  After this procedure, she began to have breathing difficulties.  She has known COPD.  She was subsequently admitted.  She underwent a CT angiogram.  This was done on 08/31/2018.  CT angiogram did not show any pulmonary embolism.  Lab work that was done yesterday showed a white cell count 3.5.  Hemoglobin 10.7.  Platelet count 185,000.  Her BUN was 15 creatinine 0.9.  1 of the issues that we have had with her is that she is so thin and has had very little weight on her that we have not been able to be aggressive with her therapy.  I am not sure that she would be able to handle standard therapy for bladder cancer.  I probably would consider immunotherapy for her.  Hopefully, she had a biopsy done.  I will have to speak to the urologist.  If he did not get a biopsy, then we will have to have him do another cystoscopy so that we can get a biopsy so we can send the material off for molecular profile.  For right now, the issue is her lungs.  She does have the COPD addressed.  Currently, her performance status is ECOG 2.   Past Medical History:  Diagnosis Date  . Anemia   . Family history of adverse reaction to anesthesia    brother wakes up and dont know who he is and sister has n/v  . Goals of care,  counseling/discussion 02/09/2018  . History of kidney stones   . Hypertension   . Iron deficiency anemia due to chronic blood loss 03/01/2018  . Measles as a child  . Mumps as a child  . TIA (transient ischemic attack)   . Tobacco abuse   :  Past Surgical History:  Procedure Laterality Date  . CYSTOSCOPY W/ URETERAL STENT PLACEMENT Right 12/13/2017   Procedure: CYSTOSCOPY WITH RIGHT RETROGRADE PYELOGRAM/URETERAL RIGHT STENT PLACEMENT;  Surgeon: [name], MD;  Location: [facility];  Service: Urology;  Laterality: Right;  . IR IMAGING GUIDED PORT INSERTION  02/23/2018  . TRANSURETHRAL RESECTION OF BLADDER TUMOR N/A 12/13/2017   Procedure: TRANSURETHRAL RESECTION OF BLADDER TUMOR (TURBT);  Surgeon: [name], MD;  Location: [facility];  Service: Urology;  Laterality: N/A;  . TRANSURETHRAL RESECTION OF BLADDER TUMOR WITH GYRUS (TURBT-GYRUS)     [name] 12-13-17  . tubes tied    . uterine ablation     about 2005  :   Current Facility-Administered Medications:  .  acetaminophen (TYLENOL) tablet 650 mg, 650 mg, Oral, Q6H PRN **OR** acetaminophen (TYLENOL) suppository 650 mg, 650 mg, Rectal, Q6H PRN, Rise Patience, MD .  amLODipine (NORVASC) tablet 5 mg, 5 mg, Oral, Daily, Rise Patience, MD, 5 mg  at 09/01/18 0959 .  budesonide (PULMICORT) nebulizer solution 0.25 mg, 0.25 mg, Nebulization, BID, Rise Patience, MD, 0.25 mg at 09/02/18 0805 .  docusate sodium (COLACE) capsule 100 mg, 100 mg, Oral, BID, Rise Patience, MD, 100 mg at 09/01/18 2209 .  dronabinol (MARINOL) capsule 2.5 mg, 2.5 mg, Oral, q morning - 10a, Rise Patience, MD, 2.5 mg at 09/01/18 0959 .  enoxaparin (LOVENOX) injection 20 mg, 20 mg, Subcutaneous, Q24H, Rise Patience, MD, 20 mg at 09/01/18 2210 .  feeding supplement (ENSURE ENLIVE) (ENSURE ENLIVE) liquid 237 mL, 237 mL, Oral, BID BM, Gean Birchwood N, MD .  ipratropium (ATROVENT) nebulizer solution 0.5 mg, 0.5 mg,  Nebulization, Q6H, Rise Patience, MD, 0.5 mg at 09/02/18 0759 .  levalbuterol (XOPENEX) nebulizer solution 1.25 mg, 1.25 mg, Nebulization, Q6H PRN, Rise Patience, MD .  levalbuterol [facility]) nebulizer solution 1.25 mg, 1.25 mg, Nebulization, Q6H, Rise Patience, MD, 1.25 mg at 09/02/18 0759 .  methylPREDNISolone sodium succinate (SOLU-MEDROL) 40 mg/mL injection 40 mg, 40 mg, Intravenous, Q12H, Vance Gather B, MD, 40 mg at 09/02/18 0546 .  nicotine (NICODERM CQ - dosed in mg/24 hours) patch 14 mg, 14 mg, Transdermal, Daily, Blount, Xenia T, NP, 14 mg at 09/01/18 0959 .  ondansetron (ZOFRAN) tablet 4 mg, 4 mg, Oral, Q6H PRN **OR** ondansetron (ZOFRAN) injection 4 mg, 4 mg, Intravenous, Q6H PRN, Rise Patience, MD .  oxyCODONE (Oxy IR/ROXICODONE) immediate release tablet 5 mg, 5 mg, Oral, Q6H PRN, Rise Patience, MD, 5 mg at 09/01/18 1817 .  sodium chloride flush (NS) 0.9 % injection 10-40 mL, 10-40 mL, Intracatheter, PRN, Rise Patience, MD, 10 mL at 09/01/18 0403:  . amLODipine  5 mg Oral Daily  . budesonide (PULMICORT) nebulizer solution  0.25 mg Nebulization BID  . docusate sodium  100 mg Oral BID  . dronabinol  2.5 mg Oral q morning - 10a  . enoxaparin (LOVENOX) injection  20 mg Subcutaneous Q24H  . feeding supplement (ENSURE ENLIVE)  237 mL Oral BID BM  . ipratropium  0.5 mg Nebulization Q6H  . levalbuterol  1.25 mg Nebulization Q6H  . methylPREDNISolone (SOLU-MEDROL) injection  40 mg Intravenous Q12H  . nicotine  14 mg Transdermal Daily  :  No Known Allergies:  Family History  Problem Relation Age of Onset  . Hypertension Mother   . Diabetes Father   . Hypertension Brother   . HIV Brother   :  Social History   Socioeconomic History  . Marital status: Single    Spouse name: Not on file  . Number of children: Not on file  . Years of education: Not on file  . Highest education level: Not on file  Occupational History  . Not on file   Social Needs  . Financial resource strain: Not on file  . Food insecurity:    Worry: Not on file    Inability: Not on file  . Transportation needs:    Medical: Not on file    Non-medical: Not on file  Tobacco Use  . Smoking status: Current Some Day Smoker    Packs/day: 0.50    Types: Cigarettes    Last attempt to quit: 06/15/2018    Years since quitting: 0.2  . Smokeless tobacco: Never Used  Substance and Sexual Activity  . Alcohol use: No    Alcohol/week: 0.0 standard drinks  . Drug use: Yes    Comment: marijuana denies at preop  . Sexual  activity: Not Currently  Lifestyle  . Physical activity:    Days per week: Not on file    Minutes per session: Not on file  . Stress: Not on file  Relationships  . Social connections:    Talks on phone: Not on file    Gets together: Not on file    Attends religious service: Not on file    Active member of club or organization: Not on file    Attends meetings of clubs or organizations: Not on file    Relationship status: Not on file  . Intimate partner violence:    Fear of current or ex partner: Not on file    Emotionally abused: Not on file    Physically abused: Not on file    Forced sexual activity: Not on file  Other Topics Concern  . Not on file  Social History Narrative   Denies hx of drug use   Single   1 daughter age 86 lives with daughter and grandson who is 55.   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  :  Review of Systems  Constitutional: Positive for malaise/fatigue and weight loss.  HENT: Negative.   Eyes: Negative.   Respiratory: Positive for shortness of breath and wheezing.   Cardiovascular: Negative.   Gastrointestinal: Positive for abdominal pain.  Genitourinary: Negative.   Musculoskeletal: Positive for myalgias.  Skin: Negative.   Neurological: Negative.   Endo/Heme/Allergies: Negative.   Psychiatric/Behavioral: Negative.      Exam: Very thin African-American  female in no obvious distress.  Head neck exam shows no ocular or oral lesions.  She has some temporal muscle wasting.  There is no adenopathy in the neck.  Lungs show decent breath sounds bilaterally.  She has wheezes bilaterally.  Cardiac exam regular rate and rhythm.  She has no murmurs rubs or bruits.  Abdomen is soft.  Abdomen is without fluid wave.  There is no tenderness to palpation.  There is no abdominal mass.  There is no palpable liver or spleen tip.  Extremities shows symmetric muscle atrophy in upper and lower extremities.  Neurological exam is nonfocal. Patient Vitals for the past 24 hrs:  BP Temp Temp src Pulse Resp SpO2  09/02/18 0806 - - - - - 100 %  09/02/18 0759 - - - - - 100 %  09/02/18 0500 (!) 148/94 98.2 F (36.8 C) Oral 91 18 100 %  09/01/18 2133 (!) 141/83 98.5 F (36.9 C) Oral 87 18 98 %  09/01/18 2133 - - - - - 98 %  09/01/18 1700 - - - - - 98 %  09/01/18 1550 - - - - - 98 %  09/01/18 1421 130/89 98.3 F (36.8 C) Oral 89 15 98 %     Recent Labs    08/31/18 2211 09/01/18 0402  WBC 5.0 3.5*  HGB 10.3* 10.7*  HCT 32.6* 34.1*  PLT 168 185   Recent Labs    08/31/18 1459 08/31/18 2211 09/01/18 0402  NA 140  --  141  K 4.3  --  4.5  CL 105  --  105  CO2 25  --  28  GLUCOSE 107*  --  113*  BUN 11  --  15  CREATININE 0.72 0.97 0.90  CALCIUM 9.5  --  9.4    Blood smear review: None  Pathology: None    Assessment and Plan: Ms. Mackie is a very nice 63 year old African-American female.  She had locally advanced bladder cancer.  She was treated with radiation and chemotherapy.  She did have treatment interrupted because of her COPD.  She is admitted with COPD exacerbation.  However, she has recurrent/persistent bladder cancer.  I will have to speak to the urologist to see if he did a biopsy.  I hope that he did.  If not, then maybe he can do a cystoscopy while she is in the hospital so that we can get tissue to send off for molecular studies.  We  will plan our treatment protocol depending on what we can find with our molecular studies.  We may utilize single agent immunotherapy.  I know that the FDA recently approved a new agent for bladder cancer.  I am unsure if we could use this as the FDA approval might be based upon her having passed chemotherapy.  We will watch her labs closely while in the hospital.  We will see about getting a CT scan of her abdomen and pelvis while she is in the hospital as part of staging for the bladder cancer.  I would also get an echocardiogram on her to make sure that she has adequate cardiac function if we are to treat her for the bladder cancer.  She is well aware that her bladder cancer has recurred.  She would be willing to take therapy.  I appreciate everybody's help with Ms. Co on [address].  I know that the staff on [address] will do a fantastic job in caring for her.  Lattie Haw, MD  Michaelyn Barter 2:10

## 2018-09-02 NOTE — Progress Notes (Addendum)
PROGRESS NOTE  SPIRIT WERNLI  GYI:[id number] DOB: Mar 15, 1956 DOA: 08/31/2018 PCP: [name], NP   Brief Narrative: Donna Curry is a 63 y.o. female with a history of COPD, recent HCAP, and bladder CA who presented to [facility] ED with dyspnea worsening for 3 days associated with cough and wheezing on 2/7. In the ED she was treated for COPD exacerbation with limited improvement. Had a tachycardic episode with acute worsening of dyspnea though ECG had not changed when compared to priors and CTA chest showed no acute findings. Due to ongoing hypoxia, she was admitted to [facility].   Assessment & Plan: Principal Problem:   Acute on chronic respiratory failure with hypoxia (HCC) Active Problems:   Essential hypertension   Emphysema of lung (HCC)   Acute respiratory distress   Acute respiratory failure with hypoxia (HCC)  Acute hypoxic respiratory failure, centrilobular emphysema, prolonged tobacco use: Suspected to be due to COPD exacerbation. Severe emphysema on CTA chest without other acute finding, per my personal interpretation of images 2/8. Wheezing improved with steroids and nebulizer therapies.  - Troponins low/flat. Echocardiogram pending.  - Continue supplemental oxygen to maintain SpO2 88-95% - Needs pulmonology follow up, has seen Dr. Lake Bells, ambulatory pulse oximetry prior to discharge. Wheezing controlled, anticipate DC in next 24 hours with or without oxygen. Wean to 40mg  steroids po starting 2/10. - No infiltrate or increased sputum, but severity such that she is admitted and will therefore given azithromycin. - Added antitussives prn  Low TSH: TSH 0.125 though free T4 is within normal limits. No goiter on exam, not taking biotin. - Recommend rechecking in 4-8 weeks.  Bladder CA, muscle invasive, nonmetastatic: Follows with urology, [name], and oncology, [name]. Completed radiation/low-dose weekly cis-platinum (6 cycles) on 04/05/2018 - [name] requests staging  CT abd/pelvis and echo pending initiation of therapy.  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 Will continue prn IV iron per oncology  Severe protein-calorie malnutrition: BMI 12, worsened with chemotherapy and malignancy.  - Dietitian consulted - Building control surveyor (outpatient med)  HTN:  - Continue norvasc  Sinus tachycardia: Beta agonists contributing.  - Consider beta blocker  DVT prophylaxis: Lovenox 20mg  (still a little over 0.5mg /kg) q24h Code Status: Full Family Communication: Friend at bedside Disposition Plan: Continue IV steroids with plan to transition to po 2/10 if stable and discharge with or without O2.    Consultants:   None  Procedures:   None  Antimicrobials:  None   Subjective: Still severely short of breath but feels improvement in wheezing. Hacking cough kept her up all night. No chest pain.   Objective: Vitals:   09/02/18 0500 09/02/18 0759 09/02/18 0806 09/02/18 1247  BP: (!) 148/94   129/79  Pulse: 91   93  Resp: 18   20  Temp: 98.2 F (36.8 C)   97.8 F (36.6 C)  TempSrc: Oral   Oral  SpO2: 100% 100% 100% 100%  Weight:      Height:        Intake/Output Summary (Last 24 hours) at 09/02/2018 1345 Last data filed at 09/02/2018 0500 Gross per 24 hour  Intake 240 ml  Output 300 ml  Net -60 ml   Filed Weights   08/31/18 1317  Weight: 35.4 kg   Gen: Thin, frail female in no distress Pulm: Nonlabored but prolonged expiratory phase. No wheezing today.  CV: Regular rate and rhythm. No murmur, rub, or  gallop. No JVD, no dependent edema. GI: Abdomen soft, non-tender, non-distended, with normoactive bowel sounds.  Ext: Warm, no deformities Skin: No new rashes, lesions or ulcers on visualized skin. Neuro: Alert and oriented. No focal neurological deficits. Psych: Judgement and insight appear fair. Mood euthymic & affect congruent. Behavior is appropriate.    Data  Reviewed: I have personally reviewed following labs and imaging studies  CBC: Recent Labs  Lab 08/31/18 1459 08/31/18 2211 09/01/18 0402  WBC 6.3 5.0 3.5*  NEUTROABS 3.5  --   --   HGB 12.8 10.3* 10.7*  HCT 41.4 32.6* 34.1*  MCV 97.9 98.2 96.3  PLT 206 168 509   Basic Metabolic Panel: Recent Labs  Lab 08/31/18 1459 08/31/18 2211 09/01/18 0402  NA 140  --  141  K 4.3  --  4.5  CL 105  --  105  CO2 25  --  28  GLUCOSE 107*  --  113*  BUN 11  --  15  CREATININE 0.72 0.97 0.90  CALCIUM 9.5  --  9.4  MG  --  2.5*  --    GFR: Estimated Creatinine Clearance: 36.2 mL/min (by C-G formula based on SCr of 0.9 mg/dL). Liver Function Tests: No results for input(s): AST, ALT, ALKPHOS, BILITOT, PROT, ALBUMIN in the last 168 hours. No results for input(s): LIPASE, AMYLASE in the last 168 hours. No results for input(s): AMMONIA in the last 168 hours. Coagulation Profile: No results for input(s): INR, PROTIME in the last 168 hours. Cardiac Enzymes: Recent Labs  Lab 08/31/18 1459 08/31/18 2211 09/01/18 0657 09/01/18 1210  TROPONINI <0.03 0.03* 0.03* <0.03   BNP (last 3 results) No results for input(s): PROBNP in the last 8760 hours. HbA1C: No results for input(s): HGBA1C in the last 72 hours. CBG: No results for input(s): GLUCAP in the last 168 hours. Lipid Profile: No results for input(s): CHOL, HDL, LDLCALC, TRIG, CHOLHDL, LDLDIRECT in the last 72 hours. Thyroid Function Tests: Recent Labs    08/31/18 2211 09/01/18 0808  TSH 0.125*  --   FREET4  --  0.94  T3FREE  --  2.0   Anemia Panel: No results for input(s): VITAMINB12, FOLATE, FERRITIN, TIBC, IRON, RETICCTPCT in the last 72 hours. Urine analysis:    Component Value Date/Time   COLORURINE YELLOW 08/06/2018 1200   APPEARANCEUR CLEAR 08/06/2018 1200   LABSPEC 1.010 08/06/2018 1200   PHURINE 7.0 08/06/2018 1200   GLUCOSEU NEGATIVE 08/06/2018 1200   HGBUR SMALL (A) 08/06/2018 1200   BILIRUBINUR NEGATIVE  08/06/2018 1200   BILIRUBINUR 1+ 11/30/2017 1348   KETONESUR NEGATIVE 08/06/2018 1200   PROTEINUR 100 (A) 08/06/2018 1200   UROBILINOGEN 2.0 (A) 11/30/2017 1348   NITRITE NEGATIVE 08/06/2018 1200   LEUKOCYTESUR SMALL (A) 08/06/2018 1200   Recent Results (from the past 240 hour(s))  Respiratory Panel by PCR     Status: None   Collection Time: 09/01/18  6:28 AM  Result Value Ref Range Status   Adenovirus NOT DETECTED NOT DETECTED Final   Coronavirus 229E NOT DETECTED NOT DETECTED Final    Comment: (NOTE) The Coronavirus on the Respiratory Panel, DOES NOT test for the novel  Coronavirus (2019 nCoV)    Coronavirus HKU1 NOT DETECTED NOT DETECTED Final   Coronavirus NL63 NOT DETECTED NOT DETECTED Final   Coronavirus OC43 NOT DETECTED NOT DETECTED Final   Metapneumovirus NOT DETECTED NOT DETECTED Final   Rhinovirus / Enterovirus NOT DETECTED NOT DETECTED Final   Influenza A NOT DETECTED NOT  DETECTED Final   Influenza B NOT DETECTED NOT DETECTED Final   Parainfluenza Virus 1 NOT DETECTED NOT DETECTED Final   Parainfluenza Virus 2 NOT DETECTED NOT DETECTED Final   Parainfluenza Virus 3 NOT DETECTED NOT DETECTED Final   Parainfluenza Virus 4 NOT DETECTED NOT DETECTED Final   Respiratory Syncytial Virus NOT DETECTED NOT DETECTED Final   Bordetella pertussis NOT DETECTED NOT DETECTED Final   Chlamydophila pneumoniae NOT DETECTED NOT DETECTED Final   Mycoplasma pneumoniae NOT DETECTED NOT DETECTED Final    Comment: Performed at [facility], [place] [address] [place], [place] [address]      Radiology Studies: Dg Chest 2 View  Result Date: 08/31/2018 CLINICAL DATA:  Shortness of breath and cough for 2 weeks. History of bladder cancer. EXAM: CHEST - 2 VIEW COMPARISON:  CT chest and chest radiograph August 06, 2018 FINDINGS: Cardiomediastinal silhouette is normal. Calcified aortic arch. Chronic interstitial changes and increased lung volumes with flattened hemidiaphragms. No pleural  effusion or focal consolidation. No pneumothorax. Single-lumen RIGHT chest Port-A-Cath distal tip projects in mid superior vena cava. Patient is cachectic. Osseous structures are nonacute. Thoracolumbar mild dextroscoliosis. IMPRESSION: 1. No acute cardiopulmonary process. 2.  Emphysema (ICD10-J43.9). 3.  Aortic Atherosclerosis (ICD10-I70.0). Electronically Signed   By: Elon Alas M.D.   On: 08/31/2018 14:35   Ct Angio Chest Pe W And/or Wo Contrast  Result Date: 08/31/2018 CLINICAL DATA:  Patient is a 63 year old female with a history of hypertensive cardiomyopathy without failure, TIA, bladder cancer status post surgery who is not getting current chemotherapy, healthcare associated pneumonia and COPD presenting today with persistent shortness of breath. Patient states that for the last month or 2 she has had worsening dyspnea with exertion. She has had a cough productive of mucus intermittently and states she wakes up drenched in sweat but denies known fever. She does have inhalers she is using at home but does not feel those significantly improve her shortness of breath. Sitting down and resting resolves her shortness of breath. She never has chest pain with this and symptoms are usually better if she lies down. EXAM: CT ANGIOGRAPHY CHEST WITH CONTRAST TECHNIQUE: Multidetector CT imaging of the chest was performed using the standard protocol during bolus administration of intravenous contrast. Multiplanar CT image reconstructions and MIPs were obtained to evaluate the vascular anatomy. CONTRAST:  32mL ISOVUE-370 IOPAMIDOL (ISOVUE-370) INJECTION 76% COMPARISON:  Current chest radiograph.  CTA chest, 08/06/2018. FINDINGS: Cardiovascular: There is satisfactory opacification of the pulmonary arteries to the segmental level. There is no evidence of a pulmonary embolism. Heart is normal in size and configuration. No pericardial effusion. There are three-vessel coronary artery calcifications. Ascending aorta at  is prominent measuring 3.7 cm in diameter. There is aortic atherosclerosis extending to the origin of the branch vessels, without significant stenosis. No aortic dissection. Mediastinum/Nodes: No neck base, axillary, mediastinal or hilar masses or enlarged lymph nodes. Trachea and esophagus are unremarkable. Lungs/Pleura: Advanced emphysema. Mild scarring or atelectasis or a combination at the lung bases. No pleural effusion or pneumothorax. Upper Abdomen: No acute abnormality. Musculoskeletal: No fracture or acute finding. No osteoblastic or osteolytic lesions. Review of the MIP images confirms the above findings. IMPRESSION: 1. No evidence of a pulmonary embolism. 2. No acute findings. 3. Advanced emphysema.  Mild lung base scarring and/or atelectasis. 4. Aortic atherosclerosis.  Coronary artery calcifications. Aortic Atherosclerosis (ICD10-I70.0) and Emphysema (ICD10-J43.9). Electronically Signed   By: Lajean Manes M.D.   On: 08/31/2018 16:17  Scheduled Meds: . amLODipine  5 mg Oral Daily  . budesonide (PULMICORT) nebulizer solution  0.25 mg Nebulization BID  . docusate sodium  100 mg Oral BID  . dronabinol  2.5 mg Oral q morning - 10a  . enoxaparin (LOVENOX) injection  20 mg Subcutaneous Q24H  . feeding supplement (ENSURE ENLIVE)  237 mL Oral BID BM  . ipratropium  0.5 mg Nebulization Q6H  . levalbuterol  1.25 mg Nebulization Q6H  . methylPREDNISolone (SOLU-MEDROL) injection  40 mg Intravenous Q12H  . nicotine  14 mg Transdermal Daily   Continuous Infusions:   LOS: 1 day   Time spent: 25 minutes.  Patrecia Pour, MD [facility] [email or web address] Password [email or web address] 09/02/2018, 1:45 PM

## 2018-09-03 ENCOUNTER — Inpatient Hospital Stay (HOSPITAL_COMMUNITY): Payer: Medicare Other

## 2018-09-03 DIAGNOSIS — E43 Unspecified severe protein-calorie malnutrition: Secondary | ICD-10-CM

## 2018-09-03 MED ORDER — PREDNISONE 20 MG PO TABS: 40.0000 mg | ORAL_TABLET | Freq: Every day | ORAL | 0 refills | Status: AC

## 2018-09-03 MED ORDER — ALBUTEROL SULFATE (2.5 MG/3ML) 0.083% IN NEBU: 2.5000 mg | INHALATION_SOLUTION | RESPIRATORY_TRACT | 0 refills | Status: DC | PRN

## 2018-09-03 MED ORDER — SODIUM CHLORIDE 0.9 % IV SOLN
INTRAVENOUS | Status: AC
  Filled 2018-09-03: qty 250

## 2018-09-03 MED ORDER — IOHEXOL 300 MG/ML  SOLN
75.0000 mL | Freq: Once | INTRAMUSCULAR | Status: AC | PRN
  Administered 2018-09-03: 75 mL via INTRAVENOUS

## 2018-09-03 MED ORDER — BENZONATATE 100 MG PO CAPS: 100.0000 mg | ORAL_CAPSULE | Freq: Three times a day (TID) | ORAL | Status: DC | PRN

## 2018-09-03 MED ORDER — AZITHROMYCIN 250 MG PO TABS: 250.0000 mg | ORAL_TABLET | Freq: Every day | ORAL | 0 refills | Status: DC

## 2018-09-03 MED ORDER — IOHEXOL 300 MG/ML  SOLN: 15.0000 mL | Freq: Once | INTRAMUSCULAR | Status: DC | PRN

## 2018-09-03 MED ORDER — HEPARIN SOD (PORK) LOCK FLUSH 100 UNIT/ML IV SOLN
500.0000 [IU] | INTRAVENOUS | Status: AC | PRN
  Administered 2018-09-03: 500 [IU]

## 2018-09-03 MED ORDER — SODIUM CHLORIDE (PF) 0.9 % IJ SOLN
INTRAMUSCULAR | Status: AC
  Administered 2018-09-03: 11:00:00
  Filled 2018-09-03: qty 50

## 2018-09-03 NOTE — Progress Notes (Signed)
Physical Therapy Treatment Patient Details Name: Donna Curry MRN: [id number] DOB: 12-02-1955 Today's Date: 09/03/2018    SATURATION QUALIFICATIONS: (This note is used to comply with regulatory documentation for home oxygen)  Patient Saturations on Room Air at Rest = 95%  Patient Saturations on Room Air while Ambulating = 89%     History of Present Illness 63 yo female admitted with COPD exac. Hx of COPD, bladder ca, TIA    PT Comments    Progressing with mobility.  Pt continues to fatigue fairly easily. Cues for pursed lip/deep breathing during session. Increased activity tolerance compared to yesterday.   Follow Up Recommendations  Home health PT;Supervision - Intermittent     Equipment Recommendations  None recommended by PT    Recommendations for Other Services       Precautions / Restrictions Precautions Precautions: Fall Precaution Comments: monitor O2 Restrictions Weight Bearing Restrictions: No    Mobility  Bed Mobility Overal bed mobility: Modified Independent                Transfers Overall transfer level: Needs assistance   Transfers: Sit to/from Stand Sit to Stand: Supervision            Ambulation/Gait Ambulation/Gait assistance: Min guard Gait Distance (Feet): 50 Feet(50'x1, 60'x1) Assistive device: None Gait Pattern/deviations: Step-through pattern;Drifts right/left     General Gait Details: Close guard for safety. O2 sat 89% on RA, dyspnea 2/4. One seated rest break.    Stairs             Wheelchair Mobility    Modified Rankin (Stroke Patients Only)       Balance Overall balance assessment: Mild deficits observed, not formally tested                                          Cognition Arousal/Alertness: Awake/alert Behavior During Therapy: WFL for tasks assessed/performed Overall Cognitive Status: Within Functional Limits for tasks assessed                                        Exercises      General Comments        Pertinent Vitals/Pain Pain Assessment: No/denies pain    Home Living                      Prior Function            PT Goals (current goals can now be found in the care plan section) Progress towards PT goals: Progressing toward goals    Frequency    Min 3X/week      PT Plan Current plan remains appropriate    Co-evaluation              AM-PAC PT "6 Clicks" Mobility   Outcome Measure  Help needed turning from your back to your side while in a flat bed without using bedrails?: None Help needed moving from lying on your back to sitting on the side of a flat bed without using bedrails?: None Help needed moving to and from a bed to a chair (including a wheelchair)?: A Little Help needed standing up from a chair using your arms (e.g., wheelchair or bedside chair)?: A Little Help needed to walk in hospital room?: A Little Help  needed climbing 3-5 steps with a railing? : A Little 6 Click Score: 20    End of Session Equipment Utilized During Treatment: Gait belt Activity Tolerance: Patient tolerated treatment well Patient left: in bed;with call bell/phone within reach;with family/visitor present   PT Visit Diagnosis: Unsteadiness on feet (R26.81);Muscle weakness (generalized) (M62.81);Difficulty in walking, not elsewhere classified (R26.2)     Time: 3668-1594 PT Time Calculation (min) (ACUTE ONLY): 19 min  Charges:  $Gait Training: 8-22 mins                        Weston Anna, PT Acute Rehabilitation Services Pager: [phone] Office: [phone]

## 2018-09-03 NOTE — Progress Notes (Addendum)
Initial Nutrition Assessment  DOCUMENTATION CODES:   Severe malnutrition in context of chronic illness  INTERVENTION:  - Continue Ensure Enlive po BID, each supplement provides 350 kcal and 20 grams of protein - Encouraged PO intake of snacks and meals  NUTRITION DIAGNOSIS:   Severe Malnutrition related to chronic illness(COPD, bladder cancer) as evidenced by severe fat depletion, severe muscle depletion.  GOAL:   Patient will meet greater than or equal to 90% of their needs  MONITOR:   PO intake, Supplement acceptance, Weight trends  REASON FOR ASSESSMENT:   Consult Poor PO  ASSESSMENT:   Pt with PMH of hypertensive cardiomyopathy, TIA, bladder cancer s/p surgery not currently getting chemo, hospital acquired PNA, and COPD. She received radiation and low dose cis-platinum; completed treatment in Sept. 2019. Being followed by [name] for the bladder cancer; had cystoscopy done 02/07.  Spoke with pt at bedside, she was in very good spirits. Family and friends were present.   Pt reported having 2-3 meals a day, with snacks often. For breakfast she may have grits, eggs, cranberry juice or water. Her next meal is often dinner which are often home cooked meals. She eats fruits throughout the day and enjoys snacking on left overs. She reported she does not like junk food (chips, crackers). Her appetite is okay, she mentioned that it can be good one day and bad the next. Pt is experiencing taste changes which has impacted her appetite. She denies becoming satiated quickly, but says she does not eat past feeling full. Per pt chart, meal completion is 100%. Pt also endorsed eating 100% of her meals. Denies any n/v or abdominal pains.  Pt taking Marinol PTA as outpatient; she mentioned that she thinks it helps sometimes but not every day.  Pt reported UBW 110#, was unable to recall the last time she weighed that. Current wt is 78#. Pt experiences SOB when moving around her home. Family  reported she experiences episodes of wheezing when ambulating. NFPE exam revealed severe muscle and severe fat depletion; pt meets criteria for severe malnutrition d/t chronic illness.  Recommend continuing Ensure BID, pt was receptive and reported drinking Ensure 4x a day at home. Despite being receptive to the Ensure pt has refused it per order notes. Encouraged pt to have many small meals throughout the day if large ones overwhelm her and to continue snacking on items.   Medications reviewed and include: Solu-Medrol 40mg  q 12hrs, Marinol 2.5mg  every morning Labs reviewed: K 4.5 (WNL), Na 141 (WNL)  NUTRITION - FOCUSED PHYSICAL EXAM:    Most Recent Value  Orbital Region  Severe depletion  Upper Arm Region  Severe depletion  Thoracic and Lumbar Region  Severe depletion  Buccal Region  Severe depletion  Temple Region  Severe depletion  Clavicle Bone Region  Severe depletion  Clavicle and Acromion Bone Region  Severe depletion  Scapular Bone Region  Severe depletion  Dorsal Hand  Severe depletion  Patellar Region  Severe depletion  Anterior Thigh Region  Severe depletion  Posterior Calf Region  Severe depletion  Edema (RD Assessment)  None  Hair  Reviewed  Eyes  Reviewed  Mouth  Reviewed  Skin  Reviewed  Nails  Reviewed       Diet Order:   Diet Order            Diet regular Room service appropriate? Yes; Fluid consistency: Thin  Diet effective now  EDUCATION NEEDS:   No education needs have been identified at this time  Skin:  Skin Assessment: Reviewed RN Assessment  Last BM:     Height:   Ht Readings from Last 1 Encounters:  08/31/18 5\' 5"  (1.651 m)    Weight:   Wt Readings from Last 1 Encounters:  08/31/18 35.4 kg    Ideal Body Weight:  56.81 kg  BMI:  Body mass index is 12.98 kg/m.  Estimated Nutritional Needs:   Kcal:  1200-1400 kcal  Protein:  50-60 g  Fluid:  >/= 1.2 L    [employer]

## 2018-09-03 NOTE — Progress Notes (Signed)
Noted patient did not qualify for home 02. No further CM needs.

## 2018-09-03 NOTE — Discharge Summary (Signed)
Physician Discharge Summary  Donna Curry WNI:[id number] DOB: 1955/11/04 DOA: 08/31/2018  PCP: [name], NP  Admit date: 08/31/2018 Discharge date: 09/03/2018  Admitted From: Home Disposition: Home   Recommendations for Outpatient Follow-up:  1. Follow up with PCP in 1-2 weeks 2. Follow up with oncology as previously planned. Seen by [name] while admitted. 3. Follow up with pulmonology in the next 1-2 weeks. 4. Please obtain BMP/CBC in one week  Home Health: PT, OT, RN Equipment/Devices: Nebulizer with meds. Pt did not qualify for oxygen based on ambulatory pulse oximetry. Discharge Condition: Stable CODE STATUS: Full Diet recommendation: Heart healthy  Brief/Interim Summary: Donna Curry is a 63 y.o. female with a history of COPD, recent HCAP, and bladder CA who presented to [facility] ED with dyspnea worsening for 3 days associated with cough and wheezing on 2/7. In the ED she was treated for COPD exacerbation with limited improvement. Had a tachycardic episode with acute worsening of dyspnea though ECG had not changed when compared to priors and CTA chest showed no acute findings. Due to ongoing hypoxia, she was admitted to [facility]. Steroids, nebulizers, and azithromycin were given with improvement. On the day of discharge she has ambulated on room air and does not qualify for home oxygen.   Discharge Diagnoses:  Principal Problem:   Acute on chronic respiratory failure with hypoxia (HCC) Active Problems:   Essential hypertension   Emphysema of lung (HCC)   Acute respiratory distress   Acute respiratory failure with hypoxia (HCC)   Protein-calorie malnutrition, severe  Acute hypoxic respiratory failure, centrilobular emphysema, prolonged tobacco use, COPD exacerbation: Severe emphysema on CTA chest without other acute finding, per my personal interpretation of images 2/8. Troponins low/flat with no wall motion abnormalities or LV dysfunction on echocardiogram.   -  Wheezing has resolved for >48 hours with SpO2 remaining 89% on room air with ambulation, not hypoxic at rest. Suspect based on severity of lung findings without acute findings that she will require oxygen going forward. Was planning to discharge on supplemental oxygen. - Needs pulmonology follow up, has seen Dr. Lake Bells.  ambulatory pulse oximetry prior to discharge.  - Weaned to 40mg  steroids po starting 2/10. - Continue 5 days azithromycin - Will give home health RN, PT, OT, and supply nebulizer for home use.   Low TSH: TSH 0.125 though free T4 and T3 within normal limits. No goiter on exam, not taking biotin. - Recommend rechecking in 4-8 weeks.  Bladder CA, muscle invasive, nonmetastatic: Follows with urology, [name], and oncology, [name]. Completed radiation/low-dose weekly cis-platinum (6 cycles) on 04/05/2018 - Staging CT abd/pelvis performed per oncology. 2.4 x 2.9 cm necrotic mass along the right anterolateral bladder wall, stable versus mildly increased from prior PET, likely reflecting residual bladder tumor. No other findings suspicious for metastatic disease in the abdomen/pelvis. and echo pending initiation of therapy.  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 Will continue prn IV iron per oncology  Severe protein-calorie malnutrition: BMI 12, worsened with chemotherapy and malignancy.  - Dietitian consulted - Continue marinol (outpatient med)  HTN:  - Continue norvasc  Sinus tachycardia: Beta agonists contributing, improving.  Discharge Instructions Discharge Instructions    Discharge instructions   Complete by:  As directed    You were admitted for a COPD exacerbation and are stable for discharge with the following recommendations:  - Continue taking azithromycin (antibiotic) and prednisone (steroid) for the next  3 days starting tomorrow. - Use oxygen when walking - Use the  nebulizer we are providing for you. You will use albuterol every 4 hours as needed for wheezing or shortness of breath. - Follow up with the lung doctor, Dr. Lake Bells as soon as possible for follow up of your emphysema and COPD.  - Avoid smoking - Follow up with [name] and urology in the next 1-2 weeks - If your symptoms worsen, seek medical attention right away.   Increase activity slowly   Complete by:  As directed      Allergies as of 09/03/2018   No Known Allergies     Medication List    TAKE these medications   acetaminophen 500 MG tablet Commonly known as:  TYLENOL Take 1,000 mg by mouth every 8 (eight) hours as needed for moderate pain.   albuterol 108 (90 Base) MCG/ACT inhaler Commonly known as:  PROVENTIL HFA;VENTOLIN HFA Inhale 2 puffs into the lungs every 6 (six) hours as needed for wheezing or shortness of breath. What changed:  Another medication with the same name was added. Make sure you understand how and when to take each.   albuterol (2.5 MG/3ML) 0.083% nebulizer solution Commonly known as:  PROVENTIL Take 3 mLs (2.5 mg total) by nebulization every 4 (four) hours as needed for wheezing or shortness of breath. What changed:  You were already taking a medication with the same name, and this prescription was added. Make sure you understand how and when to take each.   amLODipine 10 MG tablet Commonly known as:  NORVASC Take 5 mg by mouth daily.   aspirin EC 81 MG tablet Take 81 mg by mouth daily as needed (with blood pressure medication, as directed.).   azithromycin 250 MG tablet Commonly known as:  ZITHROMAX Take 1 tablet (250 mg total) by mouth daily.   budesonide-formoterol 160-4.5 MCG/ACT inhaler Commonly known as:  SYMBICORT Inhale 2 puffs into the lungs 2 (two) times daily.   docusate sodium 100 MG capsule Commonly known as:  COLACE Take 1 capsule (100 mg total) by mouth 2 (two) times daily.   ergocalciferol 1.25 MG (50000 UT)  capsule Commonly known as:  VITAMIN D2 Take 1 capsule (50,000 Units total) by mouth once a week.   Ipratropium-Albuterol 20-100 MCG/ACT Aers respimat Commonly known as:  COMBIVENT Inhale 1 puff into the lungs every 6 (six) hours.   lidocaine-prilocaine cream Commonly known as:  EMLA Apply to affected area once What changed:    how much to take  how to take this  when to take this  reasons to take this  additional instructions   MARINOL 2.5 MG capsule Generic drug:  dronabinol TAKE 1 CAPSULE BY MOUTH TWICE DAILY BEFORE A MEAL What changed:  See the new instructions.   megestrol 625 MG/5ML suspension Commonly known as:  MEGACE ES Take 5 mLs (625 mg total) by mouth daily.   nicotine 14 mg/24hr patch Commonly known as:  NICODERM CQ - dosed in mg/24 hours Place 1 patch (14 mg total) onto the skin daily.   ondansetron 8 MG tablet Commonly known as:  ZOFRAN Take 1 tablet (8 mg total) by mouth 2 (two) times daily as needed. Start on the third day after chemotherapy. What changed:  reasons to take this   oxyCODONE 5 MG immediate release tablet Commonly known as:  Oxy IR/ROXICODONE Take 1 tablet (5 mg total) by mouth every 6 (six) hours as needed for severe pain.   predniSONE  20 MG tablet Commonly known as:  DELTASONE Take 2 tablets (40 mg total) by mouth daily for 3 days.   varenicline 0.5 MG X 11 & 1 MG X 42 tablet Commonly known as:  CHANTIX PAK Take one 0.5 mg tablet daily x 3 days, then increase to one 0.5 mg tab twice daily x 4 days, then increase to one 1 mg tablet twice daily.            Durable Medical Equipment  (From admission, onward)         Start     Ordered   09/03/18 1059  For home use only DME oxygen  Once    Question Answer Comment  Mode or (Route) Nasal cannula   Liters per Minute 2   Frequency Continuous (stationary and portable oxygen unit needed)   Oxygen delivery system Gas      09/03/18 1059   09/03/18 1059  For home use only DME  Nebulizer/meds  Once    Question Answer Comment  Patient needs a nebulizer to treat with the following condition Emphysema of lung ([place])   Patient needs a nebulizer to treat with the following condition Acute respiratory failure with hypoxia ([place])      09/03/18 1059         Follow-up Information    [name], NP. Schedule an appointment as soon as possible for a visit in 2 week(s).   Specialty:  Internal Medicine Contact information: [place] [address] [place] [address] [phone]        Juanito Doom, MD. Schedule an appointment as soon as possible for a visit in 2 week(s).   Specialty:  Pulmonary Disease Contact information: [address] [place] [address] [phone]        Golda A[facility], [facility] Follow up.   Specialty:  Home Health Services Why:  [facility] nursing/physical therapy/occupational therpay,aide/social worker Contact information: [place] [address] [place] Follow up.   Why:  neb machine Contact information: [place] [address] [phone]          No Known Allergies  Consultations:  Oncology, [name]  Procedures/Studies: Dg Chest 2 View  Result Date: 08/31/2018 CLINICAL DATA:  Shortness of breath and cough for 2 weeks. History of bladder cancer. EXAM: CHEST - 2 VIEW COMPARISON:  CT chest and chest radiograph August 06, 2018 FINDINGS: Cardiomediastinal silhouette is normal. Calcified aortic arch. Chronic interstitial changes and increased lung volumes with flattened hemidiaphragms. No pleural effusion or focal consolidation. No pneumothorax. Single-lumen RIGHT chest Port-A-Cath distal tip projects in mid superior vena cava. Patient is cachectic. Osseous structures are nonacute. Thoracolumbar mild dextroscoliosis. IMPRESSION: 1. No acute cardiopulmonary process. 2.  Emphysema (ICD10-J43.9). 3.   Aortic Atherosclerosis (ICD10-I70.0). Electronically Signed   By: Elon Alas M.D.   On: 08/31/2018 14:35   Dg Chest 2 View  Result Date: 08/06/2018 CLINICAL DATA:  Shortness of breath. EXAM: CHEST - 2 VIEW COMPARISON:  PET CT 07/05/2018. Chest x-ray 02/23/2018. Chest x-ray 01/11/2018. Chest x-ray 11/03/2017. FINDINGS: Port-A-Cath noted with tip over superior vena cava. Heart size normal. Left nipple shadow again noted. Mild bibasilar pleural-parenchymal thickening again noted consistent scarring. IMPRESSION: 1. Port-A-Cath noted in stable position with tip over superior vena cava. 2. Basal pleural-parenchymal thickening most consistent with scarring again noted. No acute pulmonary disease  noted. Electronically Signed   By: [place]   On: 08/06/2018 11:04   Ct Angio Chest Pe W And/or Wo Contrast  Result Date: 08/31/2018 CLINICAL DATA:  Patient is a 63 year old female with a history of hypertensive cardiomyopathy without failure, TIA, bladder cancer status post surgery who is not getting current chemotherapy, healthcare associated pneumonia and COPD presenting today with persistent shortness of breath. Patient states that for the last month or 2 she has had worsening dyspnea with exertion. She has had a cough productive of mucus intermittently and states she wakes up drenched in sweat but denies known fever. She does have inhalers she is using at home but does not feel those significantly improve her shortness of breath. Sitting down and resting resolves her shortness of breath. She never has chest pain with this and symptoms are usually better if she lies down. EXAM: CT ANGIOGRAPHY CHEST WITH CONTRAST TECHNIQUE: Multidetector CT imaging of the chest was performed using the standard protocol during bolus administration of intravenous contrast. Multiplanar CT image reconstructions and MIPs were obtained to evaluate the vascular anatomy. CONTRAST:  52mL ISOVUE-370 IOPAMIDOL (ISOVUE-370) INJECTION  76% COMPARISON:  Current chest radiograph.  CTA chest, 08/06/2018. FINDINGS: Cardiovascular: There is satisfactory opacification of the pulmonary arteries to the segmental level. There is no evidence of a pulmonary embolism. Heart is normal in size and configuration. No pericardial effusion. There are three-vessel coronary artery calcifications. Ascending aorta at is prominent measuring 3.7 cm in diameter. There is aortic atherosclerosis extending to the origin of the branch vessels, without significant stenosis. No aortic dissection. Mediastinum/Nodes: No neck base, axillary, mediastinal or hilar masses or enlarged lymph nodes. Trachea and esophagus are unremarkable. Lungs/Pleura: Advanced emphysema. Mild scarring or atelectasis or a combination at the lung bases. No pleural effusion or pneumothorax. Upper Abdomen: No acute abnormality. Musculoskeletal: No fracture or acute finding. No osteoblastic or osteolytic lesions. Review of the MIP images confirms the above findings. IMPRESSION: 1. No evidence of a pulmonary embolism. 2. No acute findings. 3. Advanced emphysema.  Mild lung base scarring and/or atelectasis. 4. Aortic atherosclerosis.  Coronary artery calcifications. Aortic Atherosclerosis (ICD10-I70.0) and Emphysema (ICD10-J43.9). Electronically Signed   By: Lajean Manes M.D.   On: 08/31/2018 16:17   Ct Angio Chest Pe W And/or Wo Contrast  Result Date: 08/06/2018 CLINICAL DATA:  Shortness of breath and cough. EXAM: CT ANGIOGRAPHY CHEST WITH CONTRAST TECHNIQUE: Multidetector CT imaging of the chest was performed using the standard protocol during bolus administration of intravenous contrast. Multiplanar CT image reconstructions and MIPs were obtained to evaluate the vascular anatomy. CONTRAST:  161mL ISOVUE-370 IOPAMIDOL (ISOVUE-370) INJECTION 76% COMPARISON:  Chest x-ray dated 08/06/2018 and chest CT dated 11/12/2017 FINDINGS: Cardiovascular: Satisfactory opacification of the pulmonary arteries to the  segmental level. No evidence of pulmonary embolism. Normal heart size. No pericardial effusion. Aortic atherosclerosis. Mediastinum/Nodes: No enlarged mediastinal, hilar, or axillary lymph nodes. Thyroid gland, trachea, and esophagus demonstrate no significant findings. Lungs/Pleura: There are severe emphysematous changes bilaterally primarily in the upper lobes. There are tiny areas of focal infiltrate or atelectasis at both lung bases. No effusions. Upper Abdomen: No acute abnormality. Focal small areas of cortical atrophy of both kidneys, possibly due to remote infection or infarction. Musculoskeletal: The patient has very little body fat. No chest wall abnormality. No acute or significant osseous findings. Review of the MIP images confirms the above findings. IMPRESSION: No evidence of pulmonary emboli. Aortic Atherosclerosis (ICD10-I70.0) and Emphysema (ICD10-J43.9). Tiny areas of infiltrate at both lung  bases posterior medially. Electronically Signed   By: Lorriane Shire M.D.   On: 08/06/2018 12:04   Ct Abdomen Pelvis W Contrast  Result Date: 09/03/2018 CLINICAL DATA:  Bladder cancer, status post chemotherapy and XRT. Status post cystoscopy last week. EXAM: CT ABDOMEN AND PELVIS WITH CONTRAST TECHNIQUE: Multidetector CT imaging of the abdomen and pelvis was performed using the standard protocol following bolus administration of intravenous contrast. CONTRAST:  66mL OMNIPAQUE IOHEXOL 300 MG/ML  SOLN COMPARISON:  CTA chest dated 08/31/2018.  PET-CT dated 07/05/2018. FINDINGS: Lower chest: Lung bases are clear. Hepatobiliary: Liver is within normal limits. Gallbladder is unremarkable. No intrahepatic or extrahepatic ductal dilatation. Pancreas: Within normal limits. Spleen: Within normal limits. Adrenals/Urinary Tract: Stable thickening of the bilateral adrenal glands (series 2/image 111), without hypermetabolism on prior PET, likely reflecting adenomatous hyperplasia. Mild renal cortical scarring. Indwelling  right double-pigtail ureteral stent. No hydronephrosis. 2.4 x 2.9 cm necrotic mass along the right anterolateral bladder wall (series 2/image 59), previously 2.0 x 3.2 cm, likely reflecting residual tumor. Stomach/Bowel: Stomach is within normal limits. No evidence of bowel obstruction. Normal appendix (coronal image 38). No colonic wall thickening or inflammatory changes. Vascular/Lymphatic: No evidence of abdominal aortic aneurysm. Atherosclerotic calcifications of the abdominal aorta and branch vessels. No suspicious abdominopelvic lymphadenopathy. Reproductive: Uterus is unremarkable. No adnexal masses. Other: No abdominopelvic ascites. Musculoskeletal: Degenerative changes of the lumbar spine. IMPRESSION: 2.4 x 2.9 cm necrotic mass along the right anterolateral bladder wall, stable versus mildly increased from prior PET, likely reflecting residual bladder tumor. Stable thickening of the bilateral adrenal glands, without hypermetabolism on prior PET, likely benign. No findings suspicious for metastatic disease in the abdomen/pelvis. Electronically Signed   By: Julian Hy M.D.   On: 09/03/2018 10:40    Subjective: Dyspnea is improved but remains short of breath with exertion. However, desaturates only to 89% on room air with significant exertion and this apparently does not qualify for medicare covering home oxygen. She denies chest pain. Cough is stable. No gross hematuria.   Discharge Exam: Vitals:   09/03/18 0736 09/03/18 1337  BP:    Pulse:    Resp:    Temp:    SpO2: 99% 95%   General: Frail female appearing older than stated age. Cardiovascular: RRR, S1/S2 +, no rubs, no gallops Respiratory: Nonlabored, clear but diminished. No wheezing or expiratory prolongation. Abdominal: Soft, NT, ND, bowel sounds + Extremities: No edema, no cyanosis  Labs: BNP (last 3 results) No results for input(s): BNP in the last 8760 hours. Basic Metabolic Panel: Recent Labs  Lab 08/31/18 1459  08/31/18 2211 09/01/18 0402  NA 140  --  141  K 4.3  --  4.5  CL 105  --  105  CO2 25  --  28  GLUCOSE 107*  --  113*  BUN 11  --  15  CREATININE 0.72 0.97 0.90  CALCIUM 9.5  --  9.4  MG  --  2.5*  --    Liver Function Tests: No results for input(s): AST, ALT, ALKPHOS, BILITOT, PROT, ALBUMIN in the last 168 hours. No results for input(s): LIPASE, AMYLASE in the last 168 hours. No results for input(s): AMMONIA in the last 168 hours. CBC: Recent Labs  Lab 08/31/18 1459 08/31/18 2211 09/01/18 0402  WBC 6.3 5.0 3.5*  NEUTROABS 3.5  --   --   HGB 12.8 10.3* 10.7*  HCT 41.4 32.6* 34.1*  MCV 97.9 98.2 96.3  PLT 206 168 185   Cardiac Enzymes:  Recent Labs  Lab 08/31/18 1459 08/31/18 2211 09/01/18 0657 09/01/18 1210  TROPONINI <0.03 0.03* 0.03* <0.03   BNP: Invalid input(s): POCBNP CBG: No results for input(s): GLUCAP in the last 168 hours. D-Dimer No results for input(s): DDIMER in the last 72 hours. Hgb A1c No results for input(s): HGBA1C in the last 72 hours. Lipid Profile No results for input(s): CHOL, HDL, LDLCALC, TRIG, CHOLHDL, LDLDIRECT in the last 72 hours. Thyroid function studies Recent Labs    08/31/18 2211 09/01/18 0808  TSH 0.125*  --   T3FREE  --  2.0   Anemia work up No results for input(s): VITAMINB12, FOLATE, FERRITIN, TIBC, IRON, RETICCTPCT in the last 72 hours. Urinalysis    Component Value Date/Time   COLORURINE YELLOW 08/06/2018 1200   APPEARANCEUR CLEAR 08/06/2018 1200   LABSPEC 1.010 08/06/2018 1200   PHURINE 7.0 08/06/2018 1200   GLUCOSEU NEGATIVE 08/06/2018 1200   HGBUR SMALL (A) 08/06/2018 1200   BILIRUBINUR NEGATIVE 08/06/2018 1200   BILIRUBINUR 1+ 11/30/2017 1348   KETONESUR NEGATIVE 08/06/2018 1200   PROTEINUR 100 (A) 08/06/2018 1200   UROBILINOGEN 2.0 (A) 11/30/2017 1348   NITRITE NEGATIVE 08/06/2018 1200   LEUKOCYTESUR SMALL (A) 08/06/2018 1200    Microbiology Recent Results (from the past 240 hour(s))  Respiratory  Panel by PCR     Status: None   Collection Time: 09/01/18  6:28 AM  Result Value Ref Range Status   Adenovirus NOT DETECTED NOT DETECTED Final   Coronavirus 229E NOT DETECTED NOT DETECTED Final    Comment: (NOTE) The Coronavirus on the Respiratory Panel, DOES NOT test for the novel  Coronavirus (2019 nCoV)    Coronavirus HKU1 NOT DETECTED NOT DETECTED Final   Coronavirus NL63 NOT DETECTED NOT DETECTED Final   Coronavirus OC43 NOT DETECTED NOT DETECTED Final   Metapneumovirus NOT DETECTED NOT DETECTED Final   Rhinovirus / Enterovirus NOT DETECTED NOT DETECTED Final   Influenza A NOT DETECTED NOT DETECTED Final   Influenza B NOT DETECTED NOT DETECTED Final   Parainfluenza Virus 1 NOT DETECTED NOT DETECTED Final   Parainfluenza Virus 2 NOT DETECTED NOT DETECTED Final   Parainfluenza Virus 3 NOT DETECTED NOT DETECTED Final   Parainfluenza Virus 4 NOT DETECTED NOT DETECTED Final   Respiratory Syncytial Virus NOT DETECTED NOT DETECTED Final   Bordetella pertussis NOT DETECTED NOT DETECTED Final   Chlamydophila pneumoniae NOT DETECTED NOT DETECTED Final   Mycoplasma pneumoniae NOT DETECTED NOT DETECTED Final    Comment: Performed at [facility], [place]. [address], [place], [place] [address]    Time coordinating discharge: Approximately 40 minutes  Patrecia Pour, MD  [facility] 09/03/2018, 2:45 PM Pager [phone]

## 2018-09-03 NOTE — Care Management Note (Signed)
Case Management Note  Patient Details  Name: Donna Curry MRN: [id number] Date of Birth: 1955-11-25  Subjective/Objective: Awaiting 02 sats for qualifying for home 02. [facility] to deliver neb machine to rm prior d/c. [facility] rep Dorian Pod aware of [place] orders.                   Action/Plan:dc home w/HHC/dme   Expected Discharge Date:  09/03/18               Expected Discharge Plan:  [place]  In-House Referral:     Discharge planning Services  CM Consult  Post Acute Care Choice:  Home Health([facility] active-HH PT) Choice offered to:  Patient  DME Arranged:  Chiropodist DME Agency:  [place]:  RN, PT, OT, Nurse's Aide, Social Work [employer] Agency:  [facility]  Status of Service:  Completed, signed off  If discussed at [employer], dates discussed:    Additional Comments:  Dessa Phi, RN 09/03/2018, 11:41 AM

## 2018-09-04 ENCOUNTER — Telehealth: Payer: Self-pay | Admitting: *Deleted

## 2018-09-04 ENCOUNTER — Inpatient Hospital Stay: Payer: Medicare Other

## 2018-09-04 ENCOUNTER — Inpatient Hospital Stay: Payer: Medicare Other | Attending: Hematology & Oncology | Admitting: Hematology & Oncology

## 2018-09-04 DIAGNOSIS — R634 Abnormal weight loss: Secondary | ICD-10-CM | POA: Insufficient documentation

## 2018-09-04 DIAGNOSIS — C670 Malignant neoplasm of trigone of bladder: Secondary | ICD-10-CM | POA: Insufficient documentation

## 2018-09-04 DIAGNOSIS — D500 Iron deficiency anemia secondary to blood loss (chronic): Secondary | ICD-10-CM | POA: Insufficient documentation

## 2018-09-04 NOTE — Telephone Encounter (Signed)
Transition Care Management Follow-up Telephone Call   Date discharged? 09/03/18   How have you been since you were released from the hospital? " I still have cold in my chest. I will be doing better once it is broke up"   Do you understand why you were in the hospital? yes   Do you understand the discharge instructions? yes   Where were you discharged to? home   Items Reviewed:  Medications reviewed: yes  Allergies reviewed: yes  Dietary changes reviewed: yes  Referrals reviewed: yes   Functional Questionnaire:   Activities of Daily Living (ADLs):   She states they are independent in the following: ambulation, bathing and hygiene, feeding, continence, grooming, toileting and dressing States they require assistance with the following: na   Any transportation issues/concerns?: no   Any patient concerns? no   Confirmed importance and date/time of follow-up visits scheduled yes  Provider Appointment booked with PCP 09/05/18  @1140   Confirmed with patient if condition begins to worsen call PCP or go to the ER.  Patient was given the office number and encouraged to call back with question or concerns.  : yes

## 2018-09-05 ENCOUNTER — Encounter: Payer: Self-pay | Admitting: Family

## 2018-09-05 ENCOUNTER — Ambulatory Visit (INDEPENDENT_AMBULATORY_CARE_PROVIDER_SITE_OTHER): Payer: Medicare Other | Admitting: Family

## 2018-09-05 VITALS — BP 116/78 | HR 120 | Temp 98.9°F | Resp 16 | Wt 71.6 lb

## 2018-09-05 DIAGNOSIS — J441 Chronic obstructive pulmonary disease with (acute) exacerbation: Secondary | ICD-10-CM

## 2018-09-05 DIAGNOSIS — C679 Malignant neoplasm of bladder, unspecified: Secondary | ICD-10-CM

## 2018-09-05 DIAGNOSIS — E0590 Thyrotoxicosis, unspecified without thyrotoxic crisis or storm: Secondary | ICD-10-CM

## 2018-09-05 DIAGNOSIS — R634 Abnormal weight loss: Secondary | ICD-10-CM

## 2018-09-05 NOTE — Progress Notes (Signed)
Subjective:    Patient ID: Donna Curry, female    DOB: [birth year], 63 y.o.   MRN: [id number]  HPI  Patient is a 63 yr old female who presents today for hospital follow up. She is accompanied by 3 female family members.   Discharge summary is reviewed. She presented to the ED on 08/31/18 and was discharged on 09/03/18. During her hospitalization she was treated for acute COPD exacerbation. She was treated with steroids, nebulizers nd azithromycin.   Since returning home she notes some improvement but still has some DOE. Reports that her appetite has been poor despite use of marinol and  Megace. Reports that "food just tastes nasty."  Reports that she has DOE.    Lab Results  Component Value Date   WBC 3.5 (L) 09/01/2018   HGB 10.7 (L) 09/01/2018   HCT 34.1 (L) 09/01/2018   MCV 96.3 09/01/2018   PLT 185 09/01/2018   Wt Readings from Last 3 Encounters:  09/05/18 71 lb 9.6 oz (32.5 kg)  08/31/18 78 lb (35.4 kg)  08/13/18 79 lb (35.8 kg)      Review of Systems    see HPI  Past Medical History:  Diagnosis Date  . Anemia   . Family history of adverse reaction to anesthesia    brother wakes up and dont know who he is and sister has n/v  . Goals of care, counseling/discussion 02/09/2018  . History of kidney stones   . Hypertension   . Iron deficiency anemia due to chronic blood loss 03/01/2018  . Measles as a child  . Mumps as a child  . TIA (transient ischemic attack)   . Tobacco abuse      Social History   Socioeconomic History  . Marital status: Single    Spouse name: Not on file  . Number of children: Not on file  . Years of education: Not on file  . Highest education level: Not on file  Occupational History  . Not on file  Social Needs  . Financial resource strain: Not on file  . Food insecurity:    Worry: Not on file    Inability: Not on file  . Transportation needs:    Medical: Not on file    Non-medical: Not on file  Tobacco Use  . Smoking status:  Current Some Day Smoker    Packs/day: 0.50    Types: Cigarettes    Last attempt to quit: 06/15/2018    Years since quitting: 0.2  . Smokeless tobacco: Never Used  Substance and Sexual Activity  . Alcohol use: No    Alcohol/week: 0.0 standard drinks  . Drug use: Yes    Comment: marijuana denies at preop  . Sexual activity: Not Currently  Lifestyle  . Physical activity:    Days per week: Not on file    Minutes per session: Not on file  . Stress: Not on file  Relationships  . Social connections:    Talks on phone: Not on file    Gets together: Not on file    Attends religious service: Not on file    Active member of club or organization: Not on file    Attends meetings of clubs or organizations: Not on file    Relationship status: Not on file  . Intimate partner violence:    Fear of current or ex partner: Not on file    Emotionally abused: Not on file    Physically abused: Not on file  Forced sexual activity: Not on file  Other Topics Concern  . Not on file  Social History Narrative   Denies hx of drug use   Single   1 daughter age 46 lives with daughter and grandson who is 56.   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    Past Surgical History:  Procedure Laterality Date  . CYSTOSCOPY W/ URETERAL STENT PLACEMENT Right 12/13/2017   Procedure: CYSTOSCOPY WITH RIGHT RETROGRADE PYELOGRAM/URETERAL RIGHT STENT PLACEMENT;  Surgeon: [name], MD;  Location: [facility];  Service: Urology;  Laterality: Right;  . IR IMAGING GUIDED PORT INSERTION  02/23/2018  . TRANSURETHRAL RESECTION OF BLADDER TUMOR N/A 12/13/2017   Procedure: TRANSURETHRAL RESECTION OF BLADDER TUMOR (TURBT);  Surgeon: [name], MD;  Location: [facility];  Service: Urology;  Laterality: N/A;  . TRANSURETHRAL RESECTION OF BLADDER TUMOR WITH GYRUS (TURBT-GYRUS)     [name] 12-13-17  . tubes tied    . uterine ablation     about 2005    Family History  Problem Relation  Age of Onset  . Hypertension Mother   . Diabetes Father   . Hypertension Brother   . HIV Brother     No Known Allergies  Current Outpatient Medications on File Prior to Visit  Medication Sig Dispense Refill  . acetaminophen (TYLENOL) 500 MG tablet Take 1,000 mg by mouth every 8 (eight) hours as needed for moderate pain.    Marland Kitchen albuterol (PROVENTIL HFA;VENTOLIN HFA) 108 (90 Base) MCG/ACT inhaler Inhale 2 puffs into the lungs every 6 (six) hours as needed for wheezing or shortness of breath. 1 Inhaler 2  . albuterol (PROVENTIL) (2.5 MG/3ML) 0.083% nebulizer solution Take 3 mLs (2.5 mg total) by nebulization every 4 (four) hours as needed for wheezing or shortness of breath. 75 mL 0  . amLODipine (NORVASC) 10 MG tablet Take 5 mg by mouth daily.    Marland Kitchen aspirin EC 81 MG tablet Take 81 mg by mouth daily as needed (with blood pressure medication, as directed.).    Marland Kitchen azithromycin (ZITHROMAX) 250 MG tablet Take 1 tablet (250 mg total) by mouth daily. 3 each 0  . budesonide-formoterol (SYMBICORT) 160-4.5 MCG/ACT inhaler Inhale 2 puffs into the lungs 2 (two) times daily. 1 Inhaler 1  . docusate sodium (COLACE) 100 MG capsule Take 1 capsule (100 mg total) by mouth 2 (two) times daily. 10 capsule 0  . ergocalciferol (VITAMIN D2) 1.25 MG (50000 UT) capsule Take 1 capsule (50,000 Units total) by mouth once a week. 4 capsule 2  . Ipratropium-Albuterol (COMBIVENT) 20-100 MCG/ACT AERS respimat Inhale 1 puff into the lungs every 6 (six) hours. 1 Inhaler 1  . lidocaine-prilocaine (EMLA) cream Apply to affected area once (Patient taking differently: Apply 1 application topically as needed. To port, prior to access.) 30 g 3  . MARINOL 2.5 MG capsule TAKE 1 CAPSULE BY MOUTH TWICE DAILY BEFORE A MEAL (Patient taking differently: Take 2.5 mg by mouth every morning. ) 60 capsule 0  . megestrol (MEGACE ES) 625 MG/5ML suspension Take 5 mLs (625 mg total) by mouth daily. 150 mL 3  . nicotine (NICODERM CQ - DOSED IN MG/24  HOURS) 14 mg/24hr patch Place 1 patch (14 mg total) onto the skin daily. 28 patch 0  . ondansetron (ZOFRAN) 8 MG tablet Take 1 tablet (8 mg total) by mouth 2 (two) times daily as needed. Start on the third day after chemotherapy. (Patient taking  differently: Take 8 mg by mouth 2 (two) times daily as needed for nausea or vomiting. Start on the third day after chemotherapy.) 30 tablet 1  . oxyCODONE (OXY IR/ROXICODONE) 5 MG immediate release tablet Take 1 tablet (5 mg total) by mouth every 6 (six) hours as needed for severe pain. 60 tablet 0  . predniSONE (DELTASONE) 20 MG tablet Take 2 tablets (40 mg total) by mouth daily for 3 days. 6 tablet 0  . varenicline (CHANTIX PAK) 0.5 MG X 11 & 1 MG X 42 tablet Take one 0.5 mg tablet daily x 3 days, then increase to one 0.5 mg tab twice daily x 4 days, then increase to one 1 mg tablet twice daily. 53 tablet 0   No current facility-administered medications on file prior to visit.     BP 116/78 (BP Location: Left Arm, Patient Position: Sitting, Cuff Size: Small)   Pulse (!) 120   Temp 98.9 F (37.2 C) (Oral)   Resp 16   Wt 71 lb 9.6 oz (32.5 kg)   SpO2 95%   BMI 11.91 kg/m    Objective:   Physical Exam Constitutional:      Appearance: She is cachectic.  Eyes:     General: No scleral icterus. Cardiovascular:     Rate and Rhythm: Normal rate and regular rhythm.  Pulmonary:     Effort: Pulmonary effort is normal.     Breath sounds: Normal breath sounds.  Neurological:     Mental Status: She is oriented to person, place, and time.  Psychiatric:        Mood and Affect: Mood normal.        Behavior: Behavior normal.           Assessment & Plan:  Weight loss-  Weight is dangerously low. We discussed this today with the patient and her family.  Discussed that she needs to eat 3 well balanced meals/day followed by 3-4 cans of ensure a day as further weight loss for her will be life threatening.  She verbalizes understanding and is motivated  to try to start eating more.    Hyperthyroid- Review of the hospital labs shows that she is actually hyperthyroid which is likely contributing to her weight loss. Will refer to endocrinology for further evaluation.   Lab Results  Component Value Date   TSH 0.125 (L) 08/31/2018     COPD exacerbation-  She is breathing well at rest but does get quite winded with ambulation.  I walked the pt. Sats dropped to 91% and she fatigued quickly.  I think her fatigue and dyspnea is multifactorial (COPD/debilitation/anemia).   Continue current inhalers.  Monitor.    Bladder Cancer- this is being followed and treated by oncology.

## 2018-09-05 NOTE — Patient Instructions (Signed)
Please schedule follow up with Dr. Andi Hence should be contacted about scheduling your follow up with Dr. Lake Bells [phone]. Eat 3 well balanced meals a day and 3-4 cans of ensure.

## 2018-09-06 ENCOUNTER — Telehealth: Payer: Self-pay | Admitting: Family

## 2018-09-06 ENCOUNTER — Telehealth: Payer: Self-pay | Admitting: Internal Medicine

## 2018-09-06 NOTE — Telephone Encounter (Signed)
-----   Message from Elayne Snare, MD sent at 09/06/2018  1:50 PM EST ----- Please arrange as soon as possible with any doctor ----- Message ----- From: [name], NP Sent: 09/06/2018   1:05 PM EST To: Elayne Snare, MD  Dr. Dwyane Dee,  I placed a referral for this pt for endo. She is hyperthyroid, has bladder CA and only weighs 71 lbs.  Can we please get her in ASAP, I want to get her thyroid treated before she loses any more weight since her weight is dangerously low.  Thank you,  Lenna Sciara

## 2018-09-06 NOTE — Telephone Encounter (Signed)
Called home number.  Got her sister "BJ".  Advised her that I am placing referral to endocrinology for Ra[place]hel due to overactive thyroid. She will relay information to her sister.

## 2018-09-06 NOTE — Telephone Encounter (Signed)
I wanted to inform you this patient is scheduled to come see you on Tuesday. Dr. Dwyane Dee o.k. the referral and you where the first available.

## 2018-09-11 ENCOUNTER — Ambulatory Visit: Payer: Medicare Other | Admitting: Internal Medicine

## 2018-09-12 ENCOUNTER — Other Ambulatory Visit: Payer: Self-pay | Admitting: *Deleted

## 2018-09-12 ENCOUNTER — Other Ambulatory Visit: Payer: Self-pay | Admitting: Family

## 2018-09-12 DIAGNOSIS — C670 Malignant neoplasm of trigone of bladder: Secondary | ICD-10-CM

## 2018-09-12 MED ORDER — OXYCODONE HCL 5 MG PO TABS: 5.0000 mg | ORAL_TABLET | Freq: Four times a day (QID) | ORAL | 0 refills | Status: DC | PRN

## 2018-09-14 ENCOUNTER — Ambulatory Visit: Payer: Medicare Other | Admitting: Internal Medicine

## 2018-09-20 ENCOUNTER — Other Ambulatory Visit: Payer: Medicare Other

## 2018-09-20 ENCOUNTER — Inpatient Hospital Stay (HOSPITAL_BASED_OUTPATIENT_CLINIC_OR_DEPARTMENT_OTHER): Payer: Medicare Other | Admitting: Hematology & Oncology

## 2018-09-20 ENCOUNTER — Other Ambulatory Visit: Payer: Self-pay

## 2018-09-20 ENCOUNTER — Inpatient Hospital Stay: Payer: Medicare Other

## 2018-09-20 ENCOUNTER — Encounter: Payer: Self-pay | Admitting: Hematology & Oncology

## 2018-09-20 VITALS — BP 134/73 | HR 115 | Temp 98.3°F | Resp 20 | Wt 72.8 lb

## 2018-09-20 VITALS — BP 134/73 | HR 115 | Temp 98.3°F | Resp 20 | Wt 72.1 lb

## 2018-09-20 DIAGNOSIS — D500 Iron deficiency anemia secondary to blood loss (chronic): Secondary | ICD-10-CM

## 2018-09-20 DIAGNOSIS — C670 Malignant neoplasm of trigone of bladder: Secondary | ICD-10-CM | POA: Diagnosis not present

## 2018-09-20 DIAGNOSIS — R634 Abnormal weight loss: Secondary | ICD-10-CM

## 2018-09-20 DIAGNOSIS — Z95828 Presence of other vascular implants and grafts: Secondary | ICD-10-CM

## 2018-09-20 LAB — CMP (CANCER CENTER ONLY)
ALT: 9 U/L (ref 0–44)
AST: 12 U/L — ABNORMAL LOW (ref 15–41)
Albumin: 4.1 g/dL (ref 3.5–5.0)
Alkaline Phosphatase: 76 U/L (ref 38–126)
Anion gap: 8 (ref 5–15)
BUN: 11 mg/dL (ref 8–23)
CO2: 29 mmol/L (ref 22–32)
Calcium: 9.5 mg/dL (ref 8.9–10.3)
Chloride: 102 mmol/L (ref 98–111)
Creatinine: 0.8 mg/dL (ref 0.44–1.00)
GFR, Est AFR Am: >60 mL/min (ref >60)
GFR, Estimated: >60 mL/min (ref >60)
Glucose, Bld: 105 mg/dL — ABNORMAL HIGH (ref 70–99)
Potassium: 3.7 mmol/L (ref 3.5–5.1)
Sodium: 139 mmol/L (ref 135–145)
Total Bilirubin: 0.3 mg/dL (ref 0.3–1.2)
Total Protein: 7.4 g/dL (ref 6.5–8.1)

## 2018-09-20 LAB — CBC WITH DIFFERENTIAL (CANCER CENTER ONLY)
Abs Immature Granulocytes: 0.01 10*3/uL (ref 0.00–0.07)
Basophils Absolute: 0 10*3/uL (ref 0.0–0.1)
Basophils Relative: 0 %
Eosinophils Absolute: 0 10*3/uL (ref 0.0–0.5)
Eosinophils Relative: 0 %
HCT: 35.3 % — ABNORMAL LOW (ref 36.0–46.0)
Hemoglobin: 11.5 g/dL — ABNORMAL LOW (ref 12.0–15.0)
Immature Granulocytes: 0 %
Lymphocytes Relative: 9 %
Lymphs Abs: 0.5 10*3/uL — ABNORMAL LOW (ref 0.7–4.0)
MCH: 31.3 pg (ref 26.0–34.0)
MCHC: 32.6 g/dL (ref 30.0–36.0)
MCV: 95.9 fL (ref 80.0–100.0)
Monocytes Absolute: 0.7 10*3/uL (ref 0.1–1.0)
Monocytes Relative: 12 %
Neutro Abs: 4.3 10*3/uL (ref 1.7–7.7)
Neutrophils Relative %: 79 %
Platelet Count: 215 10*3/uL (ref 150–400)
RBC: 3.68 MIL/uL — ABNORMAL LOW (ref 3.87–5.11)
RDW: 15.1 % (ref 11.5–15.5)
WBC Count: 5.6 10*3/uL (ref 4.0–10.5)
nRBC: 0 % (ref 0.0–0.2)

## 2018-09-20 LAB — LACTATE DEHYDROGENASE: LDH: 197 U/L — ABNORMAL HIGH (ref 98–192)

## 2018-09-20 MED ORDER — SODIUM CHLORIDE 0.9% FLUSH
10.0000 mL | INTRAVENOUS | Status: DC | PRN
  Administered 2018-09-20: 10 mL via INTRAVENOUS
  Filled 2018-09-20: qty 10

## 2018-09-20 MED ORDER — HEPARIN SOD (PORK) LOCK FLUSH 100 UNIT/ML IV SOLN
500.0000 [IU] | Freq: Once | INTRAVENOUS | Status: AC
  Administered 2018-09-20: 500 [IU] via INTRAVENOUS
  Filled 2018-09-20: qty 5

## 2018-09-20 NOTE — Patient Instructions (Signed)

## 2018-09-20 NOTE — Progress Notes (Signed)
Hematology and Oncology Follow Up Visit  ALESHKA CORNEY [id number] 1955/10/30 63 y.o. 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 Shropshire is back for her follow-up.  Unfortunately, she is yet to have a repeat colonoscopy for a biopsy.  I am trying to get this set up so that we can get some specimen of tumor and send it off for molecular testing.  I know that this is quite difficult.  I know she has a hard time making appointments.  Hopefully, she will be able to make the appointment for a repeat cystoscopy.  Her weight is still quite low.  This is a problem.  She is seeing an endocrinologist about this.  Her family doctor thinks that she might have hyperthyroidism.  We will to see if this is the case.  There is been no bleeding.  There might be some occasional hematuria.  She has had no fever.  She has had some shortness of breath.  She is on nebulizers and inhalers at home.  She is not smoking.  By her albumin, she must be eating okay.  Her albumin is 4.1.  She has had no issues with pain.  I think a real problem is that if there is bladder tumor that is still residual, then she is not a candidate for a cystectomy.  Her performance status probably would not lend itself to major surgery like that.    Overall, her performance status is ECOG 2.    Medications:  Current Outpatient Medications:  .  acetaminophen (TYLENOL) 500 MG tablet, Take 1,000 mg by mouth every 8 (eight) hours as needed for moderate pain., Disp: , Rfl:  .  albuterol (PROVENTIL HFA;VENTOLIN HFA) 108 (90 Base) MCG/ACT inhaler, Inhale 2 puffs into the lungs every 6 (six) hours as needed for wheezing or shortness of breath., Disp: 1 Inhaler, Rfl: 2 .  albuterol  (PROVENTIL) (2.5 MG/3ML) 0.083% nebulizer solution, Take 3 mLs (2.5 mg total) by nebulization every 4 (four) hours as needed for wheezing or shortness of breath., Disp: 75 mL, Rfl: 0 .  amLODipine (NORVASC) 10 MG tablet, Take 5 mg by mouth daily., Disp: , Rfl:  .  aspirin EC 81 MG tablet, Take 81 mg by mouth daily as needed (with blood pressure medication, as directed.)., Disp: , Rfl:  .  azithromycin (ZITHROMAX) 250 MG tablet, Take 1 tablet (250 mg total) by mouth daily., Disp: 3 each, Rfl: 0 .  budesonide-formoterol (SYMBICORT) 160-4.5 MCG/ACT inhaler, Inhale 2 puffs into the lungs 2 (two) times daily., Disp: 1 Inhaler, Rfl: 1 .  docusate sodium (COLACE) 100 MG capsule, Take 1 capsule (100 mg total) by mouth 2 (two) times daily., Disp: 10 capsule, Rfl: 0 .  ergocalciferol (VITAMIN D2) 1.25 MG (50000 UT) capsule, Take 1 capsule (50,000 Units total) by mouth once a week., Disp: 4 capsule, Rfl: 2 .  Ipratropium-Albuterol (COMBIVENT) 20-100 MCG/ACT AERS respimat, Inhale 1 puff into the lungs every 6 (six) hours., Disp: 1 Inhaler, Rfl: 1 .  lidocaine-prilocaine (EMLA) cream, Apply to affected area once (Patient taking differently: Apply 1 application topically as needed. To port, prior to access.), Disp: 30 g, Rfl: 3 .  MARINOL 2.5 MG capsule, TAKE 1 CAPSULE BY MOUTH TWICE DAILY BEFORE  A MEAL (Patient taking differently: Take 2.5 mg by mouth every morning. ), Disp: 60 capsule, Rfl: 0 .  megestrol (MEGACE ES) 625 MG/5ML suspension, Take 5 mLs (625 mg total) by mouth daily., Disp: 150 mL, Rfl: 3 .  nicotine (NICODERM CQ - DOSED IN MG/24 HOURS) 14 mg/24hr patch, Place 1 patch (14 mg total) onto the skin daily., Disp: 28 patch, Rfl: 0 .  ondansetron (ZOFRAN) 8 MG tablet, Take 1 tablet (8 mg total) by mouth 2 (two) times daily as needed. Start on the third day after chemotherapy. (Patient taking differently: Take 8 mg by mouth 2 (two) times daily as needed for nausea or vomiting. Start on the third day after  chemotherapy.), Disp: 30 tablet, Rfl: 1 .  oxyCODONE (OXY IR/ROXICODONE) 5 MG immediate release tablet, Take 1 tablet (5 mg total) by mouth every 6 (six) hours as needed for severe pain., Disp: 60 tablet, Rfl: 0 .  varenicline (CHANTIX STARTING MONTH PAK) 0.5 MG X 11 & 1 MG X 42 tablet, TAKE AS DIRECTED ON  PACKAGE  INSTRUCTIONS, Disp: 53 tablet, Rfl: 0  Allergies: No Known Allergies  Past Medical History, Surgical history, Social history, and Family History were reviewed and updated.  Review of Systems: Review of Systems  Constitutional: Negative.   HENT:  Negative.   Eyes: Negative.   Respiratory: Negative.   Cardiovascular: Negative.   Gastrointestinal: Negative.   Endocrine: Negative.   Genitourinary: Negative.    Musculoskeletal: Negative.   Skin: Negative.   Neurological: Negative.   Hematological: Negative.   Psychiatric/Behavioral: Negative.     Physical Exam:  weight is 72 lb 12 oz (33 kg). Her temperature is 98.3 F (36.8 C). Her blood pressure is 134/73 and her pulse is 115 (abnormal). Her respiration is 20 and oxygen saturation is 97%.   Wt Readings from Last 3 Encounters:  09/20/18 72 lb 12 oz (33 kg)  09/20/18 72 lb 1.9 oz (32.7 kg)  09/05/18 71 lb 9.6 oz (32.5 kg)    Physical Exam Vitals signs reviewed.  HENT:     Head: Normocephalic and atraumatic.  Eyes:     Pupils: Pupils are equal, round, and reactive to light.  Neck:     Musculoskeletal: Normal range of motion.  Cardiovascular:     Rate and Rhythm: Normal rate and regular rhythm.     Heart sounds: Normal heart sounds.  Pulmonary:     Effort: Pulmonary effort is normal.     Breath sounds: Normal breath sounds.  Abdominal:     General: Bowel sounds are normal.     Palpations: Abdomen is soft.     Comments: Her abdominal exam is soft.  She has decent bowel sounds.  There might be some fullness in the hypoumbilical area.  She has no fluid wave.  There is no palpable liver or spleen tip.    Musculoskeletal: Normal range of motion.        General: No tenderness or deformity.  Lymphadenopathy:     Cervical: No cervical adenopathy.  Skin:    General: Skin is warm and dry.     Findings: No erythema or rash.  Neurological:     Mental Status: She is alert and oriented to person, place, and time.  Psychiatric:        Behavior: Behavior normal.        Thought Content: Thought content normal.        Judgment: Judgment normal.      Lab Results  Component Value Date   WBC 5.6 09/20/2018   HGB 11.5 (L) 09/20/2018   HCT 35.3 (L) 09/20/2018   MCV 95.9 09/20/2018   PLT 215 09/20/2018     Chemistry      Component Value Date/Time   NA 139 09/20/2018 1215   K 3.7 09/20/2018 1215   CL 102 09/20/2018 1215   CO2 29 09/20/2018 1215   BUN 11 09/20/2018 1215   CREATININE 0.80 09/20/2018 1215   CREATININE 1.10 08/20/2013 1027      Component Value Date/Time   CALCIUM 9.5 09/20/2018 1215   ALKPHOS 76 09/20/2018 1215   AST 12 (L) 09/20/2018 1215   ALT 9 09/20/2018 1215   BILITOT 0.3 09/20/2018 1215         Impression and Plan: Ms. Ingber is a 63 year old African-American female.  She has locally advanced high-grade urothelial carcinoma of the bladder.  I really believe that the cystoscopy is going to be incredibly important for [place].  I really hope that we will find that she does not have any active disease in the bladder.  I will also want to get a PET scan on her.  I think this is important.  I will see back in this in March.  I will see her back in about a month.  Hopefully, she will have seen the urologist and have had a cystoscopy.     [name], MD. 2/27/20201:32 PM

## 2018-09-21 ENCOUNTER — Encounter: Payer: Self-pay | Admitting: Internal Medicine

## 2018-09-21 ENCOUNTER — Other Ambulatory Visit: Payer: Self-pay | Admitting: Urology

## 2018-09-21 ENCOUNTER — Ambulatory Visit (INDEPENDENT_AMBULATORY_CARE_PROVIDER_SITE_OTHER): Payer: Medicare Other | Admitting: Internal Medicine

## 2018-09-21 VITALS — BP 122/78 | HR 102 | Ht 65.0 in | Wt 73.8 lb

## 2018-09-21 DIAGNOSIS — E0590 Thyrotoxicosis, unspecified without thyrotoxic crisis or storm: Secondary | ICD-10-CM | POA: Diagnosis not present

## 2018-09-21 LAB — FERRITIN: Ferritin: 155 ng/mL (ref 11–307)

## 2018-09-21 LAB — IRON AND TIBC
Iron: 31 ug/dL — ABNORMAL LOW (ref 41–142)
Saturation Ratios: 12 % — ABNORMAL LOW (ref 21–57)
TIBC: 253 ug/dL (ref 236–444)
UIBC: 221 ug/dL (ref 120–384)

## 2018-09-21 LAB — TSH: TSH: 0.36 u[IU]/mL (ref 0.35–4.50)

## 2018-09-21 LAB — T4, FREE: Free T4: 0.99 ng/dL (ref 0.60–1.60)

## 2018-09-21 NOTE — Progress Notes (Signed)
Name: CHARLISE GIOVANETTI  MRN/ DOB: [id number], 03-10-1956    Age/ Sex: 63 y.o., female    PCP: [name], NP   Reason for Endocrinology Evaluation: Low TSH      Date of Initial Endocrinology Evaluation: 09/23/2018     HPI: Ms. KHOLE ARTERBURN is a 63 y.o. female with a past medical history of COPD and bladder cancer. The patient presented for initial endocrinology clinic visit on 09/23/2018 for consultative assistance with her low TSH .   Pt is accompanied by her sister and sister-in-law. She finished her radiation and chemo treatment for bladder ca ~ 6 month ago as well as surgery.  She does have an upcoming surgery again in the next few weeks.  She was recently hospitalized for pneumonia, during this hospitalization she was noted to have a low TSH of 0.125 uIU/mL, with normal T4 and T3.  She is having difficulty with weight gain for a while, denies palpitations.She has hot flashes.  Denies diarrhea, anxiety, or jittery sensations  Has mild tremors of the hands which seems to be chronic.  In review of her chart patient also has been noted to have multiple CT scans with contrast.  HISTORY:  Past Medical History:  Past Medical History:  Diagnosis Date  . Anemia   . Family history of adverse reaction to anesthesia    brother wakes up and dont know who he is and sister has n/v  . Goals of care, counseling/discussion 02/09/2018  . History of kidney stones   . Hypertension   . Iron deficiency anemia due to chronic blood loss 03/01/2018  . Measles as a child  . Mumps as a child  . TIA (transient ischemic attack)   . Tobacco abuse    Past Surgical History:  Past Surgical History:  Procedure Laterality Date  . CYSTOSCOPY W/ URETERAL STENT PLACEMENT Right 12/13/2017   Procedure: CYSTOSCOPY WITH RIGHT RETROGRADE PYELOGRAM/URETERAL RIGHT STENT PLACEMENT;  Surgeon: [name], MD;  Location: [facility];  Service: Urology;  Laterality: Right;  . IR IMAGING GUIDED PORT  INSERTION  02/23/2018  . TRANSURETHRAL RESECTION OF BLADDER TUMOR N/A 12/13/2017   Procedure: TRANSURETHRAL RESECTION OF BLADDER TUMOR (TURBT);  Surgeon: [name], MD;  Location: [facility];  Service: Urology;  Laterality: N/A;  . TRANSURETHRAL RESECTION OF BLADDER TUMOR WITH GYRUS (TURBT-GYRUS)     [name] 12-13-17  . tubes tied    . uterine ablation     about 2005      Social History:  reports that she has been smoking cigarettes. She has been smoking about 0.50 packs per day. She has never used smokeless tobacco. She reports current drug use. She reports that she does not drink alcohol.  Family History: family history includes Diabetes in her father; HIV in her brother; Hypertension in her brother and mother.   HOME MEDICATIONS: Allergies as of 09/21/2018   No Known Allergies     Medication List       Accurate as of September 21, 2018 11:59 PM. Always use your most recent med list.        acetaminophen 500 MG tablet Commonly known as:  TYLENOL Take 1,000 mg by mouth every 8 (eight) hours as needed for moderate pain.   albuterol 108 (90 Base) MCG/ACT inhaler Commonly known as:  PROVENTIL HFA;VENTOLIN HFA Inhale 2 puffs into the lungs every 6 (six) hours as needed for wheezing or shortness of breath.   albuterol (2.5  MG/3ML) 0.083% nebulizer solution Commonly known as:  PROVENTIL Take 3 mLs (2.5 mg total) by nebulization every 4 (four) hours as needed for wheezing or shortness of breath.   amLODipine 10 MG tablet Commonly known as:  NORVASC Take 5 mg by mouth daily.   aspirin EC 81 MG tablet Take 81 mg by mouth daily as needed (with blood pressure medication, as directed.).   azithromycin 250 MG tablet Commonly known as:  ZITHROMAX Take 1 tablet (250 mg total) by mouth daily.   budesonide-formoterol 160-4.5 MCG/ACT inhaler Commonly known as:  SYMBICORT Inhale 2 puffs into the lungs 2 (two) times daily.   docusate sodium 100 MG capsule Commonly known as:   COLACE Take 1 capsule (100 mg total) by mouth 2 (two) times daily.   ergocalciferol 1.25 MG (50000 UT) capsule Commonly known as:  VITAMIN D2 Take 1 capsule (50,000 Units total) by mouth once a week.   Ipratropium-Albuterol 20-100 MCG/ACT Aers respimat Commonly known as:  COMBIVENT Inhale 1 puff into the lungs every 6 (six) hours.   lidocaine-prilocaine cream Commonly known as:  EMLA Apply to affected area once   MARINOL 2.5 MG capsule Generic drug:  dronabinol TAKE 1 CAPSULE BY MOUTH TWICE DAILY BEFORE A MEAL   megestrol 625 MG/5ML suspension Commonly known as:  MEGACE ES Take 5 mLs (625 mg total) by mouth daily.   nicotine 14 mg/24hr patch Commonly known as:  NICODERM CQ - dosed in mg/24 hours Place 1 patch (14 mg total) onto the skin daily.   ondansetron 8 MG tablet Commonly known as:  ZOFRAN Take 1 tablet (8 mg total) by mouth 2 (two) times daily as needed. Start on the third day after chemotherapy.   oxyCODONE 5 MG immediate release tablet Commonly known as:  Oxy IR/ROXICODONE Take 1 tablet (5 mg total) by mouth every 6 (six) hours as needed for severe pain.   varenicline 0.5 MG X 11 & 1 MG X 42 tablet Commonly known as:  CHANTIX STARTING MONTH PAK TAKE AS DIRECTED ON  PACKAGE  INSTRUCTIONS         REVIEW OF SYSTEMS: A comprehensive ROS was conducted with the patient and is negative except as per HPI and below:  Review of Systems  Constitutional: Negative for fever.  HENT: Negative for congestion and sore throat.   Respiratory: Negative for cough and shortness of breath.   Cardiovascular: Negative for chest pain and palpitations.  Gastrointestinal: Negative for diarrhea and nausea.  Genitourinary: Positive for dysuria. Negative for frequency.  Neurological: Positive for tremors. Negative for tingling.  Psychiatric/Behavioral: Negative for depression. The patient is not nervous/anxious.        OBJECTIVE:  VS: BP 122/78 (BP Location: Right Arm, Patient  Position: Sitting, Cuff Size: Normal)   Pulse (!) 102   Ht 5\' 5"  (1.651 m)   Wt 73 lb 12.8 oz (33.5 kg)   SpO2 92%   BMI 12.28 kg/m    Wt Readings from Last 3 Encounters:  09/21/18 73 lb 12.8 oz (33.5 kg)  09/20/18 72 lb 12 oz (33 kg)  09/20/18 72 lb 1.9 oz (32.7 kg)     EXAM: General: Pt appears well and is in NAD, in a wheelchair.  Eyes: External eye exam normal without stare, lid lag or exophthalmos.  EOM intact.  PERRL.  Ears, Nose, Throat: Hearing: Grossly intact bilaterally Throat: Clear without mass, erythema or exudate  Neck: General: Supple without adenopathy. Thyroid: Thyroid size normal.  No goiter or nodules  appreciated. No thyroid bruit.  Lungs: Clear with good BS bilat with no rales, rhonchi, or wheezes  Heart: Auscultation: RRR.  Abdomen: Normoactive bowel sounds, soft, nontender, without masses or organomegaly palpable  Extremities:  BL LE: No pretibial edema normal ROM and strength.  Skin: Hair: Texture and amount normal with gender appropriate distribution Skin Inspection: No rashes. Skin Palpation: Skin temperature, texture, and thickness normal to palpation  Neuro: Cranial nerves: II - XII grossly intact  Motor: Normal strength throughout DTRs: 2+ and symmetric in UE without delay in relaxation phase  Mental Status: Judgment, insight: Intact Orientation: Oriented to time, place, and person Mood and affect: No depression, anxiety, or agitation     DATA REVIEWED:   Results for KELLEE, SITTNER (MRN [id number]) as of 09/23/2018 12:01  Ref. Range 08/31/2018 22:11 09/01/2018 08:08 09/21/2018 16:09  TSH Latest Ref Range: 0.35 - 4.50 uIU/mL 0.125 (L)  0.36  Triiodothyronine,Free,Serum Latest Ref Range: 2.0 - 4.4 pg/mL  2.0   Triiodothyronine (T3) Latest Ref Range: 76 - 181 ng/dL   107  T4,Free(Direct) Latest Ref Range: 0.60 - 1.60 ng/dL  0.94 0.99    ASSESSMENT/PLAN/RECOMMENDATIONS:   1. Subclinical Hyperthyroidism:  -Patient with nonspecific symptoms that  are NOT attributed to her thyroid. -She does not have any local neck symptoms, and review of her recent imaging there is no evidence of thyroid nodules. -We discussed the differential diagnosis of Graves' disease, autonomous thyroid nodule(s) given recent contrast administration, versus euthyroid sick syndrome since her TFTs where abnormal during hospitalization for pneumonia.  -Repeat TFTs today came back normal, this is mostly consistent with euthyroid sick syndrome. -No further endocrinology work-up is needed at this point.   Follow-up PRN.  Signed electronically by: Mack Guise, MD  [facility]  [address] [place], [place] [address] Phone: [phone] FAX: [phone]   CC: [name], [address] [place] [address] Phone: [phone] Fax: [phone]   Return to Endocrinology clinic as below: Future Appointments  Date Time Provider [place]  09/27/2018 11:30 AM [facility] PAT 2 [facility] None  09/27/2018  2:45 PM Juanito Doom, MD [facility] None  09/28/2018 11:20 AM [name], NP [facility] PEC  10/19/2018 10:00 AM [facility] PET CT 1 [facility] [place]  10/26/2018 11:30 AM [facility] INJ NURSE [facility] None  10/26/2018 12:00 PM [facility] LAB [facility] None  10/26/2018 12:30 PM [name], [name], MD [facility] None  11/01/2018 10:10 AM Shamleffer, Melanie Crazier, MD [facility] None

## 2018-09-24 ENCOUNTER — Telehealth: Payer: Self-pay | Admitting: Hematology & Oncology

## 2018-09-24 ENCOUNTER — Encounter: Payer: Self-pay | Admitting: Internal Medicine

## 2018-09-24 NOTE — Telephone Encounter (Signed)
Spoke with patient regarding adding appointment she is ok with date/time per 2/27 result note

## 2018-09-25 LAB — TRAB (TSH RECEPTOR BINDING ANTIBODY): TRAB: <1 IU/L (ref <=2.00)

## 2018-09-25 LAB — T3: T3, Total: 107 ng/dL (ref 76–181)

## 2018-09-26 NOTE — Progress Notes (Signed)
09/20/2018-noted in [place]- office note by Dr. Jonette Eva and labs- CBC w/diff., CMP  09/03/2018- noted in Epic- CT abd.pelvis w/ contrast  09/02/2018- noted in Epic-ECHO  08/31/2018- noted in Epic- EKG, CXR, CT angio chest PE w/or /wo contrast

## 2018-09-26 NOTE — Patient Instructions (Signed)
Donna Curry  09/26/2018   Your procedure is scheduled on: Wednesday 10/03/2018  Report to [facility] Main  Entrance              Report to admitting at  [place]  AM    Call this number if you have problems the morning of surgery [phone]    Remember: Do not eat food or drink liquids :After Midnight.               BRUSH YOUR TEETH MORNING OF SURGERY AND RINSE YOUR MOUTH OUT, NO CHEWING GUM CANDY OR MINTS.     Take these medicines the morning of surgery with A SIP OF WATER: Amlodipine (Norvasc), use Albuterol inhaler if needed, use Albuterol nebulizer if needed, use Symbicort inhaler , use Combivent inhaler and bring inhalers with you to the hospital.                                  You may not have any metal on your body including hair pins and              piercings  Do not wear jewelry, make-up, lotions, powders or perfumes, deodorant             Do not wear nail polish.  Do not shave  48 hours prior to surgery.               Do not bring valuables to the hospital. [place].  Contacts, dentures or bridgework may not be worn into surgery.  Leave suitcase in the car. After surgery it may be brought to your room.     Patients discharged the day of surgery will not be allowed to drive home. IF YOU ARE HAVING SURGERY AND GOING HOME THE SAME DAY, YOU MUST HAVE AN ADULT TO DRIVE YOU HOME AND BE WITH YOU FOR 24 HOURS. YOU MAY GO HOME BY TAXI OR UBER OR ORTHERWISE, BUT AN ADULT MUST ACCOMPANY YOU HOME AND STAY WITH YOU FOR 24 HOURS.  Name and phone number of your driver:                Please read over the following fact sheets you were given: _____________________________________________________________________             [facility] - Preparing for Surgery Before surgery, you can play an important role.  Because skin is not sterile, your skin needs to be as free of germs as possible.  You can reduce  the number of germs on your skin by washing with CHG (chlorahexidine gluconate) soap before surgery.  CHG is an antiseptic cleaner which kills germs and bonds with the skin to continue killing germs even after washing. Please DO NOT use if you have an allergy to CHG or antibacterial soaps.  If your skin becomes reddened/irritated stop using the CHG and inform your nurse when you arrive at Short Stay. Do not shave (including legs and underarms) for at least 48 hours prior to the first CHG shower.  You may shave your face/neck. Please follow these instructions carefully:  1.  Shower with CHG Soap the night before surgery and the  morning of Surgery.  2.  If you choose to  wash your hair, wash your hair first as usual with your  normal  shampoo.  3.  After you shampoo, rinse your hair and body thoroughly to remove the  shampoo.                           4.  Use CHG as you would any other liquid soap.  You can apply chg directly  to the skin and wash                       Gently with a scrungie or clean washcloth.  5.  Apply the CHG Soap to your body ONLY FROM THE NECK DOWN.   Do not use on face/ open                           Wound or open sores. Avoid contact with eyes, ears mouth and genitals (private parts).                       Wash face,  Genitals (private parts) with your normal soap.             6.  Wash thoroughly, paying special attention to the area where your surgery  will be performed.  7.  Thoroughly rinse your body with warm water from the neck down.  8.  DO NOT shower/wash with your normal soap after using and rinsing off  the CHG Soap.                9.  Pat yourself dry with a clean towel.            10.  Wear clean pajamas.            11.  Place clean sheets on your bed the night of your first shower and do not  sleep with pets. Day of Surgery : Do not apply any lotions/deodorants the morning of surgery.  Please wear clean clothes to the hospital/surgery center.  FAILURE TO FOLLOW  THESE INSTRUCTIONS MAY RESULT IN THE CANCELLATION OF YOUR SURGERY PATIENT SIGNATURE_________________________________  NURSE SIGNATURE__________________________________  ________________________________________________________________________

## 2018-09-27 ENCOUNTER — Encounter: Payer: Self-pay | Admitting: Pulmonary Disease

## 2018-09-27 ENCOUNTER — Ambulatory Visit (INDEPENDENT_AMBULATORY_CARE_PROVIDER_SITE_OTHER): Payer: Medicare Other | Admitting: Pulmonary Disease

## 2018-09-27 ENCOUNTER — Encounter (HOSPITAL_COMMUNITY)
Admission: RE | Admit: 2018-09-27 | Discharge: 2018-09-27 | Disposition: A | Discharge status: Home / Self Care | Payer: Medicare Other | Source: Ambulatory Visit | Attending: Family | Admitting: Family

## 2018-09-27 VITALS — BP 104/62 | HR 111 | Ht 65.0 in

## 2018-09-27 DIAGNOSIS — J432 Centrilobular emphysema: Secondary | ICD-10-CM | POA: Diagnosis not present

## 2018-09-27 DIAGNOSIS — F17210 Nicotine dependence, cigarettes, uncomplicated: Secondary | ICD-10-CM

## 2018-09-27 DIAGNOSIS — R0600 Dyspnea, unspecified: Secondary | ICD-10-CM | POA: Diagnosis not present

## 2018-09-27 NOTE — Progress Notes (Signed)
Synopsis: Centrilobular emphysema, first seen in October 2019.  Also has bladder cancer receiving treatment by [name] as of October 2019.  Has smoked 1/2 pack of cigarettes daily and is age 63, currently smoking 3 cigarettes a day as of October 2019.  Subjective:    Patient ID: Donna Curry, female    DOB: November 12, 1955, 63 y.o.   MRN: [id number]  HPI Chief Complaint  Patient presents with  . Follow-up    Increased dyspnea on exertion for the past 28yr.    [name] comes to clinic today after she was hospitalized about a month ago for a COPD exacerbation.  She says that her breathing in general has been worse lately.  She takes Symbicort at home but she says only as needed.  It sounds as if she uses albuterol more frequently than that. She continues to smoke cigarettes.  She has Chantix at home and she has tried to quit.   Past Medical History:  Diagnosis Date  . Anemia   . Family history of adverse reaction to anesthesia    brother wakes up and dont know who he is and sister has n/v  . Goals of care, counseling/discussion 02/09/2018  . History of kidney stones   . Hypertension   . Iron deficiency anemia due to chronic blood loss 03/01/2018  . Measles as a child  . Mumps as a child  . TIA (transient ischemic attack)   . Tobacco abuse         Review of Systems  Constitutional: Negative for fever and unexpected weight change.  HENT: Negative for congestion, dental problem, ear pain, nosebleeds, postnasal drip, rhinorrhea, sinus pressure, sneezing, sore throat and trouble swallowing.   Eyes: Negative for redness and itching.  Respiratory: Positive for shortness of breath. Negative for cough, chest tightness and wheezing.   Cardiovascular: Negative for palpitations and leg swelling.  Gastrointestinal: Negative for nausea and vomiting.  Genitourinary: Negative for dysuria.  Musculoskeletal: Negative for joint swelling.  Skin: Negative for rash.  Allergic/Immunologic:  Negative.  Negative for environmental allergies, food allergies and immunocompromised state.  Neurological: Negative for headaches.  Hematological: Does not bruise/bleed easily.  Psychiatric/Behavioral: Negative for dysphoric mood. The patient is not nervous/anxious.        Objective:   Physical Exam Vitals:   09/27/18 1531  BP: 104/62  Pulse: (!) 111  SpO2: 91%  Height: 5\' 5"  (1.651 m)    RA  Gen: chronically ill appearing HENT: OP clear, TM's clear, neck supple PULM: poor air movement, B, normal percussion CV: RRR, no mgr, trace edema GI: BS+, soft, nontender Derm: no cyanosis or rash Psyche: normal mood and affect  Lung imaging: February 2020 CT angiogram chest images independently reviewed showing advanced centrilobular emphysema no pulmonary embolism  CBC    Component Value Date/Time   WBC 5.6 09/20/2018 1215   WBC 3.5 (L) 09/01/2018 0402   RBC 3.68 (L) 09/20/2018 1215   HGB 11.5 (L) 09/20/2018 1215   HCT 35.3 (L) 09/20/2018 1215   PLT 215 09/20/2018 1215   MCV 95.9 09/20/2018 1215   MCH 31.3 09/20/2018 1215   MCHC 32.6 09/20/2018 1215   RDW 15.1 09/20/2018 1215   LYMPHSABS 0.5 (L) 09/20/2018 1215   MONOABS 0.7 09/20/2018 1215   EOSABS 0.0 09/20/2018 1215   BASOSABS 0.0 09/20/2018 1215   Hospitalization records from February 2020 reviewed where the patient was seen for a COPD exacerbation, treated with IV steroids bronchodilators and discharged home.  She had an ambulatory oximetry test while hospitalized which did not qualify her for oxygen     Assessment & Plan:   Centrilobular emphysema (HCC)  Dyspnea, unspecified type  Cigarette smoker  Discussion: She has advanced emphysema based on my personal review of the CT scan of her chest, she has worsening symptoms and unfortunately still smokes cigarettes.  She also did not have much understanding of how to use her medicines appropriately.  She would benefit from a long-acting beta agonist, long-acting  muscarinic antagonist, and inhaled corticosteroid.  I think the best bet would be to prescribe Trelegy if her insurance will cover it as this would be the simplest medicine for her to take.  Plan: Advanced emphysema: Continue Symbicort 2 puffs twice a day until we can get you a prescription for Trelegy Once you have the prescription for Trelegy take 1 puff daily no matter how you feel Use albuterol as needed for chest tightness wheezing or shortness of breath Practice good hand hygiene Stay active  Cigarette smoking Quit Use chantix to quit  Shortness of breath: We will check your oxygen level while walking  Please come back to see [place] in 1 to 2 weeks with a nurse practitioner  Greater than 50% of this 28-minute visit was spent face-to-face     Current Outpatient Medications:  .  acetaminophen (TYLENOL) 500 MG tablet, Take 1,000 mg by mouth every 8 (eight) hours as needed for moderate pain., Disp: , Rfl:  .  albuterol (PROVENTIL HFA;VENTOLIN HFA) 108 (90 Base) MCG/ACT inhaler, Inhale 2 puffs into the lungs every 6 (six) hours as needed for wheezing or shortness of breath., Disp: 1 Inhaler, Rfl: 2 .  albuterol (PROVENTIL) (2.5 MG/3ML) 0.083% nebulizer solution, Take 3 mLs (2.5 mg total) by nebulization every 4 (four) hours as needed for wheezing or shortness of breath., Disp: 75 mL, Rfl: 0 .  amLODipine (NORVASC) 10 MG tablet, Take 10 mg by mouth daily. , Disp: , Rfl:  .  aspirin EC 81 MG tablet, Take 81 mg by mouth daily. , Disp: , Rfl:  .  budesonide-formoterol (SYMBICORT) 160-4.5 MCG/ACT inhaler, Inhale 2 puffs into the lungs 2 (two) times daily., Disp: 1 Inhaler, Rfl: 1 .  ergocalciferol (VITAMIN D2) 1.25 MG (50000 UT) capsule, Take 1 capsule (50,000 Units total) by mouth once a week., Disp: 4 capsule, Rfl: 2 .  Ipratropium-Albuterol (COMBIVENT) 20-100 MCG/ACT AERS respimat, Inhale 1 puff into the lungs every 6 (six) hours., Disp: 1 Inhaler, Rfl: 1 .  lidocaine-prilocaine (EMLA)  cream, Apply to affected area once (Patient taking differently: Apply 1 application topically as needed. To port, prior to access.), Disp: 30 g, Rfl: 3 .  MARINOL 2.5 MG capsule, TAKE 1 CAPSULE BY MOUTH TWICE DAILY BEFORE A MEAL (Patient taking differently: Take 2.5 mg by mouth every morning. ), Disp: 60 capsule, Rfl: 0 .  nicotine (NICODERM CQ - DOSED IN MG/24 HOURS) 14 mg/24hr patch, Place 1 patch (14 mg total) onto the skin daily. (Patient taking differently: Place 14 mg onto the skin daily as needed (smoking cessation). ), Disp: 28 patch, Rfl: 0 .  nitrofurantoin (MACRODANTIN) 100 MG capsule, Take 100 mg by mouth 2 (two) times daily., Disp: , Rfl:  .  ondansetron (ZOFRAN) 8 MG tablet, Take 1 tablet (8 mg total) by mouth 2 (two) times daily as needed. Start on the third day after chemotherapy. (Patient taking differently: Take 8 mg by mouth 2 (two) times daily as needed for nausea or vomiting.  Start on the third day after chemotherapy.), Disp: 30 tablet, Rfl: 1 .  oxyCODONE (OXY IR/ROXICODONE) 5 MG immediate release tablet, Take 1 tablet (5 mg total) by mouth every 6 (six) hours as needed for severe pain., Disp: 60 tablet, Rfl: 0 .  varenicline (CHANTIX STARTING MONTH PAK) 0.5 MG X 11 & 1 MG X 42 tablet, TAKE AS DIRECTED ON  PACKAGE  INSTRUCTIONS, Disp: 53 tablet, Rfl: 0

## 2018-09-27 NOTE — Progress Notes (Signed)
Spoke to patient and patient stated that she does not have a ride to come to this appointment today 09/27/2018 at 1130 am.

## 2018-09-27 NOTE — Patient Instructions (Addendum)
Advanced emphysema: Continue Symbicort 2 puffs twice a day until we can get you a prescription for Trelegy Once you have the prescription for Trelegy take 1 puff daily no matter how you feel Use albuterol as needed for chest tightness wheezing or shortness of breath Practice good hand hygiene Stay active  Cigarette smoking Quit Use chantix to quit  Shortness of breath: We will check your oxygen level while walking  Please come back to see [place] in 1 to 2 weeks with a nurse practitioner

## 2018-09-28 ENCOUNTER — Ambulatory Visit: Payer: Medicare Other | Admitting: Family

## 2018-09-28 NOTE — Patient Instructions (Addendum)
Donna Curry  09/28/2018   Your procedure is scheduled on: 10/03/2018   Report to [facility] Main  Entrance  Report to admitting at     [place] AM    Call this number if you have problems the morning of surgery [phone]   Remember: Do not eat food or drink liquids :After Midnight. BRUSH YOUR TEETH MORNING OF SURGERY AND RINSE YOUR MOUTH OUT, NO CHEWING GUM CANDY OR MINTS.     Take these medicines the morning of surgery with A SIP OF WATER: Inhalers as usual and bring, Nebulizer if needed, Amlodipine ( Norvasc)                                 You may not have any metal on your body including hair pins and              piercings  Do not wear jewelry, make-up, lotions, powders or perfumes, deodorant             Do not wear nail polish.  Do not shave  48 hours prior to surgery.                Do not bring valuables to the hospital. [place].  Contacts, dentures or bridgework may not be worn into surgery. .     Patients discharged the day of surgery will not be allowed to drive home. IF YOU ARE HAVING SURGERY AND GOING HOME THE SAME DAY, YOU MUST HAVE AN ADULT TO DRIVE YOU HOME AND BE WITH YOU FOR 24 HOURS. YOU MAY GO HOME BY TAXI OR UBER OR ORTHERWISE, BUT AN ADULT MUST ACCOMPANY YOU HOME AND STAY WITH YOU FOR 24 HOURS.  Name and phone number of your driver:               Please read over the following fact sheets you were given: _____________________________________________________________________             [facility] - Preparing for Surgery Before surgery, you can play an important role.  Because skin is not sterile, your skin needs to be as free of germs as possible.  You can reduce the number of germs on your skin by washing with CHG (chlorahexidine gluconate) soap before surgery.  CHG is an antiseptic cleaner which kills germs and bonds with the skin to continue killing germs even after  washing. Please DO NOT use if you have an allergy to CHG or antibacterial soaps.  If your skin becomes reddened/irritated stop using the CHG and inform your nurse when you arrive at Short Stay. Do not shave (including legs and underarms) for at least 48 hours prior to the first CHG shower.  You may shave your face/neck. Please follow these instructions carefully:  1.  Shower with CHG Soap the night before surgery and the  morning of Surgery.  2.  If you choose to wash your hair, wash your hair first as usual with your  normal  shampoo.  3.  After you shampoo, rinse your hair and body thoroughly to remove the  shampoo.  4.  Use CHG as you would any other liquid soap.  You can apply chg directly  to the skin and wash                       Gently with a scrungie or clean washcloth.  5.  Apply the CHG Soap to your body ONLY FROM THE NECK DOWN.   Do not use on face/ open                           Wound or open sores. Avoid contact with eyes, ears mouth and genitals (private parts).                       Wash face,  Genitals (private parts) with your normal soap.             6.  Wash thoroughly, paying special attention to the area where your surgery  will be performed.  7.  Thoroughly rinse your body with warm water from the neck down.  8.  DO NOT shower/wash with your normal soap after using and rinsing off  the CHG Soap.                9.  Pat yourself dry with a clean towel.            10.  Wear clean pajamas.            11.  Place clean sheets on your bed the night of your first shower and do not  sleep with pets. Day of Surgery : Do not apply any lotions/deodorants the morning of surgery.  Please wear clean clothes to the hospital/surgery center.  FAILURE TO FOLLOW THESE INSTRUCTIONS MAY RESULT IN THE CANCELLATION OF YOUR SURGERY PATIENT SIGNATURE_________________________________  NURSE  SIGNATURE__________________________________  ________________________________________________________________________

## 2018-10-01 ENCOUNTER — Ambulatory Visit: Payer: Medicare Other

## 2018-10-01 MED ORDER — FLUTICASONE-UMECLIDIN-VILANT 100-62.5-25 MCG/INH IN AEPB: 1.0000 | INHALATION_SPRAY | Freq: Every day | RESPIRATORY_TRACT | 1 refills | Status: DC

## 2018-10-01 NOTE — Addendum Note (Signed)
Addended by: Valerie Salts on: 10/01/2018 02:39 PM   Modules accepted: Orders

## 2018-10-02 ENCOUNTER — Other Ambulatory Visit: Payer: Self-pay

## 2018-10-02 ENCOUNTER — Encounter (HOSPITAL_COMMUNITY): Payer: Self-pay

## 2018-10-02 ENCOUNTER — Encounter (HOSPITAL_COMMUNITY): Payer: Self-pay | Admitting: *Deleted

## 2018-10-02 ENCOUNTER — Encounter (HOSPITAL_COMMUNITY): Admission: RE | Admit: 2018-10-02 | Payer: Medicare Other | Source: Ambulatory Visit

## 2018-10-02 ENCOUNTER — Inpatient Hospital Stay (HOSPITAL_COMMUNITY)
Admission: RE | Admit: 2018-10-02 | Discharge: 2018-10-02 | Disposition: A | Discharge status: Home / Self Care | Payer: Medicare Other | Source: Ambulatory Visit

## 2018-10-02 HISTORY — DX: Unspecified asthma, uncomplicated: J45.909

## 2018-10-02 HISTORY — DX: Dyspnea, unspecified: R06.00

## 2018-10-02 NOTE — Progress Notes (Signed)
Anesthesia Chart Review   Case:  [id number] Date/Time:  10/03/18 0930   Procedure:  TRANSURETHRAL RESECTION OF BLADDER TUMOR (TURBT) (N/A )   Anesthesia type:  General   Pre-op diagnosis:  BLADDER CANCER   Location:  [place] / [facility]   Surgeon:  [name], MD      DISCUSSION: 63 yo current some day smoker with h/o HTN, anemia, asthma, centrilobular emphysema, bladder cancer scheduled for above procedure 10/03/18 with Dr. Link Snuffer.    Last seen by pulmonologist Dr. Simonne Maffucci on 09/27/18.  At this visit she was started on Trelegy due to advanced emphysema and shortness of breath.    Pt can proceed with planned procedure after evaluation DOS (same day workup).  VS: There were no vitals taken for this visit.  PROVIDERS: [name], NP  Simonne Maffucci, MD is pulmonologist  LABS: Labs DOS (all labs ordered are listed, but only abnormal results are displayed)  Labs Reviewed - No data to display   IMAGES: CT Abdomen 09/03/18 IMPRESSION: 2.4 x 2.9 cm necrotic mass along the right anterolateral bladder wall, stable versus mildly increased from prior PET, likely reflecting residual bladder tumor.  Stable thickening of the bilateral adrenal glands, without hypermetabolism on prior PET, likely benign.  EKG: 08/31/2018 Rate 115 bpm Sinus tachycardia Biatrial enlargement Pulmonary disease pattern  No significant change since last tracing   CV: Echo 09/02/2018 IMPRESSIONS   1. The left ventricle has hyperdynamic systolic function of >62%. The cavity size was normal. There is no increased left ventricular wall thickness. Left ventricular diastology could not be evaluated due to nondiagnostic images.  2. The right ventricle has normal systolic function. The cavity was normal. There is no increase in right ventricular wall thickness.  3. The mitral valve is normal in structure.  4. The tricuspid valve is normal in structure.  5. The aortic valve  is tricuspid There is mild thickening and mild calcification of the aortic valve.  6. The pulmonic valve was normal in structure. Past Medical History:  Diagnosis Date  . Anemia   . Asthma   . Dyspnea    with exertion   . Family history of adverse reaction to anesthesia    brother wakes up and dont know who he is and sister has n/v  . Goals of care, counseling/discussion 02/09/2018  . History of blood transfusion   . Hypertension   . Iron deficiency anemia due to chronic blood loss 03/01/2018  . Measles as a child  . Mumps as a child  . TIA (transient ischemic attack)   . Tobacco abuse     Past Surgical History:  Procedure Laterality Date  . CYSTOSCOPY W/ URETERAL STENT PLACEMENT Right 12/13/2017   Procedure: CYSTOSCOPY WITH RIGHT RETROGRADE PYELOGRAM/URETERAL RIGHT STENT PLACEMENT;  Surgeon: [name], MD;  Location: [facility];  Service: Urology;  Laterality: Right;  . IR IMAGING GUIDED PORT INSERTION  02/23/2018  . TRANSURETHRAL RESECTION OF BLADDER TUMOR N/A 12/13/2017   Procedure: TRANSURETHRAL RESECTION OF BLADDER TUMOR (TURBT);  Surgeon: [name], MD;  Location: [facility];  Service: Urology;  Laterality: N/A;  . TRANSURETHRAL RESECTION OF BLADDER TUMOR WITH GYRUS (TURBT-GYRUS)     [name] 12-13-17  . tubes tied    . uterine ablation     about 2005    MEDICATIONS: No current facility-administered medications for this encounter.    Marland Kitchen acetaminophen (TYLENOL) 500 MG tablet  . albuterol (PROVENTIL) (2.5  MG/3ML) 0.083% nebulizer solution  . amLODipine (NORVASC) 10 MG tablet  . aspirin EC 81 MG tablet  . budesonide-formoterol (SYMBICORT) 160-4.5 MCG/ACT inhaler  . ergocalciferol (VITAMIN D2) 1.25 MG (50000 UT) capsule  . Ipratropium-Albuterol (COMBIVENT) 20-100 MCG/ACT AERS respimat  . lidocaine-prilocaine (EMLA) cream  . MARINOL 2.5 MG capsule  . nicotine (NICODERM CQ - DOSED IN MG/24 HOURS) 14 mg/24hr patch  . nitrofurantoin (MACRODANTIN) 100 MG capsule  .  ondansetron (ZOFRAN) 8 MG tablet  . oxyCODONE (OXY IR/ROXICODONE) 5 MG immediate release tablet  . varenicline (CHANTIX STARTING MONTH PAK) 0.5 MG X 11 & 1 MG X 42 tablet  . albuterol (PROVENTIL HFA;VENTOLIN HFA) 108 (90 Base) MCG/ACT inhaler  . Fluticasone-Umeclidin-[facility] ELLIPTA) 100-62.5-25 MCG/INH AEPB     Maia Plan [facility] Pre-Surgical Testing [phone] 10/02/18 2:03 PM

## 2018-10-02 NOTE — Anesthesia Preprocedure Evaluation (Addendum)
Anesthesia Evaluation  Patient identified by MRN, date of birth, ID band Patient awake    Reviewed: Allergy & Precautions, NPO status , Patient's Chart, lab work & pertinent test results  History of Anesthesia Complications Negative for: history of anesthetic complications  Airway Mallampati: II  TM Distance: >3 FB Neck ROM: Full    Dental  (+) Edentulous Upper, Edentulous Lower   Pulmonary COPD,  COPD inhaler, Current Smoker,    breath sounds clear to auscultation       Cardiovascular hypertension, Pt. on medications (-) angina Rhythm:Regular Rate:Normal  09/02/2018 ECHO: EF >65%   Neuro/Psych TIA   GI/Hepatic negative GI ROS, Neg liver ROS,   Endo/Other  negative endocrine ROS  Renal/GU negative Renal ROS     Musculoskeletal  (+) Arthritis ,   Abdominal   Peds  Hematology  (+) Blood dyscrasia (Hb 9.6), anemia ,   Anesthesia Other Findings   Reproductive/Obstetrics                           Anesthesia Physical Anesthesia Plan  ASA: III  Anesthesia Plan: General   Post-op Pain Management:    Induction: Intravenous  PONV Risk Score and Plan: 2 and Ondansetron  Airway Management Planned: Oral ETT  Additional Equipment:   Intra-op Plan:   Post-operative Plan: Extubation in OR  Informed Consent: I have reviewed the patients History and Physical, chart, labs and discussed the procedure including the risks, benefits and alternatives for the proposed anesthesia with the patient or authorized representative who has indicated his/her understanding and acceptance.       Plan Discussed with: CRNA and Surgeon  Anesthesia Plan Comments: (See PAT note 10/02/18, Konrad Felix, PA-C)      Anesthesia Quick Evaluation

## 2018-10-03 ENCOUNTER — Ambulatory Visit: Payer: Medicare Other | Admitting: Family

## 2018-10-03 ENCOUNTER — Ambulatory Visit (HOSPITAL_COMMUNITY): Payer: Medicare Other | Admitting: Physician Assistant

## 2018-10-03 ENCOUNTER — Ambulatory Visit (HOSPITAL_COMMUNITY)
Admission: RE | Admit: 2018-10-03 | Discharge: 2018-10-03 | Disposition: A | Discharge status: Home / Self Care | Payer: Medicare Other | Source: Ambulatory Visit | Attending: Urology | Admitting: Urology

## 2018-10-03 ENCOUNTER — Encounter (HOSPITAL_COMMUNITY)
Admission: RE | Disposition: A | Discharge status: Home / Self Care | Payer: Self-pay | Source: Ambulatory Visit | Attending: Urology

## 2018-10-03 ENCOUNTER — Other Ambulatory Visit: Payer: Self-pay

## 2018-10-03 ENCOUNTER — Encounter (HOSPITAL_COMMUNITY): Payer: Self-pay

## 2018-10-03 DIAGNOSIS — C678 Malignant neoplasm of overlapping sites of bladder: Secondary | ICD-10-CM | POA: Diagnosis not present

## 2018-10-03 DIAGNOSIS — C679 Malignant neoplasm of bladder, unspecified: Secondary | ICD-10-CM | POA: Diagnosis not present

## 2018-10-03 DIAGNOSIS — M1990 Unspecified osteoarthritis, unspecified site: Secondary | ICD-10-CM | POA: Diagnosis not present

## 2018-10-03 DIAGNOSIS — Z79899 Other long term (current) drug therapy: Secondary | ICD-10-CM | POA: Insufficient documentation

## 2018-10-03 DIAGNOSIS — Z8673 Personal history of transient ischemic attack (TIA), and cerebral infarction without residual deficits: Secondary | ICD-10-CM | POA: Diagnosis not present

## 2018-10-03 DIAGNOSIS — F17200 Nicotine dependence, unspecified, uncomplicated: Secondary | ICD-10-CM | POA: Insufficient documentation

## 2018-10-03 DIAGNOSIS — I10 Essential (primary) hypertension: Secondary | ICD-10-CM | POA: Insufficient documentation

## 2018-10-03 DIAGNOSIS — J449 Chronic obstructive pulmonary disease, unspecified: Secondary | ICD-10-CM | POA: Insufficient documentation

## 2018-10-03 DIAGNOSIS — Z7982 Long term (current) use of aspirin: Secondary | ICD-10-CM | POA: Diagnosis not present

## 2018-10-03 HISTORY — PX: TRANSURETHRAL RESECTION OF BLADDER TUMOR: SHX2575

## 2018-10-03 HISTORY — DX: Personal history of other medical treatment: Z92.89

## 2018-10-03 LAB — CBC
HCT: 32.3 % — ABNORMAL LOW (ref 36.0–46.0)
Hemoglobin: 9.6 g/dL — ABNORMAL LOW (ref 12.0–15.0)
MCH: 31 pg (ref 26.0–34.0)
MCHC: 29.7 g/dL — ABNORMAL LOW (ref 30.0–36.0)
MCV: 104.2 fL — ABNORMAL HIGH (ref 80.0–100.0)
Platelets: 265 10*3/uL (ref 150–400)
RBC: 3.1 MIL/uL — ABNORMAL LOW (ref 3.87–5.11)
RDW: 16.5 % — ABNORMAL HIGH (ref 11.5–15.5)
WBC: 6.6 10*3/uL (ref 4.0–10.5)
nRBC: 0 % (ref 0.0–0.2)

## 2018-10-03 LAB — BASIC METABOLIC PANEL
Anion gap: 12 (ref 5–15)
BUN: 6 mg/dL — ABNORMAL LOW (ref 8–23)
CO2: 25 mmol/L (ref 22–32)
Calcium: 9.3 mg/dL (ref 8.9–10.3)
Chloride: 101 mmol/L (ref 98–111)
Creatinine, Ser: 0.91 mg/dL (ref 0.44–1.00)
GFR calc Af Amer: >60 mL/min (ref >60)
GFR calc non Af Amer: >60 mL/min (ref >60)
Glucose, Bld: 105 mg/dL — ABNORMAL HIGH (ref 70–99)
Potassium: 3.8 mmol/L (ref 3.5–5.1)
Sodium: 138 mmol/L (ref 135–145)

## 2018-10-03 SURGERY — TURBT (TRANSURETHRAL RESECTION OF BLADDER TUMOR)
Anesthesia: General

## 2018-10-03 MED ORDER — SUGAMMADEX SODIUM 200 MG/2ML IV SOLN
INTRAVENOUS | Status: DC | PRN
  Administered 2018-10-03: 200 mg via INTRAVENOUS

## 2018-10-03 MED ORDER — ALBUTEROL SULFATE (2.5 MG/3ML) 0.083% IN NEBU
INHALATION_SOLUTION | RESPIRATORY_TRACT | Status: AC
  Filled 2018-10-03: qty 3

## 2018-10-03 MED ORDER — CEFAZOLIN SODIUM-DEXTROSE 2-4 GM/100ML-% IV SOLN
2.0000 g | INTRAVENOUS | Status: AC
  Administered 2018-10-03: 2 g via INTRAVENOUS
  Filled 2018-10-03: qty 100

## 2018-10-03 MED ORDER — ALBUTEROL SULFATE (2.5 MG/3ML) 0.083% IN NEBU
2.5000 mg | INHALATION_SOLUTION | Freq: Once | RESPIRATORY_TRACT | Status: AC
  Administered 2018-10-03: 2.5 mg via RESPIRATORY_TRACT

## 2018-10-03 MED ORDER — MEPERIDINE HCL 50 MG/ML IJ SOLN: 6.2500 mg | INTRAMUSCULAR | Status: DC | PRN

## 2018-10-03 MED ORDER — HYDROCODONE-ACETAMINOPHEN 5-325 MG PO TABS
0.5000 | ORAL_TABLET | Freq: Once | ORAL | Status: AC
  Administered 2018-10-03: 0.5 via ORAL

## 2018-10-03 MED ORDER — 0.9 % SODIUM CHLORIDE (POUR BTL) OPTIME
TOPICAL | Status: DC | PRN
  Administered 2018-10-03: 1000 mL

## 2018-10-03 MED ORDER — HYDROCODONE-ACETAMINOPHEN 5-325 MG PO TABS
ORAL_TABLET | ORAL | Status: AC
  Filled 2018-10-03: qty 1

## 2018-10-03 MED ORDER — FENTANYL CITRATE (PF) 100 MCG/2ML IJ SOLN
INTRAMUSCULAR | Status: AC
  Filled 2018-10-03: qty 2

## 2018-10-03 MED ORDER — ROCURONIUM BROMIDE 10 MG/ML (PF) SYRINGE
PREFILLED_SYRINGE | INTRAVENOUS | Status: DC | PRN
  Administered 2018-10-03: 40 mg via INTRAVENOUS

## 2018-10-03 MED ORDER — LIDOCAINE 2% (20 MG/ML) 5 ML SYRINGE
INTRAMUSCULAR | Status: AC
  Filled 2018-10-03: qty 5

## 2018-10-03 MED ORDER — PHENYLEPHRINE 40 MCG/ML (10ML) SYRINGE FOR IV PUSH (FOR BLOOD PRESSURE SUPPORT)
PREFILLED_SYRINGE | INTRAVENOUS | Status: AC
  Filled 2018-10-03: qty 10

## 2018-10-03 MED ORDER — MIDAZOLAM HCL 2 MG/2ML IJ SOLN: 0.5000 mg | Freq: Once | INTRAMUSCULAR | Status: DC | PRN

## 2018-10-03 MED ORDER — FENTANYL CITRATE (PF) 100 MCG/2ML IJ SOLN
INTRAMUSCULAR | Status: DC | PRN
  Administered 2018-10-03: 50 ug via INTRAVENOUS

## 2018-10-03 MED ORDER — PROPOFOL 10 MG/ML IV BOLUS
INTRAVENOUS | Status: DC | PRN
  Administered 2018-10-03: 40 mg via INTRAVENOUS
  Administered 2018-10-03: 120 mg via INTRAVENOUS

## 2018-10-03 MED ORDER — SODIUM CHLORIDE 0.9 % IR SOLN
Status: DC | PRN
  Administered 2018-10-03: 12000 mL via INTRAVESICAL

## 2018-10-03 MED ORDER — FENTANYL CITRATE (PF) 100 MCG/2ML IJ SOLN
25.0000 ug | INTRAMUSCULAR | Status: DC | PRN
  Administered 2018-10-03 (×2): 50 ug via INTRAVENOUS

## 2018-10-03 MED ORDER — PROMETHAZINE HCL 25 MG/ML IJ SOLN: 6.2500 mg | INTRAMUSCULAR | Status: DC | PRN

## 2018-10-03 MED ORDER — LIDOCAINE 2% (20 MG/ML) 5 ML SYRINGE
INTRAMUSCULAR | Status: DC | PRN
  Administered 2018-10-03: 20 mg via INTRAVENOUS

## 2018-10-03 MED ORDER — PHENYLEPHRINE 40 MCG/ML (10ML) SYRINGE FOR IV PUSH (FOR BLOOD PRESSURE SUPPORT)
PREFILLED_SYRINGE | INTRAVENOUS | Status: DC | PRN
  Administered 2018-10-03 (×2): 80 ug via INTRAVENOUS

## 2018-10-03 MED ORDER — LACTATED RINGERS IV SOLN
INTRAVENOUS | Status: DC
  Administered 2018-10-03 (×2): via INTRAVENOUS

## 2018-10-03 MED ORDER — ONDANSETRON HCL 4 MG/2ML IJ SOLN
INTRAMUSCULAR | Status: DC | PRN
  Administered 2018-10-03: 4 mg via INTRAVENOUS

## 2018-10-03 MED ORDER — SUGAMMADEX SODIUM 500 MG/5ML IV SOLN
INTRAVENOUS | Status: AC
  Filled 2018-10-03: qty 5

## 2018-10-03 MED ORDER — ROCURONIUM BROMIDE 10 MG/ML (PF) SYRINGE
PREFILLED_SYRINGE | INTRAVENOUS | Status: AC
  Filled 2018-10-03: qty 10

## 2018-10-03 MED ORDER — DEXAMETHASONE SODIUM PHOSPHATE 10 MG/ML IJ SOLN
INTRAMUSCULAR | Status: AC
  Filled 2018-10-03: qty 1

## 2018-10-03 MED ORDER — ONDANSETRON HCL 4 MG/2ML IJ SOLN
INTRAMUSCULAR | Status: AC
  Filled 2018-10-03: qty 2

## 2018-10-03 MED ORDER — HYDROCODONE-ACETAMINOPHEN 5-325 MG PO TABS: 1.0000 | ORAL_TABLET | ORAL | 0 refills | Status: DC | PRN

## 2018-10-03 MED ORDER — DEXAMETHASONE SODIUM PHOSPHATE 4 MG/ML IJ SOLN
INTRAMUSCULAR | Status: DC | PRN
  Administered 2018-10-03: 10 mg via INTRAVENOUS

## 2018-10-03 SURGICAL SUPPLY — 14 items
BAG URINE DRAINAGE (UROLOGICAL SUPPLIES) IMPLANT
BAG URO CATCHER STRL LF (MISCELLANEOUS) ×2 IMPLANT
COVER WAND RF STERILE (DRAPES) IMPLANT
ELECT REM PT RETURN 15FT ADLT (MISCELLANEOUS) ×1 IMPLANT
GLOVE BIO SURGEON STRL SZ7.5 (GLOVE) ×2 IMPLANT
GOWN STRL REUS W/TWL LRG LVL3 (GOWN DISPOSABLE) ×2 IMPLANT
KIT TURNOVER KIT A (KITS) IMPLANT
LOOP CUT BIPOLAR 24F LRG (ELECTROSURGICAL) ×1 IMPLANT
MANIFOLD NEPTUNE II (INSTRUMENTS) ×2 IMPLANT
PACK CYSTO (CUSTOM PROCEDURE TRAY) ×2 IMPLANT
SET ASPIRATION TUBING (TUBING) IMPLANT
SYRINGE IRR TOOMEY STRL 70CC (SYRINGE) ×1 IMPLANT
TUBING CONNECTING 10 (TUBING) ×2 IMPLANT
TUBING UROLOGY SET (TUBING) ×2 IMPLANT

## 2018-10-03 NOTE — Anesthesia Postprocedure Evaluation (Signed)
Anesthesia Post Note  Patient: Donna Curry  Procedure(s) Performed: TRANSURETHRAL RESECTION OF BLADDER TUMOR (TURBT) (N/A )     Patient location during evaluation: PACU Anesthesia Type: General Level of consciousness: awake and alert, oriented and patient cooperative Pain management: pain level controlled Vital Signs Assessment: post-procedure vital signs reviewed and stable Respiratory status: spontaneous breathing, nonlabored ventilation, respiratory function stable and patient connected to nasal cannula oxygen Cardiovascular status: blood pressure returned to baseline and stable Postop Assessment: no apparent nausea or vomiting Anesthetic complications: no    Last Vitals:  Vitals:   10/03/18 1233 10/03/18 1245  BP:  137/89  Pulse: 96 92  Resp: 13 (!) 8  Temp:    SpO2: (!) 88% 100%    Last Pain:  Vitals:   10/03/18 1145  TempSrc:   PainSc: Asleep                 Kucinski,E. Dorthey Depace

## 2018-10-03 NOTE — Discharge Instructions (Addendum)
Transurethral Resection of Bladder Tumor (TURBT) or Bladder Biopsy   Definition:  Transurethral Resection of the Bladder Tumor is a surgical procedure used to diagnose and remove tumors within the bladder. TURBT is the most common treatment for early stage bladder cancer.  General instructions:     Your recent bladder surgery requires very little post hospital care but some definite precautions.  Despite the fact that no skin incisions were used, the area around the bladder incisions are raw and covered with scabs to promote healing and prevent bleeding. Certain precautions are needed to insure that the scabs are not disturbed over the next 2-4 weeks while the healing proceeds.  Because the raw surface inside your bladder and the irritating effects of urine you may expect frequency of urination and/or urgency (a stronger desire to urinate) and perhaps even getting up at night more often. This will usually resolve or improve slowly over the healing period. You may see some blood in your urine over the first 6 weeks. Do not be alarmed, even if the urine was clear for a while. Get off your feet and drink lots of fluids until clearing occurs. If you start to pass clots or don't improve call us.  Diet:  You may return to your normal diet immediately. Because of the raw surface of your bladder, alcohol, spicy foods, foods high in acid and drinks with caffeine may cause irritation or frequency and should be used in moderation. To keep your urine flowing freely and avoid constipation, drink plenty of fluids during the day (8-10 glasses). Tip: Avoid cranberry juice because it is very acidic.  Activity:  Your physical activity doesn't need to be restricted. However, if you are very active, you may see some blood in the urine. We suggest that you reduce your activity under the circumstances until the bleeding has stopped.  Bowels:  It is important to keep your bowels regular during the postoperative  period. Straining with bowel movements can cause bleeding. A bowel movement every other day is reasonable. Use a mild laxative if needed, such as milk of magnesia 2-3 tablespoons, or 2 Dulcolax tablets. Call if you continue to have problems. If you had been taking narcotics for pain, before, during or after your surgery, you may be constipated. Take a laxative if necessary.    Medication:  You should resume your pre-surgery medications unless told not to. In addition you may be given an antibiotic to prevent or treat infection. Antibiotics are not always necessary. All medication should be taken as prescribed until the bottles are finished unless you are having an unusual reaction to one of the drugs.   General Anesthesia, Adult, Care After This sheet gives you information about how to care for yourself after your procedure. Your health care provider may also give you more specific instructions. If you have problems or questions, contact your health care provider. What can I expect after the procedure? After the procedure, the following side effects are common:  Pain or discomfort at the IV site.  Nausea.  Vomiting.  Sore throat.  Trouble concentrating.  Feeling cold or chills.  Weak or tired.  Sleepiness and fatigue.  Soreness and body aches. These side effects can affect parts of the body that were not involved in surgery. Follow these instructions at home:  For at least 24 hours after the procedure:  Have a responsible adult stay with you. It is important to have someone help care for you until you are awake and alert.  Rest as needed.  Do not: ? Participate in activities in which you could fall or become injured. ? Drive. ? Use heavy machinery. ? Drink alcohol. ? Take sleeping pills or medicines that cause drowsiness. ? Make important decisions or sign legal documents. ? Take care of children on your own. Eating and drinking  Follow any instructions from your  health care provider about eating or drinking restrictions.  When you feel hungry, start by eating small amounts of foods that are soft and easy to digest (bland), such as toast. Gradually return to your regular diet.  Drink enough fluid to keep your urine pale yellow.  If you vomit, rehydrate by drinking water, juice, or clear broth. General instructions  If you have sleep apnea, surgery and certain medicines can increase your risk for breathing problems. Follow instructions from your health care provider about wearing your sleep device: ? Anytime you are sleeping, including during daytime naps. ? While taking prescription pain medicines, sleeping medicines, or medicines that make you drowsy.  Return to your normal activities as told by your health care provider. Ask your health care provider what activities are safe for you.  Take over-the-counter and prescription medicines only as told by your health care provider.  If you smoke, do not smoke without supervision.  Keep all follow-up visits as told by your health care provider. This is important. Contact a health care provider if:  You have nausea or vomiting that does not get better with medicine.  You cannot eat or drink without vomiting.  You have pain that does not get better with medicine.  You are unable to pass urine.  You develop a skin rash.  You have a fever.  You have redness around your IV site that gets worse. Get help right away if:  You have difficulty breathing.  You have chest pain.  You have blood in your urine or stool, or you vomit blood. Summary  After the procedure, it is common to have a sore throat or nausea. It is also common to feel tired.  Have a responsible adult stay with you for the first 24 hours after general anesthesia. It is important to have someone help care for you until you are awake and alert.  When you feel hungry, start by eating small amounts of foods that are soft and easy  to digest (bland), such as toast. Gradually return to your regular diet.  Drink enough fluid to keep your urine pale yellow.  Return to your normal activities as told by your health care provider. Ask your health care provider what activities are safe for you. This information is not intended to replace advice given to you by your health care provider. Make sure you discuss any questions you have with your health care provider. Document Released: 10/17/2000 Document Revised: 02/24/2017 Document Reviewed: 02/24/2017 Elsevier Interactive Patient Education  2019 [employer].

## 2018-10-03 NOTE — Progress Notes (Signed)
PACU nsg note:  May discharge pt to home with sats of 90% RA per Dr. Isabell Jarvis.

## 2018-10-03 NOTE — H&P (Signed)
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. No overt metastatic disease. 11/2017 Cr 1.2 with Rt stent.   Today "Donna Curry" is seen with complaints of gross hematuria. She was scheduled to follow up with Dr. Tresa Moore in July to discuss possible cystectomy, but did not come for that visit. She was seen by Dr. Margurite Auerbach and per his note, he did not feel she would be a good candidate for radical cystectomy. She also states that she did not want urostomy bag. She has been undergoing combination of radiation therapy and chemotherapy. She had repeat PET imaging on 12/12, which revealed no findings to suggest FDG avid metastatic disease. There was an abnormal wall thickening involving the right anterolateral bladder with corresponding increased uptake. This was suspicious for residual tumor. Per notes from Dr. Casper Harrison office, he recommends repeat cystoscopy. She continues to have right ureteral stent in place, which appears to remain well positioned and there is no hydronephrosis noted. Today, she states that gross hematuria has subdied. She denies any current difficulties voiding and feels that she empties well. She denies dysuria, fevers, or chills. No recent weight loss or loss of appetite. Next follow up with Dr. Margurite Auerbach is on 12/24. Creatinine on 12/6 noted to be 0.88.    08/31/2018  Patient elected against cystectomy and elected for bladder preservation therapy with combination radiation/chemotherapy. She recently underwent a PET scan on 07/05/2018 that revealed no obvious findings of metastatic disease. There was some abnormal wall thickening of the right anterior lateral bladder with increased uptake suspicious for residual tumor. She has a right ureteral stent in place that was placed back in May. She has failed to follow-up with me as well as Dr.  Tresa Moore as directed. She needs to have this exchanged.     ALLERGIES: No Known Drug Allergies    MEDICATIONS: Amlodipine Besylate 10 mg tablet  Aspir 81  Cephalexin  Diclofenac Sodium 75 mg tablet, delayed release  Iron  Lorazepam  Vitamin D2     GU PSH: Cystoscopy Insert Stent, Right - 12/13/2017 Cystoscopy TURBT >5 cm - 12/13/2017 Locm 300-399Mg /Ml Iodine,1Ml - 12/26/2017    NON-GU PSH: None   GU PMH: Hydronephrosis (Chronic), Right - 01/22/2018 Bladder Cancer overlapping sites - 12/21/2017 Bladder Cancer Lateral - 11/24/2017    NON-GU PMH: Hypertension    FAMILY HISTORY: 1 Daughter - Other Diabetes - Father HIV Infection - Brother Hypertension - Mother   SOCIAL HISTORY: Marital Status: Single Preferred Language: English; Race: Black or African American Current Smoking Status: Patient smokes.   Tobacco Use Assessment Completed: Used Tobacco in last 30 days? Does not drink caffeine.    REVIEW OF SYSTEMS:    GU Review Female:   Patient denies frequent urination, hard to postpone urination, burning /pain with urination, get up at night to urinate, leakage of urine, stream starts and stops, trouble starting your stream, have to strain to urinate, and being pregnant.  Gastrointestinal (Upper):   Patient denies nausea, vomiting, and indigestion/ heartburn.  Gastrointestinal (Lower):   Patient denies diarrhea and constipation.  Constitutional:   Patient denies fever, night sweats, weight loss, and fatigue.  Skin:   Patient denies skin rash/ lesion and itching.  Eyes:   Patient denies blurred vision and double vision.  Ears/ Nose/ Throat:   Patient denies sore throat and sinus problems.  Hematologic/Lymphatic:  Patient denies swollen glands and easy bruising.  Cardiovascular:   Patient denies leg swelling and chest pains.  Respiratory:   Patient reports shortness of breath. Patient denies cough.  Endocrine:   Patient denies excessive thirst.  Musculoskeletal:   Patient denies  back pain and joint pain.  Neurological:   Patient denies headaches and dizziness.  Psychologic:   Patient denies depression and anxiety.   Notes: pt. is having a hard time breathing today    VITAL SIGNS:      08/31/2018 11:50 AM  Weight 78 lb / 35.38 kg  BP 118/77 mmHg  Heart Rate 111 /min  Temperature 98.3 F / 36.8 C   PAST DATA REVIEWED:  Source Of History:  Patient  X-Ray Review: PET Scan: Reviewed Films. Reviewed Report. Discussed With Patient.     PROCEDURES:         Flexible Cystoscopy - 52000  Risks, benefits, and some of the potential complications of the procedure were discussed at length with the patient including infection, bleeding, voiding discomfort, urinary retention, fever, chills, sepsis, and others. All questions were answered. Informed consent was obtained. Antibiotic prophylaxis was given. Sterile technique and intraurethral analgesia were used.  Meatus:  Normal size. Normal location. Normal condition.  Urethra:  Normal urethra  Ureteral Orifices:  Normal location. Normal size. Normal shape. Right ureteral stent in place. No obvious tumor around the ureteral orifice.  Bladder:  No trabeculation. High-grade appearing bladder tumor approximately 4-5 cm on the right anterior lateral wall. Approximately 3 cm bladder tumor on the posterior bladder wall.      The lower urinary tract was carefully examined. The procedure was well-tolerated and without complications. Antibiotic instructions were given. Instructions were given to call the office immediately for bloody urine, difficulty urinating, urinary retention, painful or frequent urination, fever, chills, nausea, vomiting or other illness. The patient stated that she understood these instructions and would comply with them.         Urinalysis w/Scope Dipstick Dipstick Cont'd Micro  Color: Yellow Bilirubin: Neg mg/dL WBC/hpf: 20 - 40/hpf  Appearance: Cloudy Ketones: Neg mg/dL RBC/hpf: >60/hpf  Specific Gravity:  1.025 Blood: 3+ ery/uL Bacteria: Many (>50/hpf)  pH: 5.5 Protein: 2+ mg/dL Cystals: NS (Not Seen)  Glucose: Neg mg/dL Urobilinogen: 0.2 mg/dL Casts: NS (Not Seen)    Nitrites: Neg Trichomonas: Present    Leukocyte Esterase: 3+ leu/uL Mucous: Not Present      Epithelial Cells: 10 - 20/hpf      Yeast: NS (Not Seen)      Sperm: Not Present    ASSESSMENT:      ICD-10 Details  1 GU:   Bladder Cancer overlapping sites - C67.8   2   Hydronephrosis - N13.0 Right     PLAN:            Medications New Meds: Metronidazole 500 mg tablet 4 tablet PO Daily Take 4 tablets once  #4  0 Refill(s)            Orders Labs Urine Culture          Document Letter(s):  Created for Patient: Clinical Summary         Notes:   Patient has obvious residual disease. Options at this point would be radical cystectomy with bilateral pelvic lymphadenectomy with ileal conduit. She does have relatively poor performance status which may preclude her from this. However, this would be the treatment option of choice for curative intent.   Other option would be  debulking with maximal TURBT.   Plan to discuss her case at the upcoming tumor board. Whatever the decision, she is due for a ureteral stent removal versus exchange.   Metronidazole 2 g 1 for Trichomonas.   Cc: Mackie Pai, PA.  Dr. Burney Gauze, M.D.      Signed by Link Snuffer, III, M.D. on 08/31/18 at 12:25 PM (EST

## 2018-10-03 NOTE — Op Note (Signed)
Operative Note  Preoperative diagnosis:  1.  Bladder cancer  Postoperative diagnosis: 1.  Bladder cancer--large greater than 5 cm  Procedure(s): 1.  Transurethral resection of bladder tumor--large  Surgeon: Link Snuffer, MD  Assistants: None  Anesthesia: General  Complications: None immediate  EBL: Minimal  Specimens: 1.  Bladder tumor  Drains/Catheters: 1.  None  Intraoperative findings: 1.  Normal urethra 2.  Bilateral ureteral orifices were visualized and were well away from the tumor. 3.  Significant decrease in the bulkiness and size of the bladder tumor that was initially resected.  This indicates a positive response to chemotherapy.  There was obvious residual disease that was still high-volume, about 7 cm all located on the right lateral wall.  There was some firm calcification and possible necrotic tissue that hindered resection in one area.  It appeared that the mass was muscle invasive and it is unlikely that all tumor was resected however I performed a maximum debulking of the tumor within limitations.  Superficial portions of the tumor were resected.  Indication: 63 year old female with a history of T2 bladder cancer status post chemoradiation.  She was unable to completely tolerate chemotherapy.  She however did have a good response.  She has multiple medical comorbidities, specifically severe COPD and has difficulty with shortness of breath with minimal exertion.  She was presented at tumor board and it was decided that she is not fit for a cystectomy.  She may be a candidate for PDL 1 inhibitors.  Therefore, she presents for repeat resection with maximal debulking and to obtain tissue for pathology.  Description of procedure:  The patient was identified and consent was obtained.  The patient was taken to the operating room and placed in the supine position.  The patient was placed under general anesthesia.  Perioperative antibiotics were administered.  The patient was  placed in dorsal lithotomy.  Patient was prepped and draped in a standard sterile fashion and a timeout was performed.  A 26 French resectoscope with a visual obturator in place was inserted into the urethra and into the bladder.  Complete cystoscopy was performed with the findings noted above.  She had reported to me that her right ureteral stent had fallen out over the weekend.  It was not realized today.  Bilateral ureteral orifices were normal.  The right ureteral orifice in particular was well away from residual tumor and therefore I decided not to place a ureteral stent.  I turned my attention to the mass of interest after exchanging for the working element.  On bipolar settings, I resected the maximum amount possible endoscopically.  I collected the bladder tumor specimen.  I fulgurated all of the resection bed and some of the surrounding tissue which again was well away from the ureteral orifices.  Once I had performed maximum endoscopic resection, there was good hemostasis obtained and the bladder was drained and the scope was withdrawn.  This concluded the operation.  The patient tolerated the procedure well and was stable postoperatively.  Plan: Follow-up in 1 week for pathology review.  I will contact her oncologist and let him know what we saw today.

## 2018-10-03 NOTE — Transfer of Care (Signed)
Immediate Anesthesia Transfer of Care Note  Patient: Donna Curry  Procedure(s) Performed: TRANSURETHRAL RESECTION OF BLADDER TUMOR (TURBT) (N/A )  Patient Location: PACU  Anesthesia Type:General  Level of Consciousness: awake  Airway & Oxygen Therapy: Patient Spontanous Breathing and Patient connected to face mask oxygen  Post-op Assessment: Report given to RN and Post -op Vital signs reviewed and stable  Post vital signs: Reviewed and stable  Last Vitals:  Vitals Value Taken Time  BP 165/90 10/03/2018 10:55 AM  Temp    Pulse 91 10/03/2018 10:57 AM  Resp 22 10/03/2018 10:57 AM  SpO2 100 % 10/03/2018 10:57 AM  Vitals shown include unvalidated device data.  Last Pain:  Vitals:   10/03/18 0803  TempSrc:   PainSc: 10-Worst pain ever      Patients Stated Pain Goal: 5 (97/94/80 1655)  Complications: No apparent anesthesia complications

## 2018-10-03 NOTE — Interval H&P Note (Signed)
History and Physical Interval Note:  10/03/2018 8:13 AM  Donna Curry  has presented today for surgery, with the diagnosis of BLADDER CANCER.  The various methods of treatment have been discussed with the patient and family. After consideration of risks, benefits and other options for treatment, the patient has consented to  Procedure(s): TRANSURETHRAL RESECTION OF BLADDER TUMOR (TURBT) (N/A) as a surgical intervention.  The patient's history has been reviewed, patient examined, no change in status, stable for surgery.  I have reviewed the patient's chart and labs.  Questions were answered to the patient's satisfaction.     Marton Redwood, III

## 2018-10-03 NOTE — Anesthesia Procedure Notes (Signed)
Procedure Name: Intubation Date/Time: 10/03/2018 9:58 AM Performed by: Lieutenant Diego, CRNA Pre-anesthesia Checklist: Patient identified, Emergency Drugs available, Suction available and Patient being monitored Patient Re-evaluated:Patient Re-evaluated prior to induction Oxygen Delivery Method: Circle system utilized Preoxygenation: Pre-oxygenation with 100% oxygen Induction Type: IV induction Ventilation: Mask ventilation without difficulty Laryngoscope Size: Miller and 2 Grade View: Grade I Tube type: Oral Tube size: 7.0 mm Number of attempts: 1 Airway Equipment and Method: Stylet Placement Confirmation: ETT inserted through vocal cords under direct vision,  positive ETCO2 and breath sounds checked- equal and bilateral Secured at: 22 cm Tube secured with: Tape Dental Injury: Teeth and Oropharynx as per pre-operative assessment

## 2018-10-04 ENCOUNTER — Encounter (HOSPITAL_COMMUNITY): Payer: Self-pay | Admitting: Urology

## 2018-10-08 ENCOUNTER — Ambulatory Visit: Payer: Medicare Other | Admitting: Family

## 2018-10-08 ENCOUNTER — Ambulatory Visit: Payer: Medicare Other

## 2018-10-08 ENCOUNTER — Other Ambulatory Visit: Payer: Self-pay | Admitting: *Deleted

## 2018-10-08 DIAGNOSIS — C670 Malignant neoplasm of trigone of bladder: Secondary | ICD-10-CM

## 2018-10-08 MED ORDER — OXYCODONE HCL 5 MG PO TABS: 5.0000 mg | ORAL_TABLET | Freq: Four times a day (QID) | ORAL | 0 refills | Status: DC | PRN

## 2018-10-09 ENCOUNTER — Telehealth: Payer: Self-pay | Admitting: *Deleted

## 2018-10-09 NOTE — Telephone Encounter (Signed)
Received Home Health Certification and Plan of Care; forwarded to provider/SLS 03/17

## 2018-10-12 ENCOUNTER — Ambulatory Visit (INDEPENDENT_AMBULATORY_CARE_PROVIDER_SITE_OTHER): Payer: Medicare Other | Admitting: Family

## 2018-10-12 ENCOUNTER — Encounter: Payer: Self-pay | Admitting: Family

## 2018-10-12 ENCOUNTER — Other Ambulatory Visit: Payer: Self-pay

## 2018-10-12 VITALS — BP 118/76 | HR 93 | Temp 99.1°F | Resp 16 | Wt 73.0 lb

## 2018-10-12 DIAGNOSIS — F17200 Nicotine dependence, unspecified, uncomplicated: Secondary | ICD-10-CM

## 2018-10-12 DIAGNOSIS — C679 Malignant neoplasm of bladder, unspecified: Secondary | ICD-10-CM | POA: Diagnosis not present

## 2018-10-12 DIAGNOSIS — I10 Essential (primary) hypertension: Secondary | ICD-10-CM

## 2018-10-12 DIAGNOSIS — R634 Abnormal weight loss: Secondary | ICD-10-CM | POA: Diagnosis not present

## 2018-10-12 DIAGNOSIS — E0781 Sick-euthyroid syndrome: Secondary | ICD-10-CM

## 2018-10-12 NOTE — Patient Instructions (Addendum)
Please weigh daily and call if you lose any more weight.  Continue 3 cans of ensure and 3 good meals a day.

## 2018-10-12 NOTE — Progress Notes (Signed)
Subjective:    Patient ID: Donna Curry, female    DOB: 03/31/1956, 63 y.o.   MRN: [id number]  HPI  Patient is a 63 yr old female who presents today for follow-up.  Hypertension-she is maintained on amlodipine 10 mg.  BP Readings from Last 3 Encounters:  10/12/18 118/76  10/03/18 (!) 139/91  09/27/18 104/62    Bladder cancer- s/p transurethral resection of bladder tumor 10/03/18.  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  Weight loss- last visit we discussed eating 3 meals a day and 3-4 cans of ensure. She is maintained on marinol, Eating 3 meals a day + 3 cans of ensure.  Feeling stronger overall.    Wt Readings from Last 3 Encounters:  10/12/18 73 lb (33.1 kg)  10/03/18 73 lb (33.1 kg)  09/21/18 73 lb 12.8 oz (33.5 kg)   Hyperthyroid- noted to have depressed TSH during her last hospitalization. We referred her to endocrinology. 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   COPD- saw her pulmonologist on 09/27/18 who gave her an Rx for Trelegy to be started in place of symbicort. She was also given an rx for chantix. She reports that she is down to 3 cigarettes/day.  Review of Systems See HPI  Past Medical History:  Diagnosis Date  . Anemia   . Asthma   . Dyspnea    with exertion   . Family history of adverse reaction to anesthesia    brother wakes up and dont know who he is and sister has n/v  . Goals of care, counseling/discussion 02/09/2018  . History of blood transfusion   . Hypertension   . Iron deficiency anemia due to chronic blood loss 03/01/2018  . Measles as a child  . Mumps as a child   . TIA (transient ischemic attack)   . Tobacco abuse      Social History   Socioeconomic History  . Marital status: Single    Spouse name: Not on file  . Number of children: Not on file  . Years of education: Not on file  . Highest education level: Not on file  Occupational History  . Not on file  Social Needs  . Financial resource strain: Not on file  . Food insecurity:    Worry: Not on file    Inability: Not on file  . Transportation needs:    Medical: Not on file    Non-medical: Not on file  Tobacco Use  . Smoking status: Current Some Day Smoker    Packs/day: 0.25    Types: Cigarettes  . Smokeless tobacco: Never Used  Substance and Sexual Activity  . Alcohol use: No    Alcohol/week: 0.0 standard drinks  . Drug use: Never  . Sexual activity: Not Currently  Lifestyle  . Physical activity:    Days per week: Not on file    Minutes per session: Not on file  . Stress: Not on file  Relationships  . Social connections:    Talks on phone: Not on file    Gets together: Not on file    Attends religious service: Not on file  Active member of club or organization: Not on file    Attends meetings of clubs or organizations: Not on file    Relationship status: Not on file  . Intimate partner violence:    Fear of current or ex partner: Not on file    Emotionally abused: Not on file    Physically abused: Not on file    Forced sexual activity: Not on file  Other Topics Concern  . Not on file  Social History Narrative   Denies hx of drug use   Single   1 daughter age 32 lives with daughter and grandson who is 17.   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    Past Surgical History:  Procedure Laterality Date  . CYSTOSCOPY W/ URETERAL STENT PLACEMENT Right 12/13/2017   Procedure: CYSTOSCOPY WITH RIGHT RETROGRADE PYELOGRAM/URETERAL RIGHT STENT PLACEMENT;  Surgeon: [name], MD;  Location: [facility];  Service: Urology;  Laterality:  Right;  . IR IMAGING GUIDED PORT INSERTION  02/23/2018  . TRANSURETHRAL RESECTION OF BLADDER TUMOR N/A 12/13/2017   Procedure: TRANSURETHRAL RESECTION OF BLADDER TUMOR (TURBT);  Surgeon: [name], MD;  Location: [facility];  Service: Urology;  Laterality: N/A;  . TRANSURETHRAL RESECTION OF BLADDER TUMOR N/A 10/03/2018   Procedure: TRANSURETHRAL RESECTION OF BLADDER TUMOR (TURBT);  Surgeon: [name], MD;  Location: [facility];  Service: Urology;  Laterality: N/A;  . TRANSURETHRAL RESECTION OF BLADDER TUMOR WITH GYRUS (TURBT-GYRUS)     [name] 12-13-17  . tubes tied    . uterine ablation     about 2005    Family History  Problem Relation Age of Onset  . Hypertension Mother   . Diabetes Father   . Hypertension Brother   . HIV Brother     No Known Allergies  Current Outpatient Medications on File Prior to Visit  Medication Sig Dispense Refill  . acetaminophen (TYLENOL) 500 MG tablet Take 1,000 mg by mouth every 8 (eight) hours as needed for moderate pain.    Marland Kitchen albuterol (PROVENTIL HFA;VENTOLIN HFA) 108 (90 Base) MCG/ACT inhaler Inhale 2 puffs into the lungs every 6 (six) hours as needed for wheezing or shortness of breath. 1 Inhaler 2  . albuterol (PROVENTIL) (2.5 MG/3ML) 0.083% nebulizer solution Take 3 mLs (2.5 mg total) by nebulization every 4 (four) hours as needed for wheezing or shortness of breath. 75 mL 0  . amLODipine (NORVASC) 10 MG tablet Take 10 mg by mouth daily.     Marland Kitchen aspirin EC 81 MG tablet Take 81 mg by mouth daily.     . budesonide-formoterol (SYMBICORT) 160-4.5 MCG/ACT inhaler Inhale 2 puffs into the lungs 2 (two) times daily. 1 Inhaler 1  . ergocalciferol (VITAMIN D2) 1.25 MG (50000 UT) capsule Take 1 capsule (50,000 Units total) by mouth once a week. 4 capsule 2  . Fluticasone-Umeclidin-Vilant (TRELEGY ELLIPTA) 100-62.5-25 MCG/INH AEPB Inhale 1 puff into the lungs daily. 1 each 1  . HYDROcodone-acetaminophen (NORCO/VICODIN) 5-325 MG tablet Take 1 tablet by  mouth every 4 (four) hours as needed for moderate pain. 10 tablet 0  . Ipratropium-Albuterol (COMBIVENT) 20-100 MCG/ACT AERS respimat Inhale 1 puff into the lungs every 6 (six) hours. 1 Inhaler 1  . lidocaine-prilocaine (EMLA) cream Apply to affected area once (Patient taking differently: Apply 1 application topically as needed. To port, prior to access.) 30 g 3  . MARINOL 2.5 MG capsule TAKE 1 CAPSULE BY MOUTH TWICE  DAILY BEFORE A MEAL (Patient taking differently: Take 2.5 mg by mouth every morning. ) 60 capsule 0  . nicotine (NICODERM CQ - DOSED IN MG/24 HOURS) 14 mg/24hr patch Place 1 patch (14 mg total) onto the skin daily. (Patient taking differently: Place 14 mg onto the skin daily as needed (smoking cessation). ) 28 patch 0  . nitrofurantoin (MACRODANTIN) 100 MG capsule Take 100 mg by mouth 2 (two) times daily.    . ondansetron (ZOFRAN) 8 MG tablet Take 1 tablet (8 mg total) by mouth 2 (two) times daily as needed. Start on the third day after chemotherapy. (Patient taking differently: Take 8 mg by mouth 2 (two) times daily as needed for nausea or vomiting. Start on the third day after chemotherapy.) 30 tablet 1  . oxyCODONE (OXY IR/ROXICODONE) 5 MG immediate release tablet Take 1 tablet (5 mg total) by mouth every 6 (six) hours as needed for severe pain. 60 tablet 0  . varenicline (CHANTIX STARTING MONTH PAK) 0.5 MG X 11 & 1 MG X 42 tablet TAKE AS DIRECTED ON  PACKAGE  INSTRUCTIONS 53 tablet 0   No current facility-administered medications on file prior to visit.     BP 118/76 (BP Location: Right Arm, Patient Position: Sitting, Cuff Size: Small)   Pulse 93   Temp 99.1 F (37.3 C) (Oral)   Resp 16   Wt 73 lb (33.1 kg)   SpO2 94%   BMI 12.15 kg/m       Objective:   Physical Exam Constitutional:      Appearance: Normal appearance. She is cachectic.  Cardiovascular:     Rate and Rhythm: Normal rate and regular rhythm.  Pulmonary:     Effort: Pulmonary effort is normal.      Comments: Decreased breath sounds throughout Neurological:     Mental Status: She is alert.           Assessment & Plan:  Bladder cancer- she has upcoming follow-up scheduled with oncology next week.  Tobacco abuse-ongoing tobacco abuse urged complete cessation.  She continues Chantix which was prescribed by pulmonology.  Weight loss-her weight has stabilized.  She has good p.o. intake.  Encouraged her to keep up with good p.o. intake and to weigh daily.  She is to call us if she has any further weight loss.  Euthyroid sick syndrome-clinically stable.  Monitor.  Hypertension-blood pressure stable on amlodipine.  Continue same. COPD-clinically improved.

## 2018-10-18 ENCOUNTER — Other Ambulatory Visit (HOSPITAL_BASED_OUTPATIENT_CLINIC_OR_DEPARTMENT_OTHER): Payer: Self-pay | Admitting: Family

## 2018-10-18 DIAGNOSIS — Z1231 Encounter for screening mammogram for malignant neoplasm of breast: Secondary | ICD-10-CM

## 2018-10-19 ENCOUNTER — Ambulatory Visit (HOSPITAL_COMMUNITY)
Admission: RE | Admit: 2018-10-19 | Discharge: 2018-10-19 | Disposition: A | Discharge status: Home / Self Care | Payer: Medicare Other | Source: Ambulatory Visit | Attending: Hematology & Oncology | Admitting: Hematology & Oncology

## 2018-10-19 ENCOUNTER — Other Ambulatory Visit: Payer: Self-pay

## 2018-10-19 DIAGNOSIS — C679 Malignant neoplasm of bladder, unspecified: Secondary | ICD-10-CM | POA: Diagnosis not present

## 2018-10-19 DIAGNOSIS — Z79899 Other long term (current) drug therapy: Secondary | ICD-10-CM | POA: Insufficient documentation

## 2018-10-19 DIAGNOSIS — C670 Malignant neoplasm of trigone of bladder: Secondary | ICD-10-CM | POA: Insufficient documentation

## 2018-10-19 LAB — GLUCOSE, CAPILLARY: Glucose-Capillary: 80 mg/dL (ref 70–99)

## 2018-10-19 MED ORDER — FLUDEOXYGLUCOSE F - 18 (FDG) INJECTION
5.0000 | Freq: Once | INTRAVENOUS | Status: AC | PRN
  Administered 2018-10-19: 5 via INTRAVENOUS

## 2018-10-20 IMAGING — CT NM PET TUM IMG INITIAL (PI) SKULL BASE T - THIGH
1 of 9 series · 1 of 25 positions shown · non-contrast
Comparison: CT 12/26/2017

CLINICAL DATA: Subsequent treatment strategy for bladder carcinoma.

EXAM:
NUCLEAR MEDICINE PET SKULL BASE TO THIGH
TECHNIQUE: 5.2 mCi F-18 FDG was injected intravenously. Full-ring PET imaging
was performed from the skull base to thigh after the radiotracer. CT
data was obtained and used for attenuation correction and anatomic
localization.
Fasting blood glucose: 93 mg/dl

[Series 4: ct sk_thigh 5.0 b31f · axial · 5.0mm · 0.63mm/px · 1 of 205 slices shown]
[im 205/205  brain]
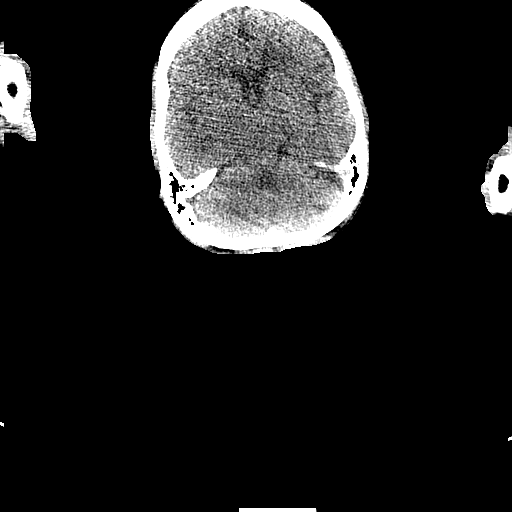

[1 of 25 positions shown; findings below may reference images not displayed]

FINDINGS: Mediastinal blood pool activity: SUV max

NECK: No hypermetabolic lymph nodes in the neck.

Incidental CT findings: none

CHEST: No hypermetabolic mediastinal lymph nodes. No suspicious
pulmonary nodules.

Incidental CT findings: Severe emphysematous change with upper lobe
predominance. Coronary artery calcification and aortic
atherosclerotic calcification.

ABDOMEN/PELVIS: No abnormal hypermetabolic activity within the
liver, pancreas, adrenal glands, or spleen. No hypermetabolic lymph
nodes in the abdomen or pelvis.

Intense FDG activity within the bladder consistent with urine
excretion. No abnormal activity within the kidneys. No
hypermetabolic pelvic lymph nodes

Incidental CT findings: RIGHT ureteral stent in place.

Extensive atherosclerotic calcification aorta iliac arteries

SKELETON: No focal hypermetabolic activity to suggest skeletal
metastasis.

Incidental CT findings: none
IMPRESSION: 1. No evidence of bladder cancer metastasis by FDG PET imaging.
2. RIGHT double-J ureteral stent in place.
3. Coronary artery calcification and Aortic Atherosclerosis
([id number]).

## 2018-10-21 ENCOUNTER — Telehealth: Payer: Self-pay | Admitting: *Deleted

## 2018-10-21 NOTE — Telephone Encounter (Signed)
Request for paradigm testing was faxed 10/19/2018 on specimen [id number] per Dr Antonieta Pert request.

## 2018-10-25 ENCOUNTER — Telehealth: Payer: Self-pay | Admitting: Hematology & Oncology

## 2018-10-25 NOTE — Telephone Encounter (Signed)
Patient called in to cancel her Lab,PF & Follow up visit due to having GI issues right now.  I was advised by Burman Nieves that she needed to contact her PCP .  We will not r/s at this time until she sees her PCP due to COVID-19.

## 2018-10-26 ENCOUNTER — Other Ambulatory Visit: Payer: Medicare Other

## 2018-10-26 ENCOUNTER — Ambulatory Visit: Payer: Medicare Other | Admitting: Hematology & Oncology

## 2018-10-29 ENCOUNTER — Encounter (HOSPITAL_COMMUNITY): Payer: Self-pay | Admitting: Hematology & Oncology

## 2018-10-29 DIAGNOSIS — C678 Malignant neoplasm of overlapping sites of bladder: Secondary | ICD-10-CM | POA: Diagnosis not present

## 2018-10-29 DIAGNOSIS — A5903 Trichomonal cystitis and urethritis: Secondary | ICD-10-CM | POA: Diagnosis not present

## 2018-10-29 DIAGNOSIS — N390 Urinary tract infection, site not specified: Secondary | ICD-10-CM | POA: Diagnosis not present

## 2018-11-01 ENCOUNTER — Ambulatory Visit: Payer: Medicare Other | Admitting: Internal Medicine

## 2018-11-02 ENCOUNTER — Telehealth: Payer: Self-pay | Admitting: Hematology & Oncology

## 2018-11-02 NOTE — Telephone Encounter (Signed)
Called and spoke with patient regarding appointments added per 4/8 sch msg

## 2018-11-05 ENCOUNTER — Inpatient Hospital Stay (HOSPITAL_BASED_OUTPATIENT_CLINIC_OR_DEPARTMENT_OTHER): Payer: Medicare Other | Admitting: Hematology & Oncology

## 2018-11-05 ENCOUNTER — Other Ambulatory Visit: Payer: Self-pay | Admitting: *Deleted

## 2018-11-05 ENCOUNTER — Other Ambulatory Visit: Payer: Self-pay

## 2018-11-05 ENCOUNTER — Encounter: Payer: Self-pay | Admitting: Hematology & Oncology

## 2018-11-05 ENCOUNTER — Inpatient Hospital Stay: Payer: Medicare Other | Attending: Hematology & Oncology

## 2018-11-05 VITALS — BP 147/88 | HR 98 | Temp 98.5°F | Resp 18 | Wt 74.0 lb

## 2018-11-05 DIAGNOSIS — Z79899 Other long term (current) drug therapy: Secondary | ICD-10-CM

## 2018-11-05 DIAGNOSIS — Z5112 Encounter for antineoplastic immunotherapy: Secondary | ICD-10-CM | POA: Diagnosis not present

## 2018-11-05 DIAGNOSIS — Z923 Personal history of irradiation: Secondary | ICD-10-CM | POA: Diagnosis not present

## 2018-11-05 DIAGNOSIS — J449 Chronic obstructive pulmonary disease, unspecified: Secondary | ICD-10-CM | POA: Diagnosis not present

## 2018-11-05 DIAGNOSIS — C670 Malignant neoplasm of trigone of bladder: Secondary | ICD-10-CM | POA: Diagnosis not present

## 2018-11-05 DIAGNOSIS — C672 Malignant neoplasm of lateral wall of bladder: Secondary | ICD-10-CM

## 2018-11-05 DIAGNOSIS — D500 Iron deficiency anemia secondary to blood loss (chronic): Secondary | ICD-10-CM | POA: Diagnosis not present

## 2018-11-05 DIAGNOSIS — E039 Hypothyroidism, unspecified: Secondary | ICD-10-CM

## 2018-11-05 LAB — CBC WITH DIFFERENTIAL (CANCER CENTER ONLY)
Abs Immature Granulocytes: 0.02 10*3/uL (ref 0.00–0.07)
Basophils Absolute: 0 10*3/uL (ref 0.0–0.1)
Basophils Relative: 0 %
Eosinophils Absolute: 0.1 10*3/uL (ref 0.0–0.5)
Eosinophils Relative: 2 %
HCT: 38.9 % (ref 36.0–46.0)
Hemoglobin: 12.1 g/dL (ref 12.0–15.0)
Immature Granulocytes: 0 %
Lymphocytes Relative: 16 %
Lymphs Abs: 0.9 10*3/uL (ref 0.7–4.0)
MCH: 29.7 pg (ref 26.0–34.0)
MCHC: 31.1 g/dL (ref 30.0–36.0)
MCV: 95.6 fL (ref 80.0–100.0)
Monocytes Absolute: 0.5 10*3/uL (ref 0.1–1.0)
Monocytes Relative: 8 %
Neutro Abs: 4.2 10*3/uL (ref 1.7–7.7)
Neutrophils Relative %: 74 %
Platelet Count: 238 10*3/uL (ref 150–400)
RBC: 4.07 MIL/uL (ref 3.87–5.11)
RDW: 14.5 % (ref 11.5–15.5)
WBC Count: 5.7 10*3/uL (ref 4.0–10.5)
nRBC: 0 % (ref 0.0–0.2)

## 2018-11-05 LAB — CMP (CANCER CENTER ONLY)
ALT: 10 U/L (ref 0–44)
AST: 13 U/L — ABNORMAL LOW (ref 15–41)
Albumin: 4.4 g/dL (ref 3.5–5.0)
Alkaline Phosphatase: 107 U/L (ref 38–126)
Anion gap: 8 (ref 5–15)
BUN: 11 mg/dL (ref 8–23)
CO2: 31 mmol/L (ref 22–32)
Calcium: 9.7 mg/dL (ref 8.9–10.3)
Chloride: 102 mmol/L (ref 98–111)
Creatinine: 0.89 mg/dL (ref 0.44–1.00)
GFR, Est AFR Am: >60 mL/min (ref >60)
GFR, Estimated: >60 mL/min (ref >60)
Glucose, Bld: 103 mg/dL — ABNORMAL HIGH (ref 70–99)
Potassium: 4 mmol/L (ref 3.5–5.1)
Sodium: 141 mmol/L (ref 135–145)
Total Bilirubin: 0.3 mg/dL (ref 0.3–1.2)
Total Protein: 7.5 g/dL (ref 6.5–8.1)

## 2018-11-05 LAB — IRON AND TIBC
Iron: 27 ug/dL — ABNORMAL LOW (ref 41–142)
Saturation Ratios: 8 % — ABNORMAL LOW (ref 21–57)
TIBC: 323 ug/dL (ref 236–444)
UIBC: 296 ug/dL (ref 120–384)

## 2018-11-05 LAB — LACTATE DEHYDROGENASE: LDH: 207 U/L — ABNORMAL HIGH (ref 98–192)

## 2018-11-05 LAB — FERRITIN: Ferritin: 18 ng/mL (ref 11–307)

## 2018-11-05 MED ORDER — LIDOCAINE-PRILOCAINE 2.5-2.5 % EX CREA: TOPICAL_CREAM | CUTANEOUS | 3 refills | Status: DC

## 2018-11-05 MED ORDER — MARINOL 2.5 MG PO CAPS: ORAL_CAPSULE | ORAL | 0 refills | Status: DC

## 2018-11-05 MED ORDER — ONDANSETRON HCL 8 MG PO TABS: 8.0000 mg | ORAL_TABLET | Freq: Two times a day (BID) | ORAL | 1 refills | Status: DC | PRN

## 2018-11-05 MED ORDER — OXYCODONE HCL 5 MG PO TABS: 5.0000 mg | ORAL_TABLET | Freq: Four times a day (QID) | ORAL | 0 refills | Status: DC | PRN

## 2018-11-05 NOTE — Progress Notes (Signed)
DISCONTINUE ON PATHWAY REGIMEN - Bladder  No Medical Intervention - Off Treatment.  REASON: Other Reason PRIOR TREATMENT: Bladder33: Observation  START ON PATHWAY REGIMEN - Bladder     A cycle is 21 days:     Pembrolizumab   **Always confirm dose/schedule in your pharmacy ordering system**  Patient Characteristics: Metastatic Disease, First Line, Prior Platinum-Based Therapy, Relapse Within 12 Months, FGFR2/FGFR3 Mutation Negative Unknown, Candidate for PD-1/PD-L1 Inhibitor Therapeutic Status: Metastatic Disease Line of Therapy: First Line Prior Platinum-Based Therapy<= Yes Time to Relapse: Relapse Within 12 Months FGFR2/FGFR3 Mutation Status: Negative PD-1/PD-L1 Inhibitor Candidate Status: Candidate for PD-1/PD-L1 Inhibitor Intent of Therapy: Non-Curative / Palliative Intent, Discussed with Patient

## 2018-11-05 NOTE — Progress Notes (Signed)
Hematology and Oncology Follow Up Visit  Donna Curry [id number] 08-Oct-1955 63 y.o. 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 21 days --cycle #1 on 11/14/2018  IV iron as indicated     Interim History:  Donna Curry is back for her follow-up.  She finally had her repeat cystoscopy.  This was done on 10/03/2018.  [name] did his best to try to resect out what he could.  He did get some specimen for [place].  The pathology report ([id number]) shows an infiltrating high-grade urothelial carcinoma.  Thankfully, we were able to get molecular studies on this.  She has a very high TMB.  This I think will be very helpful for [place].  She was FGFR2 wild-type.  We did do a PET scan on her.  This was done on 10/19/2018.  The PET scan did show a right bladder wall mass measuring 3.8 x 2.6 cm.  Again, this was done after she had her cystoscopy.  There was also uptake in what was thought to be a iliac node on the left.  She is clearly not a candidate for chemotherapy.  However, because of the high TMB, I think that we will be able to utilize immunotherapy on her.  Her weight is still down.  This will always be an issue.  She stopped smoking 4 weeks ago.  She is not been hospitalized with emphysema.  She has had no problems with pain outside of that on the right side.  I am unsure if this is the bladder mass or not.  She has had no bleeding.  She has had no nausea or vomiting.  She has had no fever.  There is been no cough.  Overall, her performance status is ECOG 2-3.     Medications:  Current Outpatient Medications:  .  acetaminophen (TYLENOL) 500 MG tablet, Take 1,000 mg by mouth every 8 (eight) hours as needed for moderate pain., Disp: , Rfl:  .  albuterol  (PROVENTIL HFA;VENTOLIN HFA) 108 (90 Base) MCG/ACT inhaler, Inhale 2 puffs into the lungs every 6 (six) hours as needed for wheezing or shortness of breath., Disp: 1 Inhaler, Rfl: 2 .  albuterol (PROVENTIL) (2.5 MG/3ML) 0.083% nebulizer solution, Take 3 mLs (2.5 mg total) by nebulization every 4 (four) hours as needed for wheezing or shortness of breath., Disp: 75 mL, Rfl: 0 .  amLODipine (NORVASC) 10 MG tablet, Take 10 mg by mouth daily. , Disp: , Rfl:  .  aspirin EC 81 MG tablet, Take 81 mg by mouth daily. , Disp: , Rfl:  .  budesonide-formoterol (SYMBICORT) 160-4.5 MCG/ACT inhaler, Inhale 2 puffs into the lungs 2 (two) times daily., Disp: 1 Inhaler, Rfl: 1 .  ergocalciferol (VITAMIN D2) 1.25 MG (50000 UT) capsule, Take 1 capsule (50,000 Units total) by mouth once a week., Disp: 4 capsule, Rfl: 2 .  Fluticasone-Umeclidin-Vilant (TRELEGY ELLIPTA) 100-62.5-25 MCG/INH AEPB, Inhale 1 puff into the lungs daily., Disp: 1 each, Rfl: 1 .  Ipratropium-Albuterol (COMBIVENT) 20-100 MCG/ACT AERS respimat, Inhale 1 puff into the lungs every 6 (six) hours., Disp: 1 Inhaler, Rfl: 1 .  MARINOL 2.5 MG capsule, TAKE 1 CAPSULE BY MOUTH TWICE DAILY BEFORE A MEAL, Disp: 60 capsule, Rfl: 0 .  nicotine (NICODERM CQ - DOSED IN  MG/24 HOURS) 14 mg/24hr patch, Place 1 patch (14 mg total) onto the skin daily. (Patient taking differently: Place 14 mg onto the skin daily as needed (smoking cessation). ), Disp: 28 patch, Rfl: 0 .  nitrofurantoin (MACRODANTIN) 100 MG capsule, Take 100 mg by mouth 2 (two) times daily., Disp: , Rfl:  .  oxyCODONE (OXY IR/ROXICODONE) 5 MG immediate release tablet, Take 1 tablet (5 mg total) by mouth every 6 (six) hours as needed for severe pain., Disp: 60 tablet, Rfl: 0 .  varenicline (CHANTIX STARTING MONTH PAK) 0.5 MG X 11 & 1 MG X 42 tablet, TAKE AS DIRECTED ON  PACKAGE  INSTRUCTIONS, Disp: 53 tablet, Rfl: 0  Allergies: No Known Allergies  Past Medical History, Surgical history, Social history,  and Family History were reviewed and updated.  Review of Systems: Review of Systems  Constitutional: Negative.   HENT:  Negative.   Eyes: Negative.   Respiratory: Negative.   Cardiovascular: Negative.   Gastrointestinal: Negative.   Endocrine: Negative.   Genitourinary: Negative.    Musculoskeletal: Negative.   Skin: Negative.   Neurological: Negative.   Hematological: Negative.   Psychiatric/Behavioral: Negative.     Physical Exam:  weight is 74 lb (33.6 kg). Her oral temperature is 98.5 F (36.9 C). Her blood pressure is 147/88 (abnormal) and her pulse is 98. Her respiration is 18 and oxygen saturation is 97%.   Wt Readings from Last 3 Encounters:  11/05/18 74 lb (33.6 kg)  10/12/18 73 lb (33.1 kg)  10/03/18 73 lb (33.1 kg)    Physical Exam Vitals signs reviewed.  HENT:     Head: Normocephalic and atraumatic.  Eyes:     Pupils: Pupils are equal, round, and reactive to light.  Neck:     Musculoskeletal: Normal range of motion.  Cardiovascular:     Rate and Rhythm: Normal rate and regular rhythm.     Heart sounds: Normal heart sounds.  Pulmonary:     Effort: Pulmonary effort is normal.     Breath sounds: Normal breath sounds.  Abdominal:     General: Bowel sounds are normal.     Palpations: Abdomen is soft.     Comments: Her abdominal exam is soft.  She has decent bowel sounds.  There might be some fullness in the hypoumbilical area.  She has no fluid wave.  There is no palpable liver or spleen tip.  Musculoskeletal: Normal range of motion.        General: No tenderness or deformity.  Lymphadenopathy:     Cervical: No cervical adenopathy.  Skin:    General: Skin is warm and dry.     Findings: No erythema or rash.  Neurological:     Mental Status: She is alert and oriented to person, place, and time.  Psychiatric:        Behavior: Behavior normal.        Thought Content: Thought content normal.        Judgment: Judgment normal.      Lab Results   Component Value Date   WBC 5.7 11/05/2018   HGB 12.1 11/05/2018   HCT 38.9 11/05/2018   MCV 95.6 11/05/2018   PLT 238 11/05/2018     Chemistry      Component Value Date/Time   NA 141 11/05/2018 0932   K 4.0 11/05/2018 0932   CL 102 11/05/2018 0932   CO2 31 11/05/2018 0932   BUN 11 11/05/2018 0932   CREATININE 0.89 11/05/2018 0932  CREATININE 1.10 08/20/2013 1027      Component Value Date/Time   CALCIUM 9.7 11/05/2018 0932   ALKPHOS 107 11/05/2018 0932   AST 13 (L) 11/05/2018 0932   ALT 10 11/05/2018 0932   BILITOT 0.3 11/05/2018 0932         Impression and Plan: Donna Curry is a 63 year old African-American female.  She has 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  Again, I think immunotherapy will be her best chance for her right now.  I do know that there has been some new approvals for bladder cancer that we might consider down the road.  I talked to her about immunotherapy.  I think we will have to be careful with respect to pulmonary issues.  She does have significant COPD.  For right now, I will go ahead and plan to start her treatments on 11/14/2018.  I will then see her back for cycle #2 of treatment in early May.  I probably would try 4 cycles of treatment and then repeat her scans to see if she is responding.  I spent about 40 minutes with her today.  All time spent face-to-face with her.  100% of time was spent counseling her about the immunotherapy and going over her results.  I answered her questions.       [name], MD. 4/13/202011:25 AM

## 2018-11-06 ENCOUNTER — Other Ambulatory Visit: Payer: Self-pay | Admitting: *Deleted

## 2018-11-06 DIAGNOSIS — C672 Malignant neoplasm of lateral wall of bladder: Secondary | ICD-10-CM

## 2018-11-06 MED ORDER — MARINOL 2.5 MG PO CAPS: ORAL_CAPSULE | ORAL | 0 refills | Status: DC

## 2018-11-06 MED ORDER — LIDOCAINE-PRILOCAINE 2.5-2.5 % EX CREA: TOPICAL_CREAM | CUTANEOUS | 3 refills | Status: DC

## 2018-11-13 ENCOUNTER — Other Ambulatory Visit: Payer: Self-pay | Admitting: *Deleted

## 2018-11-13 DIAGNOSIS — C670 Malignant neoplasm of trigone of bladder: Secondary | ICD-10-CM

## 2018-11-14 ENCOUNTER — Other Ambulatory Visit: Payer: Self-pay

## 2018-11-14 ENCOUNTER — Inpatient Hospital Stay: Payer: Medicare Other

## 2018-11-14 ENCOUNTER — Other Ambulatory Visit: Payer: Self-pay | Admitting: Hematology & Oncology

## 2018-11-14 VITALS — BP 156/80 | HR 93 | Temp 98.8°F | Resp 18

## 2018-11-14 DIAGNOSIS — C670 Malignant neoplasm of trigone of bladder: Secondary | ICD-10-CM

## 2018-11-14 DIAGNOSIS — D500 Iron deficiency anemia secondary to blood loss (chronic): Secondary | ICD-10-CM | POA: Diagnosis not present

## 2018-11-14 DIAGNOSIS — C672 Malignant neoplasm of lateral wall of bladder: Secondary | ICD-10-CM

## 2018-11-14 DIAGNOSIS — J449 Chronic obstructive pulmonary disease, unspecified: Secondary | ICD-10-CM | POA: Diagnosis not present

## 2018-11-14 DIAGNOSIS — Z5112 Encounter for antineoplastic immunotherapy: Secondary | ICD-10-CM | POA: Diagnosis not present

## 2018-11-14 LAB — CMP (CANCER CENTER ONLY)
ALT: 6 U/L (ref 0–44)
AST: 11 U/L — ABNORMAL LOW (ref 15–41)
Albumin: 4.1 g/dL (ref 3.5–5.0)
Alkaline Phosphatase: 100 U/L (ref 38–126)
Anion gap: 8 (ref 5–15)
BUN: 9 mg/dL (ref 8–23)
CO2: 29 mmol/L (ref 22–32)
Calcium: 9.7 mg/dL (ref 8.9–10.3)
Chloride: 102 mmol/L (ref 98–111)
Creatinine: 0.83 mg/dL (ref 0.44–1.00)
GFR, Est AFR Am: >60 mL/min (ref >60)
GFR, Estimated: >60 mL/min (ref >60)
Glucose, Bld: 93 mg/dL (ref 70–99)
Potassium: 3.9 mmol/L (ref 3.5–5.1)
Sodium: 139 mmol/L (ref 135–145)
Total Bilirubin: 0.3 mg/dL (ref 0.3–1.2)
Total Protein: 7.1 g/dL (ref 6.5–8.1)

## 2018-11-14 LAB — CBC WITH DIFFERENTIAL (CANCER CENTER ONLY)
Abs Immature Granulocytes: 0.01 10*3/uL (ref 0.00–0.07)
Basophils Absolute: 0 10*3/uL (ref 0.0–0.1)
Basophils Relative: 0 %
Eosinophils Absolute: 0.1 10*3/uL (ref 0.0–0.5)
Eosinophils Relative: 1 %
HCT: 36.3 % (ref 36.0–46.0)
Hemoglobin: 11.6 g/dL — ABNORMAL LOW (ref 12.0–15.0)
Immature Granulocytes: 0 %
Lymphocytes Relative: 22 %
Lymphs Abs: 1.2 10*3/uL (ref 0.7–4.0)
MCH: 29.7 pg (ref 26.0–34.0)
MCHC: 32 g/dL (ref 30.0–36.0)
MCV: 92.8 fL (ref 80.0–100.0)
Monocytes Absolute: 0.5 10*3/uL (ref 0.1–1.0)
Monocytes Relative: 9 %
Neutro Abs: 3.9 10*3/uL (ref 1.7–7.7)
Neutrophils Relative %: 68 %
Platelet Count: 250 10*3/uL (ref 150–400)
RBC: 3.91 MIL/uL (ref 3.87–5.11)
RDW: 14.6 % (ref 11.5–15.5)
WBC Count: 5.7 10*3/uL (ref 4.0–10.5)
nRBC: 0 % (ref 0.0–0.2)

## 2018-11-14 MED ORDER — SODIUM CHLORIDE 0.9 % IV SOLN
Freq: Once | INTRAVENOUS | Status: AC
  Administered 2018-11-14: 12:00:00 via INTRAVENOUS
  Filled 2018-11-14: qty 250

## 2018-11-14 MED ORDER — SODIUM CHLORIDE 0.9 % IV SOLN
200.0000 mg | Freq: Once | INTRAVENOUS | Status: AC
  Administered 2018-11-14: 12:00:00 200 mg via INTRAVENOUS
  Filled 2018-11-14: qty 8

## 2018-11-14 MED ORDER — HEPARIN SOD (PORK) LOCK FLUSH 100 UNIT/ML IV SOLN
500.0000 [IU] | Freq: Once | INTRAVENOUS | Status: AC | PRN
  Administered 2018-11-14: 13:00:00 500 [IU]
  Filled 2018-11-14: qty 5

## 2018-11-14 MED ORDER — SODIUM CHLORIDE 0.9% FLUSH
10.0000 mL | INTRAVENOUS | Status: DC | PRN
  Administered 2018-11-14: 10 mL
  Filled 2018-11-14: qty 10

## 2018-11-14 NOTE — Progress Notes (Signed)
OK to treat with labs from 11/05/18 per [name].

## 2018-12-03 ENCOUNTER — Encounter: Payer: Self-pay | Admitting: Internal Medicine

## 2018-12-03 ENCOUNTER — Ambulatory Visit (INDEPENDENT_AMBULATORY_CARE_PROVIDER_SITE_OTHER): Payer: Medicare Other | Admitting: Internal Medicine

## 2018-12-03 ENCOUNTER — Other Ambulatory Visit: Payer: Self-pay

## 2018-12-03 DIAGNOSIS — E0590 Thyrotoxicosis, unspecified without thyrotoxic crisis or storm: Secondary | ICD-10-CM

## 2018-12-03 NOTE — Progress Notes (Signed)
Virtual Visit via Telephone Note  I connected with Donna Curry on 12/03/18 at 10:50 AM EDT by telephone and verified that I am speaking with the correct person using two identifiers.   I discussed the limitations, risks, security and privacy concerns of performing an evaluation and management service by telephone and the availability of in person appointments. I also discussed with the patient that there may be a patient responsible charge related to this service. The patient expressed understanding and agreed to proceed.  -Location of the patient : Home -Location of the provider : Office -The names of all persons participating in the telemedicine service : Pt and myself      Name: Donna Curry  MRN/ DOB: [id number], 1955/10/12    Age/ Sex: 62 y.o., female     PCP: Donna Alar, NP   Reason for Endocrinology Evaluation: Subclinical hyperthyroidsm     Initial Endocrinology Clinic Visit: 09/23/2018    PATIENT IDENTIFIER: Donna Curry is a 63 y.o., female with a past medical history of COPD and bladder cancer . She has followed with [facility] since 09/23/2018 for consultative assistance with management of her subclinical hyerthyroidism.   HISTORICAL SUMMARY: The patient was first diagnosed with subclinical hyperthyroidism with a TSH of 0.125 uIU/mL with normal T4 and T3 during hospitalization for pneumonia in 08/2018.  She was having difficulty gaining weight at the time but did not have any other hyperthyroid symptoms.       SUBJECTIVE:   During last visit (09/23/2018): TSH normalized at 0.36 uIU/mL   Today (12/03/2018):  Donna Curry is here for a 6 week telephone visit follow up on subclinical hyperthyroidism. Since her last visit with me she has been started on immunotherapy for urothelial carcinoma.   She feels well . She believes she has gained 2 lbs since her last visit here. She denies any fatigue, anxiety, jittery sensation or  palpitations.   She denies any local neck symptoms.    ROS:  As per HPI.   HISTORY:  Past Medical History:  Past Medical History:  Diagnosis Date  . Anemia   . Asthma   . Dyspnea    with exertion   . Family history of adverse reaction to anesthesia    brother wakes up and dont know who he is and sister has n/v  . Goals of care, counseling/discussion 02/09/2018  . History of blood transfusion   . Hypertension   . Iron deficiency anemia due to chronic blood loss 03/01/2018  . Measles as a child  . Mumps as a child  . TIA (transient ischemic attack)   . Tobacco abuse     Past Surgical History:  Past Surgical History:  Procedure Laterality Date  . CYSTOSCOPY W/ URETERAL STENT PLACEMENT Right 12/13/2017   Procedure: CYSTOSCOPY WITH RIGHT RETROGRADE PYELOGRAM/URETERAL RIGHT STENT PLACEMENT;  Surgeon: Donna Mallow, MD;  Location: [facility];  Service: Urology;  Laterality: Right;  . IR IMAGING GUIDED PORT INSERTION  02/23/2018  . TRANSURETHRAL RESECTION OF BLADDER TUMOR N/A 12/13/2017   Procedure: TRANSURETHRAL RESECTION OF BLADDER TUMOR (TURBT);  Surgeon: Donna Mallow, MD;  Location: [facility];  Service: Urology;  Laterality: N/A;  . TRANSURETHRAL RESECTION OF BLADDER TUMOR N/A 10/03/2018   Procedure: TRANSURETHRAL RESECTION OF BLADDER TUMOR (TURBT);  Surgeon: Donna Mallow, MD;  Location: [facility];  Service: Urology;  Laterality: N/A;  . TRANSURETHRAL RESECTION OF BLADDER TUMOR WITH GYRUS (TURBT-GYRUS)  Dr. Gloriann Curry 12-13-17  . tubes tied    . uterine ablation     about 2005     Social History:  reports that she has been smoking cigarettes. She has been smoking about 0.25 packs per day. She has never used smokeless tobacco. She reports that she does not drink alcohol or use drugs. Family History:  Family History  Problem Relation Age of Onset  . Hypertension Mother   . Diabetes Father   . Hypertension Brother   . HIV Brother       HOME MEDICATIONS: Allergies as of  12/03/2018   No Known Allergies     Medication List       Accurate as of Dec 03, 2018 10:57 AM. If you have any questions, ask your nurse or doctor.        acetaminophen 500 MG tablet Commonly known as:  TYLENOL Take 1,000 mg by mouth every 8 (eight) hours as needed for moderate pain.   albuterol 108 (90 Base) MCG/ACT inhaler Commonly known as:  VENTOLIN HFA Inhale 2 puffs into the lungs every 6 (six) hours as needed for wheezing or shortness of breath.   albuterol (2.5 MG/3ML) 0.083% nebulizer solution Commonly known as:  PROVENTIL Take 3 mLs (2.5 mg total) by nebulization every 4 (four) hours as needed for wheezing or shortness of breath.   amLODipine 10 MG tablet Commonly known as:  NORVASC Take 10 mg by mouth daily.   aspirin EC 81 MG tablet Take 81 mg by mouth daily.   budesonide-formoterol 160-4.5 MCG/ACT inhaler Commonly known as:  Symbicort Inhale 2 puffs into the lungs 2 (two) times daily.   ergocalciferol 1.25 MG (50000 UT) capsule Commonly known as:  VITAMIN D2 Take 1 capsule (50,000 Units total) by mouth once a week.   Fluticasone-Umeclidin-Vilant 100-62.5-25 MCG/INH Aepb Commonly known as:  Trelegy Ellipta Inhale 1 puff into the lungs daily.   Ipratropium-Albuterol 20-100 MCG/ACT Aers respimat Commonly known as:  COMBIVENT Inhale 1 puff into the lungs every 6 (six) hours.   lidocaine-prilocaine cream Commonly known as:  EMLA Apply to affected area once   Marinol 2.5 MG capsule Generic drug:  dronabinol TAKE 1 CAPSULE BY MOUTH TWICE DAILY BEFORE A MEAL   nicotine 14 mg/24hr patch Commonly known as:  NICODERM CQ - dosed in mg/24 hours Place 1 patch (14 mg total) onto the skin daily. What changed:    when to take this  reasons to take this   nitrofurantoin 100 MG capsule Commonly known as:  MACRODANTIN Take 100 mg by mouth 2 (two) times daily.   oxyCODONE 5 MG immediate release tablet Commonly known as:  Oxy IR/ROXICODONE Take 1 tablet (5  mg total) by mouth every 6 (six) hours as needed for severe pain.   varenicline 0.5 MG X 11 & 1 MG X 42 tablet Commonly known as:  Chantix Starting Month Pak TAKE AS DIRECTED ON  PACKAGE  INSTRUCTIONS          DATA REVIEWED:  Results for Donna Curry (MRN [id number]) as of 12/03/2018 12:02  Ref. Range 08/31/2018 22:11 09/01/2018 08:08 09/21/2018 16:09  TSH Latest Ref Range: 0.35 - 4.50 uIU/mL 0.125 (L)  0.36  Triiodothyronine,Free,Serum Latest Ref Range: 2.0 - 4.4 pg/mL  2.0   Triiodothyronine (T3) Latest Ref Range: 76 - 181 ng/dL   107  T4,Free(Direct) Latest Ref Range: 0.60 - 1.60 ng/dL  0.94 0.99     ASSESSMENT / PLAN / RECOMMENDATIONS:   1. Subclinical  hyperthyroidism :  - Clinically she is euthyroid - Repeat TFT's in 08/2018 shows normalization of the TSH , will repeat again to confirm complete resolution - D/D could be euthyroid sick syndrome vs a form of thyroiditis.    I discussed the assessment and treatment plan with the patient. The patient was provided an opportunity to ask questions and all were answered. The patient agreed with the plan and demonstrated an understanding of the instructions.   The patient was advised to call back or seek an in-person evaluation if the symptoms worsen or if the condition fails to improve as anticipated.  I provided 5 minutes of non-face-to-face time during this encounter.     F/U PRN   Signed electronically by: Mack Guise, MD  [facility]  [facility] [address] [place], [place] [address] Phone: [phone] FAX: [phone]   CC: Donna Curry, [address] [place] [address] Phone: [phone]  Fax: [phone]  Return to Endocrinology clinic as below: Future Appointments  Date Time Provider [place]  12/05/2018  1:00 PM [facility] None  12/05/2018  1:15 PM [name], MD [facility] None  12/05/2018  1:45 PM  [facility] B1 [facility] None  01/14/2019 11:00 AM Donna Alar, NP [facility]

## 2018-12-05 ENCOUNTER — Other Ambulatory Visit: Payer: Self-pay

## 2018-12-05 ENCOUNTER — Other Ambulatory Visit: Payer: Self-pay | Admitting: Pulmonary Disease

## 2018-12-05 ENCOUNTER — Inpatient Hospital Stay: Payer: Medicare Other

## 2018-12-05 ENCOUNTER — Inpatient Hospital Stay: Payer: Medicare Other | Attending: Hematology & Oncology | Admitting: Hematology & Oncology

## 2018-12-05 ENCOUNTER — Other Ambulatory Visit: Payer: Medicare Other

## 2018-12-05 ENCOUNTER — Encounter: Payer: Self-pay | Admitting: Hematology & Oncology

## 2018-12-05 ENCOUNTER — Other Ambulatory Visit: Payer: Self-pay | Admitting: Medical

## 2018-12-05 VITALS — HR 86

## 2018-12-05 DIAGNOSIS — C673 Malignant neoplasm of anterior wall of bladder: Secondary | ICD-10-CM | POA: Insufficient documentation

## 2018-12-05 DIAGNOSIS — Z923 Personal history of irradiation: Secondary | ICD-10-CM | POA: Diagnosis not present

## 2018-12-05 DIAGNOSIS — C672 Malignant neoplasm of lateral wall of bladder: Secondary | ICD-10-CM

## 2018-12-05 DIAGNOSIS — C670 Malignant neoplasm of trigone of bladder: Secondary | ICD-10-CM

## 2018-12-05 DIAGNOSIS — Z79899 Other long term (current) drug therapy: Secondary | ICD-10-CM | POA: Insufficient documentation

## 2018-12-05 DIAGNOSIS — D509 Iron deficiency anemia, unspecified: Secondary | ICD-10-CM | POA: Insufficient documentation

## 2018-12-05 DIAGNOSIS — Z5112 Encounter for antineoplastic immunotherapy: Secondary | ICD-10-CM | POA: Diagnosis present

## 2018-12-05 DIAGNOSIS — E039 Hypothyroidism, unspecified: Secondary | ICD-10-CM

## 2018-12-05 LAB — CBC WITH DIFFERENTIAL (CANCER CENTER ONLY)
Abs Immature Granulocytes: 0.01 10*3/uL (ref 0.00–0.07)
Basophils Absolute: 0 10*3/uL (ref 0.0–0.1)
Basophils Relative: 1 %
Eosinophils Absolute: 0 10*3/uL (ref 0.0–0.5)
Eosinophils Relative: 1 %
HCT: 35.1 % — ABNORMAL LOW (ref 36.0–46.0)
Hemoglobin: 11.1 g/dL — ABNORMAL LOW (ref 12.0–15.0)
Immature Granulocytes: 0 %
Lymphocytes Relative: 25 %
Lymphs Abs: 1 10*3/uL (ref 0.7–4.0)
MCH: 28.7 pg (ref 26.0–34.0)
MCHC: 31.6 g/dL (ref 30.0–36.0)
MCV: 90.7 fL (ref 80.0–100.0)
Monocytes Absolute: 0.5 10*3/uL (ref 0.1–1.0)
Monocytes Relative: 13 %
Neutro Abs: 2.3 10*3/uL (ref 1.7–7.7)
Neutrophils Relative %: 60 %
Platelet Count: 227 10*3/uL (ref 150–400)
RBC: 3.87 MIL/uL (ref 3.87–5.11)
RDW: 14.5 % (ref 11.5–15.5)
WBC Count: 3.8 10*3/uL — ABNORMAL LOW (ref 4.0–10.5)
nRBC: 0 % (ref 0.0–0.2)

## 2018-12-05 LAB — CMP (CANCER CENTER ONLY)
ALT: 11 U/L (ref 0–44)
AST: 13 U/L — ABNORMAL LOW (ref 15–41)
Albumin: 3.9 g/dL (ref 3.5–5.0)
Alkaline Phosphatase: 91 U/L (ref 38–126)
Anion gap: 8 (ref 5–15)
BUN: 15 mg/dL (ref 8–23)
CO2: 28 mmol/L (ref 22–32)
Calcium: 9.5 mg/dL (ref 8.9–10.3)
Chloride: 104 mmol/L (ref 98–111)
Creatinine: 1 mg/dL (ref 0.44–1.00)
GFR, Est AFR Am: >60 mL/min (ref >60)
GFR, Estimated: >60 mL/min (ref >60)
Glucose, Bld: 110 mg/dL — ABNORMAL HIGH (ref 70–99)
Potassium: 3.3 mmol/L — ABNORMAL LOW (ref 3.5–5.1)
Sodium: 140 mmol/L (ref 135–145)
Total Bilirubin: 0.2 mg/dL — ABNORMAL LOW (ref 0.3–1.2)
Total Protein: 7 g/dL (ref 6.5–8.1)

## 2018-12-05 MED ORDER — OXYCODONE HCL 5 MG PO TABS: 5.0000 mg | ORAL_TABLET | Freq: Four times a day (QID) | ORAL | 0 refills | Status: DC | PRN

## 2018-12-05 MED ORDER — LIDOCAINE-PRILOCAINE 2.5-2.5 % EX CREA: 1.0000 "application " | TOPICAL_CREAM | CUTANEOUS | 0 refills | Status: DC | PRN

## 2018-12-05 MED ORDER — SODIUM CHLORIDE 0.9% FLUSH
10.0000 mL | INTRAVENOUS | Status: DC | PRN
  Administered 2018-12-05: 10 mL
  Filled 2018-12-05: qty 10

## 2018-12-05 MED ORDER — SODIUM CHLORIDE 0.9 % IV SOLN
200.0000 mg | Freq: Once | INTRAVENOUS | Status: AC
  Administered 2018-12-05: 200 mg via INTRAVENOUS
  Filled 2018-12-05: qty 8

## 2018-12-05 MED ORDER — MARINOL 2.5 MG PO CAPS: ORAL_CAPSULE | ORAL | 0 refills | Status: DC

## 2018-12-05 MED ORDER — SODIUM CHLORIDE 0.9 % IV SOLN
Freq: Once | INTRAVENOUS | Status: AC
  Administered 2018-12-05: 15:00:00 via INTRAVENOUS
  Filled 2018-12-05: qty 250

## 2018-12-05 MED ORDER — HEPARIN SOD (PORK) LOCK FLUSH 100 UNIT/ML IV SOLN
500.0000 [IU] | Freq: Once | INTRAVENOUS | Status: AC | PRN
  Administered 2018-12-05: 500 [IU]
  Filled 2018-12-05: qty 5

## 2018-12-05 NOTE — Progress Notes (Signed)
Hematology and Oncology Follow Up Visit  Donna Curry [id number] Nov 24, 1955 63 y.o. 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 21 days --s/p cycle #1-- start on 11/14/2018  IV iron as indicated     Interim History:  Donna Curry is back for her follow-up.  She finally had her repeat cystoscopy.  This was done on 10/03/2018.  [name] did his best to try to resect out what he could.  He did get some specimen for [place].  The pathology report ([id number]) shows an infiltrating high-grade urothelial carcinoma.  Thankfully, we were able to get molecular studies on this.  She has a very high TMB.  This I think will be very helpful for [place].  She was FGFR2 wild-type.  She is doing quite well.  She has had no issues with the pembrolizumab.  There is been no diarrhea.  She is not having any pain issues.  She is urinating well.  Her appetite is doing well.  Donna Curry is helping.  Her weight is holding steady.  She is now 5 weeks out from smoking.  This I think is her greatest accomplishment so far.  There is no fever.  She has had no cough.  She has had no bleeding.  Overall, her performance status is ECOG 2.     Medications:  Current Outpatient Medications:  .  acetaminophen (TYLENOL) 500 MG tablet, Take 1,000 mg by mouth every 8 (eight) hours as needed for moderate pain., Disp: , Rfl:  .  albuterol (PROVENTIL HFA;VENTOLIN HFA) 108 (90 Base) MCG/ACT inhaler, Inhale 2 puffs into the lungs every 6 (six) hours as needed for wheezing or shortness of breath., Disp: 1 Inhaler, Rfl: 2 .  albuterol (PROVENTIL) (2.5 MG/3ML) 0.083% nebulizer solution, Take 3 mLs (2.5 mg total) by nebulization every 4 (four) hours as needed for wheezing or shortness of breath.,  Disp: 75 mL, Rfl: 0 .  amLODipine (NORVASC) 10 MG tablet, Take 10 mg by mouth daily. , Disp: , Rfl:  .  aspirin EC 81 MG tablet, Take 81 mg by mouth daily. , Disp: , Rfl:  .  budesonide-formoterol (SYMBICORT) 160-4.5 MCG/ACT inhaler, Inhale 2 puffs into the lungs 2 (two) times daily., Disp: 1 Inhaler, Rfl: 1 .  ergocalciferol (VITAMIN D2) 1.25 MG (50000 UT) capsule, Take 1 capsule (50,000 Units total) by mouth once a week., Disp: 4 capsule, Rfl: 2 .  Fluticasone-Umeclidin-Vilant (TRELEGY ELLIPTA) 100-62.5-25 MCG/INH AEPB, Inhale 1 puff into the lungs daily., Disp: 1 each, Rfl: 1 .  Ipratropium-Albuterol (COMBIVENT) 20-100 MCG/ACT AERS respimat, Inhale 1 puff into the lungs every 6 (six) hours., Disp: 1 Inhaler, Rfl: 1 .  lidocaine-prilocaine (EMLA) cream, Apply to affected area once, Disp: 30 g, Rfl: 3 .  Donna Curry 2.5 MG capsule, TAKE 1 CAPSULE BY MOUTH TWICE DAILY BEFORE A MEAL, Disp: 60 capsule, Rfl: 0 .  nicotine (NICODERM CQ - DOSED IN MG/24 HOURS) 14 mg/24hr patch, Place 1 patch (14 mg total) onto the skin daily. (Patient taking differently: Place 14 mg onto the skin daily as needed (smoking cessation). ), Disp: 28 patch, Rfl: 0 .  nitrofurantoin (MACRODANTIN) 100 MG capsule, Take 100 mg by mouth 2 (two) times daily., Disp: , Rfl:  .  oxyCODONE (OXY IR/ROXICODONE) 5 MG immediate release  tablet, Take 1 tablet (5 mg total) by mouth every 6 (six) hours as needed for severe pain., Disp: 60 tablet, Rfl: 0 .  varenicline (CHANTIX STARTING MONTH PAK) 0.5 MG X 11 & 1 MG X 42 tablet, TAKE AS DIRECTED ON  PACKAGE  INSTRUCTIONS, Disp: 53 tablet, Rfl: 0  Allergies: No Known Allergies  Past Medical History, Surgical history, Social history, and Family History were reviewed and updated.  Review of Systems: Review of Systems  Constitutional: Negative.   HENT:  Negative.   Eyes: Negative.   Respiratory: Negative.   Cardiovascular: Negative.   Gastrointestinal: Negative.   Endocrine: Negative.    Genitourinary: Negative.    Musculoskeletal: Negative.   Skin: Negative.   Neurological: Negative.   Hematological: Negative.   Psychiatric/Behavioral: Negative.     Physical Exam:  weight is 73 lb (33.1 kg). Her oral temperature is 98.7 F (37.1 C). Her blood pressure is 147/79 (abnormal) and her pulse is 102 (abnormal). Her oxygen saturation is 100%.   Wt Readings from Last 3 Encounters:  12/05/18 73 lb (33.1 kg)  11/05/18 74 lb (33.6 kg)  10/12/18 73 lb (33.1 kg)    Physical Exam Vitals signs reviewed.  HENT:     Head: Normocephalic and atraumatic.  Eyes:     Pupils: Pupils are equal, round, and reactive to light.  Neck:     Musculoskeletal: Normal range of motion.  Cardiovascular:     Rate and Rhythm: Normal rate and regular rhythm.     Heart sounds: Normal heart sounds.  Pulmonary:     Effort: Pulmonary effort is normal.     Breath sounds: Normal breath sounds.  Abdominal:     General: Bowel sounds are normal.     Palpations: Abdomen is soft.     Comments: Her abdominal exam is soft.  She has decent bowel sounds.  There might be some fullness in the hypoumbilical area.  She has no fluid wave.  There is no palpable liver or spleen tip.  Musculoskeletal: Normal range of motion.        General: No tenderness or deformity.  Lymphadenopathy:     Cervical: No cervical adenopathy.  Skin:    General: Skin is warm and dry.     Findings: No erythema or rash.  Neurological:     Mental Status: She is alert and oriented to person, place, and time.  Psychiatric:        Behavior: Behavior normal.        Thought Content: Thought content normal.        Judgment: Judgment normal.      Lab Results  Component Value Date   WBC 3.8 (L) 12/05/2018   HGB 11.1 (L) 12/05/2018   HCT 35.1 (L) 12/05/2018   MCV 90.7 12/05/2018   PLT 227 12/05/2018     Chemistry      Component Value Date/Time   NA 140 12/05/2018 1330   K 3.3 (L) 12/05/2018 1330   CL 104 12/05/2018 1330   CO2  28 12/05/2018 1330   BUN 15 12/05/2018 1330   CREATININE 1.00 12/05/2018 1330   CREATININE 1.10 08/20/2013 1027      Component Value Date/Time   CALCIUM 9.5 12/05/2018 1330   ALKPHOS 91 12/05/2018 1330   AST 13 (L) 12/05/2018 1330   ALT 11 12/05/2018 1330   BILITOT 0.2 (L) 12/05/2018 1330         Impression and Plan: Ms. Keeran is a 63 year old African-American female.  She has 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  We will go ahead with her second cycle of pembrolizumab.  I am just very happy for her.  Excited that she is holding her own.  Hopefully, her weight come up.  We will get her back in another 3 weeks.  After 4 weeks, I would repeat her PET scan.  [name], MD. 5/13/20202:29 PM

## 2018-12-05 NOTE — Patient Instructions (Signed)
Pembrolizumab injection  What is this medicine?  PEMBROLIZUMAB (pem broe liz ue mab) is a monoclonal antibody. It is used to treat cervical cancer, esophageal cancer, head and neck cancer, hepatocellular cancer, Hodgkin lymphoma, kidney cancer, lymphoma, melanoma, Merkel cell carcinoma, lung cancer, stomach cancer, urothelial cancer, and cancers that have a certain genetic condition.  This medicine may be used for other purposes; ask your health care provider or pharmacist if you have questions.  COMMON BRAND NAME(S): Keytruda  What should I tell my health care provider before I take this medicine?  They need to know if you have any of these conditions:  -diabetes  -immune system problems  -inflammatory bowel disease  -liver disease  -lung or breathing disease  -lupus  -received or scheduled to receive an organ transplant or a stem-cell transplant that uses donor stem cells  -an unusual or allergic reaction to pembrolizumab, other medicines, foods, dyes, or preservatives  -pregnant or trying to get pregnant  -breast-feeding  How should I use this medicine?  This medicine is for infusion into a vein. It is given by a health care professional in a hospital or clinic setting.  A special MedGuide will be given to you before each treatment. Be sure to read this information carefully each time.  Talk to your pediatrician regarding the use of this medicine in children. While this drug may be prescribed for selected conditions, precautions do apply.  Overdosage: If you think you have taken too much of this medicine contact a poison control center or emergency room at once.  NOTE: This medicine is only for you. Do not share this medicine with others.  What if I miss a dose?  It is important not to miss your dose. Call your doctor or health care professional if you are unable to keep an appointment.  What may interact with this medicine?  Interactions have not been studied.  Give your health care provider a list of all the  medicines, herbs, non-prescription drugs, or dietary supplements you use. Also tell them if you smoke, drink alcohol, or use illegal drugs. Some items may interact with your medicine.  This list may not describe all possible interactions. Give your health care provider a list of all the medicines, herbs, non-prescription drugs, or dietary supplements you use. Also tell them if you smoke, drink alcohol, or use illegal drugs. Some items may interact with your medicine.  What should I watch for while using this medicine?  Your condition will be monitored carefully while you are receiving this medicine.  You may need blood work done while you are taking this medicine.  Do not become pregnant while taking this medicine or for 4 months after stopping it. Women should inform their doctor if they wish to become pregnant or think they might be pregnant. There is a potential for serious side effects to an unborn child. Talk to your health care professional or pharmacist for more information. Do not breast-feed an infant while taking this medicine or for 4 months after the last dose.  What side effects may I notice from receiving this medicine?  Side effects that you should report to your doctor or health care professional as soon as possible:  -allergic reactions like skin rash, itching or hives, swelling of the face, lips, or tongue  -bloody or black, tarry  -breathing problems  -changes in vision  -chest pain  -chills  -confusion  -constipation  -cough  -diarrhea  -dizziness or feeling faint or lightheaded  -  fast or irregular heartbeat  -fever  -flushing  -hair loss  -joint pain  -low blood counts - this medicine may decrease the number of white blood cells, red blood cells and platelets. You may be at increased risk for infections and bleeding.  -muscle pain  -muscle weakness  -persistent headache  -redness, blistering, peeling or loosening of the skin, including inside the mouth  -signs and symptoms of high blood sugar  such as dizziness; dry mouth; dry skin; fruity breath; nausea; stomach pain; increased hunger or thirst; increased urination  -signs and symptoms of kidney injury like trouble passing urine or change in the amount of urine  -signs and symptoms of liver injury like dark urine, light-colored stools, loss of appetite, nausea, right upper belly pain, yellowing of the eyes or skin  -sweating  -swollen lymph nodes  -weight loss  Side effects that usually do not require medical attention (report to your doctor or health care professional if they continue or are bothersome):  -decreased appetite  -muscle pain  -tiredness  This list may not describe all possible side effects. Call your doctor for medical advice about side effects. You may report side effects to FDA at 1-800-FDA-1088.  Where should I keep my medicine?  This drug is given in a hospital or clinic and will not be stored at home.  NOTE: This sheet is a summary. It may not cover all possible information. If you have questions about this medicine, talk to your doctor, pharmacist, or health care provider.   2019 Elsevier/Gold Standard (2018-02-22 15:06:10)

## 2018-12-06 LAB — TSH: TSH: 0.48 u[IU]/mL (ref 0.308–3.960)

## 2018-12-24 ENCOUNTER — Other Ambulatory Visit: Payer: Self-pay | Admitting: Medical

## 2018-12-25 ENCOUNTER — Other Ambulatory Visit: Payer: Self-pay | Admitting: *Deleted

## 2018-12-25 DIAGNOSIS — C670 Malignant neoplasm of trigone of bladder: Secondary | ICD-10-CM

## 2018-12-25 MED ORDER — OXYCODONE HCL 5 MG PO TABS: 5.0000 mg | ORAL_TABLET | Freq: Four times a day (QID) | ORAL | 0 refills | Status: DC | PRN

## 2018-12-26 ENCOUNTER — Inpatient Hospital Stay: Payer: Medicare Other

## 2018-12-26 ENCOUNTER — Inpatient Hospital Stay: Payer: Medicare Other | Attending: Hematology & Oncology | Admitting: Family

## 2018-12-26 ENCOUNTER — Encounter: Payer: Self-pay | Admitting: Family

## 2018-12-26 ENCOUNTER — Telehealth: Payer: Self-pay | Admitting: *Deleted

## 2018-12-26 ENCOUNTER — Other Ambulatory Visit: Payer: Self-pay | Admitting: Hematology & Oncology

## 2018-12-26 ENCOUNTER — Other Ambulatory Visit: Payer: Self-pay | Admitting: Family

## 2018-12-26 ENCOUNTER — Other Ambulatory Visit: Payer: Self-pay

## 2018-12-26 DIAGNOSIS — Z5112 Encounter for antineoplastic immunotherapy: Secondary | ICD-10-CM | POA: Diagnosis present

## 2018-12-26 DIAGNOSIS — C670 Malignant neoplasm of trigone of bladder: Secondary | ICD-10-CM

## 2018-12-26 DIAGNOSIS — Z79899 Other long term (current) drug therapy: Secondary | ICD-10-CM | POA: Diagnosis not present

## 2018-12-26 DIAGNOSIS — E039 Hypothyroidism, unspecified: Secondary | ICD-10-CM

## 2018-12-26 DIAGNOSIS — T451X5A Adverse effect of antineoplastic and immunosuppressive drugs, initial encounter: Secondary | ICD-10-CM

## 2018-12-26 DIAGNOSIS — C673 Malignant neoplasm of anterior wall of bladder: Secondary | ICD-10-CM | POA: Diagnosis present

## 2018-12-26 DIAGNOSIS — C672 Malignant neoplasm of lateral wall of bladder: Secondary | ICD-10-CM

## 2018-12-26 DIAGNOSIS — R110 Nausea: Secondary | ICD-10-CM

## 2018-12-26 DIAGNOSIS — R634 Abnormal weight loss: Secondary | ICD-10-CM

## 2018-12-26 LAB — CBC WITH DIFFERENTIAL (CANCER CENTER ONLY)
Abs Immature Granulocytes: 0.01 10*3/uL (ref 0.00–0.07)
Basophils Absolute: 0 10*3/uL (ref 0.0–0.1)
Basophils Relative: 0 %
Eosinophils Absolute: 0.1 10*3/uL (ref 0.0–0.5)
Eosinophils Relative: 3 %
HCT: 35.2 % — ABNORMAL LOW (ref 36.0–46.0)
Hemoglobin: 11.2 g/dL — ABNORMAL LOW (ref 12.0–15.0)
Immature Granulocytes: 0 %
Lymphocytes Relative: 20 %
Lymphs Abs: 0.8 10*3/uL (ref 0.7–4.0)
MCH: 28.3 pg (ref 26.0–34.0)
MCHC: 31.8 g/dL (ref 30.0–36.0)
MCV: 88.9 fL (ref 80.0–100.0)
Monocytes Absolute: 0.5 10*3/uL (ref 0.1–1.0)
Monocytes Relative: 13 %
Neutro Abs: 2.4 10*3/uL (ref 1.7–7.7)
Neutrophils Relative %: 64 %
Platelet Count: 173 10*3/uL (ref 150–400)
RBC: 3.96 MIL/uL (ref 3.87–5.11)
RDW: 15.4 % (ref 11.5–15.5)
WBC Count: 3.8 10*3/uL — ABNORMAL LOW (ref 4.0–10.5)
nRBC: 0 % (ref 0.0–0.2)

## 2018-12-26 LAB — CMP (CANCER CENTER ONLY)
ALT: 11 U/L (ref 0–44)
AST: 15 U/L (ref 15–41)
Albumin: 4 g/dL (ref 3.5–5.0)
Alkaline Phosphatase: 94 U/L (ref 38–126)
Anion gap: 8 (ref 5–15)
BUN: 14 mg/dL (ref 8–23)
CO2: 29 mmol/L (ref 22–32)
Calcium: 9.2 mg/dL (ref 8.9–10.3)
Chloride: 106 mmol/L (ref 98–111)
Creatinine: 0.86 mg/dL (ref 0.44–1.00)
GFR, Est AFR Am: >60 mL/min (ref >60)
GFR, Estimated: >60 mL/min (ref >60)
Glucose, Bld: 98 mg/dL (ref 70–99)
Potassium: 3.5 mmol/L (ref 3.5–5.1)
Sodium: 143 mmol/L (ref 135–145)
Total Bilirubin: 0.3 mg/dL (ref 0.3–1.2)
Total Protein: 7.1 g/dL (ref 6.5–8.1)

## 2018-12-26 LAB — IRON AND TIBC
Iron: 34 ug/dL — ABNORMAL LOW (ref 41–142)
Saturation Ratios: 10 % — ABNORMAL LOW (ref 21–57)
TIBC: 353 ug/dL (ref 236–444)
UIBC: 319 ug/dL (ref 120–384)

## 2018-12-26 LAB — FERRITIN: Ferritin: 10 ng/mL — ABNORMAL LOW (ref 11–307)

## 2018-12-26 MED ORDER — SODIUM CHLORIDE 0.9 % IV SOLN
200.0000 mg | Freq: Once | INTRAVENOUS | Status: AC
  Administered 2018-12-26: 11:00:00 200 mg via INTRAVENOUS
  Filled 2018-12-26: qty 8

## 2018-12-26 MED ORDER — SODIUM CHLORIDE 0.9% FLUSH
10.0000 mL | INTRAVENOUS | Status: DC | PRN
  Administered 2018-12-26: 10 mL
  Filled 2018-12-26: qty 10

## 2018-12-26 MED ORDER — HEPARIN SOD (PORK) LOCK FLUSH 100 UNIT/ML IV SOLN
500.0000 [IU] | Freq: Once | INTRAVENOUS | Status: AC | PRN
  Administered 2018-12-26: 500 [IU]
  Filled 2018-12-26: qty 5

## 2018-12-26 MED ORDER — MARINOL 2.5 MG PO CAPS: ORAL_CAPSULE | ORAL | 0 refills | Status: DC

## 2018-12-26 MED ORDER — SODIUM CHLORIDE 0.9 % IV SOLN
Freq: Once | INTRAVENOUS | Status: AC
  Administered 2018-12-26: 10:00:00 via INTRAVENOUS
  Filled 2018-12-26: qty 250

## 2018-12-26 NOTE — Telephone Encounter (Signed)
-----   Message from [name], MD sent at 12/26/2018  2:52 PM EDT ----- Please call and tell her the iron level is very low.  We will need to give her a dose of IV iron.  Please set this up.  Thanks

## 2018-12-26 NOTE — Patient Instructions (Signed)
Pembrolizumab injection  What is this medicine?  PEMBROLIZUMAB (pem broe liz ue mab) is a monoclonal antibody. It is used to treat cervical cancer, esophageal cancer, head and neck cancer, hepatocellular cancer, Hodgkin lymphoma, kidney cancer, lymphoma, melanoma, Merkel cell carcinoma, lung cancer, stomach cancer, urothelial cancer, and cancers that have a certain genetic condition.  This medicine may be used for other purposes; ask your health care provider or pharmacist if you have questions.  COMMON BRAND NAME(S): Keytruda  What should I tell my health care provider before I take this medicine?  They need to know if you have any of these conditions:  -diabetes  -immune system problems  -inflammatory bowel disease  -liver disease  -lung or breathing disease  -lupus  -received or scheduled to receive an organ transplant or a stem-cell transplant that uses donor stem cells  -an unusual or allergic reaction to pembrolizumab, other medicines, foods, dyes, or preservatives  -pregnant or trying to get pregnant  -breast-feeding  How should I use this medicine?  This medicine is for infusion into a vein. It is given by a health care professional in a hospital or clinic setting.  A special MedGuide will be given to you before each treatment. Be sure to read this information carefully each time.  Talk to your pediatrician regarding the use of this medicine in children. While this drug may be prescribed for selected conditions, precautions do apply.  Overdosage: If you think you have taken too much of this medicine contact a poison control center or emergency room at once.  NOTE: This medicine is only for you. Do not share this medicine with others.  What if I miss a dose?  It is important not to miss your dose. Call your doctor or health care professional if you are unable to keep an appointment.  What may interact with this medicine?  Interactions have not been studied.  Give your health care provider a list of all the  medicines, herbs, non-prescription drugs, or dietary supplements you use. Also tell them if you smoke, drink alcohol, or use illegal drugs. Some items may interact with your medicine.  This list may not describe all possible interactions. Give your health care provider a list of all the medicines, herbs, non-prescription drugs, or dietary supplements you use. Also tell them if you smoke, drink alcohol, or use illegal drugs. Some items may interact with your medicine.  What should I watch for while using this medicine?  Your condition will be monitored carefully while you are receiving this medicine.  You may need blood work done while you are taking this medicine.  Do not become pregnant while taking this medicine or for 4 months after stopping it. Women should inform their doctor if they wish to become pregnant or think they might be pregnant. There is a potential for serious side effects to an unborn child. Talk to your health care professional or pharmacist for more information. Do not breast-feed an infant while taking this medicine or for 4 months after the last dose.  What side effects may I notice from receiving this medicine?  Side effects that you should report to your doctor or health care professional as soon as possible:  -allergic reactions like skin rash, itching or hives, swelling of the face, lips, or tongue  -bloody or black, tarry  -breathing problems  -changes in vision  -chest pain  -chills  -confusion  -constipation  -cough  -diarrhea  -dizziness or feeling faint or lightheaded  -  fast or irregular heartbeat  -fever  -flushing  -hair loss  -joint pain  -low blood counts - this medicine may decrease the number of white blood cells, red blood cells and platelets. You may be at increased risk for infections and bleeding.  -muscle pain  -muscle weakness  -persistent headache  -redness, blistering, peeling or loosening of the skin, including inside the mouth  -signs and symptoms of high blood sugar  such as dizziness; dry mouth; dry skin; fruity breath; nausea; stomach pain; increased hunger or thirst; increased urination  -signs and symptoms of kidney injury like trouble passing urine or change in the amount of urine  -signs and symptoms of liver injury like dark urine, light-colored stools, loss of appetite, nausea, right upper belly pain, yellowing of the eyes or skin  -sweating  -swollen lymph nodes  -weight loss  Side effects that usually do not require medical attention (report to your doctor or health care professional if they continue or are bothersome):  -decreased appetite  -muscle pain  -tiredness  This list may not describe all possible side effects. Call your doctor for medical advice about side effects. You may report side effects to FDA at 1-800-FDA-1088.  Where should I keep my medicine?  This drug is given in a hospital or clinic and will not be stored at home.  NOTE: This sheet is a summary. It may not cover all possible information. If you have questions about this medicine, talk to your doctor, pharmacist, or health care provider.   2019 Elsevier/Gold Standard (2018-02-22 15:06:10)

## 2018-12-26 NOTE — Telephone Encounter (Signed)
Notified pt of lab results, pt to expect a call from scheduling for IV Iron appt. Pt verbalized understanding.

## 2018-12-26 NOTE — Progress Notes (Signed)
Hematology and Oncology Follow Up Visit  Donna Curry [id number] 03-Mar-1956 63 y.o. 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 21 days --s/p cycle #1-- start on 11/14/2018 IV iron as indicated   Interim History:  [name] is here today for follow-up. She is doing well but has had some lower back discomfort off and on. She states that she is taking the oxycodone as prescribed and it is effective.  No issues with weakness. No syncope, falls or incontinence.  Her baseline SOB with exertion is stable and unchanged. She states that she has not smoked in 6 weeks! We are so very proud of her! No fever, chills, n/v, cough, rash, dizziness, chest pain, palpitations, abdominal pain or changes in bowel or bladder habits.  No swelling, tenderness, numbness or tingling in her extremities.  No lymphadenopathy noted on exam.  She states that she has a good appetite and is also supplementing some with Ensure. She is staying well hydrated. Her weight is stable at 73 lbs.   ECOG Performance Status: 1 - Symptomatic but completely ambulatory  Medications:  Allergies as of 12/26/2018   No Known Allergies     Medication List       Accurate as of December 26, 2018  9:58 AM. If you have any questions, ask your nurse or doctor.        acetaminophen 500 MG tablet Commonly known as:  TYLENOL Take 1,000 mg by mouth every 8 (eight) hours as needed for moderate pain.   albuterol 108 (90 Base) MCG/ACT inhaler Commonly known as:  VENTOLIN HFA Inhale 2 puffs into the lungs every 6 (six) hours as needed for wheezing or shortness of breath.   albuterol (2.5 MG/3ML) 0.083% nebulizer solution Commonly known as:  PROVENTIL Take 3 mLs (2.5 mg total) by nebulization every 4  (four) hours as needed for wheezing or shortness of breath.   amLODipine 10 MG tablet Commonly known as:  NORVASC Take 1 tablet by mouth once daily   aspirin EC 81 MG tablet Take 81 mg by mouth daily.   budesonide-formoterol 160-4.5 MCG/ACT inhaler Commonly known as:  Symbicort Inhale 2 puffs into the lungs 2 (two) times daily.   ergocalciferol 1.25 MG (50000 UT) capsule Commonly known as:  VITAMIN D2 Take 1 capsule (50,000 Units total) by mouth once a week.   Ipratropium-Albuterol 20-100 MCG/ACT Aers respimat Commonly known as:  COMBIVENT Inhale 1 puff into the lungs every 6 (six) hours.   lidocaine-prilocaine cream Commonly known as:  EMLA Apply to affected area once   lidocaine-prilocaine cream Commonly known as:  EMLA Apply 1 application topically as needed.   Marinol 2.5 MG capsule Generic drug:  dronabinol TAKE 1 CAPSULE BY MOUTH TWICE DAILY BEFORE A MEAL   nicotine 14 mg/24hr patch Commonly known as:  NICODERM CQ - dosed in mg/24 hours Place 1 patch (14 mg total) onto the skin daily. What changed:    when to take this  reasons to take this   nitrofurantoin 100 MG capsule Commonly known as:  MACRODANTIN Take 100 mg by mouth 2 (two) times daily.   oxyCODONE 5 MG immediate release tablet Commonly known as:  Oxy IR/ROXICODONE Take 1 tablet (5 mg total) by mouth every 6 (six) hours as needed for severe pain.  Trelegy Ellipta 100-62.5-25 MCG/INH Aepb Generic drug:  Fluticasone-Umeclidin-Vilant INHALE 1 PUFF ONCE DAILY   varenicline 0.5 MG X 11 & 1 MG X 42 tablet Commonly known as:  Chantix Starting Month Pak TAKE AS DIRECTED ON  PACKAGE  INSTRUCTIONS       Allergies: No Known Allergies  Past Medical History, Surgical history, Social history, and Family History were reviewed and updated.  Review of Systems: All other 10 point review of systems is negative.   Physical Exam:  vitals were not taken for this visit.   Wt Readings from Last 3  Encounters:  12/26/18 73 lb (33.1 kg)  12/05/18 73 lb (33.1 kg)  11/05/18 74 lb (33.6 kg)    Ocular: Sclerae unicteric, pupils equal, round and reactive to light Ear-nose-throat: Oropharynx clear, dentition fair Lymphatic: No cervical or supraclavicular adenopathy Lungs no rales or rhonchi, good excursion bilaterally Heart regular rate and rhythm, no murmur appreciated Abd soft, nontender, positive bowel sounds, no liver or spleen tip palpated on exam, no fluid wave  MSK no focal spinal tenderness, no joint edema Neuro: non-focal, well-oriented, appropriate affect Breasts: Deferred   Lab Results  Component Value Date   WBC 3.8 (L) 12/26/2018   HGB 11.2 (L) 12/26/2018   HCT 35.2 (L) 12/26/2018   MCV 88.9 12/26/2018   PLT 173 12/26/2018   Lab Results  Component Value Date   FERRITIN 18 11/05/2018   IRON 27 (L) 11/05/2018   TIBC 323 11/05/2018   UIBC 296 11/05/2018   IRONPCTSAT 8 (L) 11/05/2018   Lab Results  Component Value Date   RBC 3.96 12/26/2018   No results found for: KPAFRELGTCHN, LAMBDASER, KAPLAMBRATIO No results found for: IGGSERUM, IGA, IGMSERUM No results found for: Kathrynn Ducking, MSPIKE, SPEI   Chemistry      Component Value Date/Time   NA 140 12/05/2018 1330   K 3.3 (L) 12/05/2018 1330   CL 104 12/05/2018 1330   CO2 28 12/05/2018 1330   BUN 15 12/05/2018 1330   CREATININE 1.00 12/05/2018 1330   CREATININE 1.10 08/20/2013 1027      Component Value Date/Time   CALCIUM 9.5 12/05/2018 1330   ALKPHOS 91 12/05/2018 1330   AST 13 (L) 12/05/2018 1330   ALT 11 12/05/2018 1330   BILITOT 0.2 (L) 12/05/2018 1330       Impression and Plan: Donna Curry is a very pleasant 63 yo African American female with 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 So far, she continues to do well and is tolerating treatment nicely. We will proceed with cycle 3 today as planned.  We will repeat her  PET scan after cycle 4.   We will plan to see her back in another 3 weeks.  She will contact our office with any questions or concerns. We can certainly see her sooner if need be.   Laverna Peace, NP 6/3/20209:58 AM

## 2019-01-01 ENCOUNTER — Other Ambulatory Visit: Payer: Self-pay | Admitting: Family

## 2019-01-02 ENCOUNTER — Telehealth: Payer: Self-pay | Admitting: Hematology & Oncology

## 2019-01-02 ENCOUNTER — Other Ambulatory Visit: Payer: Self-pay | Admitting: Family

## 2019-01-02 ENCOUNTER — Encounter: Payer: Self-pay | Admitting: Family

## 2019-01-02 NOTE — Telephone Encounter (Signed)
Called and lmvm for patient with date/time of appointment per 6/10 sch msg

## 2019-01-03 ENCOUNTER — Ambulatory Visit: Payer: Medicare Other

## 2019-01-11 ENCOUNTER — Other Ambulatory Visit: Payer: Self-pay | Admitting: Family

## 2019-01-14 ENCOUNTER — Other Ambulatory Visit: Payer: Self-pay

## 2019-01-14 ENCOUNTER — Ambulatory Visit (INDEPENDENT_AMBULATORY_CARE_PROVIDER_SITE_OTHER): Payer: Medicare Other | Admitting: Family

## 2019-01-14 ENCOUNTER — Telehealth: Payer: Self-pay | Admitting: Family

## 2019-01-14 DIAGNOSIS — M545 Low back pain, unspecified: Secondary | ICD-10-CM

## 2019-01-14 DIAGNOSIS — F17200 Nicotine dependence, unspecified, uncomplicated: Secondary | ICD-10-CM | POA: Diagnosis not present

## 2019-01-14 DIAGNOSIS — I10 Essential (primary) hypertension: Secondary | ICD-10-CM | POA: Diagnosis not present

## 2019-01-14 DIAGNOSIS — C670 Malignant neoplasm of trigone of bladder: Secondary | ICD-10-CM | POA: Diagnosis not present

## 2019-01-14 MED ORDER — METHYLPREDNISOLONE 4 MG PO TBPK: ORAL_TABLET | ORAL | 0 refills | Status: DC

## 2019-01-14 NOTE — Telephone Encounter (Signed)
Return in about 3 months (around 04/16/2019). LEFT MSG FOR Patient to call back
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
## 2019-01-16 ENCOUNTER — Other Ambulatory Visit: Payer: Self-pay

## 2019-01-16 ENCOUNTER — Inpatient Hospital Stay: Payer: Medicare Other

## 2019-01-16 ENCOUNTER — Other Ambulatory Visit: Payer: Self-pay | Admitting: *Deleted

## 2019-01-16 ENCOUNTER — Encounter: Payer: Self-pay | Admitting: Hematology & Oncology

## 2019-01-16 ENCOUNTER — Ambulatory Visit (HOSPITAL_BASED_OUTPATIENT_CLINIC_OR_DEPARTMENT_OTHER): Payer: Medicare Other

## 2019-01-16 ENCOUNTER — Inpatient Hospital Stay (HOSPITAL_BASED_OUTPATIENT_CLINIC_OR_DEPARTMENT_OTHER): Payer: Medicare Other | Admitting: Hematology & Oncology

## 2019-01-16 VITALS — BP 140/92 | HR 99 | Temp 98.0°F | Resp 17 | Wt 72.0 lb

## 2019-01-16 DIAGNOSIS — C672 Malignant neoplasm of lateral wall of bladder: Secondary | ICD-10-CM | POA: Diagnosis not present

## 2019-01-16 DIAGNOSIS — C779 Secondary and unspecified malignant neoplasm of lymph node, unspecified: Secondary | ICD-10-CM

## 2019-01-16 DIAGNOSIS — E039 Hypothyroidism, unspecified: Secondary | ICD-10-CM

## 2019-01-16 DIAGNOSIS — D500 Iron deficiency anemia secondary to blood loss (chronic): Secondary | ICD-10-CM

## 2019-01-16 DIAGNOSIS — C670 Malignant neoplasm of trigone of bladder: Secondary | ICD-10-CM

## 2019-01-16 DIAGNOSIS — Z5112 Encounter for antineoplastic immunotherapy: Secondary | ICD-10-CM | POA: Diagnosis not present

## 2019-01-16 DIAGNOSIS — C673 Malignant neoplasm of anterior wall of bladder: Secondary | ICD-10-CM

## 2019-01-16 DIAGNOSIS — Z79899 Other long term (current) drug therapy: Secondary | ICD-10-CM

## 2019-01-16 LAB — CMP (CANCER CENTER ONLY)
ALT: 11 U/L (ref 0–44)
AST: 14 U/L — ABNORMAL LOW (ref 15–41)
Albumin: 4.3 g/dL (ref 3.5–5.0)
Alkaline Phosphatase: 94 U/L (ref 38–126)
Anion gap: 8 (ref 5–15)
BUN: 11 mg/dL (ref 8–23)
CO2: 30 mmol/L (ref 22–32)
Calcium: 9.9 mg/dL (ref 8.9–10.3)
Chloride: 103 mmol/L (ref 98–111)
Creatinine: 0.88 mg/dL (ref 0.44–1.00)
GFR, Est AFR Am: >60 mL/min (ref >60)
GFR, Estimated: >60 mL/min (ref >60)
Glucose, Bld: 90 mg/dL (ref 70–99)
Potassium: 3.5 mmol/L (ref 3.5–5.1)
Sodium: 141 mmol/L (ref 135–145)
Total Bilirubin: 0.3 mg/dL (ref 0.3–1.2)
Total Protein: 6.9 g/dL (ref 6.5–8.1)

## 2019-01-16 LAB — CBC WITH DIFFERENTIAL (CANCER CENTER ONLY)
Abs Immature Granulocytes: 0.01 10*3/uL (ref 0.00–0.07)
Basophils Absolute: 0 10*3/uL (ref 0.0–0.1)
Basophils Relative: 0 %
Eosinophils Absolute: 0.1 10*3/uL (ref 0.0–0.5)
Eosinophils Relative: 2 %
HCT: 36.9 % (ref 36.0–46.0)
Hemoglobin: 11.9 g/dL — ABNORMAL LOW (ref 12.0–15.0)
Immature Granulocytes: 0 %
Lymphocytes Relative: 19 %
Lymphs Abs: 0.9 10*3/uL (ref 0.7–4.0)
MCH: 28.3 pg (ref 26.0–34.0)
MCHC: 32.2 g/dL (ref 30.0–36.0)
MCV: 87.9 fL (ref 80.0–100.0)
Monocytes Absolute: 0.6 10*3/uL (ref 0.1–1.0)
Monocytes Relative: 13 %
Neutro Abs: 3.1 10*3/uL (ref 1.7–7.7)
Neutrophils Relative %: 66 %
Platelet Count: 189 10*3/uL (ref 150–400)
RBC: 4.2 MIL/uL (ref 3.87–5.11)
RDW: 17.1 % — ABNORMAL HIGH (ref 11.5–15.5)
WBC Count: 4.8 10*3/uL (ref 4.0–10.5)
nRBC: 0 % (ref 0.0–0.2)

## 2019-01-16 LAB — LACTATE DEHYDROGENASE: LDH: 223 U/L — ABNORMAL HIGH (ref 98–192)

## 2019-01-16 LAB — TSH: TSH: 0.822 u[IU]/mL (ref 0.308–3.960)

## 2019-01-16 MED ORDER — SODIUM CHLORIDE 0.9 % IV SOLN
200.0000 mg | Freq: Once | INTRAVENOUS | Status: AC
  Administered 2019-01-16: 200 mg via INTRAVENOUS
  Filled 2019-01-16: qty 8

## 2019-01-16 MED ORDER — PROCHLORPERAZINE MALEATE 10 MG PO TABS: 10.0000 mg | ORAL_TABLET | Freq: Four times a day (QID) | ORAL | 0 refills | Status: DC | PRN

## 2019-01-16 MED ORDER — SODIUM CHLORIDE 0.9 % IV SOLN
Freq: Once | INTRAVENOUS | Status: AC
  Filled 2019-01-16: qty 250

## 2019-01-16 MED ORDER — SODIUM CHLORIDE 0.9 % IV SOLN
Freq: Once | INTRAVENOUS | Status: AC
  Administered 2019-01-16: 13:00:00 via INTRAVENOUS
  Filled 2019-01-16: qty 250

## 2019-01-16 MED ORDER — SODIUM CHLORIDE 0.9 % IV SOLN
40.0000 mg | Freq: Once | INTRAVENOUS | Status: AC
  Administered 2019-01-16: 40 mg via INTRAVENOUS
  Filled 2019-01-16: qty 4

## 2019-01-16 MED ORDER — HYDROMORPHONE HCL 1 MG/ML IJ SOLN
1.0000 mg | Freq: Once | INTRAMUSCULAR | Status: AC
  Administered 2019-01-16: 1 mg via INTRAVENOUS

## 2019-01-16 MED ORDER — ONDANSETRON HCL 8 MG PO TABS: 8.0000 mg | ORAL_TABLET | Freq: Three times a day (TID) | ORAL | 0 refills | Status: DC | PRN

## 2019-01-16 MED ORDER — SODIUM CHLORIDE 0.9% FLUSH
10.0000 mL | INTRAVENOUS | Status: DC | PRN
  Administered 2019-01-16: 10 mL
  Filled 2019-01-16: qty 10

## 2019-01-16 MED ORDER — OXYCODONE HCL 5 MG PO TABS: 5.0000 mg | ORAL_TABLET | Freq: Four times a day (QID) | ORAL | 0 refills | Status: DC | PRN

## 2019-01-16 MED ORDER — MEGESTROL ACETATE 20 MG PO TABS: 20.0000 mg | ORAL_TABLET | Freq: Every day | ORAL | 0 refills | Status: DC

## 2019-01-16 MED ORDER — HEPARIN SOD (PORK) LOCK FLUSH 100 UNIT/ML IV SOLN
500.0000 [IU] | Freq: Once | INTRAVENOUS | Status: AC | PRN
  Administered 2019-01-16: 500 [IU]
  Filled 2019-01-16: qty 5

## 2019-01-16 MED ORDER — HYDROMORPHONE HCL 1 MG/ML IJ SOLN
INTRAMUSCULAR | Status: AC
  Filled 2019-01-16: qty 1

## 2019-01-16 MED ORDER — SODIUM CHLORIDE 0.9 % IV SOLN
510.0000 mg | Freq: Once | INTRAVENOUS | Status: AC
  Administered 2019-01-16: 510 mg via INTRAVENOUS
  Filled 2019-01-16: qty 510
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
## 2019-01-16 NOTE — Patient Instructions (Addendum)
Ferumoxytol injection What is this medicine? FERUMOXYTOL is an iron complex. Iron is used to make healthy red blood cells, which carry oxygen and nutrients throughout the body. This medicine is used to treat iron deficiency anemia. This medicine may be used for other purposes; ask your health care provider or pharmacist if you have questions. COMMON BRAND NAME(S): Feraheme What should I tell my health care provider before I take this medicine? They need to know if you have any of these conditions: -anemia not caused by low iron levels -high levels of iron in the blood -magnetic resonance imaging (MRI) test scheduled -an unusual or allergic reaction to iron, other medicines, foods, dyes, or preservatives -pregnant or trying to get pregnant -breast-feeding How should I use this medicine? This medicine is for injection into a vein. It is given by a health care professional in a hospital or clinic setting. Talk to your pediatrician regarding the use of this medicine in children. Special care may be needed. Overdosage: If you think you have taken too much of this medicine contact a poison control center or emergency room at once. NOTE: This medicine is only for you. Do not share this medicine with others. What if I miss a dose? It is important not to miss your dose. Call your doctor or health care professional if you are unable to keep an appointment. What may interact with this medicine? This medicine may interact with the following medications: -other iron products This list may not describe all possible interactions. Give your health care provider a list of all the medicines, herbs, non-prescription drugs, or dietary supplements you use. Also tell them if you smoke, drink alcohol, or use illegal drugs. Some items may interact with your medicine. What should I watch for while using this medicine? Visit your doctor or healthcare professional regularly. Tell your doctor or healthcare professional  if your symptoms do not start to get better or if they get worse. You may need blood work done while you are taking this medicine. You may need to follow a special diet. Talk to your doctor. Foods that contain iron include: whole grains/cereals, dried fruits, beans, or peas, leafy green vegetables, and organ meats (liver, kidney). What side effects may I notice from receiving this medicine? Side effects that you should report to your doctor or health care professional as soon as possible: -allergic reactions like skin rash, itching or hives, swelling of the face, lips, or tongue -breathing problems -changes in blood pressure -feeling faint or lightheaded, falls -fever or chills -flushing, sweating, or hot feelings -swelling of the ankles or feet Side effects that usually do not require medical attention (report to your doctor or health care professional if they continue or are bothersome): -diarrhea -headache -nausea, vomiting -stomach pain This list may not describe all possible side effects. Call your doctor for medical advice about side effects. You may report side effects to FDA at 1-800-FDA-1088. Where should I keep my medicine? This drug is given in a hospital or clinic and will not be stored at home. NOTE: This sheet is a summary. It may not cover all possible information. If you have questions about this medicine, talk to your doctor, pharmacist, or health care provider.  2019 Elsevier/Gold Standard (2016-08-29 20:21:10) Aluminum Hydroxide; Magnesium Hydroxide; Simethicone oral suspension What is this medicine? ALUMINUM HYDROXIDE; MAGNESIUM HYDROXIDE; SIMETHICONE (a LOO mi num hye DROX ide; mag NEE zhum hye DROX ide; sye METH i kone) is an antacid and antigas medicine. It is used  to relieve the symptoms of indigestion, heartburn, sour stomach, and the discomfort caused by gas. This medicine may be used for other purposes; ask your health care provider or pharmacist if you have  questions. COMMON BRAND NAME(S): Alamag Plus, Aldroxicon-1, Almacone, Almacone Double Strength, GERI-LANTA, Geri-Mox, Maalox, Maalox Advanced, Maalox Max, Maalox Max Plus [place], Maalox Multi-Symptom, Mi-Acid, Mintox, Mylanta, Rulox, Rulox Plus, Uni-Lan, Uni-Lan II What should I tell my health care provider before I take this medicine? They need to know if you have any of these conditions: -bowel, intestinal, or stomach disease -constipation -diarrhea -kidney disease -liver disease -on a sodium (salt) restricted diet -stomach bleeding or obstruction -an unusual or allergic reaction to aluminum hydroxide, magnesium hydroxide, simethicone, other medicines, foods, dyes, or preservatives -pregnant or trying to get pregnant -breast-feeding How should I use this medicine? Take this medicine by mouth. Follow the directions on the label. Shake well before using. Use a specially marked spoon or container to measure your medicine. Ask your pharmacist if you do not have one. Household spoons are not accurate. Antacids are usually taken after meals and at bedtime or as directed by your doctor or health care professional. After taking the medication, drink a full glass of water. Take your doses at regular intervals. Do not take your medicine more often than directed. Talk to your pediatrician regarding the use of this medicine in children. While this drug may be prescribed for children as young as 28 years old for selected conditions, precautions do apply. Overdosage: If you think you have taken too much of this medicine contact a poison control center or emergency room at once. NOTE: This medicine is only for you. Do not share this medicine with others. What if I miss a dose? If you miss a dose, take it as soon as you can. If it is almost time for your next dose, take only that dose. Do not take double or extra doses. What may interact with this  medicine? -amphetamine -antibiotics -captopril -delavirdine -gabapentin -heart medicines, such as digoxin or digitoxin -hyoscyamine -iron salts -isoniazid -medicines for breathing difficulties like ipratropium and tiotropium -medicines for diabetes -medicines for fungal infections like itraconazole and ketoconazole -medicines for osteoporosis like alendronate, etidronate, risedronate and tiludronate -medicines for overactive bladder like oxybutynin and tolterodine -medicines for seizures like ethotoin and phenytoin -methenamine -mycophenolate -pancrelipase -penicillamine -phenothiazines like chlorpromazine, mesoridazine, prochlorperazine, thioridazine -quinidine -rosuvastatin -sodium fluoride -sodium polystyrene sulfonate -sotalol -sucralfate -tacrolimus -thyroid hormones like levothyroxine -ursodiol -vitamin D -zalcitabine This list may not describe all possible interactions. Give your health care provider a list of all the medicines, herbs, non-prescription drugs, or dietary supplements you use. Also tell them if you smoke, drink alcohol, or use illegal drugs. Some items may interact with your medicine. What should I watch for while using this medicine? Tell your doctor or healthcare professional if your symptoms do not start to get better or if they get worse. Do not treat yourself for stomach problems with this medicine for more than 2 weeks. See a doctor if you have black tarry stools, rectal bleeding, or if you feel unusually tired. Do not change to another antacid product without advice. If you are taking other medicines, leave an interval of at least 2 hours before or after taking this medicine. To help reduce constipation, drink several glasses of water a day. What side effects may I notice from receiving this medicine? Side effects that you should report to your doctor or health care professional as soon as possible: -  allergic reactions like skin rash, itching or  hives, swelling of the face, lips, or tongue -bone or joint aches and pains -confusion or irritability -headache -loss of appetite -nausea, vomiting -unusually weak or tired Side effects that usually do not require medical attention (report to your doctor or health care professional if they continue or are bothersome): -chalky taste -constipation -diarrhea -hemorrhoids This list may not describe all possible side effects. Call your doctor for medical advice about side effects. You may report side effects to FDA at 1-800-FDA-1088. Where should I keep my medicine? Keep out of the reach of children. Store at room temperature between 15 and 30 degrees C (59 and 86 degrees F). Do not freeze. Protect from light and moisture. Throw away any unused medicine after the expiration date. NOTE: This sheet is a summary. It may not cover all possible information. If you have questions about this medicine, talk to your doctor, pharmacist, or health care provider.  2019 Elsevier/Gold Standard (2007-10-04 11:14:19) Pembrolizumab injection What is this medicine? PEMBROLIZUMAB (pem broe liz ue mab) is a monoclonal antibody. It is used to treat cervical cancer, esophageal cancer, head and neck cancer, hepatocellular cancer, Hodgkin lymphoma, kidney cancer, lymphoma, melanoma, Merkel cell carcinoma, lung cancer, stomach cancer, urothelial cancer, and cancers that have a certain genetic condition. This medicine may be used for other purposes; ask your health care provider or pharmacist if you have questions. COMMON BRAND NAME(S): Keytruda What should I tell my health care provider before I take this medicine? They need to know if you have any of these conditions: -diabetes -immune system problems -inflammatory bowel disease -liver disease -lung or breathing disease -lupus -received or scheduled to receive an organ transplant or a stem-cell transplant that uses donor stem cells -an unusual or allergic  reaction to pembrolizumab, other medicines, foods, dyes, or preservatives -pregnant or trying to get pregnant -breast-feeding How should I use this medicine? This medicine is for infusion into a vein. It is given by a health care professional in a hospital or clinic setting. A special MedGuide will be given to you before each treatment. Be sure to read this information carefully each time. Talk to your pediatrician regarding the use of this medicine in children. While this drug may be prescribed for selected conditions, precautions do apply. Overdosage: If you think you have taken too much of this medicine contact a poison control center or emergency room at once. NOTE: This medicine is only for you. Do not share this medicine with others. What if I miss a dose? It is important not to miss your dose. Call your doctor or health care professional if you are unable to keep an appointment. What may interact with this medicine? Interactions have not been studied. Give your health care provider a list of all the medicines, herbs, non-prescription drugs, or dietary supplements you use. Also tell them if you smoke, drink alcohol, or use illegal drugs. Some items may interact with your medicine. This list may not describe all possible interactions. Give your health care provider a list of all the medicines, herbs, non-prescription drugs, or dietary supplements you use. Also tell them if you smoke, drink alcohol, or use illegal drugs. Some items may interact with your medicine. What should I watch for while using this medicine? Your condition will be monitored carefully while you are receiving this medicine. You may need blood work done while you are taking this medicine. Do not become pregnant while taking this medicine or for 4 months  after stopping it. Women should inform their doctor if they wish to become pregnant or think they might be pregnant. There is a potential for serious side effects to an  unborn child. Talk to your health care professional or pharmacist for more information. Do not breast-feed an infant while taking this medicine or for 4 months after the last dose. What side effects may I notice from receiving this medicine? Side effects that you should report to your doctor or health care professional as soon as possible: -allergic reactions like skin rash, itching or hives, swelling of the face, lips, or tongue -bloody or black, tarry -breathing problems -changes in vision -chest pain -chills -confusion -constipation -cough -diarrhea -dizziness or feeling faint or lightheaded -fast or irregular heartbeat -fever -flushing -hair loss -joint pain -low blood counts - this medicine may decrease the number of white blood cells, red blood cells and platelets. You may be at increased risk for infections and bleeding. -muscle pain -muscle weakness -persistent headache -redness, blistering, peeling or loosening of the skin, including inside the mouth -signs and symptoms of high blood sugar such as dizziness; dry mouth; dry skin; fruity breath; nausea; stomach pain; increased hunger or thirst; increased urination -signs and symptoms of kidney injury like trouble passing urine or change in the amount of urine -signs and symptoms of liver injury like dark urine, light-colored stools, loss of appetite, nausea, right upper belly pain, yellowing of the eyes or skin -sweating -swollen lymph nodes -weight loss Side effects that usually do not require medical attention (report to your doctor or health care professional if they continue or are bothersome): -decreased appetite -muscle pain -tiredness This list may not describe all possible side effects. Call your doctor for medical advice about side effects. You may report side effects to FDA at 1-800-FDA-1088. Where should I keep my medicine? This drug is given in a hospital or clinic and will not be stored at home. NOTE: This  sheet is a summary. It may not cover all possible information. If you have questions about this medicine, talk to your doctor, pharmacist, or health care provider.  2019 Elsevier/Gold Standard (2018-02-22 15:06:10)

## 2019-01-16 NOTE — Progress Notes (Signed)
Hematology and Oncology Follow Up Visit  Donna Curry [id number] 01/23/1956 63 y.o. 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 21 days --s/p cycle #1-- start on 11/14/2018 IV iron as indicated   Interim History:  Donna Curry is here today for follow-up.  For some reason, she is having a lot of indigestion this morning.  This started when she got to our office and had a soft drink.  Not sure exactly what triggered this.  Otherwise he seems to be doing pretty well.  She is having some back discomfort.  She saw her family doctor yesterday.  She has had no problems with nausea or vomiting.  There is been no issues with diarrhea.  I will bit of constipation.  Her weight is holding stable.  Her problem is that there is some family at her house that she needs to take out.  There apparently caused her a lot of stress which is certainly not good for her.  She has had no bleeding.  She is had no fever.  She says her appetite is doing pretty well.  She just does not gain weight.  Overall, her performance status is ECOG 2.  Medications:  Allergies as of 01/16/2019   No Known Allergies     Medication List       Accurate as of January 16, 2019 12:19 PM. If you have any questions, ask your nurse or doctor.        acetaminophen 500 MG tablet Commonly known as: TYLENOL Take 1,000 mg by mouth every 8 (eight) hours as needed for moderate pain.   albuterol (2.5 MG/3ML) 0.083% nebulizer solution Commonly known as: PROVENTIL Take 3 mLs (2.5 mg total) by nebulization every 4 (four) hours as needed for wheezing or shortness of breath.   amLODipine 10 MG tablet Commonly known as: NORVASC Take 1 tablet by mouth once daily   aspirin EC 81 MG tablet Take  81 mg by mouth daily.   budesonide-formoterol 160-4.5 MCG/ACT inhaler Commonly known as: Symbicort Inhale 2 puffs into the lungs 2 (two) times daily.   lidocaine-prilocaine cream Commonly known as: EMLA Apply to affected area once   lidocaine-prilocaine cream Commonly known as: EMLA Apply 1 application topically as needed.   Marinol 2.5 MG capsule Generic drug: dronabinol TAKE 1 CAPSULE BY MOUTH TWICE DAILY BEFORE A MEAL   methylPREDNISolone 4 MG Tbpk tablet Commonly known as: MEDROL DOSEPAK Please take per package instructions.   nitrofurantoin 100 MG capsule Commonly known as: MACRODANTIN Take 100 mg by mouth 2 (two) times daily.   oxyCODONE 5 MG immediate release tablet Commonly known as: Oxy IR/ROXICODONE Take 1 tablet (5 mg total) by mouth every 6 (six) hours as needed for severe pain.   Vitamin D (Ergocalciferol) 1.25 MG (50000 UT) Caps capsule Commonly known as: DRISDOL Take 1 capsule by mouth once a week       Allergies: No Known Allergies  Past Medical History, Surgical history, Social history, and Family History were reviewed and updated.  Review of Systems: Review of Systems  Constitutional: Negative.   HENT: Negative.   Eyes: Negative.   Respiratory: Positive for wheezing.   Cardiovascular: Negative.   Gastrointestinal: Positive for abdominal pain.  Genitourinary: Negative.   Musculoskeletal: Positive for back pain.  Skin: Negative.  Neurological: Negative.   Endo/Heme/Allergies: Negative.   Psychiatric/Behavioral: Negative.       Physical Exam:  weight is 72 lb (32.7 kg). Her oral temperature is 98 F (36.7 C). Her blood pressure is 140/92 (abnormal) and her pulse is 99. Her respiration is 17 and oxygen saturation is 100%.   Wt Readings from Last 3 Encounters:  01/16/19 72 lb (32.7 kg)  12/26/18 73 lb (33.1 kg)  12/05/18 73 lb (33.1 kg)    Physical Exam Vitals signs reviewed.  HENT:     Head: Normocephalic and atraumatic.  Eyes:      Pupils: Pupils are equal, round, and reactive to light.  Neck:     Musculoskeletal: Normal range of motion.  Cardiovascular:     Rate and Rhythm: Normal rate and regular rhythm.     Heart sounds: Normal heart sounds.  Pulmonary:     Effort: Pulmonary effort is normal.     Breath sounds: Normal breath sounds.  Abdominal:     General: Bowel sounds are normal.     Palpations: Abdomen is soft.  Musculoskeletal: Normal range of motion.        General: No tenderness or deformity.  Lymphadenopathy:     Cervical: No cervical adenopathy.  Skin:    General: Skin is warm and dry.     Findings: No erythema or rash.  Neurological:     Mental Status: She is alert and oriented to person, place, and time.  Psychiatric:        Behavior: Behavior normal.        Thought Content: Thought content normal.        Judgment: Judgment normal.      Lab Results  Component Value Date   WBC 4.8 01/16/2019   HGB 11.9 (L) 01/16/2019   HCT 36.9 01/16/2019   MCV 87.9 01/16/2019   PLT 189 01/16/2019   Lab Results  Component Value Date   FERRITIN 10 (L) 12/26/2018   IRON 34 (L) 12/26/2018   TIBC 353 12/26/2018   UIBC 319 12/26/2018   IRONPCTSAT 10 (L) 12/26/2018   Lab Results  Component Value Date   RBC 4.20 01/16/2019   No results found for: KPAFRELGTCHN, LAMBDASER, KAPLAMBRATIO No results found for: IGGSERUM, IGA, IGMSERUM No results found for: [name], SPEI   Chemistry      Component Value Date/Time   NA 141 01/16/2019 1120   K 3.5 01/16/2019 1120   CL 103 01/16/2019 1120   CO2 30 01/16/2019 1120   BUN 11 01/16/2019 1120   CREATININE 0.88 01/16/2019 1120   CREATININE 1.10 08/20/2013 1027      Component Value Date/Time   CALCIUM 9.9 01/16/2019 1120   ALKPHOS 94 01/16/2019 1120   AST 14 (L) 01/16/2019 1120   ALT 11 01/16/2019 1120   BILITOT 0.3 01/16/2019 1120       Impression and Plan: Donna Curry is a very pleasant  63 yo African American female with 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  Again, we have her on pembrolizumab.  She is doing okay with this as far as I can tell.  She level her fourth cycle of treatment today.  I will then going get a PET scan on her.  I will plan to see her back 4 weeks.  Hopefully, we will see that everything is looking stable on her PET scan.   [name], MD 6/24/202012:19 PM

## 2019-01-16 NOTE — Patient Instructions (Signed)

## 2019-01-21 ENCOUNTER — Other Ambulatory Visit: Payer: Self-pay | Admitting: Family

## 2019-01-21 DIAGNOSIS — C673 Malignant neoplasm of anterior wall of bladder: Secondary | ICD-10-CM

## 2019-01-21 MED ORDER — DRONABINOL 2.5 MG PO CAPS: 5.0000 mg | ORAL_CAPSULE | Freq: Two times a day (BID) | ORAL | 2 refills | Status: DC

## 2019-01-21 NOTE — Progress Notes (Signed)
Marinol script changed to Dronabinol 2.5 mg PO BID with meals per her insurance coverage.

## 2019-01-22 ENCOUNTER — Telehealth: Payer: Self-pay | Admitting: Hematology & Oncology

## 2019-01-22 NOTE — Telephone Encounter (Signed)
Called and LMVM for patient with date/time/location/instructions for upcoming PET Scan

## 2019-01-23 ENCOUNTER — Ambulatory Visit (HOSPITAL_BASED_OUTPATIENT_CLINIC_OR_DEPARTMENT_OTHER): Payer: Medicare Other

## 2019-01-29 ENCOUNTER — Other Ambulatory Visit: Payer: Self-pay | Admitting: Family

## 2019-02-06 ENCOUNTER — Other Ambulatory Visit: Payer: Medicare Other

## 2019-02-06 ENCOUNTER — Encounter (HOSPITAL_BASED_OUTPATIENT_CLINIC_OR_DEPARTMENT_OTHER): Payer: Self-pay

## 2019-02-06 ENCOUNTER — Ambulatory Visit: Payer: Medicare Other

## 2019-02-06 ENCOUNTER — Emergency Department (HOSPITAL_BASED_OUTPATIENT_CLINIC_OR_DEPARTMENT_OTHER)
Admission: EM | Admit: 2019-02-06 | Discharge: 2019-02-06 | Disposition: A | Discharge status: Home / Self Care | Payer: Medicare Other | Attending: Emergency Medicine | Admitting: Emergency Medicine

## 2019-02-06 ENCOUNTER — Emergency Department (HOSPITAL_BASED_OUTPATIENT_CLINIC_OR_DEPARTMENT_OTHER): Payer: Medicare Other

## 2019-02-06 ENCOUNTER — Other Ambulatory Visit: Payer: Self-pay

## 2019-02-06 DIAGNOSIS — Z87891 Personal history of nicotine dependence: Secondary | ICD-10-CM | POA: Insufficient documentation

## 2019-02-06 DIAGNOSIS — R197 Diarrhea, unspecified: Secondary | ICD-10-CM | POA: Diagnosis not present

## 2019-02-06 DIAGNOSIS — Z79899 Other long term (current) drug therapy: Secondary | ICD-10-CM | POA: Diagnosis not present

## 2019-02-06 DIAGNOSIS — I10 Essential (primary) hypertension: Secondary | ICD-10-CM | POA: Insufficient documentation

## 2019-02-06 DIAGNOSIS — R1030 Lower abdominal pain, unspecified: Secondary | ICD-10-CM | POA: Diagnosis not present

## 2019-02-06 DIAGNOSIS — Z8673 Personal history of transient ischemic attack (TIA), and cerebral infarction without residual deficits: Secondary | ICD-10-CM | POA: Insufficient documentation

## 2019-02-06 DIAGNOSIS — J45909 Unspecified asthma, uncomplicated: Secondary | ICD-10-CM | POA: Insufficient documentation

## 2019-02-06 DIAGNOSIS — N390 Urinary tract infection, site not specified: Secondary | ICD-10-CM

## 2019-02-06 DIAGNOSIS — R112 Nausea with vomiting, unspecified: Secondary | ICD-10-CM | POA: Diagnosis present

## 2019-02-06 LAB — CBC WITH DIFFERENTIAL/PLATELET
Abs Immature Granulocytes: 0.01 10*3/uL (ref 0.00–0.07)
Basophils Absolute: 0 10*3/uL (ref 0.0–0.1)
Basophils Relative: 0 %
Eosinophils Absolute: 0 10*3/uL (ref 0.0–0.5)
Eosinophils Relative: 0 %
HCT: 39.7 % (ref 36.0–46.0)
Hemoglobin: 12.7 g/dL (ref 12.0–15.0)
Immature Granulocytes: 0 %
Lymphocytes Relative: 4 %
Lymphs Abs: 0.2 10*3/uL — ABNORMAL LOW (ref 0.7–4.0)
MCH: 28.9 pg (ref 26.0–34.0)
MCHC: 32 g/dL (ref 30.0–36.0)
MCV: 90.4 fL (ref 80.0–100.0)
Monocytes Absolute: 0.2 10*3/uL (ref 0.1–1.0)
Monocytes Relative: 3 %
Neutro Abs: 6.1 10*3/uL (ref 1.7–7.7)
Neutrophils Relative %: 93 %
Platelets: 166 10*3/uL (ref 150–400)
RBC: 4.39 MIL/uL (ref 3.87–5.11)
RDW: 18.8 % — ABNORMAL HIGH (ref 11.5–15.5)
WBC: 6.5 10*3/uL (ref 4.0–10.5)
nRBC: 0 % (ref 0.0–0.2)

## 2019-02-06 LAB — URINALYSIS, MICROSCOPIC (REFLEX)

## 2019-02-06 LAB — COMPREHENSIVE METABOLIC PANEL
ALT: 23 U/L (ref 0–44)
AST: 29 U/L (ref 15–41)
Albumin: 4.1 g/dL (ref 3.5–5.0)
Alkaline Phosphatase: 96 U/L (ref 38–126)
Anion gap: 12 (ref 5–15)
BUN: 17 mg/dL (ref 8–23)
CO2: 25 mmol/L (ref 22–32)
Calcium: 9.5 mg/dL (ref 8.9–10.3)
Chloride: 106 mmol/L (ref 98–111)
Creatinine, Ser: 1.05 mg/dL — ABNORMAL HIGH (ref 0.44–1.00)
GFR calc Af Amer: >60 mL/min (ref >60)
GFR calc non Af Amer: 56 mL/min — ABNORMAL LOW (ref >60)
Glucose, Bld: 135 mg/dL — ABNORMAL HIGH (ref 70–99)
Potassium: 3.7 mmol/L (ref 3.5–5.1)
Sodium: 143 mmol/L (ref 135–145)
Total Bilirubin: 0.5 mg/dL (ref 0.3–1.2)
Total Protein: 7.6 g/dL (ref 6.5–8.1)

## 2019-02-06 LAB — URINALYSIS, ROUTINE W REFLEX MICROSCOPIC
Bilirubin Urine: NEGATIVE
Glucose, UA: NEGATIVE mg/dL
Hgb urine dipstick: NEGATIVE
Ketones, ur: NEGATIVE mg/dL
Nitrite: NEGATIVE
Protein, ur: NEGATIVE mg/dL
Specific Gravity, Urine: 1.015 (ref 1.005–1.030)
pH: 7 (ref 5.0–8.0)

## 2019-02-06 LAB — LIPASE, BLOOD: Lipase: 23 U/L (ref 11–51)

## 2019-02-06 MED ORDER — ONDANSETRON 4 MG PO TBDP
ORAL_TABLET | ORAL | Status: AC
  Administered 2019-02-06: 4 mg via ORAL
  Filled 2019-02-06: qty 1

## 2019-02-06 MED ORDER — HEPARIN SOD (PORK) LOCK FLUSH 100 UNIT/ML IV SOLN
INTRAVENOUS | Status: AC
  Administered 2019-02-06: 500 [IU]
  Filled 2019-02-06: qty 5

## 2019-02-06 MED ORDER — CEPHALEXIN 500 MG PO CAPS: 500.0000 mg | ORAL_CAPSULE | Freq: Two times a day (BID) | ORAL | 0 refills | Status: AC

## 2019-02-06 MED ORDER — ONDANSETRON 4 MG PO TBDP
4.0000 mg | ORAL_TABLET | Freq: Once | ORAL | Status: AC
  Administered 2019-02-06: 4 mg via ORAL

## 2019-02-06 MED ORDER — SODIUM CHLORIDE 0.9 % IV BOLUS
1000.0000 mL | Freq: Once | INTRAVENOUS | Status: AC
  Administered 2019-02-06: 1000 mL via INTRAVENOUS

## 2019-02-06 MED ORDER — ONDANSETRON HCL 4 MG/2ML IJ SOLN
INTRAMUSCULAR | Status: AC
  Administered 2019-02-06: 4 mg via INTRAVENOUS
  Filled 2019-02-06: qty 2

## 2019-02-06 MED ORDER — MORPHINE SULFATE (PF) 4 MG/ML IV SOLN
4.0000 mg | Freq: Once | INTRAVENOUS | Status: AC
  Administered 2019-02-06: 4 mg via INTRAVENOUS
  Filled 2019-02-06: qty 1

## 2019-02-06 MED ORDER — ONDANSETRON 4 MG PO TBDP: 4.0000 mg | ORAL_TABLET | Freq: Three times a day (TID) | ORAL | 0 refills | Status: DC | PRN

## 2019-02-06 MED ORDER — IOHEXOL 300 MG/ML  SOLN
100.0000 mL | Freq: Once | INTRAMUSCULAR | Status: AC | PRN
  Administered 2019-02-06: 100 mL via INTRAVENOUS

## 2019-02-06 MED ORDER — ONDANSETRON HCL 4 MG/2ML IJ SOLN
4.0000 mg | Freq: Once | INTRAMUSCULAR | Status: AC
  Administered 2019-02-06: 14:00:00 4 mg via INTRAVENOUS

## 2019-02-06 NOTE — ED Provider Notes (Signed)
[facility] Provider Note   CSN: [id number] Arrival date & time: 02/06/19  1210    History   Chief Complaint Chief Complaint  Patient presents with   Emesis    HPI Donna Curry is a 63 y.o. female.     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 Also reports cold sweats. Denies changes in bladder habits, sick contacts. Denies blood in stool or emesis. Reports mild, non productive cough.   Donna Curry was evaluated in Emergency Department on 02/06/2019 for the symptoms described in the history of present illness. She was evaluated in the context of the global COVID-19 pandemic, which necessitated consideration that the patient might be at risk for infection with the SARS-CoV-2 virus that causes COVID-19. Institutional protocols and algorithms that pertain to the evaluation of patients at risk for COVID-19 are in a state of rapid change based on information released by regulatory bodies including the CDC and federal and state organizations. These policies and algorithms were followed during the patient's care in the ED.      Past Medical History:  Diagnosis Date   Anemia    Asthma    Dyspnea    with exertion    Family history of adverse reaction to anesthesia    brother wakes up and dont know who he is and sister has n/v   Goals of care, counseling/discussion 02/09/2018   History of blood transfusion    Hypertension    Iron deficiency anemia due to chronic blood loss 03/01/2018   Measles as a child   Mumps as a child   TIA (transient ischemic attack)    Tobacco abuse     Patient Active Problem List   Diagnosis Date Noted   Protein-calorie malnutrition, severe 09/03/2018   Acute respiratory distress 08/31/2018   Acute respiratory failure with hypoxia ([place])  08/31/2018   HCAP (healthcare-associated pneumonia) 08/06/2018   Emphysema of lung ([place]) 08/06/2018   Acute on chronic respiratory failure with hypoxia ([place]) 08/06/2018   Iron deficiency anemia due to chronic blood loss 03/01/2018   Goals of care, counseling/discussion 02/09/2018   Bladder cancer ([place]) 12/13/2017   Hypertensive cardiomyopathy, 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[place]) 04/29/2016   Hypokalemia 04/02/2016   Protein-energy malnutrition ([place]) 04/02/2016   Hypertensive emergency 04/01/2016   Dizziness and giddiness 04/12/2013   Palpitations 03/27/2013   Weight loss 06/08/2012   Degenerative disc disease, lumbar 01/30/2012   Adrenal mass ([place]) 01/30/2012   Hypercalcemia 01/30/2012   Hyperproteinemia 01/30/2012   Borderline diabetes 01/30/2012   Normocytic anemia 03/11/2011   TOBACCO ABUSE 08/31/2009   Essential hypertension 08/31/2009    Past Surgical History:  Procedure Laterality Date   CYSTOSCOPY W/ URETERAL STENT PLACEMENT Right 12/13/2017   Procedure: CYSTOSCOPY WITH RIGHT RETROGRADE PYELOGRAM/URETERAL RIGHT STENT PLACEMENT;  Surgeon: [name], MD;  Location: [facility];  Service: Urology;  Laterality: Right;   IR IMAGING GUIDED PORT INSERTION  02/23/2018   TRANSURETHRAL RESECTION OF BLADDER TUMOR N/A 12/13/2017   Procedure: TRANSURETHRAL RESECTION OF BLADDER TUMOR (TURBT);  Surgeon: [name], MD;  Location: [facility];  Service: Urology;  Laterality: N/A;   TRANSURETHRAL RESECTION OF BLADDER TUMOR N/A 10/03/2018   Procedure: TRANSURETHRAL RESECTION OF BLADDER TUMOR (TURBT);  Surgeon: [name], MD;  Location: [facility];  Service: Urology;  Laterality: N/A;   TRANSURETHRAL  RESECTION OF BLADDER TUMOR WITH GYRUS (TURBT-GYRUS)     [name] 12-13-17   tubes tied     uterine ablation     about 2005     OB History   No obstetric history on file.      Home Medications    Prior to Admission medications   Medication Sig Start Date End  Date Taking? Authorizing Provider  acetaminophen (TYLENOL) 500 MG tablet Take 1,000 mg by mouth every 8 (eight) hours as needed for moderate pain.    [provider]  albuterol (PROVENTIL) (2.5 MG/3ML) 0.083% nebulizer solution Take 3 mLs (2.5 mg total) by nebulization every 4 (four) hours as needed for wheezing or shortness of breath. 09/03/18   Patrecia Pour, MD  amLODipine (NORVASC) 10 MG tablet Take 1 tablet by mouth once daily 01/30/19   [name], NP  aspirin EC 81 MG tablet Take 81 mg by mouth daily.     [provider]  budesonide-formoterol (SYMBICORT) 160-4.5 MCG/ACT inhaler Inhale 2 puffs into the lungs 2 (two) times daily. 08/08/18   [name], MD  cephALEXin (KEFLEX) 500 MG capsule Take 1 capsule (500 mg total) by mouth 2 (two) times daily for 5 days. 02/06/19 02/11/19  Tacy Learn, PA-C  dronabinol (MARINOL) 2.5 MG capsule Take 2 capsules (5 mg total) by mouth 2 (two) times daily before a meal. 01/21/19   [place], Holli Humbles, NP  lidocaine-prilocaine (EMLA) cream Apply to affected area once 11/06/18   [name], MD  lidocaine-prilocaine (EMLA) cream Apply 1 application topically as needed. 12/05/18   [name], MD  megestrol (MEGACE) 20 MG tablet Take 1 tablet (20 mg total) by mouth daily. 01/16/19   [name], MD  methylPREDNISolone (MEDROL DOSEPAK) 4 MG TBPK tablet Please take per package instructions. 01/14/19   [name], NP  nitrofurantoin (MACRODANTIN) 100 MG capsule Take 100 mg by mouth 2 (two) times daily.    [provider]  ondansetron (ZOFRAN ODT) 4 MG disintegrating tablet Take 1 tablet (4 mg total) by mouth every 8 (eight) hours as needed for nausea or vomiting. 02/06/19   Tacy Learn, PA-C  ondansetron (ZOFRAN) 8 MG tablet Take 1 tablet (8 mg total) by mouth every 8 (eight) hours as needed for nausea or vomiting. 01/16/19   [name], MD  oxyCODONE (OXY IR/ROXICODONE) 5 MG immediate release  tablet Take 1 tablet (5 mg total) by mouth every 6 (six) hours as needed for severe pain. 01/16/19   [name], MD  prochlorperazine (COMPAZINE) 10 MG tablet Take 1 tablet (10 mg total) by mouth every 6 (six) hours as needed for nausea or vomiting. 01/16/19   [name], MD  Vitamin D, Ergocalciferol, (DRISDOL) 1.25 MG (50000 UT) CAPS capsule Take 1 capsule by mouth once a week 12/26/18   [name], MD    Family History Family History  Problem Relation Age of Onset   Hypertension Mother    Diabetes Father    Hypertension Brother    HIV Brother     Social History Social History   Tobacco Use   Smoking status: Former Smoker    Packs/day: 0.25    Types: Cigarettes   Smokeless tobacco: Never Used   Tobacco comment: Quit x 6 weeks.  Substance Use Topics   Alcohol use: No    Alcohol/week: 0.0 standard drinks   Drug use: Never     Allergies  Patient has no known allergies.   Review of Systems Review of Systems  Constitutional: Positive for chills and diaphoresis.  Respiratory: Positive for cough. Negative for shortness of breath.   Cardiovascular: Negative for chest pain.  Gastrointestinal: Positive for abdominal pain, diarrhea, nausea and vomiting. Negative for anal bleeding, blood in stool and constipation.  Genitourinary: Negative for dysuria, frequency and urgency.  Musculoskeletal: Negative for arthralgias and myalgias.  Skin: Negative for rash and wound.  Allergic/Immunologic: Positive for immunocompromised state.  Neurological: Negative for dizziness and weakness.  Hematological: Negative for adenopathy. Does not bruise/bleed easily.  Psychiatric/Behavioral: Negative for confusion.  All other systems reviewed and are negative.    Physical Exam Updated Vital Signs BP (!) 178/107 (BP Location: Left Arm)    Pulse 89    Temp 98.1 F (36.7 C) (Oral)    Resp 20    Ht 5\' 5"  (1.651 m)    Wt 35.4 kg    BMI 12.98 kg/m   Physical Exam Vitals  signs and nursing note reviewed.  Constitutional:      General: She is not in acute distress.    Appearance: She is well-developed. She is not diaphoretic.  HENT:     Head: Normocephalic and atraumatic.  Cardiovascular:     Rate and Rhythm: Normal rate and regular rhythm.     Pulses: Normal pulses.     Heart sounds: Normal heart sounds.  Pulmonary:     Effort: Pulmonary effort is normal.     Breath sounds: Normal breath sounds.  Abdominal:     General: Abdomen is flat. There is no distension.     Tenderness: There is abdominal tenderness in the right lower quadrant, suprapubic area and left lower quadrant. There is no right CVA tenderness, left CVA tenderness or guarding.  Musculoskeletal:     Right lower leg: No edema.     Left lower leg: No edema.  Skin:    General: Skin is warm and dry.     Findings: No erythema or rash.  Neurological:     Mental Status: She is alert and oriented to person, place, and time.  Psychiatric:        Behavior: Behavior normal.      ED Treatments / Results  Labs (all labs ordered are listed, but only abnormal results are displayed) Labs Reviewed  CBC WITH DIFFERENTIAL/PLATELET - Abnormal; Notable for the following components:      Result Value   RDW 18.8 (*)    Lymphs Abs 0.2 (*)    All other components within normal limits  COMPREHENSIVE METABOLIC PANEL - Abnormal; Notable for the following components:   Glucose, Bld 135 (*)    Creatinine, Ser 1.05 (*)    GFR calc non Af Amer 56 (*)    All other components within normal limits  URINALYSIS, ROUTINE W REFLEX MICROSCOPIC - Abnormal; Notable for the following components:   Leukocytes,Ua TRACE (*)    All other components within normal limits  URINALYSIS, MICROSCOPIC (REFLEX) - Abnormal; Notable for the following components:   Bacteria, UA RARE (*)    All other components within normal limits  URINE CULTURE  LIPASE, BLOOD    EKG None  Radiology Ct Abdomen Pelvis W Contrast  Result  Date: 02/06/2019 CLINICAL DATA:  Nausea, vomiting and diarrhea for 2 days. History of hypertension and asthma. History of bladder carcinoma. EXAM: CT ABDOMEN AND PELVIS WITH CONTRAST TECHNIQUE: Multidetector CT imaging of the abdomen and pelvis was performed using the standard  protocol following bolus administration of intravenous contrast. CONTRAST:  140mL OMNIPAQUE IOHEXOL 300 MG/ML  SOLN COMPARISON:  PET-CT, 10/19/2018.  Abdomen pelvis CT, 09/03/2018. FINDINGS: Lower chest: Coarse reticular type opacities in the left lower lobe that are new from the prior CT. This may reflect atelectasis or interval scarring. Consider infection if there are consistent clinical findings. Mild chronic scarring or atelectasis is noted the anterolateral right lung base. Heart is normal in size. Hepatobiliary: 3-4 mm low-density lesion near the dome of the lateral segment of the left lobe. 4 mm low-density lesion in the inferior right lobe and tiny low-density lesion in the posterior aspect of posterior segment the right lobe. These are likely cysts to being evident on the prior CT. No other liver abnormality. Normal gallbladder. No bile duct dilation. Pancreas: Unremarkable. No pancreatic ductal dilatation or surrounding inflammatory changes. Spleen: Normal in size without focal abnormality. Adrenals/Urinary Tract: Bilateral adrenal gland enlargement with heterogeneous attenuation, right measuring approximately 3.0 x 1.5 cm, left 3.0 x 1.1 cm, both stable. These were not hypermetabolic on PET and are most consistent with adenomatous hyperplasia. Mild renal cortical thinning. Tiny low-density lesions noted along the cortices of both kidneys consistent with cysts. No suspicious masses, no stones and no hydronephrosis. Ureters are difficult to visualize given the lack of retroperitoneal fat. No visualized ureteral dilation. No stone. Bladder is distended. There is irregular enhancing lesion within the right anterior inferior bladder  that contains punctate and this may arise from anterior inferior bladder wall. The bladder wall masses noted on the prior CT are no longer evident. There is mild right-sided bladder wall thickening. Linear areas increased attenuation or enhancement Stomach/Bowel: Stomach is unremarkable. Small bowel and colon are normal in caliber. No wall thickening. No convincing inflammation. Appendix not discretely seen, but no evidence of appendicitis. Vascular/Lymphatic: Aortic atherosclerosis and ectasia. Maximum AP diameter of the infrarenal aorta 2.0 cm. No dissection. No enlarged lymph nodes. Reproductive: Uterus and bilateral adnexa are unremarkable. Other: There is a paucity of peritoneal, retroperitoneal and subcutaneous fat. No abdominal wall hernia. No ascites. Musculoskeletal: No fracture or acute finding. No osteoblastic or osteolytic lesions. Stable degenerative changes of the lumbar spine. IMPRESSION: 1. No acute findings within the abdomen or pelvis. No evidence of bowel inflammation or obstruction. 2. There is opacity within the left upper lobe abutting the oblique fissure, which is new from the prior CT. Consider pneumonia if there are consistent clinical findings. 3. Bilateral adrenal gland enlargement consistent with adenomatous hyperplasia stable from the prior PET-CT. 4. Aortic ectasia, without aneurysm, and atherosclerosis stable from the prior exams. 5. Renal cortical thinning.  Tiny cortical cysts. 6. The bladder masses noted on the CT from 09/03/2018 are no longer evident. There is mild thickening along the right bladder wall. There is a small heterogeneous lesion that lies along the anterior inferior aspect of the bladder. This could arise from the wall, which we would the concerning for neoplasm, or may reflect material within the bladder such as thrombus. The latter is suspected. Electronically Signed   By: Lajean Manes M.D.   On: 02/06/2019 16:51    Procedures Procedures (including critical  care time)  Medications Ordered in ED Medications  ondansetron (ZOFRAN-ODT) disintegrating tablet 4 mg (4 mg Oral Given 02/06/19 1235)  morphine 4 MG/ML injection 4 mg (4 mg Intravenous Given 02/06/19 1400)  sodium chloride 0.9 % bolus 1,000 mL (0 mLs Intravenous Stopped 02/06/19 1609)  ondansetron (ZOFRAN) injection 4 mg (4 mg Intravenous Given 02/06/19 1400)  iohexol (OMNIPAQUE) 300 MG/ML solution 100 mL (100 mLs Intravenous Contrast Given 02/06/19 1615)  heparin lock flush 100 UNIT/ML injection (500 Units  Given 02/06/19 1744)     Initial Impression / Assessment and Plan / ED Course  I have reviewed the triage vital signs and the nursing notes.  Pertinent labs & imaging results that were available during my care of the patient were reviewed by me and considered in my medical decision making (see chart for details).  Clinical Course as of Feb 05 1801  Wed Feb 06, 6968  [id number] 63 year old female with history of advanced/metastatic high-grade urothelial carcinoma presents with complaint of vomiting and diarrhea with lower abdominal pain.  Denies blood in her stools or emesis.  On exam patient has mild lower abdominal tenderness palpation, lung sounds are clear.  Patient does note an occasional cough with clear sputum.  Review of lab work shows CBC with normal white blood cell count, CMP without significant findings, urinalysis with possible UTI, trace leukocytes with 6-10 white cells and rare bacteria, sent for culture.  Patient will be started on Keflex for UTI, given Zofran ODT for her vomiting.  Patient had a CT scan while in the ER.  Patient does not appear to have pneumonia, CT without any other emergent findings today however recommend patient contact her oncologist for their review.  Patient plans to call her oncologist in the morning.  Patient is tolerating p.o. fluids, feels somewhat better and is ready for discharge home.  Patient should recheck with her PCP this week, return to ER for new or  worsening symptoms.   [LM]    Clinical Course User Index [LM] Tacy Learn, PA-C      Final Clinical Impressions(s) / ED Diagnoses   Final diagnoses:  Nausea vomiting and diarrhea  Urinary tract infection in female    ED Discharge Orders         Ordered    ondansetron (ZOFRAN ODT) 4 MG disintegrating tablet  Every 8 hours PRN     02/06/19 1717    cephALEXin (KEFLEX) 500 MG capsule  2 times daily     02/06/19 1718           Tacy Learn, PA-C 02/06/19 1802    Hayden Rasmussen, MD 02/07/19 1044

## 2019-02-06 NOTE — ED Triage Notes (Signed)
C/o n/v/d day 2- to triage in w/c-NAD

## 2019-02-06 NOTE — ED Notes (Signed)
amb to drink water w/o difficulty or vomiting

## 2019-02-06 NOTE — ED Notes (Signed)
ED Provider at bedside. 

## 2019-02-06 NOTE — Discharge Instructions (Signed)
Call your oncologist's office to review your CT scan from today. Recheck with your doctor, return to ER for new or worsening symptoms. Take Zofran as needed as prescribed for nausea and vomiting.  Take Keflex as prescribed for urinary tract infection.  Complete the full course.

## 2019-02-07 LAB — URINE CULTURE

## 2019-02-11 ENCOUNTER — Ambulatory Visit (INDEPENDENT_AMBULATORY_CARE_PROVIDER_SITE_OTHER): Payer: Medicare Other | Admitting: Family

## 2019-02-11 ENCOUNTER — Telehealth: Payer: Self-pay | Admitting: Family

## 2019-02-11 ENCOUNTER — Other Ambulatory Visit: Payer: Self-pay

## 2019-02-11 ENCOUNTER — Ambulatory Visit (HOSPITAL_COMMUNITY)
Admission: RE | Admit: 2019-02-11 | Discharge: 2019-02-11 | Disposition: A | Discharge status: Home / Self Care | Payer: Medicare Other | Source: Ambulatory Visit | Attending: Hematology & Oncology | Admitting: Hematology & Oncology

## 2019-02-11 DIAGNOSIS — C673 Malignant neoplasm of anterior wall of bladder: Secondary | ICD-10-CM | POA: Insufficient documentation

## 2019-02-11 DIAGNOSIS — I10 Essential (primary) hypertension: Secondary | ICD-10-CM | POA: Diagnosis not present

## 2019-02-11 DIAGNOSIS — N3001 Acute cystitis with hematuria: Secondary | ICD-10-CM | POA: Diagnosis not present

## 2019-02-11 DIAGNOSIS — I700 Atherosclerosis of aorta: Secondary | ICD-10-CM | POA: Diagnosis not present

## 2019-02-11 DIAGNOSIS — J439 Emphysema, unspecified: Secondary | ICD-10-CM | POA: Insufficient documentation

## 2019-02-11 DIAGNOSIS — I2510 Atherosclerotic heart disease of native coronary artery without angina pectoris: Secondary | ICD-10-CM | POA: Diagnosis not present

## 2019-02-11 LAB — GLUCOSE, CAPILLARY: Glucose-Capillary: 93 mg/dL (ref 70–99)

## 2019-02-11 MED ORDER — FLUDEOXYGLUCOSE F - 18 (FDG) INJECTION
5.5900 | Freq: Once | INTRAVENOUS | Status: AC | PRN
  Administered 2019-02-11: 5.59 via INTRAVENOUS

## 2019-02-11 MED ORDER — CITALOPRAM HYDROBROMIDE 10 MG PO TABS: 10.0000 mg | ORAL_TABLET | Freq: Every day | ORAL | 2 refills | Status: DC

## 2019-02-11 NOTE — Telephone Encounter (Signed)
Return in about 6 weeks (around 03/25/2019) for follow up visit. Left msg for pt to callback and make follow up appt

## 2019-02-11 NOTE — Progress Notes (Signed)
Virtual Visit via Telephone Note  I connected with Donna Curry on 02/11/19 at  8:00 AM EDT by telephone and verified that I am speaking with the correct person using two identifiers.  Location: Patient: home Provider: home   I discussed the limitations, risks, security and privacy concerns of performing an evaluation and management service by telephone and the availability of in person appointments. I also discussed with the patient that there may be a patient responsible charge related to this service. The patient expressed understanding and agreed to proceed.   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  She presented to the emergency department on February 06, 2019.  Chief complaint at time of her admission included nausea vomiting diarrhea and lower abdominal pain.  She was started on Keflex for possible urinary tract infection and Zofran as needed for vomiting.  At the time of her ER visit her blood pressure was elevated at 178/107. She notes that she did not take her bp meds that day due to nausea and vomiting. Denies dysuria.  She does note some night sweats.   Review of urine culture performed in the emergency department noted multiple species. She reports resolution of her Gi symptoms since her visit to the ED.      BP Readings from Last 3 Encounters:  02/06/19 (!) 178/107  01/16/19 (!) 140/92  12/26/18 (!) 157/93   Wt Readings from Last 3 Encounters:  02/06/19 78 lb (35.4 kg)  01/16/19 72 lb (32.7 kg)  12/26/18 73 lb (33.1 kg)     Observations/Objective: Past Medical History:  Diagnosis Date  . Anemia   . Asthma   . Dyspnea    with exertion   . Family history of adverse reaction to anesthesia    brother wakes up and dont know who he is and sister has n/v  . Goals of care, counseling/discussion 02/09/2018  . History of  blood transfusion   . Hypertension   . Iron deficiency anemia due to chronic blood loss 03/01/2018  . Measles as a child  . Mumps as a child  . TIA (transient ischemic attack)   . Tobacco abuse      Social History   Socioeconomic History  . Marital status: Single    Spouse name: Not on file  . Number of children: Not on file  . Years of education: Not on file  . Highest education level: Not on file  Occupational History  . Not on file  Social Needs  . Financial resource strain: Not on file  . Food insecurity    Worry: Not on file    Inability: Not on file  . Transportation needs    Medical: Not on file    Non-medical: Not on file  Tobacco Use  . Smoking status: Former Smoker    Packs/day: 0.25    Types: Cigarettes  . Smokeless tobacco: Never Used  . Tobacco comment: Quit x 6 weeks.  Substance and Sexual Activity  . Alcohol use: No    Alcohol/week: 0.0 standard drinks  . Drug use: Never  . Sexual activity: Not Currently  Lifestyle  . Physical activity    Days per week: Not on file    Minutes per session: Not on file  . Stress: Not on file  Relationships  . Social connections    Talks on phone: Not on file  Gets together: Not on file    Attends religious service: Not on file    Active member of club or organization: Not on file    Attends meetings of clubs or organizations: Not on file    Relationship status: Not on file  . Intimate partner violence    Fear of current or ex partner: Not on file    Emotionally abused: Not on file    Physically abused: Not on file    Forced sexual activity: Not on file  Other Topics Concern  . Not on file  Social History Narrative   Denies hx of drug use   Single   1 daughter age 73 lives with daughter and grandson who is 26.   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    Past Surgical History:  Procedure Laterality Date  . CYSTOSCOPY W/ URETERAL STENT PLACEMENT Right 12/13/2017    Procedure: CYSTOSCOPY WITH RIGHT RETROGRADE PYELOGRAM/URETERAL RIGHT STENT PLACEMENT;  Surgeon: [name], MD;  Location: [facility];  Service: Urology;  Laterality: Right;  . IR IMAGING GUIDED PORT INSERTION  02/23/2018  . TRANSURETHRAL RESECTION OF BLADDER TUMOR N/A 12/13/2017   Procedure: TRANSURETHRAL RESECTION OF BLADDER TUMOR (TURBT);  Surgeon: [name], MD;  Location: [facility];  Service: Urology;  Laterality: N/A;  . TRANSURETHRAL RESECTION OF BLADDER TUMOR N/A 10/03/2018   Procedure: TRANSURETHRAL RESECTION OF BLADDER TUMOR (TURBT);  Surgeon: [name], MD;  Location: [facility];  Service: Urology;  Laterality: N/A;  . TRANSURETHRAL RESECTION OF BLADDER TUMOR WITH GYRUS (TURBT-GYRUS)     [name] 12-13-17  . tubes tied    . uterine ablation     about 2005    Family History  Problem Relation Age of Onset  . Hypertension Mother   . Diabetes Father   . Hypertension Brother   . HIV Brother     No Known Allergies  Current Outpatient Medications on File Prior to Visit  Medication Sig Dispense Refill  . acetaminophen (TYLENOL) 500 MG tablet Take 1,000 mg by mouth every 8 (eight) hours as needed for moderate pain.    Marland Kitchen albuterol (PROVENTIL) (2.5 MG/3ML) 0.083% nebulizer solution Take 3 mLs (2.5 mg total) by nebulization every 4 (four) hours as needed for wheezing or shortness of breath. 75 mL 0  . amLODipine (NORVASC) 10 MG tablet Take 1 tablet by mouth once daily 90 tablet 0  . aspirin EC 81 MG tablet Take 81 mg by mouth daily.     . budesonide-formoterol (SYMBICORT) 160-4.5 MCG/ACT inhaler Inhale 2 puffs into the lungs 2 (two) times daily. 1 Inhaler 1  . dronabinol (MARINOL) 2.5 MG capsule Take 2 capsules (5 mg total) by mouth 2 (two) times daily before a meal. 60 capsule 2  . lidocaine-prilocaine (EMLA) cream Apply to affected area once 30 g 3  . lidocaine-prilocaine (EMLA) cream Apply 1 application topically as needed. 30 g 0  . megestrol (MEGACE) 20 MG tablet Take 1  tablet (20 mg total) by mouth daily. 30 tablet 0  . methylPREDNISolone (MEDROL DOSEPAK) 4 MG TBPK tablet Please take per package instructions. 21 tablet 0  . nitrofurantoin (MACRODANTIN) 100 MG capsule Take 100 mg by mouth 2 (two) times daily.    . ondansetron (ZOFRAN ODT) 4 MG disintegrating tablet Take 1 tablet (4 mg total) by mouth every 8 (eight) hours as needed for nausea or vomiting. 12 tablet 0  . ondansetron (ZOFRAN)  8 MG tablet Take 1 tablet (8 mg total) by mouth every 8 (eight) hours as needed for nausea or vomiting. 20 tablet 0  . oxyCODONE (OXY IR/ROXICODONE) 5 MG immediate release tablet Take 1 tablet (5 mg total) by mouth every 6 (six) hours as needed for severe pain. 60 tablet 0  . prochlorperazine (COMPAZINE) 10 MG tablet Take 1 tablet (10 mg total) by mouth every 6 (six) hours as needed for nausea or vomiting. 30 tablet 0  . Vitamin D, Ergocalciferol, (DRISDOL) 1.25 MG (50000 UT) CAPS capsule Take 1 capsule by mouth once a week 12 capsule 0   No current facility-administered medications on file prior to visit.     There were no vitals taken for this visit.   Gen: Awake, alert, no acute distress Resp: Breathing is even and non-labored Psych: calm/pleasant demeanor Neuro: Alert and Oriented x 3, + facial symmetry, speech is clear.   Assessment and Plan:  UTI- clinically resolved following abx.  HTN- bp elevated in the ED, however she had not taken her meds that day. Advised pt to continue her meds and we will plan to repeat her blood pressure in the office in 6 weeks.   Metastatic bladder cancer- she continues treatment with oncology.   Follow Up Instructions:    I discussed the assessment and treatment plan with the patient. The patient was provided an opportunity to ask questions and all were answered. The patient agreed with the plan and demonstrated an understanding of the instructions.   The patient was advised to call back or seek an in-person evaluation if the  symptoms worsen or if the condition fails to improve as anticipated.  I provided 11  minutes of non-face-to-face time during this encounter.   Nance Pear, NP

## 2019-02-13 ENCOUNTER — Inpatient Hospital Stay: Payer: Medicare Other | Attending: Hematology & Oncology | Admitting: Hematology & Oncology

## 2019-02-13 ENCOUNTER — Inpatient Hospital Stay: Payer: Medicare Other

## 2019-02-26 ENCOUNTER — Other Ambulatory Visit: Payer: Self-pay | Admitting: Family

## 2019-02-27 ENCOUNTER — Other Ambulatory Visit: Payer: Medicare Other

## 2019-02-27 ENCOUNTER — Ambulatory Visit: Payer: Medicare Other

## 2019-02-27 ENCOUNTER — Ambulatory Visit: Payer: Medicare Other | Admitting: Hematology & Oncology

## 2019-03-01 ENCOUNTER — Other Ambulatory Visit: Payer: Self-pay | Admitting: *Deleted

## 2019-03-01 ENCOUNTER — Telehealth: Payer: Self-pay | Admitting: Family

## 2019-03-01 DIAGNOSIS — C670 Malignant neoplasm of trigone of bladder: Secondary | ICD-10-CM

## 2019-03-01 MED ORDER — OXYCODONE HCL 5 MG PO TABS: 5.0000 mg | ORAL_TABLET | Freq: Four times a day (QID) | ORAL | 0 refills | Status: DC | PRN

## 2019-03-01 NOTE — Telephone Encounter (Signed)
Please advise 

## 2019-03-01 NOTE — Telephone Encounter (Signed)
Pt stated that she war prescribed citalopram (CELEXA) 10 MG tablet which is for depression. She stated she doesn't need depression medication but rx for night sweats. Please advise. Requesting callback.

## 2019-03-04 NOTE — Telephone Encounter (Signed)
Patient notified that med can be used for night sweats.    She also wanted a refill on symbicort and albuterol inhaler.  Saw where you did not fill last.  I also asked does she see pulmonology, Dr. Lake Bells and she stated no.  Can you refill these for her?

## 2019-03-04 NOTE — Telephone Encounter (Signed)
Please advise pt that citalopram can also help with night sweats. Let me know if it does not help.

## 2019-03-04 NOTE — Telephone Encounter (Signed)
Yes please

## 2019-03-05 ENCOUNTER — Other Ambulatory Visit: Payer: Self-pay

## 2019-03-05 MED ORDER — BUDESONIDE-FORMOTEROL FUMARATE 160-4.5 MCG/ACT IN AERO: 2.0000 | INHALATION_SPRAY | Freq: Two times a day (BID) | RESPIRATORY_TRACT | 1 refills | Status: DC

## 2019-03-05 MED ORDER — SYMBICORT 160-4.5 MCG/ACT IN AERO: 2.0000 | INHALATION_SPRAY | Freq: Two times a day (BID) | RESPIRATORY_TRACT | 1 refills | Status: DC

## 2019-03-05 MED ORDER — ALBUTEROL SULFATE HFA 108 (90 BASE) MCG/ACT IN AERS: 2.0000 | INHALATION_SPRAY | Freq: Four times a day (QID) | RESPIRATORY_TRACT | 1 refills | Status: DC | PRN

## 2019-03-05 NOTE — Telephone Encounter (Signed)
Patient notified rxs have been sent in.

## 2019-03-06 ENCOUNTER — Encounter: Payer: Self-pay | Admitting: Hematology & Oncology

## 2019-03-06 ENCOUNTER — Other Ambulatory Visit: Payer: Self-pay

## 2019-03-06 ENCOUNTER — Inpatient Hospital Stay: Payer: Medicare Other

## 2019-03-06 ENCOUNTER — Telehealth: Payer: Self-pay | Admitting: Hematology & Oncology

## 2019-03-06 ENCOUNTER — Inpatient Hospital Stay: Payer: Medicare Other | Attending: Hematology & Oncology | Admitting: Hematology & Oncology

## 2019-03-06 VITALS — BP 148/86 | HR 98 | Temp 97.5°F | Resp 17 | Wt 71.0 lb

## 2019-03-06 DIAGNOSIS — C670 Malignant neoplasm of trigone of bladder: Secondary | ICD-10-CM | POA: Diagnosis not present

## 2019-03-06 DIAGNOSIS — C671 Malignant neoplasm of dome of bladder: Secondary | ICD-10-CM

## 2019-03-06 DIAGNOSIS — C779 Secondary and unspecified malignant neoplasm of lymph node, unspecified: Secondary | ICD-10-CM | POA: Diagnosis not present

## 2019-03-06 DIAGNOSIS — Z5112 Encounter for antineoplastic immunotherapy: Secondary | ICD-10-CM | POA: Diagnosis present

## 2019-03-06 DIAGNOSIS — C672 Malignant neoplasm of lateral wall of bladder: Secondary | ICD-10-CM

## 2019-03-06 DIAGNOSIS — C673 Malignant neoplasm of anterior wall of bladder: Secondary | ICD-10-CM

## 2019-03-06 DIAGNOSIS — Z79899 Other long term (current) drug therapy: Secondary | ICD-10-CM | POA: Insufficient documentation

## 2019-03-06 DIAGNOSIS — D500 Iron deficiency anemia secondary to blood loss (chronic): Secondary | ICD-10-CM | POA: Insufficient documentation

## 2019-03-06 LAB — CBC WITH DIFFERENTIAL (CANCER CENTER ONLY)
Abs Immature Granulocytes: 0 10*3/uL (ref 0.00–0.07)
Basophils Absolute: 0 10*3/uL (ref 0.0–0.1)
Basophils Relative: 0 %
Eosinophils Absolute: 0.1 10*3/uL (ref 0.0–0.5)
Eosinophils Relative: 1 %
HCT: 39.9 % (ref 36.0–46.0)
Hemoglobin: 13.1 g/dL (ref 12.0–15.0)
Immature Granulocytes: 0 %
Lymphocytes Relative: 20 %
Lymphs Abs: 0.9 10*3/uL (ref 0.7–4.0)
MCH: 30.6 pg (ref 26.0–34.0)
MCHC: 32.8 g/dL (ref 30.0–36.0)
MCV: 93.2 fL (ref 80.0–100.0)
Monocytes Absolute: 0.4 10*3/uL (ref 0.1–1.0)
Monocytes Relative: 9 %
Neutro Abs: 3.1 10*3/uL (ref 1.7–7.7)
Neutrophils Relative %: 70 %
Platelet Count: 200 10*3/uL (ref 150–400)
RBC: 4.28 MIL/uL (ref 3.87–5.11)
RDW: 18.1 % — ABNORMAL HIGH (ref 11.5–15.5)
WBC Count: 4.5 10*3/uL (ref 4.0–10.5)
nRBC: 0 % (ref 0.0–0.2)

## 2019-03-06 LAB — CMP (CANCER CENTER ONLY)
ALT: 10 U/L (ref 0–44)
AST: 12 U/L — ABNORMAL LOW (ref 15–41)
Albumin: 3.9 g/dL (ref 3.5–5.0)
Alkaline Phosphatase: 101 U/L (ref 38–126)
Anion gap: 8 (ref 5–15)
BUN: 11 mg/dL (ref 8–23)
CO2: 29 mmol/L (ref 22–32)
Calcium: 9.1 mg/dL (ref 8.9–10.3)
Chloride: 104 mmol/L (ref 98–111)
Creatinine: 0.76 mg/dL (ref 0.44–1.00)
GFR, Est AFR Am: >60 mL/min (ref >60)
GFR, Estimated: >60 mL/min (ref >60)
Glucose, Bld: 90 mg/dL (ref 70–99)
Potassium: 4.1 mmol/L (ref 3.5–5.1)
Sodium: 141 mmol/L (ref 135–145)
Total Bilirubin: 0.3 mg/dL (ref 0.3–1.2)
Total Protein: 6.6 g/dL (ref 6.5–8.1)

## 2019-03-06 MED ORDER — HEPARIN SOD (PORK) LOCK FLUSH 100 UNIT/ML IV SOLN
500.0000 [IU] | Freq: Once | INTRAVENOUS | Status: AC | PRN
  Administered 2019-03-06: 500 [IU]
  Filled 2019-03-06: qty 5

## 2019-03-06 MED ORDER — SODIUM CHLORIDE 0.9 % IV SOLN
Freq: Once | INTRAVENOUS | Status: AC
  Administered 2019-03-06: 12:00:00 via INTRAVENOUS
  Filled 2019-03-06: qty 250

## 2019-03-06 MED ORDER — SODIUM CHLORIDE 0.9 % IV SOLN
400.0000 mg | Freq: Once | INTRAVENOUS | Status: AC
  Administered 2019-03-06: 400 mg via INTRAVENOUS
  Filled 2019-03-06: qty 16

## 2019-03-06 MED ORDER — SODIUM CHLORIDE 0.9% FLUSH
10.0000 mL | INTRAVENOUS | Status: DC | PRN
  Administered 2019-03-06: 10 mL
  Filled 2019-03-06: qty 10

## 2019-03-06 NOTE — Progress Notes (Signed)
Hematology and Oncology Follow Up Visit  Donna Curry [id number] 1956-03-05 63 y.o. 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 6 week --s/p cycle #4-- start on 11/14/2018 -- changed on 03/06/2019   Interim History:  Donna Curry is here today for follow-up.  Her weight is still a problem.  She still is not gaining weight.  On some Marinol.  She says she is taking this.  Lungs are doing okay.  This is always been her biggest problem clinically.  She has had no flareups of her COPD.  We did do a PET scan on her.this was done on 02/11/2019.  Thankfully, the PET scan did not show any obvious metastatic disease.  I am going to switch her pembrolizumab to every 6-week dosing now.  This will make it a lot easier for her.  She has had no problems with bleeding.  There is been no issues with bowels or bladder.  She has had no rashes.  There is been no cough.    Overall, her performance status is ECOG 2.  Medications:  Allergies as of 03/06/2019   No Known Allergies     Medication List       Accurate as of March 06, 2019 11:19 AM. If you have any questions, ask your nurse or doctor.        acetaminophen 500 MG tablet Commonly known as: TYLENOL Take 1,000 mg by mouth every 8 (eight) hours as needed for moderate pain.   albuterol (2.5 MG/3ML) 0.083% nebulizer solution Commonly known as: PROVENTIL Take 3 mLs (2.5 mg total) by nebulization every 4 (four) hours as needed for wheezing or shortness of breath.   albuterol 108 (90 Base) MCG/ACT inhaler Commonly known as: VENTOLIN HFA Inhale 2 puffs into the lungs every 6 (six) hours as needed for wheezing or shortness of breath.   amLODipine 10 MG tablet Commonly known as: NORVASC Take 1 tablet  by mouth once daily   aspirin EC 81 MG tablet Take 81 mg by mouth daily.   citalopram 10 MG tablet Commonly known as: CeleXA Take 1 tablet (10 mg total) by mouth daily.   dronabinol 2.5 MG capsule Commonly known as: MARINOL Take 2 capsules (5 mg total) by mouth 2 (two) times daily before a meal.   lidocaine-prilocaine cream Commonly known as: EMLA Apply to affected area once   lidocaine-prilocaine cream Commonly known as: EMLA Apply 1 application topically as needed.   megestrol 20 MG tablet Commonly known as: MEGACE Take 1 tablet (20 mg total) by mouth daily.   methylPREDNISolone 4 MG Tbpk tablet Commonly known as: MEDROL DOSEPAK Please take per package instructions.   nitrofurantoin 100 MG capsule Commonly known as: MACRODANTIN Take 100 mg by mouth 2 (two) times daily.   ondansetron 4 MG disintegrating tablet Commonly known as: Zofran ODT Take 1 tablet (4 mg total) by mouth every 8 (eight) hours as needed for nausea or vomiting.   ondansetron 8 MG tablet Commonly known as: ZOFRAN Take 1 tablet (8 mg total) by mouth every 8 (eight) hours as needed for nausea or vomiting.   oxyCODONE 5 MG immediate release tablet Commonly known as: Oxy IR/ROXICODONE Take 1 tablet (5 mg total) by mouth every 6 (six) hours as needed for severe pain.   prochlorperazine 10  MG tablet Commonly known as: COMPAZINE Take 1 tablet (10 mg total) by mouth every 6 (six) hours as needed for nausea or vomiting.   Symbicort 160-4.5 MCG/ACT inhaler Generic drug: budesonide-formoterol Inhale 2 puffs into the lungs 2 (two) times daily.   Vitamin D (Ergocalciferol) 1.25 MG (50000 UT) Caps capsule Commonly known as: DRISDOL Take 1 capsule by mouth once a week       Allergies: No Known Allergies  Past Medical History, Surgical history, Social history, and Family History were reviewed and updated.  Review of Systems: Review of Systems  Constitutional: Negative.   HENT: Negative.   Eyes:  Negative.   Respiratory: Positive for wheezing.   Cardiovascular: Negative.   Gastrointestinal: Positive for abdominal pain.  Genitourinary: Negative.   Musculoskeletal: Positive for back pain.  Skin: Negative.   Neurological: Negative.   Endo/Heme/Allergies: Negative.   Psychiatric/Behavioral: Negative.       Physical Exam:  weight is 71 lb (32.2 kg). Her temporal temperature is 97.5 F (36.4 C) (abnormal). Her blood pressure is 148/86 (abnormal) and her pulse is 98. Her respiration is 17 and oxygen saturation is 95%.   Wt Readings from Last 3 Encounters:  03/06/19 71 lb (32.2 kg)  02/06/19 78 lb (35.4 kg)  01/16/19 72 lb (32.7 kg)    Physical Exam Vitals signs reviewed.  HENT:     Head: Normocephalic and atraumatic.  Eyes:     Pupils: Pupils are equal, round, and reactive to light.  Neck:     Musculoskeletal: Normal range of motion.  Cardiovascular:     Rate and Rhythm: Normal rate and regular rhythm.     Heart sounds: Normal heart sounds.  Pulmonary:     Effort: Pulmonary effort is normal.     Breath sounds: Normal breath sounds.  Abdominal:     General: Bowel sounds are normal.     Palpations: Abdomen is soft.  Musculoskeletal: Normal range of motion.        General: No tenderness or deformity.  Lymphadenopathy:     Cervical: No cervical adenopathy.  Skin:    General: Skin is warm and dry.     Findings: No erythema or rash.  Neurological:     Mental Status: She is alert and oriented to person, place, and time.  Psychiatric:        Behavior: Behavior normal.        Thought Content: Thought content normal.        Judgment: Judgment normal.      Lab Results  Component Value Date   WBC 4.5 03/06/2019   HGB 13.1 03/06/2019   HCT 39.9 03/06/2019   MCV 93.2 03/06/2019   PLT 200 03/06/2019   Lab Results  Component Value Date   FERRITIN 10 (L) 12/26/2018   IRON 34 (L) 12/26/2018   TIBC 353 12/26/2018   UIBC 319 12/26/2018   IRONPCTSAT 10 (L)  12/26/2018   Lab Results  Component Value Date   RBC 4.28 03/06/2019   No results found for: KPAFRELGTCHN, LAMBDASER, KAPLAMBRATIO No results found for: IGGSERUM, IGA, IGMSERUM No results found for: Ronnald Ramp, A1GS, A2GS, Violet Baldy, MSPIKE, SPEI   Chemistry      Component Value Date/Time   NA 141 03/06/2019 1000   K 4.1 03/06/2019 1000   CL 104 03/06/2019 1000   CO2 29 03/06/2019 1000   BUN 11 03/06/2019 1000   CREATININE 0.76 03/06/2019 1000   CREATININE 1.10 08/20/2013 1027  Component Value Date/Time   CALCIUM 9.1 03/06/2019 1000   ALKPHOS 101 03/06/2019 1000   AST 12 (L) 03/06/2019 1000   ALT 10 03/06/2019 1000   BILITOT 0.3 03/06/2019 1000       Impression and Plan: Ms. Wiley is a very pleasant 63 yo African American female with 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  Again, we have her on pembrolizumab.  The PET scan is quite reassuring.  I am just grateful that we have not had to use chemotherapy on her.  Again, we will move her treatments to every 6 weeks now.  This will make it a lot easier for her.  I know it is difficult for her to get to the office.  Hopefully, she will not be hospitalized with a lung issue prior to [place] seeing her again.     [name], MD 8/12/202011:19 AM

## 2019-03-06 NOTE — Telephone Encounter (Signed)
Appointments scheduled calendar printed per 8/12 los

## 2019-03-06 NOTE — Patient Instructions (Signed)
Pembrolizumab injection What is this medicine? PEMBROLIZUMAB (pem broe liz ue mab) is a monoclonal antibody. It is used to treat bladder cancer, cervical cancer, endometrial cancer, esophageal cancer, head and neck cancer, hepatocellular cancer, Hodgkin lymphoma, kidney cancer, lymphoma, melanoma, Merkel cell carcinoma, lung cancer, stomach cancer, urothelial cancer, and cancers that have a certain genetic condition. This medicine may be used for other purposes; ask your health care provider or pharmacist if you have questions. COMMON BRAND NAME(S): Keytruda What should I tell my health care provider before I take this medicine? They need to know if you have any of these conditions:  diabetes  immune system problems  inflammatory bowel disease  liver disease  lung or breathing disease  lupus  received or scheduled to receive an organ transplant or a stem-cell transplant that uses donor stem cells  an unusual or allergic reaction to pembrolizumab, other medicines, foods, dyes, or preservatives  pregnant or trying to get pregnant  breast-feeding How should I use this medicine? This medicine is for infusion into a vein. It is given by a health care professional in a hospital or clinic setting. A special MedGuide will be given to you before each treatment. Be sure to read this information carefully each time. Talk to your pediatrician regarding the use of this medicine in children. While this drug may be prescribed for selected conditions, precautions do apply. Overdosage: If you think you have taken too much of this medicine contact a poison control center or emergency room at once. NOTE: This medicine is only for you. Do not share this medicine with others. What if I miss a dose? It is important not to miss your dose. Call your doctor or health care professional if you are unable to keep an appointment. What may interact with this medicine? Interactions have not been studied. Give  your health care provider a list of all the medicines, herbs, non-prescription drugs, or dietary supplements you use. Also tell them if you smoke, drink alcohol, or use illegal drugs. Some items may interact with your medicine. This list may not describe all possible interactions. Give your health care provider a list of all the medicines, herbs, non-prescription drugs, or dietary supplements you use. Also tell them if you smoke, drink alcohol, or use illegal drugs. Some items may interact with your medicine. What should I watch for while using this medicine? Your condition will be monitored carefully while you are receiving this medicine. You may need blood work done while you are taking this medicine. Do not become pregnant while taking this medicine or for 4 months after stopping it. Women should inform their doctor if they wish to become pregnant or think they might be pregnant. There is a potential for serious side effects to an unborn child. Talk to your health care professional or pharmacist for more information. Do not breast-feed an infant while taking this medicine or for 4 months after the last dose. What side effects may I notice from receiving this medicine? Side effects that you should report to your doctor or health care professional as soon as possible:  allergic reactions like skin rash, itching or hives, swelling of the face, lips, or tongue  bloody or black, tarry  breathing problems  changes in vision  chest pain  chills  confusion  constipation  cough  diarrhea  dizziness or feeling faint or lightheaded  fast or irregular heartbeat  fever  flushing  hair loss  joint pain  low blood counts - this  medicine may decrease the number of white blood cells, red blood cells and platelets. You may be at increased risk for infections and bleeding.  muscle pain  muscle weakness  persistent headache  redness, blistering, peeling or loosening of the skin,  including inside the mouth  signs and symptoms of high blood sugar such as dizziness; dry mouth; dry skin; fruity breath; nausea; stomach pain; increased hunger or thirst; increased urination  signs and symptoms of kidney injury like trouble passing urine or change in the amount of urine  signs and symptoms of liver injury like dark urine, light-colored stools, loss of appetite, nausea, right upper belly pain, yellowing of the eyes or skin  sweating  swollen lymph nodes  weight loss Side effects that usually do not require medical attention (report to your doctor or health care professional if they continue or are bothersome):  decreased appetite  muscle pain  tiredness This list may not describe all possible side effects. Call your doctor for medical advice about side effects. You may report side effects to FDA at 1-800-FDA-1088. Where should I keep my medicine? This drug is given in a hospital or clinic and will not be stored at home. NOTE: This sheet is a summary. It may not cover all possible information. If you have questions about this medicine, talk to your doctor, pharmacist, or health care provider.  2020 Elsevier/Gold Standard (2018-08-07 13:46:58)

## 2019-03-06 NOTE — Progress Notes (Signed)
Patient's dose will change to pembrolizumab 400 mg every 6 weeks per [name].

## 2019-03-18 ENCOUNTER — Ambulatory Visit (HOSPITAL_BASED_OUTPATIENT_CLINIC_OR_DEPARTMENT_OTHER): Payer: Medicare Other

## 2019-03-25 ENCOUNTER — Other Ambulatory Visit: Payer: Self-pay

## 2019-03-25 ENCOUNTER — Ambulatory Visit (INDEPENDENT_AMBULATORY_CARE_PROVIDER_SITE_OTHER): Payer: Medicare Other | Admitting: Family

## 2019-03-25 DIAGNOSIS — R634 Abnormal weight loss: Secondary | ICD-10-CM | POA: Diagnosis not present

## 2019-03-25 DIAGNOSIS — I10 Essential (primary) hypertension: Secondary | ICD-10-CM

## 2019-03-25 DIAGNOSIS — F329 Major depressive disorder, single episode, unspecified: Secondary | ICD-10-CM | POA: Diagnosis not present

## 2019-03-25 DIAGNOSIS — J45909 Unspecified asthma, uncomplicated: Secondary | ICD-10-CM

## 2019-03-25 DIAGNOSIS — F32A Depression, unspecified: Secondary | ICD-10-CM

## 2019-03-25 DIAGNOSIS — C679 Malignant neoplasm of bladder, unspecified: Secondary | ICD-10-CM | POA: Diagnosis not present

## 2019-03-25 MED ORDER — CITALOPRAM HYDROBROMIDE 10 MG PO TABS: 10.0000 mg | ORAL_TABLET | Freq: Every day | ORAL | 1 refills | Status: DC

## 2019-03-25 MED ORDER — AMLODIPINE BESYLATE 10 MG PO TABS: 10.0000 mg | ORAL_TABLET | Freq: Every day | ORAL | 0 refills | Status: DC

## 2019-03-25 NOTE — Progress Notes (Signed)
Virtual Visit via Telephone Note  I connected with Donna Curry on 03/25/19 at 10:00 AM EDT by telephone and verified that I am speaking with the correct person using two identifiers.  Location: Patient: home Provider: home   I discussed the limitations, risks, security and privacy concerns of performing an evaluation and management service by telephone and the availability of in person appointments. I also discussed with the patient that there may be a patient responsible charge related to this service. The patient expressed understanding and agreed to proceed.   History of Present Illness:  Patient is a 63 yr old female who presents today for follow up.   HTN- Pt is maintained on amlodipine.    BP Readings from Last 3 Encounters:  03/06/19 (!) 148/86  02/06/19 (!) 178/107  01/16/19 (!) 140/92   Bladder cancer-  She continues to follow with oncology and had a PET scan on 02/11/19 which showed no sign of metastatic disease. She is being treated with pembrolizumab every 6 weeks.  She continues to have issues with her weight despite rx with marinol and megace. She reports that she is drinking ensure and eating.    Wt Readings from Last 3 Encounters:  03/06/19 71 lb (32.2 kg)  02/06/19 78 lb (35.4 kg)  01/16/19 72 lb (32.7 kg)    Depression- maintained on citalopram 10mg . Reports mood is good on this dose.   Observations/Objective:   Gen: Awake, alert, no acute distress Resp: Breathing is even and non-labored Psych: calm/pleasant demeanor Neuro: Alert and Oriented x 3, speech is clear.    Assessment and Plan:  Bladder cancer- management per oncology.  Weight loss- secondary to bladder cancer most recently.  She is advised to contact oncology to request refills of marinol and megace.   Depression- stable on citalopram. Continue same.  HTN- improved. Continue amlodipine.   Asthma- Not taking symbicort regularly. Advised pt to restart symbicort for maintenance  and continue prn albuterol.   Follow Up Instructions:    I discussed the assessment and treatment plan with the patient. The patient was provided an opportunity to ask questions and all were answered. The patient agreed with the plan and demonstrated an understanding of the instructions.   The patient was advised to call back or seek an in-person evaluation if the symptoms worsen or if the condition fails to improve as anticipated.  I provided 11 minutes of non-face-to-face time during this encounter.   Nance Pear, NP

## 2019-03-26 DIAGNOSIS — C678 Malignant neoplasm of overlapping sites of bladder: Secondary | ICD-10-CM | POA: Diagnosis not present

## 2019-03-27 ENCOUNTER — Other Ambulatory Visit: Payer: Self-pay | Admitting: *Deleted

## 2019-03-27 ENCOUNTER — Telehealth: Payer: Self-pay | Admitting: *Deleted

## 2019-03-27 DIAGNOSIS — C670 Malignant neoplasm of trigone of bladder: Secondary | ICD-10-CM

## 2019-03-27 MED ORDER — LIDOCAINE-PRILOCAINE 2.5-2.5 % EX CREA: 1.0000 "application " | TOPICAL_CREAM | CUTANEOUS | 0 refills | Status: DC | PRN

## 2019-03-27 MED ORDER — MEGESTROL ACETATE 20 MG PO TABS: 20.0000 mg | ORAL_TABLET | Freq: Every day | ORAL | 0 refills | Status: DC

## 2019-03-27 MED ORDER — OXYCODONE HCL 5 MG PO TABS: 5.0000 mg | ORAL_TABLET | Freq: Four times a day (QID) | ORAL | 0 refills | Status: DC | PRN

## 2019-03-27 MED ORDER — ONDANSETRON 4 MG PO TBDP: 4.0000 mg | ORAL_TABLET | Freq: Three times a day (TID) | ORAL | 0 refills | Status: DC | PRN

## 2019-03-27 MED ORDER — PROCHLORPERAZINE MALEATE 10 MG PO TABS: 10.0000 mg | ORAL_TABLET | Freq: Four times a day (QID) | ORAL | 0 refills | Status: DC | PRN

## 2019-03-27 NOTE — Telephone Encounter (Signed)
Message received from patient stating that she would like a call back regarding her medications.  Call placed back to patient and patient states that she needs a refill on EMLA cream, Megace, Compazine, Zofran and Oxycodone.  Pt instructed that all refills will be sent in except for Oxycodone which will be sent in on Friday.  Pt appreciative of assistance and has no further questions or concerns at this time.

## 2019-04-16 ENCOUNTER — Other Ambulatory Visit: Payer: Self-pay | Admitting: *Deleted

## 2019-04-16 DIAGNOSIS — C670 Malignant neoplasm of trigone of bladder: Secondary | ICD-10-CM

## 2019-04-16 MED ORDER — OXYCODONE HCL 5 MG PO TABS: 5.0000 mg | ORAL_TABLET | Freq: Four times a day (QID) | ORAL | 0 refills | Status: DC | PRN

## 2019-04-17 ENCOUNTER — Other Ambulatory Visit: Payer: Medicare Other

## 2019-04-17 ENCOUNTER — Ambulatory Visit: Payer: Medicare Other | Admitting: Hematology & Oncology

## 2019-04-17 ENCOUNTER — Ambulatory Visit: Payer: Medicare Other

## 2019-04-25 ENCOUNTER — Other Ambulatory Visit: Payer: Self-pay | Admitting: *Deleted

## 2019-04-25 MED ORDER — ALBUTEROL SULFATE HFA 108 (90 BASE) MCG/ACT IN AERS: 2.0000 | INHALATION_SPRAY | Freq: Four times a day (QID) | RESPIRATORY_TRACT | 2 refills | Status: DC | PRN

## 2019-04-26 ENCOUNTER — Other Ambulatory Visit: Payer: Self-pay | Admitting: *Deleted

## 2019-04-26 DIAGNOSIS — C673 Malignant neoplasm of anterior wall of bladder: Secondary | ICD-10-CM

## 2019-04-26 MED ORDER — DRONABINOL 2.5 MG PO CAPS: 5.0000 mg | ORAL_CAPSULE | Freq: Two times a day (BID) | ORAL | 2 refills | Status: DC

## 2019-04-29 ENCOUNTER — Encounter: Payer: Self-pay | Admitting: Hematology & Oncology

## 2019-04-29 ENCOUNTER — Other Ambulatory Visit: Payer: Self-pay

## 2019-04-29 ENCOUNTER — Inpatient Hospital Stay: Payer: Medicare Other | Attending: Hematology & Oncology

## 2019-04-29 ENCOUNTER — Inpatient Hospital Stay (HOSPITAL_BASED_OUTPATIENT_CLINIC_OR_DEPARTMENT_OTHER): Payer: Medicare Other | Admitting: Hematology & Oncology

## 2019-04-29 ENCOUNTER — Inpatient Hospital Stay: Payer: Medicare Other

## 2019-04-29 ENCOUNTER — Telehealth: Payer: Self-pay | Admitting: Hematology & Oncology

## 2019-04-29 DIAGNOSIS — D500 Iron deficiency anemia secondary to blood loss (chronic): Secondary | ICD-10-CM | POA: Diagnosis not present

## 2019-04-29 DIAGNOSIS — Z5112 Encounter for antineoplastic immunotherapy: Secondary | ICD-10-CM | POA: Insufficient documentation

## 2019-04-29 DIAGNOSIS — C671 Malignant neoplasm of dome of bladder: Secondary | ICD-10-CM

## 2019-04-29 DIAGNOSIS — C672 Malignant neoplasm of lateral wall of bladder: Secondary | ICD-10-CM

## 2019-04-29 DIAGNOSIS — Z79899 Other long term (current) drug therapy: Secondary | ICD-10-CM | POA: Insufficient documentation

## 2019-04-29 DIAGNOSIS — C670 Malignant neoplasm of trigone of bladder: Secondary | ICD-10-CM | POA: Diagnosis not present

## 2019-04-29 LAB — CMP (CANCER CENTER ONLY)
ALT: 10 U/L (ref 0–44)
AST: 14 U/L — ABNORMAL LOW (ref 15–41)
Albumin: 4.2 g/dL (ref 3.5–5.0)
Alkaline Phosphatase: 73 U/L (ref 38–126)
Anion gap: 7 (ref 5–15)
BUN: 11 mg/dL (ref 8–23)
CO2: 26 mmol/L (ref 22–32)
Calcium: 9.4 mg/dL (ref 8.9–10.3)
Chloride: 105 mmol/L (ref 98–111)
Creatinine: 0.81 mg/dL (ref 0.44–1.00)
GFR, Est AFR Am: >60 mL/min (ref >60)
GFR, Estimated: >60 mL/min (ref >60)
Glucose, Bld: 98 mg/dL (ref 70–99)
Potassium: 3.7 mmol/L (ref 3.5–5.1)
Sodium: 138 mmol/L (ref 135–145)
Total Bilirubin: 0.4 mg/dL (ref 0.3–1.2)
Total Protein: 6.9 g/dL (ref 6.5–8.1)

## 2019-04-29 LAB — CBC WITH DIFFERENTIAL (CANCER CENTER ONLY)
Abs Immature Granulocytes: 0.01 10*3/uL (ref 0.00–0.07)
Basophils Absolute: 0 10*3/uL (ref 0.0–0.1)
Basophils Relative: 1 %
Eosinophils Absolute: 0.1 10*3/uL (ref 0.0–0.5)
Eosinophils Relative: 2 %
HCT: 37.7 % (ref 36.0–46.0)
Hemoglobin: 12.7 g/dL (ref 12.0–15.0)
Immature Granulocytes: 0 %
Lymphocytes Relative: 22 %
Lymphs Abs: 0.9 10*3/uL (ref 0.7–4.0)
MCH: 32.3 pg (ref 26.0–34.0)
MCHC: 33.7 g/dL (ref 30.0–36.0)
MCV: 95.9 fL (ref 80.0–100.0)
Monocytes Absolute: 0.4 10*3/uL (ref 0.1–1.0)
Monocytes Relative: 10 %
Neutro Abs: 2.5 10*3/uL (ref 1.7–7.7)
Neutrophils Relative %: 65 %
Platelet Count: 171 10*3/uL (ref 150–400)
RBC: 3.93 MIL/uL (ref 3.87–5.11)
RDW: 13.8 % (ref 11.5–15.5)
WBC Count: 3.8 10*3/uL — ABNORMAL LOW (ref 4.0–10.5)
nRBC: 0 % (ref 0.0–0.2)

## 2019-04-29 MED ORDER — AMLODIPINE BESYLATE 10 MG PO TABS: 10.0000 mg | ORAL_TABLET | Freq: Every day | ORAL | 0 refills | Status: DC

## 2019-04-29 MED ORDER — OXYCODONE HCL 5 MG PO TABS: 5.0000 mg | ORAL_TABLET | Freq: Four times a day (QID) | ORAL | 0 refills | Status: DC | PRN

## 2019-04-29 MED ORDER — SODIUM CHLORIDE 0.9% FLUSH
10.0000 mL | Freq: Once | INTRAVENOUS | Status: AC | PRN
  Administered 2019-04-29: 10 mL
  Filled 2019-04-29: qty 10

## 2019-04-29 MED ORDER — SODIUM CHLORIDE 0.9 % IV SOLN
Freq: Once | INTRAVENOUS | Status: AC
  Administered 2019-04-29: 13:00:00 via INTRAVENOUS
  Filled 2019-04-29: qty 250

## 2019-04-29 MED ORDER — SODIUM CHLORIDE 0.9 % IV SOLN
400.0000 mg | Freq: Once | INTRAVENOUS | Status: AC
  Administered 2019-04-29: 400 mg via INTRAVENOUS
  Filled 2019-04-29: qty 16

## 2019-04-29 MED ORDER — SODIUM CHLORIDE 0.9% FLUSH
10.0000 mL | INTRAVENOUS | Status: DC | PRN
  Administered 2019-04-29: 10 mL
  Filled 2019-04-29: qty 10

## 2019-04-29 MED ORDER — ALBUTEROL SULFATE HFA 108 (90 BASE) MCG/ACT IN AERS: 2.0000 | INHALATION_SPRAY | Freq: Four times a day (QID) | RESPIRATORY_TRACT | 2 refills | Status: DC | PRN

## 2019-04-29 MED ORDER — HEPARIN SOD (PORK) LOCK FLUSH 100 UNIT/ML IV SOLN
500.0000 [IU] | Freq: Once | INTRAVENOUS | Status: AC | PRN
  Administered 2019-04-29: 500 [IU]
  Filled 2019-04-29: qty 5

## 2019-04-29 NOTE — Telephone Encounter (Signed)
Appointments scheduled calendar printed per 10/5 los

## 2019-04-29 NOTE — Patient Instructions (Signed)

## 2019-04-29 NOTE — Progress Notes (Signed)
Hematology and Oncology Follow Up Visit  Donna Curry [id number] 07-03-1956 63 y.o. 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 6 week --s/p cycle #5 -- start on 11/14/2018 -- changed on 03/06/2019   Interim History:  Donna Curry is here today for follow-up.  Shockingly, her weight is up 20 pounds.,  Sure how she is doing this but certainly the Marinol in the Megace seem to be helping her.  She has had no problems with her urine.  There is been no blood in the urine.  She has had no issues with cough or shortness of breath.  She has been very cautious with the coronavirus.  I think if she were to get the coronavirus, it probably would do serious damage to her.  She has had no problem with rashes.  Her pain has been well controlled with oxycodone.  She enjoys having the pembrolizumab every 6 weeks.  This makes it much more convenient for her.  She has had no headache.  She has had no mouth sores.  There is been no leg swelling.  She ran out of quite a few of her medicines.  I did go ahead and refill these until she could see her family doctor.  Overall, her performance status is ECOG 1.    Medications:  Allergies as of 04/29/2019   No Known Allergies     Medication List       Accurate as of April 29, 2019 12:26 PM. If you have any questions, ask your nurse or doctor.        acetaminophen 500 MG tablet Commonly known as: TYLENOL Take 1,000 mg by mouth every 8 (eight) hours as needed for moderate pain.   albuterol (2.5 MG/3ML) 0.083% nebulizer solution Commonly known as: PROVENTIL Take 3 mLs (2.5 mg total) by nebulization every 4 (four) hours as needed for wheezing or shortness of breath.   albuterol 108 (90 Base) MCG/ACT inhaler  Commonly known as: VENTOLIN HFA Inhale 2 puffs into the lungs every 6 (six) hours as needed for wheezing or shortness of breath.   amLODipine 10 MG tablet Commonly known as: NORVASC Take 1 tablet (10 mg total) by mouth daily.   aspirin EC 81 MG tablet Take 81 mg by mouth daily.   citalopram 10 MG tablet Commonly known as: CeleXA Take 1 tablet (10 mg total) by mouth daily.   dronabinol 2.5 MG capsule Commonly known as: MARINOL Take 2 capsules (5 mg total) by mouth 2 (two) times daily before a meal.   lidocaine-prilocaine cream Commonly known as: EMLA Apply to affected area once   lidocaine-prilocaine cream Commonly known as: EMLA Apply 1 application topically as needed.   megestrol 20 MG tablet Commonly known as: MEGACE Take 1 tablet (20 mg total) by mouth daily.   ondansetron 4 MG disintegrating tablet Commonly known as: Zofran ODT Take 1 tablet (4 mg total) by mouth every 8 (eight) hours as needed for nausea or vomiting.   ondansetron 8 MG tablet Commonly known as: ZOFRAN Take 1 tablet (8 mg total) by mouth every 8 (eight) hours as needed for nausea or vomiting.   oxyCODONE 5 MG immediate release tablet Commonly known as: Oxy IR/ROXICODONE Take 1 tablet (5 mg total) by mouth every 6 (six) hours as needed for  severe pain.   prochlorperazine 10 MG tablet Commonly known as: COMPAZINE Take 1 tablet (10 mg total) by mouth every 6 (six) hours as needed for nausea or vomiting.   Symbicort 160-4.5 MCG/ACT inhaler Generic drug: budesonide-formoterol Inhale 2 puffs into the lungs 2 (two) times daily.   Vitamin D (Ergocalciferol) 1.25 MG (50000 UT) Caps capsule Commonly known as: DRISDOL Take 1 capsule by mouth once a week       Allergies: No Known Allergies  Past Medical History, Surgical history, Social history, and Family History were reviewed and updated.  Review of Systems: Review of Systems  Constitutional: Negative.   HENT: Negative.   Eyes: Negative.    Respiratory: Positive for wheezing.   Cardiovascular: Negative.   Gastrointestinal: Positive for abdominal pain.  Genitourinary: Negative.   Musculoskeletal: Positive for back pain.  Skin: Negative.   Neurological: Negative.   Endo/Heme/Allergies: Negative.   Psychiatric/Behavioral: Negative.       Physical Exam:  weight is 75 lb (34 kg). Her temporal temperature is 97.3 F (36.3 C) (abnormal). Her blood pressure is 131/79 and her pulse is 100. Her respiration is 19 and oxygen saturation is 98%.   Wt Readings from Last 3 Encounters:  04/29/19 75 lb (34 kg)  03/06/19 71 lb (32.2 kg)  02/06/19 78 lb (35.4 kg)    Physical Exam Vitals signs reviewed.  HENT:     Head: Normocephalic and atraumatic.  Eyes:     Pupils: Pupils are equal, round, and reactive to light.  Neck:     Musculoskeletal: Normal range of motion.  Cardiovascular:     Rate and Rhythm: Normal rate and regular rhythm.     Heart sounds: Normal heart sounds.  Pulmonary:     Effort: Pulmonary effort is normal.     Breath sounds: Normal breath sounds.  Abdominal:     General: Bowel sounds are normal.     Palpations: Abdomen is soft.  Musculoskeletal: Normal range of motion.        General: No tenderness or deformity.  Lymphadenopathy:     Cervical: No cervical adenopathy.  Skin:    General: Skin is warm and dry.     Findings: No erythema or rash.  Neurological:     Mental Status: She is alert and oriented to person, place, and time.  Psychiatric:        Behavior: Behavior normal.        Thought Content: Thought content normal.        Judgment: Judgment normal.      Lab Results  Component Value Date   WBC 3.8 (L) 04/29/2019   HGB 12.7 04/29/2019   HCT 37.7 04/29/2019   MCV 95.9 04/29/2019   PLT 171 04/29/2019   Lab Results  Component Value Date   FERRITIN 10 (L) 12/26/2018   IRON 34 (L) 12/26/2018   TIBC 353 12/26/2018   UIBC 319 12/26/2018   IRONPCTSAT 10 (L) 12/26/2018   Lab Results   Component Value Date   RBC 3.93 04/29/2019   No results found for: KPAFRELGTCHN, LAMBDASER, KAPLAMBRATIO No results found for: IGGSERUM, IGA, IGMSERUM No results found for: Kathrynn Ducking, MSPIKE, SPEI   Chemistry      Component Value Date/Time   NA 138 04/29/2019 1055   K 3.7 04/29/2019 1055   CL 105 04/29/2019 1055   CO2 26 04/29/2019 1055   BUN 11 04/29/2019 1055   CREATININE 0.81 04/29/2019 1055   CREATININE 1.10  08/20/2013 1027      Component Value Date/Time   CALCIUM 9.4 04/29/2019 1055   ALKPHOS 73 04/29/2019 1055   AST 14 (L) 04/29/2019 1055   ALT 10 04/29/2019 1055   BILITOT 0.4 04/29/2019 1055       Impression and Plan: Ms. Aydt is a very pleasant 63 yo African American female with 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  Again, we have her on pembrolizumab.  The PET scan is quite reassuring.  I am just grateful that we have not had to use chemotherapy on her.  We will go ahead with the every 6-week dosing.  We will adjust things even further over the holiday season.  I will have her come back in 6 weeks.  This will make her right before Thanksgiving.    [name], MD 10/5/202012:26 PM

## 2019-05-13 ENCOUNTER — Encounter (HOSPITAL_BASED_OUTPATIENT_CLINIC_OR_DEPARTMENT_OTHER): Payer: Self-pay | Admitting: *Deleted

## 2019-05-13 ENCOUNTER — Ambulatory Visit: Payer: Self-pay

## 2019-05-13 ENCOUNTER — Other Ambulatory Visit: Payer: Self-pay

## 2019-05-13 ENCOUNTER — Emergency Department (HOSPITAL_BASED_OUTPATIENT_CLINIC_OR_DEPARTMENT_OTHER)
Admission: EM | Admit: 2019-05-13 | Discharge: 2019-05-13 | Disposition: A | Discharge status: Home / Self Care | Payer: Medicare Other | Attending: Emergency Medicine | Admitting: Emergency Medicine

## 2019-05-13 ENCOUNTER — Emergency Department (HOSPITAL_BASED_OUTPATIENT_CLINIC_OR_DEPARTMENT_OTHER): Payer: Medicare Other

## 2019-05-13 DIAGNOSIS — I10 Essential (primary) hypertension: Secondary | ICD-10-CM | POA: Insufficient documentation

## 2019-05-13 DIAGNOSIS — J441 Chronic obstructive pulmonary disease with (acute) exacerbation: Secondary | ICD-10-CM | POA: Diagnosis not present

## 2019-05-13 DIAGNOSIS — Z87891 Personal history of nicotine dependence: Secondary | ICD-10-CM | POA: Diagnosis not present

## 2019-05-13 DIAGNOSIS — Z79899 Other long term (current) drug therapy: Secondary | ICD-10-CM | POA: Diagnosis not present

## 2019-05-13 DIAGNOSIS — Z20828 Contact with and (suspected) exposure to other viral communicable diseases: Secondary | ICD-10-CM | POA: Insufficient documentation

## 2019-05-13 DIAGNOSIS — Z8551 Personal history of malignant neoplasm of bladder: Secondary | ICD-10-CM | POA: Diagnosis not present

## 2019-05-13 DIAGNOSIS — Z7982 Long term (current) use of aspirin: Secondary | ICD-10-CM | POA: Insufficient documentation

## 2019-05-13 DIAGNOSIS — Z8673 Personal history of transient ischemic attack (TIA), and cerebral infarction without residual deficits: Secondary | ICD-10-CM | POA: Insufficient documentation

## 2019-05-13 DIAGNOSIS — R0602 Shortness of breath: Secondary | ICD-10-CM | POA: Diagnosis present

## 2019-05-13 MED ORDER — ALBUTEROL SULFATE (2.5 MG/3ML) 0.083% IN NEBU: 2.5000 mg | INHALATION_SOLUTION | Freq: Four times a day (QID) | RESPIRATORY_TRACT | 12 refills | Status: DC | PRN

## 2019-05-13 MED ORDER — PREDNISONE 20 MG PO TABS: ORAL_TABLET | ORAL | 0 refills | Status: DC

## 2019-05-13 MED ORDER — ALBUTEROL SULFATE HFA 108 (90 BASE) MCG/ACT IN AERS
4.0000 | INHALATION_SPRAY | Freq: Once | RESPIRATORY_TRACT | Status: AC
  Administered 2019-05-13: 4 via RESPIRATORY_TRACT
  Filled 2019-05-13: qty 6.7

## 2019-05-13 NOTE — Discharge Instructions (Signed)
1.  Start taking the prednisone today when you fill it at the pharmacy.  Use a albuterol inhaler or your nebulizer machine every 4-6 hours for the next 2 days.  Once symptoms are improving, you may go to as needed with your albuterol. 2.  A Covid test was done in the emergency department.  Your results should be ready within 1 to 2 days. 3.  Return to the emergency department if you develop fever increasing shortness of breath, chest pain or other concerning symptoms.

## 2019-05-13 NOTE — ED Provider Notes (Signed)
[facility] EMERGENCY DEPARTMENT Provider Note   CSN: [id number] Arrival date & time: 05/13/19  1138     History   Chief Complaint Chief Complaint  Patient presents with  . Shortness of Breath  . Cough    HPI Donna Curry is a 63 y.o. female.     HPI   Patient reports that she has been wheezing and feels like her albuterol inhaler is not working.  She reports that she is not getting any chest pain.  She reports that she walks very far however she will start wheezing and get short of breath.  She denies she had fever or chills.  She does not feel fatigued or as if she has body aches.  No nasal congestion or drainage.  She reports she has some cough but no sputum production.  She reports other than feeling short of breath with exertion she feels good.  Patient has history of bladder cancer.  She reports that she is not having any problems with pain.  She is not having any swelling in her legs.  No pain behind her calves.  She reports that her doctor advised her to come to the emergency department to get a breathing treatment.  She reports she has run out of medication for her nebulizer machine at home.  She reports she does have her albuterol inhaler but she does not feel like there is anything coming out of it. Past Medical History:  Diagnosis Date  . Anemia   . Asthma   . Dyspnea    with exertion   . Family history of adverse reaction to anesthesia    brother wakes up and dont know who he is and sister has n/v  . Goals of care, counseling/discussion 02/09/2018  . History of blood transfusion   . Hypertension   . Iron deficiency anemia due to chronic blood loss 03/01/2018  . Measles as a child  . Mumps as a child  . TIA (transient ischemic attack)   . Tobacco abuse     Patient Active Problem List   Diagnosis Date Noted  . Protein-calorie malnutrition, severe 09/03/2018  . Acute respiratory distress 08/31/2018  . Acute respiratory failure with hypoxia ([place])  08/31/2018  . HCAP (healthcare-associated pneumonia) 08/06/2018  . Emphysema of lung ([place]) 08/06/2018  . Acute on chronic respiratory failure with hypoxia ([place]) 08/06/2018  . Iron deficiency anemia due to chronic blood loss 03/01/2018  . Goals of care, counseling/discussion 02/09/2018  . Bladder cancer ([place]) 12/13/2017  . Hypertensive cardiomyopathy, 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[place]) 04/29/2016  . Hypokalemia 04/02/2016  . Protein-energy malnutrition ([place]) 04/02/2016  . Hypertensive emergency 04/01/2016  . Dizziness and giddiness 04/12/2013  . Palpitations 03/27/2013  . Weight loss 06/08/2012  . Degenerative disc disease, lumbar 01/30/2012  . Adrenal mass ([place]) 01/30/2012  . Hypercalcemia 01/30/2012  . Hyperproteinemia 01/30/2012  . Borderline diabetes 01/30/2012  . Normocytic anemia 03/11/2011  . TOBACCO ABUSE 08/31/2009  . Essential hypertension 08/31/2009    Past Surgical History:  Procedure Laterality Date  . CYSTOSCOPY W/ URETERAL STENT PLACEMENT Right 12/13/2017   Procedure: CYSTOSCOPY WITH RIGHT RETROGRADE PYELOGRAM/URETERAL RIGHT STENT PLACEMENT;  Surgeon: [name], MD;  Location: [facility];  Service: Urology;  Laterality: Right;  . IR IMAGING GUIDED PORT INSERTION  02/23/2018  . TRANSURETHRAL RESECTION OF BLADDER TUMOR N/A 12/13/2017   Procedure: TRANSURETHRAL RESECTION OF BLADDER TUMOR (TURBT);  Surgeon: [name], MD;  Location: [facility];  Service: Urology;  Laterality: N/A;  . TRANSURETHRAL RESECTION OF BLADDER TUMOR N/A 10/03/2018   Procedure: TRANSURETHRAL RESECTION OF BLADDER TUMOR (TURBT);  Surgeon: [name], MD;  Location: [facility];  Service: Urology;  Laterality: N/A;  . TRANSURETHRAL RESECTION OF BLADDER TUMOR WITH GYRUS (TURBT-GYRUS)     [name] 12-13-17  . tubes tied    . uterine ablation     about 2005     OB History   No obstetric history on file.      Home Medications    Prior to Admission medications   Medication Sig Start Date End  Date Taking? Authorizing Provider  acetaminophen (TYLENOL) 500 MG tablet Take 1,000 mg by mouth every 8 (eight) hours as needed for moderate pain.    [provider]  albuterol (PROVENTIL) (2.5 MG/3ML) 0.083% nebulizer solution Take 3 mLs (2.5 mg total) by nebulization every 4 (four) hours as needed for wheezing or shortness of breath. 09/03/18   Patrecia Pour, MD  albuterol (PROVENTIL) (2.5 MG/3ML) 0.083% nebulizer solution Take 3 mLs (2.5 mg total) by nebulization every 6 (six) hours as needed for wheezing or shortness of breath. 05/13/19   Charlesetta Shanks, MD  albuterol (VENTOLIN HFA) 108 (90 Base) MCG/ACT inhaler Inhale 2 puffs into the lungs every 6 (six) hours as needed for wheezing or shortness of breath. 04/29/19   [name], MD  amLODipine (NORVASC) 10 MG tablet Take 1 tablet (10 mg total) by mouth daily. 04/29/19   [name], MD  aspirin EC 81 MG tablet Take 81 mg by mouth daily.     [provider]  citalopram (CELEXA) 10 MG tablet Take 1 tablet (10 mg total) by mouth daily. 03/25/19   [name], NP  dronabinol (MARINOL) 2.5 MG capsule Take 2 capsules (5 mg total) by mouth 2 (two) times daily before a meal. 04/26/19   [name], [name], MD  lidocaine-prilocaine (EMLA) cream Apply to affected area once 11/06/18   [name], MD  lidocaine-prilocaine (EMLA) cream Apply 1 application topically as needed. 03/27/19   [name], MD  megestrol (MEGACE) 20 MG tablet Take 1 tablet (20 mg total) by mouth daily. 03/27/19   [name], MD  ondansetron (ZOFRAN ODT) 4 MG disintegrating tablet Take 1 tablet (4 mg total) by mouth every 8 (eight) hours as needed for nausea or vomiting. 03/27/19   [name], MD  ondansetron (ZOFRAN) 8 MG tablet Take 1 tablet (8 mg total) by mouth every 8 (eight) hours as needed for nausea or vomiting. 01/16/19   [name], MD  oxyCODONE (OXY IR/ROXICODONE) 5 MG immediate release tablet Take 1 tablet (5 mg total) by  mouth every 6 (six) hours as needed for severe pain. 04/29/19   [name], MD  predniSONE (DELTASONE) 20 MG tablet 2 tabs po daily x 3 days 05/13/19   Charlesetta Shanks, MD  prochlorperazine (COMPAZINE) 10 MG tablet Take 1 tablet (10 mg total) by mouth every 6 (six) hours as needed for nausea or vomiting. 03/27/19   [name], MD  SYMBICORT 160-4.5 MCG/ACT inhaler Inhale 2 puffs into the lungs 2 (two) times daily. 03/05/19   Colon Branch, MD  Vitamin D, Ergocalciferol, (DRISDOL) 1.25 MG (50000 UT) CAPS capsule Take 1 capsule by mouth once a week 12/26/18   [name], MD    Family History Family History  Problem Relation Age of Onset  . Hypertension Mother   . Diabetes  Father   . Hypertension Brother   . HIV Brother     Social History Social History   Tobacco Use  . Smoking status: Former Smoker    Packs/day: 0.25    Types: Cigarettes  . Smokeless tobacco: Never Used  . Tobacco comment: Quit x 6 weeks.  Substance Use Topics  . Alcohol use: No    Alcohol/week: 0.0 standard drinks  . Drug use: Never     Allergies   Patient has no known allergies.   Review of Systems Review of Systems 10 Systems reviewed and are negative for acute change except as noted in the HPI.   Physical Exam Updated Vital Signs BP (!) 145/100 (BP Location: Right Arm)   Pulse 99   Temp 98.5 F (36.9 C) (Oral)   Resp 18   Ht 5\' 5"  (1.651 m)   Wt 44.5 kg   SpO2 95%   BMI 16.31 kg/m   Physical Exam Constitutional:      Comments: Patient is alert and nontoxic.  She is sitting up at the edge of the stretcher in no distress.  She is pleasant and interactive.  No respiratory distress at rest.  HENT:     Head: Normocephalic and atraumatic.  Eyes:     Extraocular Movements: Extraocular movements intact.  Cardiovascular:     Rate and Rhythm: Normal rate and regular rhythm.  Pulmonary:     Comments: Patient does not have any respiratory distress at rest.  She does have diffuse  wheezing throughout both lung fields.  Adequate airflow to the bases. Abdominal:     General: There is no distension.     Palpations: Abdomen is soft.     Tenderness: There is no abdominal tenderness. There is no guarding.  Musculoskeletal: Normal range of motion.        General: No swelling.     Right lower leg: No edema.     Left lower leg: No edema.     Comments: Patient's extremities are extremely thin.  She does not have any peripheral edema.  Her calves and lower legs are nontender.  Skin:    General: Skin is warm and dry.  Neurological:     General: No focal deficit present.     Mental Status: She is oriented to person, place, and time.     Coordination: Coordination normal.  Psychiatric:        Mood and Affect: Mood normal.      ED Treatments / Results  Labs (all labs ordered are listed, but only abnormal results are displayed) Labs Reviewed  NOVEL CORONAVIRUS, NAA (HOSP ORDER, SEND-OUT TO REF LAB; TAT 18-24 HRS)    EKG None  Radiology Dg Chest 2 View  Result Date: 05/13/2019 CLINICAL DATA:  Cough, shortness of breath and wheezing. EXAM: CHEST - 2 VIEW COMPARISON:  August 31, 2018 FINDINGS: Lungs are hyperinflated. No signs of consolidation or pleural effusion. Left nipple shadow projects left of the hilum in the left chest. Seen on previous imaging studies to varying degrees. Cardiomediastinal contours are unchanged. Signs of emphysema and atherosclerosis are similar to prior study. Right-sided Port-A-Cath terminates at the caval to atrial junction. IMPRESSION: 1. No acute cardiopulmonary disease. 2. No other interval changes. Electronically Signed   By: Zetta Bills M.D.   On: 05/13/2019 13:29    Procedures Procedures (including critical care time)  Medications Ordered in ED Medications  albuterol (VENTOLIN HFA) 108 (90 Base) MCG/ACT inhaler 4 puff (4 puffs Inhalation Given 05/13/19  1319)     Initial Impression / Assessment and Plan / ED Course  I have  reviewed the triage vital signs and the nursing notes.  Pertinent labs & imaging results that were available during my care of the patient were reviewed by me and considered in my medical decision making (see chart for details).       Patient has a clinically well appearance.  She does not have any respiratory distress.  She denies she is experiencing any chest pain or fever.  She does have history of bladder cancer.  She has no complaints regarding pain or general illness.  She does not have any lower extremity swelling or calf pain to suggest DVT.  No pleuritic or central chest pain.  On exam, she has diffuse and symmetric wheezing.  Patient reports she does have history of COPD and this feels consistent with cough and shortness of breath with COPD.  Patient was given inhaler and felt improved.  I did check Covid testing as she has had some cough but no infiltrates are identified on chest x-ray.  At this time I feel she is stable for continued treatment for bronchitis and close follow-up with PCP.  Final Clinical Impressions(s) / ED Diagnoses   Final diagnoses:  COPD exacerbation [facility])    ED Discharge Orders         Ordered    predniSONE (DELTASONE) 20 MG tablet  Status:  Discontinued     05/13/19 1513    albuterol (PROVENTIL) (2.5 MG/3ML) 0.083% nebulizer solution  Every 6 hours PRN,   Status:  Discontinued     05/13/19 1513    albuterol (PROVENTIL) (2.5 MG/3ML) 0.083% nebulizer solution  Every 6 hours PRN     05/13/19 1517    predniSONE (DELTASONE) 20 MG tablet     05/13/19 1517           Charlesetta Shanks, MD 05/13/19 1524

## 2019-05-13 NOTE — Telephone Encounter (Signed)
Noted and agree. 

## 2019-05-13 NOTE — ED Notes (Signed)
ED Provider at bedside. 

## 2019-05-13 NOTE — ED Triage Notes (Signed)
SOB and cough. States she has cancer.

## 2019-05-13 NOTE — ED Notes (Signed)
Patient HFA not working and states she is out of solution for her nebs and needs a refill.

## 2019-05-13 NOTE — Telephone Encounter (Signed)
FYI

## 2019-05-13 NOTE — Telephone Encounter (Signed)
Patient called stating that she is SOB especially with activity. She states that she has emphysema and has been out of her nebulizer treatments.  She denies other symptoms of COVID-19. She denies fever and  chest pain.  She states that if she stays in bed she is not SOB. Patient will go to ER for treatment of symptoms. Care advice read to patient. She will call her daughter to transport her to ER. Note will be routed to office.  Reason for Disposition . Patient sounds very sick or weak to the triager  Answer Assessment - Initial Assessment Questions 1. COVID-19 DIAGNOSIS: "Who made your Coronavirus (COVID-19) diagnosis?" "Was it confirmed by a positive lab test?" If not diagnosed by a HCP, ask "Are there lots of cases (community spread) where you live?" (See public health department website, if unsure)     [place] 2. ONSET: "When did the COVID-19 symptoms start?"     SOB  3. WORST SYMPTOM: "What is your worst symptom?" (e.g., cough, fever, shortness of breath, muscle aches)    Worse SOB 4. COUGH: "Do you have a cough?" If so, ask: "How bad is the cough?"       None except with smoking 5. FEVER: "Do you have a fever?" If so, ask: "What is your temperature, how was it measured, and when did it start?"   No 6. RESPIRATORY STATUS: "Describe your breathing?" (e.g., shortness of breath, wheezing, unable to speak)      Wheezes, SOB 7. BETTER-SAME-WORSE: "Are you getting better, staying the same or getting worse compared to yesterday?"  If getting worse, ask, "In what way?"   worse 8. HIGH RISK DISEASE: "Do you have any chronic medical problems?" (e.g., asthma, heart or lung disease, weak immune system, etc.)    COPD 9. PREGNANCY: "Is there any chance you are pregnant?" "When was your last menstrual period?"    N/A 10. OTHER SYMPTOMS: "Do you have any other symptoms?"  (e.g., chills, fatigue, headache, loss of smell or taste, muscle pain, sore throat)      none  Protocols used: CORONAVIRUS  (COVID-19) DIAGNOSED OR SUSPECTED-A-AH

## 2019-05-14 ENCOUNTER — Telehealth: Payer: Self-pay | Admitting: Family

## 2019-05-14 NOTE — Telephone Encounter (Signed)
Please contact pt to schedule a face to face follow up with me.

## 2019-05-14 NOTE — Telephone Encounter (Signed)
Called pt no answer left vm to call back

## 2019-05-14 NOTE — Telephone Encounter (Signed)
Called pt she stated she had to find a ride and needed for me to call her back this afternoon to made appt.

## 2019-05-15 LAB — NOVEL CORONAVIRUS, NAA (HOSP ORDER, SEND-OUT TO REF LAB; TAT 18-24 HRS): SARS-CoV-2, NAA: NOT DETECTED

## 2019-05-20 ENCOUNTER — Other Ambulatory Visit: Payer: Self-pay | Admitting: Family

## 2019-05-20 DIAGNOSIS — C670 Malignant neoplasm of trigone of bladder: Secondary | ICD-10-CM

## 2019-05-20 NOTE — Telephone Encounter (Signed)
Patient reports she is aware she needs an appointment. She is waiting for her daughter to be available to bring her in. She will call for appointment.

## 2019-05-20 NOTE — Telephone Encounter (Signed)
Do you mind trying her one more time please? If you are unable to reach her can you please send a recall letter?

## 2019-05-20 NOTE — Telephone Encounter (Signed)
Pt needs a refill on oxycodone. [facility] in [place]

## 2019-05-23 ENCOUNTER — Other Ambulatory Visit: Payer: Self-pay | Admitting: *Deleted

## 2019-05-23 DIAGNOSIS — C670 Malignant neoplasm of trigone of bladder: Secondary | ICD-10-CM

## 2019-05-23 MED ORDER — OXYCODONE HCL 5 MG PO TABS: 5.0000 mg | ORAL_TABLET | Freq: Four times a day (QID) | ORAL | 0 refills | Status: DC | PRN

## 2019-05-28 ENCOUNTER — Ambulatory Visit (INDEPENDENT_AMBULATORY_CARE_PROVIDER_SITE_OTHER): Payer: Medicare Other | Admitting: Family

## 2019-05-28 ENCOUNTER — Other Ambulatory Visit: Payer: Self-pay

## 2019-05-28 ENCOUNTER — Telehealth: Payer: Self-pay

## 2019-05-28 ENCOUNTER — Telehealth: Payer: Self-pay | Admitting: Family

## 2019-05-28 DIAGNOSIS — G4700 Insomnia, unspecified: Secondary | ICD-10-CM

## 2019-05-28 DIAGNOSIS — J441 Chronic obstructive pulmonary disease with (acute) exacerbation: Secondary | ICD-10-CM | POA: Diagnosis not present

## 2019-05-28 DIAGNOSIS — R61 Generalized hyperhidrosis: Secondary | ICD-10-CM | POA: Diagnosis not present

## 2019-05-28 MED ORDER — TRELEGY ELLIPTA 100-62.5-25 MCG/INH IN AEPB: 1.0000 | INHALATION_SPRAY | Freq: Two times a day (BID) | RESPIRATORY_TRACT | 3 refills | Status: DC

## 2019-05-28 MED ORDER — TRELEGY ELLIPTA 100-62.5-25 MCG/INH IN AEPB: 1.0000 | INHALATION_SPRAY | Freq: Two times a day (BID) | RESPIRATORY_TRACT | 5 refills | Status: DC

## 2019-05-28 MED ORDER — AMITRIPTYLINE HCL 10 MG PO TABS: 10.0000 mg | ORAL_TABLET | Freq: Every evening | ORAL | 1 refills | Status: DC | PRN

## 2019-05-28 NOTE — Telephone Encounter (Signed)
Please advise 

## 2019-05-28 NOTE — Telephone Encounter (Signed)
Done

## 2019-05-28 NOTE — Telephone Encounter (Signed)
Could you please schedule pt for lab visit on 11/16 and coordinate before her hematology appointment upstairs?    I would like to see her for a face to face visit in 6 months.

## 2019-05-28 NOTE — Telephone Encounter (Signed)
PA initiated via Covermymeds; KEY: [id number]. Per Covermymeds PA not needed. See message from plan below:   This medication or product is on your plan's list of covered drugs. Prior authorization is not required at this time. If your pharmacy has questions regarding the processing of your prescription, please have them call the [facility] pharmacy help desk at [phone]. **Please note: Formulary lowering, tiering exception, cost reduction and/or pre-benefit determination review (including prospective Medicare hospice reviews) requests cannot be requested using this method of submission. Please contact us at [phone] instead

## 2019-05-28 NOTE — Telephone Encounter (Signed)
Estill Bamberg from  [facility] [place] [address]  [place] [address]  Phone: [phone] Fax: [phone]    Called in to discuss following medication Fluticasone-Umeclidin-Vilant (TRELEGY ELLIPTA) 100-62.5-25 MCG/INH AEPB [[id number]]   Its supposed to be 1 puff once a day however its prescribed 1 puff twice a day  Patient current insurance will not cover. Its not dosed that way . Please advise

## 2019-05-28 NOTE — Progress Notes (Signed)
Virtual Visit via Telephone Note  I connected with Donna Curry on 05/28/19 at  1:00 PM EST by telephone and verified that I am speaking with the correct person using two identifiers.  Location: Patient: home Provider: work   I discussed the limitations, risks, security and privacy concerns of performing an evaluation and management service by telephone and the availability of in person appointments. I also discussed with the patient that there may be a patient responsible charge related to this service. The patient expressed understanding and agreed to proceed.   History of Present Illness:  Patient is a 63 yr old female who presents today for ED follow up.  ED note is reviewed from 05/13/19.  Her chief complaint at that time was wheezing and SOB.  She apparently had run out of her nebulizer treatments at home.  Her oxygen saturation was 95%.  A chest x-ray was performed which noted no acute changes.  She was given a short course of oral prednisone with a dosing of 40 mg daily for 3 days. Reports very mild wheezing today. Only mild DOE. Denies recent fever.  She does report some chest congestion. Denies cough.  She reports that she is currently taking Symbicort but felt that the Trelegy Ellipta worked much better.  She wishes to return to [place].  HTN-blood pressure was noted to be elevated at the time of her visit.  BP Readings from Last 3 Encounters:  05/13/19 (!) 145/100  04/29/19 131/79  03/06/19 (!) 148/86   Reports that she has had trouble sleeping for 3-4 months. Falls asleep but then wakes back up again.  Sometimes she gets up because she has to urinate and sometimes she just wakes up.   Reports that that she has been having hot flashes at night. Started a few months back.    Past Medical History:  Diagnosis Date  . Anemia   . Asthma   . Dyspnea    with exertion   . Family history of adverse reaction to anesthesia    brother wakes up and dont know who he is and  sister has n/v  . Goals of care, counseling/discussion 02/09/2018  . History of blood transfusion   . Hypertension   . Iron deficiency anemia due to chronic blood loss 03/01/2018  . Measles as a child  . Mumps as a child  . TIA (transient ischemic attack)   . Tobacco abuse      Social History   Socioeconomic History  . Marital status: Single    Spouse name: Not on file  . Number of children: Not on file  . Years of education: Not on file  . Highest education level: Not on file  Occupational History  . Not on file  Social Needs  . Financial resource strain: Not on file  . Food insecurity    Worry: Not on file    Inability: Not on file  . Transportation needs    Medical: Not on file    Non-medical: Not on file  Tobacco Use  . Smoking status: Former Smoker    Packs/day: 0.25    Types: Cigarettes  . Smokeless tobacco: Never Used  . Tobacco comment: Quit x 6 weeks.  Substance and Sexual Activity  . Alcohol use: No    Alcohol/week: 0.0 standard drinks  . Drug use: Never  . Sexual activity: Not Currently  Lifestyle  . Physical activity    Days per week: Not on file    Minutes  per session: Not on file  . Stress: Not on file  Relationships  . Social Herbalist on phone: Not on file    Gets together: Not on file    Attends religious service: Not on file    Active member of club or organization: Not on file    Attends meetings of clubs or organizations: Not on file    Relationship status: Not on file  . Intimate partner violence    Fear of current or ex partner: Not on file    Emotionally abused: Not on file    Physically abused: Not on file    Forced sexual activity: Not on file  Other Topics Concern  . Not on file  Social History Narrative   Denies hx of drug use   Single   1 daughter age 30 lives with daughter and grandson who is 17.   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    Past Surgical History:  Procedure  Laterality Date  . CYSTOSCOPY W/ URETERAL STENT PLACEMENT Right 12/13/2017   Procedure: CYSTOSCOPY WITH RIGHT RETROGRADE PYELOGRAM/URETERAL RIGHT STENT PLACEMENT;  Surgeon: [name], MD;  Location: [facility];  Service: Urology;  Laterality: Right;  . IR IMAGING GUIDED PORT INSERTION  02/23/2018  . TRANSURETHRAL RESECTION OF BLADDER TUMOR N/A 12/13/2017   Procedure: TRANSURETHRAL RESECTION OF BLADDER TUMOR (TURBT);  Surgeon: [name], MD;  Location: [facility];  Service: Urology;  Laterality: N/A;  . TRANSURETHRAL RESECTION OF BLADDER TUMOR N/A 10/03/2018   Procedure: TRANSURETHRAL RESECTION OF BLADDER TUMOR (TURBT);  Surgeon: [name], MD;  Location: [facility];  Service: Urology;  Laterality: N/A;  . TRANSURETHRAL RESECTION OF BLADDER TUMOR WITH GYRUS (TURBT-GYRUS)     [name] 12-13-17  . tubes tied    . uterine ablation     about 2005    Family History  Problem Relation Age of Onset  . Hypertension Mother   . Diabetes Father   . Hypertension Brother   . HIV Brother     No Known Allergies  Current Outpatient Medications on File Prior to Visit  Medication Sig Dispense Refill  . acetaminophen (TYLENOL) 500 MG tablet Take 1,000 mg by mouth every 8 (eight) hours as needed for moderate pain.    Marland Kitchen albuterol (PROVENTIL) (2.5 MG/3ML) 0.083% nebulizer solution Take 3 mLs (2.5 mg total) by nebulization every 4 (four) hours as needed for wheezing or shortness of breath. 75 mL 0  . albuterol (PROVENTIL) (2.5 MG/3ML) 0.083% nebulizer solution Take 3 mLs (2.5 mg total) by nebulization every 6 (six) hours as needed for wheezing or shortness of breath. 75 mL 12  . albuterol (VENTOLIN HFA) 108 (90 Base) MCG/ACT inhaler Inhale 2 puffs into the lungs every 6 (six) hours as needed for wheezing or shortness of breath. 18 g 2  . amLODipine (NORVASC) 10 MG tablet Take 1 tablet (10 mg total) by mouth daily. 90 tablet 0  . aspirin EC 81 MG tablet Take 81 mg by mouth daily.     . citalopram  (CELEXA) 10 MG tablet Take 1 tablet (10 mg total) by mouth daily. 90 tablet 1  . dronabinol (MARINOL) 2.5 MG capsule Take 2 capsules (5 mg total) by mouth 2 (two) times daily before a meal. 60 capsule 2  . lidocaine-prilocaine (EMLA) cream Apply to affected area once 30 g 3  . lidocaine-prilocaine (EMLA) cream Apply 1 application topically  as needed. 30 g 0  . megestrol (MEGACE) 20 MG tablet Take 1 tablet (20 mg total) by mouth daily. 30 tablet 0  . ondansetron (ZOFRAN ODT) 4 MG disintegrating tablet Take 1 tablet (4 mg total) by mouth every 8 (eight) hours as needed for nausea or vomiting. 12 tablet 0  . ondansetron (ZOFRAN) 8 MG tablet Take 1 tablet (8 mg total) by mouth every 8 (eight) hours as needed for nausea or vomiting. 20 tablet 0  . oxyCODONE (OXY IR/ROXICODONE) 5 MG immediate release tablet Take 1 tablet (5 mg total) by mouth every 6 (six) hours as needed for severe pain. 60 tablet 0  . predniSONE (DELTASONE) 20 MG tablet 2 tabs po daily x 3 days 6 tablet 0  . prochlorperazine (COMPAZINE) 10 MG tablet Take 1 tablet (10 mg total) by mouth every 6 (six) hours as needed for nausea or vomiting. 30 tablet 0  . SYMBICORT 160-4.5 MCG/ACT inhaler Inhale 2 puffs into the lungs 2 (two) times daily. 1 Inhaler 1  . Vitamin D, Ergocalciferol, (DRISDOL) 1.25 MG (50000 UT) CAPS capsule Take 1 capsule by mouth once a week 12 capsule 0   No current facility-administered medications on file prior to visit.     There were no vitals taken for this visit.   Observations/Objective:   Gen: Awake, alert Resp: Breathing is even and non-labored Psych: calm/pleasant demeanor Neuro: Alert and Oriented x 3,  speech is clear.    Assessment and Plan:  Acute COPD exacerbation-clinically improved.  Will DC Symbicort and begin Trelegy Ellipta.  I also advised that she try Mucinex 600 mg twice daily as needed for chest congestion.  Insomnia-we will give patient low-dose trial of amitriptyline to use as  needed.  Night sweats-question if related to underlying malignancy.  Would like to obtain a TB Gold test and HIV screen.  Plan to recheck BP next visit. Was likely elevated due to anxiety in the setting of SOB.  Follow Up Instructions:    I discussed the assessment and treatment plan with the patient. The patient was provided an opportunity to ask questions and all were answered. The patient agreed with the plan and demonstrated an understanding of the instructions.   The patient was advised to call back or seek an in-person evaluation if the symptoms worsen or if the condition fails to improve as anticipated.  I provided 11 minutes of non-face-to-face time during this encounter.   Nance Pear, NP

## 2019-06-10 ENCOUNTER — Ambulatory Visit: Payer: Medicare Other

## 2019-06-10 ENCOUNTER — Inpatient Hospital Stay: Payer: Medicare Other

## 2019-06-10 ENCOUNTER — Other Ambulatory Visit: Payer: Medicare Other

## 2019-06-10 ENCOUNTER — Ambulatory Visit: Payer: Medicare Other | Admitting: Hematology & Oncology

## 2019-06-10 ENCOUNTER — Inpatient Hospital Stay: Payer: Medicare Other | Admitting: Hematology & Oncology

## 2019-06-10 ENCOUNTER — Inpatient Hospital Stay: Payer: Medicare Other | Attending: Hematology & Oncology

## 2019-06-10 DIAGNOSIS — C672 Malignant neoplasm of lateral wall of bladder: Secondary | ICD-10-CM | POA: Insufficient documentation

## 2019-06-10 DIAGNOSIS — Z5112 Encounter for antineoplastic immunotherapy: Secondary | ICD-10-CM | POA: Insufficient documentation

## 2019-06-10 DIAGNOSIS — Z79899 Other long term (current) drug therapy: Secondary | ICD-10-CM | POA: Insufficient documentation

## 2019-06-13 ENCOUNTER — Other Ambulatory Visit (INDEPENDENT_AMBULATORY_CARE_PROVIDER_SITE_OTHER): Payer: Medicare Other

## 2019-06-13 ENCOUNTER — Inpatient Hospital Stay: Payer: Medicare Other

## 2019-06-13 ENCOUNTER — Encounter: Payer: Self-pay | Admitting: Family

## 2019-06-13 ENCOUNTER — Inpatient Hospital Stay (HOSPITAL_BASED_OUTPATIENT_CLINIC_OR_DEPARTMENT_OTHER): Payer: Medicare Other | Admitting: Family

## 2019-06-13 ENCOUNTER — Other Ambulatory Visit: Payer: Self-pay

## 2019-06-13 VITALS — BP 124/88 | HR 96 | Temp 97.6°F | Resp 18 | Wt 74.0 lb

## 2019-06-13 DIAGNOSIS — R61 Generalized hyperhidrosis: Secondary | ICD-10-CM | POA: Diagnosis not present

## 2019-06-13 DIAGNOSIS — C670 Malignant neoplasm of trigone of bladder: Secondary | ICD-10-CM

## 2019-06-13 DIAGNOSIS — E039 Hypothyroidism, unspecified: Secondary | ICD-10-CM | POA: Diagnosis not present

## 2019-06-13 DIAGNOSIS — C672 Malignant neoplasm of lateral wall of bladder: Secondary | ICD-10-CM | POA: Diagnosis present

## 2019-06-13 DIAGNOSIS — Z5112 Encounter for antineoplastic immunotherapy: Secondary | ICD-10-CM | POA: Diagnosis present

## 2019-06-13 DIAGNOSIS — Z79899 Other long term (current) drug therapy: Secondary | ICD-10-CM | POA: Diagnosis not present

## 2019-06-13 LAB — CMP (CANCER CENTER ONLY)
ALT: 15 U/L (ref 0–44)
AST: 15 U/L (ref 15–41)
Albumin: 4.4 g/dL (ref 3.5–5.0)
Alkaline Phosphatase: 75 U/L (ref 38–126)
Anion gap: 7 (ref 5–15)
BUN: 13 mg/dL (ref 8–23)
CO2: 29 mmol/L (ref 22–32)
Calcium: 9.8 mg/dL (ref 8.9–10.3)
Chloride: 106 mmol/L (ref 98–111)
Creatinine: 0.87 mg/dL (ref 0.44–1.00)
GFR, Est AFR Am: >60 mL/min (ref >60)
GFR, Estimated: >60 mL/min (ref >60)
Glucose, Bld: 82 mg/dL (ref 70–99)
Potassium: 4 mmol/L (ref 3.5–5.1)
Sodium: 142 mmol/L (ref 135–145)
Total Bilirubin: 0.4 mg/dL (ref 0.3–1.2)
Total Protein: 6.6 g/dL (ref 6.5–8.1)

## 2019-06-13 LAB — CBC WITH DIFFERENTIAL (CANCER CENTER ONLY)
Abs Immature Granulocytes: 0.02 10*3/uL (ref 0.00–0.07)
Basophils Absolute: 0 10*3/uL (ref 0.0–0.1)
Basophils Relative: 0 %
Eosinophils Absolute: 0.1 10*3/uL (ref 0.0–0.5)
Eosinophils Relative: 1 %
HCT: 38.2 % (ref 36.0–46.0)
Hemoglobin: 12.7 g/dL (ref 12.0–15.0)
Immature Granulocytes: 0 %
Lymphocytes Relative: 19 %
Lymphs Abs: 0.9 10*3/uL (ref 0.7–4.0)
MCH: 31.8 pg (ref 26.0–34.0)
MCHC: 33.2 g/dL (ref 30.0–36.0)
MCV: 95.7 fL (ref 80.0–100.0)
Monocytes Absolute: 0.4 10*3/uL (ref 0.1–1.0)
Monocytes Relative: 9 %
Neutro Abs: 3.6 10*3/uL (ref 1.7–7.7)
Neutrophils Relative %: 71 %
Platelet Count: 209 10*3/uL (ref 150–400)
RBC: 3.99 MIL/uL (ref 3.87–5.11)
RDW: 14.2 % (ref 11.5–15.5)
WBC Count: 5 10*3/uL (ref 4.0–10.5)
nRBC: 0 % (ref 0.0–0.2)

## 2019-06-13 MED ORDER — LIDOCAINE-PRILOCAINE 2.5-2.5 % EX CREA: 1.0000 "application " | TOPICAL_CREAM | CUTANEOUS | 2 refills | Status: DC | PRN

## 2019-06-13 MED ORDER — SODIUM CHLORIDE 0.9 % IV SOLN
Freq: Once | INTRAVENOUS | Status: AC
  Administered 2019-06-13: 12:00:00 via INTRAVENOUS
  Filled 2019-06-13: qty 250

## 2019-06-13 MED ORDER — HEPARIN SOD (PORK) LOCK FLUSH 100 UNIT/ML IV SOLN
500.0000 [IU] | Freq: Once | INTRAVENOUS | Status: AC | PRN
  Administered 2019-06-13: 500 [IU]
  Filled 2019-06-13: qty 5

## 2019-06-13 MED ORDER — SODIUM CHLORIDE 0.9% FLUSH
10.0000 mL | INTRAVENOUS | Status: DC | PRN
  Administered 2019-06-13: 10 mL
  Filled 2019-06-13: qty 10

## 2019-06-13 MED ORDER — SODIUM CHLORIDE 0.9 % IV SOLN
400.0000 mg | Freq: Once | INTRAVENOUS | Status: AC
  Administered 2019-06-13: 400 mg via INTRAVENOUS
  Filled 2019-06-13: qty 16

## 2019-06-13 NOTE — Patient Instructions (Signed)
Pembrolizumab injection What is this medicine? PEMBROLIZUMAB (pem broe liz ue mab) is a monoclonal antibody. It is used to treat bladder cancer, cervical cancer, endometrial cancer, esophageal cancer, head and neck cancer, hepatocellular cancer, Hodgkin lymphoma, kidney cancer, lymphoma, melanoma, Merkel cell carcinoma, lung cancer, stomach cancer, urothelial cancer, and cancers that have a certain genetic condition. This medicine may be used for other purposes; ask your health care provider or pharmacist if you have questions. COMMON BRAND NAME(S): Keytruda What should I tell my health care provider before I take this medicine? They need to know if you have any of these conditions:  diabetes  immune system problems  inflammatory bowel disease  liver disease  lung or breathing disease  lupus  received or scheduled to receive an organ transplant or a stem-cell transplant that uses donor stem cells  an unusual or allergic reaction to pembrolizumab, other medicines, foods, dyes, or preservatives  pregnant or trying to get pregnant  breast-feeding How should I use this medicine? This medicine is for infusion into a vein. It is given by a health care professional in a hospital or clinic setting. A special MedGuide will be given to you before each treatment. Be sure to read this information carefully each time. Talk to your pediatrician regarding the use of this medicine in children. While this drug may be prescribed for selected conditions, precautions do apply. Overdosage: If you think you have taken too much of this medicine contact a poison control center or emergency room at once. NOTE: This medicine is only for you. Do not share this medicine with others. What if I miss a dose? It is important not to miss your dose. Call your doctor or health care professional if you are unable to keep an appointment. What may interact with this medicine? Interactions have not been studied. Give  your health care provider a list of all the medicines, herbs, non-prescription drugs, or dietary supplements you use. Also tell them if you smoke, drink alcohol, or use illegal drugs. Some items may interact with your medicine. This list may not describe all possible interactions. Give your health care provider a list of all the medicines, herbs, non-prescription drugs, or dietary supplements you use. Also tell them if you smoke, drink alcohol, or use illegal drugs. Some items may interact with your medicine. What should I watch for while using this medicine? Your condition will be monitored carefully while you are receiving this medicine. You may need blood work done while you are taking this medicine. Do not become pregnant while taking this medicine or for 4 months after stopping it. Women should inform their doctor if they wish to become pregnant or think they might be pregnant. There is a potential for serious side effects to an unborn child. Talk to your health care professional or pharmacist for more information. Do not breast-feed an infant while taking this medicine or for 4 months after the last dose. What side effects may I notice from receiving this medicine? Side effects that you should report to your doctor or health care professional as soon as possible:  allergic reactions like skin rash, itching or hives, swelling of the face, lips, or tongue  bloody or black, tarry  breathing problems  changes in vision  chest pain  chills  confusion  constipation  cough  diarrhea  dizziness or feeling faint or lightheaded  fast or irregular heartbeat  fever  flushing  hair loss  joint pain  low blood counts - this  medicine may decrease the number of white blood cells, red blood cells and platelets. You may be at increased risk for infections and bleeding.  muscle pain  muscle weakness  persistent headache  redness, blistering, peeling or loosening of the skin,  including inside the mouth  signs and symptoms of high blood sugar such as dizziness; dry mouth; dry skin; fruity breath; nausea; stomach pain; increased hunger or thirst; increased urination  signs and symptoms of kidney injury like trouble passing urine or change in the amount of urine  signs and symptoms of liver injury like dark urine, light-colored stools, loss of appetite, nausea, right upper belly pain, yellowing of the eyes or skin  sweating  swollen lymph nodes  weight loss Side effects that usually do not require medical attention (report to your doctor or health care professional if they continue or are bothersome):  decreased appetite  muscle pain  tiredness This list may not describe all possible side effects. Call your doctor for medical advice about side effects. You may report side effects to FDA at 1-800-FDA-1088. Where should I keep my medicine? This drug is given in a hospital or clinic and will not be stored at home. NOTE: This sheet is a summary. It may not cover all possible information. If you have questions about this medicine, talk to your doctor, pharmacist, or health care provider.  2020 Elsevier/Gold Standard (2018-08-07 13:46:58)

## 2019-06-13 NOTE — Addendum Note (Signed)
Addended by: Eliezer Bottom on: 06/13/2019 12:33 PM   Modules accepted: Orders

## 2019-06-13 NOTE — Progress Notes (Signed)
Hematology and Oncology Follow Up Visit  Donna Curry [id number] [birth year] 63 y.o. 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 6 week --s/pcycle 6 -- starton 11/14/2018 -- changed on 03/06/2019   Interim History:  Donna Curry is here today for follow-up and treatment. She states that she is doing well but has had lower back and left hip pain. She is taking her oxycodone when needed and smoking a little pot as well.  She will try Tylenol for any breakthrough episodes.  No weakness, tenderness, numbness or tingling in her extremities.  No falls or syncopal episodes to report.  No blood loss. No bruising or petechiae.  No fever, chills, n/v, rash, dizziness, SOB, chest pain, palpitations, abdominal pain or changes in bowel or bladder habits.  She states that she has a good appetite and is doing 2-3 Ensure daily. She is hydrating well and her weight is stable.   ECOG Performance Status: 2 - Symptomatic, <50% confined to bed  Medications:  Allergies as of 06/13/2019   No Known Allergies     Medication List       Accurate as of June 13, 2019 10:50 AM. If you have any questions, ask your nurse or doctor.        acetaminophen 500 MG tablet Commonly known as: TYLENOL Take 1,000 mg by mouth every 8 (eight) hours as needed for moderate pain.   albuterol (2.5 MG/3ML) 0.083% nebulizer solution Commonly known as: PROVENTIL Take 3 mLs (2.5 mg total) by nebulization every 4 (four) hours as needed for wheezing or shortness of breath.   albuterol 108 (90 Base) MCG/ACT inhaler Commonly known as: VENTOLIN HFA Inhale 2 puffs into the lungs every 6 (six) hours as needed for wheezing or shortness of breath.   albuterol (2.5  MG/3ML) 0.083% nebulizer solution Commonly known as: PROVENTIL Take 3 mLs (2.5 mg total) by nebulization every 6 (six) hours as needed for wheezing or shortness of breath.   amitriptyline 10 MG tablet Commonly known as: ELAVIL Take 1 tablet (10 mg total) by mouth at bedtime as needed for sleep.   amLODipine 10 MG tablet Commonly known as: NORVASC Take 1 tablet (10 mg total) by mouth daily.   aspirin EC 81 MG tablet Take 81 mg by mouth daily.   citalopram 10 MG tablet Commonly known as: CeleXA Take 1 tablet (10 mg total) by mouth daily.   dronabinol 2.5 MG capsule Commonly known as: MARINOL Take 2 capsules (5 mg total) by mouth 2 (two) times daily before a meal.   lidocaine-prilocaine cream Commonly known as: EMLA Apply to affected area once   lidocaine-prilocaine cream Commonly known as: EMLA Apply 1 application topically as needed.   megestrol 20 MG tablet Commonly known as: MEGACE Take 1 tablet (20 mg total) by mouth daily.   ondansetron 4 MG disintegrating tablet Commonly known as: Zofran ODT Take 1 tablet (4 mg total) by mouth every 8 (eight) hours as needed for nausea or vomiting.   ondansetron 8 MG tablet Commonly known as: ZOFRAN Take 1 tablet (8 mg total) by mouth every 8 (eight) hours as needed for nausea or vomiting.   oxyCODONE 5 MG immediate release tablet Commonly known as: Oxy IR/ROXICODONE Take 1 tablet (5 mg total) by mouth every 6 (  six) hours as needed for severe pain.   prochlorperazine 10 MG tablet Commonly known as: COMPAZINE Take 1 tablet (10 mg total) by mouth every 6 (six) hours as needed for nausea or vomiting.   Trelegy Ellipta 100-62.5-25 MCG/INH Aepb Generic drug: Fluticasone-Umeclidin-Vilant Inhale 1 puff into the lungs 2 (two) times daily.   Vitamin D (Ergocalciferol) 1.25 MG (50000 UT) Caps capsule Commonly known as: DRISDOL Take 1 capsule by mouth once a week       Allergies: No Known Allergies  Past Medical History,  Surgical history, Social history, and Family History were reviewed and updated.  Review of Systems: All other 10 point review of systems is negative.   Physical Exam:  weight is 74 lb (33.6 kg). Her temporal temperature is 97.6 F (36.4 C). Her blood pressure is 124/88 and her pulse is 96. Her respiration is 18 and oxygen saturation is 100%.   Wt Readings from Last 3 Encounters:  06/13/19 74 lb (33.6 kg)  05/13/19 98 lb (44.5 kg)  04/29/19 75 lb (34 kg)    Ocular: Sclerae unicteric, pupils equal, round and reactive to light Ear-nose-throat: Oropharynx clear, dentition fair Lymphatic: No cervical or supraclavicular adenopathy Lungs no rales or rhonchi, good excursion bilaterally Heart regular rate and rhythm, no murmur appreciated Abd soft, nontender, positive bowel sounds, no liver or spleen tip palpated on exam, no fluid wave  MSK no focal spinal tenderness, no joint edema Neuro: non-focal, well-oriented, appropriate affect Breasts: Deferred   Lab Results  Component Value Date   WBC 5.0 06/13/2019   HGB 12.7 06/13/2019   HCT 38.2 06/13/2019   MCV 95.7 06/13/2019   PLT 209 06/13/2019   Lab Results  Component Value Date   FERRITIN 10 (L) 12/26/2018   IRON 34 (L) 12/26/2018   TIBC 353 12/26/2018   UIBC 319 12/26/2018   IRONPCTSAT 10 (L) 12/26/2018   Lab Results  Component Value Date   RBC 3.99 06/13/2019   No results found for: KPAFRELGTCHN, LAMBDASER, KAPLAMBRATIO No results found for: IGGSERUM, IGA, IGMSERUM No results found for: [name], SPEI   Chemistry      Component Value Date/Time   NA 138 04/29/2019 1055   K 3.7 04/29/2019 1055   CL 105 04/29/2019 1055   CO2 26 04/29/2019 1055   BUN 11 04/29/2019 1055   CREATININE 0.81 04/29/2019 1055   CREATININE 1.10 08/20/2013 1027      Component Value Date/Time   CALCIUM 9.4 04/29/2019 1055   ALKPHOS 73 04/29/2019 1055   AST 14 (L) 04/29/2019 1055   ALT  10 04/29/2019 1055   BILITOT 0.4 04/29/2019 1055       Impression and Plan: Donna Curry is a very pleasant 63 yo African American female with 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 We will proceed with treatment today as planned.  We will see her back in another 6 weeks.  She will contact our office with any questions or concerns. We can certainly see her sooner if needed.   Laverna Peace, NP 11/19/202010:50 AM

## 2019-06-13 NOTE — Patient Instructions (Signed)

## 2019-06-15 LAB — HIV ANTIBODY (ROUTINE TESTING W REFLEX): HIV 1&2 Ab, 4th Generation: NONREACTIVE

## 2019-06-15 LAB — QUANTIFERON-TB GOLD PLUS
Mitogen-NIL: >10 IU/mL
NIL: 0.03 IU/mL
QuantiFERON-TB Gold Plus: NEGATIVE
TB1-NIL: 0 IU/mL
TB2-NIL: 0 IU/mL

## 2019-06-17 ENCOUNTER — Encounter: Payer: Self-pay | Admitting: Family

## 2019-06-18 ENCOUNTER — Encounter: Payer: Self-pay | Admitting: Nutrition

## 2019-06-18 NOTE — Progress Notes (Signed)
Mailed out to patient 

## 2019-06-18 NOTE — Progress Notes (Signed)
Provided patient with one complimentary case of ensure enlive.

## 2019-06-24 ENCOUNTER — Other Ambulatory Visit: Payer: Self-pay | Admitting: *Deleted

## 2019-06-24 DIAGNOSIS — C670 Malignant neoplasm of trigone of bladder: Secondary | ICD-10-CM

## 2019-06-24 MED ORDER — OXYCODONE HCL 5 MG PO TABS: 5.0000 mg | ORAL_TABLET | Freq: Four times a day (QID) | ORAL | 0 refills | Status: DC | PRN

## 2019-07-22 ENCOUNTER — Other Ambulatory Visit: Payer: Self-pay | Admitting: *Deleted

## 2019-07-22 DIAGNOSIS — C670 Malignant neoplasm of trigone of bladder: Secondary | ICD-10-CM

## 2019-07-22 MED ORDER — OXYCODONE HCL 5 MG PO TABS: 5.0000 mg | ORAL_TABLET | Freq: Four times a day (QID) | ORAL | 0 refills | Status: DC | PRN

## 2019-07-25 ENCOUNTER — Ambulatory Visit: Payer: Medicare Other | Admitting: Hematology & Oncology

## 2019-07-25 ENCOUNTER — Other Ambulatory Visit: Payer: Medicare Other

## 2019-07-25 ENCOUNTER — Ambulatory Visit: Payer: Medicare Other

## 2019-07-30 ENCOUNTER — Inpatient Hospital Stay: Payer: Medicare Other | Admitting: Family

## 2019-07-30 ENCOUNTER — Inpatient Hospital Stay: Payer: Medicare Other

## 2019-08-01 ENCOUNTER — Other Ambulatory Visit: Payer: Medicare Other

## 2019-08-01 ENCOUNTER — Ambulatory Visit: Payer: Medicare Other

## 2019-08-01 ENCOUNTER — Ambulatory Visit: Payer: Medicare Other | Admitting: Hematology & Oncology

## 2019-08-06 ENCOUNTER — Encounter: Payer: Self-pay | Admitting: Family

## 2019-08-06 ENCOUNTER — Inpatient Hospital Stay: Payer: Medicare Other

## 2019-08-06 ENCOUNTER — Telehealth: Payer: Self-pay | Admitting: Hematology & Oncology

## 2019-08-06 ENCOUNTER — Inpatient Hospital Stay: Payer: Medicare Other | Attending: Hematology & Oncology

## 2019-08-06 ENCOUNTER — Inpatient Hospital Stay (HOSPITAL_BASED_OUTPATIENT_CLINIC_OR_DEPARTMENT_OTHER): Payer: Medicare Other | Admitting: Family

## 2019-08-06 ENCOUNTER — Ambulatory Visit: Payer: Medicare Other | Admitting: Nutrition

## 2019-08-06 ENCOUNTER — Other Ambulatory Visit: Payer: Self-pay

## 2019-08-06 VITALS — BP 139/83 | HR 84 | Temp 97.5°F | Resp 17 | Ht 65.0 in | Wt 76.0 lb

## 2019-08-06 DIAGNOSIS — M25552 Pain in left hip: Secondary | ICD-10-CM

## 2019-08-06 DIAGNOSIS — C672 Malignant neoplasm of lateral wall of bladder: Secondary | ICD-10-CM

## 2019-08-06 DIAGNOSIS — I10 Essential (primary) hypertension: Secondary | ICD-10-CM

## 2019-08-06 DIAGNOSIS — C779 Secondary and unspecified malignant neoplasm of lymph node, unspecified: Secondary | ICD-10-CM | POA: Insufficient documentation

## 2019-08-06 DIAGNOSIS — Z79899 Other long term (current) drug therapy: Secondary | ICD-10-CM | POA: Insufficient documentation

## 2019-08-06 DIAGNOSIS — Z5112 Encounter for antineoplastic immunotherapy: Secondary | ICD-10-CM | POA: Diagnosis present

## 2019-08-06 DIAGNOSIS — C670 Malignant neoplasm of trigone of bladder: Secondary | ICD-10-CM

## 2019-08-06 DIAGNOSIS — D500 Iron deficiency anemia secondary to blood loss (chronic): Secondary | ICD-10-CM | POA: Diagnosis not present

## 2019-08-06 DIAGNOSIS — E039 Hypothyroidism, unspecified: Secondary | ICD-10-CM

## 2019-08-06 LAB — CBC WITH DIFFERENTIAL (CANCER CENTER ONLY)
Abs Immature Granulocytes: 0.01 10*3/uL (ref 0.00–0.07)
Basophils Absolute: 0 10*3/uL (ref 0.0–0.1)
Basophils Relative: 1 %
Eosinophils Absolute: 0.1 10*3/uL (ref 0.0–0.5)
Eosinophils Relative: 2 %
HCT: 39.6 % (ref 36.0–46.0)
Hemoglobin: 13.2 g/dL (ref 12.0–15.0)
Immature Granulocytes: 0 %
Lymphocytes Relative: 28 %
Lymphs Abs: 1 10*3/uL (ref 0.7–4.0)
MCH: 31.9 pg (ref 26.0–34.0)
MCHC: 33.3 g/dL (ref 30.0–36.0)
MCV: 95.7 fL (ref 80.0–100.0)
Monocytes Absolute: 0.4 10*3/uL (ref 0.1–1.0)
Monocytes Relative: 10 %
Neutro Abs: 2.2 10*3/uL (ref 1.7–7.7)
Neutrophils Relative %: 59 %
Platelet Count: 182 10*3/uL (ref 150–400)
RBC: 4.14 MIL/uL (ref 3.87–5.11)
RDW: 13.4 % (ref 11.5–15.5)
WBC Count: 3.7 10*3/uL — ABNORMAL LOW (ref 4.0–10.5)
nRBC: 0 % (ref 0.0–0.2)

## 2019-08-06 LAB — CMP (CANCER CENTER ONLY)
ALT: 10 U/L (ref 0–44)
AST: 14 U/L — ABNORMAL LOW (ref 15–41)
Albumin: 4.3 g/dL (ref 3.5–5.0)
Alkaline Phosphatase: 86 U/L (ref 38–126)
Anion gap: 6 (ref 5–15)
BUN: 16 mg/dL (ref 8–23)
CO2: 32 mmol/L (ref 22–32)
Calcium: 10 mg/dL (ref 8.9–10.3)
Chloride: 99 mmol/L (ref 98–111)
Creatinine: 1 mg/dL (ref 0.44–1.00)
GFR, Est AFR Am: >60 mL/min (ref >60)
GFR, Estimated: 60 mL/min — ABNORMAL LOW (ref >60)
Glucose, Bld: 92 mg/dL (ref 70–99)
Potassium: 3.7 mmol/L (ref 3.5–5.1)
Sodium: 137 mmol/L (ref 135–145)
Total Bilirubin: 0.4 mg/dL (ref 0.3–1.2)
Total Protein: 6.8 g/dL (ref 6.5–8.1)

## 2019-08-06 LAB — LACTATE DEHYDROGENASE: LDH: 204 U/L — ABNORMAL HIGH (ref 98–192)

## 2019-08-06 MED ORDER — HEPARIN SOD (PORK) LOCK FLUSH 100 UNIT/ML IV SOLN
500.0000 [IU] | Freq: Once | INTRAVENOUS | Status: AC | PRN
  Administered 2019-08-06: 14:00:00 500 [IU]
  Filled 2019-08-06: qty 5

## 2019-08-06 MED ORDER — SODIUM CHLORIDE 0.9% FLUSH
10.0000 mL | INTRAVENOUS | Status: DC | PRN
  Administered 2019-08-06: 14:00:00 10 mL
  Filled 2019-08-06: qty 10

## 2019-08-06 MED ORDER — AMLODIPINE BESYLATE 10 MG PO TABS: 10.0000 mg | ORAL_TABLET | Freq: Every day | ORAL | 0 refills | Status: DC

## 2019-08-06 MED ORDER — SODIUM CHLORIDE 0.9 % IV SOLN
Freq: Once | INTRAVENOUS | Status: AC
  Filled 2019-08-06: qty 250

## 2019-08-06 MED ORDER — SODIUM CHLORIDE 0.9 % IV SOLN
400.0000 mg | Freq: Once | INTRAVENOUS | Status: AC
  Administered 2019-08-06: 400 mg via INTRAVENOUS
  Filled 2019-08-06: qty 16

## 2019-08-06 NOTE — Telephone Encounter (Signed)
Appointments scheduled updated calendar was printed and given to patient per 1/12 los

## 2019-08-06 NOTE — Progress Notes (Signed)
Hematology and Oncology Follow Up Visit  RUFUS CYPERT [id number] [birth year] 64 y.o. 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 6 week --s/pcycle 6-- starton 11/14/2018 -- changed on 03/06/2019   Interim History:  Ms. Hottel is here today for follow-up and treatment. She states that she is still having persistent left hip pain unaffected by the oxycodone.  No falls or syncope.  No weakness, swelling, tenderness, numbness or tingling in her extremities.  No fever, chills, n/v, rash, dizziness, chest pain, palpitations, abdominal pain or changes in bowel or bladder habits.  She has a productive cough (clear sputum) and mild SOB with exertion due to COPD.  She states that she is eating well and staying hydrated. Her weight is stable.   ECOG Performance Status: 1 - Symptomatic but completely ambulatory  Medications:  Allergies as of 08/06/2019   No Known Allergies     Medication List       Accurate as of August 06, 2019 10:58 AM. If you have any questions, ask your nurse or doctor.        acetaminophen 500 MG tablet Commonly known as: TYLENOL Take 1,000 mg by mouth every 8 (eight) hours as needed for moderate pain.   albuterol (2.5 MG/3ML) 0.083% nebulizer solution Commonly known as: PROVENTIL Take 3 mLs (2.5 mg total) by nebulization every 4 (four) hours as needed for wheezing or shortness of breath.   albuterol 108 (90 Base) MCG/ACT inhaler Commonly known as: VENTOLIN HFA Inhale 2 puffs into the lungs every 6 (six) hours as needed for wheezing or shortness of breath.   albuterol (2.5 MG/3ML) 0.083% nebulizer solution Commonly known as: PROVENTIL Take 3 mLs (2.5 mg total) by nebulization every 6 (six) hours as  needed for wheezing or shortness of breath.   amitriptyline 10 MG tablet Commonly known as: ELAVIL Take 1 tablet (10 mg total) by mouth at bedtime as needed for sleep.   amLODipine 10 MG tablet Commonly known as: NORVASC Take 1 tablet (10 mg total) by mouth daily.   aspirin EC 81 MG tablet Take 81 mg by mouth daily.   citalopram 10 MG tablet Commonly known as: CeleXA Take 1 tablet (10 mg total) by mouth daily.   dronabinol 2.5 MG capsule Commonly known as: MARINOL Take 2 capsules (5 mg total) by mouth 2 (two) times daily before a meal.   lidocaine-prilocaine cream Commonly known as: EMLA Apply to affected area once   lidocaine-prilocaine cream Commonly known as: EMLA Apply 1 application topically as needed.   megestrol 20 MG tablet Commonly known as: MEGACE Take 1 tablet (20 mg total) by mouth daily.   ondansetron 4 MG disintegrating tablet Commonly known as: Zofran ODT Take 1 tablet (4 mg total) by mouth every 8 (eight) hours as needed for nausea or vomiting.   ondansetron 8 MG tablet Commonly known as: ZOFRAN Take 1 tablet (8 mg total) by mouth every 8 (eight) hours as needed for nausea or vomiting.   oxyCODONE 5 MG immediate release tablet Commonly known as: Oxy IR/ROXICODONE Take 1 tablet (5 mg total) by mouth every 6 (six) hours as needed for severe pain.   prochlorperazine 10 MG tablet Commonly known as: COMPAZINE Take 1 tablet (10 mg total) by mouth every 6 (six) hours as  needed for nausea or vomiting.   Trelegy Ellipta 100-62.5-25 MCG/INH Aepb Generic drug: Fluticasone-Umeclidin-Vilant Inhale 1 puff into the lungs 2 (two) times daily.   Vitamin D (Ergocalciferol) 1.25 MG (50000 UNIT) Caps capsule Commonly known as: DRISDOL Take 1 capsule by mouth once a week       Allergies: No Known Allergies  Past Medical History, Surgical history, Social history, and Family History were reviewed and updated.  Review of Systems: All other 10 point review of  systems is negative.   Physical Exam:  vitals were not taken for this visit.   Wt Readings from Last 3 Encounters:  06/13/19 74 lb (33.6 kg)  05/13/19 98 lb (44.5 kg)  04/29/19 75 lb (34 kg)    Ocular: Sclerae unicteric, pupils equal, round and reactive to light Ear-nose-throat: Oropharynx clear, dentition fair Lymphatic: No cervical or supraclavicular adenopathy Lungs no rales or rhonchi, good excursion bilaterally Heart regular rate and rhythm, no murmur appreciated Abd soft, nontender, positive bowel sounds, no liver or spleen tip palpated on exam, no fluid wave  MSK no focal spinal tenderness, no joint edema Neuro: non-focal, well-oriented, appropriate affect Breasts: Deferred   Lab Results  Component Value Date   WBC 3.7 (L) 08/06/2019   HGB 13.2 08/06/2019   HCT 39.6 08/06/2019   MCV 95.7 08/06/2019   PLT 182 08/06/2019   Lab Results  Component Value Date   FERRITIN 10 (L) 12/26/2018   IRON 34 (L) 12/26/2018   TIBC 353 12/26/2018   UIBC 319 12/26/2018   IRONPCTSAT 10 (L) 12/26/2018   Lab Results  Component Value Date   RBC 4.14 08/06/2019   No results found for: KPAFRELGTCHN, LAMBDASER, KAPLAMBRATIO No results found for: IGGSERUM, IGA, IGMSERUM No results found for: [name], SPEI   Chemistry      Component Value Date/Time   NA 142 06/13/2019 1042   K 4.0 06/13/2019 1042   CL 106 06/13/2019 1042   CO2 29 06/13/2019 1042   BUN 13 06/13/2019 1042   CREATININE 0.87 06/13/2019 1042   CREATININE 1.10 08/20/2013 1027      Component Value Date/Time   CALCIUM 9.8 06/13/2019 1042   ALKPHOS 75 06/13/2019 1042   AST 15 06/13/2019 1042   ALT 15 06/13/2019 1042   BILITOT 0.4 06/13/2019 1042       Impression and Plan: Ms. Dagostino is a very pleasant 65 yo African American female with 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 We will get an xray of the left hip today to  assess for cause of pain.  We will proceed with treatment today as planned.  PET scan in 3 weeks.  We will see her back in another 6 weeks.  She will contact our office with any questions or concerns. We can certainly see her sooner if needed.   Laverna Peace, NP 1/12/202110:58 AM

## 2019-08-06 NOTE — Progress Notes (Signed)
64 year old female diagnosed with Urothelial Cancer of the Bladder and Iron Deficiency Anemia. She is a patient of [name].  PMH includes Tobacco, TIA, HTN, and Asthma.  Medications include Marinol, Megace, Zofran, and Compazine.  Labs were reviewed.  Height: 5'5". Weight: 76 pounds. UBW: ~73 pounds. BMI:12.65.  Patient is having pain in her side. She denies nutrition impact symptoms.  States she is drinking Ensure and likes it.  Appetite is good.  Nutrition Diagnosis: Severe Malnutrition related to cancer as evidenced by BMI, loss of fat and muscle on physical exam.  Intervention: Educated patient to increase Ensure to Ensure Enlive TID between meals. Provided complimentary case of Ensure Enlive and food bag from the pantry. Encouraged increased calories and protein at meal times.  Monitoring, Evaluation, Goals: Patient will increase calories and protein to minimize weight loss.  Next Visit: To be scheduled as needed.

## 2019-08-07 ENCOUNTER — Ambulatory Visit (HOSPITAL_BASED_OUTPATIENT_CLINIC_OR_DEPARTMENT_OTHER)
Admission: RE | Admit: 2019-08-07 | Discharge: 2019-08-07 | Disposition: A | Discharge status: Home / Self Care | Payer: Medicare Other | Source: Ambulatory Visit | Attending: Family | Admitting: Family

## 2019-08-07 ENCOUNTER — Other Ambulatory Visit: Payer: Self-pay | Admitting: Family

## 2019-08-07 DIAGNOSIS — C672 Malignant neoplasm of lateral wall of bladder: Secondary | ICD-10-CM | POA: Diagnosis present

## 2019-08-07 DIAGNOSIS — M25552 Pain in left hip: Secondary | ICD-10-CM

## 2019-08-07 LAB — TSH: TSH: 0.814 u[IU]/mL (ref 0.308–3.960)

## 2019-08-08 ENCOUNTER — Telehealth: Payer: Self-pay | Admitting: *Deleted

## 2019-08-08 NOTE — Telephone Encounter (Signed)
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 No cancer!!  Donna Curry

## 2019-08-08 NOTE — Telephone Encounter (Signed)
As noted below by [name], I informed her that there is bad arthritis in her hips and spine. There is no cancer. She verbalized understanding.

## 2019-08-21 ENCOUNTER — Other Ambulatory Visit: Payer: Self-pay | Admitting: *Deleted

## 2019-08-21 DIAGNOSIS — C670 Malignant neoplasm of trigone of bladder: Secondary | ICD-10-CM

## 2019-08-21 MED ORDER — OXYCODONE HCL 5 MG PO TABS: 5.0000 mg | ORAL_TABLET | Freq: Four times a day (QID) | ORAL | 0 refills | Status: DC | PRN

## 2019-08-27 ENCOUNTER — Ambulatory Visit (HOSPITAL_COMMUNITY): Admission: RE | Admit: 2019-08-27 | Payer: Medicare Other | Source: Ambulatory Visit

## 2019-08-27 ENCOUNTER — Ambulatory Visit: Payer: Medicare Other | Admitting: Family

## 2019-09-04 ENCOUNTER — Ambulatory Visit (HOSPITAL_COMMUNITY): Payer: Medicare Other

## 2019-09-04 DIAGNOSIS — J189 Pneumonia, unspecified organism: Secondary | ICD-10-CM | POA: Diagnosis not present

## 2019-09-04 DIAGNOSIS — J439 Emphysema, unspecified: Secondary | ICD-10-CM | POA: Diagnosis not present

## 2019-09-04 DIAGNOSIS — J449 Chronic obstructive pulmonary disease, unspecified: Secondary | ICD-10-CM | POA: Diagnosis not present

## 2019-09-16 ENCOUNTER — Other Ambulatory Visit: Payer: Self-pay

## 2019-09-16 ENCOUNTER — Ambulatory Visit (HOSPITAL_COMMUNITY)
Admission: RE | Admit: 2019-09-16 | Discharge: 2019-09-16 | Disposition: A | Discharge status: Home / Self Care | Payer: Medicare Other | Source: Ambulatory Visit | Attending: Family | Admitting: Family

## 2019-09-16 DIAGNOSIS — I2510 Atherosclerotic heart disease of native coronary artery without angina pectoris: Secondary | ICD-10-CM | POA: Insufficient documentation

## 2019-09-16 DIAGNOSIS — R911 Solitary pulmonary nodule: Secondary | ICD-10-CM | POA: Diagnosis not present

## 2019-09-16 DIAGNOSIS — C672 Malignant neoplasm of lateral wall of bladder: Secondary | ICD-10-CM | POA: Insufficient documentation

## 2019-09-16 DIAGNOSIS — J439 Emphysema, unspecified: Secondary | ICD-10-CM | POA: Insufficient documentation

## 2019-09-16 DIAGNOSIS — M25552 Pain in left hip: Secondary | ICD-10-CM | POA: Insufficient documentation

## 2019-09-16 DIAGNOSIS — I700 Atherosclerosis of aorta: Secondary | ICD-10-CM | POA: Diagnosis not present

## 2019-09-16 DIAGNOSIS — C679 Malignant neoplasm of bladder, unspecified: Secondary | ICD-10-CM | POA: Diagnosis not present

## 2019-09-16 LAB — GLUCOSE, CAPILLARY: Glucose-Capillary: 95 mg/dL (ref 70–99)

## 2019-09-16 MED ORDER — FLUDEOXYGLUCOSE F - 18 (FDG) INJECTION
4.9700 | Freq: Once | INTRAVENOUS | Status: AC
  Administered 2019-09-16: 4.97 via INTRAVENOUS

## 2019-09-17 ENCOUNTER — Inpatient Hospital Stay: Payer: Medicare Other

## 2019-09-17 ENCOUNTER — Inpatient Hospital Stay: Payer: Medicare Other | Attending: Hematology & Oncology

## 2019-09-17 ENCOUNTER — Encounter: Payer: Self-pay | Admitting: Nutrition

## 2019-09-17 ENCOUNTER — Encounter: Payer: Self-pay | Admitting: Hematology & Oncology

## 2019-09-17 ENCOUNTER — Inpatient Hospital Stay (HOSPITAL_BASED_OUTPATIENT_CLINIC_OR_DEPARTMENT_OTHER): Payer: Medicare Other | Admitting: Hematology & Oncology

## 2019-09-17 DIAGNOSIS — C673 Malignant neoplasm of anterior wall of bladder: Secondary | ICD-10-CM | POA: Diagnosis not present

## 2019-09-17 DIAGNOSIS — C670 Malignant neoplasm of trigone of bladder: Secondary | ICD-10-CM

## 2019-09-17 DIAGNOSIS — C672 Malignant neoplasm of lateral wall of bladder: Secondary | ICD-10-CM | POA: Diagnosis not present

## 2019-09-17 DIAGNOSIS — C779 Secondary and unspecified malignant neoplasm of lymph node, unspecified: Secondary | ICD-10-CM | POA: Diagnosis not present

## 2019-09-17 DIAGNOSIS — Z5112 Encounter for antineoplastic immunotherapy: Secondary | ICD-10-CM | POA: Insufficient documentation

## 2019-09-17 DIAGNOSIS — Z79899 Other long term (current) drug therapy: Secondary | ICD-10-CM | POA: Insufficient documentation

## 2019-09-17 LAB — CMP (CANCER CENTER ONLY)
ALT: 12 U/L (ref 0–44)
AST: 14 U/L — ABNORMAL LOW (ref 15–41)
Albumin: 4.4 g/dL (ref 3.5–5.0)
Alkaline Phosphatase: 87 U/L (ref 38–126)
Anion gap: 7 (ref 5–15)
BUN: 22 mg/dL (ref 8–23)
CO2: 29 mmol/L (ref 22–32)
Calcium: 10.1 mg/dL (ref 8.9–10.3)
Chloride: 105 mmol/L (ref 98–111)
Creatinine: 1.01 mg/dL — ABNORMAL HIGH (ref 0.44–1.00)
GFR, Est AFR Am: >60 mL/min (ref >60)
GFR, Estimated: 59 mL/min — ABNORMAL LOW (ref >60)
Glucose, Bld: 93 mg/dL (ref 70–99)
Potassium: 3.8 mmol/L (ref 3.5–5.1)
Sodium: 141 mmol/L (ref 135–145)
Total Bilirubin: 0.3 mg/dL (ref 0.3–1.2)
Total Protein: 7.4 g/dL (ref 6.5–8.1)

## 2019-09-17 LAB — CBC WITH DIFFERENTIAL (CANCER CENTER ONLY)
Abs Immature Granulocytes: 0.01 10*3/uL (ref 0.00–0.07)
Basophils Absolute: 0 10*3/uL (ref 0.0–0.1)
Basophils Relative: 1 %
Eosinophils Absolute: 0.2 10*3/uL (ref 0.0–0.5)
Eosinophils Relative: 4 %
HCT: 40.4 % (ref 36.0–46.0)
Hemoglobin: 13.6 g/dL (ref 12.0–15.0)
Immature Granulocytes: 0 %
Lymphocytes Relative: 32 %
Lymphs Abs: 1.3 10*3/uL (ref 0.7–4.0)
MCH: 32.2 pg (ref 26.0–34.0)
MCHC: 33.7 g/dL (ref 30.0–36.0)
MCV: 95.5 fL (ref 80.0–100.0)
Monocytes Absolute: 0.6 10*3/uL (ref 0.1–1.0)
Monocytes Relative: 14 %
Neutro Abs: 1.9 10*3/uL (ref 1.7–7.7)
Neutrophils Relative %: 49 %
Platelet Count: 188 10*3/uL (ref 150–400)
RBC: 4.23 MIL/uL (ref 3.87–5.11)
RDW: 13.2 % (ref 11.5–15.5)
WBC Count: 4 10*3/uL (ref 4.0–10.5)
nRBC: 0 % (ref 0.0–0.2)

## 2019-09-17 LAB — LACTATE DEHYDROGENASE: LDH: 207 U/L — ABNORMAL HIGH (ref 98–192)

## 2019-09-17 MED ORDER — SODIUM CHLORIDE 0.9% FLUSH
10.0000 mL | INTRAVENOUS | Status: DC | PRN
  Administered 2019-09-17: 10 mL
  Filled 2019-09-17: qty 10

## 2019-09-17 MED ORDER — SODIUM CHLORIDE 0.9 % IV SOLN
Freq: Once | INTRAVENOUS | Status: AC
  Filled 2019-09-17: qty 250

## 2019-09-17 MED ORDER — OXYCODONE HCL 5 MG PO TABS: 5.0000 mg | ORAL_TABLET | Freq: Four times a day (QID) | ORAL | 0 refills | Status: DC | PRN

## 2019-09-17 MED ORDER — SODIUM CHLORIDE 0.9 % IV SOLN
400.0000 mg | Freq: Once | INTRAVENOUS | Status: AC
  Administered 2019-09-17: 400 mg via INTRAVENOUS
  Filled 2019-09-17: qty 16

## 2019-09-17 MED ORDER — DRONABINOL 5 MG PO CAPS: 5.0000 mg | ORAL_CAPSULE | Freq: Two times a day (BID) | ORAL | 0 refills | Status: DC

## 2019-09-17 MED ORDER — HEPARIN SOD (PORK) LOCK FLUSH 100 UNIT/ML IV SOLN
500.0000 [IU] | Freq: Once | INTRAVENOUS | Status: AC | PRN
  Administered 2019-09-17: 500 [IU]
  Filled 2019-09-17: qty 5

## 2019-09-17 NOTE — Patient Instructions (Signed)
Implanted Port Insertion, Care After °This sheet gives you information about how to care for yourself after your procedure. Your health care provider may also give you more specific instructions. If you have problems or questions, contact your health care provider. °What can I expect after the procedure? °After the procedure, it is common to have: °· Discomfort at the port insertion site. °· Bruising on the skin over the port. This should improve over 3-4 days. °Follow these instructions at home: °Port care °· After your port is placed, you will get a manufacturer's information card. The card has information about your port. Keep this card with you at all times. °· Take care of the port as told by your health care provider. Ask your health care provider if you or a family member can get training for taking care of the port at home. A home health care nurse may also take care of the port. °· Make sure to remember what type of port you have. °Incision care ° °  ° °· Follow instructions from your health care provider about how to take care of your port insertion site. Make sure you: °? Wash your hands with soap and water before and after you change your bandage (dressing). If soap and water are not available, use hand sanitizer. °? Change your dressing as told by your health care provider. °? Leave stitches (sutures), skin glue, or adhesive strips in place. These skin closures may need to stay in place for 2 weeks or longer. If adhesive strip edges start to loosen and curl up, you may trim the loose edges. Do not remove adhesive strips completely unless your health care provider tells you to do that. °· Check your port insertion site every day for signs of infection. Check for: °? Redness, swelling, or pain. °? Fluid or blood. °? Warmth. °? Pus or a bad smell. °Activity °· Return to your normal activities as told by your health care provider. Ask your health care provider what activities are safe for you. °· Do not  lift anything that is heavier than 10 lb (4.5 kg), or the limit that you are told, until your health care provider says that it is safe. °General instructions °· Take over-the-counter and prescription medicines only as told by your health care provider. °· Do not take baths, swim, or use a hot tub until your health care provider approves. Ask your health care provider if you may take showers. You may only be allowed to take sponge baths. °· Do not drive for 24 hours if you were given a sedative during your procedure. °· Wear a medical alert bracelet in case of an emergency. This will tell any health care providers that you have a port. °· Keep all follow-up visits as told by your health care provider. This is important. °Contact a health care provider if: °· You cannot flush your port with saline as directed, or you cannot draw blood from the port. °· You have a fever or chills. °· You have redness, swelling, or pain around your port insertion site. °· You have fluid or blood coming from your port insertion site. °· Your port insertion site feels warm to the touch. °· You have pus or a bad smell coming from the port insertion site. °Get help right away if: °· You have chest pain or shortness of breath. °· You have bleeding from your port that you cannot control. °Summary °· Take care of the port as told by your health   care provider. Keep the manufacturer's information card with you at all times. °· Change your dressing as told by your health care provider. °· Contact a health care provider if you have a fever or chills or if you have redness, swelling, or pain around your port insertion site. °· Keep all follow-up visits as told by your health care provider. °This information is not intended to replace advice given to you by your health care provider. Make sure you discuss any questions you have with your health care provider. °Document Revised: 02/06/2018 Document Reviewed: 02/06/2018 °Elsevier Patient Education ©  2020 [employer] ° °

## 2019-09-17 NOTE — Progress Notes (Signed)
Provided one complimentary case of Ensure Enlive.

## 2019-09-17 NOTE — Progress Notes (Signed)
Hematology and Oncology Follow Up Visit  Donna Curry [id number] August 27, 1955 64 y.o. 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 6 week --s/pcycle #8-- starton 11/14/2018 -- changed on 03/06/2019   Interim History:  Donna Curry is here today for follow-up and treatment.  Overall, I must say that she does look quite good.  She is holding her weight..  She still is complaining of a lot of pain in the left hip.  This is not cancer pain.  This is arthritis pain.  I told her that her family doctor can help with this.  She did have some x-rays done recently.  And there was hip arthrosis noted.  She did have a PET scan done on February 22.  This did show a new nodule in the right upper lung.  This measured 9 x 8 mm.  It had an SUV of only 1.3.  Given that she has a past history of heavy tobacco use, this will have to be watched closely.  I suspect if this is malignant that is probably a primary lung cancer.  Otherwise, the PET scan really did not show anything that looked suspicious for recurrent or metastatic disease.  She is eating okay.  She is having no nausea or vomiting.  She is having no obvious change in bowel or bladder habits.  Overall, her performance status is ECOG 1-2.    Medications:  Allergies as of 09/17/2019   No Known Allergies     Medication List       Accurate as of September 17, 2019 10:49 AM. If you have any questions, ask your nurse or doctor.        acetaminophen 500 MG tablet Commonly known as: TYLENOL Take 1,000 mg by mouth every 8 (eight) hours as needed for moderate pain.   albuterol 108 (90 Base) MCG/ACT inhaler Commonly known as: VENTOLIN HFA Inhale 2 puffs into the lungs every 6 (six) hours as needed  for wheezing or shortness of breath.   albuterol (2.5 MG/3ML) 0.083% nebulizer solution Commonly known as: PROVENTIL Take 3 mLs (2.5 mg total) by nebulization every 6 (six) hours as needed for wheezing or shortness of breath.   amitriptyline 10 MG tablet Commonly known as: ELAVIL Take 1 tablet (10 mg total) by mouth at bedtime as needed for sleep.   amLODipine 10 MG tablet Commonly known as: NORVASC Take 1 tablet (10 mg total) by mouth daily.   aspirin EC 81 MG tablet Take 81 mg by mouth daily.   citalopram 10 MG tablet Commonly known as: CeleXA Take 1 tablet (10 mg total) by mouth daily.   dronabinol 5 MG capsule Commonly known as: MARINOL Take 1 capsule (5 mg total) by mouth 2 (two) times daily before lunch and supper. What changed:   medication strength  when to take this Changed by: [name], MD   lidocaine-prilocaine cream Commonly known as: EMLA Apply to affected area once   megestrol 20 MG tablet Commonly known as: MEGACE Take 1 tablet (20 mg total) by mouth daily.   ondansetron 4 MG disintegrating tablet Commonly known as: Zofran ODT Take 1 tablet (4 mg total) by mouth every 8 (eight) hours as needed for nausea or vomiting.   ondansetron 8 MG tablet Commonly known  as: ZOFRAN Take 1 tablet (8 mg total) by mouth every 8 (eight) hours as needed for nausea or vomiting.   oxyCODONE 5 MG immediate release tablet Commonly known as: Oxy IR/ROXICODONE Take 1 tablet (5 mg total) by mouth every 6 (six) hours as needed for severe pain.   prochlorperazine 10 MG tablet Commonly known as: COMPAZINE Take 1 tablet (10 mg total) by mouth every 6 (six) hours as needed for nausea or vomiting.   Trelegy Ellipta 100-62.5-25 MCG/INH Aepb Generic drug: Fluticasone-Umeclidin-Vilant Inhale 1 puff into the lungs 2 (two) times daily.   Vitamin D (Ergocalciferol) 1.25 MG (50000 UNIT) Caps capsule Commonly known as: DRISDOL Take 1 capsule by mouth once a week        Allergies: No Known Allergies  Past Medical History, Surgical history, Social history, and Family History were reviewed and updated.  Review of Systems: Review of Systems  Constitutional: Negative.   HENT: Negative.   Eyes: Negative.   Respiratory: Negative.   Cardiovascular: Negative.   Gastrointestinal: Negative.   Genitourinary: Negative.   Musculoskeletal: Positive for joint pain.  Skin: Negative.   Neurological: Negative.   Endo/Heme/Allergies: Negative.   Psychiatric/Behavioral: Negative.       Physical Exam:  weight is 74 lb (33.6 kg). Her temporal temperature is 97.3 F (36.3 C) (abnormal). Her blood pressure is 116/72 and her pulse is 92. Her respiration is 20 and oxygen saturation is 99%.   Wt Readings from Last 3 Encounters:  09/17/19 74 lb (33.6 kg)  08/06/19 76 lb (34.5 kg)  06/13/19 74 lb (33.6 kg)      Physical Activity:   . Days of Exercise per Week: Not on file  . Minutes of Exercise per Session: Not on file    Physical Exam Vitals reviewed.  HENT:     Head: Normocephalic and atraumatic.  Eyes:     Pupils: Pupils are equal, round, and reactive to light.  Cardiovascular:     Rate and Rhythm: Normal rate and regular rhythm.     Heart sounds: Normal heart sounds.  Pulmonary:     Effort: Pulmonary effort is normal.     Breath sounds: Normal breath sounds.  Abdominal:     General: Bowel sounds are normal.     Palpations: Abdomen is soft.  Musculoskeletal:        General: No tenderness or deformity. Normal range of motion.     Cervical back: Normal range of motion.  Lymphadenopathy:     Cervical: No cervical adenopathy.  Skin:    General: Skin is warm and dry.     Findings: No erythema or rash.  Neurological:     Mental Status: She is alert and oriented to person, place, and time.  Psychiatric:        Behavior: Behavior normal.        Thought Content: Thought content normal.        Judgment: Judgment normal.      Lab Results   Component Value Date   WBC 4.0 09/17/2019   HGB 13.6 09/17/2019   HCT 40.4 09/17/2019   MCV 95.5 09/17/2019   PLT 188 09/17/2019   Lab Results  Component Value Date   FERRITIN 10 (L) 12/26/2018   IRON 34 (L) 12/26/2018   TIBC 353 12/26/2018   UIBC 319 12/26/2018   IRONPCTSAT 10 (L) 12/26/2018   Lab Results  Component Value Date   RBC 4.23 09/17/2019   No results found for: KPAFRELGTCHN, LAMBDASER, KAPLAMBRATIO No  results found for: IGGSERUM, IGA, IGMSERUM No results found for: [name], SPEI   Chemistry      Component Value Date/Time   NA 141 09/17/2019 0912   K 3.8 09/17/2019 0912   CL 105 09/17/2019 0912   CO2 29 09/17/2019 0912   BUN 22 09/17/2019 0912   CREATININE 1.01 (H) 09/17/2019 0912   CREATININE 1.10 08/20/2013 1027      Component Value Date/Time   CALCIUM 10.1 09/17/2019 0912   ALKPHOS 87 09/17/2019 0912   AST 14 (L) 09/17/2019 0912   ALT 12 09/17/2019 0912   BILITOT 0.3 09/17/2019 0912       Impression and Plan: Ms. 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  I think she is doing quite well on immunotherapy.  I still feel that what is in the lung might be a primary lung cancer.  I probably would have to do another PET scan on her in about 3 months.  If the PET scan shows a growth, then I think she would be a candidate for SRS.  We will plan to get her back to see [place] in another 6 weeks.  The 6-week intervals for treatment were got well for her.  I told her that she is going to have to talk to her family doctor about the left hip.  We will not adjust her pain medication for this.    [name], MD 2/23/202110:49 AM

## 2019-09-17 NOTE — Patient Instructions (Signed)
Pembrolizumab injection What is this medicine? PEMBROLIZUMAB (pem broe liz ue mab) is a monoclonal antibody. It is used to treat certain types of cancer. This medicine may be used for other purposes; ask your health care provider or pharmacist if you have questions. COMMON BRAND NAME(S): Keytruda What should I tell my health care provider before I take this medicine? They need to know if you have any of these conditions:  diabetes  immune system problems  inflammatory bowel disease  liver disease  lung or breathing disease  lupus  received or scheduled to receive an organ transplant or a stem-cell transplant that uses donor stem cells  an unusual or allergic reaction to pembrolizumab, other medicines, foods, dyes, or preservatives  pregnant or trying to get pregnant  breast-feeding How should I use this medicine? This medicine is for infusion into a vein. It is given by a health care professional in a hospital or clinic setting. A special MedGuide will be given to you before each treatment. Be sure to read this information carefully each time. Talk to your pediatrician regarding the use of this medicine in children. While this drug may be prescribed for children as young as 6 months for selected conditions, precautions do apply. Overdosage: If you think you have taken too much of this medicine contact a poison control center or emergency room at once. NOTE: This medicine is only for you. Do not share this medicine with others. What if I miss a dose? It is important not to miss your dose. Call your doctor or health care professional if you are unable to keep an appointment. What may interact with this medicine? Interactions have not been studied. Give your health care provider a list of all the medicines, herbs, non-prescription drugs, or dietary supplements you use. Also tell them if you smoke, drink alcohol, or use illegal drugs. Some items may interact with your medicine. This  list may not describe all possible interactions. Give your health care provider a list of all the medicines, herbs, non-prescription drugs, or dietary supplements you use. Also tell them if you smoke, drink alcohol, or use illegal drugs. Some items may interact with your medicine. What should I watch for while using this medicine? Your condition will be monitored carefully while you are receiving this medicine. You may need blood work done while you are taking this medicine. Do not become pregnant while taking this medicine or for 4 months after stopping it. Women should inform their doctor if they wish to become pregnant or think they might be pregnant. There is a potential for serious side effects to an unborn child. Talk to your health care professional or pharmacist for more information. Do not breast-feed an infant while taking this medicine or for 4 months after the last dose. What side effects may I notice from receiving this medicine? Side effects that you should report to your doctor or health care professional as soon as possible:  allergic reactions like skin rash, itching or hives, swelling of the face, lips, or tongue  bloody or black, tarry  breathing problems  changes in vision  chest pain  chills  confusion  constipation  cough  diarrhea  dizziness or feeling faint or lightheaded  fast or irregular heartbeat  fever  flushing  joint pain  low blood counts - this medicine may decrease the number of white blood cells, red blood cells and platelets. You may be at increased risk for infections and bleeding.  muscle pain  muscle   weakness  pain, tingling, numbness in the hands or feet  persistent headache  redness, blistering, peeling or loosening of the skin, including inside the mouth  signs and symptoms of high blood sugar such as dizziness; dry mouth; dry skin; fruity breath; nausea; stomach pain; increased hunger or thirst; increased urination  signs  and symptoms of kidney injury like trouble passing urine or change in the amount of urine  signs and symptoms of liver injury like dark urine, light-colored stools, loss of appetite, nausea, right upper belly pain, yellowing of the eyes or skin  sweating  swollen lymph nodes  weight loss Side effects that usually do not require medical attention (report to your doctor or health care professional if they continue or are bothersome):  decreased appetite  hair loss  muscle pain  tiredness This list may not describe all possible side effects. Call your doctor for medical advice about side effects. You may report side effects to FDA at 1-800-FDA-1088. Where should I keep my medicine? This drug is given in a hospital or clinic and will not be stored at home. NOTE: This sheet is a summary. It may not cover all possible information. If you have questions about this medicine, talk to your doctor, pharmacist, or health care provider.  2020 Elsevier/Gold Standard (2019-05-17 18:07:58)  

## 2019-09-30 ENCOUNTER — Other Ambulatory Visit: Payer: Self-pay | Admitting: Family

## 2019-10-22 NOTE — Progress Notes (Unsigned)
Pharmacist Chemotherapy Monitoring - Follow Up Assessment    I verify that I have reviewed each item in the below checklist:  . Regimen for the patient is scheduled for the appropriate day and plan matches scheduled date. Marland Kitchen Appropriate non-routine labs are ordered dependent on drug ordered. . If applicable, additional medications reviewed and ordered per protocol based on lifetime cumulative doses and/or treatment regimen.   Plan for follow-up and/or issues identified: No . I-vent associated with next due treatment: No . MD and/or nursing notified: No  Saafir Abdullah, Jacqlyn Larsen 10/22/2019 1:11 PM

## 2019-10-23 ENCOUNTER — Other Ambulatory Visit: Payer: Self-pay

## 2019-10-23 DIAGNOSIS — C672 Malignant neoplasm of lateral wall of bladder: Secondary | ICD-10-CM

## 2019-10-23 DIAGNOSIS — I10 Essential (primary) hypertension: Secondary | ICD-10-CM

## 2019-10-23 DIAGNOSIS — C670 Malignant neoplasm of trigone of bladder: Secondary | ICD-10-CM

## 2019-10-23 MED ORDER — OXYCODONE HCL 5 MG PO TABS: 5.0000 mg | ORAL_TABLET | Freq: Four times a day (QID) | ORAL | 0 refills | Status: DC | PRN

## 2019-10-23 MED ORDER — AMLODIPINE BESYLATE 10 MG PO TABS: 10.0000 mg | ORAL_TABLET | Freq: Every day | ORAL | 0 refills | Status: DC

## 2019-10-29 ENCOUNTER — Inpatient Hospital Stay: Payer: Medicare Other | Admitting: Hematology & Oncology

## 2019-10-29 ENCOUNTER — Inpatient Hospital Stay: Payer: Medicare Other

## 2019-10-29 NOTE — Progress Notes (Signed)
Pharmacist Chemotherapy Monitoring - Follow Up Assessment    I verify that I have reviewed each item in the below checklist:  . Regimen for the patient is scheduled for the appropriate day and plan matches scheduled date. Marland Kitchen Appropriate non-routine labs are ordered dependent on drug ordered. . If applicable, additional medications reviewed and ordered per protocol based on lifetime cumulative doses and/or treatment regimen.   Plan for follow-up and/or issues identified: No . I-vent associated with next due treatment: No . MD and/or nursing notified: No  Sharica Roedel, Jacqlyn Larsen 10/29/2019 8:52 AM

## 2019-11-05 ENCOUNTER — Other Ambulatory Visit: Payer: Self-pay

## 2019-11-05 ENCOUNTER — Encounter: Payer: Self-pay | Admitting: Family

## 2019-11-05 ENCOUNTER — Telehealth: Payer: Self-pay | Admitting: Family

## 2019-11-05 ENCOUNTER — Inpatient Hospital Stay (HOSPITAL_BASED_OUTPATIENT_CLINIC_OR_DEPARTMENT_OTHER): Payer: Medicare Other | Admitting: Family

## 2019-11-05 ENCOUNTER — Inpatient Hospital Stay: Payer: Medicare Other

## 2019-11-05 ENCOUNTER — Inpatient Hospital Stay: Payer: Medicare Other | Attending: Hematology & Oncology

## 2019-11-05 VITALS — BP 151/77 | HR 87 | Temp 97.3°F | Resp 18 | Ht 65.0 in | Wt 75.0 lb

## 2019-11-05 DIAGNOSIS — E039 Hypothyroidism, unspecified: Secondary | ICD-10-CM

## 2019-11-05 DIAGNOSIS — Z79899 Other long term (current) drug therapy: Secondary | ICD-10-CM | POA: Diagnosis not present

## 2019-11-05 DIAGNOSIS — C779 Secondary and unspecified malignant neoplasm of lymph node, unspecified: Secondary | ICD-10-CM | POA: Insufficient documentation

## 2019-11-05 DIAGNOSIS — R911 Solitary pulmonary nodule: Secondary | ICD-10-CM | POA: Diagnosis not present

## 2019-11-05 DIAGNOSIS — F17210 Nicotine dependence, cigarettes, uncomplicated: Secondary | ICD-10-CM | POA: Diagnosis not present

## 2019-11-05 DIAGNOSIS — C673 Malignant neoplasm of anterior wall of bladder: Secondary | ICD-10-CM

## 2019-11-05 DIAGNOSIS — Z5112 Encounter for antineoplastic immunotherapy: Secondary | ICD-10-CM | POA: Diagnosis not present

## 2019-11-05 DIAGNOSIS — C672 Malignant neoplasm of lateral wall of bladder: Secondary | ICD-10-CM

## 2019-11-05 DIAGNOSIS — D500 Iron deficiency anemia secondary to blood loss (chronic): Secondary | ICD-10-CM | POA: Insufficient documentation

## 2019-11-05 DIAGNOSIS — E032 Hypothyroidism due to medicaments and other exogenous substances: Secondary | ICD-10-CM

## 2019-11-05 LAB — CBC WITH DIFFERENTIAL (CANCER CENTER ONLY)
Abs Immature Granulocytes: 0 10*3/uL (ref 0.00–0.07)
Basophils Absolute: 0 10*3/uL (ref 0.0–0.1)
Basophils Relative: 1 %
Eosinophils Absolute: 0.1 10*3/uL (ref 0.0–0.5)
Eosinophils Relative: 2 %
HCT: 40.8 % (ref 36.0–46.0)
Hemoglobin: 13.6 g/dL (ref 12.0–15.0)
Immature Granulocytes: 0 %
Lymphocytes Relative: 26 %
Lymphs Abs: 1.1 10*3/uL (ref 0.7–4.0)
MCH: 31.7 pg (ref 26.0–34.0)
MCHC: 33.3 g/dL (ref 30.0–36.0)
MCV: 95.1 fL (ref 80.0–100.0)
Monocytes Absolute: 0.5 10*3/uL (ref 0.1–1.0)
Monocytes Relative: 13 %
Neutro Abs: 2.5 10*3/uL (ref 1.7–7.7)
Neutrophils Relative %: 58 %
Platelet Count: 191 10*3/uL (ref 150–400)
RBC: 4.29 MIL/uL (ref 3.87–5.11)
RDW: 13.5 % (ref 11.5–15.5)
WBC Count: 4.1 10*3/uL (ref 4.0–10.5)
nRBC: 0 % (ref 0.0–0.2)

## 2019-11-05 LAB — CMP (CANCER CENTER ONLY)
ALT: 10 U/L (ref 0–44)
AST: 14 U/L — ABNORMAL LOW (ref 15–41)
Albumin: 4.3 g/dL (ref 3.5–5.0)
Alkaline Phosphatase: 91 U/L (ref 38–126)
Anion gap: 6 (ref 5–15)
BUN: 12 mg/dL (ref 8–23)
CO2: 30 mmol/L (ref 22–32)
Calcium: 9.9 mg/dL (ref 8.9–10.3)
Chloride: 104 mmol/L (ref 98–111)
Creatinine: 0.88 mg/dL (ref 0.44–1.00)
GFR, Est AFR Am: >60 mL/min (ref >60)
GFR, Estimated: >60 mL/min (ref >60)
Glucose, Bld: 103 mg/dL — ABNORMAL HIGH (ref 70–99)
Potassium: 3.8 mmol/L (ref 3.5–5.1)
Sodium: 140 mmol/L (ref 135–145)
Total Bilirubin: 0.4 mg/dL (ref 0.3–1.2)
Total Protein: 6.8 g/dL (ref 6.5–8.1)

## 2019-11-05 LAB — LACTATE DEHYDROGENASE: LDH: 206 U/L — ABNORMAL HIGH (ref 98–192)

## 2019-11-05 LAB — TSH: TSH: 0.434 u[IU]/mL (ref 0.308–3.960)

## 2019-11-05 MED ORDER — SODIUM CHLORIDE 0.9 % IV SOLN
Freq: Once | INTRAVENOUS | Status: AC
  Filled 2019-11-05: qty 250

## 2019-11-05 MED ORDER — SODIUM CHLORIDE 0.9 % IV SOLN
400.0000 mg | Freq: Once | INTRAVENOUS | Status: AC
  Administered 2019-11-05: 400 mg via INTRAVENOUS
  Filled 2019-11-05: qty 16

## 2019-11-05 MED ORDER — SODIUM CHLORIDE 0.9% FLUSH
10.0000 mL | INTRAVENOUS | Status: DC | PRN
  Administered 2019-11-05: 13:00:00 10 mL
  Filled 2019-11-05: qty 10

## 2019-11-05 MED ORDER — HEPARIN SOD (PORK) LOCK FLUSH 100 UNIT/ML IV SOLN
500.0000 [IU] | Freq: Once | INTRAVENOUS | Status: AC | PRN
  Administered 2019-11-05: 13:00:00 500 [IU]
  Filled 2019-11-05: qty 5

## 2019-11-05 NOTE — Patient Instructions (Signed)
[facility] Discharge Instructions for Patients Receiving Chemotherapy  Today you received the following chemotherapy agents Keytruda  To help prevent nausea and vomiting after your treatment, we encourage you to take your nausea medication as prescribed by MD.   If you develop nausea and vomiting that is not controlled by your nausea medication, call the clinic.   BELOW ARE SYMPTOMS THAT SHOULD BE REPORTED IMMEDIATELY:  *FEVER GREATER THAN 100.5 F  *CHILLS WITH OR WITHOUT FEVER  NAUSEA AND VOMITING THAT IS NOT CONTROLLED WITH YOUR NAUSEA MEDICATION  *UNUSUAL SHORTNESS OF BREATH  *UNUSUAL BRUISING OR BLEEDING  TENDERNESS IN MOUTH AND THROAT WITH OR WITHOUT PRESENCE OF ULCERS  *URINARY PROBLEMS  *BOWEL PROBLEMS  UNUSUAL RASH Items with * indicate a potential emergency and should be followed up as soon as possible.  Feel free to call the clinic should you have any questions or concerns. The clinic phone number is [phone].  Please show the [facility] at check-in to the Emergency Department and triage nurse.

## 2019-11-05 NOTE — Patient Instructions (Signed)
Implanted Port Insertion, Care After °This sheet gives you information about how to care for yourself after your procedure. Your health care provider may also give you more specific instructions. If you have problems or questions, contact your health care provider. °What can I expect after the procedure? °After the procedure, it is common to have: °· Discomfort at the port insertion site. °· Bruising on the skin over the port. This should improve over 3-4 days. °Follow these instructions at home: °Port care °· After your port is placed, you will get a manufacturer's information card. The card has information about your port. Keep this card with you at all times. °· Take care of the port as told by your health care provider. Ask your health care provider if you or a family member can get training for taking care of the port at home. A home health care nurse may also take care of the port. °· Make sure to remember what type of port you have. °Incision care ° °  ° °· Follow instructions from your health care provider about how to take care of your port insertion site. Make sure you: °? Wash your hands with soap and water before and after you change your bandage (dressing). If soap and water are not available, use hand sanitizer. °? Change your dressing as told by your health care provider. °? Leave stitches (sutures), skin glue, or adhesive strips in place. These skin closures may need to stay in place for 2 weeks or longer. If adhesive strip edges start to loosen and curl up, you may trim the loose edges. Do not remove adhesive strips completely unless your health care provider tells you to do that. °· Check your port insertion site every day for signs of infection. Check for: °? Redness, swelling, or pain. °? Fluid or blood. °? Warmth. °? Pus or a bad smell. °Activity °· Return to your normal activities as told by your health care provider. Ask your health care provider what activities are safe for you. °· Do not  lift anything that is heavier than 10 lb (4.5 kg), or the limit that you are told, until your health care provider says that it is safe. °General instructions °· Take over-the-counter and prescription medicines only as told by your health care provider. °· Do not take baths, swim, or use a hot tub until your health care provider approves. Ask your health care provider if you may take showers. You may only be allowed to take sponge baths. °· Do not drive for 24 hours if you were given a sedative during your procedure. °· Wear a medical alert bracelet in case of an emergency. This will tell any health care providers that you have a port. °· Keep all follow-up visits as told by your health care provider. This is important. °Contact a health care provider if: °· You cannot flush your port with saline as directed, or you cannot draw blood from the port. °· You have a fever or chills. °· You have redness, swelling, or pain around your port insertion site. °· You have fluid or blood coming from your port insertion site. °· Your port insertion site feels warm to the touch. °· You have pus or a bad smell coming from the port insertion site. °Get help right away if: °· You have chest pain or shortness of breath. °· You have bleeding from your port that you cannot control. °Summary °· Take care of the port as told by your health   care provider. Keep the manufacturer's information card with you at all times. °· Change your dressing as told by your health care provider. °· Contact a health care provider if you have a fever or chills or if you have redness, swelling, or pain around your port insertion site. °· Keep all follow-up visits as told by your health care provider. °This information is not intended to replace advice given to you by your health care provider. Make sure you discuss any questions you have with your health care provider. °Document Revised: 02/06/2018 Document Reviewed: 02/06/2018 °Elsevier Patient Education ©  2020 [employer] ° °

## 2019-11-05 NOTE — Progress Notes (Signed)
Hematology and Oncology Follow Up Visit  CHANNELL QUATTRONE [id number] [birth year] 64 y.o. 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 6 week - startedon 11/14/2018 -- changed on 03/06/2019, s/p cycle 9   Interim History:  Ms. Kinch is here today for follow-up and treatment. She is doing well and has no complaints at this time.  She states that she rarely has SOB and no fatigue. She is smoking 2 cigarettes a day.  No fever, chills, n/v, cough, rash, dizziness, chest pain, palpitations, abdominal pain or changes in bowel or bladder habits.  No episodes of bleeding. No bruising or petechiae.  No swelling, tenderness, numbness or tingling in her extremities.  No falls or syncope to report.  She has a good appetite and states that she also drinks 3 Ensure a day. She is hydrating well with water. Her weight is stable.   ECOG Performance Status: 1 - Symptomatic but completely ambulatory  Medications:  Allergies as of 11/05/2019   No Known Allergies     Medication List       Accurate as of November 05, 2019 10:42 AM. If you have any questions, ask your nurse or doctor.        acetaminophen 500 MG tablet Commonly known as: TYLENOL Take 1,000 mg by mouth every 8 (eight) hours as needed for moderate pain.   albuterol 108 (90 Base) MCG/ACT inhaler Commonly known as: VENTOLIN HFA Inhale 2 puffs into the lungs every 6 (six) hours as needed for wheezing or shortness of breath.   albuterol (2.5 MG/3ML) 0.083% nebulizer solution Commonly known as: PROVENTIL Take 3 mLs (2.5 mg total) by nebulization every 6 (six) hours as needed for wheezing or shortness of breath.   amitriptyline 10 MG tablet Commonly known as: ELAVIL Take 1  tablet (10 mg total) by mouth at bedtime as needed for sleep.   amLODipine 10 MG tablet Commonly known as: NORVASC Take 1 tablet (10 mg total) by mouth daily.   aspirin EC 81 MG tablet Take 81 mg by mouth daily.   citalopram 10 MG tablet Commonly known as: CeleXA Take 1 tablet (10 mg total) by mouth daily.   dronabinol 5 MG capsule Commonly known as: MARINOL Take 1 capsule (5 mg total) by mouth 2 (two) times daily before lunch and supper.   lidocaine-prilocaine cream Commonly known as: EMLA Apply to affected area once   megestrol 20 MG tablet Commonly known as: MEGACE Take 1 tablet (20 mg total) by mouth daily.   ondansetron 4 MG disintegrating tablet Commonly known as: Zofran ODT Take 1 tablet (4 mg total) by mouth every 8 (eight) hours as needed for nausea or vomiting.   ondansetron 8 MG tablet Commonly known as: ZOFRAN Take 1 tablet (8 mg total) by mouth every 8 (eight) hours as needed for nausea or vomiting.   oxyCODONE 5 MG immediate release tablet Commonly known as: Oxy IR/ROXICODONE Take 1 tablet (5 mg total) by mouth every 6 (six) hours as needed for severe pain.   prochlorperazine 10 MG tablet Commonly known as: COMPAZINE Take 1 tablet (10 mg total) by mouth every 6 (six) hours as needed for nausea or vomiting.   Trelegy Ellipta 100-62.5-25 MCG/INH Aepb Generic drug: Fluticasone-Umeclidin-Vilant INHALE  1 PUFF ONCE DAILY   Vitamin D (Ergocalciferol) 1.25 MG (50000 UNIT) Caps capsule Commonly known as: DRISDOL Take 1 capsule by mouth once a week       Allergies: No Known Allergies  Past Medical History, Surgical history, Social history, and Family History were reviewed and updated.  Review of Systems: All other 10 point review of systems is negative.   Physical Exam:  vitals were not taken for this visit.   Wt Readings from Last 3 Encounters:  09/17/19 74 lb (33.6 kg)  08/06/19 76 lb (34.5 kg)  06/13/19 74 lb (33.6 kg)    Ocular: Sclerae  unicteric, pupils equal, round and reactive to light Ear-nose-throat: Oropharynx clear, dentition fair Lymphatic: No cervical or supraclavicular adenopathy Lungs no rales or rhonchi, good excursion bilaterally Heart regular rate and rhythm, no murmur appreciated Abd soft, nontender, positive bowel sounds, no liver or spleen tip palpated on exam, no fluid wave  MSK no focal spinal tenderness, no joint edema Neuro: non-focal, well-oriented, appropriate affect Breasts: Deferred   Lab Results  Component Value Date   WBC 4.0 09/17/2019   HGB 13.6 09/17/2019   HCT 40.4 09/17/2019   MCV 95.5 09/17/2019   PLT 188 09/17/2019   Lab Results  Component Value Date   FERRITIN 10 (L) 12/26/2018   IRON 34 (L) 12/26/2018   TIBC 353 12/26/2018   UIBC 319 12/26/2018   IRONPCTSAT 10 (L) 12/26/2018   Lab Results  Component Value Date   RBC 4.23 09/17/2019   No results found for: KPAFRELGTCHN, LAMBDASER, KAPLAMBRATIO No results found for: IGGSERUM, IGA, IGMSERUM No results found for: [name], SPEI   Chemistry      Component Value Date/Time   NA 141 09/17/2019 0912   K 3.8 09/17/2019 0912   CL 105 09/17/2019 0912   CO2 29 09/17/2019 0912   BUN 22 09/17/2019 0912   CREATININE 1.01 (H) 09/17/2019 0912   CREATININE 1.10 08/20/2013 1027      Component Value Date/Time   CALCIUM 10.1 09/17/2019 0912   ALKPHOS 87 09/17/2019 0912   AST 14 (L) 09/17/2019 0912   ALT 12 09/17/2019 0912   BILITOT 0.3 09/17/2019 0912       Impression and Plan: Ms. Spanier is a very pleasant 64 yo African American female with 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 We will proceed with treatment today as planned.  We will repeat a PET scan to reevaluate her right upper lobe lung nodule a few days before we see her back in 6 weeks.  She will contact our office with any questions or concerns. We can certainly see her  sooner if needed.   Laverna Peace, NP 4/13/202110:42 AM

## 2019-11-05 NOTE — Telephone Encounter (Signed)
Appointments scheduled calendar printed per 4/13 los 

## 2019-11-25 ENCOUNTER — Other Ambulatory Visit: Payer: Self-pay | Admitting: *Deleted

## 2019-11-25 DIAGNOSIS — C670 Malignant neoplasm of trigone of bladder: Secondary | ICD-10-CM

## 2019-11-25 MED ORDER — OXYCODONE HCL 5 MG PO TABS: 5.0000 mg | ORAL_TABLET | Freq: Four times a day (QID) | ORAL | 0 refills | Status: DC | PRN

## 2019-12-10 NOTE — Progress Notes (Signed)
Pharmacist Chemotherapy Monitoring - Follow Up Assessment    I verify that I have reviewed each item in the below checklist:  . Regimen for the patient is scheduled for the appropriate day and plan matches scheduled date. Marland Kitchen Appropriate non-routine labs are ordered dependent on drug ordered. . If applicable, additional medications reviewed and ordered per protocol based on lifetime cumulative doses and/or treatment regimen.   Plan for follow-up and/or issues identified: No . I-vent associated with next due treatment: No . MD and/or nursing notified: No  Acquanetta Belling 12/10/2019 8:31 AM

## 2019-12-13 ENCOUNTER — Other Ambulatory Visit: Payer: Self-pay

## 2019-12-13 ENCOUNTER — Ambulatory Visit (HOSPITAL_COMMUNITY)
Admission: RE | Admit: 2019-12-13 | Discharge: 2019-12-13 | Disposition: A | Discharge status: Home / Self Care | Payer: Medicare Other | Source: Ambulatory Visit | Attending: Family | Admitting: Family

## 2019-12-13 DIAGNOSIS — C3490 Malignant neoplasm of unspecified part of unspecified bronchus or lung: Secondary | ICD-10-CM | POA: Diagnosis not present

## 2019-12-13 DIAGNOSIS — C672 Malignant neoplasm of lateral wall of bladder: Secondary | ICD-10-CM | POA: Diagnosis not present

## 2019-12-13 DIAGNOSIS — R911 Solitary pulmonary nodule: Secondary | ICD-10-CM | POA: Diagnosis present

## 2019-12-13 DIAGNOSIS — I2510 Atherosclerotic heart disease of native coronary artery without angina pectoris: Secondary | ICD-10-CM | POA: Diagnosis not present

## 2019-12-13 DIAGNOSIS — I700 Atherosclerosis of aorta: Secondary | ICD-10-CM | POA: Insufficient documentation

## 2019-12-13 DIAGNOSIS — R918 Other nonspecific abnormal finding of lung field: Secondary | ICD-10-CM | POA: Diagnosis not present

## 2019-12-13 DIAGNOSIS — J439 Emphysema, unspecified: Secondary | ICD-10-CM | POA: Insufficient documentation

## 2019-12-13 LAB — GLUCOSE, CAPILLARY: Glucose-Capillary: 93 mg/dL (ref 70–99)

## 2019-12-13 MED ORDER — FLUDEOXYGLUCOSE F - 18 (FDG) INJECTION
4.9900 | Freq: Once | INTRAVENOUS | Status: AC
  Administered 2019-12-13: 4.99 via INTRAVENOUS

## 2019-12-16 ENCOUNTER — Other Ambulatory Visit: Payer: Self-pay | Admitting: *Deleted

## 2019-12-16 DIAGNOSIS — C670 Malignant neoplasm of trigone of bladder: Secondary | ICD-10-CM

## 2019-12-16 DIAGNOSIS — C672 Malignant neoplasm of lateral wall of bladder: Secondary | ICD-10-CM

## 2019-12-16 DIAGNOSIS — I10 Essential (primary) hypertension: Secondary | ICD-10-CM

## 2019-12-16 MED ORDER — OXYCODONE HCL 5 MG PO TABS: 5.0000 mg | ORAL_TABLET | Freq: Four times a day (QID) | ORAL | 0 refills | Status: DC | PRN

## 2019-12-16 MED ORDER — AMLODIPINE BESYLATE 10 MG PO TABS: 10.0000 mg | ORAL_TABLET | Freq: Every day | ORAL | 0 refills | Status: DC

## 2019-12-17 ENCOUNTER — Inpatient Hospital Stay: Payer: Medicare Other

## 2019-12-17 ENCOUNTER — Inpatient Hospital Stay: Payer: Medicare Other | Admitting: Hematology & Oncology

## 2019-12-17 ENCOUNTER — Telehealth: Payer: Self-pay | Admitting: Hematology & Oncology

## 2019-12-17 ENCOUNTER — Other Ambulatory Visit: Payer: Medicare Other

## 2019-12-17 ENCOUNTER — Ambulatory Visit: Payer: Medicare Other

## 2019-12-17 NOTE — Telephone Encounter (Signed)
I called and LMVM for patient regarding her appointments for 5/25 due to being a NO SHOW were moved out until 6/1 per Dr Marin Olp.  I asked that she call back to confirm these appts

## 2019-12-24 ENCOUNTER — Inpatient Hospital Stay: Payer: Medicare Other | Attending: Hematology & Oncology

## 2019-12-24 ENCOUNTER — Other Ambulatory Visit: Payer: Self-pay

## 2019-12-24 ENCOUNTER — Inpatient Hospital Stay (HOSPITAL_BASED_OUTPATIENT_CLINIC_OR_DEPARTMENT_OTHER): Payer: Medicare Other | Admitting: Hematology & Oncology

## 2019-12-24 ENCOUNTER — Inpatient Hospital Stay: Payer: Medicare Other

## 2019-12-24 VITALS — BP 135/87 | HR 83

## 2019-12-24 DIAGNOSIS — Z79899 Other long term (current) drug therapy: Secondary | ICD-10-CM | POA: Diagnosis not present

## 2019-12-24 DIAGNOSIS — C672 Malignant neoplasm of lateral wall of bladder: Secondary | ICD-10-CM

## 2019-12-24 DIAGNOSIS — Z5112 Encounter for antineoplastic immunotherapy: Secondary | ICD-10-CM | POA: Diagnosis not present

## 2019-12-24 DIAGNOSIS — R918 Other nonspecific abnormal finding of lung field: Secondary | ICD-10-CM | POA: Insufficient documentation

## 2019-12-24 DIAGNOSIS — R911 Solitary pulmonary nodule: Secondary | ICD-10-CM

## 2019-12-24 DIAGNOSIS — C779 Secondary and unspecified malignant neoplasm of lymph node, unspecified: Secondary | ICD-10-CM | POA: Insufficient documentation

## 2019-12-24 DIAGNOSIS — E039 Hypothyroidism, unspecified: Secondary | ICD-10-CM

## 2019-12-24 LAB — CMP (CANCER CENTER ONLY)
ALT: 9 U/L (ref 0–44)
AST: 12 U/L — ABNORMAL LOW (ref 15–41)
Albumin: 4.1 g/dL (ref 3.5–5.0)
Alkaline Phosphatase: 97 U/L (ref 38–126)
Anion gap: 6 (ref 5–15)
BUN: 12 mg/dL (ref 8–23)
CO2: 31 mmol/L (ref 22–32)
Calcium: 9.7 mg/dL (ref 8.9–10.3)
Chloride: 104 mmol/L (ref 98–111)
Creatinine: 0.89 mg/dL (ref 0.44–1.00)
GFR, Est AFR Am: >60 mL/min (ref >60)
GFR, Estimated: >60 mL/min (ref >60)
Glucose, Bld: 95 mg/dL (ref 70–99)
Potassium: 4 mmol/L (ref 3.5–5.1)
Sodium: 141 mmol/L (ref 135–145)
Total Bilirubin: 0.3 mg/dL (ref 0.3–1.2)
Total Protein: 7.1 g/dL (ref 6.5–8.1)

## 2019-12-24 LAB — CBC WITH DIFFERENTIAL (CANCER CENTER ONLY)
Abs Immature Granulocytes: 0.01 10*3/uL (ref 0.00–0.07)
Basophils Absolute: 0 10*3/uL (ref 0.0–0.1)
Basophils Relative: 1 %
Eosinophils Absolute: 0.1 10*3/uL (ref 0.0–0.5)
Eosinophils Relative: 2 %
HCT: 38.3 % (ref 36.0–46.0)
Hemoglobin: 12.6 g/dL (ref 12.0–15.0)
Immature Granulocytes: 0 %
Lymphocytes Relative: 23 %
Lymphs Abs: 0.9 10*3/uL (ref 0.7–4.0)
MCH: 31.7 pg (ref 26.0–34.0)
MCHC: 32.9 g/dL (ref 30.0–36.0)
MCV: 96.2 fL (ref 80.0–100.0)
Monocytes Absolute: 0.4 10*3/uL (ref 0.1–1.0)
Monocytes Relative: 10 %
Neutro Abs: 2.7 10*3/uL (ref 1.7–7.7)
Neutrophils Relative %: 64 %
Platelet Count: 182 10*3/uL (ref 150–400)
RBC: 3.98 MIL/uL (ref 3.87–5.11)
RDW: 14.1 % (ref 11.5–15.5)
WBC Count: 4.1 10*3/uL (ref 4.0–10.5)
nRBC: 0 % (ref 0.0–0.2)

## 2019-12-24 LAB — TSH: TSH: 0.974 u[IU]/mL (ref 0.350–4.500)

## 2019-12-24 LAB — LACTATE DEHYDROGENASE: LDH: 200 U/L — ABNORMAL HIGH (ref 98–192)

## 2019-12-24 MED ORDER — SODIUM CHLORIDE 0.9 % IV SOLN
400.0000 mg | Freq: Once | INTRAVENOUS | Status: AC
  Administered 2019-12-24: 400 mg via INTRAVENOUS
  Filled 2019-12-24: qty 16

## 2019-12-24 MED ORDER — SODIUM CHLORIDE 0.9 % IV SOLN
Freq: Once | INTRAVENOUS | Status: AC
  Filled 2019-12-24: qty 250

## 2019-12-24 MED ORDER — HEPARIN SOD (PORK) LOCK FLUSH 100 UNIT/ML IV SOLN
500.0000 [IU] | Freq: Once | INTRAVENOUS | Status: AC | PRN
  Administered 2019-12-24: 500 [IU]
  Filled 2019-12-24: qty 5

## 2019-12-24 MED ORDER — SODIUM CHLORIDE 0.9% FLUSH
10.0000 mL | INTRAVENOUS | Status: DC | PRN
  Administered 2019-12-24: 10 mL
  Filled 2019-12-24: qty 10

## 2019-12-24 NOTE — Patient Instructions (Signed)
Implanted Port Insertion, Care After °This sheet gives you information about how to care for yourself after your procedure. Your health care provider may also give you more specific instructions. If you have problems or questions, contact your health care provider. °What can I expect after the procedure? °After the procedure, it is common to have: °· Discomfort at the port insertion site. °· Bruising on the skin over the port. This should improve over 3-4 days. °Follow these instructions at home: °Port care °· After your port is placed, you will get a manufacturer's information card. The card has information about your port. Keep this card with you at all times. °· Take care of the port as told by your health care provider. Ask your health care provider if you or a family member can get training for taking care of the port at home. A home health care nurse may also take care of the port. °· Make sure to remember what type of port you have. °Incision care ° °  ° °· Follow instructions from your health care provider about how to take care of your port insertion site. Make sure you: °? Wash your hands with soap and water before and after you change your bandage (dressing). If soap and water are not available, use hand sanitizer. °? Change your dressing as told by your health care provider. °? Leave stitches (sutures), skin glue, or adhesive strips in place. These skin closures may need to stay in place for 2 weeks or longer. If adhesive strip edges start to loosen and curl up, you may trim the loose edges. Do not remove adhesive strips completely unless your health care provider tells you to do that. °· Check your port insertion site every day for signs of infection. Check for: °? Redness, swelling, or pain. °? Fluid or blood. °? Warmth. °? Pus or a bad smell. °Activity °· Return to your normal activities as told by your health care provider. Ask your health care provider what activities are safe for you. °· Do not  lift anything that is heavier than 10 lb (4.5 kg), or the limit that you are told, until your health care provider says that it is safe. °General instructions °· Take over-the-counter and prescription medicines only as told by your health care provider. °· Do not take baths, swim, or use a hot tub until your health care provider approves. Ask your health care provider if you may take showers. You may only be allowed to take sponge baths. °· Do not drive for 24 hours if you were given a sedative during your procedure. °· Wear a medical alert bracelet in case of an emergency. This will tell any health care providers that you have a port. °· Keep all follow-up visits as told by your health care provider. This is important. °Contact a health care provider if: °· You cannot flush your port with saline as directed, or you cannot draw blood from the port. °· You have a fever or chills. °· You have redness, swelling, or pain around your port insertion site. °· You have fluid or blood coming from your port insertion site. °· Your port insertion site feels warm to the touch. °· You have pus or a bad smell coming from the port insertion site. °Get help right away if: °· You have chest pain or shortness of breath. °· You have bleeding from your port that you cannot control. °Summary °· Take care of the port as told by your health   care provider. Keep the manufacturer's information card with you at all times. °· Change your dressing as told by your health care provider. °· Contact a health care provider if you have a fever or chills or if you have redness, swelling, or pain around your port insertion site. °· Keep all follow-up visits as told by your health care provider. °This information is not intended to replace advice given to you by your health care provider. Make sure you discuss any questions you have with your health care provider. °Document Revised: 02/06/2018 Document Reviewed: 02/06/2018 °Elsevier Patient Education ©  2020 [employer] ° °

## 2019-12-24 NOTE — Patient Instructions (Signed)
Pembrolizumab injection What is this medicine? PEMBROLIZUMAB (pem broe liz ue mab) is a monoclonal antibody. It is used to treat certain types of cancer. This medicine may be used for other purposes; ask your health care provider or pharmacist if you have questions. COMMON BRAND NAME(S): Keytruda What should I tell my health care provider before I take this medicine? They need to know if you have any of these conditions:  diabetes  immune system problems  inflammatory bowel disease  liver disease  lung or breathing disease  lupus  received or scheduled to receive an organ transplant or a stem-cell transplant that uses donor stem cells  an unusual or allergic reaction to pembrolizumab, other medicines, foods, dyes, or preservatives  pregnant or trying to get pregnant  breast-feeding How should I use this medicine? This medicine is for infusion into a vein. It is given by a health care professional in a hospital or clinic setting. A special MedGuide will be given to you before each treatment. Be sure to read this information carefully each time. Talk to your pediatrician regarding the use of this medicine in children. While this drug may be prescribed for children as young as 6 months for selected conditions, precautions do apply. Overdosage: If you think you have taken too much of this medicine contact a poison control center or emergency room at once. NOTE: This medicine is only for you. Do not share this medicine with others. What if I miss a dose? It is important not to miss your dose. Call your doctor or health care professional if you are unable to keep an appointment. What may interact with this medicine? Interactions have not been studied. Give your health care provider a list of all the medicines, herbs, non-prescription drugs, or dietary supplements you use. Also tell them if you smoke, drink alcohol, or use illegal drugs. Some items may interact with your medicine. This  list may not describe all possible interactions. Give your health care provider a list of all the medicines, herbs, non-prescription drugs, or dietary supplements you use. Also tell them if you smoke, drink alcohol, or use illegal drugs. Some items may interact with your medicine. What should I watch for while using this medicine? Your condition will be monitored carefully while you are receiving this medicine. You may need blood work done while you are taking this medicine. Do not become pregnant while taking this medicine or for 4 months after stopping it. Women should inform their doctor if they wish to become pregnant or think they might be pregnant. There is a potential for serious side effects to an unborn child. Talk to your health care professional or pharmacist for more information. Do not breast-feed an infant while taking this medicine or for 4 months after the last dose. What side effects may I notice from receiving this medicine? Side effects that you should report to your doctor or health care professional as soon as possible:  allergic reactions like skin rash, itching or hives, swelling of the face, lips, or tongue  bloody or black, tarry  breathing problems  changes in vision  chest pain  chills  confusion  constipation  cough  diarrhea  dizziness or feeling faint or lightheaded  fast or irregular heartbeat  fever  flushing  joint pain  low blood counts - this medicine may decrease the number of white blood cells, red blood cells and platelets. You may be at increased risk for infections and bleeding.  muscle pain  muscle   weakness  pain, tingling, numbness in the hands or feet  persistent headache  redness, blistering, peeling or loosening of the skin, including inside the mouth  signs and symptoms of high blood sugar such as dizziness; dry mouth; dry skin; fruity breath; nausea; stomach pain; increased hunger or thirst; increased urination  signs  and symptoms of kidney injury like trouble passing urine or change in the amount of urine  signs and symptoms of liver injury like dark urine, light-colored stools, loss of appetite, nausea, right upper belly pain, yellowing of the eyes or skin  sweating  swollen lymph nodes  weight loss Side effects that usually do not require medical attention (report to your doctor or health care professional if they continue or are bothersome):  decreased appetite  hair loss  muscle pain  tiredness This list may not describe all possible side effects. Call your doctor for medical advice about side effects. You may report side effects to FDA at 1-800-FDA-1088. Where should I keep my medicine? This drug is given in a hospital or clinic and will not be stored at home. NOTE: This sheet is a summary. It may not cover all possible information. If you have questions about this medicine, talk to your doctor, pharmacist, or health care provider.  2020 Elsevier/Gold Standard (2019-05-17 18:07:58)  

## 2019-12-24 NOTE — Progress Notes (Signed)
Hematology and Oncology Follow Up Visit  Donna Curry [id number] 1955-08-17 64 y.o. 12/24/2019   Principle Diagnosis:  Muscle invasive urothelial carcinoma of the bladder-lymph node positive Iron deficiency anemia secondary to blood loss  Past Therapy: Radiation/low-dose weekly cis-platinum -- s/p cycle 6 -- completed on 04/05/2018  Current Therapy:   Pembrolizumab 400 mg IV q. 6 week --s/pcycle #10-- starton 11/14/2018 -- changed on 03/06/2019   Interim History:  Donna Curry is here today for follow-up and treatment.  Unfortunately, seems like we may have a problem.  On her PET scan, I think everything with the bladder cancer looks great.  However, in the left lung, there is a new lesion in the left upper lung.  This measures 1.6 x 1.2 cm.  As an SUV of 6.4.  The past nodule on the right upper lung is smaller.  There is little bit of increased metabolism in the upper left hilum measuring  an SUV of 4.8.  I have to believe that this is a primary lung cancer.  She is smoking.  She is trying to cut back.  The real problem is trying to get a diagnosis of lung cancer.  This is certainly not a large to her.  It is near the periphery however.  As such, we might be able to get a biopsy to prove malignancy.  The radiologist is not positive that this is a malignancy but could be inflammation/infection.  Otherwise she seems to be doing okay.  She has her chronic pain issues.  She is eating okay.  Her weight is 74 pounds which is the norm for her.  She has had no issues with bleeding.  There is been no change in bowel or bladder habits.  She has had no leg swelling.  Overall, I would say her performance status is ECOG 1.      Medications:  Allergies as of 12/24/2019   No Known Allergies     Medication List       Accurate as of December 24, 2019 12:55 PM. If you have any questions, ask your nurse or doctor.        acetaminophen 500 MG tablet Commonly known as: TYLENOL Take 1,000 mg by  mouth every 8 (eight) hours as needed for moderate pain.   albuterol 108 (90 Base) MCG/ACT inhaler Commonly known as: VENTOLIN HFA Inhale 2 puffs into the lungs every 6 (six) hours as needed for wheezing or shortness of breath.   albuterol (2.5 MG/3ML) 0.083% nebulizer solution Commonly known as: PROVENTIL Take 3 mLs (2.5 mg total) by nebulization every 6 (six) hours as needed for wheezing or shortness of breath.   amitriptyline 10 MG tablet Commonly known as: ELAVIL Take 1 tablet (10 mg total) by mouth at bedtime as needed for sleep.   amLODipine 10 MG tablet Commonly known as: NORVASC Take 1 tablet (10 mg total) by mouth daily.   aspirin EC 81 MG tablet Take 81 mg by mouth daily.   citalopram 10 MG tablet Commonly known as: CeleXA Take 1 tablet (10 mg total) by mouth daily.   dronabinol 5 MG capsule Commonly known as: MARINOL Take 1 capsule (5 mg total) by mouth 2 (two) times daily before lunch and supper.   lidocaine-prilocaine cream Commonly known as: EMLA Apply to affected area once   megestrol 20 MG tablet Commonly known as: MEGACE Take 1 tablet (20 mg total) by mouth daily.   ondansetron 4 MG disintegrating tablet Commonly known as: Zofran ODT  Take 1 tablet (4 mg total) by mouth every 8 (eight) hours as needed for nausea or vomiting.   ondansetron 8 MG tablet Commonly known as: ZOFRAN Take 1 tablet (8 mg total) by mouth every 8 (eight) hours as needed for nausea or vomiting.   oxyCODONE 5 MG immediate release tablet Commonly known as: Oxy IR/ROXICODONE Take 1 tablet (5 mg total) by mouth every 6 (six) hours as needed for severe pain.   prochlorperazine 10 MG tablet Commonly known as: COMPAZINE Take 1 tablet (10 mg total) by mouth every 6 (six) hours as needed for nausea or vomiting.   Trelegy Ellipta 100-62.5-25 MCG/INH Aepb Generic drug: Fluticasone-Umeclidin-Vilant INHALE 1 PUFF ONCE DAILY   Vitamin D (Ergocalciferol) 1.25 MG (50000 UNIT) Caps  capsule Commonly known as: DRISDOL Take 1 capsule by mouth once a week       Allergies: No Known Allergies  Past Medical History, Surgical history, Social history, and Family History were reviewed and updated.  Review of Systems: Review of Systems  Constitutional: Negative.   HENT: Negative.   Eyes: Negative.   Respiratory: Negative.   Cardiovascular: Negative.   Gastrointestinal: Negative.   Genitourinary: Negative.   Musculoskeletal: Positive for joint pain.  Skin: Negative.   Neurological: Negative.   Endo/Heme/Allergies: Negative.   Psychiatric/Behavioral: Negative.       Physical Exam:  vitals were not taken for this visit.   Wt Readings from Last 3 Encounters:  12/24/19 74 lb 0.4 oz (33.6 kg)  11/05/19 75 lb (34 kg)  09/17/19 74 lb (33.6 kg)      Physical Activity:   . Days of Exercise per Week:   . Minutes of Exercise per Session:     Physical Exam Vitals reviewed.  HENT:     Head: Normocephalic and atraumatic.  Eyes:     Pupils: Pupils are equal, round, and reactive to light.  Cardiovascular:     Rate and Rhythm: Normal rate and regular rhythm.     Heart sounds: Normal heart sounds.  Pulmonary:     Effort: Pulmonary effort is normal.     Breath sounds: Normal breath sounds.  Abdominal:     General: Bowel sounds are normal.     Palpations: Abdomen is soft.  Musculoskeletal:        General: No tenderness or deformity. Normal range of motion.     Cervical back: Normal range of motion.  Lymphadenopathy:     Cervical: No cervical adenopathy.  Skin:    General: Skin is warm and dry.     Findings: No erythema or rash.  Neurological:     Mental Status: She is alert and oriented to person, place, and time.  Psychiatric:        Behavior: Behavior normal.        Thought Content: Thought content normal.        Judgment: Judgment normal.      Lab Results  Component Value Date   WBC 4.1 12/24/2019   HGB 12.6 12/24/2019   HCT 38.3 12/24/2019    MCV 96.2 12/24/2019   PLT 182 12/24/2019   Lab Results  Component Value Date   FERRITIN 10 (L) 12/26/2018   IRON 34 (L) 12/26/2018   TIBC 353 12/26/2018   UIBC 319 12/26/2018   IRONPCTSAT 10 (L) 12/26/2018   Lab Results  Component Value Date   RBC 3.98 12/24/2019   No results found for: KPAFRELGTCHN, LAMBDASER, KAPLAMBRATIO No results found for: IGGSERUM, IGA, IGMSERUM No results  found for: [name], SPEI   Chemistry      Component Value Date/Time   NA 141 12/24/2019 1106   K 4.0 12/24/2019 1106   CL 104 12/24/2019 1106   CO2 31 12/24/2019 1106   BUN 12 12/24/2019 1106   CREATININE 0.89 12/24/2019 1106   CREATININE 1.10 08/20/2013 1027      Component Value Date/Time   CALCIUM 9.7 12/24/2019 1106   ALKPHOS 97 12/24/2019 1106   AST 12 (L) 12/24/2019 1106   ALT 9 12/24/2019 1106   BILITOT 0.3 12/24/2019 1106       Impression and Plan: Ms. Holquin is a very pleasant 64 yo African American female with 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  I think that our mission right now is to see what is going on with the left lung.  I will see if interventional radiology can do a biopsy.  I know that this is not a big tumor.  However, I think we really need to prove if this is malignant.  If so, then I would set her up for stereotactic radiosurgery.  I think this would be a very reasonable way to treat this tumor.  This is a little complicated.  I spent a little over 35 minutes talking with Ms. Treaster about this.  She was seems to have a good attitude.  We will try to get her back here in another 6 weeks.  By then, I am sure we will know what is going on with the lung.  [name], MD 6/1/202112:55 PM

## 2019-12-25 ENCOUNTER — Encounter (HOSPITAL_COMMUNITY): Payer: Self-pay

## 2019-12-25 NOTE — Progress Notes (Signed)
F[place] Female, 64 y.o., [birth year] MRN:  [id number] Phone:  [phone] Jerilynn Mages) PCP:  [name], NP Primary Cvg:  [place] Medicare Next Appt With Oncology 02/04/2020 at 11:00 AM  FW: BIOPSY Received: Today Message Contents  Arne Cleveland, MD  Lennox Solders E      Biopsy cancelled, for now   Previous Messages  ----- Message -----  From: [name], MD  Sent: 12/25/2019 11:04 AM EDT  To: Arne Cleveland, MD  Subject: RE: BIOPSY                    I will!! Thanks!! pete  ----- Message -----  From: Arne Cleveland, MD  Sent: 12/25/2019  8:29 AM EDT  To: Murvin Donning, MD  Subject: RE: BIOPSY                    Pete:   Given her COPD is this a high risk biopsy.   PET suggests possible infectious/inflamm etiology for the current LUL lesion  (and I note that the RUL "nodule" seen in Feb has resolved so it was presumably infectious/inflamm)  so would you consider a f/u noncontract CT chest in 3 mo to assess for resolution?   Thanks  Quillian Quince  ----- Message -----  From: Lenore Cordia  Sent: 12/24/2019  3:04 PM EDT  To: Ir Procedure Requests  Subject: BIOPSY                      Procedure Requested: CT BIOPSY    Reason for Procedure: History of smoking. History of localized bladder cancer. Now with left upper lung nodule.    Provider Requesting: [name]  Provider Telephone: [phone]    Other Info: RAD EXAMS IN Epic

## 2020-01-16 ENCOUNTER — Other Ambulatory Visit: Payer: Self-pay | Admitting: *Deleted

## 2020-01-16 DIAGNOSIS — C670 Malignant neoplasm of trigone of bladder: Secondary | ICD-10-CM

## 2020-01-16 MED ORDER — OXYCODONE HCL 5 MG PO TABS: 5.0000 mg | ORAL_TABLET | Freq: Four times a day (QID) | ORAL | 0 refills | Status: DC | PRN

## 2020-02-03 ENCOUNTER — Other Ambulatory Visit: Payer: Self-pay | Admitting: Hematology & Oncology

## 2020-02-04 ENCOUNTER — Inpatient Hospital Stay: Payer: Medicare Other

## 2020-02-04 ENCOUNTER — Inpatient Hospital Stay: Payer: Medicare Other | Attending: Hematology & Oncology

## 2020-02-04 ENCOUNTER — Inpatient Hospital Stay (HOSPITAL_BASED_OUTPATIENT_CLINIC_OR_DEPARTMENT_OTHER): Payer: Medicare Other | Admitting: Family

## 2020-02-04 ENCOUNTER — Other Ambulatory Visit: Payer: Self-pay | Admitting: *Deleted

## 2020-02-04 ENCOUNTER — Other Ambulatory Visit: Payer: Self-pay

## 2020-02-04 VITALS — BP 136/85 | HR 84 | Temp 97.6°F | Resp 17 | Wt 76.4 lb

## 2020-02-04 DIAGNOSIS — R911 Solitary pulmonary nodule: Secondary | ICD-10-CM

## 2020-02-04 DIAGNOSIS — C672 Malignant neoplasm of lateral wall of bladder: Secondary | ICD-10-CM | POA: Diagnosis not present

## 2020-02-04 DIAGNOSIS — Z5112 Encounter for antineoplastic immunotherapy: Secondary | ICD-10-CM | POA: Insufficient documentation

## 2020-02-04 DIAGNOSIS — D500 Iron deficiency anemia secondary to blood loss (chronic): Secondary | ICD-10-CM | POA: Diagnosis not present

## 2020-02-04 DIAGNOSIS — I10 Essential (primary) hypertension: Secondary | ICD-10-CM

## 2020-02-04 DIAGNOSIS — Z79899 Other long term (current) drug therapy: Secondary | ICD-10-CM | POA: Diagnosis not present

## 2020-02-04 DIAGNOSIS — C670 Malignant neoplasm of trigone of bladder: Secondary | ICD-10-CM

## 2020-02-04 DIAGNOSIS — T451X5A Adverse effect of antineoplastic and immunosuppressive drugs, initial encounter: Secondary | ICD-10-CM

## 2020-02-04 DIAGNOSIS — R110 Nausea: Secondary | ICD-10-CM | POA: Diagnosis not present

## 2020-02-04 DIAGNOSIS — R630 Anorexia: Secondary | ICD-10-CM

## 2020-02-04 LAB — CMP (CANCER CENTER ONLY)
ALT: 8 U/L (ref 0–44)
AST: 12 U/L — ABNORMAL LOW (ref 15–41)
Albumin: 4.3 g/dL (ref 3.5–5.0)
Alkaline Phosphatase: 92 U/L (ref 38–126)
Anion gap: 6 (ref 5–15)
BUN: 13 mg/dL (ref 8–23)
CO2: 31 mmol/L (ref 22–32)
Calcium: 9.9 mg/dL (ref 8.9–10.3)
Chloride: 103 mmol/L (ref 98–111)
Creatinine: 0.95 mg/dL (ref 0.44–1.00)
GFR, Est AFR Am: >60 mL/min (ref >60)
GFR, Estimated: >60 mL/min (ref >60)
Glucose, Bld: 96 mg/dL (ref 70–99)
Potassium: 3.6 mmol/L (ref 3.5–5.1)
Sodium: 140 mmol/L (ref 135–145)
Total Bilirubin: 0.4 mg/dL (ref 0.3–1.2)
Total Protein: 6.9 g/dL (ref 6.5–8.1)

## 2020-02-04 LAB — CBC WITH DIFFERENTIAL (CANCER CENTER ONLY)
Abs Immature Granulocytes: 0.01 10*3/uL (ref 0.00–0.07)
Basophils Absolute: 0 10*3/uL (ref 0.0–0.1)
Basophils Relative: 0 %
Eosinophils Absolute: 0.1 10*3/uL (ref 0.0–0.5)
Eosinophils Relative: 1 %
HCT: 39.8 % (ref 36.0–46.0)
Hemoglobin: 13.4 g/dL (ref 12.0–15.0)
Immature Granulocytes: 0 %
Lymphocytes Relative: 25 %
Lymphs Abs: 1 10*3/uL (ref 0.7–4.0)
MCH: 32.2 pg (ref 26.0–34.0)
MCHC: 33.7 g/dL (ref 30.0–36.0)
MCV: 95.7 fL (ref 80.0–100.0)
Monocytes Absolute: 0.4 10*3/uL (ref 0.1–1.0)
Monocytes Relative: 10 %
Neutro Abs: 2.5 10*3/uL (ref 1.7–7.7)
Neutrophils Relative %: 64 %
Platelet Count: 163 10*3/uL (ref 150–400)
RBC: 4.16 MIL/uL (ref 3.87–5.11)
RDW: 14.6 % (ref 11.5–15.5)
WBC Count: 4 10*3/uL (ref 4.0–10.5)
nRBC: 0 % (ref 0.0–0.2)

## 2020-02-04 MED ORDER — SODIUM CHLORIDE 0.9 % IV SOLN
400.0000 mg | Freq: Once | INTRAVENOUS | Status: AC
  Administered 2020-02-04: 400 mg via INTRAVENOUS
  Filled 2020-02-04: qty 16

## 2020-02-04 MED ORDER — SODIUM CHLORIDE 0.9% FLUSH
10.0000 mL | INTRAVENOUS | Status: DC | PRN
  Administered 2020-02-04: 10 mL
  Filled 2020-02-04: qty 10

## 2020-02-04 MED ORDER — SODIUM CHLORIDE 0.9 % IV SOLN
Freq: Once | INTRAVENOUS | Status: AC
  Filled 2020-02-04: qty 250

## 2020-02-04 MED ORDER — ONDANSETRON 4 MG PO TBDP: 4.0000 mg | ORAL_TABLET | Freq: Three times a day (TID) | ORAL | 0 refills | Status: DC | PRN

## 2020-02-04 MED ORDER — DRONABINOL 5 MG PO CAPS: 5.0000 mg | ORAL_CAPSULE | Freq: Two times a day (BID) | ORAL | 0 refills | Status: DC

## 2020-02-04 MED ORDER — OXYCODONE HCL 5 MG PO TABS: 5.0000 mg | ORAL_TABLET | Freq: Four times a day (QID) | ORAL | 0 refills | Status: DC | PRN

## 2020-02-04 MED ORDER — HEPARIN SOD (PORK) LOCK FLUSH 100 UNIT/ML IV SOLN
500.0000 [IU] | Freq: Once | INTRAVENOUS | Status: AC | PRN
  Administered 2020-02-04: 500 [IU]
  Filled 2020-02-04: qty 5

## 2020-02-04 MED ORDER — AMLODIPINE BESYLATE 10 MG PO TABS: 10.0000 mg | ORAL_TABLET | Freq: Every day | ORAL | 0 refills | Status: DC

## 2020-02-04 NOTE — Patient Instructions (Signed)
[facility] Discharge Instructions for Patients Receiving Chemotherapy  Today you received the following chemotherapy agents :  Keytruda.  To help prevent nausea and vomiting after your treatment, we encourage you to take your nausea medication as prescribed.   If you develop nausea and vomiting that is not controlled by your nausea medication, call the clinic.   BELOW ARE SYMPTOMS THAT SHOULD BE REPORTED IMMEDIATELY:  *FEVER GREATER THAN 100.5 F  *CHILLS WITH OR WITHOUT FEVER  NAUSEA AND VOMITING THAT IS NOT CONTROLLED WITH YOUR NAUSEA MEDICATION  *UNUSUAL SHORTNESS OF BREATH  *UNUSUAL BRUISING OR BLEEDING  TENDERNESS IN MOUTH AND THROAT WITH OR WITHOUT PRESENCE OF ULCERS  *URINARY PROBLEMS  *BOWEL PROBLEMS  UNUSUAL RASH Items with * indicate a potential emergency and should be followed up as soon as possible.  Feel free to call the clinic should you have any questions or concerns. The clinic phone number is [phone].  Please show the CHEMO ALERT CARD at check-in to the Emergency Department and triage nurse.  
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
## 2020-02-04 NOTE — Patient Instructions (Signed)
Implanted Port Insertion, Care After °This sheet gives you information about how to care for yourself after your procedure. Your health care provider may also give you more specific instructions. If you have problems or questions, contact your health care provider. °What can I expect after the procedure? °After the procedure, it is common to have: °· Discomfort at the port insertion site. °· Bruising on the skin over the port. This should improve over 3-4 days. °Follow these instructions at home: °Port care °· After your port is placed, you will get a manufacturer's information card. The card has information about your port. Keep this card with you at all times. °· Take care of the port as told by your health care provider. Ask your health care provider if you or a family member can get training for taking care of the port at home. A home health care nurse may also take care of the port. °· Make sure to remember what type of port you have. °Incision care ° °  ° °· Follow instructions from your health care provider about how to take care of your port insertion site. Make sure you: °? Wash your hands with soap and water before and after you change your bandage (dressing). If soap and water are not available, use hand sanitizer. °? Change your dressing as told by your health care provider. °? Leave stitches (sutures), skin glue, or adhesive strips in place. These skin closures may need to stay in place for 2 weeks or longer. If adhesive strip edges start to loosen and curl up, you may trim the loose edges. Do not remove adhesive strips completely unless your health care provider tells you to do that. °· Check your port insertion site every day for signs of infection. Check for: °? Redness, swelling, or pain. °? Fluid or blood. °? Warmth. °? Pus or a bad smell. °Activity °· Return to your normal activities as told by your health care provider. Ask your health care provider what activities are safe for you. °· Do not  lift anything that is heavier than 10 lb (4.5 kg), or the limit that you are told, until your health care provider says that it is safe. °General instructions °· Take over-the-counter and prescription medicines only as told by your health care provider. °· Do not take baths, swim, or use a hot tub until your health care provider approves. Ask your health care provider if you may take showers. You may only be allowed to take sponge baths. °· Do not drive for 24 hours if you were given a sedative during your procedure. °· Wear a medical alert bracelet in case of an emergency. This will tell any health care providers that you have a port. °· Keep all follow-up visits as told by your health care provider. This is important. °Contact a health care provider if: °· You cannot flush your port with saline as directed, or you cannot draw blood from the port. °· You have a fever or chills. °· You have redness, swelling, or pain around your port insertion site. °· You have fluid or blood coming from your port insertion site. °· Your port insertion site feels warm to the touch. °· You have pus or a bad smell coming from the port insertion site. °Get help right away if: °· You have chest pain or shortness of breath. °· You have bleeding from your port that you cannot control. °Summary °· Take care of the port as told by your health   care provider. Keep the manufacturer's information card with you at all times. °· Change your dressing as told by your health care provider. °· Contact a health care provider if you have a fever or chills or if you have redness, swelling, or pain around your port insertion site. °· Keep all follow-up visits as told by your health care provider. °This information is not intended to replace advice given to you by your health care provider. Make sure you discuss any questions you have with your health care provider. °Document Revised: 02/06/2018 Document Reviewed: 02/06/2018 °Elsevier Patient Education ©  2020 [employer] ° °

## 2020-02-07 MED FILL — DRONABINOL 5 MG CAPSULE: 5 | 30 days supply | Qty: 60 | Fill #0

## 2020-02-11 ENCOUNTER — Encounter (HOSPITAL_COMMUNITY): Payer: Self-pay

## 2020-02-11 NOTE — Progress Notes (Signed)
Rayburn Go Female, 63 y.o., 10-11-1955 MRN:  [id number] Phone:  [phone] Jerilynn Mages) PCP:  [name], NP Primary Cvg:  [place] Medicare Next Appt With Oncology 03/17/2020 at 11:30 AM  RE: Biopsy Received: Today Message Details  Arne Cleveland, MD  Lenore Cordia Prob infectious/inflamm rec 28mo f/u CT  Spoke with [name]  Cancelled for now   DDH   Previous Messages  ----- Message -----  From: Lenore Cordia  Sent: 02/11/2020 10:01 AM EDT  To: Ir Procedure Requests  Subject: Biopsy                      FYI **December 25, 2019 in progress notes. Dr Vernard Gambles review , cancelling biopsy for now. **     Procedure Requested: CT BIOPSY    Reason for Procedure: History of smoking. History of localized bladder cancer. Now with left upper lung nodule.    Provider Requesting: [name]  Provider Telephone: [phone]    Other Info: RAD EXAMS IN Epic  ----- Message -----  From: Donita Brooks D  Sent: 02/11/2020  9:03 AM EDT  To: Lenore Cordia  Subject: BX                        Morey Hummingbird can you check the status of getting this BX scheduled per Dr Marin Olp?   Thanks  [employer]

## 2020-02-26 ENCOUNTER — Other Ambulatory Visit: Payer: Self-pay | Admitting: *Deleted

## 2020-02-26 DIAGNOSIS — C670 Malignant neoplasm of trigone of bladder: Secondary | ICD-10-CM

## 2020-02-26 MED ORDER — OXYCODONE HCL 5 MG PO TABS: 5.0000 mg | ORAL_TABLET | Freq: Four times a day (QID) | ORAL | 0 refills | Status: DC | PRN

## 2020-03-17 ENCOUNTER — Inpatient Hospital Stay: Payer: Medicare Other

## 2020-03-17 ENCOUNTER — Other Ambulatory Visit: Payer: Self-pay

## 2020-03-17 ENCOUNTER — Inpatient Hospital Stay (HOSPITAL_BASED_OUTPATIENT_CLINIC_OR_DEPARTMENT_OTHER): Payer: Medicare Other | Admitting: Hematology & Oncology

## 2020-03-17 ENCOUNTER — Inpatient Hospital Stay: Payer: Medicare Other | Attending: Hematology & Oncology

## 2020-03-17 VITALS — BP 136/86 | HR 93 | Temp 98.6°F | Resp 17 | Wt 75.0 lb

## 2020-03-17 DIAGNOSIS — Z79899 Other long term (current) drug therapy: Secondary | ICD-10-CM | POA: Insufficient documentation

## 2020-03-17 DIAGNOSIS — C670 Malignant neoplasm of trigone of bladder: Secondary | ICD-10-CM | POA: Diagnosis not present

## 2020-03-17 DIAGNOSIS — R911 Solitary pulmonary nodule: Secondary | ICD-10-CM | POA: Diagnosis not present

## 2020-03-17 DIAGNOSIS — C672 Malignant neoplasm of lateral wall of bladder: Secondary | ICD-10-CM

## 2020-03-17 DIAGNOSIS — C779 Secondary and unspecified malignant neoplasm of lymph node, unspecified: Secondary | ICD-10-CM | POA: Diagnosis not present

## 2020-03-17 DIAGNOSIS — Z5112 Encounter for antineoplastic immunotherapy: Secondary | ICD-10-CM | POA: Diagnosis not present

## 2020-03-17 LAB — CMP (CANCER CENTER ONLY)
ALT: 15 U/L (ref 0–44)
AST: 16 U/L (ref 15–41)
Albumin: 4.2 g/dL (ref 3.5–5.0)
Alkaline Phosphatase: 91 U/L (ref 38–126)
Anion gap: 6 (ref 5–15)
BUN: 12 mg/dL (ref 8–23)
CO2: 29 mmol/L (ref 22–32)
Calcium: 9.9 mg/dL (ref 8.9–10.3)
Chloride: 106 mmol/L (ref 98–111)
Creatinine: 0.9 mg/dL (ref 0.44–1.00)
GFR, Est AFR Am: >60 mL/min (ref >60)
GFR, Estimated: >60 mL/min (ref >60)
Glucose, Bld: 89 mg/dL (ref 70–99)
Potassium: 4.1 mmol/L (ref 3.5–5.1)
Sodium: 141 mmol/L (ref 135–145)
Total Bilirubin: 0.3 mg/dL (ref 0.3–1.2)
Total Protein: 7 g/dL (ref 6.5–8.1)

## 2020-03-17 LAB — CBC WITH DIFFERENTIAL (CANCER CENTER ONLY)
Abs Immature Granulocytes: 0.01 10*3/uL (ref 0.00–0.07)
Basophils Absolute: 0 10*3/uL (ref 0.0–0.1)
Basophils Relative: 0 %
Eosinophils Absolute: 0.1 10*3/uL (ref 0.0–0.5)
Eosinophils Relative: 2 %
HCT: 39.9 % (ref 36.0–46.0)
Hemoglobin: 13.2 g/dL (ref 12.0–15.0)
Immature Granulocytes: 0 %
Lymphocytes Relative: 24 %
Lymphs Abs: 0.9 10*3/uL (ref 0.7–4.0)
MCH: 32 pg (ref 26.0–34.0)
MCHC: 33.1 g/dL (ref 30.0–36.0)
MCV: 96.6 fL (ref 80.0–100.0)
Monocytes Absolute: 0.4 10*3/uL (ref 0.1–1.0)
Monocytes Relative: 10 %
Neutro Abs: 2.4 10*3/uL (ref 1.7–7.7)
Neutrophils Relative %: 64 %
Platelet Count: 167 10*3/uL (ref 150–400)
RBC: 4.13 MIL/uL (ref 3.87–5.11)
RDW: 13.7 % (ref 11.5–15.5)
WBC Count: 3.7 10*3/uL — ABNORMAL LOW (ref 4.0–10.5)
nRBC: 0 % (ref 0.0–0.2)

## 2020-03-17 LAB — LACTATE DEHYDROGENASE: LDH: 244 U/L — ABNORMAL HIGH (ref 98–192)

## 2020-03-17 MED ORDER — SODIUM CHLORIDE 0.9% FLUSH
10.0000 mL | INTRAVENOUS | Status: DC | PRN
  Administered 2020-03-17: 10 mL
  Filled 2020-03-17: qty 10

## 2020-03-17 MED ORDER — HEPARIN SOD (PORK) LOCK FLUSH 100 UNIT/ML IV SOLN
500.0000 [IU] | Freq: Once | INTRAVENOUS | Status: AC | PRN
  Administered 2020-03-17: 500 [IU]
  Filled 2020-03-17: qty 5

## 2020-03-17 MED ORDER — DIAZEPAM 5 MG PO TABS: 5.0000 mg | ORAL_TABLET | Freq: Three times a day (TID) | ORAL | 0 refills | Status: DC | PRN

## 2020-03-17 MED ORDER — SODIUM CHLORIDE 0.9 % IV SOLN
400.0000 mg | Freq: Once | INTRAVENOUS | Status: AC
  Administered 2020-03-17: 400 mg via INTRAVENOUS
  Filled 2020-03-17: qty 16

## 2020-03-17 MED ORDER — SODIUM CHLORIDE 0.9 % IV SOLN
Freq: Once | INTRAVENOUS | Status: AC
  Filled 2020-03-17: qty 250

## 2020-03-17 NOTE — Patient Instructions (Signed)
Implanted Port Insertion, Care After °This sheet gives you information about how to care for yourself after your procedure. Your health care provider may also give you more specific instructions. If you have problems or questions, contact your health care provider. °What can I expect after the procedure? °After the procedure, it is common to have: °· Discomfort at the port insertion site. °· Bruising on the skin over the port. This should improve over 3-4 days. °Follow these instructions at home: °Port care °· After your port is placed, you will get a manufacturer's information card. The card has information about your port. Keep this card with you at all times. °· Take care of the port as told by your health care provider. Ask your health care provider if you or a family member can get training for taking care of the port at home. A home health care nurse may also take care of the port. °· Make sure to remember what type of port you have. °Incision care ° °  ° °· Follow instructions from your health care provider about how to take care of your port insertion site. Make sure you: °? Wash your hands with soap and water before and after you change your bandage (dressing). If soap and water are not available, use hand sanitizer. °? Change your dressing as told by your health care provider. °? Leave stitches (sutures), skin glue, or adhesive strips in place. These skin closures may need to stay in place for 2 weeks or longer. If adhesive strip edges start to loosen and curl up, you may trim the loose edges. Do not remove adhesive strips completely unless your health care provider tells you to do that. °· Check your port insertion site every day for signs of infection. Check for: °? Redness, swelling, or pain. °? Fluid or blood. °? Warmth. °? Pus or a bad smell. °Activity °· Return to your normal activities as told by your health care provider. Ask your health care provider what activities are safe for you. °· Do not  lift anything that is heavier than 10 lb (4.5 kg), or the limit that you are told, until your health care provider says that it is safe. °General instructions °· Take over-the-counter and prescription medicines only as told by your health care provider. °· Do not take baths, swim, or use a hot tub until your health care provider approves. Ask your health care provider if you may take showers. You may only be allowed to take sponge baths. °· Do not drive for 24 hours if you were given a sedative during your procedure. °· Wear a medical alert bracelet in case of an emergency. This will tell any health care providers that you have a port. °· Keep all follow-up visits as told by your health care provider. This is important. °Contact a health care provider if: °· You cannot flush your port with saline as directed, or you cannot draw blood from the port. °· You have a fever or chills. °· You have redness, swelling, or pain around your port insertion site. °· You have fluid or blood coming from your port insertion site. °· Your port insertion site feels warm to the touch. °· You have pus or a bad smell coming from the port insertion site. °Get help right away if: °· You have chest pain or shortness of breath. °· You have bleeding from your port that you cannot control. °Summary °· Take care of the port as told by your health   care provider. Keep the manufacturer's information card with you at all times. °· Change your dressing as told by your health care provider. °· Contact a health care provider if you have a fever or chills or if you have redness, swelling, or pain around your port insertion site. °· Keep all follow-up visits as told by your health care provider. °This information is not intended to replace advice given to you by your health care provider. Make sure you discuss any questions you have with your health care provider. °Document Revised: 02/06/2018 Document Reviewed: 02/06/2018 °Elsevier Patient Education ©  2020 [employer] ° °

## 2020-03-17 NOTE — Progress Notes (Signed)
Hematology and Oncology Follow Up Visit  Donna Curry [id number] Aug 24, 1955 64 y.o. 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 6 week --s/pcycle#12 -- startedon 11/14/2018 -- changed on 03/06/2019             Interim History:  [name] is here today for follow-up and treatment.  The problem that we have been having is trying to make a diagnosis of this lung nodule in the left upper lung.  She has not been able to get a biopsy.  Radiology does not think that this can be done safely.  She does smoke.  She probably smokes at least half pack a day right now.  We will see about another CT scan.  We will see if this nodule is changed.  If we cannot get a biopsy, and I doubt that we will be able to, we might want to just see if radiation oncology will consider radiosurgery.  She does have bad arthritis.  She really needs to see her family doctor for this.  Being that she only weighs 75 pounds certainly not helping.  She has had no obvious change in bowel or bladder habits.  She does have some urinary frequency.  She has had no bleeding.  Is been no fever.  She has had no leg swelling.  Overall, her performance status is ECOG 2.    Medications:  Allergies as of 03/17/2020   No Known Allergies     Medication List       Accurate as of March 17, 2020 12:11 PM. If you have any questions, ask your nurse or doctor.        acetaminophen 500 MG tablet Commonly known as: TYLENOL Take 1,000 mg by mouth every 8 (eight) hours as needed for moderate pain.   albuterol 108 (90 Base) MCG/ACT inhaler Commonly known as: VENTOLIN HFA Inhale 2 puffs into the lungs every 6 (six) hours as needed for wheezing or shortness of  breath.   albuterol (2.5 MG/3ML) 0.083% nebulizer solution Commonly known as: PROVENTIL Take 3 mLs (2.5 mg total) by nebulization every 6 (six) hours as needed for wheezing or shortness of breath.   amitriptyline 10 MG tablet Commonly known as: ELAVIL Take 1 tablet (10 mg total) by mouth at bedtime as needed for sleep.   amLODipine 10 MG tablet Commonly known as: NORVASC Take 1 tablet (10 mg total) by mouth daily.   aspirin EC 81 MG tablet Take 81 mg by mouth daily.   citalopram 10 MG tablet Commonly known as: CeleXA Take 1 tablet (10 mg total) by mouth daily.   dronabinol 5 MG capsule Commonly known as: MARINOL Take 1 capsule (5 mg total) by mouth 2 (two) times daily before lunch and supper.   lidocaine-prilocaine cream Commonly known as: EMLA Apply to affected area once   megestrol 20 MG tablet Commonly known as: MEGACE Take 1 tablet (20 mg total) by mouth daily.   ondansetron 4 MG disintegrating tablet Commonly known as: Zofran ODT Take 1 tablet (4 mg total) by mouth every 8 (eight) hours as needed for nausea or vomiting.   ondansetron 8 MG tablet Commonly known as: ZOFRAN Take 1 tablet (8 mg total) by mouth every 8 (eight) hours as  needed for nausea or vomiting.   oxyCODONE 5 MG immediate release tablet Commonly known as: Oxy IR/ROXICODONE Take 1 tablet (5 mg total) by mouth every 6 (six) hours as needed for severe pain.   prochlorperazine 10 MG tablet Commonly known as: COMPAZINE Take 1 tablet (10 mg total) by mouth every 6 (six) hours as needed for nausea or vomiting.   Trelegy Ellipta 100-62.5-25 MCG/INH Aepb Generic drug: Fluticasone-Umeclidin-Vilant INHALE 1 PUFF ONCE DAILY   Vitamin D (Ergocalciferol) 1.25 MG (50000 UNIT) Caps capsule Commonly known as: DRISDOL Take 1 capsule by mouth once a week       Allergies: No Known Allergies  Past Medical History, Surgical history, Social history, and Family History were reviewed and updated.  Review of  Systems: Review of Systems  Constitutional: Positive for malaise/fatigue.  HENT: Negative.   Eyes: Negative.   Respiratory: Positive for wheezing.   Cardiovascular: Negative.   Gastrointestinal: Positive for nausea.  Genitourinary: Positive for frequency.  Musculoskeletal: Positive for back pain, joint pain and neck pain.  Skin: Negative.   Neurological: Negative.   Endo/Heme/Allergies: Negative.   Psychiatric/Behavioral: Negative.      Physical Exam:  weight is 75 lb (34 kg). Her oral temperature is 98.6 F (37 C). Her blood pressure is 136/86 and her pulse is 93. Her respiration is 17 and oxygen saturation is 98%.   Wt Readings from Last 3 Encounters:  03/17/20 75 lb (34 kg)  02/04/20 76 lb 6 oz (34.6 kg)  12/24/19 74 lb 0.4 oz (33.6 kg)    Physical Exam Vitals reviewed.  HENT:     Head: Normocephalic and atraumatic.  Eyes:     Pupils: Pupils are equal, round, and reactive to light.  Cardiovascular:     Rate and Rhythm: Normal rate and regular rhythm.     Heart sounds: Normal heart sounds.  Pulmonary:     Effort: Pulmonary effort is normal.     Breath sounds: Normal breath sounds.  Abdominal:     General: Bowel sounds are normal.     Palpations: Abdomen is soft.  Musculoskeletal:        General: No tenderness or deformity. Normal range of motion.     Cervical back: Normal range of motion.  Lymphadenopathy:     Cervical: No cervical adenopathy.  Skin:    General: Skin is warm and dry.     Findings: No erythema or rash.  Neurological:     Mental Status: She is alert and oriented to person, place, and time.  Psychiatric:        Behavior: Behavior normal.        Thought Content: Thought content normal.        Judgment: Judgment normal.      Lab Results  Component Value Date   WBC 3.7 (L) 03/17/2020   HGB 13.2 03/17/2020   HCT 39.9 03/17/2020   MCV 96.6 03/17/2020   PLT 167 03/17/2020   Lab Results  Component Value Date   FERRITIN 10 (L) 12/26/2018     IRON 34 (L) 12/26/2018   TIBC 353 12/26/2018   UIBC 319 12/26/2018   IRONPCTSAT 10 (L) 12/26/2018   Lab Results  Component Value Date   RBC 4.13 03/17/2020   No results found for: KPAFRELGTCHN, LAMBDASER, KAPLAMBRATIO No results found for: IGGSERUM, IGA, IGMSERUM No results found for: TOTALPROTELP, ALBUMINELP, A1GS, A2GS, BETS, BETA2SER, GAMS, MSPIKE, SPEI   Chemistry      Component Value Date/Time   NA 141  03/17/2020 1141   K 4.1 03/17/2020 1141   CL 106 03/17/2020 1141   CO2 29 03/17/2020 1141   BUN 12 03/17/2020 1141   CREATININE 0.90 03/17/2020 1141   CREATININE 1.10 08/20/2013 1027      Component Value Date/Time   CALCIUM 9.9 03/17/2020 1141   ALKPHOS 91 03/17/2020 1141   AST 16 03/17/2020 1141   ALT 15 03/17/2020 1141   BILITOT 0.3 03/17/2020 1141       Impression and Plan: Ms. Steinhaus is a very pleasant 64 yo African American female with 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  She has responded very well to immunotherapy.  This lung nodule, I really suspect, is probably going to be a primary bronchogenic carcinoma.  Again, I thought a biopsy should be easy to obtain.  This is not the case.  As such, we just may not be able to do a biopsy and may have to treat her without tissue.  We will repeat a CT scan of the chest.  If there is growth in the nodule, then we will clearly get her over to Radiation Oncology and hopefully they will be able to treat her with radiosurgery.  I will send in some Valium for her.  She says that the oxycodone does not help the pain so we will discontinue this.  Unfortunately, try to use anti-inflammatories is probably very risky with her given her overall status.  We will move ahead with her 13th cycle of treatment.  She is really done well with this.  I will plan to get her back in a month to so we can follow-up with his lung nodule and its evaluation.  [name], MD 8/24/202112:11 PM

## 2020-03-17 NOTE — Patient Instructions (Signed)
Pembrolizumab injection What is this medicine? PEMBROLIZUMAB (pem broe liz ue mab) is a monoclonal antibody. It is used to treat certain types of cancer. This medicine may be used for other purposes; ask your health care provider or pharmacist if you have questions. COMMON BRAND NAME(S): Keytruda What should I tell my health care provider before I take this medicine? They need to know if you have any of these conditions:  diabetes  immune system problems  inflammatory bowel disease  liver disease  lung or breathing disease  lupus  received or scheduled to receive an organ transplant or a stem-cell transplant that uses donor stem cells  an unusual or allergic reaction to pembrolizumab, other medicines, foods, dyes, or preservatives  pregnant or trying to get pregnant  breast-feeding How should I use this medicine? This medicine is for infusion into a vein. It is given by a health care professional in a hospital or clinic setting. A special MedGuide will be given to you before each treatment. Be sure to read this information carefully each time. Talk to your pediatrician regarding the use of this medicine in children. While this drug may be prescribed for children as young as 6 months for selected conditions, precautions do apply. Overdosage: If you think you have taken too much of this medicine contact a poison control center or emergency room at once. NOTE: This medicine is only for you. Do not share this medicine with others. What if I miss a dose? It is important not to miss your dose. Call your doctor or health care professional if you are unable to keep an appointment. What may interact with this medicine? Interactions have not been studied. Give your health care provider a list of all the medicines, herbs, non-prescription drugs, or dietary supplements you use. Also tell them if you smoke, drink alcohol, or use illegal drugs. Some items may interact with your medicine. This  list may not describe all possible interactions. Give your health care provider a list of all the medicines, herbs, non-prescription drugs, or dietary supplements you use. Also tell them if you smoke, drink alcohol, or use illegal drugs. Some items may interact with your medicine. What should I watch for while using this medicine? Your condition will be monitored carefully while you are receiving this medicine. You may need blood work done while you are taking this medicine. Do not become pregnant while taking this medicine or for 4 months after stopping it. Women should inform their doctor if they wish to become pregnant or think they might be pregnant. There is a potential for serious side effects to an unborn child. Talk to your health care professional or pharmacist for more information. Do not breast-feed an infant while taking this medicine or for 4 months after the last dose. What side effects may I notice from receiving this medicine? Side effects that you should report to your doctor or health care professional as soon as possible:  allergic reactions like skin rash, itching or hives, swelling of the face, lips, or tongue  bloody or black, tarry  breathing problems  changes in vision  chest pain  chills  confusion  constipation  cough  diarrhea  dizziness or feeling faint or lightheaded  fast or irregular heartbeat  fever  flushing  joint pain  low blood counts - this medicine may decrease the number of white blood cells, red blood cells and platelets. You may be at increased risk for infections and bleeding.  muscle pain  muscle   weakness  pain, tingling, numbness in the hands or feet  persistent headache  redness, blistering, peeling or loosening of the skin, including inside the mouth  signs and symptoms of high blood sugar such as dizziness; dry mouth; dry skin; fruity breath; nausea; stomach pain; increased hunger or thirst; increased urination  signs  and symptoms of kidney injury like trouble passing urine or change in the amount of urine  signs and symptoms of liver injury like dark urine, light-colored stools, loss of appetite, nausea, right upper belly pain, yellowing of the eyes or skin  sweating  swollen lymph nodes  weight loss Side effects that usually do not require medical attention (report to your doctor or health care professional if they continue or are bothersome):  decreased appetite  hair loss  muscle pain  tiredness This list may not describe all possible side effects. Call your doctor for medical advice about side effects. You may report side effects to FDA at 1-800-FDA-1088. Where should I keep my medicine? This drug is given in a hospital or clinic and will not be stored at home. NOTE: This sheet is a summary. It may not cover all possible information. If you have questions about this medicine, talk to your doctor, pharmacist, or health care provider.  2020 Elsevier/Gold Standard (2019-05-17 18:07:58)  

## 2020-03-18 LAB — TSH: TSH: 0.552 u[IU]/mL (ref 0.308–3.960)

## 2020-04-13 ENCOUNTER — Inpatient Hospital Stay: Payer: Medicare Other

## 2020-04-13 ENCOUNTER — Telehealth: Payer: Self-pay | Admitting: Hematology & Oncology

## 2020-04-13 ENCOUNTER — Inpatient Hospital Stay (HOSPITAL_BASED_OUTPATIENT_CLINIC_OR_DEPARTMENT_OTHER): Payer: Medicare Other | Admitting: Hematology & Oncology

## 2020-04-13 ENCOUNTER — Other Ambulatory Visit: Payer: Self-pay

## 2020-04-13 ENCOUNTER — Ambulatory Visit (HOSPITAL_BASED_OUTPATIENT_CLINIC_OR_DEPARTMENT_OTHER)
Admission: RE | Admit: 2020-04-13 | Discharge: 2020-04-13 | Disposition: A | Discharge status: Home / Self Care | Payer: Medicare Other | Source: Ambulatory Visit | Attending: Hematology & Oncology | Admitting: Hematology & Oncology

## 2020-04-13 ENCOUNTER — Inpatient Hospital Stay: Payer: Medicare Other | Attending: Hematology & Oncology

## 2020-04-13 ENCOUNTER — Other Ambulatory Visit: Payer: Self-pay | Admitting: *Deleted

## 2020-04-13 VITALS — BP 133/84 | HR 105 | Temp 98.0°F | Resp 18 | Wt 73.0 lb

## 2020-04-13 DIAGNOSIS — T451X5A Adverse effect of antineoplastic and immunosuppressive drugs, initial encounter: Secondary | ICD-10-CM

## 2020-04-13 DIAGNOSIS — Z95828 Presence of other vascular implants and grafts: Secondary | ICD-10-CM

## 2020-04-13 DIAGNOSIS — D500 Iron deficiency anemia secondary to blood loss (chronic): Secondary | ICD-10-CM | POA: Insufficient documentation

## 2020-04-13 DIAGNOSIS — F17210 Nicotine dependence, cigarettes, uncomplicated: Secondary | ICD-10-CM | POA: Insufficient documentation

## 2020-04-13 DIAGNOSIS — I2510 Atherosclerotic heart disease of native coronary artery without angina pectoris: Secondary | ICD-10-CM | POA: Diagnosis not present

## 2020-04-13 DIAGNOSIS — C670 Malignant neoplasm of trigone of bladder: Secondary | ICD-10-CM

## 2020-04-13 DIAGNOSIS — I700 Atherosclerosis of aorta: Secondary | ICD-10-CM | POA: Diagnosis not present

## 2020-04-13 DIAGNOSIS — R911 Solitary pulmonary nodule: Secondary | ICD-10-CM | POA: Diagnosis not present

## 2020-04-13 DIAGNOSIS — G8929 Other chronic pain: Secondary | ICD-10-CM | POA: Diagnosis not present

## 2020-04-13 DIAGNOSIS — J439 Emphysema, unspecified: Secondary | ICD-10-CM | POA: Diagnosis not present

## 2020-04-13 DIAGNOSIS — C672 Malignant neoplasm of lateral wall of bladder: Secondary | ICD-10-CM | POA: Diagnosis not present

## 2020-04-13 DIAGNOSIS — C779 Secondary and unspecified malignant neoplasm of lymph node, unspecified: Secondary | ICD-10-CM | POA: Insufficient documentation

## 2020-04-13 DIAGNOSIS — R110 Nausea: Secondary | ICD-10-CM

## 2020-04-13 DIAGNOSIS — R630 Anorexia: Secondary | ICD-10-CM | POA: Diagnosis not present

## 2020-04-13 DIAGNOSIS — J984 Other disorders of lung: Secondary | ICD-10-CM | POA: Diagnosis not present

## 2020-04-13 LAB — CMP (CANCER CENTER ONLY)
ALT: 10 U/L (ref 0–44)
AST: 13 U/L — ABNORMAL LOW (ref 15–41)
Albumin: 4 g/dL (ref 3.5–5.0)
Alkaline Phosphatase: 93 U/L (ref 38–126)
Anion gap: 6 (ref 5–15)
BUN: 10 mg/dL (ref 8–23)
CO2: 31 mmol/L (ref 22–32)
Calcium: 9.5 mg/dL (ref 8.9–10.3)
Chloride: 105 mmol/L (ref 98–111)
Creatinine: 0.98 mg/dL (ref 0.44–1.00)
GFR, Est AFR Am: >60 mL/min (ref >60)
GFR, Estimated: >60 mL/min (ref >60)
Glucose, Bld: 92 mg/dL (ref 70–99)
Potassium: 4.5 mmol/L (ref 3.5–5.1)
Sodium: 142 mmol/L (ref 135–145)
Total Bilirubin: 0.3 mg/dL (ref 0.3–1.2)
Total Protein: 6.9 g/dL (ref 6.5–8.1)

## 2020-04-13 LAB — CBC WITH DIFFERENTIAL (CANCER CENTER ONLY)
Abs Immature Granulocytes: 0.02 10*3/uL (ref 0.00–0.07)
Basophils Absolute: 0 10*3/uL (ref 0.0–0.1)
Basophils Relative: 1 %
Eosinophils Absolute: 0.1 10*3/uL (ref 0.0–0.5)
Eosinophils Relative: 2 %
HCT: 41.3 % (ref 36.0–46.0)
Hemoglobin: 13.3 g/dL (ref 12.0–15.0)
Immature Granulocytes: 1 %
Lymphocytes Relative: 30 %
Lymphs Abs: 1.2 10*3/uL (ref 0.7–4.0)
MCH: 31.3 pg (ref 26.0–34.0)
MCHC: 32.2 g/dL (ref 30.0–36.0)
MCV: 97.2 fL (ref 80.0–100.0)
Monocytes Absolute: 0.4 10*3/uL (ref 0.1–1.0)
Monocytes Relative: 11 %
Neutro Abs: 2.3 10*3/uL (ref 1.7–7.7)
Neutrophils Relative %: 55 %
Platelet Count: 170 10*3/uL (ref 150–400)
RBC: 4.25 MIL/uL (ref 3.87–5.11)
RDW: 13.5 % (ref 11.5–15.5)
WBC Count: 4.1 10*3/uL (ref 4.0–10.5)
nRBC: 0 % (ref 0.0–0.2)

## 2020-04-13 MED ORDER — HEPARIN SOD (PORK) LOCK FLUSH 100 UNIT/ML IV SOLN
500.0000 [IU] | Freq: Once | INTRAVENOUS | Status: AC
  Administered 2020-04-13: 500 [IU] via INTRAVENOUS
  Filled 2020-04-13: qty 5

## 2020-04-13 MED ORDER — OXYCODONE HCL 5 MG PO TABS: 5.0000 mg | ORAL_TABLET | Freq: Four times a day (QID) | ORAL | 0 refills | Status: DC | PRN

## 2020-04-13 MED ORDER — SODIUM CHLORIDE 0.9% FLUSH
10.0000 mL | INTRAVENOUS | Status: DC | PRN
  Administered 2020-04-13: 10 mL via INTRAVENOUS
  Filled 2020-04-13: qty 10

## 2020-04-13 MED ORDER — DRONABINOL 5 MG PO CAPS: 5.0000 mg | ORAL_CAPSULE | Freq: Two times a day (BID) | ORAL | 0 refills | Status: DC

## 2020-04-13 MED FILL — DRONABINOL 5 MG CAPSULE: 5 | 30 days supply | Qty: 60 | Fill #0

## 2020-04-13 NOTE — Progress Notes (Signed)
Hematology and Oncology Follow Up Visit  Donna Curry [id number] [birth year] 64 y.o. 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 6 week --s/pcycle#12 -- startedon 11/14/2018 -- changed on 03/06/2019             Interim History:  Donna Curry is here today for follow-up and treatment.  So far, she is doing quite well.  She still smoking half pack of cigarettes a day.  She is trying to cut back on this.  We did do a CT scan on her chest today.  Thankfully, the tumors that she has are smaller.  The left upper lobe lesion now measures 1.1 x 0.7 x 2.3 cm.  A right upper lobe nodule now measures 0.7 x 0.3 cm.  If these are primary tumors or metastatic tumors, they seem to be responding to the pembrolizumab.  Her weight is holding stable.  The Marinol seems to be helping her.  She has had no problems with bowels or bladder.  There has been no bleeding.  She has had no rashes.  Pain wise, seems to be doing okay with the oxycodone.  She does have chronic pain.   Overall, her performance status is ECOG 2.    Medications:  Allergies as of 04/13/2020   No Known Allergies     Medication List       Accurate as of April 13, 2020  2:32 PM. If you have any questions, ask your nurse or doctor.        acetaminophen 500 MG tablet Commonly known as: TYLENOL Take 1,000 mg by mouth every 8 (eight) hours as needed for moderate pain.   albuterol 108 (90 Base) MCG/ACT inhaler Commonly known as: VENTOLIN HFA Inhale 2 puffs into the lungs every 6 (six) hours as needed for wheezing or shortness of breath.   albuterol (2.5 MG/3ML) 0.083% nebulizer solution Commonly known as: PROVENTIL Take 3 mLs (2.5 mg total) by nebulization every  6 (six) hours as needed for wheezing or shortness of breath.   amitriptyline 10 MG tablet Commonly known as: ELAVIL Take 1 tablet (10 mg total) by mouth at bedtime as needed for sleep.   amLODipine 10 MG tablet Commonly known as: NORVASC Take 1 tablet (10 mg total) by mouth daily.   aspirin EC 81 MG tablet Take 81 mg by mouth daily.   citalopram 10 MG tablet Commonly known as: CeleXA Take 1 tablet (10 mg total) by mouth daily.   diazepam 5 MG tablet Commonly known as: VALIUM Take 1 tablet (5 mg total) by mouth every 8 (eight) hours as needed for anxiety.   dronabinol 5 MG capsule Commonly known as: MARINOL Take 1 capsule (5 mg total) by mouth 2 (two) times daily before lunch and supper.   lidocaine-prilocaine cream Commonly known as: EMLA Apply to affected area once   megestrol 20 MG tablet Commonly known as: MEGACE Take 1 tablet (20 mg total) by mouth daily.   ondansetron 4 MG disintegrating tablet Commonly known as: Zofran ODT Take 1 tablet (4 mg total) by mouth every 8 (eight) hours as needed for nausea or vomiting.   ondansetron 8 MG tablet Commonly known as: ZOFRAN Take 1 tablet (8 mg total) by mouth every 8 (eight) hours  as needed for nausea or vomiting.   prochlorperazine 10 MG tablet Commonly known as: COMPAZINE Take 1 tablet (10 mg total) by mouth every 6 (six) hours as needed for nausea or vomiting.   Trelegy Ellipta 100-62.5-25 MCG/INH Aepb Generic drug: Fluticasone-Umeclidin-Vilant INHALE 1 PUFF ONCE DAILY   Vitamin D (Ergocalciferol) 1.25 MG (50000 UNIT) Caps capsule Commonly known as: DRISDOL Take 1 capsule by mouth once a week       Allergies: No Known Allergies  Past Medical History, Surgical history, Social history, and Family History were reviewed and updated.  Review of Systems: Review of Systems  Constitutional: Positive for malaise/fatigue.  HENT: Negative.   Eyes: Negative.   Respiratory: Positive for wheezing.   Cardiovascular:  Negative.   Gastrointestinal: Positive for nausea.  Genitourinary: Positive for frequency.  Musculoskeletal: Positive for back pain, joint pain and neck pain.  Skin: Negative.   Neurological: Negative.   Endo/Heme/Allergies: Negative.   Psychiatric/Behavioral: Negative.      Physical Exam:  weight is 73 lb (33.1 kg). Her oral temperature is 98 F (36.7 C). Her blood pressure is 133/84 and her pulse is 105 (abnormal). Her respiration is 18 and oxygen saturation is 100%.   Wt Readings from Last 3 Encounters:  04/13/20 73 lb (33.1 kg)  03/17/20 75 lb (34 kg)  02/04/20 76 lb 6 oz (34.6 kg)    Physical Exam Vitals reviewed.  HENT:     Head: Normocephalic and atraumatic.  Eyes:     Pupils: Pupils are equal, round, and reactive to light.  Cardiovascular:     Rate and Rhythm: Normal rate and regular rhythm.     Heart sounds: Normal heart sounds.  Pulmonary:     Effort: Pulmonary effort is normal.     Breath sounds: Normal breath sounds.  Abdominal:     General: Bowel sounds are normal.     Palpations: Abdomen is soft.  Musculoskeletal:        General: No tenderness or deformity. Normal range of motion.     Cervical back: Normal range of motion.  Lymphadenopathy:     Cervical: No cervical adenopathy.  Skin:    General: Skin is warm and dry.     Findings: No erythema or rash.  Neurological:     Mental Status: She is alert and oriented to person, place, and time.  Psychiatric:        Behavior: Behavior normal.        Thought Content: Thought content normal.        Judgment: Judgment normal.      Lab Results  Component Value Date   WBC 4.1 04/13/2020   HGB 13.3 04/13/2020   HCT 41.3 04/13/2020   MCV 97.2 04/13/2020   PLT 170 04/13/2020   Lab Results  Component Value Date   FERRITIN 10 (L) 12/26/2018   IRON 34 (L) 12/26/2018   TIBC 353 12/26/2018   UIBC 319 12/26/2018   IRONPCTSAT 10 (L) 12/26/2018   Lab Results  Component Value Date   RBC 4.25 04/13/2020    No results found for: KPAFRELGTCHN, LAMBDASER, KAPLAMBRATIO No results found for: IGGSERUM, IGA, IGMSERUM No results found for: [name], SPEI   Chemistry      Component Value Date/Time   NA 142 04/13/2020 1345   K 4.5 04/13/2020 1345   CL 105 04/13/2020 1345   CO2 31 04/13/2020 1345   BUN 10 04/13/2020 1345   CREATININE 0.98 04/13/2020 1345  CREATININE 1.10 08/20/2013 1027      Component Value Date/Time   CALCIUM 9.5 04/13/2020 1345   ALKPHOS 93 04/13/2020 1345   AST 13 (L) 04/13/2020 1345   ALT 10 04/13/2020 1345   BILITOT 0.3 04/13/2020 1345       Impression and Plan: Ms. Ammon is a very pleasant 64 yo African American female with 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  She has responded very well to immunotherapy.  This lung nodule, I really suspect, is probably  a primary bronchogenic carcinoma.  Given the fact that everything is responding, I just do not think we have to pursue any further intervention right now.  I am sure that at some point, there probably will be a problem.  However, she seems to be doing quite well right now.  Her quality of life is quite good.  Her faith remains incredibly strong.  We will treat her with pembrolizumab today.  I will then plan to get her back in November and try to get her back on schedule.  I would not do another CT scan probably for 3 months.    [name], MD 9/20/20212:32 PM

## 2020-04-13 NOTE — Telephone Encounter (Signed)
Appointments scheduled calendar printed per 9/20 los

## 2020-04-13 NOTE — Patient Instructions (Signed)
Implanted Port Insertion, Care After °This sheet gives you information about how to care for yourself after your procedure. Your health care provider may also give you more specific instructions. If you have problems or questions, contact your health care provider. °What can I expect after the procedure? °After the procedure, it is common to have: °· Discomfort at the port insertion site. °· Bruising on the skin over the port. This should improve over 3-4 days. °Follow these instructions at home: °Port care °· After your port is placed, you will get a manufacturer's information card. The card has information about your port. Keep this card with you at all times. °· Take care of the port as told by your health care provider. Ask your health care provider if you or a family member can get training for taking care of the port at home. A home health care nurse may also take care of the port. °· Make sure to remember what type of port you have. °Incision care ° °  ° °· Follow instructions from your health care provider about how to take care of your port insertion site. Make sure you: °? Wash your hands with soap and water before and after you change your bandage (dressing). If soap and water are not available, use hand sanitizer. °? Change your dressing as told by your health care provider. °? Leave stitches (sutures), skin glue, or adhesive strips in place. These skin closures may need to stay in place for 2 weeks or longer. If adhesive strip edges start to loosen and curl up, you may trim the loose edges. Do not remove adhesive strips completely unless your health care provider tells you to do that. °· Check your port insertion site every day for signs of infection. Check for: °? Redness, swelling, or pain. °? Fluid or blood. °? Warmth. °? Pus or a bad smell. °Activity °· Return to your normal activities as told by your health care provider. Ask your health care provider what activities are safe for you. °· Do not  lift anything that is heavier than 10 lb (4.5 kg), or the limit that you are told, until your health care provider says that it is safe. °General instructions °· Take over-the-counter and prescription medicines only as told by your health care provider. °· Do not take baths, swim, or use a hot tub until your health care provider approves. Ask your health care provider if you may take showers. You may only be allowed to take sponge baths. °· Do not drive for 24 hours if you were given a sedative during your procedure. °· Wear a medical alert bracelet in case of an emergency. This will tell any health care providers that you have a port. °· Keep all follow-up visits as told by your health care provider. This is important. °Contact a health care provider if: °· You cannot flush your port with saline as directed, or you cannot draw blood from the port. °· You have a fever or chills. °· You have redness, swelling, or pain around your port insertion site. °· You have fluid or blood coming from your port insertion site. °· Your port insertion site feels warm to the touch. °· You have pus or a bad smell coming from the port insertion site. °Get help right away if: °· You have chest pain or shortness of breath. °· You have bleeding from your port that you cannot control. °Summary °· Take care of the port as told by your health   care provider. Keep the manufacturer's information card with you at all times. °· Change your dressing as told by your health care provider. °· Contact a health care provider if you have a fever or chills or if you have redness, swelling, or pain around your port insertion site. °· Keep all follow-up visits as told by your health care provider. °This information is not intended to replace advice given to you by your health care provider. Make sure you discuss any questions you have with your health care provider. °Document Revised: 02/06/2018 Document Reviewed: 02/06/2018 °Elsevier Patient Education ©  2020 [employer] ° °

## 2020-04-28 ENCOUNTER — Inpatient Hospital Stay: Payer: Medicare Other | Attending: Hematology & Oncology

## 2020-04-28 ENCOUNTER — Ambulatory Visit: Payer: Medicare Other | Admitting: Family

## 2020-04-28 ENCOUNTER — Other Ambulatory Visit: Payer: Self-pay

## 2020-04-28 ENCOUNTER — Inpatient Hospital Stay: Payer: Medicare Other

## 2020-04-28 VITALS — BP 132/85 | HR 84 | Temp 98.9°F | Resp 18

## 2020-04-28 DIAGNOSIS — C672 Malignant neoplasm of lateral wall of bladder: Secondary | ICD-10-CM

## 2020-04-28 DIAGNOSIS — R110 Nausea: Secondary | ICD-10-CM

## 2020-04-28 DIAGNOSIS — C670 Malignant neoplasm of trigone of bladder: Secondary | ICD-10-CM

## 2020-04-28 DIAGNOSIS — C779 Secondary and unspecified malignant neoplasm of lymph node, unspecified: Secondary | ICD-10-CM | POA: Insufficient documentation

## 2020-04-28 DIAGNOSIS — Z79899 Other long term (current) drug therapy: Secondary | ICD-10-CM | POA: Diagnosis not present

## 2020-04-28 DIAGNOSIS — Z5112 Encounter for antineoplastic immunotherapy: Secondary | ICD-10-CM | POA: Diagnosis not present

## 2020-04-28 DIAGNOSIS — T451X5A Adverse effect of antineoplastic and immunosuppressive drugs, initial encounter: Secondary | ICD-10-CM

## 2020-04-28 LAB — CBC WITH DIFFERENTIAL (CANCER CENTER ONLY)
Abs Immature Granulocytes: 0.01 10*3/uL (ref 0.00–0.07)
Basophils Absolute: 0 10*3/uL (ref 0.0–0.1)
Basophils Relative: 1 %
Eosinophils Absolute: 0.1 10*3/uL (ref 0.0–0.5)
Eosinophils Relative: 1 %
HCT: 37.1 % (ref 36.0–46.0)
Hemoglobin: 12.2 g/dL (ref 12.0–15.0)
Immature Granulocytes: 0 %
Lymphocytes Relative: 22 %
Lymphs Abs: 0.8 10*3/uL (ref 0.7–4.0)
MCH: 31.7 pg (ref 26.0–34.0)
MCHC: 32.9 g/dL (ref 30.0–36.0)
MCV: 96.4 fL (ref 80.0–100.0)
Monocytes Absolute: 0.3 10*3/uL (ref 0.1–1.0)
Monocytes Relative: 8 %
Neutro Abs: 2.4 10*3/uL (ref 1.7–7.7)
Neutrophils Relative %: 68 %
Platelet Count: 172 10*3/uL (ref 150–400)
RBC: 3.85 MIL/uL — ABNORMAL LOW (ref 3.87–5.11)
RDW: 13.8 % (ref 11.5–15.5)
WBC Count: 3.6 10*3/uL — ABNORMAL LOW (ref 4.0–10.5)
nRBC: 0 % (ref 0.0–0.2)

## 2020-04-28 LAB — CMP (CANCER CENTER ONLY)
ALT: 8 U/L (ref 0–44)
AST: 12 U/L — ABNORMAL LOW (ref 15–41)
Albumin: 3.9 g/dL (ref 3.5–5.0)
Alkaline Phosphatase: 77 U/L (ref 38–126)
Anion gap: 6 (ref 5–15)
BUN: 10 mg/dL (ref 8–23)
CO2: 30 mmol/L (ref 22–32)
Calcium: 9.3 mg/dL (ref 8.9–10.3)
Chloride: 107 mmol/L (ref 98–111)
Creatinine: 0.85 mg/dL (ref 0.44–1.00)
GFR, Estimated: >60 mL/min (ref >60)
Glucose, Bld: 86 mg/dL (ref 70–99)
Potassium: 3.4 mmol/L — ABNORMAL LOW (ref 3.5–5.1)
Sodium: 143 mmol/L (ref 135–145)
Total Bilirubin: 0.3 mg/dL (ref 0.3–1.2)
Total Protein: 6.4 g/dL — ABNORMAL LOW (ref 6.5–8.1)

## 2020-04-28 MED ORDER — HEPARIN SOD (PORK) LOCK FLUSH 100 UNIT/ML IV SOLN
500.0000 [IU] | Freq: Once | INTRAVENOUS | Status: AC | PRN
  Administered 2020-04-28: 500 [IU]
  Filled 2020-04-28: qty 5

## 2020-04-28 MED ORDER — SODIUM CHLORIDE 0.9 % IV SOLN
Freq: Once | INTRAVENOUS | Status: AC
  Filled 2020-04-28: qty 250

## 2020-04-28 MED ORDER — SODIUM CHLORIDE 0.9% FLUSH
10.0000 mL | INTRAVENOUS | Status: DC | PRN
  Administered 2020-04-28: 10 mL
  Filled 2020-04-28: qty 10

## 2020-04-28 MED ORDER — SODIUM CHLORIDE 0.9 % IV SOLN
400.0000 mg | Freq: Once | INTRAVENOUS | Status: AC
  Administered 2020-04-28: 400 mg via INTRAVENOUS
  Filled 2020-04-28: qty 16

## 2020-04-28 NOTE — Patient Instructions (Signed)
Pembrolizumab injection What is this medicine? PEMBROLIZUMAB (pem broe liz ue mab) is a monoclonal antibody. It is used to treat certain types of cancer. This medicine may be used for other purposes; ask your health care provider or pharmacist if you have questions. COMMON BRAND NAME(S): Keytruda What should I tell my health care provider before I take this medicine? They need to know if you have any of these conditions:  diabetes  immune system problems  inflammatory bowel disease  liver disease  lung or breathing disease  lupus  received or scheduled to receive an organ transplant or a stem-cell transplant that uses donor stem cells  an unusual or allergic reaction to pembrolizumab, other medicines, foods, dyes, or preservatives  pregnant or trying to get pregnant  breast-feeding How should I use this medicine? This medicine is for infusion into a vein. It is given by a health care professional in a hospital or clinic setting. A special MedGuide will be given to you before each treatment. Be sure to read this information carefully each time. Talk to your pediatrician regarding the use of this medicine in children. While this drug may be prescribed for children as young as 6 months for selected conditions, precautions do apply. Overdosage: If you think you have taken too much of this medicine contact a poison control center or emergency room at once. NOTE: This medicine is only for you. Do not share this medicine with others. What if I miss a dose? It is important not to miss your dose. Call your doctor or health care professional if you are unable to keep an appointment. What may interact with this medicine? Interactions have not been studied. Give your health care provider a list of all the medicines, herbs, non-prescription drugs, or dietary supplements you use. Also tell them if you smoke, drink alcohol, or use illegal drugs. Some items may interact with your medicine. This  list may not describe all possible interactions. Give your health care provider a list of all the medicines, herbs, non-prescription drugs, or dietary supplements you use. Also tell them if you smoke, drink alcohol, or use illegal drugs. Some items may interact with your medicine. What should I watch for while using this medicine? Your condition will be monitored carefully while you are receiving this medicine. You may need blood work done while you are taking this medicine. Do not become pregnant while taking this medicine or for 4 months after stopping it. Women should inform their doctor if they wish to become pregnant or think they might be pregnant. There is a potential for serious side effects to an unborn child. Talk to your health care professional or pharmacist for more information. Do not breast-feed an infant while taking this medicine or for 4 months after the last dose. What side effects may I notice from receiving this medicine? Side effects that you should report to your doctor or health care professional as soon as possible:  allergic reactions like skin rash, itching or hives, swelling of the face, lips, or tongue  bloody or black, tarry  breathing problems  changes in vision  chest pain  chills  confusion  constipation  cough  diarrhea  dizziness or feeling faint or lightheaded  fast or irregular heartbeat  fever  flushing  joint pain  low blood counts - this medicine may decrease the number of white blood cells, red blood cells and platelets. You may be at increased risk for infections and bleeding.  muscle pain  muscle   weakness  pain, tingling, numbness in the hands or feet  persistent headache  redness, blistering, peeling or loosening of the skin, including inside the mouth  signs and symptoms of high blood sugar such as dizziness; dry mouth; dry skin; fruity breath; nausea; stomach pain; increased hunger or thirst; increased urination  signs  and symptoms of kidney injury like trouble passing urine or change in the amount of urine  signs and symptoms of liver injury like dark urine, light-colored stools, loss of appetite, nausea, right upper belly pain, yellowing of the eyes or skin  sweating  swollen lymph nodes  weight loss Side effects that usually do not require medical attention (report to your doctor or health care professional if they continue or are bothersome):  decreased appetite  hair loss  muscle pain  tiredness This list may not describe all possible side effects. Call your doctor for medical advice about side effects. You may report side effects to FDA at 1-800-FDA-1088. Where should I keep my medicine? This drug is given in a hospital or clinic and will not be stored at home. NOTE: This sheet is a summary. It may not cover all possible information. If you have questions about this medicine, talk to your doctor, pharmacist, or health care provider.  2020 Elsevier/Gold Standard (2019-05-17 18:07:58)  

## 2020-04-28 NOTE — Patient Instructions (Signed)
Implanted Port Insertion, Care After °This sheet gives you information about how to care for yourself after your procedure. Your health care provider may also give you more specific instructions. If you have problems or questions, contact your health care provider. °What can I expect after the procedure? °After the procedure, it is common to have: °· Discomfort at the port insertion site. °· Bruising on the skin over the port. This should improve over 3-4 days. °Follow these instructions at home: °Port care °· After your port is placed, you will get a manufacturer's information card. The card has information about your port. Keep this card with you at all times. °· Take care of the port as told by your health care provider. Ask your health care provider if you or a family member can get training for taking care of the port at home. A home health care nurse may also take care of the port. °· Make sure to remember what type of port you have. °Incision care ° °  ° °· Follow instructions from your health care provider about how to take care of your port insertion site. Make sure you: °? Wash your hands with soap and water before and after you change your bandage (dressing). If soap and water are not available, use hand sanitizer. °? Change your dressing as told by your health care provider. °? Leave stitches (sutures), skin glue, or adhesive strips in place. These skin closures may need to stay in place for 2 weeks or longer. If adhesive strip edges start to loosen and curl up, you may trim the loose edges. Do not remove adhesive strips completely unless your health care provider tells you to do that. °· Check your port insertion site every day for signs of infection. Check for: °? Redness, swelling, or pain. °? Fluid or blood. °? Warmth. °? Pus or a bad smell. °Activity °· Return to your normal activities as told by your health care provider. Ask your health care provider what activities are safe for you. °· Do not  lift anything that is heavier than 10 lb (4.5 kg), or the limit that you are told, until your health care provider says that it is safe. °General instructions °· Take over-the-counter and prescription medicines only as told by your health care provider. °· Do not take baths, swim, or use a hot tub until your health care provider approves. Ask your health care provider if you may take showers. You may only be allowed to take sponge baths. °· Do not drive for 24 hours if you were given a sedative during your procedure. °· Wear a medical alert bracelet in case of an emergency. This will tell any health care providers that you have a port. °· Keep all follow-up visits as told by your health care provider. This is important. °Contact a health care provider if: °· You cannot flush your port with saline as directed, or you cannot draw blood from the port. °· You have a fever or chills. °· You have redness, swelling, or pain around your port insertion site. °· You have fluid or blood coming from your port insertion site. °· Your port insertion site feels warm to the touch. °· You have pus or a bad smell coming from the port insertion site. °Get help right away if: °· You have chest pain or shortness of breath. °· You have bleeding from your port that you cannot control. °Summary °· Take care of the port as told by your health   care provider. Keep the manufacturer's information card with you at all times. °· Change your dressing as told by your health care provider. °· Contact a health care provider if you have a fever or chills or if you have redness, swelling, or pain around your port insertion site. °· Keep all follow-up visits as told by your health care provider. °This information is not intended to replace advice given to you by your health care provider. Make sure you discuss any questions you have with your health care provider. °Document Revised: 02/06/2018 Document Reviewed: 02/06/2018 °Elsevier Patient Education ©  2020 [employer] ° °

## 2020-05-01 ENCOUNTER — Other Ambulatory Visit: Payer: Self-pay | Admitting: *Deleted

## 2020-05-01 DIAGNOSIS — C672 Malignant neoplasm of lateral wall of bladder: Secondary | ICD-10-CM

## 2020-05-01 DIAGNOSIS — I10 Essential (primary) hypertension: Secondary | ICD-10-CM

## 2020-05-01 MED ORDER — AMLODIPINE BESYLATE 10 MG PO TABS: 10.0000 mg | ORAL_TABLET | Freq: Every day | ORAL | 0 refills | Status: DC

## 2020-05-01 MED ORDER — OXYCODONE HCL 5 MG PO TABS
5.0000 mg | ORAL_TABLET | Freq: Four times a day (QID) | ORAL | 0 refills | Status: DC | PRN
Start: 2020-05-01 — End: 2020-06-01

## 2020-05-19 ENCOUNTER — Other Ambulatory Visit: Payer: Self-pay | Admitting: Family

## 2020-05-20 NOTE — Telephone Encounter (Signed)
lvm to schedule appt.  

## 2020-05-20 NOTE — Telephone Encounter (Signed)
Please contact pt to schedule follow up appointment.

## 2020-06-01 ENCOUNTER — Other Ambulatory Visit: Payer: Self-pay | Admitting: *Deleted

## 2020-06-01 MED ORDER — OXYCODONE HCL 5 MG PO TABS
5.0000 mg | ORAL_TABLET | Freq: Four times a day (QID) | ORAL | 0 refills | Status: DC | PRN
Start: 2020-06-01 — End: 2020-06-23

## 2020-06-08 ENCOUNTER — Telehealth: Payer: Self-pay

## 2020-06-08 NOTE — Telephone Encounter (Signed)
Pt called in to r/s her 11/16 appts as she is having to move and has r/s to 11/23 with Judson Roch.... AOM

## 2020-06-09 ENCOUNTER — Inpatient Hospital Stay: Payer: Medicare Other | Admitting: Hematology & Oncology

## 2020-06-09 ENCOUNTER — Inpatient Hospital Stay: Payer: Medicare Other

## 2020-06-15 ENCOUNTER — Other Ambulatory Visit: Payer: Self-pay | Admitting: *Deleted

## 2020-06-15 DIAGNOSIS — C672 Malignant neoplasm of lateral wall of bladder: Secondary | ICD-10-CM

## 2020-06-16 ENCOUNTER — Inpatient Hospital Stay: Payer: Medicare Other

## 2020-06-16 ENCOUNTER — Other Ambulatory Visit: Payer: Self-pay

## 2020-06-16 ENCOUNTER — Telehealth: Payer: Self-pay | Admitting: Hematology & Oncology

## 2020-06-16 ENCOUNTER — Inpatient Hospital Stay (HOSPITAL_BASED_OUTPATIENT_CLINIC_OR_DEPARTMENT_OTHER): Payer: Medicare Other | Admitting: Family

## 2020-06-16 ENCOUNTER — Encounter: Payer: Self-pay | Admitting: Family

## 2020-06-16 ENCOUNTER — Telehealth: Payer: Self-pay | Admitting: Family

## 2020-06-16 ENCOUNTER — Inpatient Hospital Stay: Payer: Medicare Other | Attending: Hematology & Oncology

## 2020-06-16 VITALS — BP 149/94 | HR 94 | Temp 98.1°F | Resp 18 | Ht 65.0 in | Wt 74.0 lb

## 2020-06-16 DIAGNOSIS — C671 Malignant neoplasm of dome of bladder: Secondary | ICD-10-CM

## 2020-06-16 DIAGNOSIS — F418 Other specified anxiety disorders: Secondary | ICD-10-CM | POA: Diagnosis not present

## 2020-06-16 DIAGNOSIS — E032 Hypothyroidism due to medicaments and other exogenous substances: Secondary | ICD-10-CM | POA: Diagnosis not present

## 2020-06-16 DIAGNOSIS — Z79899 Other long term (current) drug therapy: Secondary | ICD-10-CM | POA: Insufficient documentation

## 2020-06-16 DIAGNOSIS — D500 Iron deficiency anemia secondary to blood loss (chronic): Secondary | ICD-10-CM | POA: Insufficient documentation

## 2020-06-16 DIAGNOSIS — C672 Malignant neoplasm of lateral wall of bladder: Secondary | ICD-10-CM

## 2020-06-16 DIAGNOSIS — R911 Solitary pulmonary nodule: Secondary | ICD-10-CM | POA: Diagnosis not present

## 2020-06-16 DIAGNOSIS — Z5112 Encounter for antineoplastic immunotherapy: Secondary | ICD-10-CM | POA: Diagnosis not present

## 2020-06-16 LAB — COMPREHENSIVE METABOLIC PANEL
ALT: 10 U/L (ref 0–44)
AST: 13 U/L — ABNORMAL LOW (ref 15–41)
Albumin: 4.1 g/dL (ref 3.5–5.0)
Alkaline Phosphatase: 94 U/L (ref 38–126)
Anion gap: 5 (ref 5–15)
BUN: 10 mg/dL (ref 8–23)
CO2: 33 mmol/L — ABNORMAL HIGH (ref 22–32)
Calcium: 9.7 mg/dL (ref 8.9–10.3)
Chloride: 105 mmol/L (ref 98–111)
Creatinine, Ser: 0.88 mg/dL (ref 0.44–1.00)
GFR, Estimated: >60 mL/min (ref >60)
Glucose, Bld: 90 mg/dL (ref 70–99)
Potassium: 4 mmol/L (ref 3.5–5.1)
Sodium: 143 mmol/L (ref 135–145)
Total Bilirubin: 0.2 mg/dL — ABNORMAL LOW (ref 0.3–1.2)
Total Protein: 7.1 g/dL (ref 6.5–8.1)

## 2020-06-16 LAB — CBC WITH DIFFERENTIAL (CANCER CENTER ONLY)
Abs Immature Granulocytes: 0.01 10*3/uL (ref 0.00–0.07)
Basophils Absolute: 0 10*3/uL (ref 0.0–0.1)
Basophils Relative: 1 %
Eosinophils Absolute: 0.1 10*3/uL (ref 0.0–0.5)
Eosinophils Relative: 2 %
HCT: 40.3 % (ref 36.0–46.0)
Hemoglobin: 13.3 g/dL (ref 12.0–15.0)
Immature Granulocytes: 0 %
Lymphocytes Relative: 22 %
Lymphs Abs: 1 10*3/uL (ref 0.7–4.0)
MCH: 32.3 pg (ref 26.0–34.0)
MCHC: 33 g/dL (ref 30.0–36.0)
MCV: 97.8 fL (ref 80.0–100.0)
Monocytes Absolute: 0.5 10*3/uL (ref 0.1–1.0)
Monocytes Relative: 12 %
Neutro Abs: 2.7 10*3/uL (ref 1.7–7.7)
Neutrophils Relative %: 63 %
Platelet Count: 176 10*3/uL (ref 150–400)
RBC: 4.12 MIL/uL (ref 3.87–5.11)
RDW: 14.7 % (ref 11.5–15.5)
WBC Count: 4.3 10*3/uL (ref 4.0–10.5)
nRBC: 0 % (ref 0.0–0.2)

## 2020-06-16 LAB — TSH: TSH: 1.058 u[IU]/mL (ref 0.308–3.960)

## 2020-06-16 MED ORDER — DIAZEPAM 5 MG PO TABS: 5.0000 mg | ORAL_TABLET | Freq: Three times a day (TID) | ORAL | 0 refills | Status: DC | PRN

## 2020-06-16 MED ORDER — SODIUM CHLORIDE 0.9 % IV SOLN
400.0000 mg | Freq: Once | INTRAVENOUS | Status: AC
  Administered 2020-06-16: 400 mg via INTRAVENOUS
  Filled 2020-06-16: qty 16

## 2020-06-16 MED ORDER — HEPARIN SOD (PORK) LOCK FLUSH 100 UNIT/ML IV SOLN
500.0000 [IU] | Freq: Once | INTRAVENOUS | Status: AC | PRN
  Administered 2020-06-16: 500 [IU]
  Filled 2020-06-16: qty 5

## 2020-06-16 MED ORDER — SODIUM CHLORIDE 0.9% FLUSH
10.0000 mL | INTRAVENOUS | Status: DC | PRN
  Administered 2020-06-16: 10 mL
  Filled 2020-06-16: qty 10

## 2020-06-16 MED ORDER — SODIUM CHLORIDE 0.9 % IV SOLN
Freq: Once | INTRAVENOUS | Status: AC
  Filled 2020-06-16: qty 250

## 2020-06-16 NOTE — Telephone Encounter (Signed)
Amitriptyline(ELAVIL) 10 MG tablet.   Patient came in and asked for refill  Pharmacy on [facility])

## 2020-06-16 NOTE — Progress Notes (Signed)
Hematology and Oncology Follow Up Visit  Donna Curry [id number] September 19, 1955 64 y.o. 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 She is doing well and has no complaints at this time.  She still has mild SOB with exertion. She states that she is down to smoking only 3 cigarettes a day with the hopes of eventually quitting all together.  No fever, chills, n/v, cough, rash, dizziness, chest pain, palpitations, abdominal pain or changes in bowel or bladder habits.  No swelling, tenderness, numbness or tingling in her extremities.  She has chronic pain in her back and left shoulder due to arthritis. This waxes and wanes.  No falls or syncopal episodes to report.  No blood loss noted . No abnormal bruising or petechiae.  She states that she is eating well and taking her Donna Curry as directed. She feels that she is properly hydrated and her weight is stable at 74 lbs.   ECOG Performance Status: 1 - Symptomatic but completely ambulatory  Medications:  Allergies as of 06/16/2020   No Known Allergies     Medication List       Accurate as of June 16, 2020  9:21 AM. If you have any questions, ask your nurse or doctor.        acetaminophen 500 MG tablet Commonly known as: TYLENOL Take 1,000 mg by mouth every 8 (eight) hours as needed for moderate pain.   albuterol 108 (90 Base) MCG/ACT inhaler Commonly known as: VENTOLIN HFA Inhale 2 puffs into the lungs every 6 (six) hours as needed for wheezing or shortness of breath.   albuterol (2.5 MG/3ML) 0.083% nebulizer solution Commonly  known as: PROVENTIL Take 3 mLs (2.5 mg total) by nebulization every 6 (six) hours as needed for wheezing or shortness of breath.   amitriptyline 10 MG tablet Commonly known as: ELAVIL Take 1 tablet (10 mg total) by mouth at bedtime as needed for sleep.   amLODipine 10 MG tablet Commonly known as: NORVASC Take 1 tablet (10 mg total) by mouth daily.   aspirin EC 81 MG tablet Take 81 mg by mouth daily.   citalopram 10 MG tablet Commonly known as: CeleXA Take 1 tablet (10 mg total) by mouth daily.   diazepam 5 MG tablet Commonly known as: VALIUM Take 1 tablet (5 mg total) by mouth every 8 (eight) hours as needed for anxiety.   dronabinol 5 MG capsule Commonly known as: Donna Curry Take 1 capsule (5 mg total) by mouth 2 (two) times daily before lunch and supper.   lidocaine-prilocaine cream Commonly known as: EMLA Apply to affected area once   megestrol 20 MG tablet Commonly known as: MEGACE Take 1 tablet (20 mg total) by mouth daily.   ondansetron 4 MG disintegrating tablet Commonly known as: Zofran ODT Take 1 tablet (4 mg total) by mouth every 8 (eight) hours as needed for nausea or vomiting.   ondansetron 8 MG tablet Commonly known as: ZOFRAN Take 1 tablet (8 mg total) by mouth every 8 (eight) hours as needed for nausea or vomiting.   oxyCODONE 5 MG immediate release tablet Commonly known as: Oxy IR/ROXICODONE Take 1 tablet (5 mg  total) by mouth every 6 (six) hours as needed for severe pain.   prochlorperazine 10 MG tablet Commonly known as: COMPAZINE Take 1 tablet (10 mg total) by mouth every 6 (six) hours as needed for nausea or vomiting.   Trelegy Ellipta 100-62.5-25 MCG/INH Aepb Generic drug: Fluticasone-Umeclidin-Vilant INHALE 1 PUFF TWICE DAILY   Vitamin D (Ergocalciferol) 1.25 MG (50000 UNIT) Caps capsule Commonly known as: DRISDOL Take 1 capsule by mouth once a week       Allergies: No Known Allergies  Past Medical History, Surgical history, Social  history, and Family History were reviewed and updated.  Review of Systems: All other 10 point review of systems is negative.   Physical Exam:  height is 5\' 5"  (1.651 m) and weight is 74 lb 0.6 oz (33.6 kg). Her oral temperature is 98.1 F (36.7 C). Her blood pressure is 149/94 (abnormal) and her pulse is 94. Her respiration is 18 and oxygen saturation is 99%.   Wt Readings from Last 3 Encounters:  06/16/20 74 lb 0.6 oz (33.6 kg)  04/13/20 73 lb (33.1 kg)  03/17/20 75 lb (34 kg)    Ocular: Sclerae unicteric, pupils equal, round and reactive to light Ear-nose-throat: Oropharynx clear, dentition fair Lymphatic: No cervical or supraclavicular adenopathy Lungs no rales or rhonchi, good excursion bilaterally Heart regular rate and rhythm, no murmur appreciated Abd soft, nontender, positive bowel sounds MSK no focal spinal tenderness, no joint edema Neuro: non-focal, well-oriented, appropriate affect Breasts: Deferred   Lab Results  Component Value Date   WBC 4.3 06/16/2020   HGB 13.3 06/16/2020   HCT 40.3 06/16/2020   MCV 97.8 06/16/2020   PLT 176 06/16/2020   Lab Results  Component Value Date   FERRITIN 10 (L) 12/26/2018   IRON 34 (L) 12/26/2018   TIBC 353 12/26/2018   UIBC 319 12/26/2018   IRONPCTSAT 10 (L) 12/26/2018   Lab Results  Component Value Date   RBC 4.12 06/16/2020   No results found for: KPAFRELGTCHN, LAMBDASER, KAPLAMBRATIO No results found for: IGGSERUM, IGA, IGMSERUM No results found for: [name], SPEI   Chemistry      Component Value Date/Time   NA 143 06/16/2020 0854   K 4.0 06/16/2020 0854   CL 105 06/16/2020 0854   CO2 33 (H) 06/16/2020 0854   BUN 10 06/16/2020 0854   CREATININE 0.88 06/16/2020 0854   CREATININE 0.85 04/28/2020 1235   CREATININE 1.10 08/20/2013 1027      Component Value Date/Time   CALCIUM 9.7 06/16/2020 0854   ALKPHOS 94 06/16/2020 0854   AST 13 (L) 06/16/2020 0854    AST 12 (L) 04/28/2020 1235   ALT 10 06/16/2020 0854   ALT 8 04/28/2020 1235   BILITOT 0.2 (L) 06/16/2020 0854   BILITOT 0.3 04/28/2020 1235       Impression and Plan: Donna Curry is a very pleasant 64 yo African American female with 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  She continues to tolerate immunotherapy well and has had a nice response so far.  We will proceed with treatment today as planned.  We will repeat A CT of the chest in 6 weeks same day as MD follow-up and treatment.  She is in agreement with the plan and will contact our office with any questions or concerns.  Valium prescription refilled per patient request.   Laverna Peace, NP 11/23/20219:21 AM

## 2020-06-16 NOTE — Telephone Encounter (Signed)
Lvm with sister for patient to call about medication request.

## 2020-06-16 NOTE — Telephone Encounter (Signed)
Appointments scheduled calendar printed & mailed per 11/23 los

## 2020-06-16 NOTE — Patient Instructions (Signed)

## 2020-06-16 NOTE — Patient Instructions (Signed)
Pt discharged in no apparent distress. Pt left ambulatory without assistance. Pt aware of discharge instructions and verbalized understanding and had no further questions.  

## 2020-06-16 NOTE — Addendum Note (Signed)
Addended by: Burney Gauze R on: 06/16/2020 10:07 AM   Modules accepted: Orders

## 2020-06-16 NOTE — Telephone Encounter (Signed)
Appointments scheduled calendar printed per 11/23 los

## 2020-06-17 NOTE — Telephone Encounter (Addendum)
Tried calling again to speak with patient. Left voice message. Prescription has not been filled since 05/28/19 with one refill. Need more information as to why patient needs refill-if she is taking or not.

## 2020-06-23 ENCOUNTER — Other Ambulatory Visit: Payer: Self-pay | Admitting: *Deleted

## 2020-06-23 MED ORDER — OXYCODONE HCL 5 MG PO TABS: 5.0000 mg | ORAL_TABLET | Freq: Four times a day (QID) | ORAL | 0 refills | Status: DC | PRN

## 2020-07-23 ENCOUNTER — Other Ambulatory Visit: Payer: Self-pay | Admitting: *Deleted

## 2020-07-23 DIAGNOSIS — C671 Malignant neoplasm of dome of bladder: Secondary | ICD-10-CM

## 2020-07-23 DIAGNOSIS — F418 Other specified anxiety disorders: Secondary | ICD-10-CM

## 2020-07-23 MED ORDER — OXYCODONE HCL 5 MG PO TABS: 5.0000 mg | ORAL_TABLET | Freq: Four times a day (QID) | ORAL | 0 refills | Status: DC | PRN

## 2020-07-23 MED ORDER — DIAZEPAM 5 MG PO TABS: 5.0000 mg | ORAL_TABLET | Freq: Three times a day (TID) | ORAL | 0 refills | Status: DC | PRN

## 2020-07-28 ENCOUNTER — Other Ambulatory Visit: Payer: Medicare Other

## 2020-07-28 ENCOUNTER — Telehealth: Payer: Self-pay

## 2020-07-28 ENCOUNTER — Inpatient Hospital Stay: Payer: Medicare Other

## 2020-07-28 ENCOUNTER — Telehealth: Payer: Self-pay | Admitting: Family

## 2020-07-28 ENCOUNTER — Inpatient Hospital Stay: Payer: Medicare Other | Admitting: Hematology & Oncology

## 2020-07-28 NOTE — Telephone Encounter (Signed)
S/w pts sister regarding ct chest that needs to be done prior to visit, called and left a vm for [facility] to have appt 08/05/20 at 11:00

## 2020-07-28 NOTE — Telephone Encounter (Signed)
Daughter called in regarding missed 07/28/20 appts as she has a bad cough, appts have been r/s to 08/05/20 and she is aware of the staggered appts times to get her in, she also mentioned albuterol for breathing tx an and her call was transferred to the nurse..... AOM

## 2020-07-28 NOTE — Telephone Encounter (Signed)
Medication:TRELEGY ELLIPTA 100-62.5-25 MCG/INH AEPB [[id number]]      Has the patient contacted their pharmacy? NO (If no, request that the patient contact the pharmacy for the refill.) (If yes, when and what did the pharmacy advise?)    Preferred Pharmacy (with phone number or street name):[place], [place] [address]  Phone:  [phone] Fax:  [phone]     Agent: Please be advised that RX refills may take up to 3 business days. We ask that you follow-up with your pharmacy.

## 2020-07-29 ENCOUNTER — Telehealth: Payer: Self-pay | Admitting: Family

## 2020-07-29 MED ORDER — TRELEGY ELLIPTA 100-62.5-25 MCG/INH IN AEPB: 1.0000 | INHALATION_SPRAY | Freq: Two times a day (BID) | RESPIRATORY_TRACT | 0 refills | Status: DC

## 2020-07-29 NOTE — Telephone Encounter (Signed)
Rx sent 

## 2020-08-05 ENCOUNTER — Telehealth: Payer: Self-pay

## 2020-08-05 ENCOUNTER — Ambulatory Visit: Payer: Medicare Other

## 2020-08-05 ENCOUNTER — Ambulatory Visit: Payer: Medicare Other | Admitting: Family

## 2020-08-05 ENCOUNTER — Ambulatory Visit (HOSPITAL_BASED_OUTPATIENT_CLINIC_OR_DEPARTMENT_OTHER): Admission: RE | Admit: 2020-08-05 | Payer: Medicare Other | Source: Ambulatory Visit

## 2020-08-05 ENCOUNTER — Other Ambulatory Visit: Payer: Medicare Other

## 2020-08-05 NOTE — Telephone Encounter (Signed)
Pt and daughter missed todays ct scan appt and were late for appts here, r/s per sarah.  Per last phone note the pts sister was aware of scan and other appts but failed to give the appts to pts daughter.  A new calendar has been printed for the daughter.....Donna Curry

## 2020-08-11 ENCOUNTER — Inpatient Hospital Stay: Payer: Medicare Other

## 2020-08-11 ENCOUNTER — Ambulatory Visit (HOSPITAL_BASED_OUTPATIENT_CLINIC_OR_DEPARTMENT_OTHER): Admission: RE | Admit: 2020-08-11 | Payer: Medicare Other | Source: Ambulatory Visit

## 2020-08-11 ENCOUNTER — Telehealth: Payer: Self-pay

## 2020-08-11 ENCOUNTER — Inpatient Hospital Stay: Payer: Medicare Other | Admitting: Family

## 2020-08-11 NOTE — Telephone Encounter (Signed)
Left a vm with new appts due to weather/no show 1-18, also left a vm on phone for [place]....Marland Kitchenaom

## 2020-08-18 ENCOUNTER — Ambulatory Visit (HOSPITAL_BASED_OUTPATIENT_CLINIC_OR_DEPARTMENT_OTHER)
Admission: RE | Admit: 2020-08-18 | Discharge: 2020-08-18 | Disposition: A | Discharge status: Home / Self Care | Payer: Medicare Other | Source: Ambulatory Visit | Attending: Family | Admitting: Family

## 2020-08-18 ENCOUNTER — Inpatient Hospital Stay: Payer: Medicare Other

## 2020-08-18 ENCOUNTER — Inpatient Hospital Stay: Payer: Medicare Other | Attending: Hematology & Oncology

## 2020-08-18 ENCOUNTER — Inpatient Hospital Stay (HOSPITAL_BASED_OUTPATIENT_CLINIC_OR_DEPARTMENT_OTHER): Payer: Medicare Other | Admitting: Family

## 2020-08-18 ENCOUNTER — Encounter: Payer: Self-pay | Admitting: Family

## 2020-08-18 ENCOUNTER — Telehealth: Payer: Self-pay

## 2020-08-18 ENCOUNTER — Other Ambulatory Visit: Payer: Self-pay

## 2020-08-18 VITALS — BP 138/78 | HR 93 | Temp 98.6°F | Resp 17 | Ht 62.0 in | Wt 75.1 lb

## 2020-08-18 DIAGNOSIS — C671 Malignant neoplasm of dome of bladder: Secondary | ICD-10-CM

## 2020-08-18 DIAGNOSIS — C672 Malignant neoplasm of lateral wall of bladder: Secondary | ICD-10-CM | POA: Insufficient documentation

## 2020-08-18 DIAGNOSIS — R911 Solitary pulmonary nodule: Secondary | ICD-10-CM

## 2020-08-18 DIAGNOSIS — Z5112 Encounter for antineoplastic immunotherapy: Secondary | ICD-10-CM | POA: Insufficient documentation

## 2020-08-18 DIAGNOSIS — J984 Other disorders of lung: Secondary | ICD-10-CM | POA: Diagnosis not present

## 2020-08-18 DIAGNOSIS — E032 Hypothyroidism due to medicaments and other exogenous substances: Secondary | ICD-10-CM

## 2020-08-18 DIAGNOSIS — C779 Secondary and unspecified malignant neoplasm of lymph node, unspecified: Secondary | ICD-10-CM | POA: Insufficient documentation

## 2020-08-18 DIAGNOSIS — I10 Essential (primary) hypertension: Secondary | ICD-10-CM

## 2020-08-18 DIAGNOSIS — J432 Centrilobular emphysema: Secondary | ICD-10-CM | POA: Diagnosis not present

## 2020-08-18 DIAGNOSIS — C3490 Malignant neoplasm of unspecified part of unspecified bronchus or lung: Secondary | ICD-10-CM | POA: Diagnosis not present

## 2020-08-18 DIAGNOSIS — Z79899 Other long term (current) drug therapy: Secondary | ICD-10-CM | POA: Diagnosis not present

## 2020-08-18 DIAGNOSIS — F418 Other specified anxiety disorders: Secondary | ICD-10-CM

## 2020-08-18 LAB — CMP (CANCER CENTER ONLY)
ALT: 18 U/L (ref 0–44)
AST: 21 U/L (ref 15–41)
Albumin: 3.5 g/dL (ref 3.5–5.0)
Alkaline Phosphatase: 86 U/L (ref 38–126)
Anion gap: 9 (ref 5–15)
BUN: 10 mg/dL (ref 8–23)
CO2: 29 mmol/L (ref 22–32)
Calcium: 8.8 mg/dL — ABNORMAL LOW (ref 8.9–10.3)
Chloride: 100 mmol/L (ref 98–111)
Creatinine: 0.68 mg/dL (ref 0.44–1.00)
GFR, Estimated: >60 mL/min (ref >60)
Glucose, Bld: 75 mg/dL (ref 70–99)
Potassium: 4.1 mmol/L (ref 3.5–5.1)
Sodium: 138 mmol/L (ref 135–145)
Total Bilirubin: 0.3 mg/dL (ref 0.3–1.2)
Total Protein: 6.9 g/dL (ref 6.5–8.1)

## 2020-08-18 LAB — CBC WITH DIFFERENTIAL (CANCER CENTER ONLY)
Abs Immature Granulocytes: 0.01 10*3/uL (ref 0.00–0.07)
Basophils Absolute: 0 10*3/uL (ref 0.0–0.1)
Basophils Relative: 0 %
Eosinophils Absolute: 0.1 10*3/uL (ref 0.0–0.5)
Eosinophils Relative: 1 %
HCT: 34.6 % — ABNORMAL LOW (ref 36.0–46.0)
Hemoglobin: 11.6 g/dL — ABNORMAL LOW (ref 12.0–15.0)
Immature Granulocytes: 0 %
Lymphocytes Relative: 25 %
Lymphs Abs: 1 10*3/uL (ref 0.7–4.0)
MCH: 32 pg (ref 26.0–34.0)
MCHC: 33.5 g/dL (ref 30.0–36.0)
MCV: 95.3 fL (ref 80.0–100.0)
Monocytes Absolute: 0.4 10*3/uL (ref 0.1–1.0)
Monocytes Relative: 11 %
Neutro Abs: 2.5 10*3/uL (ref 1.7–7.7)
Neutrophils Relative %: 63 %
Platelet Count: 207 10*3/uL (ref 150–400)
RBC: 3.63 MIL/uL — ABNORMAL LOW (ref 3.87–5.11)
RDW: 14.6 % (ref 11.5–15.5)
WBC Count: 3.9 10*3/uL — ABNORMAL LOW (ref 4.0–10.5)
nRBC: 0 % (ref 0.0–0.2)

## 2020-08-18 LAB — TSH: TSH: 1.074 u[IU]/mL (ref 0.308–3.960)

## 2020-08-18 MED ORDER — OXYCODONE HCL 5 MG PO TABS: 5.0000 mg | ORAL_TABLET | Freq: Four times a day (QID) | ORAL | 0 refills | Status: DC | PRN

## 2020-08-18 MED ORDER — SODIUM CHLORIDE 0.9 % IV SOLN
400.0000 mg | Freq: Once | INTRAVENOUS | Status: AC
  Administered 2020-08-18: 400 mg via INTRAVENOUS
  Filled 2020-08-18: qty 16

## 2020-08-18 MED ORDER — SODIUM CHLORIDE 0.9 % IV SOLN
Freq: Once | INTRAVENOUS | Status: AC
  Filled 2020-08-18: qty 250

## 2020-08-18 MED ORDER — AMLODIPINE BESYLATE 10 MG PO TABS: 10.0000 mg | ORAL_TABLET | Freq: Every day | ORAL | 0 refills | Status: DC

## 2020-08-18 MED ORDER — HEPARIN SOD (PORK) LOCK FLUSH 100 UNIT/ML IV SOLN
500.0000 [IU] | Freq: Once | INTRAVENOUS | Status: AC | PRN
  Administered 2020-08-18: 500 [IU]
  Filled 2020-08-18: qty 5

## 2020-08-18 MED ORDER — SODIUM CHLORIDE 0.9% FLUSH
10.0000 mL | INTRAVENOUS | Status: DC | PRN
  Administered 2020-08-18: 10 mL
  Filled 2020-08-18: qty 10

## 2020-08-18 NOTE — Progress Notes (Signed)
Hematology and Oncology Follow Up Visit  Donna Curry [id number] November 10, 1955 65 y.o. 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 She states that she is doing well and has no complaints at this time.  She has some mild SOB and dry cough at times. She is still smoking 5 cigarettes a day.  She denies fatigue No fever, chills, n/v, rash, dizziness, chest pain, palpitations, abdominal pain or changes in bowel or bladder habits.  No blood loss, bruising or petechiae.  No swelling, tenderness, numbness or tingling in her extremities at this time.  Pedal pulses are 2+.  No falls or syncope.  She states that she is taking her Marinol daily as prescribed and has a good appetite. She feels that she is hydrating properly throughout the day.   ECOG Performance Status: 1 - Symptomatic but completely ambulatory  Medications:  Allergies as of 08/18/2020   No Known Allergies     Medication List       Accurate as of August 18, 2020 10:15 AM. If you have any questions, ask your nurse or doctor.        acetaminophen 500 MG tablet Commonly known as: TYLENOL Take 1,000 mg by mouth every 8 (eight) hours as needed for moderate pain.   albuterol (2.5 MG/3ML) 0.083% nebulizer solution Commonly known as: PROVENTIL Take 3 mLs (2.5 mg total) by nebulization every 6 (six) hours as needed for wheezing or shortness of breath.   amitriptyline 10 MG tablet Commonly known as: ELAVIL Take 1 tablet (10 mg total) by mouth at bedtime as needed for sleep.   amLODipine 10 MG tablet Commonly known as: NORVASC Take 1  tablet (10 mg total) by mouth daily.   aspirin EC 81 MG tablet Take 81 mg by mouth daily.   citalopram 10 MG tablet Commonly known as: CeleXA Take 1 tablet (10 mg total) by mouth daily.   diazepam 5 MG tablet Commonly known as: VALIUM Take 1 tablet (5 mg total) by mouth every 8 (eight) hours as needed for anxiety.   dronabinol 5 MG capsule Commonly known as: MARINOL Take 1 capsule (5 mg total) by mouth 2 (two) times daily before lunch and supper.   lidocaine-prilocaine cream Commonly known as: EMLA Apply to affected area once   megestrol 20 MG tablet Commonly known as: MEGACE Take 1 tablet (20 mg total) by mouth daily.   ondansetron 4 MG disintegrating tablet Commonly known as: Zofran ODT Take 1 tablet (4 mg total) by mouth every 8 (eight) hours as needed for nausea or vomiting.   ondansetron 8 MG tablet Commonly known as: ZOFRAN Take 1 tablet (8 mg total) by mouth every 8 (eight) hours as needed for nausea or vomiting.   oxyCODONE 5 MG immediate release tablet Commonly known as: Oxy IR/ROXICODONE Take 1 tablet (5 mg total) by mouth every 6 (six) hours as needed for severe pain.   prochlorperazine 10 MG tablet Commonly known as: COMPAZINE Take 1 tablet (10 mg total) by mouth every 6 (six) hours as needed for nausea or vomiting.   Trelegy Ellipta 100-62.5-25 MCG/INH Aepb Generic drug: Fluticasone-Umeclidin-Vilant Inhale 1 puff into the lungs 2 (two) times  daily.   Vitamin D (Ergocalciferol) 1.25 MG (50000 UNIT) Caps capsule Commonly known as: DRISDOL Take 1 capsule by mouth once a week       Allergies: No Known Allergies  Past Medical History, Surgical history, Social history, and Family History were reviewed and updated.  Review of Systems: All other 10 point review of systems is negative.   Physical Exam:  height is 5\' 2"  (1.575 m) and weight is 75 lb 1.6 oz (34.1 kg). Her oral temperature is 98.6 F (37 C). Her blood pressure is 138/78 and her pulse is  93. Her respiration is 17 and oxygen saturation is 98%.   Wt Readings from Last 3 Encounters:  08/18/20 75 lb 1.6 oz (34.1 kg)  06/16/20 74 lb 0.6 oz (33.6 kg)  04/13/20 73 lb (33.1 kg)    Ocular: Sclerae unicteric, pupils equal, round and reactive to light Ear-nose-throat: Oropharynx clear, dentition fair Lymphatic: No cervical or supraclavicular adenopathy Lungs no rales or rhonchi, good excursion bilaterally Heart regular rate and rhythm, no murmur appreciated Abd soft, nontender, positive bowel sounds MSK no focal spinal tenderness, no joint edema Neuro: non-focal, well-oriented, appropriate affect Breasts: Deferred   Lab Results  Component Value Date   WBC 3.9 (L) 08/18/2020   HGB 11.6 (L) 08/18/2020   HCT 34.6 (L) 08/18/2020   MCV 95.3 08/18/2020   PLT 207 08/18/2020   Lab Results  Component Value Date   FERRITIN 10 (L) 12/26/2018   IRON 34 (L) 12/26/2018   TIBC 353 12/26/2018   UIBC 319 12/26/2018   IRONPCTSAT 10 (L) 12/26/2018   Lab Results  Component Value Date   RBC 3.63 (L) 08/18/2020   No results found for: KPAFRELGTCHN, LAMBDASER, KAPLAMBRATIO No results found for: IGGSERUM, IGA, IGMSERUM No results found for: [name], SPEI   Chemistry      Component Value Date/Time   NA 143 06/16/2020 0854   K 4.0 06/16/2020 0854   CL 105 06/16/2020 0854   CO2 33 (H) 06/16/2020 0854   BUN 10 06/16/2020 0854   CREATININE 0.88 06/16/2020 0854   CREATININE 0.85 04/28/2020 1235   CREATININE 1.10 08/20/2013 1027      Component Value Date/Time   CALCIUM 9.7 06/16/2020 0854   ALKPHOS 94 06/16/2020 0854   AST 13 (L) 06/16/2020 0854   AST 12 (L) 04/28/2020 1235   ALT 10 06/16/2020 0854   ALT 8 04/28/2020 1235   BILITOT 0.2 (L) 06/16/2020 0854   BILITOT 0.3 04/28/2020 1235       Impression and Plan: Donna Curry is a very pleasant 65 yo African American female with locally advanced/metastatic high-grade  urothelial carcinoma of the bladder.  CT scan today showed resolution of the nodularity associated with linear scarring in the lateral aspect of the left upper lobe.  TSH is pending.  She has done well with immunotherapy and has no complaints at this time.  We will proceed with treatment to day as planned.  Follow-up in 6 weeks.  Norvasc and Oxycodone refilled.  She was encouraged to contact our office with any questions or concerns.   Laverna Peace, NP 1/25/202210:15 AM

## 2020-08-18 NOTE — Telephone Encounter (Signed)
09/29/20 appts made and printed per 08/18/20 los, 09/08/20 appts cx as too soon- old appts     Donna Curry

## 2020-08-18 NOTE — Patient Instructions (Signed)
[facility] Discharge Instructions for Patients Receiving Chemotherapy  Today you received the following chemotherapy agents :  Keytruda.  To help prevent nausea and vomiting after your treatment, we encourage you to take your nausea medication as prescribed.   If you develop nausea and vomiting that is not controlled by your nausea medication, call the clinic.   BELOW ARE SYMPTOMS THAT SHOULD BE REPORTED IMMEDIATELY:  *FEVER GREATER THAN 100.5 F  *CHILLS WITH OR WITHOUT FEVER  NAUSEA AND VOMITING THAT IS NOT CONTROLLED WITH YOUR NAUSEA MEDICATION  *UNUSUAL SHORTNESS OF BREATH  *UNUSUAL BRUISING OR BLEEDING  TENDERNESS IN MOUTH AND THROAT WITH OR WITHOUT PRESENCE OF ULCERS  *URINARY PROBLEMS  *BOWEL PROBLEMS  UNUSUAL RASH Items with * indicate a potential emergency and should be followed up as soon as possible.  Feel free to call the clinic should you have any questions or concerns. The clinic phone number is [phone].  Please show the CHEMO ALERT CARD at check-in to the Emergency Department and triage nurse.  

## 2020-08-18 NOTE — Patient Instructions (Signed)

## 2020-09-03 ENCOUNTER — Other Ambulatory Visit: Payer: Self-pay | Admitting: *Deleted

## 2020-09-03 DIAGNOSIS — F418 Other specified anxiety disorders: Secondary | ICD-10-CM

## 2020-09-03 DIAGNOSIS — R4589 Other symptoms and signs involving emotional state: Secondary | ICD-10-CM

## 2020-09-03 DIAGNOSIS — C671 Malignant neoplasm of dome of bladder: Secondary | ICD-10-CM

## 2020-09-03 MED ORDER — DIAZEPAM 5 MG PO TABS: 5.0000 mg | ORAL_TABLET | Freq: Three times a day (TID) | ORAL | 0 refills | Status: DC | PRN

## 2020-09-05 ENCOUNTER — Other Ambulatory Visit: Payer: Self-pay | Admitting: Family

## 2020-09-08 ENCOUNTER — Ambulatory Visit: Payer: Medicare Other

## 2020-09-08 ENCOUNTER — Telehealth: Payer: Self-pay | Admitting: *Deleted

## 2020-09-08 ENCOUNTER — Other Ambulatory Visit: Payer: Medicare Other

## 2020-09-08 ENCOUNTER — Ambulatory Visit: Payer: Medicare Other | Admitting: Family

## 2020-09-08 MED ORDER — ALBUTEROL SULFATE (2.5 MG/3ML) 0.083% IN NEBU: 2.5000 mg | INHALATION_SOLUTION | Freq: Four times a day (QID) | RESPIRATORY_TRACT | 3 refills | Status: DC | PRN

## 2020-09-08 NOTE — Telephone Encounter (Signed)
[facility] faxed over refill request for albuterol solution.  It was written by ER.

## 2020-09-14 ENCOUNTER — Ambulatory Visit (HOSPITAL_BASED_OUTPATIENT_CLINIC_OR_DEPARTMENT_OTHER)
Admission: RE | Admit: 2020-09-14 | Discharge: 2020-09-14 | Disposition: A | Discharge status: Home / Self Care | Payer: Medicare Other | Source: Ambulatory Visit | Attending: Family | Admitting: Family

## 2020-09-14 ENCOUNTER — Telehealth (INDEPENDENT_AMBULATORY_CARE_PROVIDER_SITE_OTHER): Payer: Medicare Other | Admitting: Family

## 2020-09-14 ENCOUNTER — Other Ambulatory Visit: Payer: Self-pay

## 2020-09-14 ENCOUNTER — Other Ambulatory Visit: Payer: Self-pay | Admitting: Family

## 2020-09-14 DIAGNOSIS — J439 Emphysema, unspecified: Secondary | ICD-10-CM | POA: Diagnosis not present

## 2020-09-14 DIAGNOSIS — R059 Cough, unspecified: Secondary | ICD-10-CM | POA: Diagnosis not present

## 2020-09-14 MED ORDER — BENZONATATE 100 MG PO CAPS: 100.0000 mg | ORAL_CAPSULE | Freq: Three times a day (TID) | ORAL | 0 refills | Status: DC | PRN

## 2020-09-14 MED ORDER — AZITHROMYCIN 250 MG PO TABS: ORAL_TABLET | ORAL | 0 refills | Status: DC

## 2020-09-14 MED FILL — AZITHROMYCIN 250 MG TABLET: 250 | 5 days supply | Qty: 6 | Fill #0

## 2020-09-14 MED FILL — BENZONATATE 100 MG CAPS: 100 | 6 days supply | Qty: 20 | Fill #0

## 2020-09-14 NOTE — Progress Notes (Signed)
Virtual Visit via Telephone Note  I connected with Donna Curry on 09/14/20 at  9:40 AM EST by telephone and verified that I am speaking with the correct person using two identifiers.  Location: Patient: home Provider: office   I discussed the limitations, risks, security and privacy concerns of performing an evaluation and management service by telephone and the availability of in person appointments. I also discussed with the patient that there may be a patient responsible charge related to this service. The patient expressed understanding and agreed to proceed.   History of Present Illness:  Patient is a 65 yr old female who presents today with chief complaint of cough.   Reports cough began 2 weeks ago. It is keeping her up at night. Denies fever or shortness of breath. She has not been tested recently for covid.  Cough is "wet."  She is using coricidin without improvement.   Observations/Objective:   Gen: Awake, alert, no acute distress Resp: Breathing sounds even and non-labored ENT: mild voice hoarseness is noted.  Psych: calm/pleasant demeanor Neuro: Alert and Oriented x 3, speech is clear.  Assessment and Plan:  Cough- will rx with azithromycin 500mg  today then 250mg  once daily for 4 more days as well as tessalon 100mg  three times daily as needed.  I have advised her to come to the [facility] to complete a chest x-ray as well. She is advised to call if symptoms worsen or if symptoms are not improved in 3-4 days.   Follow Up Instructions:    I discussed the assessment and treatment plan with the patient. The patient was provided an opportunity to ask questions and all were answered. The patient agreed with the plan and demonstrated an understanding of the instructions.   The patient was advised to call back or seek an in-person evaluation if the symptoms worsen or if the condition fails to improve as anticipated.  I provided 11 minutes of non-face-to-face time  during this encounter.   Nance Pear, NP

## 2020-09-15 ENCOUNTER — Telehealth: Payer: Self-pay | Admitting: Family

## 2020-09-15 MED ORDER — AMOXICILLIN-POT CLAVULANATE 875-125 MG PO TABS: 1.0000 | ORAL_TABLET | Freq: Two times a day (BID) | ORAL | 0 refills | Status: DC

## 2020-09-15 NOTE — Telephone Encounter (Signed)
Could you also ask her if she has had covid vaccination?

## 2020-09-15 NOTE — Telephone Encounter (Signed)
Please advise pt that I reviewed her CXR and it shows that she has pneumonia.  I would like her to continue the zpak that I gave her yesterday.  I would also like for her to add augmentin twice daily.  Rx sent to [facility]. Let's bring her into the office in 1 week for a follow up visit.

## 2020-09-15 NOTE — Telephone Encounter (Signed)
Unable to reach patient, information given to her sister.   Patient's sister reports that patient has not had any covid vaccinations

## 2020-09-16 NOTE — Telephone Encounter (Signed)
Noted. Will discuss with the patient at her upcoming appointment.

## 2020-09-22 ENCOUNTER — Other Ambulatory Visit: Payer: Self-pay

## 2020-09-22 ENCOUNTER — Emergency Department (HOSPITAL_BASED_OUTPATIENT_CLINIC_OR_DEPARTMENT_OTHER): Payer: Medicare Other

## 2020-09-22 ENCOUNTER — Encounter (HOSPITAL_BASED_OUTPATIENT_CLINIC_OR_DEPARTMENT_OTHER): Payer: Self-pay | Admitting: Emergency Medicine

## 2020-09-22 ENCOUNTER — Inpatient Hospital Stay (HOSPITAL_BASED_OUTPATIENT_CLINIC_OR_DEPARTMENT_OTHER)
Admission: EM | Admit: 2020-09-22 | Discharge: 2020-09-24 | DRG: 193 | Disposition: A | Discharge status: Home / Self Care | Payer: Medicare Other | Attending: Internal Medicine | Admitting: Internal Medicine

## 2020-09-22 ENCOUNTER — Ambulatory Visit (INDEPENDENT_AMBULATORY_CARE_PROVIDER_SITE_OTHER): Payer: Medicare Other | Admitting: Family

## 2020-09-22 VITALS — BP 142/86 | HR 110 | Temp 98.8°F | Resp 16 | Ht 62.0 in | Wt 72.4 lb

## 2020-09-22 DIAGNOSIS — Z7951 Long term (current) use of inhaled steroids: Secondary | ICD-10-CM

## 2020-09-22 DIAGNOSIS — Z79899 Other long term (current) drug therapy: Secondary | ICD-10-CM

## 2020-09-22 DIAGNOSIS — R64 Cachexia: Secondary | ICD-10-CM | POA: Diagnosis present

## 2020-09-22 DIAGNOSIS — J189 Pneumonia, unspecified organism: Secondary | ICD-10-CM

## 2020-09-22 DIAGNOSIS — R0902 Hypoxemia: Secondary | ICD-10-CM | POA: Diagnosis not present

## 2020-09-22 DIAGNOSIS — J439 Emphysema, unspecified: Secondary | ICD-10-CM | POA: Diagnosis not present

## 2020-09-22 DIAGNOSIS — I119 Hypertensive heart disease without heart failure: Secondary | ICD-10-CM | POA: Diagnosis not present

## 2020-09-22 DIAGNOSIS — C671 Malignant neoplasm of dome of bladder: Secondary | ICD-10-CM | POA: Diagnosis not present

## 2020-09-22 DIAGNOSIS — E43 Unspecified severe protein-calorie malnutrition: Secondary | ICD-10-CM | POA: Diagnosis not present

## 2020-09-22 DIAGNOSIS — I43 Cardiomyopathy in diseases classified elsewhere: Secondary | ICD-10-CM | POA: Diagnosis not present

## 2020-09-22 DIAGNOSIS — Z8551 Personal history of malignant neoplasm of bladder: Secondary | ICD-10-CM | POA: Diagnosis not present

## 2020-09-22 DIAGNOSIS — R000 Tachycardia, unspecified: Secondary | ICD-10-CM | POA: Diagnosis not present

## 2020-09-22 DIAGNOSIS — Z681 Body mass index (BMI) 19 or less, adult: Secondary | ICD-10-CM | POA: Diagnosis not present

## 2020-09-22 DIAGNOSIS — Z20822 Contact with and (suspected) exposure to covid-19: Secondary | ICD-10-CM | POA: Diagnosis present

## 2020-09-22 DIAGNOSIS — F17210 Nicotine dependence, cigarettes, uncomplicated: Secondary | ICD-10-CM | POA: Diagnosis present

## 2020-09-22 DIAGNOSIS — Z8673 Personal history of transient ischemic attack (TIA), and cerebral infarction without residual deficits: Secondary | ICD-10-CM | POA: Diagnosis not present

## 2020-09-22 DIAGNOSIS — Z7982 Long term (current) use of aspirin: Secondary | ICD-10-CM | POA: Diagnosis not present

## 2020-09-22 DIAGNOSIS — F17200 Nicotine dependence, unspecified, uncomplicated: Secondary | ICD-10-CM | POA: Diagnosis not present

## 2020-09-22 DIAGNOSIS — Z9981 Dependence on supplemental oxygen: Secondary | ICD-10-CM

## 2020-09-22 DIAGNOSIS — I10 Essential (primary) hypertension: Secondary | ICD-10-CM | POA: Diagnosis present

## 2020-09-22 DIAGNOSIS — R079 Chest pain, unspecified: Secondary | ICD-10-CM | POA: Diagnosis not present

## 2020-09-22 DIAGNOSIS — J441 Chronic obstructive pulmonary disease with (acute) exacerbation: Secondary | ICD-10-CM | POA: Diagnosis present

## 2020-09-22 DIAGNOSIS — C679 Malignant neoplasm of bladder, unspecified: Secondary | ICD-10-CM | POA: Diagnosis present

## 2020-09-22 DIAGNOSIS — J9621 Acute and chronic respiratory failure with hypoxia: Secondary | ICD-10-CM | POA: Diagnosis present

## 2020-09-22 DIAGNOSIS — J9601 Acute respiratory failure with hypoxia: Secondary | ICD-10-CM | POA: Diagnosis not present

## 2020-09-22 DIAGNOSIS — Z9221 Personal history of antineoplastic chemotherapy: Secondary | ICD-10-CM

## 2020-09-22 DIAGNOSIS — R0602 Shortness of breath: Secondary | ICD-10-CM | POA: Diagnosis not present

## 2020-09-22 DIAGNOSIS — E876 Hypokalemia: Secondary | ICD-10-CM | POA: Diagnosis present

## 2020-09-22 LAB — CBC WITH DIFFERENTIAL/PLATELET
Abs Immature Granulocytes: 0.02 10*3/uL (ref 0.00–0.07)
Basophils Absolute: 0 10*3/uL (ref 0.0–0.1)
Basophils Relative: 0 %
Eosinophils Absolute: 0.1 10*3/uL (ref 0.0–0.5)
Eosinophils Relative: 1 %
HCT: 40.6 % (ref 36.0–46.0)
Hemoglobin: 13 g/dL (ref 12.0–15.0)
Immature Granulocytes: 0 %
Lymphocytes Relative: 18 %
Lymphs Abs: 1.1 10*3/uL (ref 0.7–4.0)
MCH: 31.9 pg (ref 26.0–34.0)
MCHC: 32 g/dL (ref 30.0–36.0)
MCV: 99.5 fL (ref 80.0–100.0)
Monocytes Absolute: 0.6 10*3/uL (ref 0.1–1.0)
Monocytes Relative: 9 %
Neutro Abs: 4.2 10*3/uL (ref 1.7–7.7)
Neutrophils Relative %: 72 %
Platelets: 293 10*3/uL (ref 150–400)
RBC: 4.08 MIL/uL (ref 3.87–5.11)
RDW: 15.3 % (ref 11.5–15.5)
WBC: 6 10*3/uL (ref 4.0–10.5)
nRBC: 0 % (ref 0.0–0.2)

## 2020-09-22 LAB — CBC
HCT: 40.1 % (ref 36.0–46.0)
Hemoglobin: 12.7 g/dL (ref 12.0–15.0)
MCH: 32.4 pg (ref 26.0–34.0)
MCHC: 31.7 g/dL (ref 30.0–36.0)
MCV: 102.3 fL — ABNORMAL HIGH (ref 80.0–100.0)
Platelets: 274 10*3/uL (ref 150–400)
RBC: 3.92 MIL/uL (ref 3.87–5.11)
RDW: 15.5 % (ref 11.5–15.5)
WBC: 4.7 10*3/uL (ref 4.0–10.5)
nRBC: 0 % (ref 0.0–0.2)

## 2020-09-22 LAB — COMPREHENSIVE METABOLIC PANEL
ALT: 14 U/L (ref 0–44)
AST: 17 U/L (ref 15–41)
Albumin: 3.7 g/dL (ref 3.5–5.0)
Alkaline Phosphatase: 85 U/L (ref 38–126)
Anion gap: 10 (ref 5–15)
BUN: 12 mg/dL (ref 8–23)
CO2: 31 mmol/L (ref 22–32)
Calcium: 9.2 mg/dL (ref 8.9–10.3)
Chloride: 99 mmol/L (ref 98–111)
Creatinine, Ser: 0.79 mg/dL (ref 0.44–1.00)
GFR, Estimated: >60 mL/min (ref >60)
Glucose, Bld: 88 mg/dL (ref 70–99)
Potassium: 4.4 mmol/L (ref 3.5–5.1)
Sodium: 140 mmol/L (ref 135–145)
Total Bilirubin: 0.2 mg/dL — ABNORMAL LOW (ref 0.3–1.2)
Total Protein: 8.3 g/dL — ABNORMAL HIGH (ref 6.5–8.1)

## 2020-09-22 LAB — TROPONIN I (HIGH SENSITIVITY)
Troponin I (High Sensitivity): 43 ng/L — ABNORMAL HIGH (ref <18)
Troponin I (High Sensitivity): 42 ng/L — ABNORMAL HIGH (ref <18)

## 2020-09-22 LAB — LACTIC ACID, PLASMA
Lactic Acid, Venous: 0.8 mmol/L (ref 0.5–1.9)
Lactic Acid, Venous: 0.7 mmol/L (ref 0.5–1.9)

## 2020-09-22 LAB — CREATININE, SERUM
Creatinine, Ser: 0.73 mg/dL (ref 0.44–1.00)
GFR, Estimated: >60 mL/min (ref >60)

## 2020-09-22 LAB — RESP PANEL BY RT-PCR (FLU A&B, COVID) ARPGX2
Influenza A by PCR: NEGATIVE
Influenza B by PCR: NEGATIVE
SARS Coronavirus 2 by RT PCR: NEGATIVE

## 2020-09-22 MED ORDER — FLUTICASONE FUROATE-VILANTEROL 100-25 MCG/INH IN AEPB
1.0000 | INHALATION_SPRAY | Freq: Every day | RESPIRATORY_TRACT | Status: DC
  Administered 2020-09-23 – 2020-09-24 (×2): 1 via RESPIRATORY_TRACT
  Filled 2020-09-22: qty 28

## 2020-09-22 MED ORDER — OXYCODONE HCL 5 MG PO TABS
5.0000 mg | ORAL_TABLET | Freq: Four times a day (QID) | ORAL | Status: DC | PRN
  Administered 2020-09-23: 5 mg via ORAL
  Filled 2020-09-22: qty 1

## 2020-09-22 MED ORDER — METHYLPREDNISOLONE SODIUM SUCC 40 MG IJ SOLR
40.0000 mg | Freq: Four times a day (QID) | INTRAMUSCULAR | Status: AC
  Administered 2020-09-22 – 2020-09-23 (×4): 40 mg via INTRAVENOUS
  Filled 2020-09-22 (×4): qty 1

## 2020-09-22 MED ORDER — SODIUM CHLORIDE 0.9 % IV SOLN
1.0000 g | Freq: Once | INTRAVENOUS | Status: AC
  Administered 2020-09-22: 1 g via INTRAVENOUS
  Filled 2020-09-22: qty 10

## 2020-09-22 MED ORDER — SODIUM CHLORIDE 0.9 % IV SOLN
500.0000 mg | Freq: Once | INTRAVENOUS | Status: AC
  Administered 2020-09-22: 500 mg via INTRAVENOUS
  Filled 2020-09-22: qty 500

## 2020-09-22 MED ORDER — ALBUTEROL SULFATE (2.5 MG/3ML) 0.083% IN NEBU: 2.5000 mg | INHALATION_SOLUTION | RESPIRATORY_TRACT | Status: DC | PRN

## 2020-09-22 MED ORDER — LIDOCAINE-PRILOCAINE 2.5-2.5 % EX CREA
1.0000 "application " | TOPICAL_CREAM | Freq: Every day | CUTANEOUS | Status: DC | PRN
  Filled 2020-09-22: qty 5

## 2020-09-22 MED ORDER — UMECLIDINIUM BROMIDE 62.5 MCG/INH IN AEPB
1.0000 | INHALATION_SPRAY | Freq: Every day | RESPIRATORY_TRACT | Status: DC
  Administered 2020-09-23 – 2020-09-24 (×2): 1 via RESPIRATORY_TRACT
  Filled 2020-09-22: qty 7

## 2020-09-22 MED ORDER — DIAZEPAM 5 MG PO TABS: 5.0000 mg | ORAL_TABLET | Freq: Three times a day (TID) | ORAL | Status: DC | PRN

## 2020-09-22 MED ORDER — LEVOFLOXACIN IN D5W 750 MG/150ML IV SOLN
750.0000 mg | INTRAVENOUS | Status: DC
  Administered 2020-09-23: 750 mg via INTRAVENOUS
  Filled 2020-09-22: qty 150

## 2020-09-22 MED ORDER — DRONABINOL 2.5 MG PO CAPS: 5.0000 mg | ORAL_CAPSULE | Freq: Every day | ORAL | Status: DC | PRN

## 2020-09-22 MED ORDER — SODIUM CHLORIDE 0.9 % IV BOLUS
500.0000 mL | Freq: Once | INTRAVENOUS | Status: AC
  Administered 2020-09-22: 500 mL via INTRAVENOUS

## 2020-09-22 MED ORDER — BENZONATATE 100 MG PO CAPS: 100.0000 mg | ORAL_CAPSULE | Freq: Every day | ORAL | Status: DC

## 2020-09-22 MED ORDER — ASPIRIN EC 81 MG PO TBEC
81.0000 mg | DELAYED_RELEASE_TABLET | Freq: Every day | ORAL | Status: DC
  Administered 2020-09-23 – 2020-09-24 (×2): 81 mg via ORAL
  Filled 2020-09-22 (×2): qty 1

## 2020-09-22 MED ORDER — AMLODIPINE BESYLATE 10 MG PO TABS
10.0000 mg | ORAL_TABLET | Freq: Every day | ORAL | Status: DC
Start: 2020-09-23 — End: 2020-09-24
  Administered 2020-09-23 – 2020-09-24 (×2): 10 mg via ORAL
  Filled 2020-09-22 (×2): qty 1

## 2020-09-22 MED ORDER — FLUTICASONE-UMECLIDIN-VILANT 100-62.5-25 MCG/INH IN AEPB: 1.0000 | INHALATION_SPRAY | Freq: Every day | RESPIRATORY_TRACT | Status: DC

## 2020-09-22 MED ORDER — SODIUM CHLORIDE 0.9 % IV SOLN: INTRAVENOUS | Status: DC | PRN

## 2020-09-22 MED ORDER — SODIUM CHLORIDE 0.45 % IV SOLN: INTRAVENOUS | Status: DC

## 2020-09-22 MED ORDER — PREDNISONE 20 MG PO TABS
40.0000 mg | ORAL_TABLET | Freq: Every day | ORAL | Status: DC
  Administered 2020-09-24: 40 mg via ORAL
  Filled 2020-09-22 (×2): qty 2

## 2020-09-22 MED ORDER — AMITRIPTYLINE HCL 10 MG PO TABS: 10.0000 mg | ORAL_TABLET | Freq: Every evening | ORAL | Status: DC | PRN

## 2020-09-22 MED ORDER — ACETAMINOPHEN 500 MG PO TABS: 1000.0000 mg | ORAL_TABLET | Freq: Three times a day (TID) | ORAL | Status: DC | PRN

## 2020-09-22 MED ORDER — CITALOPRAM HYDROBROMIDE 20 MG PO TABS
10.0000 mg | ORAL_TABLET | Freq: Every day | ORAL | Status: DC
  Administered 2020-09-23 – 2020-09-24 (×2): 10 mg via ORAL
  Filled 2020-09-22 (×2): qty 1

## 2020-09-22 MED ORDER — ONDANSETRON HCL 4 MG PO TABS: 8.0000 mg | ORAL_TABLET | Freq: Three times a day (TID) | ORAL | Status: DC | PRN

## 2020-09-22 MED ORDER — IOHEXOL 350 MG/ML SOLN
100.0000 mL | Freq: Once | INTRAVENOUS | Status: AC | PRN
  Administered 2020-09-22: 60 mL via INTRAVENOUS

## 2020-09-22 MED ORDER — MEGESTROL ACETATE 20 MG PO TABS
20.0000 mg | ORAL_TABLET | Freq: Every day | ORAL | Status: DC
  Administered 2020-09-23 – 2020-09-24 (×2): 20 mg via ORAL
  Filled 2020-09-22 (×2): qty 1

## 2020-09-22 MED ORDER — PROCHLORPERAZINE MALEATE 10 MG PO TABS: 10.0000 mg | ORAL_TABLET | Freq: Four times a day (QID) | ORAL | Status: DC | PRN

## 2020-09-22 MED ORDER — IPRATROPIUM-ALBUTEROL 0.5-2.5 (3) MG/3ML IN SOLN
3.0000 mL | Freq: Four times a day (QID) | RESPIRATORY_TRACT | Status: DC
  Administered 2020-09-23 – 2020-09-24 (×7): 3 mL via RESPIRATORY_TRACT
  Filled 2020-09-22 (×7): qty 3

## 2020-09-22 MED ORDER — ENOXAPARIN SODIUM 30 MG/0.3ML ~~LOC~~ SOLN
30.0000 mg | SUBCUTANEOUS | Status: DC
  Administered 2020-09-22: 30 mg via SUBCUTANEOUS
  Filled 2020-09-22: qty 0.3

## 2020-09-22 NOTE — ED Notes (Signed)
Engineer, technical sales at bedside

## 2020-09-22 NOTE — ED Notes (Signed)
States she was at her MD for follow up appt due to having pneumonia, POX readings were low and was sent to our ED, pt denies any distress, no fevers, no nausea or vomiting

## 2020-09-22 NOTE — ED Notes (Signed)
Phone Handoff report provided to [facility] with [employer]

## 2020-09-22 NOTE — ED Notes (Signed)
Phone Handoff Report given to [facility] at [facility]

## 2020-09-22 NOTE — ED Notes (Signed)
ED Provider at bedside. 

## 2020-09-22 NOTE — H&P (Signed)
History and Physical   Donna Curry UVO:[id number] DOB: 02-Sep-1955 DOA: 09/22/2020  Referring MD/NP/PA: Dr. Nanda Quinton  PCP: [name], NP   Patient coming from: Home via [facility]  Chief Complaint: Shortness of breath and hypoxia  HPI: Donna Curry is a 65 y.o. female with medical history significant of bladder cancer, asthma with COPD who continues to smoke cigarette, hypertension, history of TIA, who has been battling pneumonia since last week.  Patient apparently has gone through 2 rounds of antibiotics but continues to be hypoxic.  She continues to have shortness of breath and cough.  She went to [facility] today where she was seen and evaluated.  Patient was noted to now require 2 L of oxygen.  She has no established diagnosis for COPD but is currently on breathing treatments including inhaled steroids.  She is a smoker also.  She carries the diagnosis of asthma.  Patient most likely has chronic respiratory failure from COPD or asthma and now may have gotten to where she needs home oxygen as a result of her recent pneumonia..  ED Course: Temperature 98.8 blood pressure is 154/93 pulse 110 respiratory 26 oxygen sat 71% on room air 90% 2 L.  CBC and chemistry all appear to be within normal.  Lactic acid 0.8 troponin 43 and then 42.  COVID-19 is negative.  Chest x-ray showed chronic hyperinflation and emphysema.  CT angiogram of the chest shows no PE.  There is a new peripheral airspace opacity in the right lower lobe probably pneumonia also in the lower lingula.  Patient therefore being admitted with recurrent pneumonia with possible COPD exacerbation leading to acute hypoxemia.  Review of Systems: As per HPI otherwise 10 point review of systems negative.    Past Medical History:  Diagnosis Date  . Anemia   . Asthma   . Dyspnea    with exertion   . Family history of adverse reaction to anesthesia    brother wakes up and dont know who he is and  sister has n/v  . Goals of care, counseling/discussion 02/09/2018  . History of blood transfusion   . Hypertension   . Iron deficiency anemia due to chronic blood loss 03/01/2018  . Measles as a child  . Mumps as a child  . TIA (transient ischemic attack)   . Tobacco abuse     Past Surgical History:  Procedure Laterality Date  . CYSTOSCOPY W/ URETERAL STENT PLACEMENT Right 12/13/2017   Procedure: CYSTOSCOPY WITH RIGHT RETROGRADE PYELOGRAM/URETERAL RIGHT STENT PLACEMENT;  Surgeon: [name], MD;  Location: [facility];  Service: Urology;  Laterality: Right;  . IR IMAGING GUIDED PORT INSERTION  02/23/2018  . TRANSURETHRAL RESECTION OF BLADDER TUMOR N/A 12/13/2017   Procedure: TRANSURETHRAL RESECTION OF BLADDER TUMOR (TURBT);  Surgeon: [name], MD;  Location: [facility];  Service: Urology;  Laterality: N/A;  . TRANSURETHRAL RESECTION OF BLADDER TUMOR N/A 10/03/2018   Procedure: TRANSURETHRAL RESECTION OF BLADDER TUMOR (TURBT);  Surgeon: [name], MD;  Location: [facility];  Service: Urology;  Laterality: N/A;  . TRANSURETHRAL RESECTION OF BLADDER TUMOR WITH GYRUS (TURBT-GYRUS)     [name] 12-13-17  . tubes tied    . uterine ablation     about 2005     reports that she has been smoking cigarettes. She has been smoking about 0.25 packs per day. She has never used smokeless tobacco. She reports that she does not drink  alcohol and does not use drugs.  No Known Allergies  Family History  Problem Relation Age of Onset  . Hypertension Mother   . Diabetes Father   . Hypertension Brother   . HIV Brother      Prior to Admission medications   Medication Sig Start Date End Date Taking? Authorizing Provider  acetaminophen (TYLENOL) 500 MG tablet Take 1,000 mg by mouth every 8 (eight) hours as needed for moderate pain.   Yes [provider]  albuterol (PROVENTIL) (2.5 MG/3ML) 0.083% nebulizer solution Take 3 mLs (2.5 mg total) by nebulization every 6 (six) hours as needed  for wheezing or shortness of breath. 09/08/20  Yes [name], NP  amitriptyline (ELAVIL) 10 MG tablet Take 1 tablet (10 mg total) by mouth at bedtime as needed for sleep. 05/28/19  Yes [name], NP  amLODipine (NORVASC) 10 MG tablet Take 1 tablet (10 mg total) by mouth daily. 08/18/20  Yes [place], Holli Humbles, NP  aspirin EC 81 MG tablet Take 81 mg by mouth daily.   Yes [provider]  benzonatate (TESSALON) 100 MG capsule Take 1 capsule (100 mg total) by mouth 3 (three) times daily as needed. Patient taking differently: Take 100 mg by mouth daily. 09/14/20  Yes [name], NP  citalopram (CELEXA) 10 MG tablet Take 1 tablet (10 mg total) by mouth daily. 03/25/19  Yes [name], NP  diazepam (VALIUM) 5 MG tablet Take 1 tablet (5 mg total) by mouth every 8 (eight) hours as needed for anxiety. 09/03/20  Yes [name], MD  dronabinol (MARINOL) 5 MG capsule Take 1 capsule (5 mg total) by mouth 2 (two) times daily before lunch and supper. Patient taking differently: Take 5 mg by mouth daily as needed (nausea). 04/13/20  Yes [name], [name], MD  lidocaine-prilocaine (EMLA) cream Apply to affected area once Patient taking differently: Apply 1 application topically daily as needed (pain). Apply to affected area once 11/06/18  Yes [name], [name], MD  megestrol (MEGACE) 20 MG tablet Take 1 tablet (20 mg total) by mouth daily. 03/27/19  Yes [name], MD  ondansetron (ZOFRAN) 8 MG tablet Take 1 tablet (8 mg total) by mouth every 8 (eight) hours as needed for nausea or vomiting. 01/16/19  Yes [name], [name], MD  oxyCODONE (OXY IR/ROXICODONE) 5 MG immediate release tablet Take 1 tablet (5 mg total) by mouth every 6 (six) hours as needed for severe pain. 08/18/20 11/16/20 Yes [place], Holli Humbles, NP  prochlorperazine (COMPAZINE) 10 MG tablet Take 1 tablet (10 mg total) by mouth every 6 (six) hours as needed for nausea or vomiting. 03/27/19  Yes [name], [name],  MD  TRELEGY ELLIPTA 100-62.5-25 MCG/INH AEPB INHALE 1 PUFF  BY MOUTH TWICE DAILY . APPOINTMENT REQUIRED FOR FUTURE REFILLS Patient taking differently: Inhale 1 puff into the lungs daily. 09/05/20  Yes [name], NP  amoxicillin-clavulanate (AUGMENTIN) 875-125 MG tablet Take 1 tablet by mouth 2 (two) times daily. Patient not taking: No sig reported 09/15/20   [name], NP  azithromycin (ZITHROMAX) 250 MG tablet Take 2 tabs by mouth today, then 1 tab once daily for 4 more days Patient not taking: Reported on 09/22/2020 09/14/20   [name], NP  ondansetron (ZOFRAN ODT) 4 MG disintegrating tablet Take 1 tablet (4 mg total) by mouth every 8 (eight) hours as needed for nausea or vomiting. Patient not taking: No sig reported 02/04/20   [place], Holli Humbles, NP  Vitamin D, Ergocalciferol, (  DRISDOL) 1.25 MG (50000 UT) CAPS capsule Take 1 capsule by mouth once a week Patient not taking: No sig reported 12/26/18   [name], MD    Physical Exam: Vitals:   09/22/20 1630 09/22/20 1639 09/22/20 1751 09/22/20 2046  BP: (!) 149/90  (!) 154/93 111/77  Pulse: (!) 102  98 92  Resp: (!) 24  20 20   Temp:    97.6 F (36.4 C)  TempSrc:    Oral  SpO2: 90% 92% 98% (!) 77%  Weight:      Height:          Constitutional: Acutely ill looking with no distress Vitals:   09/22/20 1630 09/22/20 1639 09/22/20 1751 09/22/20 2046  BP: (!) 149/90  (!) 154/93 111/77  Pulse: (!) 102  98 92  Resp: (!) 24  20 20   Temp:    97.6 F (36.4 C)  TempSrc:    Oral  SpO2: 90% 92% 98% (!) 77%  Weight:      Height:       Eyes: PERRL, lids and conjunctivae normal ENMT: Mucous membranes are moist. Posterior pharynx clear of any exudate or lesions.Normal dentition.  Neck: normal, supple, no masses, no thyromegaly Respiratory: Coarse breath sound bilaterally with some mild expiratory wheezing normal respiratory effort. No accessory muscle use.  Cardiovascular: Sinus tachycardia, no murmurs / rubs  / gallops. No extremity edema. 2+ pedal pulses. No carotid bruits.  Abdomen: no tenderness, no masses palpated. No hepatosplenomegaly. Bowel sounds positive.  Musculoskeletal: no clubbing / cyanosis. No joint deformity upper and lower extremities. Good ROM, no contractures. Normal muscle tone.  Skin: no rashes, lesions, ulcers. No induration Neurologic: CN 2-12 grossly intact. Sensation intact, DTR normal. Strength 5/5 in all 4.  Psychiatric: Normal judgment and insight. Alert and oriented x 3. Normal mood.     Labs on Admission: I have personally reviewed following labs and imaging studies  CBC: Recent Labs  Lab 09/22/20 1545  WBC 6.0  NEUTROABS 4.2  HGB 13.0  HCT 40.6  MCV 99.5  PLT 629   Basic Metabolic Panel: Recent Labs  Lab 09/22/20 1545  NA 140  K 4.4  CL 99  CO2 31  GLUCOSE 88  BUN 12  CREATININE 0.79  CALCIUM 9.2   GFR: Estimated Creatinine Clearance: 37.7 mL/min (by C-G formula based on SCr of 0.79 mg/dL). Liver Function Tests: Recent Labs  Lab 09/22/20 1545  AST 17  ALT 14  ALKPHOS 85  BILITOT 0.2*  PROT 8.3*  ALBUMIN 3.7   No results for input(s): LIPASE, AMYLASE in the last 168 hours. No results for input(s): AMMONIA in the last 168 hours. Coagulation Profile: No results for input(s): INR, PROTIME in the last 168 hours. Cardiac Enzymes: No results for input(s): CKTOTAL, CKMB, CKMBINDEX, TROPONINI in the last 168 hours. BNP (last 3 results) No results for input(s): PROBNP in the last 8760 hours. HbA1C: No results for input(s): HGBA1C in the last 72 hours. CBG: No results for input(s): GLUCAP in the last 168 hours. Lipid Profile: No results for input(s): CHOL, HDL, LDLCALC, TRIG, CHOLHDL, LDLDIRECT in the last 72 hours. Thyroid Function Tests: No results for input(s): TSH, T4TOTAL, FREET4, T3FREE, THYROIDAB in the last 72 hours. Anemia Panel: No results for input(s): VITAMINB12, FOLATE, FERRITIN, TIBC, IRON, RETICCTPCT in the last 72  hours. Urine analysis:    Component Value Date/Time   COLORURINE YELLOW 02/06/2019 1429   APPEARANCEUR CLEAR 02/06/2019 1429   LABSPEC 1.015 02/06/2019 1429  PHURINE 7.0 02/06/2019 1429   GLUCOSEU NEGATIVE 02/06/2019 1429   HGBUR NEGATIVE 02/06/2019 1429   BILIRUBINUR NEGATIVE 02/06/2019 1429   BILIRUBINUR 1+ 11/30/2017 1348   [place] 02/06/2019 1429   PROTEINUR NEGATIVE 02/06/2019 1429   UROBILINOGEN 2.0 (A) 11/30/2017 1348   NITRITE NEGATIVE 02/06/2019 1429   LEUKOCYTESUR TRACE (A) 02/06/2019 1429   Sepsis Labs: @LABRCNTIP (procalcitonin:4,lacticidven:4) ) Recent Results (from the past 240 hour(s))  Resp Panel by RT-PCR (Flu A&B, Covid) Peripheral     Status: None   Collection Time: 09/22/20  3:45 PM   Specimen: Peripheral; Nasopharyngeal(NP) swabs in vial transport medium  Result Value Ref Range Status   SARS Coronavirus 2 by RT PCR NEGATIVE NEGATIVE Final    Comment: (NOTE) SARS-CoV-2 target nucleic acids are NOT DETECTED.  The SARS-CoV-2 RNA is generally detectable in upper respiratory specimens during the acute phase of infection. The lowest concentration of SARS-CoV-2 viral copies this assay can detect is 138 copies/mL. A negative result does not preclude SARS-Cov-2 infection and should not be used as the sole basis for treatment or other patient management decisions. A negative result may occur with  improper specimen collection/handling, submission of specimen other than nasopharyngeal swab, presence of viral mutation(s) within the areas targeted by this assay, and inadequate number of viral copies(<138 copies/mL). A negative result must be combined with clinical observations, patient history, and epidemiological information. The expected result is Negative.  Fact Sheet for Patients:  EntrepreneurPulse.com.au  Fact Sheet for Healthcare Providers:  IncredibleEmployment.be  This test is no t yet approved or cleared  by the [place] FDA and  has been authorized for detection and/or diagnosis of SARS-CoV-2 by FDA under an Emergency Use Authorization (EUA). This EUA will remain  in effect (meaning this test can be used) for the duration of the COVID-19 declaration under Section 564(b)(1) of the Act, 21 U.S.C.section 360bbb-3(b)(1), unless the authorization is terminated  or revoked sooner.       Influenza A by PCR NEGATIVE NEGATIVE Final   Influenza B by PCR NEGATIVE NEGATIVE Final    Comment: (NOTE) The Xpert Xpress SARS-CoV-2/FLU/RSV plus assay is intended as an aid in the diagnosis of influenza from Nasopharyngeal swab specimens and should not be used as a sole basis for treatment. Nasal washings and aspirates are unacceptable for Xpert Xpress SARS-CoV-2/FLU/RSV testing.  Fact Sheet for Patients: EntrepreneurPulse.com.au  Fact Sheet for Healthcare Providers: IncredibleEmployment.be  This test is not yet approved or cleared by the [place] FDA and has been authorized for detection and/or diagnosis of SARS-CoV-2 by FDA under an Emergency Use Authorization (EUA). This EUA will remain in effect (meaning this test can be used) for the duration of the COVID-19 declaration under Section 564(b)(1) of the Act, 21 U.S.C. section 360bbb-3(b)(1), unless the authorization is terminated or revoked.  Performed at [facility], [address] [place], [place] [address]      Radiological Exams on Admission: CT Angio Chest PE W and/or Wo Contrast  Result Date: 09/22/2020 CLINICAL DATA:  Pneumonia, hypoxia. EXAM: CT ANGIOGRAPHY CHEST WITH CONTRAST TECHNIQUE: Multidetector CT imaging of the chest was performed using the standard protocol during bolus administration of intravenous contrast. Multiplanar CT image reconstructions and MIPs were obtained to evaluate the vascular anatomy. CONTRAST:  51mL OMNIPAQUE IOHEXOL 350 MG/ML SOLN COMPARISON:   09/22/2020 chest radiograph and chest CT from 08/18/2020 FINDINGS: Cardiovascular: No filling defect is identified in the pulmonary arterial tree to suggest pulmonary embolus. Coronary, aortic arch, and  branch vessel atherosclerotic vascular disease. Mediastinum/Nodes: Unremarkable Lungs/Pleura: Severe emphysema. New peripheral airspace opacity anteriorly in the right lower lobe on image 89 series 5. Stable chronic scarring or atelectasis along the posterior basal segments of both lower lobes. Stable scarring in the left upper lobe. Upper Abdomen: Low-density fullness of both adrenal glands, unchanged. Musculoskeletal: Unremarkable Review of the MIP images confirms the above findings. IMPRESSION: 1. No filling defect is identified in the pulmonary arterial tree to suggest pulmonary embolus. 2. New peripheral airspace opacity anteriorly in the right lower lobe, favoring pneumonia. Similar smaller region of new airspace opacity in the lower lingula. 3. Coronary, aortic arch, and branch vessel atherosclerotic vascular disease. 4. Stable chronic scarring or atelectasis along the posterior basal segments of both lower lobes and left upper lobe. 5. Low-density fullness of both adrenal glands, unchanged, likely benign. 6. Emphysema and aortic atherosclerosis. Aortic Atherosclerosis (ICD10-I70.0) and Emphysema (ICD10-J43.9). Electronically Signed   By: Van Clines M.D.   On: 09/22/2020 17:21   DG Chest Portable 1 View  Result Date: 09/22/2020 CLINICAL DATA:  Shortness of breath, hypoxemia EXAM: PORTABLE CHEST 1 VIEW COMPARISON:  09/14/2020, CT 08/19/2020, 05/13/2019 FINDINGS: Hyperinflation with emphysematous disease. Right-sided central venous port tip over the SVC. Clearing of right CP angle focal airspace disease. Chronic pleural blunting. Stable cardiomediastinal silhouette with aortic atherosclerosis. No pneumothorax. IMPRESSION: Chronic hyperinflation and emphysematous disease. Improved aeration at the right  base since prior. Electronically Signed   By: Donavan Foil M.D.   On: 09/22/2020 16:25    Assessment/Plan Principal Problem:   Acute on chronic respiratory failure with hypoxia (HCC) Active Problems:   TOBACCO ABUSE   Essential hypertension   Hypokalemia   Hypertensive cardiomyopathy, 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HCC)   Bladder cancer (HCC)     #1 acute on chronic respiratory failure with hypoxia: Secondary to recent pneumonia but also most likely underlying COPD.  Patient will be treated with antibiotics, steroids with breathing treatments.  May require home oxygen at time of discharge.  #2 essential hypertension: Continue home regimen  #3 COPD: Suspected COPD exacerbation with asthma.  Initiate steroids breathing treatment as well as antibiotics.  Continue to oxygen.  Attempt titration if not possible discharge patient with home O2 when ready  #4 hypokalemia: Continue to replete potassium.  #5 history of bladder cancer: Patient is stable.  #6 history of tobacco abuse: Offered nicotine patch and counseling   DVT prophylaxis: Lovenox Code Status: Full code Family Communication: No family at bedside Disposition Plan: Home Consults called: None Admission status: Inpatient  Severity of Illness: The appropriate patient status for this patient is INPATIENT. Inpatient status is judged to be reasonable and necessary in order to provide the required intensity of service to ensure the patient's safety. The patient's presenting symptoms, physical exam findings, and initial radiographic and laboratory data in the context of their chronic comorbidities is felt to place them at high risk for further clinical deterioration. Furthermore, it is not anticipated that the patient will be medically stable for discharge from the hospital within 2 midnights of admission. The following factors support the patient status of inpatient.   " The patient's presenting symptoms include shortness of breath. " The  worrisome physical exam findings include mild expiratory wheezing. " The initial radiographic and laboratory data are worrisome because of hypoxemia. " The chronic co-morbidities include emphysema on x-ray.   * I certify that at the point of admission it is my clinical judgment that the patient will require inpatient hospital  care spanning beyond 2 midnights from the point of admission due to high intensity of service, high risk for further deterioration and high frequency of surveillance required.Barbette Merino MD [facility] Pager [phone]  If 7PM-7AM, please contact night-coverage [email or web address] Password [facility]  09/22/2020, 9:59 PM

## 2020-09-22 NOTE — Progress Notes (Signed)
Subjective:    Patient ID: Donna Curry, female    DOB: [birth year], 65 y.o.   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  She was seen for a video visit on 09/14/20 with chief complaint of cough.  CXR was performed and noted PNA.  She was treated with azithromycin and augmentin.  Today she presents with a family member for follow up. She reports feeling "OK."  Reports that her breathing "is about the same" as it has been.    Review of Systems    see HPI  Past Medical History:  Diagnosis Date  . Anemia   . Asthma   . Dyspnea    with exertion   . Family history of adverse reaction to anesthesia    brother wakes up and dont know who he is and sister has n/v  . Goals of care, counseling/discussion 02/09/2018  . History of blood transfusion   . Hypertension   . Iron deficiency anemia due to chronic blood loss 03/01/2018  . Measles as a child  . Mumps as a child  . TIA (transient ischemic attack)   . Tobacco abuse      Social History   Socioeconomic History  . Marital status: Single    Spouse name: Not on file  . Number of children: Not on file  . Years of education: Not on file  . Highest education level: Not on file  Occupational History  . Not on file  Tobacco Use  . Smoking status: Current Every Day Smoker    Packs/day: 0.25    Types: Cigarettes  . Smokeless tobacco: Never Used  . Tobacco comment: smoking 3 cigarettes a day  Vaping Use  . Vaping Use: Former  Substance and Sexual Activity  . Alcohol use: No    Alcohol/week: 0.0 standard drinks  . Drug use: Never  . Sexual activity: Not Currently  Other Topics Concern  . Not on file  Social History Narrative   Denies hx of drug use   Single   1 daughter age 50 lives with daughter and grandson who is 65.   Works as a Sports coach for [employer].    Completed 12th grade.   Social Determinants of Health   Financial Resource Strain: Not on file  Food Insecurity: Not on file  Transportation Needs: Not on file  Physical Activity: Not on file  Stress: Not on file  Social Connections: Not on file  Intimate Partner Violence: Not on file    Past Surgical History:  Procedure Laterality Date  . CYSTOSCOPY W/ URETERAL STENT PLACEMENT Right 12/13/2017   Procedure: CYSTOSCOPY WITH RIGHT RETROGRADE PYELOGRAM/URETERAL RIGHT STENT PLACEMENT;  Surgeon: [name], MD;  Location: [facility];  Service: Urology;  Laterality: Right;  . IR IMAGING GUIDED PORT INSERTION  02/23/2018  . TRANSURETHRAL RESECTION OF BLADDER TUMOR N/A 12/13/2017   Procedure: TRANSURETHRAL RESECTION OF BLADDER TUMOR (TURBT);  Surgeon: [name], MD;  Location: [facility];  Service: Urology;  Laterality: N/A;  . TRANSURETHRAL RESECTION OF BLADDER TUMOR N/A 10/03/2018   Procedure: TRANSURETHRAL RESECTION OF BLADDER TUMOR (TURBT);  Surgeon: [name], MD;  Location: [facility];  Service: Urology;  Laterality: N/A;  . TRANSURETHRAL RESECTION OF BLADDER TUMOR WITH GYRUS (TURBT-GYRUS)     [name] 12-13-17  .  tubes tied    . uterine ablation     about 2005    Family History  Problem Relation Age of Onset  . Hypertension Mother   . Diabetes Father   . Hypertension Brother   . HIV Brother     No Known Allergies  Current Outpatient Medications on File Prior to Visit  Medication Sig Dispense Refill  . acetaminophen (TYLENOL) 500 MG tablet Take 1,000 mg by mouth every 8 (eight) hours as needed for moderate pain.    Marland Kitchen albuterol (PROVENTIL) (2.5 MG/3ML) 0.083% nebulizer solution Take 3 mLs (2.5 mg total) by nebulization every 6 (six) hours as needed for wheezing or shortness of breath. 75 mL 3  . amitriptyline (ELAVIL) 10 MG tablet Take 1 tablet (10 mg total) by mouth at bedtime as needed for sleep. 30 tablet 1  . amLODipine (NORVASC) 10 MG tablet Take 1 tablet (10 mg  total) by mouth daily. 90 tablet 0  . amoxicillin-clavulanate (AUGMENTIN) 875-125 MG tablet Take 1 tablet by mouth 2 (two) times daily. 14 tablet 0  . aspirin EC 81 MG tablet Take 81 mg by mouth daily.    Marland Kitchen azithromycin (ZITHROMAX) 250 MG tablet Take 2 tabs by mouth today, then 1 tab once daily for 4 more days 6 tablet 0  . benzonatate (TESSALON) 100 MG capsule Take 1 capsule (100 mg total) by mouth 3 (three) times daily as needed. 20 capsule 0  . citalopram (CELEXA) 10 MG tablet Take 1 tablet (10 mg total) by mouth daily. 90 tablet 1  . diazepam (VALIUM) 5 MG tablet Take 1 tablet (5 mg total) by mouth every 8 (eight) hours as needed for anxiety. 40 tablet 0  . dronabinol (MARINOL) 5 MG capsule Take 1 capsule (5 mg total) by mouth 2 (two) times daily before lunch and supper. 60 capsule 0  . lidocaine-prilocaine (EMLA) cream Apply to affected area once 30 g 3  . megestrol (MEGACE) 20 MG tablet Take 1 tablet (20 mg total) by mouth daily. 30 tablet 0  . ondansetron (ZOFRAN ODT) 4 MG disintegrating tablet Take 1 tablet (4 mg total) by mouth every 8 (eight) hours as needed for nausea or vomiting. 12 tablet 0  . ondansetron (ZOFRAN) 8 MG tablet Take 1 tablet (8 mg total) by mouth every 8 (eight) hours as needed for nausea or vomiting. 20 tablet 0  . oxyCODONE (OXY IR/ROXICODONE) 5 MG immediate release tablet Take 1 tablet (5 mg total) by mouth every 6 (six) hours as needed for severe pain. 30 tablet 0  . prochlorperazine (COMPAZINE) 10 MG tablet Take 1 tablet (10 mg total) by mouth every 6 (six) hours as needed for nausea or vomiting. 30 tablet 0  . TRELEGY ELLIPTA 100-62.5-25 MCG/INH AEPB INHALE 1 PUFF  BY MOUTH TWICE DAILY . APPOINTMENT REQUIRED FOR FUTURE REFILLS 60 each 0  . Vitamin D, Ergocalciferol, (DRISDOL) 1.25 MG (50000 UT) CAPS capsule Take 1 capsule by mouth once a week 12 capsule 0   No current facility-administered medications on file prior to visit.    BP (!) 142/86 (BP Location: Left  Arm, Patient Position: Sitting, Cuff Size: Small)   Pulse (!) 110   Temp 98.8 F (37.1 C) (Oral)   Resp 16   Ht 5\' 2"  (1.575 m)   Wt 72 lb 6.4 oz (32.8 kg)   SpO2 (!) 71%   BMI 13.24 kg/m    Objective:   Physical Exam Constitutional:      General: She  is not in acute distress.    Appearance: She is cachectic. She is ill-appearing.     Interventions: Nasal cannula in place.  Chest:     Comments: + upper airway rales noted No significant increased WOB Neurological:     Mental Status: She is alert.           Assessment & Plan:  Pneumonia- initial oxygen was 71 % on room air.  Pt was then placed on oxygen 2L [place], came up to 93%. Report was given to ER physician at the [facility] ED.  Pt was wheeled down to the ED in a wheelchair on oxygen 2 liters for further evaluation.    Wt Readings from Last 3 Encounters:  09/22/20 72 lb 6.4 oz (32.8 kg)  08/18/20 75 lb 1.6 oz (34.1 kg)  06/16/20 74 lb 0.6 oz (33.6 kg)   This visit occurred during the SARS-CoV-2 public health emergency.  Safety protocols were in place, including screening questions prior to the visit, additional usage of staff PPE, and extensive cleaning of exam room while observing appropriate contact time as indicated for disinfecting solutions.

## 2020-09-22 NOTE — ED Notes (Signed)
Patient transported to CT 

## 2020-09-22 NOTE — ED Provider Notes (Signed)
Emergency Department Provider Note   I have reviewed the triage vital signs and the nursing notes.   HISTORY  Chief Complaint Shortness of Breath   HPI Donna Curry is a 65 y.o. female patient with past medical history reviewed below including active bladder cancer on chemotherapy presents to the emergency department with hypoxemia with a diagnosis of pneumonia on 2/21.  Patient was seen during a virtual visit on 2/21.  Chest x-ray at that time showed pneumonia which went along with a cough which is worse at night.  She was started on a Z-Pak and notes that overall she is feeling okay.  She has not noticed specific worsening symptoms, shortness of breath, active chest pain.  She has not noticed fevers or shaking chills.  Denies body aches.  No sick contacts.  She had a follow-up appointment today and on arrival to the office found that her oxygen saturation was 71%.  He was started on 2 L nasal cannula and transported to the emergency department.  She does have a history of smoking but has never required oxygen.  She is not experiencing abdominal pain, vomiting, diarrhea. She has not received her COVID immunization.   In terms of treatment for her bladder cancer she is currently on Pembrolizumab q 6 wk.    Past Medical History:  Diagnosis Date  . Anemia   . Asthma   . Dyspnea    with exertion   . Family history of adverse reaction to anesthesia    brother wakes up and dont know who he is and sister has n/v  . Goals of care, counseling/discussion 02/09/2018  . History of blood transfusion   . Hypertension   . Iron deficiency anemia due to chronic blood loss 03/01/2018  . Measles as a child  . Mumps as a child  . TIA (transient ischemic attack)   . Tobacco abuse     Patient Active Problem List   Diagnosis Date Noted  . Hypoxia 09/22/2020  . Protein-calorie malnutrition, severe 09/03/2018  . Acute respiratory distress 08/31/2018  . Acute respiratory failure with hypoxia  ([place]) 08/31/2018  . HCAP (healthcare-associated pneumonia) 08/06/2018  . Emphysema of lung ([place]) 08/06/2018  . Acute on chronic respiratory failure with hypoxia ([place]) 08/06/2018  . Iron deficiency anemia due to chronic blood loss 03/01/2018  . Goals of care, counseling/discussion 02/09/2018  . Bladder cancer ([place]) 12/13/2017  . Hypertensive cardiomyopathy, 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[place]) 04/29/2016  . Hypokalemia 04/02/2016  . Protein-energy malnutrition ([place]) 04/02/2016  . Hypertensive emergency 04/01/2016  . Dizziness and giddiness 04/12/2013  . Palpitations 03/27/2013  . Weight loss 06/08/2012  . Degenerative disc disease, lumbar 01/30/2012  . Adrenal mass ([place]) 01/30/2012  . Hypercalcemia 01/30/2012  . Hyperproteinemia 01/30/2012  . Borderline diabetes 01/30/2012  . Normocytic anemia 03/11/2011  . TOBACCO ABUSE 08/31/2009  . Essential hypertension 08/31/2009    Past Surgical History:  Procedure Laterality Date  . CYSTOSCOPY W/ URETERAL STENT PLACEMENT Right 12/13/2017   Procedure: CYSTOSCOPY WITH RIGHT RETROGRADE PYELOGRAM/URETERAL RIGHT STENT PLACEMENT;  Surgeon: [name], MD;  Location: [facility];  Service: Urology;  Laterality: Right;  . IR IMAGING GUIDED PORT INSERTION  02/23/2018  . TRANSURETHRAL RESECTION OF BLADDER TUMOR N/A 12/13/2017   Procedure: TRANSURETHRAL RESECTION OF BLADDER TUMOR (TURBT);  Surgeon: [name], MD;  Location: [facility];  Service: Urology;  Laterality: N/A;  . TRANSURETHRAL RESECTION OF BLADDER TUMOR N/A 10/03/2018   Procedure: TRANSURETHRAL RESECTION OF BLADDER TUMOR (  TURBT);  Surgeon: [name], MD;  Location[facility];  Service: Urology;  Laterality: N/A;  . TRANSURETHRAL RESECTION OF BLADDER TUMOR WITH GYRUS (TURBT-GYRUS)     [name] 12-13-17  . tubes tied    . uterine ablation     about 2005    Allergies Patient has no known allergies.  Family History  Problem Relation Age of Onset  . Hypertension Mother   . Diabetes  Father   . Hypertension Brother   . HIV Brother     Social History Social History   Tobacco Use  . Smoking status: Current Every Day Smoker    Packs/day: 0.25    Types: Cigarettes  . Smokeless tobacco: Never Used  . Tobacco comment: smoking 3 cigarettes a day  Vaping Use  . Vaping Use: Former  Substance Use Topics  . Alcohol use: No    Alcohol/week: 0.0 standard drinks  . Drug use: Never    Review of Systems  Constitutional: No fever/chills Eyes: No visual changes. ENT: No sore throat. Cardiovascular: Denies chest pain. Respiratory: Denies shortness of breath. Positive continued cough.  Gastrointestinal: No abdominal pain.  No nausea, no vomiting.  No diarrhea.  No constipation. Genitourinary: Negative for dysuria. Musculoskeletal: Negative for back pain. Skin: Negative for rash. Neurological: Negative for headaches, focal weakness or numbness.  10-point ROS otherwise negative.  ____________________________________________   PHYSICAL EXAM:  VITAL SIGNS: ED Triage Vitals  Enc Vitals Group     BP 09/22/20 1507 126/80     Pulse Rate 09/22/20 1507 (!) 105     Resp 09/22/20 1507 18     Temp 09/22/20 1507 98.2 F (36.8 C)     Temp Source 09/22/20 1507 Oral     SpO2 09/22/20 1507 93 %     Weight 09/22/20 1513 74 lb (33.6 kg)     Height 09/22/20 1513 5\' 5"  (1.651 m)   Constitutional: Alert and oriented. Well appearing and in no acute distress. Eyes: Conjunctivae are normal. Head: Atraumatic. Nose: No congestion/rhinnorhea. Mouth/Throat: Mucous membranes are moist.   Neck: No stridor.  Cardiovascular: Tachycardia. Good peripheral circulation. Grossly normal heart sounds.  Port over the right anterior chest is well-appearing.  Respiratory: Normal respiratory effort.  No retractions. Lungs CTAB. Gastrointestinal: Soft and nontender. No distention.  Musculoskeletal: No lower extremity tenderness nor edema. No gross deformities of extremities. Neurologic:  Normal  speech and language.  Skin:  Skin is warm, dry and intact. No rash noted.  ____________________________________________   LABS (all labs ordered are listed, but only abnormal results are displayed)  Labs Reviewed  COMPREHENSIVE METABOLIC PANEL - Abnormal; Notable for the following components:      Result Value   Total Protein 8.3 (*)    Total Bilirubin 0.2 (*)    All other components within normal limits  TROPONIN I (HIGH SENSITIVITY) - Abnormal; Notable for the following components:   Troponin I (High Sensitivity) 43 (*)    All other components within normal limits  TROPONIN I (HIGH SENSITIVITY) - Abnormal; Notable for the following components:   Troponin I (High Sensitivity) 42 (*)    All other components within normal limits  RESP PANEL BY RT-PCR (FLU A&B, COVID) ARPGX2  CULTURE, BLOOD (ROUTINE X 2)  CULTURE, BLOOD (ROUTINE X 2)  LACTIC ACID, PLASMA  LACTIC ACID, PLASMA  CBC WITH DIFFERENTIAL/PLATELET   ____________________________________________  EKG  Rate: 101 PR: 110 QTc: 452  Sinus rhythm. Narrow QRS. No ST elevation or depression. Nonspecific  t waves. NO STEMI  ____________________________________________  RADIOLOGY  CT Angio Chest PE W and/or Wo Contrast  Result Date: 09/22/2020 CLINICAL DATA:  Pneumonia, hypoxia. EXAM: CT ANGIOGRAPHY CHEST WITH CONTRAST TECHNIQUE: Multidetector CT imaging of the chest was performed using the standard protocol during bolus administration of intravenous contrast. Multiplanar CT image reconstructions and MIPs were obtained to evaluate the vascular anatomy. CONTRAST:  26mL OMNIPAQUE IOHEXOL 350 MG/ML SOLN COMPARISON:  09/22/2020 chest radiograph and chest CT from 08/18/2020 FINDINGS: Cardiovascular: No filling defect is identified in the pulmonary arterial tree to suggest pulmonary embolus. Coronary, aortic arch, and branch vessel atherosclerotic vascular disease. Mediastinum/Nodes: Unremarkable Lungs/Pleura: Severe emphysema. New  peripheral airspace opacity anteriorly in the right lower lobe on image 89 series 5. Stable chronic scarring or atelectasis along the posterior basal segments of both lower lobes. Stable scarring in the left upper lobe. Upper Abdomen: Low-density fullness of both adrenal glands, unchanged. Musculoskeletal: Unremarkable Review of the MIP images confirms the above findings. IMPRESSION: 1. No filling defect is identified in the pulmonary arterial tree to suggest pulmonary embolus. 2. New peripheral airspace opacity anteriorly in the right lower lobe, favoring pneumonia. Similar smaller region of new airspace opacity in the lower lingula. 3. Coronary, aortic arch, and branch vessel atherosclerotic vascular disease. 4. Stable chronic scarring or atelectasis along the posterior basal segments of both lower lobes and left upper lobe. 5. Low-density fullness of both adrenal glands, unchanged, likely benign. 6. Emphysema and aortic atherosclerosis. Aortic Atherosclerosis (ICD10-I70.0) and Emphysema (ICD10-J43.9). Electronically Signed   By: Van Clines M.D.   On: 09/22/2020 17:21   DG Chest Portable 1 View  Result Date: 09/22/2020 CLINICAL DATA:  Shortness of breath, hypoxemia EXAM: PORTABLE CHEST 1 VIEW COMPARISON:  09/14/2020, CT 08/19/2020, 05/13/2019 FINDINGS: Hyperinflation with emphysematous disease. Right-sided central venous port tip over the SVC. Clearing of right CP angle focal airspace disease. Chronic pleural blunting. Stable cardiomediastinal silhouette with aortic atherosclerosis. No pneumothorax. IMPRESSION: Chronic hyperinflation and emphysematous disease. Improved aeration at the right base since prior. Electronically Signed   By: Donavan Foil M.D.   On: 09/22/2020 16:25    ____________________________________________   PROCEDURES  Procedure(s) performed:   .Critical Care Performed by: Margette Fast, MD Authorized by: Margette Fast, MD   Critical care provider statement:     Critical care time (minutes):  35   Critical care time was exclusive of:  Separately billable procedures and treating other patients and teaching time   Critical care was necessary to treat or prevent imminent or life-threatening deterioration of the following conditions:  Respiratory failure   Critical care was time spent personally by me on the following activities:  Discussions with consultants, evaluation of patient's response to treatment, examination of patient, ordering and performing treatments and interventions, ordering and review of laboratory studies, ordering and review of radiographic studies, pulse oximetry, re-evaluation of patient's condition, obtaining history from patient or surrogate, review of old charts, blood draw for specimens and development of treatment plan with patient or surrogate   I assumed direction of critical care for this patient from another provider in my specialty: no     Care discussed with: admitting provider       ____________________________________________   INITIAL IMPRESSION / [place] / ED COURSE  Pertinent labs & imaging results that were available during my care of the patient were reviewed by me and considered in my medical decision making (see chart for details).   Presents emergency department with hypoxemia  at her PCP during a follow-up for recent pneumonia diagnosis.  She completed a Z-Pak but arrived to the office hypoxemic at 71%.  I reviewed the x-ray from the 21st.  Patient is ultimately asymptomatic here.  She is not vaccinated against Covid.  She is being treated for bladder cancer currently.  Differential includes community-acquired pneumonia, PE, Covid pneumonia. EKG interpreted by me as above.   Lab work reviewed and overall reassuring.  Mild troponin elevation noted.  But no change on delta.  CT imaging shows changes consistent with pneumonia.  No visualized PE.  Will give Rocephin and azithromycin IV and admit with patient  continuing to have oxygen requirement.  No significant increased work of breathing.  Updated patient with plan who is in agreement with admit.   Discussed patient's case with [facility] to request admission. Patient and family (if present) updated with plan. Care transferred to [facility] service.  I reviewed all nursing notes, vitals, pertinent old records, EKGs, labs, imaging (as available).  ____________________________________________  FINAL CLINICAL IMPRESSION(S) / ED DIAGNOSES  Final diagnoses:  Acute respiratory failure with hypoxia ([place])  Community acquired pneumonia, unspecified laterality     MEDICATIONS GIVEN DURING THIS VISIT:  Medications  azithromycin (ZITHROMAX) 500 mg in sodium chloride 0.9 % 250 mL IVPB (500 mg Intravenous New Bag/Given 09/22/20 1900)  0.9 %  sodium chloride infusion ( Intravenous New Bag/Given 09/22/20 1818)  sodium chloride 0.9 % bolus 500 mL (0 mLs Intravenous Stopped 09/22/20 1857)  iohexol (OMNIPAQUE) 350 MG/ML injection 100 mL (60 mLs Intravenous Contrast Given 09/22/20 1656)  cefTRIAXone (ROCEPHIN) 1 g in sodium chloride 0.9 % 100 mL IVPB (0 g Intravenous Stopped 09/22/20 1858)    Note:  This document was prepared using Dragon voice recognition software and may include unintentional dictation errors.  Nanda Quinton, MD, [facility] Emergency Medicine    Marinus Eicher, [facility], MD 09/22/20 [phone]
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
## 2020-09-22 NOTE — ED Notes (Signed)
Pt SpO2 noted to be 80%, entered pt room to find nasal cannula lying in bed. Pt was unaware that it came out of her nose. Placed back on O2 and instructed to deep breathe. Placed on 3L [name] as SpO2 was taking a while to recover to normal level

## 2020-09-22 NOTE — Progress Notes (Signed)
PHARMACY NOTE:  ANTIMICROBIAL RENAL DOSAGE ADJUSTMENT  Current antimicrobial regimen includes a mismatch between antimicrobial dosage and estimated renal function.  As per policy approved by the Pharmacy & Therapeutics and Medical Executive Committees, the antimicrobial dosage will be adjusted accordingly.  Current antimicrobial dosage:  Levofloxacin 750mg  IV q24h x 5 days  Indication: COPD exacerbation  Renal Function:  Estimated Creatinine Clearance: 37.7 mL/min (by C-G formula based on SCr of 0.79 mg/dL).     Antimicrobial dosage has been changed to:  Levofloxacin 750mg  IV q48h x 5 days   Thank you for allowing pharmacy to be a part of this patient's care.  Everette Rank, [facility] 09/22/2020 10:06 PM

## 2020-09-23 ENCOUNTER — Encounter (HOSPITAL_COMMUNITY): Payer: Self-pay | Admitting: Internal Medicine

## 2020-09-23 DIAGNOSIS — F17200 Nicotine dependence, unspecified, uncomplicated: Secondary | ICD-10-CM

## 2020-09-23 DIAGNOSIS — E876 Hypokalemia: Secondary | ICD-10-CM

## 2020-09-23 DIAGNOSIS — C671 Malignant neoplasm of dome of bladder: Secondary | ICD-10-CM

## 2020-09-23 DIAGNOSIS — J441 Chronic obstructive pulmonary disease with (acute) exacerbation: Secondary | ICD-10-CM | POA: Diagnosis present

## 2020-09-23 DIAGNOSIS — J189 Pneumonia, unspecified organism: Secondary | ICD-10-CM | POA: Diagnosis present

## 2020-09-23 DIAGNOSIS — J9621 Acute and chronic respiratory failure with hypoxia: Secondary | ICD-10-CM

## 2020-09-23 DIAGNOSIS — I10 Essential (primary) hypertension: Secondary | ICD-10-CM

## 2020-09-23 LAB — COMPREHENSIVE METABOLIC PANEL
ALT: 18 U/L (ref 0–44)
AST: 20 U/L (ref 15–41)
Albumin: 3.3 g/dL — ABNORMAL LOW (ref 3.5–5.0)
Alkaline Phosphatase: 87 U/L (ref 38–126)
Anion gap: 8 (ref 5–15)
BUN: 12 mg/dL (ref 8–23)
CO2: 28 mmol/L (ref 22–32)
Calcium: 8.9 mg/dL (ref 8.9–10.3)
Chloride: 104 mmol/L (ref 98–111)
Creatinine, Ser: 0.69 mg/dL (ref 0.44–1.00)
GFR, Estimated: >60 mL/min (ref >60)
Glucose, Bld: 136 mg/dL — ABNORMAL HIGH (ref 70–99)
Potassium: 4.6 mmol/L (ref 3.5–5.1)
Sodium: 140 mmol/L (ref 135–145)
Total Bilirubin: 0.5 mg/dL (ref 0.3–1.2)
Total Protein: 7.4 g/dL (ref 6.5–8.1)

## 2020-09-23 LAB — CBC WITH DIFFERENTIAL/PLATELET
Abs Immature Granulocytes: 0.02 10*3/uL (ref 0.00–0.07)
Basophils Absolute: 0 10*3/uL (ref 0.0–0.1)
Basophils Relative: 0 %
Eosinophils Absolute: 0 10*3/uL (ref 0.0–0.5)
Eosinophils Relative: 0 %
HCT: 39.9 % (ref 36.0–46.0)
Hemoglobin: 12.3 g/dL (ref 12.0–15.0)
Immature Granulocytes: 0 %
Lymphocytes Relative: 8 %
Lymphs Abs: 0.3 10*3/uL — ABNORMAL LOW (ref 0.7–4.0)
MCH: 31.9 pg (ref 26.0–34.0)
MCHC: 30.8 g/dL (ref 30.0–36.0)
MCV: 103.4 fL — ABNORMAL HIGH (ref 80.0–100.0)
Monocytes Absolute: 0.1 10*3/uL (ref 0.1–1.0)
Monocytes Relative: 1 %
Neutro Abs: 4.1 10*3/uL (ref 1.7–7.7)
Neutrophils Relative %: 91 %
Platelets: 281 10*3/uL (ref 150–400)
RBC: 3.86 MIL/uL — ABNORMAL LOW (ref 3.87–5.11)
RDW: 15.2 % (ref 11.5–15.5)
WBC: 4.5 10*3/uL (ref 4.0–10.5)
nRBC: 0 % (ref 0.0–0.2)

## 2020-09-23 LAB — HIV ANTIBODY (ROUTINE TESTING W REFLEX): HIV Screen 4th Generation wRfx: NONREACTIVE

## 2020-09-23 MED ORDER — PANTOPRAZOLE SODIUM 40 MG PO TBEC
40.0000 mg | DELAYED_RELEASE_TABLET | Freq: Every day | ORAL | Status: DC
  Administered 2020-09-23 – 2020-09-24 (×2): 40 mg via ORAL
  Filled 2020-09-23: qty 1

## 2020-09-23 MED ORDER — BOOST / RESOURCE BREEZE PO LIQD CUSTOM
1.0000 | ORAL | Status: DC
  Administered 2020-09-24: 1 via ORAL

## 2020-09-23 MED ORDER — ORAL CARE MOUTH RINSE
15.0000 mL | Freq: Two times a day (BID) | OROMUCOSAL | Status: DC
  Administered 2020-09-23 – 2020-09-24 (×4): 15 mL via OROMUCOSAL

## 2020-09-23 MED ORDER — CHLORHEXIDINE GLUCONATE CLOTH 2 % EX PADS
6.0000 | MEDICATED_PAD | Freq: Every day | CUTANEOUS | Status: DC
  Administered 2020-09-23 – 2020-09-24 (×2): 6 via TOPICAL

## 2020-09-23 MED ORDER — ENOXAPARIN SODIUM 300 MG/3ML IJ SOLN
20.0000 mg | INTRAMUSCULAR | Status: DC
  Administered 2020-09-23: 20 mg via SUBCUTANEOUS
  Filled 2020-09-23 (×2): qty 0.2

## 2020-09-23 MED ORDER — ENSURE ENLIVE PO LIQD
237.0000 mL | Freq: Two times a day (BID) | ORAL | Status: DC
  Administered 2020-09-23 – 2020-09-24 (×3): 237 mL via ORAL

## 2020-09-23 MED ORDER — GUAIFENESIN ER 600 MG PO TB12
1200.0000 mg | ORAL_TABLET | Freq: Two times a day (BID) | ORAL | Status: DC
  Administered 2020-09-23 – 2020-09-24 (×3): 1200 mg via ORAL
  Filled 2020-09-23 (×3): qty 2

## 2020-09-23 MED ORDER — LORATADINE 10 MG PO TABS
10.0000 mg | ORAL_TABLET | Freq: Every day | ORAL | Status: DC
  Administered 2020-09-23 – 2020-09-24 (×2): 10 mg via ORAL
  Filled 2020-09-23 (×2): qty 1

## 2020-09-23 NOTE — Progress Notes (Signed)
Initial Nutrition Assessment  DOCUMENTATION CODES:   Severe malnutrition in context of chronic illness,Underweight  INTERVENTION:  - will order Boost Breeze once/day, each supplement provides 250 kcal and 9 grams of protein. - will order Ensure Enlive BID, each supplement provides 350 kcal and 20 grams of protein.   NUTRITION DIAGNOSIS:   Severe Malnutrition related to chronic illness (COPD) as evidenced by severe fat depletion,severe muscle depletion.  GOAL:   Patient will meet greater than or equal to 90% of their needs  MONITOR:   PO intake,Supplement acceptance,Labs,Weight trends  REASON FOR ASSESSMENT:   Consult Assessment of nutrition requirement/status  ASSESSMENT:   65 y.o. female with medical history of bladder cancer, asthma with COPD who continues to smoke cigarettes, HTN, and TIA. She has been battling pneumonia since last week. She continues to be SOB and have a cough.  No intakes documented since admission. At the time of RD visit this AM, she reported having eaten nearly 100% of breakfast which consisted of toast, grits, and scrambled eggs.  Patient reports that she has a good appetite at baseline and currently and that she always eats well. Despite several attempts to obtain more details on this, unable to determine how many meals and/or snacks she eats/day and what those times of eating consist of.   Patient has been taking marinol BID for ~6 months and feels that overall it has been beneficial although she has not noticed a change in weight.  Review of weight recordings from the past 12 months indicate that weight has been stable.   She drinks Boost (or similar) protein shakes at home, usually BID and is very interested in receiving them during hospitalization.   She has no teeth but does have dentures that fit well. She forgot to bring them to the hospital so has been selecting foods she knows she can chew well without her dentures.   Her sister lives  with her and they share the responsibility of cooking and taking care of the home.   Patient mentions multiple times that she has never been much bigger than she currently is except when she was pregnant with her daughter, who is now 3 years old.   Patient is interested in gaining weight and is planning to purchase a nutrition supplement she saw on a tv commercial that promotes weight gain. She is unsure of the name of this supplement.    Labs reviewed. Medications reviewed; PRN marinol, 20 mg megace/day, 40 mg deltasone/day, 40 mg oral protonix/day.  IVF; 1/2 NS @ 100 ml/hr.     NUTRITION - FOCUSED PHYSICAL EXAM:  Flowsheet Row Most Recent Value  Orbital Region Moderate depletion  Upper Arm Region Severe depletion  Thoracic and Lumbar Region Severe depletion  Buccal Region Severe depletion  Temple Region Moderate depletion  Clavicle Bone Region Severe depletion  Clavicle and Acromion Bone Region Severe depletion  Scapular Bone Region Severe depletion  Dorsal Hand Severe depletion  Patellar Region Severe depletion  Anterior Thigh Region Severe depletion  Posterior Calf Region Severe depletion  Edema (RD Assessment) None  Hair Reviewed  Eyes Reviewed  Mouth Reviewed  [no teeth]  Skin Reviewed  Nails Reviewed       Diet Order:   Diet Order            Diet Heart Room service appropriate? Yes; Fluid consistency: Thin  Diet effective now                 EDUCATION NEEDS:  Education needs have been addressed  Skin:  Skin Assessment: Reviewed RN Assessment  Last BM:  3/2 (type 5 x1)  Height:   Ht Readings from Last 1 Encounters:  09/22/20 5\' 5"  (1.651 m)    Weight:   Wt Readings from Last 1 Encounters:  09/22/20 33.6 kg     Estimated Nutritional Needs:  Kcal:  1275-1500 kcal Protein:  70-85 grams Fluid:  >/= 1.6 L/day     Jarome Matin, MS, RD, LDN, CNSC Inpatient Clinical Dietitian RD pager # available in [facility]  After hours/weekend pager #  available in [facility]

## 2020-09-23 NOTE — Evaluation (Signed)
Physical Therapy Evaluation Patient Details Name: Donna Curry MRN: [id number] DOB: [birth year] Today's Date: 09/23/2020   History of Present Illness  Donna Curry is a 65 y.o. female with medical history significant of bladder cancer, asthma with COPD who continues to smoke cigarette, hypertension, history of TIA, who has been battling pneumonia since last week.  Patient apparently has gone through 2 rounds of antibiotics but continues to be hypoxic.  She continues to have shortness of breath and cough.  Clinical Impression  Patient ambulated using RW x 60' on 2 L [place], SPO2 91%, HR 106. Patient plans Dc  Home with family support. Pt admitted with above diagnosis.  Pt currently with functional limitations due to the deficits listed below (see PT Problem List). Pt will benefit from skilled PT to increase their independence and safety with mobility to allow discharge to the venue listed below.        Follow Up Recommendations No PT follow up    Equipment Recommendations  None recommended by PT (pt wants a scooter)    Recommendations for Other Services       Precautions / Restrictions Precautions Precautions: Other (comment) Precaution Comments: monitor o2 sats Restrictions Weight Bearing Restrictions: No      Mobility  Bed Mobility Overal bed mobility: Modified Independent                  Transfers Overall transfer level: Modified independent               General transfer comment: use of RW  Ambulation/Gait Ambulation/Gait assistance: Min guard Gait Distance (Feet): 120 Feet Assistive device: Rolling walker (2 wheeled) Gait Pattern/deviations: Step-through pattern;Trunk flexed Gait velocity: decr   General Gait Details: trunk tends to be forward flexed, Rw ahead.  Stairs            Wheelchair Mobility    Modified Rankin (Stroke Patients Only)       Balance Overall balance assessment: Mild deficits observed, not formally tested                                            Pertinent Vitals/Pain Pain Assessment: No/denies pain    Home Living Family/patient expects to be discharged to:: Private residence Living Arrangements: Other relatives Available Help at Discharge: Family Type of Home: House Home Access: Stairs to enter   Technical brewer of Steps: 1 Home Layout: One level Home Equipment: Environmental consultant - 2 wheels      Prior Function Level of Independence: Independent with assistive device(s)               Hand Dominance        Extremity/Trunk Assessment   Upper Extremity Assessment Upper Extremity Assessment: Overall WFL for tasks assessed    Lower Extremity Assessment Lower Extremity Assessment: Overall WFL for tasks assessed    Cervical / Trunk Assessment Cervical / Trunk Assessment: Kyphotic  Communication   Communication: No difficulties  Cognition Arousal/Alertness: Awake/alert Behavior During Therapy: WFL for tasks assessed/performed Overall Cognitive Status: Within Functional Limits for tasks assessed                                        General Comments      Exercises     Assessment/Plan  PT Assessment Patient needs continued PT services  PT Problem List Decreased strength;Decreased safety awareness;Decreased activity tolerance;Cardiopulmonary status limiting activity       PT Treatment Interventions DME instruction;Therapeutic activities;Gait training;Patient/family education;Functional mobility training    PT Goals (Current goals can be found in the Care Plan section)  Acute Rehab PT Goals Patient Stated Goal: go home PT Goal Formulation: With patient/family Time For Goal Achievement: 10/07/20 Potential to Achieve Goals: Good    Frequency Min 3X/week   Barriers to discharge        Co-evaluation               AM-PAC PT "6 Clicks" Mobility  Outcome Measure Help needed turning from your back to your side while in a flat  bed without using bedrails?: None Help needed moving from lying on your back to sitting on the side of a flat bed without using bedrails?: None Help needed moving to and from a bed to a chair (including a wheelchair)?: None Help needed standing up from a chair using your arms (e.g., wheelchair or bedside chair)?: A Little Help needed to walk in hospital room?: A Little Help needed climbing 3-5 steps with a railing? : A Little 6 Click Score: 21    End of Session Equipment Utilized During Treatment: Oxygen Activity Tolerance: Patient tolerated treatment well Patient left: in bed;with call bell/phone within reach;with chair alarm set Nurse Communication: Mobility status PT Visit Diagnosis: Unsteadiness on feet (R26.81);Difficulty in walking, not elsewhere classified (R26.2)    Time: 1410-1431 PT Time Calculation (min) (ACUTE ONLY): 21 min   Charges:   PT Evaluation $PT Eval Low Complexity: [place] PT Acute Rehabilitation Services Pager [phone] Office [phone]   Claretha Cooper 09/23/2020, 3:24 PM

## 2020-09-23 NOTE — Progress Notes (Signed)
Airborne/Contact PPE discontinued, COVID negative. SRP, RN

## 2020-09-23 NOTE — Progress Notes (Signed)
PROGRESS NOTE    Donna Curry  KKX:[id number] DOB: 09-Nov-1955 DOA: 09/22/2020 PCP: [name], NP    Chief Complaint  Patient presents with  . Shortness of Breath    Brief Narrative:  Patient 65 year old female history of bladder cancer, asthma, COPD with ongoing tobacco use, hypertension, history of TIA who noted to have had a pneumonia for about a week and undergone 2 rounds of antibiotics but continued to be hypoxic.  Patient presented with shortness of breath on exertion and cough.  Patient noted to be hypoxic on room air requiring 2 L oxygen.  Patient with no established diagnosis of COPD however on inhaled steroids.  Patient seen in the ED noted to have sats of 71% on room air, COVID-19 PCR negative, chest x-ray with chronic hyperinflation emphysema, CT angiogram chest negative for PE however new peripheral airspace opacity in the right lower lobe probably pneumonia and also in the right lingula.  Patient admitted for acute hypoxemia felt secondary to COPD exacerbation and recurrent pneumonia.   Assessment & Plan:   Principal Problem:   Acute on chronic respiratory failure with hypoxia (HCC) Active Problems:   TOBACCO ABUSE   Essential hypertension   Hypokalemia   Hypertensive cardiomyopathy, 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HCC)   Bladder cancer (HCC)   1 acute on chronic respiratory failure with hypoxia secondary to recurrent pneumonia and probable acute COPD exacerbation Patient presented with hypoxia, shortness of breath on exertion, cough.  Chest x-ray done consistent with emphysematous changes.  CT angiogram chest negative for PE however concerning for new peripheral airspace opacity in the right lower lobe and in the right lingula. Improving clinically.  Continue IV Solu-Medrol taper to oral prednisone, Levaquin, scheduled duo nebs, will Ellipta, Incruse.  Place on Claritin, Mucinex, PPI.  Check ambulatory sats.  Will likely need to go home on home O2 with outpatient  follow-up with PCP.  May benefit from referral to pulmonary.  2.  Hypertension Stable.  Continue Norvasc.  3.  Hypokalemia Repleted.  4.  History of bladder cancer Outpatient follow-up with urology.  5.  History of tobacco abuse Tobacco cessation.   DVT prophylaxis: Lovenox Code Status: Full Family Communication: Updated patient.  No family at bedside. Disposition:   Status is: Inpatient    Dispo: The patient is from: Home              Anticipated d/c is to: Home hopefully 1 to 2 days              Patient currently on IV antibiotics, IV steroids been transitioned to oral steroids, not stable for discharge   Difficult to place patient no       Consultants:   None  Procedures:   CT angiogram chest 09/22/2020  Chest x-ray 09/22/2020  Antimicrobials:   IV Levaquin 09/23/2020>>>>> 09/29/2020   Subjective: Sitting up in chair.  States she feels shortness of breath on exertion improving since admission.  Feels much better than she did on admission.  No chest pain.  States she is eating well and denies any choking or coughing episodes with oral intake.  Objective: Vitals:   09/23/20 0127 09/23/20 0226 09/23/20 0523 09/23/20 0756  BP: 121/90  116/82   Pulse: 90  (!) 101   Resp:   17   Temp: 98.3 F (36.8 C)  98.3 F (36.8 C)   TempSrc: Oral  Oral   SpO2: 100% 100% 96% 99%  Weight:      Height:  Intake/Output Summary (Last 24 hours) at 09/23/2020 1248 Last data filed at 09/23/2020 0600 Gross per 24 hour  Intake 1171.7 ml  Output 100 ml  Net 1071.7 ml   Filed Weights   09/22/20 1513  Weight: 33.6 kg    Examination:  General exam: Frail.  Cachectic.  Chronically ill-appearing. Respiratory system: Minimal expiratory wheezing.  No crackles.  No rhonchi.  Fair air movement.  Speaking in full sentences.  Cardiovascular system: S1 & S2 heard, RRR. No JVD, murmurs, rubs, gallops or clicks. No pedal edema. Gastrointestinal system: Abdomen is nondistended,  soft and nontender. No organomegaly or masses felt. Normal bowel sounds heard. Central nervous system: Alert and oriented. No focal neurological deficits. Extremities: Symmetric 5 x 5 power. Skin: No rashes, lesions or ulcers Psychiatry: Judgement and insight appear normal. Mood & affect appropriate.     Data Reviewed: I have personally reviewed following labs and imaging studies  CBC: Recent Labs  Lab 09/22/20 1545 09/22/20 2229 09/23/20 0352  WBC 6.0 4.7 4.5  NEUTROABS 4.2  --  4.1  HGB 13.0 12.7 12.3  HCT 40.6 40.1 39.9  MCV 99.5 102.3* 103.4*  PLT 293 274 354    Basic Metabolic Panel: Recent Labs  Lab 09/22/20 1545 09/22/20 2229 09/23/20 0352  NA 140  --  140  K 4.4  --  4.6  CL 99  --  104  CO2 31  --  28  GLUCOSE 88  --  136*  BUN 12  --  12  CREATININE 0.79 0.73 0.69  CALCIUM 9.2  --  8.9    GFR: Estimated Creatinine Clearance: 37.7 mL/min (by C-G formula based on SCr of 0.69 mg/dL).  Liver Function Tests: Recent Labs  Lab 09/22/20 1545 09/23/20 0352  AST 17 20  ALT 14 18  ALKPHOS 85 87  BILITOT 0.2* 0.5  PROT 8.3* 7.4  ALBUMIN 3.7 3.3*    CBG: No results for input(s): GLUCAP in the last 168 hours.   Recent Results (from the past 240 hour(s))  Culture, blood (routine x 2)     Status: None (Preliminary result)   Collection Time: 09/22/20  3:45 PM   Specimen: BLOOD RIGHT FOREARM  Result Value Ref Range Status   Specimen Description   Final    BLOOD RIGHT FOREARM Performed at [facility], [address] [place], [place] [address]    Special Requests   Final    BOTTLES DRAWN AEROBIC AND ANAEROBIC Blood Culture adequate volume Performed at [facility], [address] [place], [place] [address]    Culture   Final    NO GROWTH < 24 HOURS Performed at [facility], [place] [address] [place], [place] [address]    Report Status PENDING  Incomplete  Resp Panel by RT-PCR (Flu A&B, Covid) Peripheral     Status: None    Collection Time: 09/22/20  3:45 PM   Specimen: Peripheral; Nasopharyngeal(NP) swabs in vial transport medium  Result Value Ref Range Status   SARS Coronavirus 2 by RT PCR NEGATIVE NEGATIVE Final    Comment: (NOTE) SARS-CoV-2 target nucleic acids are NOT DETECTED.  The SARS-CoV-2 RNA is generally detectable in upper respiratory specimens during the acute phase of infection. The lowest concentration of SARS-CoV-2 viral copies this assay can detect is 138 copies/mL. A negative result does not preclude SARS-Cov-2 infection and should not be used as the sole basis for treatment or other patient management decisions. A negative  result may occur with  improper specimen collection/handling, submission of specimen other than nasopharyngeal swab, presence of viral mutation(s) within the areas targeted by this assay, and inadequate number of viral copies(<138 copies/mL). A negative result must be combined with clinical observations, patient history, and epidemiological information. The expected result is Negative.  Fact Sheet for Patients:  EntrepreneurPulse.com.au  Fact Sheet for Healthcare Providers:  IncredibleEmployment.be  This test is no t yet approved or cleared by the [place] FDA and  has been authorized for detection and/or diagnosis of SARS-CoV-2 by FDA under an Emergency Use Authorization (EUA). This EUA will remain  in effect (meaning this test can be used) for the duration of the COVID-19 declaration under Section 564(b)(1) of the Act, 21 U.S.C.section 360bbb-3(b)(1), unless the authorization is terminated  or revoked sooner.       Influenza A by PCR NEGATIVE NEGATIVE Final   Influenza B by PCR NEGATIVE NEGATIVE Final    Comment: (NOTE) The Xpert Xpress SARS-CoV-2/FLU/RSV plus assay is intended as an aid in the diagnosis of influenza from Nasopharyngeal swab specimens and should not be used as a sole basis for treatment. Nasal  washings and aspirates are unacceptable for Xpert Xpress SARS-CoV-2/FLU/RSV testing.  Fact Sheet for Patients: EntrepreneurPulse.com.au  Fact Sheet for Healthcare Providers: IncredibleEmployment.be  This test is not yet approved or cleared by the [place] FDA and has been authorized for detection and/or diagnosis of SARS-CoV-2 by FDA under an Emergency Use Authorization (EUA). This EUA will remain in effect (meaning this test can be used) for the duration of the COVID-19 declaration under Section 564(b)(1) of the Act, 21 U.S.C. section 360bbb-3(b)(1), unless the authorization is terminated or revoked.  Performed at [facility], [address] [place], [place] [address]   Culture, blood (routine x 2)     Status: None (Preliminary result)   Collection Time: 09/22/20  3:46 PM   Specimen: BLOOD  Result Value Ref Range Status   Specimen Description   Final    BLOOD LEFT ANTECUBITAL Performed at [facility], [address] [place], [place] [address]    Special Requests   Final    BOTTLES DRAWN AEROBIC AND ANAEROBIC Blood Culture results may not be optimal due to an inadequate volume of blood received in culture bottles Performed at [facility], [address] [place], [place] [address]    Culture   Final    NO GROWTH < 24 HOURS Performed at [facility], [place] [address], [place], [place] [address]    Report Status PENDING  Incomplete         Radiology Studies: CT Angio Chest PE W and/or Wo Contrast  Result Date: 09/22/2020 CLINICAL DATA:  Pneumonia, hypoxia. EXAM: CT ANGIOGRAPHY CHEST WITH CONTRAST TECHNIQUE: Multidetector CT imaging of the chest was performed using the standard protocol during bolus administration of intravenous contrast. Multiplanar CT image reconstructions and MIPs were obtained to evaluate the vascular anatomy. CONTRAST:  59mL OMNIPAQUE IOHEXOL 350 MG/ML SOLN COMPARISON:   09/22/2020 chest radiograph and chest CT from 08/18/2020 FINDINGS: Cardiovascular: No filling defect is identified in the pulmonary arterial tree to suggest pulmonary embolus. Coronary, aortic arch, and branch vessel atherosclerotic vascular disease. Mediastinum/Nodes: Unremarkable Lungs/Pleura: Severe emphysema. New peripheral airspace opacity anteriorly in the right lower lobe on image 89 series 5. Stable chronic scarring or atelectasis along the posterior basal segments of both lower lobes. Stable scarring in the left upper lobe. Upper  Abdomen: Low-density fullness of both adrenal glands, unchanged. Musculoskeletal: Unremarkable Review of the MIP images confirms the above findings. IMPRESSION: 1. No filling defect is identified in the pulmonary arterial tree to suggest pulmonary embolus. 2. New peripheral airspace opacity anteriorly in the right lower lobe, favoring pneumonia. Similar smaller region of new airspace opacity in the lower lingula. 3. Coronary, aortic arch, and branch vessel atherosclerotic vascular disease. 4. Stable chronic scarring or atelectasis along the posterior basal segments of both lower lobes and left upper lobe. 5. Low-density fullness of both adrenal glands, unchanged, likely benign. 6. Emphysema and aortic atherosclerosis. Aortic Atherosclerosis (ICD10-I70.0) and Emphysema (ICD10-J43.9). Electronically Signed   By: Van Clines M.D.   On: 09/22/2020 17:21   DG Chest Portable 1 View  Result Date: 09/22/2020 CLINICAL DATA:  Shortness of breath, hypoxemia EXAM: PORTABLE CHEST 1 VIEW COMPARISON:  09/14/2020, CT 08/19/2020, 05/13/2019 FINDINGS: Hyperinflation with emphysematous disease. Right-sided central venous port tip over the SVC. Clearing of right CP angle focal airspace disease. Chronic pleural blunting. Stable cardiomediastinal silhouette with aortic atherosclerosis. No pneumothorax. IMPRESSION: Chronic hyperinflation and emphysematous disease. Improved aeration at the right  base since prior. Electronically Signed   By: Donavan Foil M.D.   On: 09/22/2020 16:25        Scheduled Meds: . amLODipine  10 mg Oral Daily  . aspirin EC  81 mg Oral Daily  . Chlorhexidine Gluconate Cloth  6 each Topical Daily  . citalopram  10 mg Oral Daily  . enoxaparin (LOVENOX) injection  20 mg Subcutaneous Q24H  . fluticasone furoate-vilanterol  1 puff Inhalation Daily   And  . umeclidinium bromide  1 puff Inhalation Daily  . guaiFENesin  1,200 mg Oral BID  . ipratropium-albuterol  3 mL Nebulization Q6H  . loratadine  10 mg Oral Daily  . mouth rinse  15 mL Mouth Rinse BID  . megestrol  20 mg Oral Daily  . methylPREDNISolone (SOLU-MEDROL) injection  40 mg Intravenous Q6H   Followed by  . [START ON 09/24/2020] predniSONE  40 mg Oral Q breakfast  . pantoprazole  40 mg Oral Q0600   Continuous Infusions: . sodium chloride 100 mL/hr at 09/23/20 1035  . sodium chloride 10 mL/hr at 09/22/20 1818  . levofloxacin (LEVAQUIN) IV       LOS: 1 day    Time spent: 35 minutes    Irine Seal, MD [facility]   To contact the attending provider between 7A-7P or the covering provider during after hours 7P-7A, please log into the web site [email or web address] and access using universal [place] password for that web site. If you do not have the password, please call the hospital operator.  09/23/2020, 12:48 PM

## 2020-09-23 NOTE — Progress Notes (Signed)
SATURATION QUALIFICATIONS: (This note is used to comply with regulatory documentation for home oxygen)  Patient Saturations on Room Air at Rest 89%  Patient Saturations on Room Air while Ambulating 85%  Patient Saturations on 2 Liters of oxygen while Ambulating 93%  Please briefly explain why patient needs home oxygen: TO maintain oxygenations while completing basic ADLs and ambulating around in home when completing errands.   SRP, RN

## 2020-09-23 NOTE — Evaluation (Addendum)
Clinical/Bedside Swallow Evaluation Patient Details  Name: Donna Curry MRN: [id number] Date of Birth: 11-07-1955  Today's Date: 09/23/2020 Time: SLP Start Time (ACUTE ONLY): 2831 SLP Stop Time (ACUTE ONLY): 1249 SLP Time Calculation (min) (ACUTE ONLY): 34 min  Past Medical History:  Past Medical History:  Diagnosis Date  . Anemia   . Asthma   . Dyspnea    with exertion   . Family history of adverse reaction to anesthesia    brother wakes up and dont know who he is and sister has n/v  . Goals of care, counseling/discussion 02/09/2018  . History of blood transfusion   . Hypertension   . Iron deficiency anemia due to chronic blood loss 03/01/2018  . Measles as a child  . Mumps as a child  . TIA (transient ischemic attack)   . Tobacco abuse    Past Surgical History:  Past Surgical History:  Procedure Laterality Date  . CYSTOSCOPY W/ URETERAL STENT PLACEMENT Right 12/13/2017   Procedure: CYSTOSCOPY WITH RIGHT RETROGRADE PYELOGRAM/URETERAL RIGHT STENT PLACEMENT;  Surgeon: [name], MD;  Location: [facility];  Service: Urology;  Laterality: Right;  . IR IMAGING GUIDED PORT INSERTION  02/23/2018  . TRANSURETHRAL RESECTION OF BLADDER TUMOR N/A 12/13/2017   Procedure: TRANSURETHRAL RESECTION OF BLADDER TUMOR (TURBT);  Surgeon: [name], MD;  Location: [facility];  Service: Urology;  Laterality: N/A;  . TRANSURETHRAL RESECTION OF BLADDER TUMOR N/A 10/03/2018   Procedure: TRANSURETHRAL RESECTION OF BLADDER TUMOR (TURBT);  Surgeon: [name], MD;  Location: [facility];  Service: Urology;  Laterality: N/A;  . TRANSURETHRAL RESECTION OF BLADDER TUMOR WITH GYRUS (TURBT-GYRUS)     [name] 12-13-17  . tubes tied    . uterine ablation     about 2005   HPI:  "Donna Curry is a 65 y.o. female with medical history significant of bladder cancer, asthma with COPD who continues to smoke cigarette, hypertension, history of TIA, who has been battling pneumonia since last week.  Patient  apparently has gone through 2 rounds of antibiotics but continues to be hypoxic.  She continues to have shortness of breath and cough." per notes in chart.  Swallow eval ordered due to pt having pna - Pt denies dysphagia - admits to occasional issues with stomach hurting for which she takes anti nausea medication.  She self modifies eating behavior to prevent symptoms - for example - does not eat much dairy, puts small amount of sauce.   Assessment / Plan / Recommendation Clinical Impression  Pt with functional oropharyngeal swallow ability - no indication of aspiration.  Pt easily passed 3 ounce [place] and was observed consuming water, icecream and graham cracker.  Swallow appeared timely with clear voice throughout. No indications of oropharyngeal dysphagia- no multiple swallows, complaint of globus, etc.  Belch noted x1 - of which pt reported awareness later.    Pt denies any dysphagia nor GERD and RN admits no deficits observed.  Pt denies dysphagia - admits to occasional issues with stomach hurting for which she takes anti nausea medication.    She self modifies eating behavior to prevent symptoms - for example - does not eat much dairy, puts small amount of sauce on pasta, etc.  SlP advised her to start po with liquids due to her being on oxygen and dry air, consume small frequent meals, avoid eating late and stay upright after meals.  She is currently taking a PPI - advised her to  speak to md re: if she needs to continue its use.   Pt reports she takes prescribed Oxycontin at home prn for severe arthritis.  She states took it 3 days prior to admit but denies lethargy, reflux, etc associated with its use.    Provided pt with reflux precautions given she is currently on a PPI. Advised she also cease smoking as smoke can cause relaxation of sphincter musculature allowing reflux.    All education completed, no f/u.  Thanks.   SLP Visit Diagnosis: Dysphagia, unspecified (R13.10)    Aspiration  Risk  Mild aspiration risk    Diet Recommendation Regular;Thin liquid   Medication Administration: Whole meds with liquid Supervision: Patient able to self feed Compensations: Slow rate;Small sips/bites Postural Changes: Seated upright at 90 degrees;Remain upright for at least 30 minutes after po intake    Other  Recommendations Oral Care Recommendations: Oral care BID   Follow up Recommendations None             Prognosis   n/a     Swallow Study   General Date of Onset: 09/23/20 HPI: "Donna Curry is a 65 y.o. female with medical history significant of bladder cancer, asthma with COPD who continues to smoke cigarette, hypertension, history of TIA, who has been battling pneumonia since last week.  Patient apparently has gone through 2 rounds of antibiotics but continues to be hypoxic.  She continues to have shortness of breath and cough." per notes in chart.  Swallow eval ordered due to pt having pna - Pt denies dysphagia - admits to occasional issues with stomach hurting for which she takes anti nausea medication.  She self modifies eating behavior to prevent symptoms - for example - does not eat much dairy, puts small amount of sauce. Type of Study: Bedside Swallow Evaluation Diet Prior to this Study: Regular;Thin liquids Temperature Spikes Noted: No Respiratory Status: Nasal cannula History of Recent Intubation: No Behavior/Cognition: Alert;Cooperative;Pleasant mood Oral Cavity Assessment: Within Functional Limits Oral Care Completed by SLP: No Oral Cavity - Dentition: Edentulous;Other (Comment) (pt has partials at home but only uses them for going out) Vision: Functional for self-feeding Self-Feeding Abilities: Able to feed self Patient Positioning: Upright in chair Baseline Vocal Quality: Normal Volitional Cough: Strong Volitional Swallow: Able to elicit    Oral/Motor/Sensory Function Overall Oral Motor/Sensory Function: Within functional limits   Ice Chips Ice  chips: Not tested   Thin Liquid Thin Liquid: Within functional limits Presentation: Cup    Nectar Thick Nectar Thick Liquid: Not tested   Honey Thick Honey Thick Liquid: Not tested   Puree Puree: Within functional limits Presentation: Self Fed   Solid     Solid: Within functional limits Presentation: Self Fredirick Lathe 09/23/2020,1:11 PM  Kathleen Lime, MS [facility] SLP [place] Office [phone] Pager [phone]

## 2020-09-23 NOTE — Progress Notes (Signed)
Pt alert and aware sitting on edge of bed. Pt states she is doing good and ready to go home. She told me that she belongs to [employer]. No needs at present. The chaplain offered caring presence and listening ear.

## 2020-09-23 NOTE — Progress Notes (Addendum)
SATURATION QUALIFICATIONS: (This note is used to comply with regulatory documentation for home oxygen)  Patient Saturations on Room Air at Rest = N/A%  Patient Saturations on Room Air while Ambulating = N/A%  Patient Saturations on 2 Liters of oxygen while Ambulating = 89-97%  Please briefly explain why patient needs home oxygen:  Done per order to check pulse oximetry while ambulating

## 2020-09-23 NOTE — Evaluation (Signed)
Occupational Therapy Evaluation Patient Details Name: Donna Curry MRN: [id number] DOB: [birth year] Today's Date: 09/23/2020    History of Present Illness Donna Curry is a 65 y.o. female with medical history significant of bladder cancer, asthma with COPD who continues to smoke cigarette, hypertension, history of TIA, who has been battling pneumonia since last week.  Patient apparently has gone through 2 rounds of antibiotics but continues to be hypoxic.  She continues to have shortness of breath and cough.   Clinical Impression   Donna Curry is a 65 year old woman with above medical history who presents on 2 L [place] but without complaints of dyspnea. On evaluation patient demonstrates ability to perform transfers, mobility with RW and ADLs on 2 L. Patient ambulated around the bed with RW and o2 sat 70% when she got to recliner. Pleth signal good but patient without any symptoms. Increased to 6 L to recover into 90s. Attempted another ambulation - approx half the distance - and o2 sat 87% and recovered back to high 90s. Rn notified. Patient at her baseline in regards to functional mobility and self care tasks, reports having assistance of sister and daughter at home and has needed DME. No OT needs at this time.    Follow Up Recommendations  No OT follow up    Equipment Recommendations  None recommended by OT    Recommendations for Other Services       Precautions / Restrictions Precautions Precautions: Other (comment) Precaution Comments: monitor o2 sats Restrictions Weight Bearing Restrictions: No      Mobility Bed Mobility Overal bed mobility: Modified Independent                  Transfers Overall transfer level: Modified independent               General transfer comment: use of RW to ambulate in room.    Balance Overall balance assessment: Mild deficits observed, not formally tested                                         ADL  either performed or assessed with clinical judgement   ADL Overall ADL's : At baseline                                             Vision   Vision Assessment?: No apparent visual deficits     Perception     Praxis      Pertinent Vitals/Pain Pain Assessment: No/denies pain     Hand Dominance     Extremity/Trunk Assessment Upper Extremity Assessment Upper Extremity Assessment: Overall WFL for tasks assessed   Lower Extremity Assessment Lower Extremity Assessment: Defer to PT evaluation   Cervical / Trunk Assessment Cervical / Trunk Assessment: Kyphotic   Communication Communication Communication: No difficulties   Cognition Arousal/Alertness: Awake/alert Behavior During Therapy: WFL for tasks assessed/performed Overall Cognitive Status: Within Functional Limits for tasks assessed                                     General Comments       Exercises     Shoulder Instructions      Home  Living Family/patient expects to be discharged to:: Private residence Living Arrangements: Other relatives (sister) Available Help at Discharge: Family Type of Home: House Home Access: Stairs to enter Technical brewer of Steps: 3-4   Home Layout: One level     Bathroom Shower/Tub: Teacher, early years/pre: Standard     Home Equipment: Environmental consultant - 2 wheels          Prior Functioning/Environment Level of Independence: Independent with assistive device(s)                 OT Problem List: Cardiopulmonary status limiting activity      OT Treatment/Interventions:      OT Goals(Current goals can be found in the care plan section) Acute Rehab OT Goals OT Goal Formulation: All assessment and education complete, DC therapy  OT Frequency:     Barriers to D/C:            Co-evaluation              AM-PAC OT "6 Clicks" Daily Activity     Outcome Measure Help from another person eating meals?: None Help from  another person taking care of personal grooming?: None Help from another person toileting, which includes using toliet, bedpan, or urinal?: None Help from another person bathing (including washing, rinsing, drying)?: None Help from another person to put on and taking off regular upper body clothing?: None Help from another person to put on and taking off regular lower body clothing?: None 6 Click Score: 24   End of Session Equipment Utilized During Treatment: Rolling walker;Oxygen Nurse Communication: Mobility status (o2 sat)  Activity Tolerance: Patient tolerated treatment well Patient left: in chair;with call bell/phone within reach;with chair alarm set  OT Visit Diagnosis: Muscle weakness (generalized) (M62.81)                Time: 0768-0881 OT Time Calculation (min): 15 min Charges:  OT General Charges $OT Visit: 1 Visit OT Evaluation $OT Eval Low Complexity: 1 Low  Jannice Beitzel, OTR/L [place]  Office [phone] Pager: [place] 09/23/2020, 11:47 AM

## 2020-09-23 NOTE — Plan of Care (Signed)

## 2020-09-23 NOTE — Plan of Care (Signed)
  Problem: Education: Goal: Knowledge of General Education information will improve Description Including pain rating scale, medication(s)/side effects and non-pharmacologic comfort measures Outcome: Progressing   Problem: Health Behavior/Discharge Planning: Goal: Ability to manage health-related needs will improve Outcome: Progressing   

## 2020-09-24 DIAGNOSIS — J9601 Acute respiratory failure with hypoxia: Secondary | ICD-10-CM

## 2020-09-24 DIAGNOSIS — E43 Unspecified severe protein-calorie malnutrition: Secondary | ICD-10-CM

## 2020-09-24 LAB — CBC WITH DIFFERENTIAL/PLATELET
Abs Immature Granulocytes: 0.04 10*3/uL (ref 0.00–0.07)
Basophils Absolute: 0 10*3/uL (ref 0.0–0.1)
Basophils Relative: 0 %
Eosinophils Absolute: 0 10*3/uL (ref 0.0–0.5)
Eosinophils Relative: 0 %
HCT: 33.5 % — ABNORMAL LOW (ref 36.0–46.0)
Hemoglobin: 10.8 g/dL — ABNORMAL LOW (ref 12.0–15.0)
Immature Granulocytes: 1 %
Lymphocytes Relative: 9 %
Lymphs Abs: 0.4 10*3/uL — ABNORMAL LOW (ref 0.7–4.0)
MCH: 32.2 pg (ref 26.0–34.0)
MCHC: 32.2 g/dL (ref 30.0–36.0)
MCV: 100 fL (ref 80.0–100.0)
Monocytes Absolute: 0.3 10*3/uL (ref 0.1–1.0)
Monocytes Relative: 5 %
Neutro Abs: 4.2 10*3/uL (ref 1.7–7.7)
Neutrophils Relative %: 85 %
Platelets: 255 10*3/uL (ref 150–400)
RBC: 3.35 MIL/uL — ABNORMAL LOW (ref 3.87–5.11)
RDW: 14.7 % (ref 11.5–15.5)
WBC: 4.9 10*3/uL (ref 4.0–10.5)
nRBC: 0 % (ref 0.0–0.2)

## 2020-09-24 LAB — BASIC METABOLIC PANEL
Anion gap: 7 (ref 5–15)
BUN: 22 mg/dL (ref 8–23)
CO2: 29 mmol/L (ref 22–32)
Calcium: 9.1 mg/dL (ref 8.9–10.3)
Chloride: 102 mmol/L (ref 98–111)
Creatinine, Ser: 0.85 mg/dL (ref 0.44–1.00)
GFR, Estimated: >60 mL/min (ref >60)
Glucose, Bld: 120 mg/dL — ABNORMAL HIGH (ref 70–99)
Potassium: 4.7 mmol/L (ref 3.5–5.1)
Sodium: 138 mmol/L (ref 135–145)

## 2020-09-24 MED ORDER — LEVOFLOXACIN 250 MG PO TABS: 250.0000 mg | ORAL_TABLET | Freq: Every day | ORAL | 0 refills | Status: AC

## 2020-09-24 MED ORDER — LORATADINE 10 MG PO TABS
10.0000 mg | ORAL_TABLET | Freq: Every day | ORAL | 0 refills | Status: AC
Start: 2020-09-25 — End: ?

## 2020-09-24 MED ORDER — ALBUTEROL SULFATE (2.5 MG/3ML) 0.083% IN NEBU: 2.5000 mg | INHALATION_SOLUTION | Freq: Four times a day (QID) | RESPIRATORY_TRACT | 1 refills | Status: DC | PRN

## 2020-09-24 MED ORDER — LEVOFLOXACIN 500 MG PO TABS: 250.0000 mg | ORAL_TABLET | Freq: Every day | ORAL | Status: DC

## 2020-09-24 MED ORDER — PREDNISONE 20 MG PO TABS: 40.0000 mg | ORAL_TABLET | Freq: Every day | ORAL | 0 refills | Status: AC

## 2020-09-24 MED ORDER — PANTOPRAZOLE SODIUM 40 MG PO TBEC: 40.0000 mg | DELAYED_RELEASE_TABLET | Freq: Every day | ORAL | 1 refills | Status: DC

## 2020-09-24 MED ORDER — ENSURE ENLIVE PO LIQD: 237.0000 mL | Freq: Two times a day (BID) | ORAL | 12 refills | Status: DC

## 2020-09-24 MED ORDER — GUAIFENESIN ER 600 MG PO TB12: 1200.0000 mg | ORAL_TABLET | Freq: Two times a day (BID) | ORAL | 0 refills | Status: AC

## 2020-09-24 NOTE — Progress Notes (Signed)
Pt to be discharged to home this afternoon. Pt and Pt's family given discharge instructions including all Medications on the discharge AVS and schedules for these Medications. Understanding verbalized bt Pt and family. Pt to go home home on 2l [place] Oxygen Pt given instructions regarding use of tank and paper with pt for number to call once she arrives home for the [place]. AVS discharge packet with Pt at time of discharge.

## 2020-09-24 NOTE — Consult Note (Signed)
   [facility] Inpatient Consult   09/24/2020  RAYANN JOLLEY [birth year] [id number]   Patient chart reviewed for potential [place] [facility] CM) services due to high unplanned readmission risk score. Spoke with patient by telephone, HIPAA verified, and attempted to explain [facility] CM outpatient services as chronic case management. Patient states that she is preparing to discharge home but states that she is willing to receive post hospital follow up for assessment of community needs. Referral to [place] RN care manager will be placed.  Of note, [facility] Care Management services does not replace or interfere with any services that are arranged by inpatient case management or social work.  Netta Cedars, MSN, [facility] Liaison Nurse Mobile Phone [phone]  Toll free office [phone]

## 2020-09-24 NOTE — Discharge Summary (Signed)
Physician Discharge Summary  Donna Curry GLO:[id number] DOB: 08/02/1955 DOA: 09/22/2020  PCP: [name], NP  Admit date: 09/22/2020 Discharge date: 09/24/2020  Time spent: 55 minutes  Recommendations for Outpatient Follow-up:  1. Follow-up with [name], NP in 1 to 2 weeks.  On follow-up patient will need a basic metabolic profile done to follow-up on electrolytes and renal function.  Patient is being discharged on home O2.  Patient may benefit from outpatient referral to pulmonary for formal PFTs and further management of his COPD.    Discharge Diagnoses:  Principal Problem:   Acute respiratory failure with hypoxia ([place]) Active Problems:   COPD with acute exacerbation ([place])   Community acquired pneumonia   TOBACCO ABUSE   Essential hypertension   Hypokalemia   Hypertensive cardiomyopathy, 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HCC)   Bladder cancer (HCC)   Protein-calorie malnutrition, severe   Discharge Condition: Stable and improved  Diet recommendation: Regular  Filed Weights   09/22/20 1513  Weight: 33.6 kg    History of present illness:  HPI per Dr. Ellwood Dense is a 65 y.o. female with medical history significant of bladder cancer, asthma with COPD who continues to smoke cigarette, hypertension, history of TIA, who has been battling pneumonia since last week.  Patient apparently has gone through 2 rounds of antibiotics but continued to be hypoxic.  She continued to have shortness of breath and cough.  She went to [facility] today where she was seen and evaluated.  Patient was noted to now require 2 L of oxygen.  She has no established diagnosis for COPD but is currently on breathing treatments including inhaled steroids.  She is a smoker also.  She carries the diagnosis of asthma.  Patient most likely has chronic respiratory failure from COPD or asthma and now may have gotten to where she needs home oxygen as a result of her recent  pneumonia..  ED Course: Temperature 98.8 blood pressure is 154/93 pulse 110 respiratory 26 oxygen sat 71% on room air 90% 2 L.  CBC and chemistry all appear to be within normal.  Lactic acid 0.8 troponin 43 and then 42.  COVID-19 is negative.  Chest x-ray showed chronic hyperinflation and emphysema.  CT angiogram of the chest shows no PE.  There is a new peripheral airspace opacity in the right lower lobe probably pneumonia also in the lower lingula.  Patient therefore being admitted with recurrent pneumonia with possible COPD exacerbation leading to acute hypoxemia.  Hospital Course:  1 acute respiratory failure with hypoxia secondary to recurrent pneumonia and probable acute COPD exacerbation Patient presented with hypoxia, shortness of breath on exertion, cough.  Chest x-ray done consistent with emphysematous changes.  CT angiogram chest negative for PE however concerning for new peripheral airspace opacity in the right lower lobe and in the right lingula. Patient placed on IV Solu-Medrol taper and transition to oral prednisone, IV Levaquin, scheduled duo nebs, Breo Ellipta, Incruse, Claritin, Mucinex, PPI. Patient improved clinically during the hospitalization ambulatory sats obtained and patient noted to now be O2 dependent likely secondary to his COPD. Patient be discharged home on 3 more days of oral Levaquin, 3 days of prednisone, Mucinex, resumption of home Trelegy and home O2.  Outpatient follow-up with PCP 1 to 2 weeks.  2.  Hypertension Remained stable on home regimen of Norvasc.  Outpatient follow-up.   3.  Hypokalemia Repleted.  4.  History of bladder cancer Outpatient follow-up with urology.  5.  History  of tobacco abuse Tobacco cessation.  6.  Severe protein calorie malnutrition Patient seen by dietitian and noted to have severe protein calorie malnutrition related to chronic illness and evidenced by severe fat depletion, severe muscle depletion.  Patient placed on  nutritional supplementation.  Outpatient follow-up.  Procedures:  CT angiogram chest 09/22/2020  Chest x-ray 09/22/2020    Consultations:  None  Discharge Exam: Vitals:   09/24/20 1323 09/24/20 1450  BP: 135/83   Pulse: 95   Resp: 16   Temp: 98.1 F (36.7 C)   SpO2: 98% 91%    General: NAD Cardiovascular: Regular rate rhythm no murmurs rubs or gallops.  No JVD.  No lower extremity edema. Respiratory: Minimal expiratory wheezing.  No rhonchi.  No crackles.  Fair air movement.  Discharge Instructions   Discharge Instructions    Diet general   Complete by: As directed    Increase activity slowly   Complete by: As directed      Allergies as of 09/24/2020   No Known Allergies     Medication List    STOP taking these medications   amoxicillin-clavulanate 875-125 MG tablet Commonly known as: AUGMENTIN   azithromycin 250 MG tablet Commonly known as: ZITHROMAX   ondansetron 4 MG disintegrating tablet Commonly known as: Zofran ODT     TAKE these medications   acetaminophen 500 MG tablet Commonly known as: TYLENOL Take 1,000 mg by mouth every 8 (eight) hours as needed for moderate pain.   albuterol (2.5 MG/3ML) 0.083% nebulizer solution Commonly known as: PROVENTIL Take 3 mLs (2.5 mg total) by nebulization every 6 (six) hours as needed for wheezing or shortness of breath. Use 3 times daily x 4 days then every 6 hours as needed. What changed: additional instructions   amitriptyline 10 MG tablet Commonly known as: ELAVIL Take 1 tablet (10 mg total) by mouth at bedtime as needed for sleep.   amLODipine 10 MG tablet Commonly known as: NORVASC Take 1 tablet (10 mg total) by mouth daily.   aspirin EC 81 MG tablet Take 81 mg by mouth daily.   benzonatate 100 MG capsule Commonly known as: TESSALON Take 1 capsule (100 mg total) by mouth 3 (three) times daily as needed. What changed: when to take this   citalopram 10 MG tablet Commonly known as: CeleXA Take 1  tablet (10 mg total) by mouth daily.   diazepam 5 MG tablet Commonly known as: VALIUM Take 1 tablet (5 mg total) by mouth every 8 (eight) hours as needed for anxiety.   dronabinol 5 MG capsule Commonly known as: MARINOL Take 1 capsule (5 mg total) by mouth 2 (two) times daily before lunch and supper. What changed:   when to take this  reasons to take this   feeding supplement Liqd Take 237 mLs by mouth 2 (two) times daily between meals.   guaiFENesin 600 MG 12 hr tablet Commonly known as: MUCINEX Take 2 tablets (1,200 mg total) by mouth 2 (two) times daily for 3 days.   levofloxacin 250 MG tablet Commonly known as: LEVAQUIN Take 1 tablet (250 mg total) by mouth daily for 3 days. Start taking on: September 25, 2020   lidocaine-prilocaine cream Commonly known as: EMLA Apply to affected area once What changed:   how much to take  how to take this  when to take this  reasons to take this   loratadine 10 MG tablet Commonly known as: CLARITIN Take 1 tablet (10 mg total) by mouth daily. Start  taking on: September 25, 2020   megestrol 20 MG tablet Commonly known as: MEGACE Take 1 tablet (20 mg total) by mouth daily.   ondansetron 8 MG tablet Commonly known as: ZOFRAN Take 1 tablet (8 mg total) by mouth every 8 (eight) hours as needed for nausea or vomiting.   oxyCODONE 5 MG immediate release tablet Commonly known as: Oxy IR/ROXICODONE Take 1 tablet (5 mg total) by mouth every 6 (six) hours as needed for severe pain.   pantoprazole 40 MG tablet Commonly known as: PROTONIX Take 1 tablet (40 mg total) by mouth daily at 6 (six) AM. Start taking on: September 25, 2020   predniSONE 20 MG tablet Commonly known as: DELTASONE Take 2 tablets (40 mg total) by mouth daily with breakfast for 3 days. Start taking on: September 25, 2020   prochlorperazine 10 MG tablet Commonly known as: COMPAZINE Take 1 tablet (10 mg total) by mouth every 6 (six) hours as needed for nausea or vomiting.    Trelegy Ellipta 100-62.5-25 MCG/INH Aepb Generic drug: Fluticasone-Umeclidin-Vilant INHALE 1 PUFF  BY MOUTH TWICE DAILY . APPOINTMENT REQUIRED FOR FUTURE REFILLS What changed: See the new instructions.   Vitamin D (Ergocalciferol) 1.25 MG (50000 UNIT) Caps capsule Commonly known as: DRISDOL Take 1 capsule by mouth once a week            Durable Medical Equipment  (From admission, onward)         Start     Ordered   09/23/20 1748  For home use only DME oxygen  Once       Question Answer Comment  Length of Need 12 Months   Mode or (Route) Nasal cannula   Liters per Minute 2   Frequency Continuous (stationary and portable oxygen unit needed)   Oxygen conserving device Yes   Oxygen delivery system Gas      09/23/20 1748         No Known Allergies  Follow-up Information    [name], NP. Schedule an appointment as soon as possible for a visit in 2 week(s).   Specialty: Internal Medicine Why: f/u in 1-2 weeks. Contact information: [address] [address] [phone]                The results of significant diagnostics from this hospitalization (including imaging, microbiology, ancillary and laboratory) are listed below for reference.    Significant Diagnostic Studies: DG Chest 2 View  Result Date: 09/14/2020 CLINICAL DATA:  Cough for 2 weeks EXAM: CHEST - 2 VIEW COMPARISON:  None. FINDINGS: Cardiomediastinal silhouette and pulmonary vasculature are within normal limits. Lungs are hyperexpanded with advanced emphysematous changes. New airspace opacity at the lung bases suspicious for pneumonia. Right chest port unchanged in position. IMPRESSION: New basilar lung opacities, greater on the right, suspicious for pneumonia. Electronically Signed   By: Miachel Roux M.D.   On: 09/14/2020 14:54   CT Angio Chest PE W and/or Wo Contrast  Result Date: 09/22/2020 CLINICAL DATA:  Pneumonia, hypoxia. EXAM: CT ANGIOGRAPHY CHEST WITH  CONTRAST TECHNIQUE: Multidetector CT imaging of the chest was performed using the standard protocol during bolus administration of intravenous contrast. Multiplanar CT image reconstructions and MIPs were obtained to evaluate the vascular anatomy. CONTRAST:  30mL OMNIPAQUE IOHEXOL 350 MG/ML SOLN COMPARISON:  09/22/2020 chest radiograph and chest CT from 08/18/2020 FINDINGS: Cardiovascular: No filling defect is identified in the pulmonary arterial tree to suggest pulmonary embolus. Coronary, aortic arch, and branch  vessel atherosclerotic vascular disease. Mediastinum/Nodes: Unremarkable Lungs/Pleura: Severe emphysema. New peripheral airspace opacity anteriorly in the right lower lobe on image 89 series 5. Stable chronic scarring or atelectasis along the posterior basal segments of both lower lobes. Stable scarring in the left upper lobe. Upper Abdomen: Low-density fullness of both adrenal glands, unchanged. Musculoskeletal: Unremarkable Review of the MIP images confirms the above findings. IMPRESSION: 1. No filling defect is identified in the pulmonary arterial tree to suggest pulmonary embolus. 2. New peripheral airspace opacity anteriorly in the right lower lobe, favoring pneumonia. Similar smaller region of new airspace opacity in the lower lingula. 3. Coronary, aortic arch, and branch vessel atherosclerotic vascular disease. 4. Stable chronic scarring or atelectasis along the posterior basal segments of both lower lobes and left upper lobe. 5. Low-density fullness of both adrenal glands, unchanged, likely benign. 6. Emphysema and aortic atherosclerosis. Aortic Atherosclerosis (ICD10-I70.0) and Emphysema (ICD10-J43.9). Electronically Signed   By: Van Clines M.D.   On: 09/22/2020 17:21   DG Chest Portable 1 View  Result Date: 09/22/2020 CLINICAL DATA:  Shortness of breath, hypoxemia EXAM: PORTABLE CHEST 1 VIEW COMPARISON:  09/14/2020, CT 08/19/2020, 05/13/2019 FINDINGS: Hyperinflation with emphysematous  disease. Right-sided central venous port tip over the SVC. Clearing of right CP angle focal airspace disease. Chronic pleural blunting. Stable cardiomediastinal silhouette with aortic atherosclerosis. No pneumothorax. IMPRESSION: Chronic hyperinflation and emphysematous disease. Improved aeration at the right base since prior. Electronically Signed   By: Donavan Foil M.D.   On: 09/22/2020 16:25    Microbiology: Recent Results (from the past 240 hour(s))  Culture, blood (routine x 2)     Status: None (Preliminary result)   Collection Time: 09/22/20  3:45 PM   Specimen: BLOOD RIGHT FOREARM  Result Value Ref Range Status   Specimen Description   Final    BLOOD RIGHT FOREARM Performed at [facility], [address] [place], [place] [address]    Special Requests   Final    BOTTLES DRAWN AEROBIC AND ANAEROBIC Blood Culture adequate volume Performed at [facility], [address] [place], [place] [address]    Culture   Final    NO GROWTH 2 DAYS Performed at [facility], [address], [place], [place] [address]    Report Status PENDING  Incomplete  Resp Panel by RT-PCR (Flu A&B, Covid) Peripheral     Status: None   Collection Time: 09/22/20  3:45 PM   Specimen: Peripheral; Nasopharyngeal(NP) swabs in vial transport medium  Result Value Ref Range Status   SARS Coronavirus 2 by RT PCR NEGATIVE NEGATIVE Final    Comment: (NOTE) SARS-CoV-2 target nucleic acids are NOT DETECTED.  The SARS-CoV-2 RNA is generally detectable in upper respiratory specimens during the acute phase of infection. The lowest concentration of SARS-CoV-2 viral copies this assay can detect is 138 copies/mL. A negative result does not preclude SARS-Cov-2 infection and should not be used as the sole basis for treatment or other patient management decisions. A negative result may occur with  improper specimen collection/handling, submission of specimen other than nasopharyngeal swab,  presence of viral mutation(s) within the areas targeted by this assay, and inadequate number of viral copies(<138 copies/mL). A negative result must be combined with clinical observations, patient history, and epidemiological information. The expected result is Negative.  Fact Sheet for Patients:  EntrepreneurPulse.com.au  Fact Sheet for Healthcare Providers:  IncredibleEmployment.be  This test is no t yet approved or cleared by the [place]  States FDA and  has been authorized for detection and/or diagnosis of SARS-CoV-2 by FDA under an Emergency Use Authorization (EUA). This EUA will remain  in effect (meaning this test can be used) for the duration of the COVID-19 declaration under Section 564(b)(1) of the Act, 21 U.S.C.section 360bbb-3(b)(1), unless the authorization is terminated  or revoked sooner.       Influenza A by PCR NEGATIVE NEGATIVE Final   Influenza B by PCR NEGATIVE NEGATIVE Final    Comment: (NOTE) The Xpert Xpress SARS-CoV-2/FLU/RSV plus assay is intended as an aid in the diagnosis of influenza from Nasopharyngeal swab specimens and should not be used as a sole basis for treatment. Nasal washings and aspirates are unacceptable for Xpert Xpress SARS-CoV-2/FLU/RSV testing.  Fact Sheet for Patients: EntrepreneurPulse.com.au  Fact Sheet for Healthcare Providers: IncredibleEmployment.be  This test is not yet approved or cleared by the [place] FDA and has been authorized for detection and/or diagnosis of SARS-CoV-2 by FDA under an Emergency Use Authorization (EUA). This EUA will remain in effect (meaning this test can be used) for the duration of the COVID-19 declaration under Section 564(b)(1) of the Act, 21 U.S.C. section 360bbb-3(b)(1), unless the authorization is terminated or revoked.  Performed at [facility], [address] [place], [place] [address]   Culture,  blood (routine x 2)     Status: None (Preliminary result)   Collection Time: 09/22/20  3:46 PM   Specimen: BLOOD  Result Value Ref Range Status   Specimen Description   Final    BLOOD LEFT ANTECUBITAL Performed at [facility], [address] [place], [place] [address]    Special Requests   Final    BOTTLES DRAWN AEROBIC AND ANAEROBIC Blood Culture results may not be optimal due to an inadequate volume of blood received in culture bottles Performed at [facility], [address] [place], [place] [address]    Culture   Final    NO GROWTH 2 DAYS Performed at [facility], [address] [place], [place] [address]    Report Status PENDING  Incomplete     Labs: Basic Metabolic Panel: Recent Labs  Lab 09/22/20 1545 09/22/20 2229 09/23/20 0352 09/24/20 0409  NA 140  --  140 138  K 4.4  --  4.6 4.7  CL 99  --  104 102  CO2 31  --  28 29  GLUCOSE 88  --  136* 120*  BUN 12  --  12 22  CREATININE 0.79 0.73 0.69 0.85  CALCIUM 9.2  --  8.9 9.1   Liver Function Tests: Recent Labs  Lab 09/22/20 1545 09/23/20 0352  AST 17 20  ALT 14 18  ALKPHOS 85 87  BILITOT 0.2* 0.5  PROT 8.3* 7.4  ALBUMIN 3.7 3.3*   No results for input(s): LIPASE, AMYLASE in the last 168 hours. No results for input(s): AMMONIA in the last 168 hours. CBC: Recent Labs  Lab 09/22/20 1545 09/22/20 2229 09/23/20 0352 09/24/20 0409  WBC 6.0 4.7 4.5 4.9  NEUTROABS 4.2  --  4.1 4.2  HGB 13.0 12.7 12.3 10.8*  HCT 40.6 40.1 39.9 33.5*  MCV 99.5 102.3* 103.4* 100.0  PLT 293 274 281 255   Cardiac Enzymes: No results for input(s): CKTOTAL, CKMB, CKMBINDEX, TROPONINI in the last 168 hours. BNP: BNP (last 3 results) No results for input(s): BNP in the last 8760 hours.  ProBNP (last 3 results) No results  for input(s): PROBNP in the last 8760 hours.  CBG: No results for input(s): GLUCAP in the last 168 hours.     Signed:  Irine Seal MD.  [facility] 09/24/2020, 3:02 PM

## 2020-09-24 NOTE — Plan of Care (Signed)
  Problem: Education: Goal: Knowledge of General Education information will improve Description: Including pain rating scale, medication(s)/side effects and non-pharmacologic comfort measures Outcome: Progressing   Problem: Health Behavior/Discharge Planning: Goal: Ability to manage health-related needs will improve Outcome: Progressing   Problem: Clinical Measurements: Goal: Respiratory complications will improve Outcome: Progressing   Problem: Activity: Goal: Risk for activity intolerance will decrease Outcome: Progressing   Problem: Pain Managment: Goal: General experience of comfort will improve Outcome: Progressing   

## 2020-09-25 ENCOUNTER — Telehealth: Payer: Self-pay | Admitting: *Deleted

## 2020-09-25 DIAGNOSIS — J449 Chronic obstructive pulmonary disease, unspecified: Secondary | ICD-10-CM | POA: Diagnosis not present

## 2020-09-25 NOTE — Chronic Care Management (AMB) (Signed)
  Chronic Care Management   Outreach Note  09/25/2020 Name: NALEE LIGHTLE MRN: [id number] DOB: 02-03-1956  RICARDO KAYES is a 65 y.o. year old female who is a primary care patient of [name], NP. I reached out to [place] Go by phone today in response to a referral sent by Ms. [place] PCP, [name], NP.    An unsuccessful telephone outreach was attempted today. The patient was referred to the case management team for assistance with care management and care coordination.   Follow Up Plan: The care management team will reach out to the patient again over the next 3-5 days.  If patient returns call to provider office, please advise to call [place] at [phone].  [place] Management

## 2020-09-27 LAB — CULTURE, BLOOD (ROUTINE X 2)
Culture: NO GROWTH
Culture: NO GROWTH
Special Requests: ADEQUATE

## 2020-09-28 ENCOUNTER — Telehealth: Payer: Self-pay

## 2020-09-28 NOTE — Chronic Care Management (AMB) (Signed)
  Chronic Care Management   Outreach Note  09/28/2020 Name: Donna Curry MRN: [id number] DOB: [birth year]  Donna Curry is a 65 y.o. year old female who is a primary care patient of [name], NP. I reached out to [place] Go by phone today in response to a referral sent by Ms. [place] PCP, [name], NP.[place] Kitchen  A second unsuccessful telephone outreach was attempted today. The patient was referred to the case management team for assistance with care management and care coordination.   Follow Up Plan: A HIPAA compliant phone message was left for the patient providing contact information and requesting a return call. The care management team will reach out to the patient again over the next 3-5 days. If patient returns call to provider office, please advise to call [place] at [phone].  [place] Management

## 2020-09-28 NOTE — Telephone Encounter (Signed)
Transition Care Management Unsuccessful Follow-up Telephone Call  Date of discharge and from where:  09/24/2020-Donna Curry  Attempts:  3rd Attempt  Reason for unsuccessful TCM follow-up call:  No answer/busy   Attempted home & cell number-Home number-no answer-Cell busy x 3

## 2020-09-29 ENCOUNTER — Other Ambulatory Visit: Payer: Self-pay

## 2020-09-29 ENCOUNTER — Inpatient Hospital Stay: Payer: Medicare Other | Attending: Hematology & Oncology

## 2020-09-29 ENCOUNTER — Inpatient Hospital Stay: Payer: Medicare Other

## 2020-09-29 ENCOUNTER — Other Ambulatory Visit: Payer: Self-pay | Admitting: Family

## 2020-09-29 ENCOUNTER — Encounter: Payer: Self-pay | Admitting: Family

## 2020-09-29 ENCOUNTER — Inpatient Hospital Stay (HOSPITAL_BASED_OUTPATIENT_CLINIC_OR_DEPARTMENT_OTHER): Payer: Medicare Other | Admitting: Family

## 2020-09-29 VITALS — BP 153/85 | HR 90 | Temp 98.2°F | Resp 18 | Ht 62.0 in | Wt 71.0 lb

## 2020-09-29 DIAGNOSIS — D500 Iron deficiency anemia secondary to blood loss (chronic): Secondary | ICD-10-CM

## 2020-09-29 DIAGNOSIS — R911 Solitary pulmonary nodule: Secondary | ICD-10-CM | POA: Diagnosis not present

## 2020-09-29 DIAGNOSIS — C672 Malignant neoplasm of lateral wall of bladder: Secondary | ICD-10-CM

## 2020-09-29 DIAGNOSIS — Z79899 Other long term (current) drug therapy: Secondary | ICD-10-CM | POA: Insufficient documentation

## 2020-09-29 DIAGNOSIS — C671 Malignant neoplasm of dome of bladder: Secondary | ICD-10-CM

## 2020-09-29 DIAGNOSIS — C779 Secondary and unspecified malignant neoplasm of lymph node, unspecified: Secondary | ICD-10-CM | POA: Insufficient documentation

## 2020-09-29 DIAGNOSIS — R059 Cough, unspecified: Secondary | ICD-10-CM

## 2020-09-29 DIAGNOSIS — E032 Hypothyroidism due to medicaments and other exogenous substances: Secondary | ICD-10-CM

## 2020-09-29 DIAGNOSIS — Z5112 Encounter for antineoplastic immunotherapy: Secondary | ICD-10-CM | POA: Insufficient documentation

## 2020-09-29 LAB — CBC WITH DIFFERENTIAL (CANCER CENTER ONLY)
Abs Immature Granulocytes: 0.02 10*3/uL (ref 0.00–0.07)
Basophils Absolute: 0 10*3/uL (ref 0.0–0.1)
Basophils Relative: 0 %
Eosinophils Absolute: 0.1 10*3/uL (ref 0.0–0.5)
Eosinophils Relative: 2 %
HCT: 33.1 % — ABNORMAL LOW (ref 36.0–46.0)
Hemoglobin: 10.6 g/dL — ABNORMAL LOW (ref 12.0–15.0)
Immature Granulocytes: 0 %
Lymphocytes Relative: 20 %
Lymphs Abs: 0.9 10*3/uL (ref 0.7–4.0)
MCH: 32.1 pg (ref 26.0–34.0)
MCHC: 32 g/dL (ref 30.0–36.0)
MCV: 100.3 fL — ABNORMAL HIGH (ref 80.0–100.0)
Monocytes Absolute: 0.5 10*3/uL (ref 0.1–1.0)
Monocytes Relative: 10 %
Neutro Abs: 3.2 10*3/uL (ref 1.7–7.7)
Neutrophils Relative %: 68 %
Platelet Count: 246 10*3/uL (ref 150–400)
RBC: 3.3 MIL/uL — ABNORMAL LOW (ref 3.87–5.11)
RDW: 16.5 % — ABNORMAL HIGH (ref 11.5–15.5)
WBC Count: 4.7 10*3/uL (ref 4.0–10.5)
nRBC: 0 % (ref 0.0–0.2)

## 2020-09-29 LAB — TSH: TSH: 1.741 u[IU]/mL (ref 0.308–3.960)

## 2020-09-29 LAB — CMP (CANCER CENTER ONLY)
ALT: 22 U/L (ref 0–44)
AST: 16 U/L (ref 15–41)
Albumin: 3.6 g/dL (ref 3.5–5.0)
Alkaline Phosphatase: 67 U/L (ref 38–126)
Anion gap: 2 — ABNORMAL LOW (ref 5–15)
BUN: 16 mg/dL (ref 8–23)
CO2: 35 mmol/L — ABNORMAL HIGH (ref 22–32)
Calcium: 8.8 mg/dL — ABNORMAL LOW (ref 8.9–10.3)
Chloride: 106 mmol/L (ref 98–111)
Creatinine: 0.77 mg/dL (ref 0.44–1.00)
GFR, Estimated: >60 mL/min (ref >60)
Glucose, Bld: 83 mg/dL (ref 70–99)
Potassium: 3.9 mmol/L (ref 3.5–5.1)
Sodium: 143 mmol/L (ref 135–145)
Total Bilirubin: 0.2 mg/dL — ABNORMAL LOW (ref 0.3–1.2)
Total Protein: 6.1 g/dL — ABNORMAL LOW (ref 6.5–8.1)

## 2020-09-29 MED ORDER — OXYCODONE HCL 5 MG PO TABS: 5.0000 mg | ORAL_TABLET | Freq: Four times a day (QID) | ORAL | 0 refills | Status: DC | PRN

## 2020-09-29 MED ORDER — SODIUM CHLORIDE 0.9% FLUSH
10.0000 mL | INTRAVENOUS | Status: DC | PRN
  Administered 2020-09-29: 10 mL
  Filled 2020-09-29: qty 10

## 2020-09-29 MED ORDER — HEPARIN SOD (PORK) LOCK FLUSH 100 UNIT/ML IV SOLN
500.0000 [IU] | Freq: Once | INTRAVENOUS | Status: AC | PRN
  Administered 2020-09-29: 500 [IU]
  Filled 2020-09-29: qty 5

## 2020-09-29 MED ORDER — BENZONATATE 100 MG PO CAPS: 100.0000 mg | ORAL_CAPSULE | Freq: Three times a day (TID) | ORAL | 0 refills | Status: DC | PRN

## 2020-09-29 MED ORDER — SODIUM CHLORIDE 0.9 % IV SOLN
Freq: Once | INTRAVENOUS | Status: AC
  Filled 2020-09-29: qty 250

## 2020-09-29 MED ORDER — SODIUM CHLORIDE 0.9 % IV SOLN
400.0000 mg | Freq: Once | INTRAVENOUS | Status: AC
  Administered 2020-09-29: 400 mg via INTRAVENOUS
  Filled 2020-09-29: qty 16

## 2020-09-29 MED FILL — BENZONATATE 100 MG CAPS: 100 | 20 days supply | Qty: 60 | Fill #0

## 2020-09-29 MED FILL — oxyCODONE HCL 5 MG TABS: 5 | 7 days supply | Qty: 30 | Fill #0

## 2020-09-29 NOTE — Patient Instructions (Signed)
Implanted Port Insertion, Care After This sheet gives you information about how to care for yourself after your procedure. Your health care provider may also give you more specific instructions. If you have problems or questions, contact your health care provider. What can I expect after the procedure? After the procedure, it is common to have:  Discomfort at the port insertion site.  Bruising on the skin over the port. This should improve over 3-4 days. Follow these instructions at home: Port care  After your port is placed, you will get a manufacturer's information card. The card has information about your port. Keep this card with you at all times.  Take care of the port as told by your health care provider. Ask your health care provider if you or a family member can get training for taking care of the port at home. A home health care nurse may also take care of the port.  Make sure to remember what type of port you have. Incision care  Follow instructions from your health care provider about how to take care of your port insertion site. Make sure you: ? Wash your hands with soap and water before and after you change your bandage (dressing). If soap and water are not available, use hand sanitizer. ? Change your dressing as told by your health care provider. ? Leave stitches (sutures), skin glue, or adhesive strips in place. These skin closures may need to stay in place for 2 weeks or longer. If adhesive strip edges start to loosen and curl up, you may trim the loose edges. Do not remove adhesive strips completely unless your health care provider tells you to do that.  Check your port insertion site every day for signs of infection. Check for: ? Redness, swelling, or pain. ? Fluid or blood. ? Warmth. ? Pus or a bad smell.      Activity  Return to your normal activities as told by your health care provider. Ask your health care provider what activities are safe for you.  Do not  lift anything that is heavier than 10 lb (4.5 kg), or the limit that you are told, until your health care provider says that it is safe. General instructions  Take over-the-counter and prescription medicines only as told by your health care provider.  Do not take baths, swim, or use a hot tub until your health care provider approves. Ask your health care provider if you may take showers. You may only be allowed to take sponge baths.  Do not drive for 24 hours if you were given a sedative during your procedure.  Wear a medical alert bracelet in case of an emergency. This will tell any health care providers that you have a port.  Keep all follow-up visits as told by your health care provider. This is important. Contact a health care provider if:  You cannot flush your port with saline as directed, or you cannot draw blood from the port.  You have a fever or chills.  You have redness, swelling, or pain around your port insertion site.  You have fluid or blood coming from your port insertion site.  Your port insertion site feels warm to the touch.  You have pus or a bad smell coming from the port insertion site. Get help right away if:  You have chest pain or shortness of breath.  You have bleeding from your port that you cannot control. Summary  Take care of the port as told by your   health care provider. Keep the manufacturer's information card with you at all times.  Change your dressing as told by your health care provider.  Contact a health care provider if you have a fever or chills or if you have redness, swelling, or pain around your port insertion site.  Keep all follow-up visits as told by your health care provider. This information is not intended to replace advice given to you by your health care provider. Make sure you discuss any questions you have with your health care provider. Document Revised: 02/06/2018 Document Reviewed: 02/06/2018 Elsevier Patient Education   2021 Elsevier Inc.  

## 2020-09-29 NOTE — Progress Notes (Signed)
Hematology and Oncology Follow Up Visit  Donna Curry [id number] [birth year] 65 y.o. 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 She was in the hospital earlier this month with acute respiratory failure due to exacerbation of COPD but is now home.  She still has a dry cough and SOB with over exertion. She has been using her incentive spirometer and states that she has not required supplemental O2 at home.  She is still smoking a little daily but states that her family won't buy her cigarettes as often anymore.  She notes frequent urination but notes that she has increased her fluid intake. She denies pain, pressure or urgency on urination.  TSH at last visit was stable at 1.074.  No fever, chills, n/v, rash, dizziness, chest pain, palpitations, abdominal pain or changes in bowel habits.  No blood loss noted. No bruising or petechiae.  No swelling, tenderness, numbness or tingling in her extremities.  No falls or syncope to report.  She states that her appetite is great and she is taking her Marinol daily as prescribed.   ECOG Performance Status: 1 - Symptomatic but completely ambulatory  Medications:  Allergies as of 09/29/2020   No Known Allergies     Medication List       Accurate as of September 29, 2020 10:05 AM. If you have any questions, ask your nurse or doctor.        acetaminophen 500 MG tablet Commonly known as: TYLENOL Take 1,000 mg by mouth every 8 (eight) hours as needed for moderate pain.   albuterol (2.5 MG/3ML) 0.083% nebulizer solution Commonly known as:  PROVENTIL Take 3 mLs (2.5 mg total) by nebulization every 6 (six) hours as needed for wheezing or shortness of breath. Use 3 times daily x 4 days then every 6 hours as needed.   amitriptyline 10 MG tablet Commonly known as: ELAVIL Take 1 tablet (10 mg total) by mouth at bedtime as needed for sleep.   amLODipine 10 MG tablet Commonly known as: NORVASC Take 1 tablet (10 mg total) by mouth daily.   aspirin EC 81 MG tablet Take 81 mg by mouth daily.   benzonatate 100 MG capsule Commonly known as: TESSALON Take 1 capsule (100 mg total) by mouth 3 (three) times daily as needed. What changed: when to take this   citalopram 10 MG tablet Commonly known as: CeleXA Take 1 tablet (10 mg total) by mouth daily.   diazepam 5 MG tablet Commonly known as: VALIUM Take 1 tablet (5 mg total) by mouth every 8 (eight) hours as needed for anxiety.   dronabinol 5 MG capsule Commonly known as: MARINOL Take 1 capsule (5 mg total) by mouth 2 (two) times daily before lunch and supper. What changed:   when to take this  reasons to take this   feeding supplement Liqd Take 237 mLs by mouth 2 (two) times daily between meals.   lidocaine-prilocaine cream Commonly known as: EMLA Apply to affected area once What changed:   how much to take  how to take this  when to take this  reasons to take this   loratadine 10 MG tablet Commonly  known as: CLARITIN Take 1 tablet (10 mg total) by mouth daily.   megestrol 20 MG tablet Commonly known as: MEGACE Take 1 tablet (20 mg total) by mouth daily.   ondansetron 8 MG tablet Commonly known as: ZOFRAN Take 1 tablet (8 mg total) by mouth every 8 (eight) hours as needed for nausea or vomiting.   oxyCODONE 5 MG immediate release tablet Commonly known as: Oxy IR/ROXICODONE Take 1 tablet (5 mg total) by mouth every 6 (six) hours as needed for severe pain.   pantoprazole 40 MG tablet Commonly known as: PROTONIX Take 1 tablet (40 mg total) by mouth  daily at 6 (six) AM.   prochlorperazine 10 MG tablet Commonly known as: COMPAZINE Take 1 tablet (10 mg total) by mouth every 6 (six) hours as needed for nausea or vomiting.   Trelegy Ellipta 100-62.5-25 MCG/INH Aepb Generic drug: Fluticasone-Umeclidin-Vilant INHALE 1 PUFF  BY MOUTH TWICE DAILY . APPOINTMENT REQUIRED FOR FUTURE REFILLS What changed: See the new instructions.   Vitamin D (Ergocalciferol) 1.25 MG (50000 UNIT) Caps capsule Commonly known as: DRISDOL Take 1 capsule by mouth once a week       Allergies: No Known Allergies  Past Medical History, Surgical history, Social history, and Family History were reviewed and updated.  Review of Systems: All other 10 point review of systems is negative.   Physical Exam:  height is 5\' 2"  (1.575 m). Her oral temperature is 98.2 F (36.8 C). Her blood pressure is 153/85 (abnormal) and her pulse is 90. Her respiration is 18 and oxygen saturation is 94%.   Wt Readings from Last 3 Encounters:  09/22/20 74 lb (33.6 kg)  09/22/20 72 lb 6.4 oz (32.8 kg)  08/18/20 75 lb 1.6 oz (34.1 kg)    Ocular: Sclerae unicteric, pupils equal, round and reactive to light Ear-nose-throat: Oropharynx clear, dentition fair Lymphatic: No cervical or supraclavicular adenopathy Lungs no rales or rhonchi, good excursion bilaterally Heart regular rate and rhythm, no murmur appreciated Abd soft, nontender, positive bowel sounds MSK no focal spinal tenderness, no joint edema Neuro: non-focal, well-oriented, appropriate affect Breasts: Deferred   Lab Results  Component Value Date   WBC 4.7 09/29/2020   HGB 10.6 (L) 09/29/2020   HCT 33.1 (L) 09/29/2020   MCV 100.3 (H) 09/29/2020   PLT 246 09/29/2020   Lab Results  Component Value Date   FERRITIN 10 (L) 12/26/2018   IRON 34 (L) 12/26/2018   TIBC 353 12/26/2018   UIBC 319 12/26/2018   IRONPCTSAT 10 (L) 12/26/2018   Lab Results  Component Value Date   RBC 3.30 (L) 09/29/2020   No results  found for: KPAFRELGTCHN, LAMBDASER, KAPLAMBRATIO No results found for: IGGSERUM, IGA, IGMSERUM No results found for: [name], SPEI   Chemistry      Component Value Date/Time   NA 143 09/29/2020 0928   K 3.9 09/29/2020 0928   CL 106 09/29/2020 0928   CO2 35 (H) 09/29/2020 0928   BUN 16 09/29/2020 0928   CREATININE 0.77 09/29/2020 0928   CREATININE 1.10 08/20/2013 1027      Component Value Date/Time   CALCIUM 8.8 (L) 09/29/2020 0928   ALKPHOS 67 09/29/2020 0928   AST 16 09/29/2020 0928   ALT 22 09/29/2020 0928   BILITOT 0.2 (L) 09/29/2020 0928       Impression and Plan: Donna Curry is a very pleasant 65 yo African American female with locally advanced/metastatic high-grade urothelial carcinoma of the  bladder. CT scan today showed resolution of the nodularity associated with linear scarring in the lateral aspect of the left upper lobe.  TSH is pending.  She continues to tolerate immunotherapy nicely and we will proceed with treatment today as planned.  Oxycodone and tessalon perles are pending.  Follow-up in 6 weeks.  She can contact our office with any questions or concerns.   Laverna Peace, NP 3/8/202210:05 AM

## 2020-09-29 NOTE — Patient Instructions (Signed)

## 2020-09-29 NOTE — Addendum Note (Signed)
Addended by: Burney Gauze R on: 09/29/2020 11:25 AM   Modules accepted: Orders

## 2020-09-30 ENCOUNTER — Encounter: Payer: Self-pay | Admitting: Family

## 2020-09-30 ENCOUNTER — Ambulatory Visit (INDEPENDENT_AMBULATORY_CARE_PROVIDER_SITE_OTHER): Payer: Medicare Other | Admitting: Family

## 2020-09-30 ENCOUNTER — Other Ambulatory Visit: Payer: Self-pay | Admitting: Family

## 2020-09-30 VITALS — BP 150/88 | HR 98 | Temp 98.9°F | Resp 16 | Ht 62.0 in

## 2020-09-30 DIAGNOSIS — I10 Essential (primary) hypertension: Secondary | ICD-10-CM | POA: Diagnosis not present

## 2020-09-30 DIAGNOSIS — R350 Frequency of micturition: Secondary | ICD-10-CM | POA: Diagnosis not present

## 2020-09-30 DIAGNOSIS — J9601 Acute respiratory failure with hypoxia: Secondary | ICD-10-CM | POA: Diagnosis not present

## 2020-09-30 DIAGNOSIS — F17200 Nicotine dependence, unspecified, uncomplicated: Secondary | ICD-10-CM | POA: Diagnosis not present

## 2020-09-30 DIAGNOSIS — R64 Cachexia: Secondary | ICD-10-CM | POA: Diagnosis not present

## 2020-09-30 DIAGNOSIS — J449 Chronic obstructive pulmonary disease, unspecified: Secondary | ICD-10-CM | POA: Diagnosis not present

## 2020-09-30 LAB — URINALYSIS, ROUTINE W REFLEX MICROSCOPIC
Bilirubin Urine: NEGATIVE
Hgb urine dipstick: NEGATIVE
Ketones, ur: NEGATIVE
Nitrite: NEGATIVE
RBC / HPF: NONE SEEN (ref 0–?)
Specific Gravity, Urine: 1.015 (ref 1.000–1.030)
Total Protein, Urine: NEGATIVE
Urine Glucose: NEGATIVE
Urobilinogen, UA: 0.2 (ref 0.0–1.0)
pH: 7 (ref 5.0–8.0)

## 2020-09-30 LAB — BASIC METABOLIC PANEL
BUN: 11 mg/dL (ref 6–23)
CO2: 33 mEq/L — ABNORMAL HIGH (ref 19–32)
Calcium: 9.4 mg/dL (ref 8.4–10.5)
Chloride: 99 mEq/L (ref 96–112)
Creatinine, Ser: 0.79 mg/dL (ref 0.40–1.20)
GFR: 78.91 mL/min (ref >60.00)
Glucose, Bld: 76 mg/dL (ref 70–99)
Potassium: 4.5 mEq/L (ref 3.5–5.1)
Sodium: 139 mEq/L (ref 135–145)

## 2020-09-30 MED ORDER — NICOTINE 14 MG/24HR TD PT24: 14.0000 mg | MEDICATED_PATCH | Freq: Every day | TRANSDERMAL | 1 refills | Status: DC

## 2020-09-30 MED FILL — NICOTINE 14 MG/24HR PATCH: 14 | 28 days supply | Qty: 28 | Fill #0

## 2020-09-30 NOTE — Progress Notes (Addendum)
Subjective:    Patient ID: Donna Curry, female    DOB: 10/26/1955, 65 y.o.   MRN: [id number]  HPI  Patient is a 65 yr old female who presents today for hospital follow up and she is requesting a mobility examination. She was admitted on 09/24/20 with acute respiratory failure/hypoxia and acute COPD exacerbation following treatment of community acquired pneumonia.  She was sent home with oxygen via nasal cannula. She has a pulse oximeter, and has been monitoring her saturations off of the oxygen and states that oxygen levels have stayed in the mid 90's. She reports that she is breathing much better and feeling much better today than she did last visit prior to her hospitalization.     She can walk only about 20 feet without stopping. She uses a cane but due to her overall weakness this is no longer adequate for her needs. Medical diagnoses that contribute to her need for a power mobility device include: severe protein calorie malnutrition, hypertnsive cardiomyopathy, COPD, Bladder Cancer.   Due to increased falls and increased weakness, a PMD is now necessary for her safety. She has had 2 falls in the last 2 months (which is a new problem for her). Has a one floor home. She is interested in getting some additional help in the home in terms of home health aid and also a Hoveround.  She is able to transfer to/from equipment.    Wt Readings from Last 3 Encounters:  09/29/20 71 lb (32.2 kg)  09/22/20 74 lb (33.6 kg)  09/22/20 72 lb 6.4 oz (32.8 kg)   She complains about urinary frequency. It is a challenge for her to get up and down so much to use the restroom.   Review of Systems See HPI  Past Medical History:  Diagnosis Date   Anemia    Asthma    Dyspnea    with exertion    Family history of adverse reaction to anesthesia    brother wakes up and dont know who he is and sister has n/v   Goals of care, counseling/discussion 02/09/2018   History of blood transfusion     Hypertension    Iron deficiency anemia due to chronic blood loss 03/01/2018   Measles as a child   Mumps as a child   TIA (transient ischemic attack)    Tobacco abuse      Social History   Socioeconomic History   Marital status: Single    Spouse name: Not on file   Number of children: Not on file   Years of education: Not on file   Highest education level: Not on file  Occupational History   Not on file  Tobacco Use   Smoking status: Current Every Day Smoker    Packs/day: 0.25    Types: Cigarettes   Smokeless tobacco: Never Used   Tobacco comment: smoking 3 cigarettes a day  Vaping Use   Vaping Use: Former  Substance and Sexual Activity   Alcohol use: No    Alcohol/week: 0.0 standard drinks   Drug use: Never   Sexual activity: Not Currently  Other Topics Concern   Not on file  Social History Narrative   Denies hx of drug use   Single   1 daughter age 78 lives with daughter and grandson who is 37.   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   Social Determinants of Health   Financial Resource Strain: Not on file  Food Insecurity: Not on file  Transportation Needs: Not on file  Physical Activity: Not on file  Stress: Not on file  Social Connections: Not on file  Intimate Partner Violence: Not on file    Past Surgical History:  Procedure Laterality Date   CYSTOSCOPY W/ URETERAL STENT PLACEMENT Right 12/13/2017   Procedure: CYSTOSCOPY WITH RIGHT RETROGRADE PYELOGRAM/URETERAL RIGHT STENT PLACEMENT;  Surgeon: [name], MD;  Location: [facility];  Service: Urology;  Laterality: Right;   IR IMAGING GUIDED PORT INSERTION  02/23/2018   TRANSURETHRAL RESECTION OF BLADDER TUMOR N/A 12/13/2017   Procedure: TRANSURETHRAL RESECTION OF BLADDER TUMOR (TURBT);  Surgeon: [name], MD;  Location: [facility];  Service: Urology;  Laterality: N/A;   TRANSURETHRAL RESECTION OF BLADDER TUMOR N/A 10/03/2018   Procedure:  TRANSURETHRAL RESECTION OF BLADDER TUMOR (TURBT);  Surgeon: [name], MD;  Location: [facility];  Service: Urology;  Laterality: N/A;   TRANSURETHRAL RESECTION OF BLADDER TUMOR WITH GYRUS (TURBT-GYRUS)     [name] 12-13-17   tubes tied     uterine ablation     about 2005    Family History  Problem Relation Age of Onset   Hypertension Mother    Diabetes Father    Hypertension Brother    HIV Brother     No Known Allergies  Current Outpatient Medications on File Prior to Visit  Medication Sig Dispense Refill   acetaminophen (TYLENOL) 500 MG tablet Take 1,000 mg by mouth every 8 (eight) hours as needed for moderate pain.     albuterol (PROVENTIL) (2.5 MG/3ML) 0.083% nebulizer solution Take 3 mLs (2.5 mg total) by nebulization every 6 (six) hours as needed for wheezing or shortness of breath. Use 3 times daily x 4 days then every 6 hours as needed. 75 mL 1   amitriptyline (ELAVIL) 10 MG tablet Take 1 tablet (10 mg total) by mouth at bedtime as needed for sleep. 30 tablet 1   amLODipine (NORVASC) 10 MG tablet Take 1 tablet (10 mg total) by mouth daily. 90 tablet 0   aspirin EC 81 MG tablet Take 81 mg by mouth daily.     benzonatate (TESSALON) 100 MG capsule Take 1 capsule (100 mg total) by mouth 3 (three) times daily as needed. 60 capsule 0   citalopram (CELEXA) 10 MG tablet Take 1 tablet (10 mg total) by mouth daily. 90 tablet 1   diazepam (VALIUM) 5 MG tablet Take 1 tablet (5 mg total) by mouth every 8 (eight) hours as needed for anxiety. 40 tablet 0   dronabinol (MARINOL) 5 MG capsule Take 1 capsule (5 mg total) by mouth 2 (two) times daily before lunch and supper. (Patient taking differently: Take 5 mg by mouth daily as needed (nausea).) 60 capsule 0   feeding supplement (ENSURE ENLIVE / ENSURE PLUS) LIQD Take 237 mLs by mouth 2 (two) times daily between meals. 237 mL 12   lidocaine-prilocaine (EMLA) cream Apply to affected area once (Patient taking differently:  Apply 1 application topically daily as needed (pain). Apply to affected area once) 30 g 3   loratadine (CLARITIN) 10 MG tablet Take 1 tablet (10 mg total) by mouth daily. 20 tablet 0   megestrol (MEGACE) 20 MG tablet Take 1 tablet (20 mg total) by mouth daily. 30 tablet 0   ondansetron (ZOFRAN) 8 MG tablet Take 1 tablet (8 mg total) by mouth every 8 (eight) hours as needed for nausea or vomiting. 20 tablet 0  oxyCODONE (OXY IR/ROXICODONE) 5 MG immediate release tablet Take 1 tablet (5 mg total) by mouth every 6 (six) hours as needed for severe pain. 30 tablet 0   pantoprazole (PROTONIX) 40 MG tablet Take 1 tablet (40 mg total) by mouth daily at 6 (six) AM. 30 tablet 1   prochlorperazine (COMPAZINE) 10 MG tablet Take 1 tablet (10 mg total) by mouth every 6 (six) hours as needed for nausea or vomiting. 30 tablet 0   TRELEGY ELLIPTA 100-62.5-25 MCG/INH AEPB INHALE 1 PUFF  BY MOUTH TWICE DAILY . APPOINTMENT REQUIRED FOR FUTURE REFILLS (Patient taking differently: Inhale 1 puff into the lungs daily.) 60 each 0   Vitamin D, Ergocalciferol, (DRISDOL) 1.25 MG (50000 UT) CAPS capsule Take 1 capsule by mouth once a week 12 capsule 0   No current facility-administered medications on file prior to visit.    BP (!) 150/88 (BP Location: Right Arm, Patient Position: Sitting, Cuff Size: Small)    Pulse 98    Temp 98.9 F (37.2 C) (Oral)    Resp 16    Ht 5\' 2"  (1.575 m)    SpO2 97%    BMI 12.99 kg/m       Objective:   Physical Exam Constitutional:      Appearance: She is well-developed and well-nourished. She is cachectic.  Neck:     Thyroid: No thyromegaly.  Cardiovascular:     Rate and Rhythm: Normal rate and regular rhythm.     Heart sounds: Normal heart sounds. No murmur heard.   Pulmonary:     Effort: Pulmonary effort is normal. No respiratory distress.     Breath sounds: Decreased breath sounds present. No wheezing.  Musculoskeletal:     Cervical back: Neck supple.     Comments:  Bilateral UE/LE strength 4-5/5  Skin:    General: Skin is warm and dry.  Neurological:     Mental Status: She is alert and oriented to person, place, and time.     Comments: Gait is mildly unsteady  Psychiatric:        Mood and Affect: Mood and affect normal.        Behavior: Behavior normal.        Thought Content: Thought content normal.        Judgment: Judgment normal.           Assessment & Plan:   Acute respiratory failure/Acute COPD exacerbation- clinically improved. Advised pt OK to remain off of oxygen as long as her saturation remains >92%. Will refer to pulmonology for PFT's/COPD evaluation. Check follow up bmet.   Tobacco abuse- she is motivated to quit. Recommended rx for nicoderm patch 14 mcg once daily.  She is currently smoking 1/2 PPD.  Bladder Cancer- She continues chemotherapy treatments with oncology.  Cachexia- I am very concerned about her dropping weight. This has been a long standing issue for her that continues to worsen.  I believe this to be multifactorial- cancer/COPD. She is maintained on megace and marinol per oncology.  I recommended that she restart ensure one can TID in addition to her meals.    Urinary Frequency- will obtain UA/Culture to rule out UTI.  Debilitation- pt is interested in hoveround as well as home health aid.  I did a mobility assessment today.  Cane or walker does not meet her needs because she develops severe shortness of breath with exertion/walking. Patient cannot use a manual wheelchair due to upper extremity weakness 4-5/5 bilaterally. A scooter will not medically  meet patient's mobility needs in the home because her UE weakness would make it difficult to operated the tiller.   PMD is necessary get to the bathroom to toilet and bath safely and to get to the kitchen to obtain food/eat.  PMD will be used in the home and in the community.  Sheis able to safely operated a power mobility device both mentally and physically.  She is  willing and motivated to use the power mobiity device in the home.   Cancer diagnosis/COPD diagnosis contribute to her difficulty with ambulation. She is finding it more difficulty to ambulate recently an has suffered 2 falls in the last few months. A walker would not help prevent these falls the way a personal mobility device would because of her generalized weakness.  She is able to walk about 20 feet without stopping and without assistive device, however her balance is poor and her gait unsteady. Her declining weight/strength, worsening pulmonary function contribute to her need for a PMD.  Goals of Care- we discussed her overall health situation.  Her weight/COPD and cancer diagnosis could certainly qualify her for admission to hospice, however she is still receiving chemotherapy treatments and she wishes to continue these treatments.  We discussed the possibility of a community Palliative Care referral. She declines at this time, but will let me know if she changes her mind.     40 minutes spent on today's visit. Time was spent completing mobility exam, reviewing hospital record, and discussing goals of care.   This visit occurred during the SARS-CoV-2 public health emergency.  Safety protocols were in place, including screening questions prior to the visit, additional usage of staff PPE, and extensive cleaning of exam room while observing appropriate contact time as indicated for disinfecting solutions.

## 2020-09-30 NOTE — Patient Instructions (Signed)
Please start nicotine patch 14 mcg daily. Take off while you sleep and replace patch in the AM. Please complete lab work prior to leaving.

## 2020-09-30 NOTE — Chronic Care Management (AMB) (Signed)
  Chronic Care Management   Outreach Note  09/30/2020 Name: Donna Curry MRN: [id number] DOB: 1956/04/03  Donna Curry is a 65 y.o. year old female who is a primary care patient of [name], NP. I reached out to [facility] by phone today in response to a referral sent by Donna Curry's PCP, [name], NP   Third unsuccessful telephone outreach was attempted today. The patient was referred to the case management team for assistance with care management and care coordination. The patient's primary care provider has been notified of our unsuccessful attempts to make or maintain contact with the patient. The care management team is pleased to engage with this patient at any time in the future should he/she be interested in assistance from the care management team.   Follow Up Plan: We have been unable to make contact with the patient for follow up. The care management team is available to follow up with the patient after provider conversation with the patient regarding recommendation for care management engagement and subsequent re-referral to the care management team.   [facility]  Direct Dial: [phone]

## 2020-10-01 LAB — URINE CULTURE
MICRO NUMBER:: 11626619
Result:: NO GROWTH
SPECIMEN QUALITY:: ADEQUATE

## 2020-10-02 NOTE — Progress Notes (Signed)
Please advise pt that her blood work is stable and urine culture is negative for infection.

## 2020-10-09 ENCOUNTER — Telehealth: Payer: Self-pay

## 2020-10-09 NOTE — Telephone Encounter (Signed)
Hi Ruth~ Here is the contact info you requested. [facility] [address] [place], [place] [address] [phone] fax  Hope this is what you were asking for.

## 2020-10-12 NOTE — Telephone Encounter (Signed)
Called [facility] to request form for home health referral

## 2020-10-12 NOTE — Telephone Encounter (Signed)
Forms received and started. Put on provider's folder to be filled out

## 2020-10-14 ENCOUNTER — Telehealth: Payer: Self-pay | Admitting: Family

## 2020-10-14 NOTE — Telephone Encounter (Signed)
Caller: [facility]) Call back # [phone] Fax # [phone]  They received referral for scooter, however, they need a patient face sheet

## 2020-10-14 NOTE — Telephone Encounter (Signed)
Information requested faxed to [facility]

## 2020-10-23 NOTE — Telephone Encounter (Signed)
Patient called to check status of hoverround.

## 2020-10-26 ENCOUNTER — Other Ambulatory Visit: Payer: Self-pay | Admitting: *Deleted

## 2020-10-26 DIAGNOSIS — J449 Chronic obstructive pulmonary disease, unspecified: Secondary | ICD-10-CM | POA: Diagnosis not present

## 2020-10-26 DIAGNOSIS — C672 Malignant neoplasm of lateral wall of bladder: Secondary | ICD-10-CM

## 2020-10-26 DIAGNOSIS — I10 Essential (primary) hypertension: Secondary | ICD-10-CM

## 2020-10-26 MED ORDER — AMLODIPINE BESYLATE 10 MG PO TABS: 10.0000 mg | ORAL_TABLET | Freq: Every day | ORAL | 0 refills | Status: DC

## 2020-10-26 MED ORDER — OXYCODONE HCL 5 MG PO TABS: ORAL_TABLET | Freq: Four times a day (QID) | ORAL | 0 refills | Status: DC | PRN

## 2020-10-27 ENCOUNTER — Other Ambulatory Visit: Payer: Self-pay | Admitting: Family

## 2020-11-10 ENCOUNTER — Encounter: Payer: Self-pay | Admitting: Hematology & Oncology

## 2020-11-10 ENCOUNTER — Inpatient Hospital Stay (HOSPITAL_BASED_OUTPATIENT_CLINIC_OR_DEPARTMENT_OTHER): Payer: Medicare Other | Admitting: Hematology & Oncology

## 2020-11-10 ENCOUNTER — Other Ambulatory Visit: Payer: Self-pay

## 2020-11-10 ENCOUNTER — Inpatient Hospital Stay: Payer: Medicare Other | Attending: Hematology & Oncology

## 2020-11-10 ENCOUNTER — Inpatient Hospital Stay: Payer: Medicare Other

## 2020-11-10 VITALS — BP 153/81 | HR 87 | Temp 97.7°F | Resp 17 | Wt 75.0 lb

## 2020-11-10 DIAGNOSIS — Z79899 Other long term (current) drug therapy: Secondary | ICD-10-CM | POA: Insufficient documentation

## 2020-11-10 DIAGNOSIS — C779 Secondary and unspecified malignant neoplasm of lymph node, unspecified: Secondary | ICD-10-CM | POA: Diagnosis not present

## 2020-11-10 DIAGNOSIS — C672 Malignant neoplasm of lateral wall of bladder: Secondary | ICD-10-CM | POA: Insufficient documentation

## 2020-11-10 DIAGNOSIS — D509 Iron deficiency anemia, unspecified: Secondary | ICD-10-CM | POA: Insufficient documentation

## 2020-11-10 DIAGNOSIS — F17210 Nicotine dependence, cigarettes, uncomplicated: Secondary | ICD-10-CM | POA: Diagnosis not present

## 2020-11-10 DIAGNOSIS — Z5112 Encounter for antineoplastic immunotherapy: Secondary | ICD-10-CM | POA: Insufficient documentation

## 2020-11-10 DIAGNOSIS — C670 Malignant neoplasm of trigone of bladder: Secondary | ICD-10-CM | POA: Diagnosis not present

## 2020-11-10 DIAGNOSIS — C671 Malignant neoplasm of dome of bladder: Secondary | ICD-10-CM

## 2020-11-10 DIAGNOSIS — D500 Iron deficiency anemia secondary to blood loss (chronic): Secondary | ICD-10-CM

## 2020-11-10 LAB — CMP (CANCER CENTER ONLY)
ALT: 8 U/L (ref 0–44)
AST: 13 U/L — ABNORMAL LOW (ref 15–41)
Albumin: 4 g/dL (ref 3.5–5.0)
Alkaline Phosphatase: 82 U/L (ref 38–126)
Anion gap: 5 (ref 5–15)
BUN: 12 mg/dL (ref 8–23)
CO2: 32 mmol/L (ref 22–32)
Calcium: 9.7 mg/dL (ref 8.9–10.3)
Chloride: 103 mmol/L (ref 98–111)
Creatinine: 0.82 mg/dL (ref 0.44–1.00)
GFR, Estimated: >60 mL/min (ref >60)
Glucose, Bld: 92 mg/dL (ref 70–99)
Potassium: 4 mmol/L (ref 3.5–5.1)
Sodium: 140 mmol/L (ref 135–145)
Total Bilirubin: 0.3 mg/dL (ref 0.3–1.2)
Total Protein: 7 g/dL (ref 6.5–8.1)

## 2020-11-10 LAB — CBC WITH DIFFERENTIAL (CANCER CENTER ONLY)
Abs Immature Granulocytes: 0.01 10*3/uL (ref 0.00–0.07)
Basophils Absolute: 0 10*3/uL (ref 0.0–0.1)
Basophils Relative: 0 %
Eosinophils Absolute: 0.1 10*3/uL (ref 0.0–0.5)
Eosinophils Relative: 2 %
HCT: 36.7 % (ref 36.0–46.0)
Hemoglobin: 11.6 g/dL — ABNORMAL LOW (ref 12.0–15.0)
Immature Granulocytes: 0 %
Lymphocytes Relative: 16 %
Lymphs Abs: 0.7 10*3/uL (ref 0.7–4.0)
MCH: 30.3 pg (ref 26.0–34.0)
MCHC: 31.6 g/dL (ref 30.0–36.0)
MCV: 95.8 fL (ref 80.0–100.0)
Monocytes Absolute: 0.3 10*3/uL (ref 0.1–1.0)
Monocytes Relative: 7 %
Neutro Abs: 3.2 10*3/uL (ref 1.7–7.7)
Neutrophils Relative %: 75 %
Platelet Count: 234 10*3/uL (ref 150–400)
RBC: 3.83 MIL/uL — ABNORMAL LOW (ref 3.87–5.11)
RDW: 14.1 % (ref 11.5–15.5)
WBC Count: 4.3 10*3/uL (ref 4.0–10.5)
nRBC: 0 % (ref 0.0–0.2)

## 2020-11-10 LAB — IRON AND TIBC
Iron: 25 ug/dL — ABNORMAL LOW (ref 41–142)
Saturation Ratios: 7 % — ABNORMAL LOW (ref 21–57)
TIBC: 383 ug/dL (ref 236–444)
UIBC: 358 ug/dL (ref 120–384)

## 2020-11-10 LAB — FERRITIN: Ferritin: 14 ng/mL (ref 11–307)

## 2020-11-10 LAB — TSH: TSH: 0.46 u[IU]/mL (ref 0.308–3.960)

## 2020-11-10 MED ORDER — SODIUM CHLORIDE 0.9 % IV SOLN
Freq: Once | INTRAVENOUS | Status: AC
  Filled 2020-11-10: qty 250

## 2020-11-10 MED ORDER — SODIUM CHLORIDE 0.9 % IV SOLN
400.0000 mg | Freq: Once | INTRAVENOUS | Status: AC
  Administered 2020-11-10: 400 mg via INTRAVENOUS
  Filled 2020-11-10: qty 16

## 2020-11-10 MED ORDER — HEPARIN SOD (PORK) LOCK FLUSH 100 UNIT/ML IV SOLN
500.0000 [IU] | Freq: Once | INTRAVENOUS | Status: AC | PRN
  Administered 2020-11-10: 500 [IU]
  Filled 2020-11-10: qty 5

## 2020-11-10 MED ORDER — SODIUM CHLORIDE 0.9% FLUSH
10.0000 mL | INTRAVENOUS | Status: DC | PRN
  Administered 2020-11-10: 10 mL
  Filled 2020-11-10: qty 10

## 2020-11-10 NOTE — Patient Instructions (Signed)
Implanted Port Insertion, Care After This sheet gives you information about how to care for yourself after your procedure. Your health care provider may also give you more specific instructions. If you have problems or questions, contact your health care provider. What can I expect after the procedure? After the procedure, it is common to have:  Discomfort at the port insertion site.  Bruising on the skin over the port. This should improve over 3-4 days. Follow these instructions at home: Port care  After your port is placed, you will get a manufacturer's information card. The card has information about your port. Keep this card with you at all times.  Take care of the port as told by your health care provider. Ask your health care provider if you or a family member can get training for taking care of the port at home. A home health care nurse may also take care of the port.  Make sure to remember what type of port you have. Incision care  Follow instructions from your health care provider about how to take care of your port insertion site. Make sure you: ? Wash your hands with soap and water before and after you change your bandage (dressing). If soap and water are not available, use hand sanitizer. ? Change your dressing as told by your health care provider. ? Leave stitches (sutures), skin glue, or adhesive strips in place. These skin closures may need to stay in place for 2 weeks or longer. If adhesive strip edges start to loosen and curl up, you may trim the loose edges. Do not remove adhesive strips completely unless your health care provider tells you to do that.  Check your port insertion site every day for signs of infection. Check for: ? Redness, swelling, or pain. ? Fluid or blood. ? Warmth. ? Pus or a bad smell.      Activity  Return to your normal activities as told by your health care provider. Ask your health care provider what activities are safe for you.  Do not  lift anything that is heavier than 10 lb (4.5 kg), or the limit that you are told, until your health care provider says that it is safe. General instructions  Take over-the-counter and prescription medicines only as told by your health care provider.  Do not take baths, swim, or use a hot tub until your health care provider approves. Ask your health care provider if you may take showers. You may only be allowed to take sponge baths.  Do not drive for 24 hours if you were given a sedative during your procedure.  Wear a medical alert bracelet in case of an emergency. This will tell any health care providers that you have a port.  Keep all follow-up visits as told by your health care provider. This is important. Contact a health care provider if:  You cannot flush your port with saline as directed, or you cannot draw blood from the port.  You have a fever or chills.  You have redness, swelling, or pain around your port insertion site.  You have fluid or blood coming from your port insertion site.  Your port insertion site feels warm to the touch.  You have pus or a bad smell coming from the port insertion site. Get help right away if:  You have chest pain or shortness of breath.  You have bleeding from your port that you cannot control. Summary  Take care of the port as told by your   health care provider. Keep the manufacturer's information card with you at all times.  Change your dressing as told by your health care provider.  Contact a health care provider if you have a fever or chills or if you have redness, swelling, or pain around your port insertion site.  Keep all follow-up visits as told by your health care provider. This information is not intended to replace advice given to you by your health care provider. Make sure you discuss any questions you have with your health care provider. Document Revised: 02/06/2018 Document Reviewed: 02/06/2018 Elsevier Patient Education   2021 Elsevier Inc.  

## 2020-11-10 NOTE — Patient Instructions (Signed)

## 2020-11-10 NOTE — Progress Notes (Signed)
Hematology and Oncology Follow Up Visit  Donna Curry [id number] Jul 13, 1956 65 y.o. 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 is here today for follow-up and treatment.  She actually pretty good.  She feels okay.  She is still smoking about 3 cigarettes a day.  I just wonder if she is not smoking a little more than this.  She still has the underlying COPD.  Thankfully, she does not go outside that much.  Apparently, where she lives it is quite dangerous.  She hears gunshots all the time.  She would like to move if possible.  She has had no problems bleeding.  There is been no issues with cough.  She has had nausea or vomiting..  There is been no change in bowel or bladder habits.  She has had no hematuria.  She does have some frequent urination.  I told her this is actually good for her so that she make sure that she keeps emptying her bladder.  She has had no rashes.  We will try to switch her intervals now to every 2 months.  I think this is reasonable.  Overall, her appetite is seems to be doing pretty well.  She had a nice Easter weekend.  Overall, her performance status is ECOG 2.    Medications:  Allergies as of 11/10/2020   No Known Allergies     Medication List       Accurate as of November 10, 2020 10:51 AM. If you have any questions, ask your nurse or doctor.        acetaminophen 500 MG tablet Commonly known as: TYLENOL Take 1,000 mg by mouth every 8 (eight) hours as needed for moderate pain.   albuterol (2.5 MG/3ML) 0.083% nebulizer solution Commonly known as: PROVENTIL Take 3 mLs (2.5 mg total) by  nebulization every 6 (six) hours as needed for wheezing or shortness of breath. Use 3 times daily x 4 days then every 6 hours as needed.   amitriptyline 10 MG tablet Commonly known as: ELAVIL Take 1 tablet (10 mg total) by mouth at bedtime as needed for sleep.   amLODipine 10 MG tablet Commonly known as: NORVASC Take 1 tablet (10 mg total) by mouth daily.   aspirin EC 81 MG tablet Take 81 mg by mouth daily.   benzonatate 100 MG capsule Commonly known as: TESSALON TAKE 1 CAPSULE BY MOUTH THREE TIMES DAILY AS NEEDED   citalopram 10 MG tablet Commonly known as: CeleXA Take 1 tablet (10 mg total) by mouth daily.   diazepam 5 MG tablet Commonly known as: VALIUM Take 1 tablet (5 mg total) by mouth every 8 (eight) hours as needed for anxiety.   dronabinol 5 MG capsule Commonly known as: MARINOL Take 1 capsule (5 mg total) by mouth 2 (two) times daily before lunch and supper. What changed:   when to take this  reasons to take this   feeding supplement Liqd Take 237 mLs by mouth 2 (two) times daily between meals.   lidocaine-prilocaine cream Commonly known as: EMLA Apply to affected area once What changed:   how much to take  how to take this  when to take this  reasons to take this   loratadine 10 MG tablet Commonly known as: CLARITIN Take 1 tablet (10 mg total) by mouth daily.   megestrol 20 MG tablet Commonly known as: MEGACE Take 1 tablet (20 mg total) by mouth daily.   nicotine 14 mg/24hr patch Commonly known as: NICODERM CQ - dosed in mg/24 hours PLACE 1 PATCH (14 MG TOTAL) ONTO THE SKIN DAILY.   ondansetron 8 MG tablet Commonly known as: ZOFRAN Take 1 tablet (8 mg total) by mouth every 8 (eight) hours as needed for nausea or vomiting.   oxyCODONE 5 MG immediate release tablet Commonly known as: Oxy IR/ROXICODONE TAKE 1 TABLET BY MOUTH EVERY 6 HOURS AS NEEDED FOR SEVERE PAIN   pantoprazole 40 MG tablet Commonly known as: PROTONIX Take 1 tablet (40  mg total) by mouth daily at 6 (six) AM.   prochlorperazine 10 MG tablet Commonly known as: COMPAZINE Take 1 tablet (10 mg total) by mouth every 6 (six) hours as needed for nausea or vomiting.   Trelegy Ellipta 100-62.5-25 MCG/INH Aepb Generic drug: Fluticasone-Umeclidin-Vilant Inhale 1 puff into the lungs in the morning and at bedtime.   Vitamin D (Ergocalciferol) 1.25 MG (50000 UNIT) Caps capsule Commonly known as: DRISDOL Take 1 capsule by mouth once a week       Allergies: No Known Allergies  Past Medical History, Surgical history, Social history, and Family History were reviewed and updated.  Review of Systems: Review of Systems  Constitutional: Negative.   HENT: Negative.   Eyes: Negative.   Respiratory: Negative.   Cardiovascular: Negative.   Gastrointestinal: Positive for abdominal pain.  Genitourinary: Positive for frequency.  Musculoskeletal: Positive for joint pain and myalgias.  Skin: Negative.   Neurological: Negative.   Endo/Heme/Allergies: Negative.   Psychiatric/Behavioral: Negative.      Physical Exam:  weight is 75 lb (34 kg). Her oral temperature is 97.7 F (36.5 C). Her blood pressure is 153/81 (abnormal) and her pulse is 87. Her respiration is 17 and oxygen saturation is 100%.   Wt Readings from Last 3 Encounters:  11/10/20 75 lb (34 kg)  09/29/20 71 lb (32.2 kg)  09/22/20 74 lb (33.6 kg)    Physical Exam Vitals reviewed.  HENT:     Head: Normocephalic and atraumatic.  Eyes:     Pupils: Pupils are equal, round, and reactive to light.  Cardiovascular:     Rate and Rhythm: Normal rate and regular rhythm.     Heart sounds: Normal heart sounds.  Pulmonary:     Effort: Pulmonary effort is normal.     Breath sounds: Normal breath sounds.  Abdominal:     General: Bowel sounds are normal.     Palpations: Abdomen is soft.  Musculoskeletal:        General: No tenderness or deformity. Normal range of motion.     Cervical back: Normal range of  motion.  Lymphadenopathy:     Cervical: No cervical adenopathy.  Skin:    General: Skin is warm and dry.     Findings: No erythema or rash.  Neurological:     Mental Status: She is alert and oriented to person, place, and time.  Psychiatric:        Behavior: Behavior normal.        Thought Content: Thought content normal.        Judgment: Judgment normal.      Lab Results  Component Value Date   WBC 4.3 11/10/2020  HGB 11.6 (L) 11/10/2020   HCT 36.7 11/10/2020   MCV 95.8 11/10/2020   PLT 234 11/10/2020   Lab Results  Component Value Date   FERRITIN 10 (L) 12/26/2018   IRON 34 (L) 12/26/2018   TIBC 353 12/26/2018   UIBC 319 12/26/2018   IRONPCTSAT 10 (L) 12/26/2018   Lab Results  Component Value Date   RBC 3.83 (L) 11/10/2020   No results found for: KPAFRELGTCHN, LAMBDASER, KAPLAMBRATIO No results found for: IGGSERUM, IGA, IGMSERUM No results found for: [name], SPEI   Chemistry      Component Value Date/Time   NA 139 09/30/2020 1137   K 4.5 09/30/2020 1137   CL 99 09/30/2020 1137   CO2 33 (H) 09/30/2020 1137   BUN 11 09/30/2020 1137   CREATININE 0.79 09/30/2020 1137   CREATININE 0.77 09/29/2020 0928   CREATININE 1.10 08/20/2013 1027      Component Value Date/Time   CALCIUM 9.4 09/30/2020 1137   ALKPHOS 67 09/29/2020 0928   AST 16 09/29/2020 0928   ALT 22 09/29/2020 0928   BILITOT 0.2 (L) 09/29/2020 0928       Impression and Plan: Ms. Lisanti is a very pleasant 65 yo African American female with 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  We will go ahead with her immunotherapy today.  Hopefully, we can continue to move this interval out.  Hopefully at some point we can go every 3 months.  I would still like to get a CT scan on her.  I would like to see what the left upper lung abnormality looks like now.  Last time she had a CT scan, there is no problems with  the left lung.  She had little bit of scarring.  I told that she really has stopped smoking.  This is going to get her.    I will see her back in 2 months.  We will get a CT scan when we see her back.   [name], MD 4/19/202210:51 AM

## 2020-11-13 NOTE — Telephone Encounter (Signed)
Caller: Jessenia ([employer]) Call back number: [phone]  States they need patient [place] [employer] care insurance information in order to process power wheel chair

## 2020-11-13 NOTE — Telephone Encounter (Signed)
Called [employer] to let them know we do not have a copy of patient's [facility] card. They will contact the patient to get this needed information from her

## 2020-11-16 ENCOUNTER — Telehealth: Payer: Self-pay | Admitting: Family

## 2020-11-16 NOTE — Telephone Encounter (Signed)
Ov note faxed to [facility]

## 2020-11-16 NOTE — Telephone Encounter (Signed)
Caller  Donna Curry [phone]  More information is need the last page for visit date 09/30/20 is missing page 11 please fax last page to [phone]   Please advice

## 2020-11-17 ENCOUNTER — Ambulatory Visit: Payer: Medicare Other | Admitting: Family

## 2020-11-17 ENCOUNTER — Telehealth: Payer: Self-pay | Admitting: Family

## 2020-11-17 NOTE — Telephone Encounter (Signed)
If so- pt should reschedule appt that was missed from this afternoon correct?

## 2020-11-17 NOTE — Telephone Encounter (Signed)
I have not seen this yet, can you please check for it in the basket?

## 2020-11-17 NOTE — Telephone Encounter (Signed)
Caller: Lovey New[facility]er [facility]) Call back # [phone]  Would like to know if you received the product description form (fax yesterday)

## 2020-11-18 NOTE — Telephone Encounter (Signed)
Form here and put in provider's folder

## 2020-11-25 DIAGNOSIS — J449 Chronic obstructive pulmonary disease, unspecified: Secondary | ICD-10-CM | POA: Diagnosis not present

## 2020-11-30 ENCOUNTER — Other Ambulatory Visit: Payer: Self-pay | Admitting: *Deleted

## 2020-11-30 DIAGNOSIS — R110 Nausea: Secondary | ICD-10-CM

## 2020-11-30 DIAGNOSIS — T451X5A Adverse effect of antineoplastic and immunosuppressive drugs, initial encounter: Secondary | ICD-10-CM

## 2020-11-30 DIAGNOSIS — R630 Anorexia: Secondary | ICD-10-CM

## 2020-11-30 DIAGNOSIS — C672 Malignant neoplasm of lateral wall of bladder: Secondary | ICD-10-CM

## 2020-11-30 MED ORDER — DRONABINOL 5 MG PO CAPS: 5.0000 mg | ORAL_CAPSULE | Freq: Two times a day (BID) | ORAL | 0 refills | Status: DC

## 2020-11-30 MED ORDER — OXYCODONE HCL 5 MG PO TABS: ORAL_TABLET | Freq: Four times a day (QID) | ORAL | 0 refills | Status: DC | PRN

## 2020-12-22 ENCOUNTER — Telehealth: Payer: Self-pay

## 2020-12-22 ENCOUNTER — Inpatient Hospital Stay: Payer: Medicare Other

## 2020-12-22 ENCOUNTER — Inpatient Hospital Stay: Payer: Medicare Other | Admitting: Hematology & Oncology

## 2020-12-22 NOTE — Telephone Encounter (Signed)
Pgt called in and cancelled todays appts due to shoulder pain, she is aware of her 6/14 appts and will come in then   [facility]

## 2020-12-26 DIAGNOSIS — J449 Chronic obstructive pulmonary disease, unspecified: Secondary | ICD-10-CM | POA: Diagnosis not present

## 2021-01-03 ENCOUNTER — Other Ambulatory Visit: Payer: Self-pay

## 2021-01-03 ENCOUNTER — Inpatient Hospital Stay (HOSPITAL_COMMUNITY)
Admission: EM | Admit: 2021-01-03 | Discharge: 2021-01-09 | DRG: 190 | Disposition: A | Discharge status: Home Health | Payer: Medicare Other | Attending: Internal Medicine | Admitting: Internal Medicine

## 2021-01-03 ENCOUNTER — Emergency Department (HOSPITAL_COMMUNITY): Payer: Medicare Other

## 2021-01-03 ENCOUNTER — Encounter (HOSPITAL_COMMUNITY): Payer: Self-pay

## 2021-01-03 DIAGNOSIS — F17210 Nicotine dependence, cigarettes, uncomplicated: Secondary | ICD-10-CM | POA: Diagnosis present

## 2021-01-03 DIAGNOSIS — C679 Malignant neoplasm of bladder, unspecified: Secondary | ICD-10-CM | POA: Diagnosis present

## 2021-01-03 DIAGNOSIS — J9601 Acute respiratory failure with hypoxia: Secondary | ICD-10-CM | POA: Diagnosis not present

## 2021-01-03 DIAGNOSIS — R6889 Other general symptoms and signs: Secondary | ICD-10-CM | POA: Diagnosis not present

## 2021-01-03 DIAGNOSIS — Z8673 Personal history of transient ischemic attack (TIA), and cerebral infarction without residual deficits: Secondary | ICD-10-CM

## 2021-01-03 DIAGNOSIS — Z20822 Contact with and (suspected) exposure to covid-19: Secondary | ICD-10-CM | POA: Diagnosis present

## 2021-01-03 DIAGNOSIS — Z8249 Family history of ischemic heart disease and other diseases of the circulatory system: Secondary | ICD-10-CM | POA: Diagnosis not present

## 2021-01-03 DIAGNOSIS — Z681 Body mass index (BMI) 19 or less, adult: Secondary | ICD-10-CM

## 2021-01-03 DIAGNOSIS — J189 Pneumonia, unspecified organism: Secondary | ICD-10-CM

## 2021-01-03 DIAGNOSIS — Z7982 Long term (current) use of aspirin: Secondary | ICD-10-CM

## 2021-01-03 DIAGNOSIS — I10 Essential (primary) hypertension: Secondary | ICD-10-CM | POA: Diagnosis not present

## 2021-01-03 DIAGNOSIS — R636 Underweight: Secondary | ICD-10-CM | POA: Diagnosis present

## 2021-01-03 DIAGNOSIS — Z8551 Personal history of malignant neoplasm of bladder: Secondary | ICD-10-CM

## 2021-01-03 DIAGNOSIS — Z79891 Long term (current) use of opiate analgesic: Secondary | ICD-10-CM | POA: Diagnosis not present

## 2021-01-03 DIAGNOSIS — D638 Anemia in other chronic diseases classified elsewhere: Secondary | ICD-10-CM | POA: Diagnosis not present

## 2021-01-03 DIAGNOSIS — F419 Anxiety disorder, unspecified: Secondary | ICD-10-CM | POA: Diagnosis present

## 2021-01-03 DIAGNOSIS — J441 Chronic obstructive pulmonary disease with (acute) exacerbation: Secondary | ICD-10-CM | POA: Diagnosis not present

## 2021-01-03 DIAGNOSIS — F32A Depression, unspecified: Secondary | ICD-10-CM | POA: Diagnosis not present

## 2021-01-03 DIAGNOSIS — Z66 Do not resuscitate: Secondary | ICD-10-CM | POA: Diagnosis not present

## 2021-01-03 DIAGNOSIS — R64 Cachexia: Secondary | ICD-10-CM | POA: Diagnosis present

## 2021-01-03 DIAGNOSIS — R0602 Shortness of breath: Secondary | ICD-10-CM | POA: Diagnosis not present

## 2021-01-03 DIAGNOSIS — R0603 Acute respiratory distress: Secondary | ICD-10-CM

## 2021-01-03 DIAGNOSIS — G8929 Other chronic pain: Secondary | ICD-10-CM | POA: Diagnosis not present

## 2021-01-03 DIAGNOSIS — Z743 Need for continuous supervision: Secondary | ICD-10-CM | POA: Diagnosis not present

## 2021-01-03 DIAGNOSIS — J439 Emphysema, unspecified: Secondary | ICD-10-CM | POA: Diagnosis not present

## 2021-01-03 DIAGNOSIS — R0689 Other abnormalities of breathing: Secondary | ICD-10-CM | POA: Diagnosis not present

## 2021-01-03 DIAGNOSIS — Z79899 Other long term (current) drug therapy: Secondary | ICD-10-CM | POA: Diagnosis not present

## 2021-01-03 DIAGNOSIS — I499 Cardiac arrhythmia, unspecified: Secondary | ICD-10-CM | POA: Diagnosis not present

## 2021-01-03 DIAGNOSIS — Z833 Family history of diabetes mellitus: Secondary | ICD-10-CM | POA: Diagnosis not present

## 2021-01-03 LAB — CBC WITH DIFFERENTIAL/PLATELET
Abs Immature Granulocytes: 0.07 10*3/uL (ref 0.00–0.07)
Basophils Absolute: 0 10*3/uL (ref 0.0–0.1)
Basophils Relative: 0 %
Eosinophils Absolute: 0.1 10*3/uL (ref 0.0–0.5)
Eosinophils Relative: 0 %
HCT: 37.8 % (ref 36.0–46.0)
Hemoglobin: 11.8 g/dL — ABNORMAL LOW (ref 12.0–15.0)
Immature Granulocytes: 0 %
Lymphocytes Relative: 11 %
Lymphs Abs: 1.8 10*3/uL (ref 0.7–4.0)
MCH: 29.4 pg (ref 26.0–34.0)
MCHC: 31.2 g/dL (ref 30.0–36.0)
MCV: 94 fL (ref 80.0–100.0)
Monocytes Absolute: 1 10*3/uL (ref 0.1–1.0)
Monocytes Relative: 6 %
Neutro Abs: 13.1 10*3/uL — ABNORMAL HIGH (ref 1.7–7.7)
Neutrophils Relative %: 83 %
Platelets: 313 10*3/uL (ref 150–400)
RBC: 4.02 MIL/uL (ref 3.87–5.11)
RDW: 14.2 % (ref 11.5–15.5)
WBC: 16 10*3/uL — ABNORMAL HIGH (ref 4.0–10.5)
nRBC: 0 % (ref 0.0–0.2)

## 2021-01-03 LAB — COMPREHENSIVE METABOLIC PANEL
ALT: 16 U/L (ref 0–44)
AST: 24 U/L (ref 15–41)
Albumin: 3.2 g/dL — ABNORMAL LOW (ref 3.5–5.0)
Alkaline Phosphatase: 90 U/L (ref 38–126)
Anion gap: 10 (ref 5–15)
BUN: 13 mg/dL (ref 8–23)
CO2: 29 mmol/L (ref 22–32)
Calcium: 9.1 mg/dL (ref 8.9–10.3)
Chloride: 102 mmol/L (ref 98–111)
Creatinine, Ser: 0.77 mg/dL (ref 0.44–1.00)
GFR, Estimated: >60 mL/min (ref >60)
Glucose, Bld: 179 mg/dL — ABNORMAL HIGH (ref 70–99)
Potassium: 3.8 mmol/L (ref 3.5–5.1)
Sodium: 141 mmol/L (ref 135–145)
Total Bilirubin: 0.4 mg/dL (ref 0.3–1.2)
Total Protein: 6.9 g/dL (ref 6.5–8.1)

## 2021-01-03 LAB — I-STAT ARTERIAL BLOOD GAS, ED
Acid-Base Excess: 4 mmol/L — ABNORMAL HIGH (ref 0.0–2.0)
Bicarbonate: 31.2 mmol/L — ABNORMAL HIGH (ref 20.0–28.0)
Calcium, Ion: 1.27 mmol/L (ref 1.15–1.40)
HCT: 34 % — ABNORMAL LOW (ref 36.0–46.0)
Hemoglobin: 11.6 g/dL — ABNORMAL LOW (ref 12.0–15.0)
O2 Saturation: 92 %
Patient temperature: 98.6
Potassium: 3.3 mmol/L — ABNORMAL LOW (ref 3.5–5.1)
Sodium: 142 mmol/L (ref 135–145)
TCO2: 33 mmol/L — ABNORMAL HIGH (ref 22–32)
pCO2 arterial: 60.6 mmHg — ABNORMAL HIGH (ref 32.0–48.0)
pH, Arterial: 7.32 — ABNORMAL LOW (ref 7.350–7.450)
pO2, Arterial: 73 mmHg — ABNORMAL LOW (ref 83.0–108.0)

## 2021-01-03 LAB — I-STAT CHEM 8, ED
BUN: 13 mg/dL (ref 8–23)
Calcium, Ion: 1.21 mmol/L (ref 1.15–1.40)
Chloride: 103 mmol/L (ref 98–111)
Creatinine, Ser: 0.7 mg/dL (ref 0.44–1.00)
Glucose, Bld: 180 mg/dL — ABNORMAL HIGH (ref 70–99)
HCT: 37 % (ref 36.0–46.0)
Hemoglobin: 12.6 g/dL (ref 12.0–15.0)
Potassium: 3.7 mmol/L (ref 3.5–5.1)
Sodium: 142 mmol/L (ref 135–145)
TCO2: 31 mmol/L (ref 22–32)

## 2021-01-03 LAB — RESP PANEL BY RT-PCR (FLU A&B, COVID) ARPGX2
Influenza A by PCR: NEGATIVE
Influenza B by PCR: NEGATIVE
SARS Coronavirus 2 by RT PCR: NEGATIVE

## 2021-01-03 MED ORDER — HYDRALAZINE HCL 20 MG/ML IJ SOLN
10.0000 mg | INTRAMUSCULAR | Status: DC | PRN
  Administered 2021-01-04: 10 mg via INTRAVENOUS
  Filled 2021-01-03: qty 1

## 2021-01-03 MED ORDER — ENOXAPARIN SODIUM 40 MG/0.4ML IJ SOSY
40.0000 mg | PREFILLED_SYRINGE | INTRAMUSCULAR | Status: DC
  Administered 2021-01-04: 40 mg via SUBCUTANEOUS
  Filled 2021-01-03: qty 0.4

## 2021-01-03 MED ORDER — ASPIRIN EC 81 MG PO TBEC
81.0000 mg | DELAYED_RELEASE_TABLET | Freq: Every day | ORAL | Status: DC
  Administered 2021-01-05 – 2021-01-09 (×5): 81 mg via ORAL
  Filled 2021-01-03 (×5): qty 1

## 2021-01-03 MED ORDER — METHYLPREDNISOLONE SODIUM SUCC 40 MG IJ SOLR
40.0000 mg | Freq: Two times a day (BID) | INTRAMUSCULAR | Status: DC
  Administered 2021-01-03 – 2021-01-09 (×11): 40 mg via INTRAVENOUS
  Filled 2021-01-03 (×11): qty 1

## 2021-01-03 MED ORDER — ENSURE ENLIVE PO LIQD
237.0000 mL | Freq: Two times a day (BID) | ORAL | Status: DC
  Administered 2021-01-04 – 2021-01-07 (×5): 237 mL via ORAL
  Filled 2021-01-03 (×2): qty 237

## 2021-01-03 MED ORDER — BUDESONIDE 0.25 MG/2ML IN SUSP: 0.2500 mg | Freq: Two times a day (BID) | RESPIRATORY_TRACT | Status: DC

## 2021-01-03 MED ORDER — SODIUM CHLORIDE 0.9 % IV SOLN
1.0000 g | Freq: Once | INTRAVENOUS | Status: AC
  Administered 2021-01-03: 1 g via INTRAVENOUS
  Filled 2021-01-03: qty 10

## 2021-01-03 MED ORDER — SODIUM CHLORIDE 0.9 % IV SOLN
500.0000 mg | Freq: Once | INTRAVENOUS | Status: AC
  Administered 2021-01-03: 500 mg via INTRAVENOUS
  Filled 2021-01-03: qty 500

## 2021-01-03 MED ORDER — IPRATROPIUM BROMIDE 0.02 % IN SOLN: 0.5000 mg | RESPIRATORY_TRACT | Status: DC

## 2021-01-03 MED ORDER — ACETAMINOPHEN 325 MG PO TABS: 650.0000 mg | ORAL_TABLET | Freq: Four times a day (QID) | ORAL | Status: DC | PRN

## 2021-01-03 MED ORDER — IPRATROPIUM-ALBUTEROL 0.5-2.5 (3) MG/3ML IN SOLN
3.0000 mL | RESPIRATORY_TRACT | Status: DC
  Administered 2021-01-03 – 2021-01-05 (×10): 3 mL via RESPIRATORY_TRACT
  Filled 2021-01-03 (×9): qty 3

## 2021-01-03 MED ORDER — BUDESONIDE 0.25 MG/2ML IN SUSP
0.2500 mg | Freq: Two times a day (BID) | RESPIRATORY_TRACT | Status: DC
  Administered 2021-01-04 – 2021-01-09 (×10): 0.25 mg via RESPIRATORY_TRACT
  Filled 2021-01-03 (×12): qty 2

## 2021-01-03 MED ORDER — AMLODIPINE BESYLATE 10 MG PO TABS
10.0000 mg | ORAL_TABLET | Freq: Every day | ORAL | Status: DC
  Administered 2021-01-05 – 2021-01-09 (×5): 10 mg via ORAL
  Filled 2021-01-03 (×5): qty 1

## 2021-01-03 MED ORDER — SODIUM CHLORIDE 0.9 % IV SOLN
1.0000 g | INTRAVENOUS | Status: DC
  Administered 2021-01-04 – 2021-01-07 (×4): 1 g via INTRAVENOUS
  Filled 2021-01-03: qty 10
  Filled 2021-01-03: qty 1
  Filled 2021-01-03: qty 10
  Filled 2021-01-03: qty 1
  Filled 2021-01-03: qty 10

## 2021-01-03 MED ORDER — ALBUTEROL SULFATE (2.5 MG/3ML) 0.083% IN NEBU: 2.5000 mg | INHALATION_SOLUTION | RESPIRATORY_TRACT | Status: DC

## 2021-01-03 MED ORDER — OXYCODONE HCL 5 MG PO TABS
5.0000 mg | ORAL_TABLET | Freq: Four times a day (QID) | ORAL | Status: DC | PRN
  Administered 2021-01-03 – 2021-01-09 (×10): 5 mg via ORAL
  Filled 2021-01-03 (×10): qty 1

## 2021-01-03 MED ORDER — ALBUTEROL (5 MG/ML) CONTINUOUS INHALATION SOLN
5.0000 mg/h | INHALATION_SOLUTION | Freq: Once | RESPIRATORY_TRACT | Status: AC
  Administered 2021-01-03: 5 mg/h via RESPIRATORY_TRACT
  Filled 2021-01-03: qty 20

## 2021-01-03 MED ORDER — ACETAMINOPHEN 650 MG RE SUPP: 650.0000 mg | Freq: Four times a day (QID) | RECTAL | Status: DC | PRN

## 2021-01-03 MED ORDER — ALBUTEROL SULFATE (2.5 MG/3ML) 0.083% IN NEBU: 3.0000 mL | INHALATION_SOLUTION | RESPIRATORY_TRACT | Status: DC | PRN

## 2021-01-03 MED ORDER — ALBUTEROL SULFATE (2.5 MG/3ML) 0.083% IN NEBU
2.5000 mg | INHALATION_SOLUTION | RESPIRATORY_TRACT | Status: DC | PRN
  Administered 2021-01-04: 2.5 mg via RESPIRATORY_TRACT
  Filled 2021-01-03: qty 3

## 2021-01-03 MED ORDER — CITALOPRAM HYDROBROMIDE 20 MG PO TABS
10.0000 mg | ORAL_TABLET | Freq: Every day | ORAL | Status: DC
  Administered 2021-01-05 – 2021-01-09 (×5): 10 mg via ORAL
  Filled 2021-01-03 (×5): qty 1

## 2021-01-03 NOTE — H&P (Addendum)
History and Physical    SUHAILA TROIANO INO:[id number] DOB: [birth year] DOA: 01/03/2021  PCP: [name], NP  Patient coming from: Home.  Chief Complaint: Shortness of breath.  HPI: Donna Curry is a 65 y.o. female with history of COPD with ongoing tobacco abuse, hypertension, bladder cancer on immunotherapy, anemia presents to the ER because of worsening shortness of breath for the last 2 days with nonproductive cough.  Denies chest pain fever or chills.  ED Course: In the ER patient is found to be diffusely wheezing had to be placed on BiPAP since patient was descending.  Chest x-ray does not show anything acute labs show leukocytosis with blood count of 16,000 and COVID test was negative.  Patient was placed on nebulizer treatment IV steroids admitted for acute respiratory failure with hypoxia secondary to COPD exacerbation.  ABG done shows pH of 7.32 with PCO2 of 60.6.  Review of Systems: As per HPI, rest all negative.   Past Medical History:  Diagnosis Date   Anemia    Asthma    Dyspnea    with exertion    Family history of adverse reaction to anesthesia    brother wakes up and dont know who he is and sister has n/v   Goals of care, counseling/discussion 02/09/2018   History of blood transfusion    Hypertension    Iron deficiency anemia due to chronic blood loss 03/01/2018   Measles as a child   Mumps as a child   TIA (transient ischemic attack)    Tobacco abuse     Past Surgical History:  Procedure Laterality Date   CYSTOSCOPY W/ URETERAL STENT PLACEMENT Right 12/13/2017   Procedure: CYSTOSCOPY WITH RIGHT RETROGRADE PYELOGRAM/URETERAL RIGHT STENT PLACEMENT;  Surgeon: [name], MD;  Location: [facility];  Service: Urology;  Laterality: Right;   IR IMAGING GUIDED PORT INSERTION  02/23/2018   TRANSURETHRAL RESECTION OF BLADDER TUMOR N/A 12/13/2017   Procedure: TRANSURETHRAL RESECTION OF BLADDER TUMOR (TURBT);  Surgeon: [name], MD;  Location: [facility];   Service: Urology;  Laterality: N/A;   TRANSURETHRAL RESECTION OF BLADDER TUMOR N/A 10/03/2018   Procedure: TRANSURETHRAL RESECTION OF BLADDER TUMOR (TURBT);  Surgeon: [name], MD;  Location: [facility];  Service: Urology;  Laterality: N/A;   TRANSURETHRAL RESECTION OF BLADDER TUMOR WITH GYRUS (TURBT-GYRUS)     [name] 12-13-17   tubes tied     uterine ablation     about 2005     reports that she has been smoking cigarettes. She has been smoking an average of 0.25 packs per day. She has never used smokeless tobacco. She reports that she does not drink alcohol and does not use drugs.  No Known Allergies  Family History  Problem Relation Age of Onset   Hypertension Mother    Diabetes Father    Hypertension Brother    HIV Brother     Prior to Admission medications   Medication Sig Start Date End Date Taking? Authorizing Provider  acetaminophen (TYLENOL) 500 MG tablet Take 1,000 mg by mouth every 8 (eight) hours as needed for moderate pain.   Yes [provider]  albuterol (PROVENTIL) (2.5 MG/3ML) 0.083% nebulizer solution Take 3 mLs (2.5 mg total) by nebulization every 6 (six) hours as needed for wheezing or shortness of breath. Use 3 times daily x 4 days then every 6 hours as needed. 09/24/20  Yes Eugenie Filler, MD  amitriptyline (ELAVIL) 10 MG tablet  Take 1 tablet (10 mg total) by mouth at bedtime as needed for sleep. 05/28/19  Yes [name], NP  amLODipine (NORVASC) 10 MG tablet Take 1 tablet (10 mg total) by mouth daily. 10/26/20  Yes [name], MD  aspirin EC 81 MG tablet Take 81 mg by mouth daily.   Yes [provider]  benzonatate (TESSALON) 100 MG capsule TAKE 1 CAPSULE BY MOUTH THREE TIMES DAILY AS NEEDED Patient taking differently: Take 100 mg by mouth 3 (three) times daily as needed for cough. 09/29/20 09/29/21 Yes [place], Holli Humbles, NP  citalopram (CELEXA) 10 MG tablet Take 1 tablet (10 mg total) by mouth daily. 03/25/19  Yes [name], NP  diazepam (VALIUM) 5 MG tablet Take 1 tablet (5 mg total) by mouth every 8 (eight) hours as needed for anxiety. 09/03/20  Yes [name], [name], MD  feeding supplement (ENSURE ENLIVE / ENSURE PLUS) LIQD Take 237 mLs by mouth 2 (two) times daily between meals. 09/24/20  Yes Eugenie Filler, MD  Fluticasone-Umeclidin-Vilant (TRELEGY ELLIPTA) 100-62.5-25 MCG/INH AEPB Inhale 1 puff into the lungs in the morning and at bedtime. 10/27/20  Yes [name], NP  loratadine (CLARITIN) 10 MG tablet Take 1 tablet (10 mg total) by mouth daily. Patient taking differently: Take 10 mg by mouth daily as needed for allergies. 09/25/20  Yes Eugenie Filler, MD  ondansetron (ZOFRAN) 8 MG tablet Take 1 tablet (8 mg total) by mouth every 8 (eight) hours as needed for nausea or vomiting. 01/16/19  Yes [name], [name], MD  oxyCODONE (OXY IR/ROXICODONE) 5 MG immediate release tablet TAKE 1 TABLET BY MOUTH EVERY 6 HOURS AS NEEDED FOR SEVERE PAIN Patient taking differently: Take 5 mg by mouth every 6 (six) hours as needed for severe pain. 11/30/20 05/29/21 Yes [name], [name], MD  prochlorperazine (COMPAZINE) 10 MG tablet Take 1 tablet (10 mg total) by mouth every 6 (six) hours as needed for nausea or vomiting. 03/27/19  Yes [name], MD  dronabinol (MARINOL) 5 MG capsule Take 1 capsule (5 mg total) by mouth 2 (two) times daily before lunch and supper. Patient not taking: No sig reported 11/30/20   [name], MD  lidocaine-prilocaine (EMLA) cream Apply to affected area once Patient not taking: No sig reported 11/06/18   [name], MD  megestrol (MEGACE) 20 MG tablet Take 1 tablet (20 mg total) by mouth daily. Patient not taking: Reported on 01/03/2021 03/27/19   [name], MD  nicotine (NICODERM CQ - DOSED IN MG/24 HOURS) 14 mg/24hr patch PLACE 1 PATCH (14 MG TOTAL) ONTO THE SKIN DAILY. Patient not taking: No sig reported 09/30/20 09/30/21  [name], NP  pantoprazole (PROTONIX) 40  MG tablet Take 1 tablet (40 mg total) by mouth daily at 6 (six) AM. Patient not taking: No sig reported 09/25/20   Eugenie Filler, MD  Vitamin D, Ergocalciferol, (DRISDOL) 1.25 MG (50000 UT) CAPS capsule Take 1 capsule by mouth once a week Patient not taking: Reported on 01/03/2021 12/26/18   [name], MD    Physical Exam: Constitutional: Moderately built and nourished. Vitals:   01/03/21 1930 01/03/21 1945 01/03/21 2015 01/03/21 2115  BP: 134/80 (!) 141/89 (!) 171/156 133/80  Pulse: (!) 115 (!) 121 (!) 148 (!) 109  Resp: 19 19 (!) 30 (!) 21  Temp:      TempSrc:      SpO2: 100% 100% 98% 100%  Weight:  Eyes: Anicteric no pallor. ENMT: No discharge from the ears eyes nose or mouth. Neck: No mass felt.  No neck rigidity.  No JVD appreciated. Respiratory: Bilateral expiratory wheeze and no crepitations. Cardiovascular: S1-S2 heard. Abdomen: Soft nontender bowel sound present. Musculoskeletal: No edema. Skin: No rash. Neurologic: Alert awake oriented to time place and person.  Moves all extremities. Psychiatric: Appears normal.  Normal affect.   Labs on Admission: I have personally reviewed following labs and imaging studies  CBC: Recent Labs  Lab 01/03/21 1837 01/03/21 1858 01/03/21 2058  WBC 16.0*  --   --   NEUTROABS 13.1*  --   --   HGB 11.8* 12.6 11.6*  HCT 37.8 37.0 34.0*  MCV 94.0  --   --   PLT 313  --   --    Basic Metabolic Panel: Recent Labs  Lab 01/03/21 1837 01/03/21 1858 01/03/21 2058  NA 141 142 142  K 3.8 3.7 3.3*  CL 102 103  --   CO2 29  --   --   GLUCOSE 179* 180*  --   BUN 13 13  --   CREATININE 0.77 0.70  --   CALCIUM 9.1  --   --    GFR: Estimated Creatinine Clearance: 33.5 mL/min (by C-G formula based on SCr of 0.7 mg/dL). Liver Function Tests: Recent Labs  Lab 01/03/21 1837  AST 24  ALT 16  ALKPHOS 90  BILITOT 0.4  PROT 6.9  ALBUMIN 3.2*   No results for input(s): LIPASE, AMYLASE in the last 168 hours. No results  for input(s): AMMONIA in the last 168 hours. Coagulation Profile: No results for input(s): INR, PROTIME in the last 168 hours. Cardiac Enzymes: No results for input(s): CKTOTAL, CKMB, CKMBINDEX, TROPONINI in the last 168 hours. BNP (last 3 results) No results for input(s): PROBNP in the last 8760 hours. HbA1C: No results for input(s): HGBA1C in the last 72 hours. CBG: No results for input(s): GLUCAP in the last 168 hours. Lipid Profile: No results for input(s): CHOL, HDL, LDLCALC, TRIG, CHOLHDL, LDLDIRECT in the last 72 hours. Thyroid Function Tests: No results for input(s): TSH, T4TOTAL, FREET4, T3FREE, THYROIDAB in the last 72 hours. Anemia Panel: No results for input(s): VITAMINB12, FOLATE, FERRITIN, TIBC, IRON, RETICCTPCT in the last 72 hours. Urine analysis:    Component Value Date/Time   COLORURINE YELLOW 09/30/2020 1137   APPEARANCEUR Sl Cloudy (A) 09/30/2020 1137   LABSPEC 1.015 09/30/2020 1137   PHURINE 7.0 09/30/2020 1137   GLUCOSEU NEGATIVE 09/30/2020 1137   HGBUR NEGATIVE 09/30/2020 1137   BILIRUBINUR NEGATIVE 09/30/2020 1137   BILIRUBINUR 1+ 11/30/2017 1348   KETONESUR NEGATIVE 09/30/2020 1137   PROTEINUR NEGATIVE 02/06/2019 1429   UROBILINOGEN 0.2 09/30/2020 1137   NITRITE NEGATIVE 09/30/2020 1137   LEUKOCYTESUR MODERATE (A) 09/30/2020 1137   Sepsis Labs: @LABRCNTIP (procalcitonin:4,lacticidven:4) ) Recent Results (from the past 240 hour(s))  Resp Panel by RT-PCR (Flu A&B, Covid) Nasopharyngeal Swab     Status: None   Collection Time: 01/03/21  6:45 PM   Specimen: Nasopharyngeal Swab; Nasopharyngeal(NP) swabs in vial transport medium  Result Value Ref Range Status   SARS Coronavirus 2 by RT PCR NEGATIVE NEGATIVE Final    Comment: (NOTE) SARS-CoV-2 target nucleic acids are NOT DETECTED.  The SARS-CoV-2 RNA is generally detectable in upper respiratory specimens during the acute phase of infection. The lowest concentration of SARS-CoV-2 viral copies this  assay can detect is 138 copies/mL. A negative result does not preclude SARS-Cov-2 infection and  should not be used as the sole basis for treatment or other patient management decisions. A negative result may occur with  improper specimen collection/handling, submission of specimen other than nasopharyngeal swab, presence of viral mutation(s) within the areas targeted by this assay, and inadequate number of viral copies(<138 copies/mL). A negative result must be combined with clinical observations, patient history, and epidemiological information. The expected result is Negative.  Fact Sheet for Patients:  EntrepreneurPulse.com.au  Fact Sheet for Healthcare Providers:  IncredibleEmployment.be  This test is no t yet approved or cleared by the [place] FDA and  has been authorized for detection and/or diagnosis of SARS-CoV-2 by FDA under an Emergency Use Authorization (EUA). This EUA will remain  in effect (meaning this test can be used) for the duration of the COVID-19 declaration under Section 564(b)(1) of the Act, 21 U.S.C.section 360bbb-3(b)(1), unless the authorization is terminated  or revoked sooner.       Influenza A by PCR NEGATIVE NEGATIVE Final   Influenza B by PCR NEGATIVE NEGATIVE Final    Comment: (NOTE) The Xpert Xpress SARS-CoV-2/FLU/RSV plus assay is intended as an aid in the diagnosis of influenza from Nasopharyngeal swab specimens and should not be used as a sole basis for treatment. Nasal washings and aspirates are unacceptable for Xpert Xpress SARS-CoV-2/FLU/RSV testing.  Fact Sheet for Patients: EntrepreneurPulse.com.au  Fact Sheet for Healthcare Providers: IncredibleEmployment.be  This test is not yet approved or cleared by the [place] FDA and has been authorized for detection and/or diagnosis of SARS-CoV-2 by FDA under an Emergency Use Authorization (EUA). This EUA will  remain in effect (meaning this test can be used) for the duration of the COVID-19 declaration under Section 564(b)(1) of the Act, 21 U.S.C. section 360bbb-3(b)(1), unless the authorization is terminated or revoked.  Performed at [facility], [place] [address], [place], [place] [address]      Radiological Exams on Admission: DG Chest Portable 1 View  Result Date: 01/03/2021 CLINICAL DATA:  Shortness of breath EXAM: PORTABLE CHEST 1 VIEW COMPARISON:  September 22, 2020 FINDINGS: Incomplete assessment of the RIGHT lateral soft tissues and RIGHT costophrenic angle. The cardiomediastinal silhouette is unchanged in contour.Atherosclerotic calcifications. RIGHT chest port with tip terminating over the superior cavoatrial junction. Hyperinflation. Emphysematous changes. No pleural effusion. No pneumothorax. No acute pleuroparenchymal abnormality. Visualized abdomen is unremarkable. Multilevel degenerative changes of the thoracic spine. IMPRESSION: No acute cardiopulmonary abnormality. Electronically Signed   By: Valentino Saxon MD   On: 01/03/2021 18:56    EKG: Independently reviewed.  Sinus tachycardia.  Assessment/Plan Principal Problem:   Acute respiratory failure with hypoxia (HCC) Active Problems:   Essential hypertension   Bladder cancer (HCC)   COPD with acute exacerbation (HCC)   Acute respiratory failure with hypoxemia (HCC)    Acute respiratory failure with hypoxia secondary to COPD exacerbation presently on BiPAP.  We will place patient IV Solu-Medrol continue antibiotics and nebulized Pulmicort.  Closely monitor for respiratory status and mental status. Hypertension on amlodipine we will also add as needed IV hydralazine. History of bladder cancer being followed by oncologist. Chronic anemia follow CBC. Chronic pain on oxycodone. History of depression and anxiety on citalopram.  Since patient has acute respiratory failure requiring BiPAP at this time but need close monitoring  for any further worsening in inpatient status.   DVT prophylaxis: Lovenox. Code Status: Initially wanted DNR but changed to full code later. Family Communication: Patient's daughter. Disposition Plan: Home. Consults called: None. Admission status: Inpatient  Rise Patience MD [facility] Pager [phone].  If 7PM-7AM, please contact night-coverage [email or web address] Password [facility]  01/03/2021, 10:44 PM

## 2021-01-03 NOTE — Progress Notes (Signed)
Attempting trial off bipap at this time.  Bipap on s/b at bedside.  RT will continue to monitor.

## 2021-01-03 NOTE — ED Provider Notes (Signed)
[facility] EMERGENCY DEPARTMENT Provider Note   CSN: [id number] Arrival date & time:        History Chief Complaint  Patient presents with   Shortness of Breath    Donna Curry is a 65 y.o. female past ministry of asthma, hypertension, bladder cancer who presents via EMS for respiratory distress.  EMS was called after patient was having trouble breathing.  She reports that over the last couple days, she has had some cough, congestion.  Reports that the difficulty breathing got worse today.  Initially arrival, she was 84% on room air.  They put her at 2 L nasal cannula and that bumped her up to 88%.  Patient was tripoding, had accessory muscle usage.  They gave 125 mg Solu-Medrol, 2 g of mag, Atrovent, albuterol nebulizer.  They finally transition patient with a CPAP.  They had improvement in O2 sats with CPAP.  Patient does report that she feels like it is somewhat better with CPAP.  EM LEVEL 5 CAVEAT DUE TO AMS  The history is provided by the spouse.      Past Medical History:  Diagnosis Date   Anemia    Asthma    Dyspnea    with exertion    Family history of adverse reaction to anesthesia    brother wakes up and dont know who he is and sister has n/v   Goals of care, counseling/discussion 02/09/2018   History of blood transfusion    Hypertension    Iron deficiency anemia due to chronic blood loss 03/01/2018   Measles as a child   Mumps as a child   TIA (transient ischemic attack)    Tobacco abuse     Patient Active Problem List   Diagnosis Date Noted   Community acquired pneumonia    COPD with acute exacerbation ([place])    Hypoxia 09/22/2020   Protein-calorie malnutrition, severe 09/03/2018   Acute respiratory distress 08/31/2018   Acute respiratory failure with hypoxia ([place]) 08/31/2018   HCAP (healthcare-associated pneumonia) 08/06/2018   Emphysema of lung ([place]) 08/06/2018   Acute on chronic respiratory failure with hypoxia ([place]) 08/06/2018    Iron deficiency anemia due to chronic blood loss 03/01/2018   Goals of care, counseling/discussion 02/09/2018   Bladder cancer ([place]) 12/13/2017   Hypertensive cardiomyopathy, 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[place]) 04/29/2016   Hypokalemia 04/02/2016   Protein-energy malnutrition ([place]) 04/02/2016   Hypertensive emergency 04/01/2016   Dizziness and giddiness 04/12/2013   Palpitations 03/27/2013   Weight loss 06/08/2012   Degenerative disc disease, lumbar 01/30/2012   Adrenal mass ([place]) 01/30/2012   Hypercalcemia 01/30/2012   Hyperproteinemia 01/30/2012   Borderline diabetes 01/30/2012   Normocytic anemia 03/11/2011   TOBACCO ABUSE 08/31/2009   Essential hypertension 08/31/2009    Past Surgical History:  Procedure Laterality Date   CYSTOSCOPY W/ URETERAL STENT PLACEMENT Right 12/13/2017   Procedure: CYSTOSCOPY WITH RIGHT RETROGRADE PYELOGRAM/URETERAL RIGHT STENT PLACEMENT;  Surgeon: [name], MD;  Location: [facility];  Service: Urology;  Laterality: Right;   IR IMAGING GUIDED PORT INSERTION  02/23/2018   TRANSURETHRAL RESECTION OF BLADDER TUMOR N/A 12/13/2017   Procedure: TRANSURETHRAL RESECTION OF BLADDER TUMOR (TURBT);  Surgeon: [name], MD;  Location: [facility];  Service: Urology;  Laterality: N/A;   TRANSURETHRAL RESECTION OF BLADDER TUMOR N/A 10/03/2018   Procedure: TRANSURETHRAL RESECTION OF BLADDER TUMOR (TURBT);  Surgeon: [name], MD;  Location: [facility];  Service: Urology;  Laterality: N/A;  TRANSURETHRAL RESECTION OF BLADDER TUMOR WITH GYRUS (TURBT-GYRUS)     [name] 12-13-17   tubes tied     uterine ablation     about 2005     OB History   No obstetric history on file.     Family History  Problem Relation Age of Onset   Hypertension Mother    Diabetes Father    Hypertension Brother    HIV Brother     Social History   Tobacco Use   Smoking status: Every Day    Packs/day: 0.25    Pack years: 0.00    Types: Cigarettes   Smokeless tobacco: Never    Tobacco comments:    smoking 3 cigarettes a day  Vaping Use   Vaping Use: Former  Substance Use Topics   Alcohol use: No    Alcohol/week: 0.0 standard drinks   Drug use: Never    Home Medications Prior to Admission medications   Medication Sig Start Date End Date Taking? Authorizing Provider  acetaminophen (TYLENOL) 500 MG tablet Take 1,000 mg by mouth every 8 (eight) hours as needed for moderate pain.   Yes [provider]  albuterol (PROVENTIL) (2.5 MG/3ML) 0.083% nebulizer solution Take 3 mLs (2.5 mg total) by nebulization every 6 (six) hours as needed for wheezing or shortness of breath. Use 3 times daily x 4 days then every 6 hours as needed. 09/24/20  Yes Eugenie Filler, MD  amitriptyline (ELAVIL) 10 MG tablet Take 1 tablet (10 mg total) by mouth at bedtime as needed for sleep. 05/28/19  Yes [name], NP  amLODipine (NORVASC) 10 MG tablet Take 1 tablet (10 mg total) by mouth daily. 10/26/20  Yes [name], MD  aspirin EC 81 MG tablet Take 81 mg by mouth daily.   Yes [provider]  benzonatate (TESSALON) 100 MG capsule TAKE 1 CAPSULE BY MOUTH THREE TIMES DAILY AS NEEDED Patient taking differently: Take 100 mg by mouth 3 (three) times daily as needed for cough. 09/29/20 09/29/21 Yes [place], Holli Humbles, NP  citalopram (CELEXA) 10 MG tablet Take 1 tablet (10 mg total) by mouth daily. 03/25/19  Yes [name], NP  diazepam (VALIUM) 5 MG tablet Take 1 tablet (5 mg total) by mouth every 8 (eight) hours as needed for anxiety. 09/03/20  Yes [name], [name], MD  feeding supplement (ENSURE ENLIVE / ENSURE PLUS) LIQD Take 237 mLs by mouth 2 (two) times daily between meals. 09/24/20  Yes Eugenie Filler, MD  Fluticasone-Umeclidin-Vilant (TRELEGY ELLIPTA) 100-62.5-25 MCG/INH AEPB Inhale 1 puff into the lungs in the morning and at bedtime. 10/27/20  Yes [name], NP  loratadine (CLARITIN) 10 MG tablet Take 1 tablet (10 mg total) by mouth  daily. Patient taking differently: Take 10 mg by mouth daily as needed for allergies. 09/25/20  Yes Eugenie Filler, MD  ondansetron (ZOFRAN) 8 MG tablet Take 1 tablet (8 mg total) by mouth every 8 (eight) hours as needed for nausea or vomiting. 01/16/19  Yes [name], [name], MD  oxyCODONE (OXY IR/ROXICODONE) 5 MG immediate release tablet TAKE 1 TABLET BY MOUTH EVERY 6 HOURS AS NEEDED FOR SEVERE PAIN Patient taking differently: Take 5 mg by mouth every 6 (six) hours as needed for severe pain. 11/30/20 05/29/21 Yes [name], [name], MD  prochlorperazine (COMPAZINE) 10 MG tablet Take 1 tablet (10 mg total) by mouth every 6 (six) hours as needed for nausea or vomiting. 03/27/19  Yes [name], MD  dronabinol (MARINOL) 5 MG capsule Take 1 capsule (5 mg total) by mouth 2 (two) times daily before lunch and supper. Patient not taking: No sig reported 11/30/20   [name], MD  lidocaine-prilocaine (EMLA) cream Apply to affected area once Patient not taking: No sig reported 11/06/18   [name], MD  megestrol (MEGACE) 20 MG tablet Take 1 tablet (20 mg total) by mouth daily. Patient not taking: Reported on 01/03/2021 03/27/19   [name], MD  nicotine (NICODERM CQ - DOSED IN MG/24 HOURS) 14 mg/24hr patch PLACE 1 PATCH (14 MG TOTAL) ONTO THE SKIN DAILY. Patient not taking: No sig reported 09/30/20 09/30/21  [name], NP  pantoprazole (PROTONIX) 40 MG tablet Take 1 tablet (40 mg total) by mouth daily at 6 (six) AM. Patient not taking: No sig reported 09/25/20   Eugenie Filler, MD  Vitamin D, Ergocalciferol, (DRISDOL) 1.25 MG (50000 UT) CAPS capsule Take 1 capsule by mouth once a week Patient not taking: Reported on 01/03/2021 12/26/18   [name], MD    Allergies    Patient has no known allergies.  Review of Systems   Review of Systems  Constitutional:  Negative for fever.  HENT:  Positive for congestion.   Respiratory:  Positive for cough, shortness of breath and  wheezing.   Cardiovascular:  Negative for chest pain.  Gastrointestinal:  Negative for abdominal pain, nausea and vomiting.  Genitourinary:  Negative for dysuria and hematuria.  Neurological:  Negative for headaches.  All other systems reviewed and are negative.  Physical Exam Updated Vital Signs BP (!) 171/156   Pulse (!) 148   Temp (!) 97.4 F (36.3 C) (Axillary)   Resp (!) 30   Wt 29.9 kg   SpO2 98%   BMI 12.05 kg/m   Physical Exam Vitals and nursing note reviewed.  Constitutional:      Appearance: She is cachectic. She is ill-appearing.  HENT:     Head: Normocephalic and atraumatic.  Eyes:     General: Lids are normal.     Conjunctiva/sclera: Conjunctivae normal.     Pupils: Pupils are equal, round, and reactive to light.  Cardiovascular:     Rate and Rhythm: Regular rhythm. Tachycardia present.     Pulses: Normal pulses.     Heart sounds: Normal heart sounds. No murmur heard.   No friction rub. No gallop.  Pulmonary:     Effort: Tachypnea, accessory muscle usage and respiratory distress present.     Breath sounds: Wheezing and rhonchi present.     Comments: Diminished breath signs noted.  She has expiratory wheezing in mid lung fields down to the bases as well as rhonchi diffusely. Abdominal:     Palpations: Abdomen is soft. Abdomen is not rigid.     Tenderness: There is no abdominal tenderness. There is no guarding.     Comments: Abdomen is soft, non-distended, non-tender. No rigidity, No guarding. No peritoneal signs.  Musculoskeletal:        General: Normal range of motion.     Cervical back: Full passive range of motion without pain.  Skin:    General: Skin is warm and dry.     Capillary Refill: Capillary refill takes less than 2 seconds.  Neurological:     Mental Status: She is alert and oriented to person, place, and time.  Psychiatric:        Speech: Speech normal.    ED Results / Procedures / Treatments  Labs (all labs ordered are listed, but only  abnormal results are displayed) Labs Reviewed  COMPREHENSIVE METABOLIC PANEL - Abnormal; Notable for the following components:      Result Value   Glucose, Bld 179 (*)    Albumin 3.2 (*)    All other components within normal limits  CBC WITH DIFFERENTIAL/PLATELET - Abnormal; Notable for the following components:   WBC 16.0 (*)    Hemoglobin 11.8 (*)    Neutro Abs 13.1 (*)    All other components within normal limits  I-STAT CHEM 8, ED - Abnormal; Notable for the following components:   Glucose, Bld 180 (*)    All other components within normal limits  I-STAT ARTERIAL BLOOD GAS, ED - Abnormal; Notable for the following components:   pH, Arterial 7.320 (*)    pCO2 arterial 60.6 (*)    pO2, Arterial 73 (*)    Bicarbonate 31.2 (*)    TCO2 33 (*)    Acid-Base Excess 4.0 (*)    Potassium 3.3 (*)    HCT 34.0 (*)    Hemoglobin 11.6 (*)    All other components within normal limits  RESP PANEL BY RT-PCR (FLU A&B, COVID) [id number]    EKG EKG Interpretation  Date/Time:  Sunday January 03 2021 18:39:34 EDT Ventricular Rate:  122 PR Interval:    QRS Duration: 70 QT Interval:  301 QTC Calculation: 429 R Axis:   91 Text Interpretation: Sinus tachycardia Ventricular premature complex Right axis deviation Consider left ventricular hypertrophy Repol abnrm suggests ischemia, diffuse leads Artifact in lead(s) I III aVR aVL aVF V2 V4 V5 V6 No significant change since last tracing Confirmed by Isla Pence [phone]) on 01/03/2021 6:51:05 PM  Radiology DG Chest Portable 1 View  Result Date: 01/03/2021 CLINICAL DATA:  Shortness of breath EXAM: PORTABLE CHEST 1 VIEW COMPARISON:  September 22, 2020 FINDINGS: Incomplete assessment of the RIGHT lateral soft tissues and RIGHT costophrenic angle. The cardiomediastinal silhouette is unchanged in contour.Atherosclerotic calcifications. RIGHT chest port with tip terminating over the superior cavoatrial junction. Hyperinflation. Emphysematous changes. No pleural  effusion. No pneumothorax. No acute pleuroparenchymal abnormality. Visualized abdomen is unremarkable. Multilevel degenerative changes of the thoracic spine. IMPRESSION: No acute cardiopulmonary abnormality. Electronically Signed   By: Valentino Saxon MD   On: 01/03/2021 18:56    Procedures .Critical Care  Date/Time: 01/03/2021 9:34 PM Performed by: [name], PA-C Authorized by: [name], PA-C   Critical care provider statement:    Critical care time (minutes):  40   Critical care was necessary to treat or prevent imminent or life-threatening deterioration of the following conditions:  Respiratory failure   Critical care was time spent personally by me on the following activities:  Discussions with consultants, evaluation of patient's response to treatment, examination of patient, ordering and performing treatments and interventions, ordering and review of laboratory studies, ordering and review of radiographic studies, pulse oximetry, re-evaluation of patient's condition, obtaining history from patient or surrogate and review of old charts   I assumed direction of critical care for this patient from another provider in my specialty: yes     Care discussed with: admitting provider     Medications Ordered in ED Medications  albuterol (PROVENTIL) (2.5 MG/3ML) 0.083% nebulizer solution 3 mL (has no administration in time range)  azithromycin (ZITHROMAX) 500 mg in sodium chloride 0.9 % 250 mL IVPB (has no administration in time range)  cefTRIAXone (ROCEPHIN) 1 g in sodium chloride 0.9 % 100 mL IVPB (  has no administration in time range)  albuterol (PROVENTIL,VENTOLIN) solution continuous neb (5 mg/hr Nebulization Given 01/03/21 1851)    ED Course  I have reviewed the triage vital signs and the nursing notes.  Pertinent labs & imaging results that were available during my care of the patient were reviewed by me and considered in my medical decision making (see chart for  details).    MDM Rules/Calculators/A&P                          65 year old female past medical history of asthma, bladder cancer brought in by EMS for respiratory distress.  Was 5 initially on room air.  They placed 2 L nasal cannula and got her up to 88.  Patient with accessory muscle usage, wheezing.  EMS transitioned her to CPAP with some improvement.  On initial ED arrival, she was maintaining CPAP but was still tachypneic, with signs of increased work of breathing.  Patient with diffuse expiratory wheezing, rhonchi noted.  Patient placed on BiPAP with continuous neb.  Here she is afebrile, tachypneic, tachycardic.  Plan to check labs, chest x-ray.  Patient with history of COPD and asthma.  Given the significant mount of wheezing, she was having feel this most likely represents COPD versus asthma exacerbation.  Will cover for COPD exacerbation.  CMP shows glucose of 179.  CBC shows leukocytosis 16.  Hemoglobin is 11.8.  Chest x-ray shows no acute abnormality.  Patient finished continuous neb.  She reports feeling much better and looks much more comfortable.Still tachy and tachypneic, likely from CAT. She still has some residual wheezing.   We will do a trial off the CPAP. Patient on [place]. Will require admission for likely COPD vs asthma exacerbation.   Patient desatted to 84% off the CPAP. Will put her back on. Will require admission for continuous management of COPD versus asthma.   Discussed patient with Dr. Hal Hope (hospitalist) who accepts patient for admission.   Portions of this note were generated with [employer]. Dictation errors may occur despite best attempts at proofreading.   Final Clinical Impression(s) / ED Diagnoses Final diagnoses:  COPD exacerbation [facility])  Respiratory distress    Rx / DC Orders ED Discharge Orders     None        Desma Mcgregor 01/03/21 2135    Isla Pence, MD 01/03/21 2214

## 2021-01-03 NOTE — ED Notes (Signed)
Shaune Leeks - Daughter - [phone] - would like a call when patient goes upstairs.

## 2021-01-03 NOTE — ED Triage Notes (Signed)
Patient bib ems after respiratory distress. Hx of asthma and lung cancer.  Was 88% on 2L  at home and en route placed on CPAP Given continuous neb and atrovent as well as 125mg  Solumedrol and Mag 2 gram with ems.

## 2021-01-04 DIAGNOSIS — J9601 Acute respiratory failure with hypoxia: Secondary | ICD-10-CM | POA: Diagnosis not present

## 2021-01-04 LAB — BASIC METABOLIC PANEL
Anion gap: 13 (ref 5–15)
BUN: 14 mg/dL (ref 8–23)
CO2: 26 mmol/L (ref 22–32)
Calcium: 9.2 mg/dL (ref 8.9–10.3)
Chloride: 102 mmol/L (ref 98–111)
Creatinine, Ser: 0.82 mg/dL (ref 0.44–1.00)
GFR, Estimated: >60 mL/min (ref >60)
Glucose, Bld: 165 mg/dL — ABNORMAL HIGH (ref 70–99)
Potassium: 3.5 mmol/L (ref 3.5–5.1)
Sodium: 141 mmol/L (ref 135–145)

## 2021-01-04 LAB — BLOOD GAS, ARTERIAL
Acid-Base Excess: 4.9 mmol/L — ABNORMAL HIGH (ref 0.0–2.0)
Bicarbonate: 29.6 mmol/L — ABNORMAL HIGH (ref 20.0–28.0)
Drawn by: 51133
FIO2: 35
O2 Saturation: 97.1 %
Patient temperature: 36.5
pCO2 arterial: 48 mmHg (ref 32.0–48.0)
pH, Arterial: 7.404 (ref 7.350–7.450)
pO2, Arterial: 85.5 mmHg (ref 83.0–108.0)

## 2021-01-04 LAB — CBC
HCT: 34.2 % — ABNORMAL LOW (ref 36.0–46.0)
Hemoglobin: 10.4 g/dL — ABNORMAL LOW (ref 12.0–15.0)
MCH: 28.6 pg (ref 26.0–34.0)
MCHC: 30.4 g/dL (ref 30.0–36.0)
MCV: 94 fL (ref 80.0–100.0)
Platelets: 273 10*3/uL (ref 150–400)
RBC: 3.64 MIL/uL — ABNORMAL LOW (ref 3.87–5.11)
RDW: 14.4 % (ref 11.5–15.5)
WBC: 10.4 10*3/uL (ref 4.0–10.5)
nRBC: 0 % (ref 0.0–0.2)

## 2021-01-04 LAB — PROCALCITONIN: Procalcitonin: 5.97 ng/mL

## 2021-01-04 MED ORDER — CHLORHEXIDINE GLUCONATE CLOTH 2 % EX PADS
6.0000 | MEDICATED_PAD | Freq: Every day | CUTANEOUS | Status: DC
  Administered 2021-01-04 – 2021-01-09 (×5): 6 via TOPICAL

## 2021-01-04 MED ORDER — ALBUTEROL SULFATE (2.5 MG/3ML) 0.083% IN NEBU
INHALATION_SOLUTION | RESPIRATORY_TRACT | Status: AC
  Filled 2021-01-04: qty 12

## 2021-01-04 MED ORDER — DM-GUAIFENESIN ER 30-600 MG PO TB12
1.0000 | ORAL_TABLET | Freq: Two times a day (BID) | ORAL | Status: DC | PRN
  Administered 2021-01-09: 1 via ORAL
  Filled 2021-01-04 (×2): qty 1

## 2021-01-04 MED ORDER — ENOXAPARIN SODIUM 300 MG/3ML IJ SOLN
20.0000 mg | INTRAMUSCULAR | Status: DC
  Administered 2021-01-05 – 2021-01-09 (×5): 20 mg via SUBCUTANEOUS
  Filled 2021-01-04 (×5): qty 0.2

## 2021-01-04 MED ORDER — IPRATROPIUM-ALBUTEROL 0.5-2.5 (3) MG/3ML IN SOLN
3.0000 mL | RESPIRATORY_TRACT | Status: DC | PRN
  Administered 2021-01-07: 3 mL via RESPIRATORY_TRACT

## 2021-01-04 MED ORDER — SENNOSIDES-DOCUSATE SODIUM 8.6-50 MG PO TABS: 1.0000 | ORAL_TABLET | Freq: Every evening | ORAL | Status: DC | PRN

## 2021-01-04 MED ORDER — ALBUTEROL SULFATE (2.5 MG/3ML) 0.083% IN NEBU
10.0000 mg | INHALATION_SOLUTION | Freq: Once | RESPIRATORY_TRACT | Status: AC
  Administered 2021-01-04: 10 mg via RESPIRATORY_TRACT

## 2021-01-04 NOTE — ED Notes (Signed)
SDU Breakfast Ordered 

## 2021-01-04 NOTE — Progress Notes (Signed)
Pt placed on BiPAP due to increased WOB.

## 2021-01-04 NOTE — ED Notes (Signed)
Attempted report x1. 

## 2021-01-04 NOTE — Progress Notes (Signed)
Pt had a onset of exacerbation.  Patients work of breathing and heart rate increased as well as her blood pressure.  Sp02 was 93% RN placed patient on BIPAP to assist her with wob and sob. Patient is slowly calming down and regaining control of her breathing.

## 2021-01-04 NOTE — Progress Notes (Signed)
PROGRESS NOTE    Donna Curry  VQQ:[id number] DOB: 1955/12/25 DOA: 01/03/2021 PCP: [name], NP   Brief Narrative:  65 year old with history of COPD, tobacco use, HTN, bladder cancer on immunotherapy, anemia admitted for COPD exacerbation.  COVID-19 negative.   Assessment & Plan:   Principal Problem:   Acute respiratory failure with hypoxia (HCC) Active Problems:   Essential hypertension   Bladder cancer (HCC)   COPD with acute exacerbation (HCC)   Acute respiratory failure with hypoxemia (HCC)  Acute respiratory failure with hypoxia, requiring BiPAP Acute COPD exacerbation -BiPAP weaned off slowly - Chest x-ray negative - Solu-Medrol IV every 12 - Antibiotic-Rocephin.  Check Pro-Cal - Bronchodilators scheduled and as needed - I-S/flutter  Essential hypertension - On Norvasc.  IV hydralazine as needed  History of bladder cancer - On immunotherapy.  Anemia of chronic disease - Hemoglobin stable without any active signs of bleeding  Chronic pain - On oxycodone.  As needed stool softener  Depression/anxiety - On citalopram   DVT prophylaxis: Lovenox Code Status: Full code Family Communication:    Status is: Inpatient  Remains inpatient appropriate because:Inpatient level of care appropriate due to severity of illness.  Severely abnormal breath sounds  Dispo: The patient is from: Home              Anticipated d/c is to: Home              Patient currently is not medically stable to d/c.   Difficult to place patient No       Nutritional status           Body mass index is 12.05 kg/m.           Subjective: Feels better today compared to yesterday.  Still on BiPAP but overall feeling better therefore requesting trial off BiPAP.  Review of Systems Otherwise negative except as per HPI, including: General: Denies fever, chills, night sweats or unintended weight loss. Resp: Denies cough, wheezing, shortness of  breath. Cardiac: Denies chest pain, palpitations, orthopnea, paroxysmal nocturnal dyspnea. GI: Denies abdominal pain, nausea, vomiting, diarrhea or constipation GU: Denies dysuria, frequency, hesitancy or incontinence MS: Denies muscle aches, joint pain or swelling Neuro: Denies headache, neurologic deficits (focal weakness, numbness, tingling), abnormal gait Psych: Denies anxiety, depression, SI/HI/AVH Skin: Denies new rashes or lesions ID: Denies sick contacts, exotic exposures, travel  Examination:  General exam: Appears calm and comfortable, on BiPAP Respiratory system: Bilateral coarse breath sounds Cardiovascular system: S1 & S2 heard, RRR. No JVD, murmurs, rubs, gallops or clicks. No pedal edema. Gastrointestinal system: Abdomen is nondistended, soft and nontender. No organomegaly or masses felt. Normal bowel sounds heard. Central nervous system: Alert and oriented. No focal neurological deficits. Extremities: Symmetric 5 x 5 power. Skin: No rashes, lesions or ulcers Psychiatry: Judgement and insight appear normal. Mood & affect appropriate.     Objective: Vitals:   01/04/21 0700 01/04/21 0715 01/04/21 0730 01/04/21 0745  BP: (!) 183/93 (!) 150/87 (!) 153/84 (!) 152/92  Pulse: (!) 121 (!) 112  (!) 109  Resp: (!) 22 13 (!) 22 17  Temp:    98.3 F (36.8 C)  TempSrc:    Axillary  SpO2: 100% 100% 100% 100%  Weight:       No intake or output data in the 24 hours ending 01/04/21 0852 Filed Weights   01/03/21 1838  Weight: 29.9 kg     Data Reviewed:   CBC: Recent Labs  Lab 01/03/21 1837 01/03/21 1858  01/03/21 2058 01/04/21 0500  WBC 16.0*  --   --  10.4  NEUTROABS 13.1*  --   --   --   HGB 11.8* 12.6 11.6* 10.4*  HCT 37.8 37.0 34.0* 34.2*  MCV 94.0  --   --  94.0  PLT 313  --   --  237   Basic Metabolic Panel: Recent Labs  Lab 01/03/21 1837 01/03/21 1858 01/03/21 2058 01/04/21 0500  NA 141 142 142 141  K 3.8 3.7 3.3* 3.5  CL 102 103  --  102  CO2 29   --   --  26  GLUCOSE 179* 180*  --  165*  BUN 13 13  --  14  CREATININE 0.77 0.70  --  0.82  CALCIUM 9.1  --   --  9.2   GFR: Estimated Creatinine Clearance: 32.7 mL/min (by C-G formula based on SCr of 0.82 mg/dL). Liver Function Tests: Recent Labs  Lab 01/03/21 1837  AST 24  ALT 16  ALKPHOS 90  BILITOT 0.4  PROT 6.9  ALBUMIN 3.2*   No results for input(s): LIPASE, AMYLASE in the last 168 hours. No results for input(s): AMMONIA in the last 168 hours. Coagulation Profile: No results for input(s): INR, PROTIME in the last 168 hours. Cardiac Enzymes: No results for input(s): CKTOTAL, CKMB, CKMBINDEX, TROPONINI in the last 168 hours. BNP (last 3 results) No results for input(s): PROBNP in the last 8760 hours. HbA1C: No results for input(s): HGBA1C in the last 72 hours. CBG: No results for input(s): GLUCAP in the last 168 hours. Lipid Profile: No results for input(s): CHOL, HDL, LDLCALC, TRIG, CHOLHDL, LDLDIRECT in the last 72 hours. Thyroid Function Tests: No results for input(s): TSH, T4TOTAL, FREET4, T3FREE, THYROIDAB in the last 72 hours. Anemia Panel: No results for input(s): VITAMINB12, FOLATE, FERRITIN, TIBC, IRON, RETICCTPCT in the last 72 hours. Sepsis Labs: No results for input(s): PROCALCITON, LATICACIDVEN in the last 168 hours.  Recent Results (from the past 240 hour(s))  Resp Panel by RT-PCR (Flu A&B, Covid) Nasopharyngeal Swab     Status: None   Collection Time: 01/03/21  6:45 PM   Specimen: Nasopharyngeal Swab; Nasopharyngeal(NP) swabs in vial transport medium  Result Value Ref Range Status   SARS Coronavirus 2 by RT PCR NEGATIVE NEGATIVE Final    Comment: (NOTE) SARS-CoV-2 target nucleic acids are NOT DETECTED.  The SARS-CoV-2 RNA is generally detectable in upper respiratory specimens during the acute phase of infection. The lowest concentration of SARS-CoV-2 viral copies this assay can detect is 138 copies/mL. A negative result does not preclude  SARS-Cov-2 infection and should not be used as the sole basis for treatment or other patient management decisions. A negative result may occur with  improper specimen collection/handling, submission of specimen other than nasopharyngeal swab, presence of viral mutation(s) within the areas targeted by this assay, and inadequate number of viral copies(<138 copies/mL). A negative result must be combined with clinical observations, patient history, and epidemiological information. The expected result is Negative.  Fact Sheet for Patients:  EntrepreneurPulse.com.au  Fact Sheet for Healthcare Providers:  IncredibleEmployment.be  This test is no t yet approved or cleared by the [place] FDA and  has been authorized for detection and/or diagnosis of SARS-CoV-2 by FDA under an Emergency Use Authorization (EUA). This EUA will remain  in effect (meaning this test can be used) for the duration of the COVID-19 declaration under Section 564(b)(1) of the Act, 21 U.S.C.section 360bbb-3(b)(1), unless the authorization is terminated  or revoked sooner.       Influenza A by PCR NEGATIVE NEGATIVE Final   Influenza B by PCR NEGATIVE NEGATIVE Final    Comment: (NOTE) The Xpert Xpress SARS-CoV-2/FLU/RSV plus assay is intended as an aid in the diagnosis of influenza from Nasopharyngeal swab specimens and should not be used as a sole basis for treatment. Nasal washings and aspirates are unacceptable for Xpert Xpress SARS-CoV-2/FLU/RSV testing.  Fact Sheet for Patients: EntrepreneurPulse.com.au  Fact Sheet for Healthcare Providers: IncredibleEmployment.be  This test is not yet approved or cleared by the [place] FDA and has been authorized for detection and/or diagnosis of SARS-CoV-2 by FDA under an Emergency Use Authorization (EUA). This EUA will remain in effect (meaning this test can be used) for the duration of  the COVID-19 declaration under Section 564(b)(1) of the Act, 21 U.S.C. section 360bbb-3(b)(1), unless the authorization is terminated or revoked.  Performed at [facility], [place] [address] [place], [place] [address]          Radiology Studies: DG Chest Portable 1 View  Result Date: 01/03/2021 CLINICAL DATA:  Shortness of breath EXAM: PORTABLE CHEST 1 VIEW COMPARISON:  September 22, 2020 FINDINGS: Incomplete assessment of the RIGHT lateral soft tissues and RIGHT costophrenic angle. The cardiomediastinal silhouette is unchanged in contour.Atherosclerotic calcifications. RIGHT chest port with tip terminating over the superior cavoatrial junction. Hyperinflation. Emphysematous changes. No pleural effusion. No pneumothorax. No acute pleuroparenchymal abnormality. Visualized abdomen is unremarkable. Multilevel degenerative changes of the thoracic spine. IMPRESSION: No acute cardiopulmonary abnormality. Electronically Signed   By: Valentino Saxon MD   On: 01/03/2021 18:56        Scheduled Meds:  amLODipine  10 mg Oral Daily   aspirin EC  81 mg Oral Daily   budesonide (PULMICORT) nebulizer solution  0.25 mg Nebulization BID   citalopram  10 mg Oral Daily   enoxaparin (LOVENOX) injection  40 mg Subcutaneous Q24H   feeding supplement  237 mL Oral BID BM   ipratropium-albuterol  3 mL Nebulization Q4H   methylPREDNISolone (SOLU-MEDROL) injection  40 mg Intravenous Q12H   Continuous Infusions:  cefTRIAXone (ROCEPHIN)  IV       LOS: 1 day   Time spent= 35 mins    Avital Dancy Arsenio Loader, MD [facility]  If 7PM-7AM, please contact night-coverage  01/04/2021, 8:52 AM

## 2021-01-04 NOTE — Progress Notes (Signed)
Pt transported on BiPAP from ED to 2W24 without any complications. Vitals stable, RN at beside, RT will continue to monitor.

## 2021-01-04 NOTE — Progress Notes (Signed)
RT NOTES: Removed patient from bipap and placed on 4lpm nasal cannula per MD. Will continue to monitor.

## 2021-01-04 NOTE — Progress Notes (Signed)
Pt was taken off BiPAP and placed on 3L [place]. Vitals stable RN aware RT will continue to monitor.

## 2021-01-04 NOTE — Progress Notes (Addendum)
Pt developed acute exacerbation of COPD. On observation pt had increased WOB, was tripoding, anxiety, elevated HR and elevated BP. Pt had not been engaged in any physical activity before this event, 10 mg hydralazine had been given @ 2046 for HTN.   Placed on BiPap to assist w/ WOB. RT called and MD paged. Awaiting call from MD. Pt now calming down and has verbalized improving breathing on Bipap. HR: 150, SO2: 100, BP 197/140, RR: 28. Will continue to closely monitor.   Update 2119: MD orders placed for ABG and to keep pt on BiPap overnight. Pt now resting comfortably and reports no SOB. VSS.

## 2021-01-04 NOTE — Progress Notes (Signed)
Pt trialed of bipap again at this time without success following duoneb treatment.  Pt placed on 3LPM [place] but Pt became "hot" and requested a fan, then had increased WOB.  RT placed pt back on Bipap.  Pt is stating she feels "tight" in her chest preventing her from taking a deep breathe.  Volumes on bipap are acceptable and pt appears more comfortable on device.  RT will continue to monitor.

## 2021-01-05 ENCOUNTER — Other Ambulatory Visit: Payer: Medicare Other

## 2021-01-05 ENCOUNTER — Ambulatory Visit (HOSPITAL_BASED_OUTPATIENT_CLINIC_OR_DEPARTMENT_OTHER): Payer: Medicare Other

## 2021-01-05 ENCOUNTER — Ambulatory Visit: Payer: Medicare Other

## 2021-01-05 ENCOUNTER — Ambulatory Visit: Payer: Medicare Other | Admitting: Hematology & Oncology

## 2021-01-05 DIAGNOSIS — J9601 Acute respiratory failure with hypoxia: Secondary | ICD-10-CM | POA: Diagnosis not present

## 2021-01-05 LAB — CBC
HCT: 35.9 % — ABNORMAL LOW (ref 36.0–46.0)
Hemoglobin: 11.2 g/dL — ABNORMAL LOW (ref 12.0–15.0)
MCH: 28.8 pg (ref 26.0–34.0)
MCHC: 31.2 g/dL (ref 30.0–36.0)
MCV: 92.3 fL (ref 80.0–100.0)
Platelets: 259 10*3/uL (ref 150–400)
RBC: 3.89 MIL/uL (ref 3.87–5.11)
RDW: 14.9 % (ref 11.5–15.5)
WBC: 12.7 10*3/uL — ABNORMAL HIGH (ref 4.0–10.5)
nRBC: 0 % (ref 0.0–0.2)

## 2021-01-05 LAB — BASIC METABOLIC PANEL
Anion gap: 8 (ref 5–15)
BUN: 20 mg/dL (ref 8–23)
CO2: 32 mmol/L (ref 22–32)
Calcium: 9.5 mg/dL (ref 8.9–10.3)
Chloride: 100 mmol/L (ref 98–111)
Creatinine, Ser: 0.8 mg/dL (ref 0.44–1.00)
GFR, Estimated: >60 mL/min (ref >60)
Glucose, Bld: 115 mg/dL — ABNORMAL HIGH (ref 70–99)
Potassium: 4.1 mmol/L (ref 3.5–5.1)
Sodium: 140 mmol/L (ref 135–145)

## 2021-01-05 LAB — BRAIN NATRIURETIC PEPTIDE: B Natriuretic Peptide: 97.2 pg/mL (ref 0.0–100.0)

## 2021-01-05 LAB — MAGNESIUM: Magnesium: 2.2 mg/dL (ref 1.7–2.4)

## 2021-01-05 LAB — GLUCOSE, CAPILLARY: Glucose-Capillary: 103 mg/dL — ABNORMAL HIGH (ref 70–99)

## 2021-01-05 MED ORDER — LORAZEPAM 2 MG/ML IJ SOLN
1.0000 mg | Freq: Once | INTRAMUSCULAR | Status: DC | PRN
  Filled 2021-01-05: qty 1

## 2021-01-05 MED ORDER — LORAZEPAM 2 MG/ML IJ SOLN
0.5000 mg | Freq: Four times a day (QID) | INTRAMUSCULAR | Status: DC | PRN
  Administered 2021-01-05 (×2): 0.5 mg via INTRAVENOUS
  Filled 2021-01-05 (×2): qty 1

## 2021-01-05 MED ORDER — SODIUM CHLORIDE 0.9% FLUSH
10.0000 mL | Freq: Two times a day (BID) | INTRAVENOUS | Status: DC
  Administered 2021-01-05: 30 mL
  Administered 2021-01-06 – 2021-01-09 (×6): 10 mL

## 2021-01-05 MED ORDER — SODIUM CHLORIDE 0.9% FLUSH
10.0000 mL | INTRAVENOUS | Status: DC | PRN
  Administered 2021-01-05 – 2021-01-07 (×2): 10 mL

## 2021-01-05 MED ORDER — IPRATROPIUM-ALBUTEROL 0.5-2.5 (3) MG/3ML IN SOLN
3.0000 mL | Freq: Three times a day (TID) | RESPIRATORY_TRACT | Status: DC
  Administered 2021-01-05 – 2021-01-07 (×6): 3 mL via RESPIRATORY_TRACT
  Filled 2021-01-05 (×6): qty 3

## 2021-01-05 MED ORDER — AZITHROMYCIN 250 MG PO TABS
500.0000 mg | ORAL_TABLET | Freq: Every day | ORAL | Status: DC
  Administered 2021-01-05 – 2021-01-06 (×2): 500 mg via ORAL
  Filled 2021-01-05 (×2): qty 2

## 2021-01-05 NOTE — Progress Notes (Signed)
PROGRESS NOTE    Donna Curry  RBK:[id number] DOB: Oct 11, 1955 DOA: 01/03/2021 PCP: Sandford Craze, NP   Brief Narrative:  65 year old with history of COPD, tobacco use, HTN, bladder cancer on immunotherapy, anemia admitted for COPD exacerbation.  COVID-19 negative.  She is requiring BiPAP off and on, there is a component of anxiety.   Assessment & Plan:   Principal Problem:   Acute respiratory failure with hypoxia (HCC) Active Problems:   Essential hypertension   Bladder cancer (HCC)   COPD with acute exacerbation (HCC)   Acute respiratory failure with hypoxemia (HCC)  Acute respiratory failure with hypoxia, requiring BiPAP Acute COPD exacerbation - Still intermittently requiring BiPAP.  Chest x-ray is negative - Solu-Medrol IV every 12 hours. - Antibiotic-Rocephin, azithromycin.  Pro-Cal 5.9 - Bronchodilators scheduled and as needed - I-S/flutter  Essential hypertension - On Norvasc.  IV hydralazine as needed  History of bladder cancer - On immunotherapy.  Anemia of chronic disease - Hemoglobin stable without any active signs of bleeding  Chronic pain - On oxycodone.  As needed stool softener  Depression/anxiety - On citalopram.  As needed Ativan   DVT prophylaxis: Lovenox Code Status: Full code Family Communication: Met with her sister at bedside yesterday afternoon.  Status is: Inpatient  Remains inpatient appropriate because:Inpatient level of care appropriate due to severity of illness.  Severely abnormal breath sounds requiring intermittent BiPAP.  Dispo: The patient is from: Home              Anticipated d/c is to: Home              Patient currently is not medically stable to d/c.   Difficult to place patient No    Subjective: Overnight became very anxious and felt short of breath therefore required Ativan and had to be placed on BiPAP. During my visit she appeared more calm but had exertional dyspnea.  Tolerating  BiPAP.    Examination: Constitutional: Not in acute distress, cachectic frail on BiPAP Respiratory: Bilateral coarse breath sounds Cardiovascular: Normal sinus rhythm, no rubs Abdomen: Nontender nondistended good bowel sounds Musculoskeletal: No edema noted Skin: No rashes seen Neurologic: CN 2-12 grossly intact.  And nonfocal Psychiatric: Normal judgment and insight. Alert and oriented x 3. Normal mood.   Objective: Vitals:   01/05/21 0010 01/05/21 0340 01/05/21 0405 01/05/21 0514  BP: (!) 155/95  (!) 152/99 (!) 171/96  Pulse:    (!) 108  Resp: 18  17 18   Temp: 97.9 F (36.6 C)  98.2 F (36.8 C) 98.1 F (36.7 C)  TempSrc: Oral  Oral Axillary  SpO2:  100% 100% 100%  Weight:      Height:        Intake/Output Summary (Last 24 hours) at 01/05/2021 0736 Last data filed at 01/05/2021 0530 Gross per 24 hour  Intake --  Output 900 ml  Net -900 ml   Filed Weights   01/03/21 1838 01/04/21 1236  Weight: 29.9 kg 29.9 kg     Data Reviewed:   CBC: Recent Labs  Lab 01/03/21 1837 01/03/21 1858 01/03/21 2058 01/04/21 0500 01/05/21 0100  WBC 16.0*  --   --  10.4 12.7*  NEUTROABS 13.1*  --   --   --   --   HGB 11.8* 12.6 11.6* 10.4* 11.2*  HCT 37.8 37.0 34.0* 34.2* 35.9*  MCV 94.0  --   --  94.0 92.3  PLT 313  --   --  273 259   Basic Metabolic  Panel: Recent Labs  Lab 01/03/21 1837 01/03/21 1858 01/03/21 2058 01/04/21 0500 01/05/21 0100  NA 141 142 142 141 140  K 3.8 3.7 3.3* 3.5 4.1  CL 102 103  --  102 100  CO2 29  --   --  26 32  GLUCOSE 179* 180*  --  165* 115*  BUN 13 13  --  14 20  CREATININE 0.77 0.70  --  0.82 0.80  CALCIUM 9.1  --   --  9.2 9.5  MG  --   --   --   --  2.2   GFR: Estimated Creatinine Clearance: 33.5 mL/min (by C-G formula based on SCr of 0.8 mg/dL). Liver Function Tests: Recent Labs  Lab 01/03/21 1837  AST 24  ALT 16  ALKPHOS 90  BILITOT 0.4  PROT 6.9  ALBUMIN 3.2*   No results for input(s): LIPASE, AMYLASE in the last  168 hours. No results for input(s): AMMONIA in the last 168 hours. Coagulation Profile: No results for input(s): INR, PROTIME in the last 168 hours. Cardiac Enzymes: No results for input(s): CKTOTAL, CKMB, CKMBINDEX, TROPONINI in the last 168 hours. BNP (last 3 results) No results for input(s): PROBNP in the last 8760 hours. HbA1C: No results for input(s): HGBA1C in the last 72 hours. CBG: No results for input(s): GLUCAP in the last 168 hours. Lipid Profile: No results for input(s): CHOL, HDL, LDLCALC, TRIG, CHOLHDL, LDLDIRECT in the last 72 hours. Thyroid Function Tests: No results for input(s): TSH, T4TOTAL, FREET4, T3FREE, THYROIDAB in the last 72 hours. Anemia Panel: No results for input(s): VITAMINB12, FOLATE, FERRITIN, TIBC, IRON, RETICCTPCT in the last 72 hours. Sepsis Labs: Recent Labs  Lab 01/04/21 1258  PROCALCITON 5.97    Recent Results (from the past 240 hour(s))  Resp Panel by RT-PCR (Flu A&B, Covid) Nasopharyngeal Swab     Status: None   Collection Time: 01/03/21  6:45 PM   Specimen: Nasopharyngeal Swab; Nasopharyngeal(NP) swabs in vial transport medium  Result Value Ref Range Status   SARS Coronavirus 2 by RT PCR NEGATIVE NEGATIVE Final    Comment: (NOTE) SARS-CoV-2 target nucleic acids are NOT DETECTED.  The SARS-CoV-2 RNA is generally detectable in upper respiratory specimens during the acute phase of infection. The lowest concentration of SARS-CoV-2 viral copies this assay can detect is 138 copies/mL. A negative result does not preclude SARS-Cov-2 infection and should not be used as the sole basis for treatment or other patient management decisions. A negative result may occur with  improper specimen collection/handling, submission of specimen other than nasopharyngeal swab, presence of viral mutation(s) within the areas targeted by this assay, and inadequate number of viral copies(<138 copies/mL). A negative result must be combined with clinical  observations, patient history, and epidemiological information. The expected result is Negative.  Fact Sheet for Patients:  EntrepreneurPulse.com.au  Fact Sheet for Healthcare Providers:  IncredibleEmployment.be  This test is no t yet approved or cleared by the [place] FDA and  has been authorized for detection and/or diagnosis of SARS-CoV-2 by FDA under an Emergency Use Authorization (EUA). This EUA will remain  in effect (meaning this test can be used) for the duration of the COVID-19 declaration under Section 564(b)(1) of the Act, 21 U.S.C.section 360bbb-3(b)(1), unless the authorization is terminated  or revoked sooner.       Influenza A by PCR NEGATIVE NEGATIVE Final   Influenza B by PCR NEGATIVE NEGATIVE Final    Comment: (NOTE) The Xpert Xpress SARS-CoV-2/FLU/RSV plus assay  is intended as an aid in the diagnosis of influenza from Nasopharyngeal swab specimens and should not be used as a sole basis for treatment. Nasal washings and aspirates are unacceptable for Xpert Xpress SARS-CoV-2/FLU/RSV testing.  Fact Sheet for Patients: EntrepreneurPulse.com.au  Fact Sheet for Healthcare Providers: IncredibleEmployment.be  This test is not yet approved or cleared by the [place] FDA and has been authorized for detection and/or diagnosis of SARS-CoV-2 by FDA under an Emergency Use Authorization (EUA). This EUA will remain in effect (meaning this test can be used) for the duration of the COVID-19 declaration under Section 564(b)(1) of the Act, 21 U.S.C. section 360bbb-3(b)(1), unless the authorization is terminated or revoked.  Performed at [facility], [place] [address] [place], [place] [address]          Radiology Studies: DG Chest Portable 1 View  Result Date: 01/03/2021 CLINICAL DATA:  Shortness of breath EXAM: PORTABLE CHEST 1 VIEW COMPARISON:  September 22, 2020 FINDINGS: Incomplete  assessment of the RIGHT lateral soft tissues and RIGHT costophrenic angle. The cardiomediastinal silhouette is unchanged in contour.Atherosclerotic calcifications. RIGHT chest port with tip terminating over the superior cavoatrial junction. Hyperinflation. Emphysematous changes. No pleural effusion. No pneumothorax. No acute pleuroparenchymal abnormality. Visualized abdomen is unremarkable. Multilevel degenerative changes of the thoracic spine. IMPRESSION: No acute cardiopulmonary abnormality. Electronically Signed   By: Valentino Saxon MD   On: 01/03/2021 18:56        Scheduled Meds:  amLODipine  10 mg Oral Daily   aspirin EC  81 mg Oral Daily   budesonide (PULMICORT) nebulizer solution  0.25 mg Nebulization BID   Chlorhexidine Gluconate Cloth  6 each Topical Daily   citalopram  10 mg Oral Daily   enoxaparin (LOVENOX) injection  20 mg Subcutaneous Q24H   feeding supplement  237 mL Oral BID BM   ipratropium-albuterol  3 mL Nebulization Q4H   methylPREDNISolone (SOLU-MEDROL) injection  40 mg Intravenous Q12H   Continuous Infusions:  cefTRIAXone (ROCEPHIN)  IV 1 g (01/04/21 2000)     LOS: 2 days   Time spent= 35 mins    Johnhenry Tippin Arsenio Loader, MD [facility]  If 7PM-7AM, please contact night-coverage  01/05/2021, 7:36 AM

## 2021-01-05 NOTE — Progress Notes (Signed)
Patient voided 200 ml. Bladder scan showed 78 ml in bladder.

## 2021-01-05 NOTE — Progress Notes (Signed)
Pt observed to have elevated HR in the 140s. Spoke to pt and she was reporting anxiety related to urinary retention. Bladder scan showed 446ml. MD paged, orders for I&O cath along with Q4 bladder scan. I&O cath performed with 550 mL output. Pt reporting relief, HR decreasing, currently 116. Will continue to monitor.

## 2021-01-05 NOTE — Plan of Care (Signed)

## 2021-01-06 ENCOUNTER — Encounter (HOSPITAL_COMMUNITY): Payer: Self-pay | Admitting: Internal Medicine

## 2021-01-06 DIAGNOSIS — J441 Chronic obstructive pulmonary disease with (acute) exacerbation: Secondary | ICD-10-CM | POA: Diagnosis not present

## 2021-01-06 DIAGNOSIS — I10 Essential (primary) hypertension: Secondary | ICD-10-CM | POA: Diagnosis not present

## 2021-01-06 DIAGNOSIS — J9601 Acute respiratory failure with hypoxia: Secondary | ICD-10-CM | POA: Diagnosis not present

## 2021-01-06 LAB — BASIC METABOLIC PANEL
Anion gap: 9 (ref 5–15)
BUN: 32 mg/dL — ABNORMAL HIGH (ref 8–23)
CO2: 36 mmol/L — ABNORMAL HIGH (ref 22–32)
Calcium: 9.1 mg/dL (ref 8.9–10.3)
Chloride: 96 mmol/L — ABNORMAL LOW (ref 98–111)
Creatinine, Ser: 0.81 mg/dL (ref 0.44–1.00)
GFR, Estimated: >60 mL/min (ref >60)
Glucose, Bld: 123 mg/dL — ABNORMAL HIGH (ref 70–99)
Potassium: 4.9 mmol/L (ref 3.5–5.1)
Sodium: 141 mmol/L (ref 135–145)

## 2021-01-06 LAB — CBC
HCT: 33.5 % — ABNORMAL LOW (ref 36.0–46.0)
Hemoglobin: 10.5 g/dL — ABNORMAL LOW (ref 12.0–15.0)
MCH: 29.1 pg (ref 26.0–34.0)
MCHC: 31.3 g/dL (ref 30.0–36.0)
MCV: 92.8 fL (ref 80.0–100.0)
Platelets: 272 10*3/uL (ref 150–400)
RBC: 3.61 MIL/uL — ABNORMAL LOW (ref 3.87–5.11)
RDW: 14.9 % (ref 11.5–15.5)
WBC: 11 10*3/uL — ABNORMAL HIGH (ref 4.0–10.5)
nRBC: 0 % (ref 0.0–0.2)

## 2021-01-06 LAB — MAGNESIUM: Magnesium: 2.2 mg/dL (ref 1.7–2.4)

## 2021-01-06 NOTE — Care Management Important Message (Signed)
Important Message  Patient Details  Name: Donna Curry MRN: [id number] Date of Birth: April 28, 1956   Medicare Important Message Given:  Yes     Orbie Pyo 01/06/2021, 11:55 AM

## 2021-01-06 NOTE — Progress Notes (Signed)
Progress Note    [name]  BDZ:[id number] DOB: 09-Jul-1956  DOA: 01/03/2021 PCP: [name], NP      Brief Narrative:    Medical records reviewed and are as summarized below:  Donna Curry is a 65 y.o. female with medical history significant for COPD, chronic hypoxic respiratory failure on 2 L/min oxygen at home, tobacco use disorder, hypertension, bladder cancer on immunotherapy, chronic anemia, who presented to the hospital with nonproductive cough and shortness of breath.      Assessment/Plan:   Principal Problem:   Acute respiratory failure with hypoxia (HCC) Active Problems:   Essential hypertension   Bladder cancer (HCC)   COPD with acute exacerbation (HCC)   Acute respiratory failure with hypoxemia (HCC)    Body mass index is 10.97 kg/m.  (Underweight/cachexia)   COPD exacerbation: Continue empiric IV antibiotics, IV steroids and bronchodilators.  Procalcitonin was 5.9.  Acute on chronic hypoxic respiratory failure: She is requiring BiPAP.  Wean off BiPAP as able to oxygen via nasal cannula.  She is on 2 L/min oxygen at home.  Hypertension: Continue amlodipine  Other comorbidities include depression, anxiety, chronic pain, anemia of chronic disease, history of bladder cancer on immunotherapy.   Diet Order             Diet Heart Room service appropriate? Yes; Fluid consistency: Thin  Diet effective now                      Consultants: None  Procedures: None    Medications:    amLODipine  10 mg Oral Daily   aspirin EC  81 mg Oral Daily   azithromycin  500 mg Oral Daily   budesonide (PULMICORT) nebulizer solution  0.25 mg Nebulization BID   Chlorhexidine Gluconate Cloth  6 each Topical Daily   citalopram  10 mg Oral Daily   enoxaparin (LOVENOX) injection  20 mg Subcutaneous Q24H   feeding supplement  237 mL Oral BID BM   ipratropium-albuterol  3 mL Nebulization TID   methylPREDNISolone (SOLU-MEDROL) injection   40 mg Intravenous Q12H   sodium chloride flush  10-40 mL Intracatheter Q12H   Continuous Infusions:  cefTRIAXone (ROCEPHIN)  IV 1 g (01/05/21 2025)     Anti-infectives (From admission, onward)    Start     Dose/Rate Route Frequency Ordered Stop   01/05/21 1000  azithromycin (ZITHROMAX) tablet 500 mg        500 mg Oral Daily 01/05/21 0737 01/10/21 0959   01/04/21 2000  cefTRIAXone (ROCEPHIN) 1 g in sodium chloride 0.9 % 100 mL IVPB        1 g 200 mL/hr over 30 Minutes Intravenous Every 24 hours 01/03/21 2244 01/09/21 1959   01/03/21 2015  azithromycin (ZITHROMAX) 500 mg in sodium chloride 0.9 % 250 mL IVPB        500 mg 250 mL/hr over 60 Minutes Intravenous  Once 01/03/21 2007 01/03/21 2258   01/03/21 2015  cefTRIAXone (ROCEPHIN) 1 g in sodium chloride 0.9 % 100 mL IVPB        1 g 200 mL/hr over 30 Minutes Intravenous  Once 01/03/21 2007 01/03/21 2258              Family Communication/Anticipated D/C date and plan/Code Status   DVT prophylaxis:      Code Status: Full Code  Family Communication: None Disposition Plan:    Status is: Inpatient  Remains inpatient appropriate because:Inpatient level of  care appropriate due to severity of illness  Dispo: The patient is from: Home              Anticipated d/c is to: Home              Patient currently is not medically stable to d/c.   Difficult to place patient No           Subjective:   Interval events noted.  She complains of shortness of breath.  Objective:    Vitals:   01/05/21 2356 01/06/21 0818 01/06/21 0904 01/06/21 1206  BP: (!) 163/106 (!) 165/105 (!) 158/92 113/90  Pulse: (!) 104 90 (!) 107   Resp: 17 (!) 23 19 14   Temp:  97.9 F (36.6 C) 97.9 F (36.6 C) 97.9 F (36.6 C)  TempSrc:  Axillary Oral Axillary  SpO2:  100% 100%   Weight:      Height:       No data found.   Intake/Output Summary (Last 24 hours) at 01/06/2021 1221 Last data filed at 01/06/2021 0824 Gross per 24 hour   Intake 447 ml  Output --  Net 447 ml   Filed Weights   01/03/21 1838 01/04/21 1236  Weight: 29.9 kg 29.9 kg    Exam:  GEN: NAD, cachetic SKIN: No rash EYES: EOMI ENT: MMM CV: RRR, tachycardic PULM: Decreased air entry bilaterally, mild expiratory wheezing bilaterally.  She is on BiPAP ABD: soft, ND, NT, +BS CNS: AAO x 3, non focal EXT: No edema or tenderness        Data Reviewed:   I have personally reviewed following labs and imaging studies:  Labs: Labs show the following:   Basic Metabolic Panel: Recent Labs  Lab 01/03/21 1837 01/03/21 1858 01/03/21 2058 01/04/21 0500 01/05/21 0100 01/06/21 0500  NA 141 142 142 141 140 141  K 3.8 3.7 3.3* 3.5 4.1 4.9  CL 102 103  --  102 100 96*  CO2 29  --   --  26 32 36*  GLUCOSE 179* 180*  --  165* 115* 123*  BUN 13 13  --  14 20 32*  CREATININE 0.77 0.70  --  0.82 0.80 0.81  CALCIUM 9.1  --   --  9.2 9.5 9.1  MG  --   --   --   --  2.2 2.2   GFR Estimated Creatinine Clearance: 33.1 mL/min (by C-G formula based on SCr of 0.81 mg/dL). Liver Function Tests: Recent Labs  Lab 01/03/21 1837  AST 24  ALT 16  ALKPHOS 90  BILITOT 0.4  PROT 6.9  ALBUMIN 3.2*   No results for input(s): LIPASE, AMYLASE in the last 168 hours. No results for input(s): AMMONIA in the last 168 hours. Coagulation profile No results for input(s): INR, PROTIME in the last 168 hours.  CBC: Recent Labs  Lab 01/03/21 1837 01/03/21 1858 01/03/21 2058 01/04/21 0500 01/05/21 0100 01/06/21 0500  WBC 16.0*  --   --  10.4 12.7* 11.0*  NEUTROABS 13.1*  --   --   --   --   --   HGB 11.8* 12.6 11.6* 10.4* 11.2* 10.5*  HCT 37.8 37.0 34.0* 34.2* 35.9* 33.5*  MCV 94.0  --   --  94.0 92.3 92.8  PLT 313  --   --  273 259 272   Cardiac Enzymes: No results for input(s): CKTOTAL, CKMB, CKMBINDEX, TROPONINI in the last 168 hours. BNP (last 3 results) No results for input(s): PROBNP  in the last 8760 hours. CBG: Recent Labs  Lab  01/05/21 1621  GLUCAP 103*   D-Dimer: No results for input(s): DDIMER in the last 72 hours. Hgb A1c: No results for input(s): HGBA1C in the last 72 hours. Lipid Profile: No results for input(s): CHOL, HDL, LDLCALC, TRIG, CHOLHDL, LDLDIRECT in the last 72 hours. Thyroid function studies: No results for input(s): TSH, T4TOTAL, T3FREE, THYROIDAB in the last 72 hours.  Invalid input(s): FREET3 Anemia work up: No results for input(s): VITAMINB12, FOLATE, FERRITIN, TIBC, IRON, RETICCTPCT in the last 72 hours. Sepsis Labs: Recent Labs  Lab 01/03/21 1837 01/04/21 0500 01/04/21 1258 01/05/21 0100 01/06/21 0500  PROCALCITON  --   --  5.97  --   --   WBC 16.0* 10.4  --  12.7* 11.0*    Microbiology Recent Results (from the past 240 hour(s))  Resp Panel by RT-PCR (Flu A&B, Covid) Nasopharyngeal Swab     Status: None   Collection Time: 01/03/21  6:45 PM   Specimen: Nasopharyngeal Swab; Nasopharyngeal(NP) swabs in vial transport medium  Result Value Ref Range Status   SARS Coronavirus 2 by RT PCR NEGATIVE NEGATIVE Final    Comment: (NOTE) SARS-CoV-2 target nucleic acids are NOT DETECTED.  The SARS-CoV-2 RNA is generally detectable in upper respiratory specimens during the acute phase of infection. The lowest concentration of SARS-CoV-2 viral copies this assay can detect is 138 copies/mL. A negative result does not preclude SARS-Cov-2 infection and should not be used as the sole basis for treatment or other patient management decisions. A negative result may occur with  improper specimen collection/handling, submission of specimen other than nasopharyngeal swab, presence of viral mutation(s) within the areas targeted by this assay, and inadequate number of viral copies(<138 copies/mL). A negative result must be combined with clinical observations, patient history, and epidemiological information. The expected result is Negative.  Fact Sheet for Patients:   EntrepreneurPulse.com.au  Fact Sheet for Healthcare Providers:  IncredibleEmployment.be  This test is no t yet approved or cleared by the [place] FDA and  has been authorized for detection and/or diagnosis of SARS-CoV-2 by FDA under an Emergency Use Authorization (EUA). This EUA will remain  in effect (meaning this test can be used) for the duration of the COVID-19 declaration under Section 564(b)(1) of the Act, 21 U.S.C.section 360bbb-3(b)(1), unless the authorization is terminated  or revoked sooner.       Influenza A by PCR NEGATIVE NEGATIVE Final   Influenza B by PCR NEGATIVE NEGATIVE Final    Comment: (NOTE) The Xpert Xpress SARS-CoV-2/FLU/RSV plus assay is intended as an aid in the diagnosis of influenza from Nasopharyngeal swab specimens and should not be used as a sole basis for treatment. Nasal washings and aspirates are unacceptable for Xpert Xpress SARS-CoV-2/FLU/RSV testing.  Fact Sheet for Patients: EntrepreneurPulse.com.au  Fact Sheet for Healthcare Providers: IncredibleEmployment.be  This test is not yet approved or cleared by the [place] FDA and has been authorized for detection and/or diagnosis of SARS-CoV-2 by FDA under an Emergency Use Authorization (EUA). This EUA will remain in effect (meaning this test can be used) for the duration of the COVID-19 declaration under Section 564(b)(1) of the Act, 21 U.S.C. section 360bbb-3(b)(1), unless the authorization is terminated or revoked.  Performed at [facility], [place] [address] [place], [place] [address]     Procedures and diagnostic studies:  No results found.             LOS: 3 days  Aubreyana Saltz  [employer], advertising on [email or web address]. If 7PM-7AM, please contact night-coverage at [email or web address]     01/06/2021, 12:21 PM

## 2021-01-07 DIAGNOSIS — J9601 Acute respiratory failure with hypoxia: Secondary | ICD-10-CM | POA: Diagnosis not present

## 2021-01-07 LAB — CBC
HCT: 38.4 % (ref 36.0–46.0)
Hemoglobin: 12.2 g/dL (ref 12.0–15.0)
MCH: 29.2 pg (ref 26.0–34.0)
MCHC: 31.8 g/dL (ref 30.0–36.0)
MCV: 91.9 fL (ref 80.0–100.0)
Platelets: 257 10*3/uL (ref 150–400)
RBC: 4.18 MIL/uL (ref 3.87–5.11)
RDW: 14.7 % (ref 11.5–15.5)
WBC: 7.6 10*3/uL (ref 4.0–10.5)
nRBC: 0 % (ref 0.0–0.2)

## 2021-01-07 LAB — BASIC METABOLIC PANEL
Anion gap: 8 (ref 5–15)
BUN: 27 mg/dL — ABNORMAL HIGH (ref 8–23)
CO2: 34 mmol/L — ABNORMAL HIGH (ref 22–32)
Calcium: 9.4 mg/dL (ref 8.9–10.3)
Chloride: 96 mmol/L — ABNORMAL LOW (ref 98–111)
Creatinine, Ser: 0.75 mg/dL (ref 0.44–1.00)
GFR, Estimated: >60 mL/min (ref >60)
Glucose, Bld: 131 mg/dL — ABNORMAL HIGH (ref 70–99)
Potassium: 5.4 mmol/L — ABNORMAL HIGH (ref 3.5–5.1)
Sodium: 138 mmol/L (ref 135–145)

## 2021-01-07 LAB — MAGNESIUM: Magnesium: 2.2 mg/dL (ref 1.7–2.4)

## 2021-01-07 MED ORDER — IPRATROPIUM-ALBUTEROL 0.5-2.5 (3) MG/3ML IN SOLN
3.0000 mL | Freq: Two times a day (BID) | RESPIRATORY_TRACT | Status: DC
  Administered 2021-01-08: 3 mL via RESPIRATORY_TRACT
  Filled 2021-01-07 (×2): qty 3

## 2021-01-07 MED ORDER — AZITHROMYCIN 250 MG PO TABS
250.0000 mg | ORAL_TABLET | Freq: Every day | ORAL | Status: AC
  Administered 2021-01-07 – 2021-01-09 (×3): 250 mg via ORAL
  Filled 2021-01-07 (×3): qty 1

## 2021-01-07 NOTE — Plan of Care (Signed)
  Problem: Education: Goal: Knowledge of General Education information will improve Description Including pain rating scale, medication(s)/side effects and non-pharmacologic comfort measures Outcome: Progressing   Problem: Health Behavior/Discharge Planning: Goal: Ability to manage health-related needs will improve Outcome: Progressing   

## 2021-01-07 NOTE — Evaluation (Signed)
Physical Therapy Evaluation Patient Details Name: Donna Curry MRN: [id number] DOB: 1955/09/27 Today's Date: 01/07/2021   History of Present Illness  65 yo admitted 6/12 with SOB and COPD exacerbation. PMhx: COPD with chronic respiratory failure on 2L at home, HTN, bladder CA on immunotherapy, anemai  Clinical Impression  Pt pleasant and reports fatigue without strong desire to mobilize and wanting to go home in the AM. Pt reports she lives with older sister who is in good health and sister and brother are able to assist pt. Pt states she uses [facility] regularly and walks limited distance in home with reliance on WC for longer distance. Pt demonstrates decreased strength, function, balance and gait who will benefit from acute therapy to maximize mobility, safety and function to decrease burden of care.   Pt on 2L at rest at 92% with desaturation to 87% on 2L with limited mobility to standing. Pt required 4L for limited gait to maintain sats >90% HR up to 140 with only 20' of gait    Follow Up Recommendations Home health PT;Supervision for mobility/OOB    Equipment Recommendations  None recommended by PT    Recommendations for Other Services OT consult     Precautions / Restrictions Precautions Precautions: Fall Precaution Comments: watch vitals      Mobility  Bed Mobility Overal bed mobility: Modified Independent             General bed mobility comments: HOb 10 degrees    Transfers Overall transfer level: Needs assistance   Transfers: Sit to/from Stand;Stand Pivot Transfers Sit to Stand: Supervision Stand pivot transfers: Supervision       General transfer comment: supervision for lines with pivot to chair and RW present for bil UE support in standing  Ambulation/Gait Ambulation/Gait assistance: Min guard Gait Distance (Feet): 20 Feet Assistive device: Rolling walker (2 wheeled) Gait Pattern/deviations: Step-through pattern;Decreased stride length;Trunk flexed      General Gait Details: pt with self too posterior to RW with flexed trunk and mod cues to step into RW and extend trunk. pt with desaturation to 87% with transfer to standing on 2L and required 4L for limited gait with HR up to 140. Pt limited by fatigue  Stairs            Wheelchair Mobility    Modified Rankin (Stroke Patients Only)       Balance                                             Pertinent Vitals/Pain Pain Assessment: No/denies pain    Home Living Family/patient expects to be discharged to:: Private residence Living Arrangements: Other relatives Available Help at Discharge: Family Type of Home: House Home Access: Stairs to enter   Technical brewer of Steps: 1 Home Layout: One level Home Equipment: Environmental consultant - 2 wheels;Wheelchair - manual Additional Comments: pt reports she is supposed to have hoverround delivered    Prior Function Level of Independence: Needs assistance   Gait / Transfers Assistance Needed: limited ambulation at home, WC in community  ADL's / Homemaking Assistance Needed: sister does the homemaking, daughter does the driving        Hand Dominance        Extremity/Trunk Assessment   Upper Extremity Assessment Upper Extremity Assessment: Generalized weakness    Lower Extremity Assessment Lower Extremity Assessment: Generalized weakness (frail with very  limited muscle mass)    Cervical / Trunk Assessment Cervical / Trunk Assessment: Kyphotic  Communication   Communication: No difficulties  Cognition Arousal/Alertness: Awake/alert Behavior During Therapy: WFL for tasks assessed/performed Overall Cognitive Status: Impaired/Different from baseline Area of Impairment: Safety/judgement                         Safety/Judgement: Decreased awareness of deficits;Decreased awareness of safety     General Comments: pt with pad and underwear soaked as purewick not in place on arrival and pt  unaware      General Comments      Exercises     Assessment/Plan    PT Assessment Patient needs continued PT services  PT Problem List Decreased strength;Decreased mobility;Decreased safety awareness;Decreased activity tolerance;Decreased balance;Decreased knowledge of use of DME;Cardiopulmonary status limiting activity       PT Treatment Interventions Gait training;Balance training;Functional mobility training;Therapeutic activities;Patient/family education;Therapeutic exercise;DME instruction    PT Goals (Current goals can be found in the Care Plan section)  Acute Rehab PT Goals Patient Stated Goal: return home and watch tv PT Goal Formulation: With patient Time For Goal Achievement: 01/21/21 Potential to Achieve Goals: Fair    Frequency Min 3X/week   Barriers to discharge Decreased caregiver support      Co-evaluation               AM-PAC PT "6 Clicks" Mobility  Outcome Measure Help needed turning from your back to your side while in a flat bed without using bedrails?: A Little Help needed moving from lying on your back to sitting on the side of a flat bed without using bedrails?: A Little Help needed moving to and from a bed to a chair (including a wheelchair)?: A Little Help needed standing up from a chair using your arms (e.g., wheelchair or bedside chair)?: A Little Help needed to walk in hospital room?: A Little Help needed climbing 3-5 steps with a railing? : A Lot 6 Click Score: 17    End of Session Equipment Utilized During Treatment: Oxygen Activity Tolerance: Patient limited by fatigue Patient left: in chair;with call bell/phone within reach;with chair alarm set Nurse Communication: Mobility status PT Visit Diagnosis: Other abnormalities of gait and mobility (R26.89);Difficulty in walking, not elsewhere classified (R26.2);Muscle weakness (generalized) (M62.81)    Time: 1194-1740 PT Time Calculation (min) (ACUTE ONLY): 27 min   Charges:   PT  Evaluation $PT Eval Moderate Complexity: 1 Mod PT Treatments $Therapeutic Activity: 8-22 mins        Shimika Ames P, PT Acute Rehabilitation Services Pager: [phone] Office: [phone]   Abid Bolla B Shalisha Clausing 01/07/2021, 1:39 PM

## 2021-01-07 NOTE — Progress Notes (Signed)
PROGRESS NOTE    Donna Curry  KWI:[id number] DOB: 1956-02-29 DOA: 01/03/2021 PCP: [name], NP    Brief Narrative:  65 y.o. female with medical history significant for COPD, chronic hypoxic respiratory failure on 2 L/min oxygen at home, tobacco use disorder, hypertension, bladder cancer on immunotherapy, chronic anemia, who presented to the hospital with nonproductive cough and shortness of breath.  Assessment & Plan:   Principal Problem:   Acute respiratory failure with hypoxia (HCC) Active Problems:   Essential hypertension   Bladder cancer (HCC)   COPD with acute exacerbation (HCC)   Acute respiratory failure with hypoxemia (HCC)  Acute respiratory failure with hypoxia, requiring BiPAP Acute COPD exacerbation - Pt had been requiring intermittent BiPAP this stay - Chest x-ray is negative - Pt is continued onSolu-Medrol IV every 12 hours. - Continued on Rocephin, azithromycin.  Pro-Cal 5.9 - Cont neb tx as needed -Weaned to baseline 2LNC, but lung sounds sounded decreased   Essential hypertension - On Norvasc. Cont with IV hydralazine as needed -BP stable   History of bladder cancer - Continued on immunotherapy.   Anemia of chronic disease - Hemoglobin stable without any active signs of bleeding -Hgb 12.2, not anemic today   Chronic pain - Cont on oxycodone.  As needed stool softener   Depression/anxiety - On citalopram.  As needed Ativan -Seems stable at this time    DVT prophylaxis: Lovenox subq Code Status: Full Family Communication: Pt in room, family not at bedside  Status is: Inpatient  Remains inpatient appropriate because:Inpatient level of care appropriate due to severity of illness  Dispo: The patient is from: Home              Anticipated d/c is to: Home              Patient currently is not medically stable to d/c.   Difficult to place patient No       Consultants:    Procedures:    Antimicrobials: Anti-infectives  (From admission, onward)    Start     Dose/Rate Route Frequency Ordered Stop   01/07/21 1045  azithromycin (ZITHROMAX) tablet 250 mg        250 mg Oral Daily 01/07/21 0953 01/10/21 0959   01/05/21 1000  azithromycin (ZITHROMAX) tablet 500 mg  Status:  Discontinued        500 mg Oral Daily 01/05/21 0737 01/07/21 0953   01/04/21 2000  cefTRIAXone (ROCEPHIN) 1 g in sodium chloride 0.9 % 100 mL IVPB        1 g 200 mL/hr over 30 Minutes Intravenous Every 24 hours 01/03/21 2244 01/09/21 1959   01/03/21 2015  azithromycin (ZITHROMAX) 500 mg in sodium chloride 0.9 % 250 mL IVPB        500 mg 250 mL/hr over 60 Minutes Intravenous  Once 01/03/21 2007 01/03/21 2258   01/03/21 2015  cefTRIAXone (ROCEPHIN) 1 g in sodium chloride 0.9 % 100 mL IVPB        1 g 200 mL/hr over 30 Minutes Intravenous  Once 01/03/21 2007 01/03/21 2258       Subjective: Reports feeling better  Objective: Vitals:   01/07/21 1400 01/07/21 1424 01/07/21 1500 01/07/21 1624  BP:    (!) 154/91  Pulse: (!) 102 (!) 105 100 97  Resp: 14 12  16   Temp:    99 F (37.2 C)  TempSrc:    Oral  SpO2: 94% 100% 98% 97%  Weight:  Height:        Intake/Output Summary (Last 24 hours) at 01/07/2021 1731 Last data filed at 01/07/2021 0334 Gross per 24 hour  Intake --  Output 1200 ml  Net -1200 ml   Filed Weights   01/03/21 1838 01/04/21 1236  Weight: 29.9 kg 29.9 kg    Examination: General exam: Awake, laying in bed, in nad Respiratory system: decreased breath sounds, no wheezing Cardiovascular system: regular rate, s1, s2 Gastrointestinal system: Soft, nondistended, positive BS Central nervous system: CN2-12 grossly intact, strength intact Extremities: Perfused, no clubbing Skin: Normal skin turgor, no notable skin lesions seen Psychiatry: Mood normal // no visual hallucinations   Data Reviewed: I have personally reviewed following labs and imaging studies  CBC: Recent Labs  Lab 01/03/21 1837 01/03/21 1858  01/03/21 2058 01/04/21 0500 01/05/21 0100 01/06/21 0500 01/07/21 0213  WBC 16.0*  --   --  10.4 12.7* 11.0* 7.6  NEUTROABS 13.1*  --   --   --   --   --   --   HGB 11.8*   < > 11.6* 10.4* 11.2* 10.5* 12.2  HCT 37.8   < > 34.0* 34.2* 35.9* 33.5* 38.4  MCV 94.0  --   --  94.0 92.3 92.8 91.9  PLT 313  --   --  273 259 272 257   < > = values in this interval not displayed.   Basic Metabolic Panel: Recent Labs  Lab 01/03/21 1837 01/03/21 1858 01/03/21 2058 01/04/21 0500 01/05/21 0100 01/06/21 0500 01/07/21 0213  NA 141 142 142 141 140 141 138  K 3.8 3.7 3.3* 3.5 4.1 4.9 5.4*  CL 102 103  --  102 100 96* 96*  CO2 29  --   --  26 32 36* 34*  GLUCOSE 179* 180*  --  165* 115* 123* 131*  BUN 13 13  --  14 20 32* 27*  CREATININE 0.77 0.70  --  0.82 0.80 0.81 0.75  CALCIUM 9.1  --   --  9.2 9.5 9.1 9.4  MG  --   --   --   --  2.2 2.2 2.2   GFR: Estimated Creatinine Clearance: 33.5 mL/min (by C-G formula based on SCr of 0.75 mg/dL). Liver Function Tests: Recent Labs  Lab 01/03/21 1837  AST 24  ALT 16  ALKPHOS 90  BILITOT 0.4  PROT 6.9  ALBUMIN 3.2*   No results for input(s): LIPASE, AMYLASE in the last 168 hours. No results for input(s): AMMONIA in the last 168 hours. Coagulation Profile: No results for input(s): INR, PROTIME in the last 168 hours. Cardiac Enzymes: No results for input(s): CKTOTAL, CKMB, CKMBINDEX, TROPONINI in the last 168 hours. BNP (last 3 results) No results for input(s): PROBNP in the last 8760 hours. HbA1C: No results for input(s): HGBA1C in the last 72 hours. CBG: Recent Labs  Lab 01/05/21 1621  GLUCAP 103*   Lipid Profile: No results for input(s): CHOL, HDL, LDLCALC, TRIG, CHOLHDL, LDLDIRECT in the last 72 hours. Thyroid Function Tests: No results for input(s): TSH, T4TOTAL, FREET4, T3FREE, THYROIDAB in the last 72 hours. Anemia Panel: No results for input(s): VITAMINB12, FOLATE, FERRITIN, TIBC, IRON, RETICCTPCT in the last 72  hours. Sepsis Labs: Recent Labs  Lab 01/04/21 1258  PROCALCITON 5.97    Recent Results (from the past 240 hour(s))  Resp Panel by RT-PCR (Flu A&B, Covid) Nasopharyngeal Swab     Status: None   Collection Time: 01/03/21  6:45 PM  Specimen: Nasopharyngeal Swab; Nasopharyngeal(NP) swabs in vial transport medium  Result Value Ref Range Status   SARS Coronavirus 2 by RT PCR NEGATIVE NEGATIVE Final    Comment: (NOTE) SARS-CoV-2 target nucleic acids are NOT DETECTED.  The SARS-CoV-2 RNA is generally detectable in upper respiratory specimens during the acute phase of infection. The lowest concentration of SARS-CoV-2 viral copies this assay can detect is 138 copies/mL. A negative result does not preclude SARS-Cov-2 infection and should not be used as the sole basis for treatment or other patient management decisions. A negative result may occur with  improper specimen collection/handling, submission of specimen other than nasopharyngeal swab, presence of viral mutation(s) within the areas targeted by this assay, and inadequate number of viral copies(<138 copies/mL). A negative result must be combined with clinical observations, patient history, and epidemiological information. The expected result is Negative.  Fact Sheet for Patients:  EntrepreneurPulse.com.au  Fact Sheet for Healthcare Providers:  IncredibleEmployment.be  This test is no t yet approved or cleared by the [place] FDA and  has been authorized for detection and/or diagnosis of SARS-CoV-2 by FDA under an Emergency Use Authorization (EUA). This EUA will remain  in effect (meaning this test can be used) for the duration of the COVID-19 declaration under Section 564(b)(1) of the Act, 21 U.S.C.section 360bbb-3(b)(1), unless the authorization is terminated  or revoked sooner.       Influenza A by PCR NEGATIVE NEGATIVE Final   Influenza B by PCR NEGATIVE NEGATIVE Final     Comment: (NOTE) The Xpert Xpress SARS-CoV-2/FLU/RSV plus assay is intended as an aid in the diagnosis of influenza from Nasopharyngeal swab specimens and should not be used as a sole basis for treatment. Nasal washings and aspirates are unacceptable for Xpert Xpress SARS-CoV-2/FLU/RSV testing.  Fact Sheet for Patients: EntrepreneurPulse.com.au  Fact Sheet for Healthcare Providers: IncredibleEmployment.be  This test is not yet approved or cleared by the [place] FDA and has been authorized for detection and/or diagnosis of SARS-CoV-2 by FDA under an Emergency Use Authorization (EUA). This EUA will remain in effect (meaning this test can be used) for the duration of the COVID-19 declaration under Section 564(b)(1) of the Act, 21 U.S.C. section 360bbb-3(b)(1), unless the authorization is terminated or revoked.  Performed at [facility], [place] [address] [place], [place] [address]      Radiology Studies: No results found.  Scheduled Meds:  amLODipine  10 mg Oral Daily   aspirin EC  81 mg Oral Daily   azithromycin  250 mg Oral Daily   budesonide (PULMICORT) nebulizer solution  0.25 mg Nebulization BID   Chlorhexidine Gluconate Cloth  6 each Topical Daily   citalopram  10 mg Oral Daily   enoxaparin (LOVENOX) injection  20 mg Subcutaneous Q24H   feeding supplement  237 mL Oral BID BM   [START ON 01/08/2021] ipratropium-albuterol  3 mL Nebulization BID   methylPREDNISolone (SOLU-MEDROL) injection  40 mg Intravenous Q12H   sodium chloride flush  10-40 mL Intracatheter Q12H   Continuous Infusions:  cefTRIAXone (ROCEPHIN)  IV 1 g (01/06/21 2034)     LOS: 4 days   Marylu Lund, MD [facility] Pager On Amion  If 7PM-7AM, please contact night-coverage 01/07/2021, 5:31 PM

## 2021-01-07 NOTE — Progress Notes (Signed)
Tried to take patient off O2, after about 5 minutes just sitting in the bed O2 sats dropped to 84% without exertion. Patient placed back on 2L via n/c and quickly recovered. O2 sats now 96% on 2L.

## 2021-01-08 ENCOUNTER — Other Ambulatory Visit (HOSPITAL_COMMUNITY): Payer: Self-pay

## 2021-01-08 DIAGNOSIS — J9601 Acute respiratory failure with hypoxia: Secondary | ICD-10-CM | POA: Diagnosis not present

## 2021-01-08 LAB — CBC
HCT: 35.5 % — ABNORMAL LOW (ref 36.0–46.0)
Hemoglobin: 11.2 g/dL — ABNORMAL LOW (ref 12.0–15.0)
MCH: 28.6 pg (ref 26.0–34.0)
MCHC: 31.5 g/dL (ref 30.0–36.0)
MCV: 90.6 fL (ref 80.0–100.0)
Platelets: 253 10*3/uL (ref 150–400)
RBC: 3.92 MIL/uL (ref 3.87–5.11)
RDW: 14.7 % (ref 11.5–15.5)
WBC: 5.1 10*3/uL (ref 4.0–10.5)
nRBC: 0 % (ref 0.0–0.2)

## 2021-01-08 LAB — BASIC METABOLIC PANEL
Anion gap: 7 (ref 5–15)
BUN: 20 mg/dL (ref 8–23)
CO2: 36 mmol/L — ABNORMAL HIGH (ref 22–32)
Calcium: 9.4 mg/dL (ref 8.9–10.3)
Chloride: 93 mmol/L — ABNORMAL LOW (ref 98–111)
Creatinine, Ser: 0.72 mg/dL (ref 0.44–1.00)
GFR, Estimated: >60 mL/min (ref >60)
Glucose, Bld: 130 mg/dL — ABNORMAL HIGH (ref 70–99)
Potassium: 5 mmol/L (ref 3.5–5.1)
Sodium: 136 mmol/L (ref 135–145)

## 2021-01-08 LAB — MAGNESIUM: Magnesium: 2.2 mg/dL (ref 1.7–2.4)

## 2021-01-08 MED ORDER — PREDNISONE 10 MG PO TABS: ORAL_TABLET | ORAL | 0 refills | Status: DC

## 2021-01-08 MED ORDER — AZITHROMYCIN 250 MG PO TABS: ORAL_TABLET | ORAL | 0 refills | Status: DC

## 2021-01-08 MED ORDER — AZITHROMYCIN 250 MG PO TABS
ORAL_TABLET | ORAL | 0 refills | Status: DC
  Filled 2021-01-08: qty 1, 1d supply, fill #0

## 2021-01-08 MED ORDER — CEFDINIR 300 MG PO CAPS
300.0000 mg | ORAL_CAPSULE | Freq: Two times a day (BID) | ORAL | 0 refills | Status: AC
  Filled 2021-01-08: qty 2, 1d supply, fill #0

## 2021-01-08 MED ORDER — PREDNISONE 10 MG PO TABS
ORAL_TABLET | ORAL | 0 refills | Status: AC
  Filled 2021-01-08: qty 22, 11d supply, fill #0

## 2021-01-08 MED ORDER — HEPARIN SOD (PORK) LOCK FLUSH 100 UNIT/ML IV SOLN
500.0000 [IU] | INTRAVENOUS | Status: AC | PRN
  Administered 2021-01-08: 500 [IU]
  Filled 2021-01-08: qty 5

## 2021-01-08 MED ORDER — CEFDINIR 300 MG PO CAPS: 300.0000 mg | ORAL_CAPSULE | Freq: Two times a day (BID) | ORAL | 0 refills | Status: DC

## 2021-01-08 NOTE — Progress Notes (Signed)
PROGRESS NOTE    Donna Curry  BDZ:[id number] DOB: April 08, 1956 DOA: 01/03/2021 PCP: [name], NP    Brief Narrative:  65 y.o. female with medical history significant for COPD, chronic hypoxic respiratory failure on 2 L/min oxygen at home, tobacco use disorder, hypertension, bladder cancer on immunotherapy, chronic anemia, who presented to the hospital with nonproductive cough and shortness of breath.  Assessment & Plan:   Principal Problem:   Acute respiratory failure with hypoxia (HCC) Active Problems:   Essential hypertension   Bladder cancer (HCC)   COPD with acute exacerbation (HCC)   Acute respiratory failure with hypoxemia (HCC)  Acute respiratory failure with hypoxia, requiring BiPAP Acute COPD exacerbation - Pt had been requiring intermittent BiPAP this stay - Chest x-ray is negative - Pt is continued onSolu-Medrol IV every 12 hours. - Continued on Rocephin, azithromycin.  Pro-Cal 5.9 - Cont neb tx as needed -Weaned to baseline [facility] with clear breath sounds. Baseline O2 requirements, breathing comfortably -Original plan to d/c however there are issues with home O2 availability as well as DME at home. Will need to discuss with TOC to establish a safe discharge   Essential hypertension - On Norvasc. Cont with IV hydralazine as needed -BP stable   History of bladder cancer - Continued on immunotherapy.   Anemia of chronic disease - Hemoglobin stable without any active signs of bleeding -stable hemodynamically   Chronic pain - Cont on oxycodone.  As needed stool softener   Depression/anxiety - On citalopram.  As needed Ativan -Seems stable at this time    DVT prophylaxis: Lovenox subq Code Status: Full Family Communication: Pt in room, family not at bedside  Status is: Inpatient  Remains inpatient appropriate because:Inpatient level of care appropriate due to severity of illness  Dispo: The patient is from: Home              Anticipated d/c  is to: Home              Patient currently is not medically stable to d/c.   Difficult to place patient No       Consultants:    Procedures:    Antimicrobials: Anti-infectives (From admission, onward)    Start     Dose/Rate Route Frequency Ordered Stop   01/09/21 0000  azithromycin (ZITHROMAX) 250 MG tablet  Status:  Discontinued           01/08/21 1400 01/08/21    01/09/21 0000  cefdinir (OMNICEF) 300 MG capsule  Status:  Discontinued        300 mg Oral 2 times daily 01/08/21 1400 01/08/21    01/09/21 0000  azithromycin (ZITHROMAX) 250 MG tablet           01/08/21 1402     01/09/21 0000  cefdinir (OMNICEF) 300 MG capsule        300 mg Oral 2 times daily 01/08/21 1402 01/10/21 2359   01/07/21 1045  azithromycin (ZITHROMAX) tablet 250 mg        250 mg Oral Daily 01/07/21 0953 01/10/21 0959   01/05/21 1000  azithromycin (ZITHROMAX) tablet 500 mg  Status:  Discontinued        500 mg Oral Daily 01/05/21 0737 01/07/21 0953   01/04/21 2000  cefTRIAXone (ROCEPHIN) 1 g in sodium chloride 0.9 % 100 mL IVPB        1 g 200 mL/hr over 30 Minutes Intravenous Every 24 hours 01/03/21 2244 01/09/21 1959   01/03/21 2015  azithromycin (ZITHROMAX)  500 mg in sodium chloride 0.9 % 250 mL IVPB        500 mg 250 mL/hr over 60 Minutes Intravenous  Once 01/03/21 2007 01/03/21 2258   01/03/21 2015  cefTRIAXone (ROCEPHIN) 1 g in sodium chloride 0.9 % 100 mL IVPB        1 g 200 mL/hr over 30 Minutes Intravenous  Once 01/03/21 2007 01/03/21 2258       Subjective: Denies sob  Objective: Vitals:   01/08/21 0308 01/08/21 0805 01/08/21 0818 01/08/21 1216  BP: (!) 136/92 (!) 147/92  (!) 139/97  Pulse: (!) 103 91 83 (!) 102  Resp: 18 14 15 13   Temp: 98.9 F (37.2 C) 98.7 F (37.1 C)  99 F (37.2 C)  TempSrc: Oral Oral  Oral  SpO2: 98% 95% 100% 99%  Weight:      Height:       No intake or output data in the 24 hours ending 01/08/21 1902  Filed Weights   01/03/21 1838 01/04/21 1236   Weight: 29.9 kg 29.9 kg    Examination: General exam: Conversant, in no acute distress Respiratory system: normal chest rise, clear, no audible wheezing Cardiovascular system: regular rhythm, s1-s2 Gastrointestinal system: Nondistended, nontender, pos BS Central nervous system: No seizures, no tremors Extremities: No cyanosis, no joint deformities Skin: No rashes, no pallor Psychiatry: Affect normal // no auditory hallucinations   Data Reviewed: I have personally reviewed following labs and imaging studies  CBC: Recent Labs  Lab 01/03/21 1837 01/03/21 1858 01/04/21 0500 01/05/21 0100 01/06/21 0500 01/07/21 0213 01/08/21 0243  WBC 16.0*  --  10.4 12.7* 11.0* 7.6 5.1  NEUTROABS 13.1*  --   --   --   --   --   --   HGB 11.8*   < > 10.4* 11.2* 10.5* 12.2 11.2*  HCT 37.8   < > 34.2* 35.9* 33.5* 38.4 35.5*  MCV 94.0  --  94.0 92.3 92.8 91.9 90.6  PLT 313  --  273 259 272 257 253   < > = values in this interval not displayed.    Basic Metabolic Panel: Recent Labs  Lab 01/04/21 0500 01/05/21 0100 01/06/21 0500 01/07/21 0213 01/08/21 0243  NA 141 140 141 138 136  K 3.5 4.1 4.9 5.4* 5.0  CL 102 100 96* 96* 93*  CO2 26 32 36* 34* 36*  GLUCOSE 165* 115* 123* 131* 130*  BUN 14 20 32* 27* 20  CREATININE 0.82 0.80 0.81 0.75 0.72  CALCIUM 9.2 9.5 9.1 9.4 9.4  MG  --  2.2 2.2 2.2 2.2    GFR: Estimated Creatinine Clearance: 33.5 mL/min (by C-G formula based on SCr of 0.72 mg/dL). Liver Function Tests: Recent Labs  Lab 01/03/21 1837  AST 24  ALT 16  ALKPHOS 90  BILITOT 0.4  PROT 6.9  ALBUMIN 3.2*    No results for input(s): LIPASE, AMYLASE in the last 168 hours. No results for input(s): AMMONIA in the last 168 hours. Coagulation Profile: No results for input(s): INR, PROTIME in the last 168 hours. Cardiac Enzymes: No results for input(s): CKTOTAL, CKMB, CKMBINDEX, TROPONINI in the last 168 hours. BNP (last 3 results) No results for input(s): PROBNP in the last  8760 hours. HbA1C: No results for input(s): HGBA1C in the last 72 hours. CBG: Recent Labs  Lab 01/05/21 1621  GLUCAP 103*    Lipid Profile: No results for input(s): CHOL, HDL, LDLCALC, TRIG, CHOLHDL, LDLDIRECT in the last 72 hours. Thyroid  Function Tests: No results for input(s): TSH, T4TOTAL, FREET4, T3FREE, THYROIDAB in the last 72 hours. Anemia Panel: No results for input(s): VITAMINB12, FOLATE, FERRITIN, TIBC, IRON, RETICCTPCT in the last 72 hours. Sepsis Labs: Recent Labs  Lab 01/04/21 1258  PROCALCITON 5.97     Recent Results (from the past 240 hour(s))  Resp Panel by RT-PCR (Flu A&B, Covid) Nasopharyngeal Swab     Status: None   Collection Time: 01/03/21  6:45 PM   Specimen: Nasopharyngeal Swab; Nasopharyngeal(NP) swabs in vial transport medium  Result Value Ref Range Status   SARS Coronavirus 2 by RT PCR NEGATIVE NEGATIVE Final    Comment: (NOTE) SARS-CoV-2 target nucleic acids are NOT DETECTED.  The SARS-CoV-2 RNA is generally detectable in upper respiratory specimens during the acute phase of infection. The lowest concentration of SARS-CoV-2 viral copies this assay can detect is 138 copies/mL. A negative result does not preclude SARS-Cov-2 infection and should not be used as the sole basis for treatment or other patient management decisions. A negative result may occur with  improper specimen collection/handling, submission of specimen other than nasopharyngeal swab, presence of viral mutation(s) within the areas targeted by this assay, and inadequate number of viral copies(<138 copies/mL). A negative result must be combined with clinical observations, patient history, and epidemiological information. The expected result is Negative.  Fact Sheet for Patients:  EntrepreneurPulse.com.au  Fact Sheet for Healthcare Providers:  IncredibleEmployment.be  This test is no t yet approved or cleared by the [place] FDA and   has been authorized for detection and/or diagnosis of SARS-CoV-2 by FDA under an Emergency Use Authorization (EUA). This EUA will remain  in effect (meaning this test can be used) for the duration of the COVID-19 declaration under Section 564(b)(1) of the Act, 21 U.S.C.section 360bbb-3(b)(1), unless the authorization is terminated  or revoked sooner.       Influenza A by PCR NEGATIVE NEGATIVE Final   Influenza B by PCR NEGATIVE NEGATIVE Final    Comment: (NOTE) The Xpert Xpress SARS-CoV-2/FLU/RSV plus assay is intended as an aid in the diagnosis of influenza from Nasopharyngeal swab specimens and should not be used as a sole basis for treatment. Nasal washings and aspirates are unacceptable for Xpert Xpress SARS-CoV-2/FLU/RSV testing.  Fact Sheet for Patients: EntrepreneurPulse.com.au  Fact Sheet for Healthcare Providers: IncredibleEmployment.be  This test is not yet approved or cleared by the [place] FDA and has been authorized for detection and/or diagnosis of SARS-CoV-2 by FDA under an Emergency Use Authorization (EUA). This EUA will remain in effect (meaning this test can be used) for the duration of the COVID-19 declaration under Section 564(b)(1) of the Act, 21 U.S.C. section 360bbb-3(b)(1), unless the authorization is terminated or revoked.  Performed at [facility], [place] [address] [place], [place] [address]       Radiology Studies: No results found.  Scheduled Meds:  amLODipine  10 mg Oral Daily   aspirin EC  81 mg Oral Daily   azithromycin  250 mg Oral Daily   budesonide (PULMICORT) nebulizer solution  0.25 mg Nebulization BID   Chlorhexidine Gluconate Cloth  6 each Topical Daily   citalopram  10 mg Oral Daily   enoxaparin (LOVENOX) injection  20 mg Subcutaneous Q24H   feeding supplement  237 mL Oral BID BM   methylPREDNISolone (SOLU-MEDROL) injection  40 mg Intravenous Q12H   sodium chloride flush  10-40  mL Intracatheter Q12H   Continuous Infusions:  cefTRIAXone (ROCEPHIN)  IV 1 g (01/07/21  2017)     LOS: 5 days   Marylu Lund, MD [facility] Pager On Amion  If 7PM-7AM, please contact night-coverage 01/08/2021, 7:02 PM

## 2021-01-08 NOTE — Evaluation (Signed)
Occupational Therapy Evaluation Patient Details Name: Donna Curry MRN: [id number] DOB: 1955/08/18 Today's Date: 01/08/2021    History of Present Illness 65 yo admitted 6/12 with SOB and COPD exacerbation. PMhx: COPD with chronic respiratory failure on 2L at home, HTN, bladder CA on immunotherapy, anemai   Clinical Impression   PTA patient was living with her sister in a private residence and was performing ADLs with Mod I. Patient reports independence with medication management and states that family assists with cooking/cleaning and providing transportation. Patient currently functioning below baseline demonstrating observed ADLs including grooming standing at sink level with Min guard for steadying. Patient also limited by deficits listed below including decreased cardiopulmonary endurance, generalized weakness, and decreased activity tolerance and would benefit from continued acute OT services in prep for safe d/c home.     Follow Up Recommendations  Home health OT;Supervision - Intermittent    Equipment Recommendations  None recommended by OT (Patient has necessary DME.)    Recommendations for Other Services       Precautions / Restrictions Precautions Precautions: Fall Precaution Comments: watch vitals Restrictions Weight Bearing Restrictions: No      Mobility Bed Mobility Overal bed mobility: Modified Independent             General bed mobility comments: HOB slightly elevated.    Transfers Overall transfer level: Needs assistance   Transfers: Sit to/from Stand;Stand Pivot Transfers Sit to Stand: Supervision Stand pivot transfers: Supervision       General transfer comment: Supervision A for line management.    Balance Overall balance assessment: Needs assistance Sitting-balance support: No upper extremity supported;Feet unsupported;Feet supported Sitting balance-Leahy Scale: Good     Standing balance support: No upper extremity supported;During  functional activity Standing balance-Leahy Scale: Fair Standing balance comment: Able to maintain static standing balnace without UE support. Reliant on unilateral UE support with dynamic tasks.                           ADL either performed or assessed with clinical judgement   ADL Overall ADL's : Needs assistance/impaired Eating/Feeding: Set up;Sitting   Grooming: Wash/dry face;Wash/dry hands;Min guard;Supervision/safety Grooming Details (indicate cue type and reason): Min guard for 2/3 grooming tasks standing at sink level with unilateral UE support on sink surface.             Lower Body Dressing: Min guard;Sit to/from stand Lower Body Dressing Details (indicate cue type and reason): Patient able to doff/don footwear seated EOB without external assist. Toilet Transfer: Supervision/safety Toilet Transfer Details (indicate cue type and reason): Simulated with transfer to recliner and close supervision A.           General ADL Comments: Patient limited by decreased activity tolerance and generalized weakness.     Vision Patient Visual Report: No change from baseline       Perception     Praxis      Pertinent Vitals/Pain Pain Assessment: No/denies pain     Hand Dominance Right   Extremity/Trunk Assessment Upper Extremity Assessment Upper Extremity Assessment: Generalized weakness   Lower Extremity Assessment Lower Extremity Assessment: Generalized weakness   Cervical / Trunk Assessment Cervical / Trunk Assessment: Kyphotic   Communication Communication Communication: No difficulties   Cognition Arousal/Alertness: Awake/alert Behavior During Therapy: WFL for tasks assessed/performed Overall Cognitive Status: Within Functional Limits for tasks assessed  General Comments  SpO2 97% on 2L upon entry. Patient removed O2 for face washing standing at sink with SpO2 quickly dropping to 87%. SpO2 98% on  2L at conclusion of treatment session.    Exercises     Shoulder Instructions      Home Living Family/patient expects to be discharged to:: Private residence Living Arrangements: Other relatives Available Help at Discharge: Family Type of Home: House Home Access: Stairs to enter Technical brewer of Steps: 1   Home Layout: One level     Bathroom Shower/Tub: Teacher, early years/pre: Standard     Home Equipment: Environmental consultant - 2 wheels;Wheelchair - [employer];Shower seat   Additional Comments: pt reports she is supposed to have hoverround delivered      Prior Functioning/Environment Level of Independence: Needs assistance  Gait / Transfers Assistance Needed: limited ambulation at home, WC in community ADL's / Homemaking Assistance Needed: Completes ADLs with Mod I; manages medications independently; reports family provides assist with meal prep, housekeeping and transportation.            OT Problem List: Decreased strength;Decreased activity tolerance;Impaired balance (sitting and/or standing);Cardiopulmonary status limiting activity      OT Treatment/Interventions: Self-care/ADL training;Therapeutic exercise;Energy conservation;DME and/or AE instruction;Therapeutic activities;Patient/family education;Balance training    OT Goals(Current goals can be found in the care plan section) Acute Rehab OT Goals Patient Stated Goal: To return home. OT Goal Formulation: With patient Time For Goal Achievement: 01/22/21 Potential to Achieve Goals: Good ADL Goals Additional ADL Goal #1: Patient will complete ADLs with Mod I and AD PRN. Additional ADL Goal #2: Patient will recall 3/3 energy conservation techniques in prep for ADLs.  OT Frequency: Min 2X/week   Barriers to D/C:            Co-evaluation              AM-PAC OT "6 Clicks" Daily Activity     Outcome Measure Help from another person eating meals?: None Help from another person taking  care of personal grooming?: A Little Help from another person toileting, which includes using toliet, bedpan, or urinal?: A Little Help from another person bathing (including washing, rinsing, drying)?: A Little Help from another person to put on and taking off regular upper body clothing?: A Little Help from another person to put on and taking off regular lower body clothing?: A Little 6 Click Score: 19   End of Session Equipment Utilized During Treatment: Gait belt Nurse Communication: Mobility status  Activity Tolerance: Patient tolerated treatment well Patient left: in chair;with call bell/phone within reach;with chair alarm set  OT Visit Diagnosis: Unsteadiness on feet (R26.81);Muscle weakness (generalized) (M62.81)                Time: 6553-7482 OT Time Calculation (min): 18 min Charges:  OT General Charges $OT Visit: 1 Visit OT Evaluation $OT Eval Moderate Complexity: 1 Mod  Gwenevere Goga H. OTR/L Supplemental OT, Department of rehab services [phone]  Hektor Huston R H. 01/08/2021, 8:18 AM

## 2021-01-08 NOTE — Consult Note (Signed)
   [facility] Inpatient Consult   01/08/2021  Donna Curry Jan 22, 1956 [id number]    [place] Organization [ACO] Patient: [name]   Primary Care Provider: [name], NP [place] [facility] [place], Embedded provider   Patient screened for hospitalization with noted high risk score for unplanned readmission risk and to assess for potential [place] Management needs. [THN] Care Management service needs for post hospital transition.  Review of patient's medical record reveals patient is in the [facility] Embedded practice.  Patient had previously been referred to an [place] Management team however there was no answer to phone.  Attempts to speak with patient at bedside hone today however there ws no answer spoke with Anderson Malta at desk and attempts to reach patient again was unsuccessful.  Plan:  Given the patient high risk score for unplanned readmission and unsuccessful out reach for Embedded TOC and CCM, this writer will make another referral for CCM follow up.   For questions contact:   Natividad Brood, RN BSN [facility] Liaison  [phone] business mobile phone Toll free office [phone]  Fax number: [phone] [email or web address]

## 2021-01-08 NOTE — TOC Initial Note (Signed)
Transition of Care [facility]) - Initial/Assessment Note    Patient Details  Name: Donna Curry MRN: [id number] Date of Birth: Oct 26, 1955  Transition of Care [facility]) CM/SW Contact:    Angelita Ingles, RN Phone Number:[phone]  01/08/2021, 12:43 PM  Clinical Narrative:                 [facility] consulted for adult with high risk for readmission and HH needs. Patient states that  is she is from home with her adult siblings who will assist with providing care for her. Patient refuses [place] at this time. Patient states that she does have a PCP that she follows up with on a regular basis. Patient  also reports that she has adequate transportation and access to her medications. No other needs noted at this time TOC will sign off.   Expected Discharge Plan: [place] Barriers to Discharge: No Barriers Identified   Patient Goals and CMS Choice Patient states their goals for this hospitalization and ongoing recovery are:: Ready to go home as soon as I feel better CMS Medicare.gov Compare Post Acute Care list provided to:: Patient Choice offered to / list presented to : Patient  Expected Discharge Plan and Services Expected Discharge Plan: [place] In-house Referral: NA Discharge Planning Services: CM Consult Post Acute Care Choice: [place] arrangements for the past 2 months: Single Family Home                 DME Arranged: N/A DME Agency: NA       HH Arranged: Refused [place] Agency: NA        Prior Living Arrangements/Services Living arrangements for the past 2 months: Single Family Home Lives with:: Siblings Patient language and need for interpreter reviewed:: Yes Do you feel safe going back to the place where you live?: Yes      Need for Family Participation in Patient Care: Yes (Comment) Care giver support system in place?: Yes (comment) Current home services: DME Criminal Activity/Legal Involvement Pertinent to Current  Situation/Hospitalization: No - Comment as needed  Activities of Daily Living Home Assistive Devices/Equipment: None ADL Screening (condition at time of admission) Patient's cognitive ability adequate to safely complete daily activities?: Yes Is the patient deaf or have difficulty hearing?: No Does the patient have difficulty seeing, even when wearing glasses/contacts?: Yes Does the patient have difficulty concentrating, remembering, or making decisions?: No Patient able to express need for assistance with ADLs?: Yes Does the patient have difficulty dressing or bathing?: Yes Independently performs ADLs?: Yes (appropriate for developmental age) Does the patient have difficulty walking or climbing stairs?: No Weakness of Legs: None Weakness of Arms/Hands: None  Permission Sought/Granted   Permission granted to share information with : No              Emotional Assessment Appearance:: Appears stated age Attitude/Demeanor/Rapport: Gracious Affect (typically observed): Pleasant Orientation: : Oriented to Self, Oriented to Place, Oriented to  Time, Oriented to Situation Alcohol / Substance Use: Not Applicable Psych Involvement: No (comment)  Admission diagnosis:  Respiratory distress [R06.03] COPD exacerbation (HCC) [J44.1] Acute respiratory failure with hypoxia ([place]) [J96.01] Acute respiratory failure with hypoxemia ([place]) [J96.01] Patient Active Problem List   Diagnosis Date Noted   Acute respiratory failure with hypoxemia ([place]) 01/03/2021   Community acquired pneumonia    COPD with acute exacerbation ([place])    Hypoxia 09/22/2020   Protein-calorie malnutrition, severe 09/03/2018   Acute respiratory distress 08/31/2018  Acute respiratory failure with hypoxia ([place]) 08/31/2018   HCAP (healthcare-associated pneumonia) 08/06/2018   Emphysema of lung ([place]) 08/06/2018   Acute on chronic respiratory failure with hypoxia ([place]) 08/06/2018   Iron deficiency anemia due to chronic blood  loss 03/01/2018   Goals of care, counseling/discussion 02/09/2018   Bladder cancer ([place]) 12/13/2017   Hypertensive cardiomyopathy, 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[place]) 04/29/2016   Hypokalemia 04/02/2016   Protein-energy malnutrition ([place]) 04/02/2016   Hypertensive emergency 04/01/2016   Dizziness and giddiness 04/12/2013   Palpitations 03/27/2013   Weight loss 06/08/2012   Degenerative disc disease, lumbar 01/30/2012   Adrenal mass ([place]) 01/30/2012   Hypercalcemia 01/30/2012   Hyperproteinemia 01/30/2012   Borderline diabetes 01/30/2012   Normocytic anemia 03/11/2011   TOBACCO ABUSE 08/31/2009   Essential hypertension 08/31/2009   PCP:  [name], NP Pharmacy:   [facility] [address] [place] [address] Phone: [phone] Fax: [phone]     Social Determinants of Health (SDOH) Interventions    Readmission Risk Interventions Readmission Risk Prevention Plan 01/08/2021  Transportation Screening Complete  PCP or Specialist Appt within 3-5 Days Complete  HRI or [place] Complete  Social Work Consult for [place] Planning/Counseling Patient refused  Palliative Care Screening Not Applicable  Medication Review Press photographer) Referral to Pharmacy  Some recent data might be hidden

## 2021-01-08 NOTE — Discharge Summary (Addendum)
Physician Discharge Summary  ELIS RAWLINSON TXM:[id number] DOB: 07-12-1956 DOA: 01/03/2021  PCP: [name], NP  Admit date: 01/03/2021 Discharge date: 01/09/2021  Admitted From: Home Disposition:  Home  Recommendations for Outpatient Follow-up:  Follow up with PCP in 3-4 weeks  Discharge Condition:Improved CODE STATUS:Full Diet recommendation: Regular   Brief/Interim Summary: 65 y.o. female with medical history significant for COPD, chronic hypoxic respiratory failure on 2 L/min oxygen at home, tobacco use disorder, hypertension, bladder cancer on immunotherapy, chronic anemia, who presented to the hospital with nonproductive cough and shortness of breath.  Discharge Diagnoses:  Principal Problem:   Acute respiratory failure with hypoxia (HCC) Active Problems:   Essential hypertension   Bladder cancer (HCC)   COPD with acute exacerbation (HCC)   Acute respiratory failure with hypoxemia (HCC)  Acute respiratory failure with hypoxia, requiring BiPAP Acute COPD exacerbation - Pt had been requiring intermittent BiPAP this stay - Chest x-ray is negative - Pt is continued onSolu-Medrol IV every 12 hours. - Continued on Rocephin, azithromycin.  Pro-Cal 5.9 - Cont neb tx as needed -Weaned to baseline [facility] with clear breath sounds. Baseline O2 requirements, breathing comfortably -Plan to d/c with home O2 and to complete azithro and omnicef to complete abx.  -Complete prednisone taper on d/c   Essential hypertension - On Norvasc -BP stable   History of bladder cancer - Continued on immunotherapy.   Anemia of chronic disease - Hemoglobin stable without any active signs of bleeding -stable hemodynamically   Chronic pain - Cont on oxycodone.  As needed stool softener   Depression/anxiety - On citalopram. Continued on as needed Ativan while in hospital -Seems stable at this time    Discharge Instructions  Discharge Instructions     AMB Referral to [place]   Complete by: As directed    Referral:  [place] [Embedded]  High risk score  Please assign to [place] for chronic care management follow up calls and assess for further needs.  Questions please call:   Natividad Brood, RN BSN [place] [facility] Liaison  [phone] business mobile phone Toll free office [phone]  Fax number: [phone] [email or web address]   Reason for Referral: Embedded Chronic Care Management Services (Dept. specific)   Disease management services needed: Nurse Case Manager   Diagnoses of: COPD/ Pneumonia   Expected date of contact: Routine - 30 Days      Allergies as of 01/09/2021   No Known Allergies      Medication List     STOP taking these medications    dronabinol 5 MG capsule Commonly known as: MARINOL   lidocaine-prilocaine cream Commonly known as: EMLA   megestrol 20 MG tablet Commonly known as: MEGACE   nicotine 14 mg/24hr patch Commonly known as: NICODERM CQ - dosed in mg/24 hours       TAKE these medications    acetaminophen 500 MG tablet Commonly known as: TYLENOL Take 1,000 mg by mouth every 8 (eight) hours as needed for moderate pain.   albuterol (2.5 MG/3ML) 0.083% nebulizer solution Commonly known as: PROVENTIL Take 3 mLs (2.5 mg total) by nebulization every 6 (six) hours as needed for wheezing or shortness of breath. Use 3 times daily x 4 days then every 6 hours as needed.   amitriptyline 10 MG tablet Commonly known as: ELAVIL Take 1 tablet (10 mg total) by mouth at bedtime as needed for sleep.   amLODipine 10 MG tablet  Commonly known as: NORVASC Take 1 tablet (10 mg total) by mouth daily.   aspirin EC 81 MG tablet Take 81 mg by mouth daily.   azithromycin 250 MG tablet Commonly known as: ZITHROMAX Take 1 tablet by mouth daily for 1 more day,   benzonatate 100 MG capsule Commonly known  as: TESSALON TAKE 1 CAPSULE BY MOUTH THREE TIMES DAILY AS NEEDED What changed:  how much to take how to take this when to take this reasons to take this   cefdinir 300 MG capsule Commonly known as: OMNICEF Take 1 capsule (300 mg total) by mouth 2 (two) times daily for 1 day.   citalopram 10 MG tablet Commonly known as: CeleXA Take 1 tablet (10 mg total) by mouth daily.   diazepam 5 MG tablet Commonly known as: VALIUM Take 1 tablet (5 mg total) by mouth every 8 (eight) hours as needed for anxiety.   feeding supplement Liqd Take 237 mLs by mouth 2 (two) times daily between meals.   loratadine 10 MG tablet Commonly known as: CLARITIN Take 1 tablet (10 mg total) by mouth daily. What changed:  when to take this reasons to take this   ondansetron 8 MG tablet Commonly known as: ZOFRAN Take 1 tablet (8 mg total) by mouth every 8 (eight) hours as needed for nausea or vomiting.   oxyCODONE 5 MG immediate release tablet Commonly known as: Oxy IR/ROXICODONE TAKE 1 TABLET BY MOUTH EVERY 6 HOURS AS NEEDED FOR SEVERE PAIN What changed: how much to take   pantoprazole 40 MG tablet Commonly known as: PROTONIX Take 1 tablet (40 mg total) by mouth daily at 6 (six) AM.   predniSONE 10 MG tablet Commonly known as: DELTASONE Take 4 tablets (40 mg total) by mouth daily with breakfast for 3 days, THEN 2 tablets (20 mg total) daily with breakfast for 3 days, THEN 1 tablet (10 mg total) daily with breakfast for 3 days, THEN 0.5 tablets (5 mg total) daily with breakfast for 2 days. Start taking on: January 08, 2021   prochlorperazine 10 MG tablet Commonly known as: COMPAZINE Take 1 tablet (10 mg total) by mouth every 6 (six) hours as needed for nausea or vomiting.   Trelegy Ellipta 100-62.5-25 MCG/INH Aepb Generic drug: Fluticasone-Umeclidin-Vilant Inhale 1 puff into the lungs in the morning and at bedtime.   Vitamin D (Ergocalciferol) 1.25 MG (50000 UNIT) Caps capsule Commonly known as:  DRISDOL Take 1 capsule by mouth once a week        Follow-up Information     [name], NP. Schedule an appointment as soon as possible for a visit in 2 week(s).   Specialty: Internal Medicine Why: Hospital follow up Contact information: [address] [place] [address] [phone]         [facility] [employer] Follow up.   Why: For any problems with your home oxygen, call Jermaine at [phone] Contact information: [address] [place] [address] [phone]                No Known Allergies   Procedures/Studies: DG Chest Portable 1 View  Result Date: 01/03/2021 CLINICAL DATA:  Shortness of breath EXAM: PORTABLE CHEST 1 VIEW COMPARISON:  September 22, 2020 FINDINGS: Incomplete assessment of the RIGHT lateral soft tissues and RIGHT costophrenic angle. The cardiomediastinal silhouette is unchanged in contour.Atherosclerotic calcifications. RIGHT chest port with tip terminating over the superior cavoatrial junction. Hyperinflation. Emphysematous changes. No pleural effusion.  No pneumothorax. No acute pleuroparenchymal abnormality. Visualized abdomen is unremarkable. Multilevel degenerative changes of the thoracic spine. IMPRESSION: No acute cardiopulmonary abnormality. Electronically Signed   By: Valentino Saxon MD   On: 01/03/2021 18:56    Subjective: Eager to go home  Discharge Exam: Vitals:   01/09/21 0807 01/09/21 1020  BP:  (!) 147/114  Pulse:  (!) 103  Resp:    Temp:    SpO2: 100% 99%   Vitals:   01/09/21 0300 01/09/21 0801 01/09/21 0807 01/09/21 1020  BP:  135/85  (!) 147/114  Pulse:  83  (!) 103  Resp:  15    Temp: 97.8 F (36.6 C) 97.8 F (36.6 C)    TempSrc: Oral Oral    SpO2:  100% 100% 99%  Weight:      Height:        General: Pt is alert, awake, not in acute distress Cardiovascular: RRR, S1/S2 + Respiratory: CTA bilaterally, no wheezing, no rhonchi Abdominal: Soft, NT, ND,  bowel sounds + Extremities: no edema, no cyanosis   The results of significant diagnostics from this hospitalization (including imaging, microbiology, ancillary and laboratory) are listed below for reference.     Microbiology: Recent Results (from the past 240 hour(s))  Resp Panel by RT-PCR (Flu A&B, Covid) Nasopharyngeal Swab     Status: None   Collection Time: 01/03/21  6:45 PM   Specimen: Nasopharyngeal Swab; Nasopharyngeal(NP) swabs in vial transport medium  Result Value Ref Range Status   SARS Coronavirus 2 by RT PCR NEGATIVE NEGATIVE Final    Comment: (NOTE) SARS-CoV-2 target nucleic acids are NOT DETECTED.  The SARS-CoV-2 RNA is generally detectable in upper respiratory specimens during the acute phase of infection. The lowest concentration of SARS-CoV-2 viral copies this assay can detect is 138 copies/mL. A negative result does not preclude SARS-Cov-2 infection and should not be used as the sole basis for treatment or other patient management decisions. A negative result may occur with  improper specimen collection/handling, submission of specimen other than nasopharyngeal swab, presence of viral mutation(s) within the areas targeted by this assay, and inadequate number of viral copies(<138 copies/mL). A negative result must be combined with clinical observations, patient history, and epidemiological information. The expected result is Negative.  Fact Sheet for Patients:  EntrepreneurPulse.com.au  Fact Sheet for Healthcare Providers:  IncredibleEmployment.be  This test is no t yet approved or cleared by the [place] FDA and  has been authorized for detection and/or diagnosis of SARS-CoV-2 by FDA under an Emergency Use Authorization (EUA). This EUA will remain  in effect (meaning this test can be used) for the duration of the COVID-19 declaration under Section 564(b)(1) of the Act, 21 U.S.C.section 360bbb-3(b)(1), unless the  authorization is terminated  or revoked sooner.       Influenza A by PCR NEGATIVE NEGATIVE Final   Influenza B by PCR NEGATIVE NEGATIVE Final    Comment: (NOTE) The Xpert Xpress SARS-CoV-2/FLU/RSV plus assay is intended as an aid in the diagnosis of influenza from Nasopharyngeal swab specimens and should not be used as a sole basis for treatment. Nasal washings and aspirates are unacceptable for Xpert Xpress SARS-CoV-2/FLU/RSV testing.  Fact Sheet for Patients: EntrepreneurPulse.com.au  Fact Sheet for Healthcare Providers: IncredibleEmployment.be  This test is not yet approved or cleared by the [place] FDA and has been authorized for detection and/or diagnosis of SARS-CoV-2 by FDA under an Emergency Use Authorization (EUA). This EUA will remain in effect (meaning this test can be  used) for the duration of the COVID-19 declaration under Section 564(b)(1) of the Act, 21 U.S.C. section 360bbb-3(b)(1), unless the authorization is terminated or revoked.  Performed at [facility], [place] [address] [place], [place] [address]      Labs: BNP (last 3 results) Recent Labs    01/05/21 0100  BNP 69.6   Basic Metabolic Panel: Recent Labs  Lab 01/05/21 0100 01/06/21 0500 01/07/21 0213 01/08/21 0243 01/09/21 0451  NA 140 141 138 136 136  K 4.1 4.9 5.4* 5.0 4.9  CL 100 96* 96* 93* 92*  CO2 32 36* 34* 36* 36*  GLUCOSE 115* 123* 131* 130* 86  BUN 20 32* 27* 20 21  CREATININE 0.80 0.81 0.75 0.72 0.74  CALCIUM 9.5 9.1 9.4 9.4 9.2  MG 2.2 2.2 2.2 2.2 2.4   Liver Function Tests: Recent Labs  Lab 01/03/21 1837  AST 24  ALT 16  ALKPHOS 90  BILITOT 0.4  PROT 6.9  ALBUMIN 3.2*   No results for input(s): LIPASE, AMYLASE in the last 168 hours. No results for input(s): AMMONIA in the last 168 hours. CBC: Recent Labs  Lab 01/03/21 1837 01/03/21 1858 01/05/21 0100 01/06/21 0500 01/07/21 0213 01/08/21 0243 01/09/21 0451   WBC 16.0*   < > 12.7* 11.0* 7.6 5.1 3.9*  NEUTROABS 13.1*  --   --   --   --   --   --   HGB 11.8*   < > 11.2* 10.5* 12.2 11.2* 11.7*  HCT 37.8   < > 35.9* 33.5* 38.4 35.5* 36.4  MCV 94.0   < > 92.3 92.8 91.9 90.6 90.8  PLT 313   < > 259 272 257 253 250   < > = values in this interval not displayed.   Cardiac Enzymes: No results for input(s): CKTOTAL, CKMB, CKMBINDEX, TROPONINI in the last 168 hours. BNP: Invalid input(s): POCBNP CBG: Recent Labs  Lab 01/05/21 1621  GLUCAP 103*   D-Dimer No results for input(s): DDIMER in the last 72 hours. Hgb A1c No results for input(s): HGBA1C in the last 72 hours. Lipid Profile No results for input(s): CHOL, HDL, LDLCALC, TRIG, CHOLHDL, LDLDIRECT in the last 72 hours. Thyroid function studies No results for input(s): TSH, T4TOTAL, T3FREE, THYROIDAB in the last 72 hours.  Invalid input(s): FREET3 Anemia work up No results for input(s): VITAMINB12, FOLATE, FERRITIN, TIBC, IRON, RETICCTPCT in the last 72 hours. Urinalysis    Component Value Date/Time   COLORURINE YELLOW 09/30/2020 1137   APPEARANCEUR [place] Cloud[place] (A) 09/30/2020 1137   LABSPEC 1.015 09/30/2020 1137   PHURINE 7.0 09/30/2020 1137   GLUCOSEU NEGATIVE 09/30/2020 1137   HGBUR NEGATIVE 09/30/2020 1137   BILIRUBINUR NEGATIVE 09/30/2020 1137   BILIRUBINUR 1+ 11/30/2017 1348   KETONESUR NEGATIVE 09/30/2020 1137   PROTEINUR NEGATIVE 02/06/2019 1429   UROBILINOGEN 0.2 09/30/2020 1137   NITRITE NEGATIVE 09/30/2020 1137   LEUKOCYTESUR MODERATE (A) 09/30/2020 1137   Sepsis Labs Invalid input(s): PROCALCITONIN,  WBC,  LACTICIDVEN Microbiology Recent Results (from the past 240 hour(s))  Resp Panel by RT-PCR (Flu A&B, Covid) Nasopharyngeal Swab     Status: None   Collection Time: 01/03/21  6:45 PM   Specimen: Nasopharyngeal Swab; Nasopharyngeal(NP) swabs in vial transport medium  Result Value Ref Range Status   SARS Coronavirus 2 by RT PCR NEGATIVE NEGATIVE Final    Comment:  (NOTE) SARS-CoV-2 target nucleic acids are NOT DETECTED.  The SARS-CoV-2 RNA is generally detectable in upper respiratory specimens during  the acute phase of infection. The lowest concentration of SARS-CoV-2 viral copies this assay can detect is 138 copies/mL. A negative result does not preclude SARS-Cov-2 infection and should not be used as the sole basis for treatment or other patient management decisions. A negative result may occur with  improper specimen collection/handling, submission of specimen other than nasopharyngeal swab, presence of viral mutation(s) within the areas targeted by this assay, and inadequate number of viral copies(<138 copies/mL). A negative result must be combined with clinical observations, patient history, and epidemiological information. The expected result is Negative.  Fact Sheet for Patients:  EntrepreneurPulse.com.au  Fact Sheet for Healthcare Providers:  IncredibleEmployment.be  This test is no t yet approved or cleared by the [place] FDA and  has been authorized for detection and/or diagnosis of SARS-CoV-2 by FDA under an Emergency Use Authorization (EUA). This EUA will remain  in effect (meaning this test can be used) for the duration of the COVID-19 declaration under Section 564(b)(1) of the Act, 21 U.S.C.section 360bbb-3(b)(1), unless the authorization is terminated  or revoked sooner.       Influenza A by PCR NEGATIVE NEGATIVE Final   Influenza B by PCR NEGATIVE NEGATIVE Final    Comment: (NOTE) The Xpert Xpress SARS-CoV-2/FLU/RSV plus assay is intended as an aid in the diagnosis of influenza from Nasopharyngeal swab specimens and should not be used as a sole basis for treatment. Nasal washings and aspirates are unacceptable for Xpert Xpress SARS-CoV-2/FLU/RSV testing.  Fact Sheet for Patients: EntrepreneurPulse.com.au  Fact Sheet for Healthcare  Providers: IncredibleEmployment.be  This test is not yet approved or cleared by the [place] FDA and has been authorized for detection and/or diagnosis of SARS-CoV-2 by FDA under an Emergency Use Authorization (EUA). This EUA will remain in effect (meaning this test can be used) for the duration of the COVID-19 declaration under Section 564(b)(1) of the Act, 21 U.S.C. section 360bbb-3(b)(1), unless the authorization is terminated or revoked.  Performed at [facility], [place] [address], [place], [place] [address]    Time spent: 76min  SIGNED:   Marylu Lund, MD  [facility] 01/09/2021, 12:21 PM  If 7PM-7AM, please contact night-coverage

## 2021-01-09 LAB — CBC
HCT: 36.4 % (ref 36.0–46.0)
Hemoglobin: 11.7 g/dL — ABNORMAL LOW (ref 12.0–15.0)
MCH: 29.2 pg (ref 26.0–34.0)
MCHC: 32.1 g/dL (ref 30.0–36.0)
MCV: 90.8 fL (ref 80.0–100.0)
Platelets: 250 10*3/uL (ref 150–400)
RBC: 4.01 MIL/uL (ref 3.87–5.11)
RDW: 14.5 % (ref 11.5–15.5)
WBC: 3.9 10*3/uL — ABNORMAL LOW (ref 4.0–10.5)
nRBC: 0 % (ref 0.0–0.2)

## 2021-01-09 LAB — BASIC METABOLIC PANEL
Anion gap: 8 (ref 5–15)
BUN: 21 mg/dL (ref 8–23)
CO2: 36 mmol/L — ABNORMAL HIGH (ref 22–32)
Calcium: 9.2 mg/dL (ref 8.9–10.3)
Chloride: 92 mmol/L — ABNORMAL LOW (ref 98–111)
Creatinine, Ser: 0.74 mg/dL (ref 0.44–1.00)
GFR, Estimated: >60 mL/min (ref >60)
Glucose, Bld: 86 mg/dL (ref 70–99)
Potassium: 4.9 mmol/L (ref 3.5–5.1)
Sodium: 136 mmol/L (ref 135–145)

## 2021-01-09 LAB — MAGNESIUM: Magnesium: 2.4 mg/dL (ref 1.7–2.4)

## 2021-01-09 MED ORDER — HEPARIN SOD (PORK) LOCK FLUSH 100 UNIT/ML IV SOLN
500.0000 [IU] | INTRAVENOUS | Status: AC | PRN
  Administered 2021-01-09: 500 [IU]
  Filled 2021-01-09: qty 5

## 2021-01-09 MED ORDER — HEPARIN SOD (PORK) LOCK FLUSH 100 UNIT/ML IV SOLN
500.0000 [IU] | INTRAVENOUS | Status: DC | PRN
  Filled 2021-01-09: qty 5

## 2021-01-09 NOTE — TOC Progression Note (Addendum)
Transition of Care [facility]) - Progression Note    Patient Details  Name: Donna Curry MRN: [id number] Date of Birth: 11-25-1955  Transition of Care [facility]) CM/SW Contact  Carles Collet, RN Phone Number: 01/09/2021, 10:00 AM  Clinical Narrative:    Damaris Schooner w patient at bedside and her sister Lelon Frohlich on speaker phone. Patient has multiple portable O2 devices and concentrator at home. She uses it constantly at 4 liters. She thinks she is services through [facility] can only find records for nebulizer. Sister Lelon Frohlich is going to go home and take a few photos of her equipment and text them to me so we can hopefully identify O2 supplier.  Previous Epic records do not provide name of supplier.   Patient uses [employer] for home oxygen. Brenton Grills will bring tank for transportation home up to the hospital room today. Sister Lelon Frohlich was made aware.     Expected Discharge Plan: [place] Barriers to Discharge: No Barriers Identified  Expected Discharge Plan and Services Expected Discharge Plan: [place] In-house Referral: NA Discharge Planning Services: CM Consult Post Acute Care Choice: [place] arrangements for the past 2 months: Single Family Home Expected Discharge Date: 01/08/21               DME Arranged: N/A DME Agency: NA       HH Arranged: Refused [place] Agency: NA         Social Determinants of Health (SDOH) Interventions    Readmission Risk Interventions Readmission Risk Prevention Plan 01/08/2021  Transportation Screening Complete  PCP or Specialist Appt within 3-5 Days Complete  HRI or [place] Complete  Social Work Consult for [place] Planning/Counseling Patient refused  Palliative Care Screening Not Applicable  Medication Review Press photographer) Referral to Pharmacy  Some recent data might be hidden

## 2021-01-09 NOTE — Progress Notes (Signed)
Occupational Therapy Treatment Patient Details Name: Donna Curry MRN: [id number] DOB: 12/29/1955 Today's Date: 01/09/2021    History of present illness 65 yo admitted 6/12 with SOB and COPD exacerbation. PMhx: COPD with chronic respiratory failure on 2L at home, HTN, bladder CA on immunotherapy, anemai   OT comments  Patient progressing well toward goals. Completes functional transfers with RW and supervision to Min guard this date. Patient also tolerated 2 sets x 10 reps each of BUE HEP seated in recliner. Noted STM deficits. Patient reports managing medications independently. Daughter assists with managing finances. Will complete Pillbox test at time of next treatment session.    Follow Up Recommendations  Home health OT;Supervision - Intermittent    Equipment Recommendations  None recommended by OT (Patient has necessary DME.)    Recommendations for Other Services      Precautions / Restrictions Precautions Precautions: Fall Precaution Comments: watch vitals Restrictions Weight Bearing Restrictions: No       Mobility Bed Mobility Overal bed mobility: Modified Independent             General bed mobility comments: HOB flat.    Transfers Overall transfer level: Needs assistance   Transfers: Sit to/from Stand;Stand Pivot Transfers Sit to Stand: Supervision Stand pivot transfers: Supervision       General transfer comment: Supervision A for line management.    Balance Overall balance assessment: Needs assistance Sitting-balance support: No upper extremity supported;Feet unsupported;Feet supported Sitting balance-Leahy Scale: Good     Standing balance support: No upper extremity supported;During functional activity Standing balance-Leahy Scale: Fair Standing balance comment: Able to maintain static standing balnace without UE support. Reliant on unilateral UE support with dynamic tasks.                           ADL either performed or  assessed with clinical judgement   ADL Overall ADL's : Needs assistance/impaired Eating/Feeding: Set up;Sitting   Grooming: Wash/dry face;Wash/dry hands;Min guard;Supervision/safety Grooming Details (indicate cue type and reason): Min guard for 2/3 grooming tasks standing at sink level with unilateral UE support on sink surface.                               General ADL Comments: Patient limited by decreased activity tolerance and generalized weakness.     Vision       Perception     Praxis      Cognition Arousal/Alertness: Awake/alert Behavior During Therapy: WFL for tasks assessed/performed Overall Cognitive Status: No family/caregiver present to determine baseline cognitive functioning Area of Impairment: Safety/judgement                               General Comments: Patient with noted STM deficits. 2/3 on BIMs. No family present to confirm baseline cognition. Patient reports managing her own medications.        Exercises Exercises: General Upper Extremity;General Lower Extremity General Exercises - Upper Extremity Shoulder Flexion: AROM;Both;10 reps Elbow Flexion: AROM;Both;10 reps Elbow Extension: AROM;Both;10 reps General Exercises - Lower Extremity Straight Leg Raises: AROM;10 reps;Both   Shoulder Instructions       General Comments HR 100's with light activity. SpO2 stable on 2L O2 via [place].    Pertinent Vitals/ Pain       Pain Assessment: No/denies pain  Home Living  Prior Functioning/Environment              Frequency  Min 2X/week        Progress Toward Goals  OT Goals(current goals can now be found in the care plan section)  Progress towards OT goals: Progressing toward goals  Acute Rehab OT Goals Patient Stated Goal: To return home. OT Goal Formulation: With patient Time For Goal Achievement: 01/22/21 Potential to Achieve Goals: Good ADL  Goals Additional ADL Goal #1: Patient will complete ADLs with Mod I and AD PRN. Additional ADL Goal #2: Patient will recall 3/3 energy conservation techniques in prep for ADLs.  Plan Discharge plan remains appropriate;Frequency remains appropriate    Co-evaluation                 AM-PAC OT "6 Clicks" Daily Activity     Outcome Measure   Help from another person eating meals?: None Help from another person taking care of personal grooming?: A Little Help from another person toileting, which includes using toliet, bedpan, or urinal?: A Little Help from another person bathing (including washing, rinsing, drying)?: A Little Help from another person to put on and taking off regular upper body clothing?: A Little Help from another person to put on and taking off regular lower body clothing?: A Little 6 Click Score: 19    End of Session Equipment Utilized During Treatment: Gait belt  OT Visit Diagnosis: Unsteadiness on feet (R26.81);Muscle weakness (generalized) (M62.81)   Activity Tolerance Patient tolerated treatment well   Patient Left in chair;with call bell/phone within reach;with chair alarm set   Nurse Communication Mobility status        Time: 4270-6237 OT Time Calculation (min): 23 min  Charges: OT General Charges $OT Visit: 1 Visit OT Treatments $Self Care/Home Management : 8-22 mins $Therapeutic Exercise: 8-22 mins  [name] H. OTR/L Supplemental OT, Department of rehab services [phone]   [name] H. 01/09/2021, 9:29 AM

## 2021-01-11 ENCOUNTER — Telehealth: Payer: Self-pay

## 2021-01-11 ENCOUNTER — Other Ambulatory Visit (HOSPITAL_COMMUNITY): Payer: Self-pay

## 2021-01-11 ENCOUNTER — Other Ambulatory Visit: Payer: Self-pay | Admitting: *Deleted

## 2021-01-11 DIAGNOSIS — C672 Malignant neoplasm of lateral wall of bladder: Secondary | ICD-10-CM

## 2021-01-11 MED ORDER — OXYCODONE HCL 5 MG PO TABS: ORAL_TABLET | Freq: Four times a day (QID) | ORAL | 0 refills | Status: DC | PRN

## 2021-01-11 NOTE — Telephone Encounter (Signed)
Transition Care Management Unsuccessful Follow-up Telephone Call  Date of discharge and from where: 01/09/21 from [facility]    Attempts:  1st Attempt  Reason for unsuccessful TCM follow-up call:  No answer/busy

## 2021-01-12 ENCOUNTER — Telehealth: Payer: Self-pay

## 2021-01-13 ENCOUNTER — Telehealth: Payer: Self-pay

## 2021-01-13 NOTE — Telephone Encounter (Signed)
Transition Care Management Follow-up Telephone Call Date of discharge and from where: 01/09/21 - How have you been since you were released from the hospital? Pretty good Any questions or concerns? No  Items Reviewed: Did the pt receive and understand the discharge instructions provided? Yes  Medications obtained and verified? Yes  Other? Yes  Any new allergies since your discharge? No  Dietary orders reviewed? Yes Do you have support at home? Yes   Home Care and Equipment/Supplies: Were home health services ordered? no If so, what is the name of the agency? N/a  Has the agency set up a time to come to the patient's home? not applicable Were any new equipment or medical supplies ordered?  Yes: oxygen What is the name of the medical supply agency? [employer] Were you able to get the supplies/equipment? yes Do you have any questions related to the use of the equipment or supplies? No  Functional Questionnaire: (I = Independent and D = Dependent) ADLs: I  Bathing/Dressing- I  Meal Prep- I  Eating- I  Maintaining continence- I  Transferring/Ambulation- I  Managing Meds- I  Follow up appointments reviewed:  PCP Hospital f/u appt confirmed? Yes  Scheduled to see [name]  on 01/15/21 @ 1:20. [facility] f/u appt confirmed?  N/a   Are transportation arrangements needed? No  If their condition worsens, is the pt aware to call PCP or go to the Emergency Dept.? Yes Was the patient provided with contact information for the PCP's office or ED? Yes Was to pt encouraged to call back with questions or concerns? Yes

## 2021-01-14 DIAGNOSIS — J449 Chronic obstructive pulmonary disease, unspecified: Secondary | ICD-10-CM | POA: Diagnosis not present

## 2021-01-14 DIAGNOSIS — J9601 Acute respiratory failure with hypoxia: Secondary | ICD-10-CM | POA: Diagnosis not present

## 2021-01-14 DIAGNOSIS — R64 Cachexia: Secondary | ICD-10-CM | POA: Diagnosis not present

## 2021-01-14 DIAGNOSIS — Z9981 Dependence on supplemental oxygen: Secondary | ICD-10-CM | POA: Diagnosis not present

## 2021-01-15 ENCOUNTER — Ambulatory Visit (INDEPENDENT_AMBULATORY_CARE_PROVIDER_SITE_OTHER): Payer: Medicare Other | Admitting: Family

## 2021-01-15 ENCOUNTER — Other Ambulatory Visit: Payer: Self-pay

## 2021-01-15 VITALS — BP 146/84 | HR 104 | Temp 98.9°F | Resp 16 | Wt 72.8 lb

## 2021-01-15 DIAGNOSIS — C671 Malignant neoplasm of dome of bladder: Secondary | ICD-10-CM | POA: Diagnosis not present

## 2021-01-15 DIAGNOSIS — J9621 Acute and chronic respiratory failure with hypoxia: Secondary | ICD-10-CM

## 2021-01-15 DIAGNOSIS — Z7185 Encounter for immunization safety counseling: Secondary | ICD-10-CM | POA: Diagnosis not present

## 2021-01-15 MED ORDER — CITALOPRAM HYDROBROMIDE 10 MG PO TABS: 10.0000 mg | ORAL_TABLET | Freq: Every day | ORAL | 1 refills | Status: DC

## 2021-01-15 MED ORDER — ALBUTEROL SULFATE (2.5 MG/3ML) 0.083% IN NEBU: 2.5000 mg | INHALATION_SOLUTION | Freq: Four times a day (QID) | RESPIRATORY_TRACT | 1 refills | Status: DC | PRN

## 2021-01-15 MED ORDER — BENZONATATE 100 MG PO CAPS: 100.0000 mg | ORAL_CAPSULE | Freq: Two times a day (BID) | ORAL | 1 refills | Status: DC | PRN

## 2021-01-15 NOTE — Progress Notes (Addendum)
Subjective:   By signing my name below, I, Shehryar Baig, attest that this documentation has been prepared under the direction and in the presence of [name] NP. 01/15/2021     Patient ID: Donna Curry, female    DOB: 1955-09-06, 65 y.o.   MRN: [id number]  Chief Complaint  Patient presents with   Follow-up    Here for hospital follow up    HPI Patient is in today for a hospital follow up. She was admitted on 01/03/2021 due to her exacerbation of her COPD. She was placed on bipap during her hospitalization. She was sent home on oxygen but she did not bring her portable oxygen to today's appointment. She has completed her anti-biotic course given to her from the hospital. She has a few days left on her prednisone taper. She continues using trelegy and albuterol MDI. She is requesting a refill on albuterol.   Depression- She reports her mood doing well while taking 10 mg citalopram daily PO.  Coughing- She continues coughing and also reports having chest congestion. She is requesting cough medication to help manage her symptoms.  Hypertension- She continues taking 10 mg amlodipine daily PO.  Immunizations- She does not have the Covid-19 vaccines at this time.   Health Maintenance Due  Topic Date Due   COVID-19 Vaccine (1) Never done   Pneumococcal Vaccine 65-67 Years old (1 - PCV) Never done   Hepatitis C Screening  Never done   Zoster Vaccines- Shingrix (1 of 2) Never done   PAP SMEAR-Modifier  Never done   COLONOSCOPY (Pts 45-36yrs Insurance coverage will need to be confirmed)  Never done   MAMMOGRAM  11/15/2019    Past Medical History:  Diagnosis Date   Anemia    Asthma    Dyspnea    with exertion    Family history of adverse reaction to anesthesia    brother wakes up and dont know who he is and sister has n/v   Goals of care, counseling/discussion 02/09/2018   History of blood transfusion    Hypertension    Iron deficiency anemia due to chronic blood loss  03/01/2018   Measles as a child   Mumps as a child   TIA (transient ischemic attack)    Tobacco abuse     Past Surgical History:  Procedure Laterality Date   CYSTOSCOPY W/ URETERAL STENT PLACEMENT Right 12/13/2017   Procedure: CYSTOSCOPY WITH RIGHT RETROGRADE PYELOGRAM/URETERAL RIGHT STENT PLACEMENT;  Surgeon: [name], MD;  Location: [facility];  Service: Urology;  Laterality: Right;   IR IMAGING GUIDED PORT INSERTION  02/23/2018   TRANSURETHRAL RESECTION OF BLADDER TUMOR N/A 12/13/2017   Procedure: TRANSURETHRAL RESECTION OF BLADDER TUMOR (TURBT);  Surgeon: [name], MD;  Location: [facility];  Service: Urology;  Laterality: N/A;   TRANSURETHRAL RESECTION OF BLADDER TUMOR N/A 10/03/2018   Procedure: TRANSURETHRAL RESECTION OF BLADDER TUMOR (TURBT);  Surgeon: [name], MD;  Location: [facility];  Service: Urology;  Laterality: N/A;   TRANSURETHRAL RESECTION OF BLADDER TUMOR WITH GYRUS (TURBT-GYRUS)     [name] 12-13-17   tubes tied     uterine ablation     about 2005    Family History  Problem Relation Age of Onset   Hypertension Mother    Diabetes Father    Hypertension Brother    HIV Brother     Social History   Socioeconomic History   Marital status: Single  Spouse name: Not on file   Number of children: Not on file   Years of education: Not on file   Highest education level: Not on file  Occupational History   Not on file  Tobacco Use   Smoking status: Every Day    Packs/day: 0.25    Pack years: 0.00    Types: Cigarettes   Smokeless tobacco: Never   Tobacco comments:    smoking 3 cigarettes a day  Vaping Use   Vaping Use: Former  Substance and Sexual Activity   Alcohol use: No    Alcohol/week: 0.0 standard drinks   Drug use: Never   Sexual activity: Not Currently  Other Topics Concern   Not on file  Social History Narrative   Denies hx of drug use   Single   1 daughter age 79 lives with daughter and grandson who is 58.   Works as a  Sports coach for [employer].   Completed 12th grade.   Social Determinants of Health   Financial Resource Strain: Not on file  Food Insecurity: Not on file  Transportation Needs: Not on file  Physical Activity: Not on file  Stress: Not on file  Social Connections: Not on file  Intimate Partner Violence: Not on file    Outpatient Medications Prior to Visit  Medication Sig Dispense Refill   acetaminophen (TYLENOL) 500 MG tablet Take 1,000 mg by mouth every 8 (eight) hours as needed for moderate pain.     amitriptyline (ELAVIL) 10 MG tablet Take 1 tablet (10 mg total) by mouth at bedtime as needed for sleep. 30 tablet 1   amLODipine (NORVASC) 10 MG tablet Take 1 tablet (10 mg total) by mouth daily. 90 tablet 0   aspirin EC 81 MG tablet Take 81 mg by mouth daily.     diazepam (VALIUM) 5 MG tablet Take 1 tablet (5 mg total) by mouth every 8 (eight) hours as needed for anxiety. 40 tablet 0   feeding supplement (ENSURE ENLIVE / ENSURE PLUS) LIQD Take 237 mLs by mouth 2 (two) times daily between meals. 237 mL 12   Fluticasone-Umeclidin-Vilant (TRELEGY ELLIPTA) 100-62.5-25 MCG/INH AEPB Inhale 1 puff into the lungs in the morning and at bedtime. 60 each 5   loratadine (CLARITIN) 10 MG tablet Take 1 tablet (10 mg total) by mouth daily. 20 tablet 0   ondansetron (ZOFRAN) 8 MG tablet Take 1 tablet (8 mg total) by mouth every 8 (eight) hours as needed for nausea or vomiting. 20 tablet 0   oxyCODONE (OXY IR/ROXICODONE) 5 MG immediate release tablet TAKE 1 TABLET BY MOUTH EVERY 6 HOURS AS NEEDED FOR SEVERE PAIN 30 tablet 0   pantoprazole (PROTONIX) 40 MG tablet Take 1 tablet (40 mg total) by mouth daily at 6 (six) AM. 30 tablet 1   predniSONE (DELTASONE) 10 MG tablet Take 4 tablets (40 mg total) by mouth daily with breakfast for 3 days, THEN 2 tablets (20 mg total) daily with breakfast for 3 days, THEN 1 tablet (10 mg total) daily with breakfast for 3 days, THEN 0.5 tablets (5 mg total) daily with  breakfast for 2 days. 22 tablet 0   prochlorperazine (COMPAZINE) 10 MG tablet Take 1 tablet (10 mg total) by mouth every 6 (six) hours as needed for nausea or vomiting. 30 tablet 0   Vitamin D, Ergocalciferol, (DRISDOL) 1.25 MG (50000 UT) CAPS capsule Take 1 capsule by mouth once a week 12 capsule 0   albuterol (PROVENTIL) (2.5 MG/3ML) 0.083%  nebulizer solution Take 3 mLs (2.5 mg total) by nebulization every 6 (six) hours as needed for wheezing or shortness of breath. Use 3 times daily x 4 days then every 6 hours as needed. 75 mL 1   citalopram (CELEXA) 10 MG tablet Take 1 tablet (10 mg total) by mouth daily. 90 tablet 1   azithromycin (ZITHROMAX) 250 MG tablet Take 1 tablet by mouth daily for 1 more day, 1 each 0   benzonatate (TESSALON) 100 MG capsule TAKE 1 CAPSULE BY MOUTH THREE TIMES DAILY AS NEEDED 60 capsule 0   No facility-administered medications prior to visit.    No Known Allergies  Review of Systems  Respiratory:  Positive for cough and sputum production.        (+)Chest tightness      Objective:    Physical Exam Constitutional:      General: She is not in acute distress.    Appearance: Normal appearance. She is cachectic. She is not ill-appearing.  HENT:     Head: Normocephalic and atraumatic.     Right Ear: External ear normal.     Left Ear: External ear normal.  Eyes:     Extraocular Movements: Extraocular movements intact.     Pupils: Pupils are equal, round, and reactive to light.  Cardiovascular:     Rate and Rhythm: Normal rate and regular rhythm.     Pulses: Normal pulses.     Heart sounds: Normal heart sounds. No murmur heard.   No gallop.  Pulmonary:     Effort: Pulmonary effort is normal. No respiratory distress.     Breath sounds: Examination of the right-upper field reveals rhonchi. Examination of the left-upper field reveals rhonchi. Rhonchi (coarse) present. No wheezing or rales.  Skin:    General: Skin is warm and dry.  Neurological:     Mental  Status: She is alert and oriented to person, place, and time.  Psychiatric:        Behavior: Behavior normal.    BP (!) 146/84 (BP Location: Left Arm, Patient Position: Sitting, Cuff Size: Small)   Pulse (!) 104   Temp 98.9 F (37.2 C) (Oral)   Resp 16   Wt 72 lb 12.8 oz (33 kg)   SpO2 94%   BMI 12.11 kg/m  Wt Readings from Last 3 Encounters:  01/15/21 72 lb 12.8 oz (33 kg)  01/04/21 65 lb 14.4 oz (29.9 kg)  11/10/20 75 lb (34 kg)       Assessment & Plan:   Problem List Items Addressed This Visit       Unprioritized   Vaccine counseling - Primary    Strongly recommended that she get vaccinated against covid given her multiple risk factors and frail health.        Bladder cancer [facility])    She continues to follow with oncology. I asked her to reach out to her oncologist to see if he wants her to restart megace, marinol and to refill valium.        Acute on chronic respiratory failure with hypoxia (HCC)    Clinically improved. She is on the last day of omnicef and azithromycin.  Last CXR in the hospital was clear.  She is using oxygen PRN. She is on RA at the time her her visit today and oxygen saturation is 94%.          Meds ordered this encounter  Medications   benzonatate (TESSALON) 100 MG capsule    Sig: Take  1 capsule (100 mg total) by mouth 2 (two) times daily as needed for cough.    Dispense:  30 capsule    Refill:  1    Order Specific Question:   Supervising Provider    Answer:   Penni Homans A [[id number]]   citalopram (CELEXA) 10 MG tablet    Sig: Take 1 tablet (10 mg total) by mouth daily.    Dispense:  90 tablet    Refill:  1    Order Specific Question:   Supervising Provider    Answer:   Penni Homans A [[id number]]   albuterol (PROVENTIL) (2.5 MG/3ML) 0.083% nebulizer solution    Sig: Take 3 mLs (2.5 mg total) by nebulization every 6 (six) hours as needed for wheezing or shortness of breath. Use 3 times daily x 4 days then every 6 hours as needed.     Dispense:  75 mL    Refill:  1    Order Specific Question:   Supervising Provider    Answer:   Penni Homans A [[id number]]    I, [name] NP, personally preformed the services described in this documentation.  All medical record entries made by the scribe were at my direction and in my presence.  I have reviewed the chart and discharge instructions (if applicable) and agree that the record reflects my personal performance and is accurate and complete. 01/15/2021   I,Shehryar Baig,acting as a Education administrator for Nance Pear, NP.,have documented all relevant documentation on the behalf of Nance Pear, NP,as directed by  Nance Pear, NP while in the presence of Nance Pear, NP.   Nance Pear, NP

## 2021-01-15 NOTE — Assessment & Plan Note (Signed)
Clinically improved. She is on the last day of omnicef and azithromycin.  Last CXR in the hospital was clear.  She is using oxygen PRN. She is on RA at the time her her visit today and oxygen saturation is 94%.

## 2021-01-15 NOTE — Assessment & Plan Note (Addendum)
She continues to follow with oncology. I asked her to reach out to her oncologist to see if he wants her to restart megace, marinol and to refill valium.

## 2021-01-15 NOTE — Assessment & Plan Note (Signed)
Strongly recommended that she get vaccinated against covid given her multiple risk factors and frail health.

## 2021-01-18 ENCOUNTER — Telehealth: Payer: Self-pay | Admitting: *Deleted

## 2021-01-18 NOTE — Chronic Care Management (AMB) (Signed)
  Chronic Care Management   Note  01/18/2021 Name: FATE GALANTI MRN: [id number] DOB: 1955/09/18  ALIZABETH ANTONIO is a 65 y.o. year old female who is a primary care patient of [name], NP. I reached out to Rayburn Go by phone today in response to a referral sent by Ms. Baltazar Apo Croy's PCP [name], NP     Ms. Leppanen was given information about Chronic Care Management services today including:  CCM service includes personalized support from designated clinical staff supervised by her physician, including individualized plan of care and coordination with other care providers 24/7 contact phone numbers for assistance for urgent and routine care needs. Service will only be billed when office clinical staff spend 20 minutes or more in a month to coordinate care. Only one practitioner may furnish and bill the service in a calendar month. The patient may stop CCM services at any time (effective at the end of the month) by phone call to the office staff. The patient will be responsible for cost sharing (co-pay) of up to 20% of the service fee (after annual deductible is met).  Patient agreed to services and verbal consent obtained.   Follow up plan: Telephone appointment with care management team member scheduled for:01/22/2021  Julian Hy, [facility]  Direct Dial: [phone]

## 2021-01-22 ENCOUNTER — Telehealth: Payer: Self-pay | Admitting: Family

## 2021-01-22 ENCOUNTER — Ambulatory Visit (INDEPENDENT_AMBULATORY_CARE_PROVIDER_SITE_OTHER): Payer: Medicare Other

## 2021-01-22 DIAGNOSIS — I10 Essential (primary) hypertension: Secondary | ICD-10-CM | POA: Diagnosis not present

## 2021-01-22 DIAGNOSIS — J9621 Acute and chronic respiratory failure with hypoxia: Secondary | ICD-10-CM

## 2021-01-22 DIAGNOSIS — C671 Malignant neoplasm of dome of bladder: Secondary | ICD-10-CM

## 2021-01-22 DIAGNOSIS — J449 Chronic obstructive pulmonary disease, unspecified: Secondary | ICD-10-CM

## 2021-01-22 NOTE — Chronic Care Management (AMB) (Signed)
Chronic Care Management   CCM RN Visit Note  01/22/2021 Name: Donna Curry MRN: [id number] DOB: 09-24-1955  Subjective: Donna Curry is a 65 y.o. year old female who is a primary care patient of [name], NP. The care management team was consulted for assistance with disease management and care coordination needs.    Engaged with patient by telephone for initial visit in response to provider referral for case management and/or care coordination services.   Consent to Services:  The patient was given the following information about Chronic Care Management services today, agreed to services, and gave verbal consent: 1. CCM service includes personalized support from designated clinical staff supervised by the primary care provider, including individualized plan of care and coordination with other care providers 2. 24/7 contact phone numbers for assistance for urgent and routine care needs. 3. Service will only be billed when office clinical staff spend 20 minutes or more in a month to coordinate care. 4. Only one practitioner may furnish and bill the service in a calendar month. 5.The patient may stop CCM services at any time (effective at the end of the month) by phone call to the office staff. 6. The patient will be responsible for cost sharing (co-pay) of up to 20% of the service fee (after annual deductible is met). Patient agreed to services and consent obtained.  Patient agreed to services and verbal consent obtained.   Assessment: Review of patient past medical history, allergies, medications, health status, including review of consultants reports, laboratory and other test data, was performed as part of comprehensive evaluation and provision of chronic care management services.   SDOH (Social Determinants of Health) assessments and interventions performed:    CCM Care Plan  No Known Allergies  Outpatient Encounter Medications as of 01/22/2021  Medication Sig    acetaminophen (TYLENOL) 500 MG tablet Take 1,000 mg by mouth every 8 (eight) hours as needed for moderate pain.   albuterol (PROVENTIL) (2.5 MG/3ML) 0.083% nebulizer solution Take 3 mLs (2.5 mg total) by nebulization every 6 (six) hours as needed for wheezing or shortness of breath. Use 3 times daily x 4 days then every 6 hours as needed.   amitriptyline (ELAVIL) 10 MG tablet Take 1 tablet (10 mg total) by mouth at bedtime as needed for sleep.   amLODipine (NORVASC) 10 MG tablet Take 1 tablet (10 mg total) by mouth daily.   aspirin EC 81 MG tablet Take 81 mg by mouth daily.   benzonatate (TESSALON) 100 MG capsule Take 1 capsule (100 mg total) by mouth 2 (two) times daily as needed for cough.   citalopram (CELEXA) 10 MG tablet Take 1 tablet (10 mg total) by mouth daily.   diazepam (VALIUM) 5 MG tablet Take 1 tablet (5 mg total) by mouth every 8 (eight) hours as needed for anxiety.   feeding supplement (ENSURE ENLIVE / ENSURE PLUS) LIQD Take 237 mLs by mouth 2 (two) times daily between meals.   Fluticasone-Umeclidin-Vilant (TRELEGY ELLIPTA) 100-62.5-25 MCG/INH AEPB Inhale 1 puff into the lungs in the morning and at bedtime.   loratadine (CLARITIN) 10 MG tablet Take 1 tablet (10 mg total) by mouth daily.   ondansetron (ZOFRAN) 8 MG tablet Take 1 tablet (8 mg total) by mouth every 8 (eight) hours as needed for nausea or vomiting.   oxyCODONE (OXY IR/ROXICODONE) 5 MG immediate release tablet TAKE 1 TABLET BY MOUTH EVERY 6 HOURS AS NEEDED FOR SEVERE PAIN   pantoprazole (PROTONIX) 40 MG tablet Take 1  tablet (40 mg total) by mouth daily at 6 (six) AM.   prochlorperazine (COMPAZINE) 10 MG tablet Take 1 tablet (10 mg total) by mouth every 6 (six) hours as needed for nausea or vomiting.   Vitamin D, Ergocalciferol, (DRISDOL) 1.25 MG (50000 UT) CAPS capsule Take 1 capsule by mouth once a week   No facility-administered encounter medications on file as of 01/22/2021.    Patient Active Problem List   Diagnosis  Date Noted   Vaccine counseling 01/15/2021   Hypoxia 09/22/2020   Protein-calorie malnutrition, severe 09/03/2018   HCAP (healthcare-associated pneumonia) 08/06/2018   Emphysema of lung ([place]) 08/06/2018   Acute on chronic respiratory failure with hypoxia ([place]) 08/06/2018   Iron deficiency anemia due to chronic blood loss 03/01/2018   Goals of care, counseling/discussion 02/09/2018   Bladder cancer ([place]) 12/13/2017   Hypertensive cardiomyopathy, 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[place]) 04/29/2016   Hypokalemia 04/02/2016   Protein-energy malnutrition ([place]) 04/02/2016   Hypertensive emergency 04/01/2016   Dizziness and giddiness 04/12/2013   Palpitations 03/27/2013   Weight loss 06/08/2012   Degenerative disc disease, lumbar 01/30/2012   Hypercalcemia 01/30/2012   Hyperproteinemia 01/30/2012   Borderline diabetes 01/30/2012   Normocytic anemia 03/11/2011   TOBACCO ABUSE 08/31/2009   Essential hypertension 08/31/2009    Conditions to be addressed/monitored:HTN, COPD, and carcinoma of the bladder  Care Plan : COPD (Adult)  Updates made by Luretha Rued, RN since 01/22/2021 12:00 AM     Problem: long term care plan for self management in a patient with COPD under Chemo treatment for bladder cancer   Priority: High     Long-Range Goal: Symptom Exacerbation Prevented or Minimized   Start Date: 01/22/2021  Expected End Date: 06/24/2021  Priority: High  Note:   .Current Barriers:  Knowledge deficits related to long term care plan for self management of COPD. Recent admission 01/03/21-01/09/21 for acute respiratory failure with hypoxia. Client with a history of COPD, HTN, carcinoma of the bladder. Currently on chemotherapy treatment. Unable to independently  manage household such as cleaning/cooking, manage medication. She states her sister assist with what is needed and fills her pill box   Case Manager Clinical Goal(s): patient will verbalize understanding of COPD action plan and when to seek  appropriate levels of medical care  Interventions:  Collaboration with [name], NP regarding development and update of comprehensive plan of care as evidenced by provider attestation and co-signature Inter-disciplinary care team collaboration (see longitudinal plan of care) Evaluation of current treatment plan related to disease process self management and patient's adherence to plan as established by provider. Provided patient with COPD action plan and reinforced importance of daily self assessment Medications reviewed with patient Discussed importance of using oxygen as recommended.  Contact provider office to report discrepancy of oxygen use-client reports oxygen is on 4 liters per chart her baseline is 2 liters Discussed Covid vaccination-reinforced importance of staying healthy and as chemo may reduce immune system  Discussed smoking cessation-patient states she has not smoked since she got home from the hospital. Declined smoking cessation information Reinforced no smoking with oxygen use  Encouraged client to continue to eat healthy and drink ensure as recommended Encouraged client to continue to attend provider visits as scheduled Encouraged patient to call provider with questions or concerns Discussed personal care service application-patient declines. She states her sister is there to help as needed. Discussed plans with patient for ongoing care management follow up and provided patient with direct contact information for care management  team Referral to care guide-re: community resources for ramp Patient Goals/Self-Care Activities:  - take medications as prescribed - attend provider appointment as scheduled - eat healthy: plenty of protein, lean meats, healthy fats, whole grains, fruits and vegetables - drink your Ensure supplements three times a day as recommended - follow rescue plan if symptoms flare-up and know when to call the doctor - track symptoms and what helps  feel better or worse - call your provider office for new concerns or questions Follow Up Plan: Telephone follow up appointment with care management team member scheduled for: 02/25/21 The patient has been provided with contact information for the care management team and has been advised to call with any health related questions or concerns.     Care Plan : Hypertension (Adult)  Updates made by Luretha Rued, RN since 01/22/2021 12:00 AM     Problem: Disease Progression (Hypertension)   Priority: Medium     Long-Range Goal: Disease Progression Prevented or Minimized   Start Date: 01/22/2021  Expected End Date: 06/24/2021  Priority: Medium  Note:   Objective:  Last practice recorded BP readings:  BP Readings from Last 3 Encounters:  01/15/21 (!) 146/84  01/09/21 (!) 147/114  11/10/20 (!) 153/81  Current Barriers:  Knowledge Deficits related to long term care plan for self management of HTN Unable to independently do household chores such as cleaning/cooking, self-manage medication. She states her sister assist with what is needed and fills her pill box  Case Manager Clinical Goal(s):  patient will attend scheduled medical appointments:  patient will demonstrate improved adherence to prescribed treatment plan for hypertension as evidenced by taking all medications as prescribed, monitoring and recording blood pressure as directed, adhering to low sodium/DASH diet patient will verbalize basic understanding of hypertension disease process and self health management plan  Interventions:  Collaboration with [name], NP regarding development and update of comprehensive plan of care as evidenced by provider attestation and co-signature Inter-disciplinary care team collaboration (see longitudinal plan of care) Provided education to patient re: HTN Reviewed medications with patient and discussed importance of compliance Discussed importance of monitoring blood pressure Reviewed  scheduled/upcoming provider appointments  Discussed plans with patient for ongoing care management follow up and provided patient with direct contact information for care management team Self-Care Activities: - takes medications as prescribed Patient Goals: - monitor blood pressure per provider recommendation - take medications as prescribed - attend provider appointment as scheduled - eat healthy: lean meats/protein, healthy fats, whole grains, fruits and vegetables - check blood pressure and record as recommended by your provider and ask your doctor what is your Target blood pressure range - call your provider office for new concerns or questions - review education on HTN. Plan to discuss at our next telephone call. Follow Up Plan: Telephone follow up appointment with care management team member scheduled for: 02/25/21 The patient has been provided with contact information for the care management team and has been advised to call with any health related questions or concerns.        Plan:Telephone follow up appointment with care management team member scheduled for:  02/25/21 and The patient has been provided with contact information for the care management team and has been advised to call with any health related questions or concerns.   Thea Silversmith, RN, MSN, BSN, CCM Care Management Coordinator [facility] [phone]

## 2021-01-22 NOTE — Telephone Encounter (Signed)
Donna Curry just had a care mgr appt with Ms Panik states she was using her oxygen at  4 litters  but her chart says it should be 2 litters. That was alarming to her and wanted to inform provider just in case adjustments needs to be made

## 2021-01-22 NOTE — Patient Instructions (Addendum)
Visit Information   PATIENT GOALS:   Goals Addressed             This Visit's Progress    Track and Manage My Blood Pressure-Hypertension       Timeframe:  Long-Range Goal Priority:  Medium Start Date:  01/22/21                           Expected End Date:  06/24/21                     Follow Up Date 02/25/21    - monitor blood pressure per provider recommendation - take medications as prescribed - attend provider appointment as scheduled - eat healthy: lean meats/protein, healthy fats, whole grains, fruits and vegetables - check blood pressure and record as recommended by your provider and ask your doctor what is your Target blood pressure range - call your provider office for new concerns or questions - review education on HTN. Plan to discuss at our next telephone call.   Why is this important?   You won't feel high blood pressure, but it can still hurt your blood vessels.  High blood pressure can cause heart or kidney problems. It can also cause a stroke.  Making lifestyle changes like losing a little weight or eating less salt will help.  Checking your blood pressure at home and at different times of the day can help to control blood pressure.  If the doctor prescribes medicine remember to take it the way the doctor ordered.  Call the office if you cannot afford the medicine or if there are questions about it.     Notes:       Track and Manage My Symptoms-COPD       Timeframe:  Long-Range Goal Priority:  High Start Date:  01/22/21                           Expected End Date: 06/24/21                      Follow Up Date 02/25/21    Patient Goals:  - take medications as prescribed - attend provider appointment as scheduled - eat healthy: plenty of protein, lean meats, healthy fats, whole grains, fruits and vegetables - drink your Ensure supplements three times a day as recommended - follow rescue plan if symptoms flare-up and know when to call the doctor - track symptoms  and what helps feel better or worse - call your provider office for new concerns or questions   Why is this important?   Tracking your symptoms and other information about your health helps your doctor plan your care.  Write down the symptoms, the time of day, what you were doing and what medicine you are taking.  You will soon learn how to manage your symptoms.     Notes:          Consent to CCM Services: Donna Curry was given information about Chronic Care Management services today including:  CCM service includes personalized support from designated clinical staff supervised by her physician, including individualized plan of care and coordination with other care providers 24/7 contact phone numbers for assistance for urgent and routine care needs. Service will only be billed when office clinical staff spend 20 minutes or more in a month to coordinate care. Only one practitioner may furnish and  bill the service in a calendar month. The patient may stop CCM services at any time (effective at the end of the month) by phone call to the office staff. The patient will be responsible for cost sharing (co-pay) of up to 20% of the service fee (after annual deductible is met).  Patient agreed to services and verbal consent obtained.   The patient verbalized understanding of instructions, educational materials, and care plan provided today and agreed to receive a mailed copy of patient instructions, educational materials, and care plan.   Telephone follow up appointment with care management team member scheduled for: 02/25/21 The patient has been provided with contact information for the care management team and has been advised to call with any health related questions or concerns.   Thea Silversmith, RN, MSN, BSN, CCM Care Management Coordinator [facility] [phone]   COPD Action Plan A COPD action plan is a description of what to do when you have a flare (exacerbation) of  chronic obstructive pulmonary disease (COPD). Your action plan is a color-coded plan that lists the symptoms that indicate whether your condition is under control and what actions to take. If you have symptoms in the green zone, it means you are doing well that day. If you have symptoms in the yellow zone, it means you are having a bad day or an exacerbation. If you have symptoms in the red zone, you need urgent medical care. Follow the plan that you and your health care provider developed. Review yourplan with your health care provider at each visit. Red zone Symptoms in this zone mean that you should get medical help right away. They include: Feeling very short of breath, even when you are resting. Not being able to do any activities because of poor breathing. Not being able to sleep because of poor breathing. Fever or shaking chills. Feeling confused or very sleepy. Chest pain. Coughing up blood. If you have any of these symptoms, call emergency services (911 in the [place] or go to the nearest emergency room. Yellow zone Symptoms in this zone mean that your condition may be getting worse. They include: Feeling more short of breath than usual. Having less energy for daily activities than usual. Phlegm or mucus that is thicker than usual. Needing to use your rescue inhaler or nebulizer more often than usual. More ankle swelling than usual. Coughing more than usual. Feeling like you have a chest cold. Trouble sleeping due to COPD symptoms. Decreased appetite. COPD medicines not helping as much as usual. If you experience any "yellow" symptoms: Keep taking your daily medicines as directed. Use your quick-relief inhaler as told by your health care provider. If you were prescribed steroid medicine to take by mouth (oral medicine), start taking it as told by your health care provider. If you were prescribed an antibiotic medicine, start taking it as told by your health care provider. Do not  stop taking the antibiotic even if you start to feel better. Use oxygen as told by your health care provider. Get more rest. Do your pursed-lip breathing exercises. Do not smoke. Avoid any irritants in the air. If your signs and symptoms do not improve after taking these steps, call yourhealth care provider right away. Green zone Symptoms in this zone mean that you are doing well. They include: Being able to do your usual activities and exercise. Having the usual amount of coughing, including the same amount of phlegm or mucus. Being able to sleep well. Having a good  appetite. Where to find more information: You can find more information about COPD from: American Lung Association, My COPD Action Plan: www.lung.org COPD Foundation: www.copdfoundation.[place]: https://wilson-eaton.com/ Follow these instructions at home: Continue taking your daily medicines as told by your health care provider. Make sure you receive all the immunizations that your health care provider recommends, especially the pneumococcal and influenza vaccines. Wash your hands often with soap and water. Have family members wash their hands too. Regular hand washing can help prevent infections. Follow your usual exercise and diet plan. Avoid irritants in the air, such as smoke. Do not use any products that contain nicotine or tobacco. These products include cigarettes, chewing tobacco, and vaping devices, such as e-cigarettes. If you need help quitting, ask your health care provider. Summary A COPD action plan tells you what to do when you have a flare (exacerbation) of chronic obstructive pulmonary disease (COPD). Follow each action plan for your symptoms. If you have any symptoms in the red zone, call emergency services (911 in the [place] or go to the nearest emergency room. This information is not intended to replace advice given to you by your health care provider. Make sure you discuss any  questions you have with your healthcare provider. Document Revised: 05/19/2020 Document Reviewed: 05/19/2020 Elsevier Patient Education  2022 [place].  Hypertension, Adult Hypertension is another name for high blood pressure. High blood pressure forces your heart to work harder to pump blood. This can cause problems overtime. There are two numbers in a blood pressure reading. There is a top number (systolic) over a bottom number (diastolic). It is best to have a blood pressure that is below 120/80. Healthy choicescan help lower your blood pressure, or you may need medicine to help lower it. What are the causes? The cause of this condition is not known. Some conditions may be related tohigh blood pressure. What increases the risk? Smoking. Having type 2 diabetes mellitus, high cholesterol, or both. Not getting enough exercise or physical activity. Being overweight. Having too much fat, sugar, calories, or salt (sodium) in your diet. Drinking too much alcohol. Having long-term (chronic) kidney disease. Having a family history of high blood pressure. Age. Risk increases with age. Race. You may be at higher risk if you are African American. Gender. Men are at higher risk than women before age 48. After age 57, women are at higher risk than men. Having obstructive sleep apnea. Stress. What are the signs or symptoms? High blood pressure may not cause symptoms. Very high blood pressure (hypertensive crisis) may cause: Headache. Feelings of worry or nervousness (anxiety). Shortness of breath. Nosebleed. A feeling of being sick to your stomach (nausea). Throwing up (vomiting). Changes in how you see. Very bad chest pain. Seizures. How is this treated? This condition is treated by making healthy lifestyle changes, such as: Eating healthy foods. Exercising more. Drinking less alcohol. Your health care provider may prescribe medicine if lifestyle changes are not enough to get your  blood pressure under control, and if: Your top number is above 130. Your bottom number is above 80. Your personal target blood pressure may vary. Follow these instructions at home: Eating and drinking  If told, follow the DASH eating plan. To follow this plan: Fill one half of your plate at each meal with fruits and vegetables. Fill one fourth of your plate at each meal with whole grains. Whole grains include whole-wheat pasta, brown rice, and whole-grain bread. Eat or drink low-fat  dairy products, such as skim milk or low-fat yogurt. Fill one fourth of your plate at each meal with low-fat (lean) proteins. Low-fat proteins include fish, chicken without skin, eggs, beans, and tofu. Avoid fatty meat, cured and processed meat, or chicken with skin. Avoid pre-made or processed food. Eat less than 1,500 mg of salt each day. Do not drink alcohol if: Your doctor tells you not to drink. You are pregnant, may be pregnant, or are planning to become pregnant. If you drink alcohol: Limit how much you use to: 0-1 drink a day for women. 0-2 drinks a day for men. Be aware of how much alcohol is in your drink. In the [place] one drink equals one 12 oz bottle of beer (355 mL), one 5 oz glass of wine (148 mL), or one 1 oz glass of hard liquor (44 mL).  Lifestyle  Work with your doctor to stay at a healthy weight or to lose weight. Ask your doctor what the best weight is for you. Get at least 30 minutes of exercise most days of the week. This may include walking, swimming, or biking. Get at least 30 minutes of exercise that strengthens your muscles (resistance exercise) at least 3 days a week. This may include lifting weights or doing Pilates. Do not use any products that contain nicotine or tobacco, such as cigarettes, e-cigarettes, and chewing tobacco. If you need help quitting, ask your doctor. Check your blood pressure at home as told by your doctor. Keep all follow-up visits as told by your doctor.  This is important.  Medicines Take over-the-counter and prescription medicines only as told by your doctor. Follow directions carefully. Do not skip doses of blood pressure medicine. The medicine does not work as well if you skip doses. Skipping doses also puts you at risk for problems. Ask your doctor about side effects or reactions to medicines that you should watch for. Contact a doctor if you: Think you are having a reaction to the medicine you are taking. Have headaches that keep coming back (recurring). Feel dizzy. Have swelling in your ankles. Have trouble with your vision. Get help right away if you: Get a very bad headache. Start to feel mixed up (confused). Feel weak or numb. Feel faint. Have very bad pain in your: Chest. Belly (abdomen). Throw up more than once. Have trouble breathing. Summary Hypertension is another name for high blood pressure. High blood pressure forces your heart to work harder to pump blood. For most people, a normal blood pressure is less than 120/80. Making healthy choices can help lower blood pressure. If your blood pressure does not get lower with healthy choices, you may need to take medicine. This information is not intended to replace advice given to you by your health care provider. Make sure you discuss any questions you have with your healthcare provider. Document Revised: 03/21/2018 Document Reviewed: 03/21/2018 Elsevier Patient Education  2022 [place] PLAN: Patient Care Plan: COPD (Adult)     Problem Identified: long term care plan for self management in a patient with COPD under Chemo treatment for bladder cancer   Priority: High     Long-Range Goal: Symptom Exacerbation Prevented or Minimized   Start Date: 01/22/2021  Expected End Date: 06/24/2021  Priority: High  Note:   .Current Barriers:  Knowledge deficits related to long term care plan for self management of COPD. Recent admission 01/03/21-01/09/21 for  acute respiratory failure with hypoxia. Client with a history of COPD, HTN,  carcinoma of the bladder. Currently on chemotherapy treatment. Unable to independently  manage household such as cleaning/cooking, manage medication. She states her sister assist with what is needed and fills her pill box   Case Manager Clinical Goal(s): patient will verbalize understanding of COPD action plan and when to seek appropriate levels of medical care  Interventions:  Collaboration with Sandford Craze, NP regarding development and update of comprehensive plan of care as evidenced by provider attestation and co-signature Inter-disciplinary care team collaboration (see longitudinal plan of care) Evaluation of current treatment plan related to disease process self management and patient's adherence to plan as established by provider. Provided patient with COPD action plan and reinforced importance of daily self assessment Medications reviewed with patient Discussed importance of using oxygen as recommended.  Contact provider office to report discrepancy of oxygen use-client reports oxygen is on 4 liters per chart her baseline is 2 liters Discussed Covid vaccination-reinforced importance of staying healthy and as chemo may reduce immune system  Encouraged client to continue to eat healthy and drink ensure as recommended Encouraged client to continue to attend provider visits as scheduled Encouraged patient to call provider with questions or concerns Discussed personal care service application-patient declines. She states her sister is there to help as needed. Discussed plans with patient for ongoing care management follow up and provided patient with direct contact information for care management team Referral to care guide-re: community resources for ramp Patient Goals/Self-Care Activities:  - take medications as prescribed - attend provider appointment as scheduled - eat healthy: plenty of protein, lean  meats, healthy fats, whole grains, fruits and vegetables - drink your Ensure supplements three times a day as recommended - follow rescue plan if symptoms flare-up and know when to call the doctor - track symptoms and what helps feel better or worse - call your provider office for new concerns or questions Follow Up Plan: Telephone follow up appointment with care management team member scheduled for: 02/25/21 The patient has been provided with contact information for the care management team and has been advised to call with any health related questions or concerns.     Patient Care Plan: Hypertension (Adult)     Problem Identified: Disease Progression (Hypertension)   Priority: Medium     Long-Range Goal: Disease Progression Prevented or Minimized   Start Date: 01/22/2021  Expected End Date: 06/24/2021  Priority: Medium  Note:   Objective:  Last practice recorded BP readings:  BP Readings from Last 3 Encounters:  01/15/21 (!) 146/84  01/09/21 (!) 147/114  11/10/20 (!) 153/81  Current Barriers:  Knowledge Deficits related to long term care plan for self management of HTN Unable to independently do household chores such as cleaning/cooking, self-manage medication. She states her sister assist with what is needed and fills her pill box  Case Manager Clinical Goal(s):  patient will attend scheduled medical appointments:  patient will demonstrate improved adherence to prescribed treatment plan for hypertension as evidenced by taking all medications as prescribed, monitoring and recording blood pressure as directed, adhering to low sodium/DASH diet patient will verbalize basic understanding of hypertension disease process and self health management plan  Interventions:  Collaboration with Sandford Craze, NP regarding development and update of comprehensive plan of care as evidenced by provider attestation and co-signature Inter-disciplinary care team collaboration (see longitudinal  plan of care) Provided education to patient re: HTN Reviewed medications with patient and discussed importance of compliance Discussed importance of monitoring blood pressure Reviewed scheduled/upcoming provider appointments  Discussed plans with patient for ongoing care management follow up and provided patient with direct contact information for care management team Self-Care Activities: - takes medications as prescribed Patient Goals: - monitor blood pressure per provider recommendation - take medications as prescribed - attend provider appointment as scheduled - eat healthy: lean meats/protein, healthy fats, whole grains, fruits and vegetables - check blood pressure and record as recommended by your provider and ask your doctor what is your Target blood pressure range - call your provider office for new concerns or questions - review education on HTN. Plan to discuss at our next telephone call. Follow Up Plan: Telephone follow up appointment with care management team member scheduled for: 02/25/21 The patient has been provided with contact information for the care management team and has been advised to call with any health related questions or concerns.

## 2021-01-25 DIAGNOSIS — J449 Chronic obstructive pulmonary disease, unspecified: Secondary | ICD-10-CM | POA: Diagnosis not present

## 2021-01-25 NOTE — Telephone Encounter (Signed)
Please ask pt to check her oxygen saturation at 2 L and let us know what her reading is.  She was OK on room air when I saw her so 2 liters is likely enough.

## 2021-01-26 NOTE — Telephone Encounter (Signed)
Patient reports her level at 2L was 98%

## 2021-01-26 NOTE — Telephone Encounter (Signed)
Patient advised to heck her oxygen saturation level on 2L. I will call her back later today to get results.

## 2021-01-27 ENCOUNTER — Telehealth: Payer: Self-pay | Admitting: *Deleted

## 2021-01-27 NOTE — Telephone Encounter (Signed)
Good, she should continue oxygen at 2 L please.

## 2021-01-27 NOTE — Telephone Encounter (Signed)
   Telephone encounter was:  Successful.  01/27/2021 Name: Donna Curry MRN: [id number] DOB: Apr 22, 1956  Donna Curry is a 65 y.o. year old female who is a primary care patient of [name], NP . The community resource team was consulted for assistance with Sending patient information on ram p on 2 non profit agency in the area that provide ramp building and also dept of voc rehab  Care guide performed the following interventions: Patient provided with information about care guide support team and interviewed to confirm resource needs.  Follow Up Plan:  Care guide will follow up with patient by phone over the next day    [place], Care Management  [phone] [address] , [place] [address] Email : [email or web address]

## 2021-01-27 NOTE — Telephone Encounter (Signed)
Patient was advised to continue at 2L

## 2021-02-04 ENCOUNTER — Telehealth: Payer: Self-pay | Admitting: *Deleted

## 2021-02-04 NOTE — Telephone Encounter (Signed)
   Telephone encounter was:  Successful.  02/04/2021 Name: Donna Curry MRN: [id number] DOB: [birth year]  Donna Curry is a 65 y.o. year old female who is a primary care patient of [name], NP . The community resource team was consulted for assistance with ramp  Care guide performed the following interventions: Patient provided with information about care guide support team and interviewed to confirm resource needs Follow up call placed to community resources to determine status of patients referral.  Follow Up Plan:  No further follow up planned at this time. The patient has been provided with needed resources.  [place], Care Management  [phone] [address] , [place] [address] Email : [email or web address]. [email or web address]

## 2021-02-13 DIAGNOSIS — R64 Cachexia: Secondary | ICD-10-CM | POA: Diagnosis not present

## 2021-02-13 DIAGNOSIS — J9601 Acute respiratory failure with hypoxia: Secondary | ICD-10-CM | POA: Diagnosis not present

## 2021-02-13 DIAGNOSIS — J449 Chronic obstructive pulmonary disease, unspecified: Secondary | ICD-10-CM | POA: Diagnosis not present

## 2021-02-13 DIAGNOSIS — Z9981 Dependence on supplemental oxygen: Secondary | ICD-10-CM | POA: Diagnosis not present

## 2021-02-15 ENCOUNTER — Encounter: Payer: Self-pay | Admitting: Hematology & Oncology

## 2021-02-15 ENCOUNTER — Inpatient Hospital Stay: Payer: Medicare Other

## 2021-02-15 ENCOUNTER — Inpatient Hospital Stay: Payer: Medicare Other | Attending: Hematology & Oncology

## 2021-02-15 ENCOUNTER — Other Ambulatory Visit: Payer: Self-pay | Admitting: *Deleted

## 2021-02-15 ENCOUNTER — Telehealth: Payer: Self-pay

## 2021-02-15 ENCOUNTER — Telehealth: Payer: Self-pay | Admitting: Family

## 2021-02-15 ENCOUNTER — Other Ambulatory Visit: Payer: Self-pay | Admitting: Family

## 2021-02-15 ENCOUNTER — Ambulatory Visit (HOSPITAL_BASED_OUTPATIENT_CLINIC_OR_DEPARTMENT_OTHER): Admission: RE | Admit: 2021-02-15 | Payer: Medicare Other | Source: Ambulatory Visit

## 2021-02-15 ENCOUNTER — Inpatient Hospital Stay (HOSPITAL_BASED_OUTPATIENT_CLINIC_OR_DEPARTMENT_OTHER): Payer: Medicare Other | Admitting: Hematology & Oncology

## 2021-02-15 ENCOUNTER — Other Ambulatory Visit: Payer: Self-pay

## 2021-02-15 VITALS — BP 144/81 | HR 97 | Temp 98.6°F | Resp 19 | Ht 62.0 in | Wt 75.0 lb

## 2021-02-15 VITALS — BP 146/75 | HR 89 | Resp 17

## 2021-02-15 DIAGNOSIS — C671 Malignant neoplasm of dome of bladder: Secondary | ICD-10-CM

## 2021-02-15 DIAGNOSIS — Z79899 Other long term (current) drug therapy: Secondary | ICD-10-CM | POA: Insufficient documentation

## 2021-02-15 DIAGNOSIS — D500 Iron deficiency anemia secondary to blood loss (chronic): Secondary | ICD-10-CM | POA: Diagnosis not present

## 2021-02-15 DIAGNOSIS — R4589 Other symptoms and signs involving emotional state: Secondary | ICD-10-CM

## 2021-02-15 DIAGNOSIS — C679 Malignant neoplasm of bladder, unspecified: Secondary | ICD-10-CM | POA: Diagnosis not present

## 2021-02-15 DIAGNOSIS — F418 Other specified anxiety disorders: Secondary | ICD-10-CM

## 2021-02-15 DIAGNOSIS — C670 Malignant neoplasm of trigone of bladder: Secondary | ICD-10-CM

## 2021-02-15 DIAGNOSIS — Z5112 Encounter for antineoplastic immunotherapy: Secondary | ICD-10-CM | POA: Insufficient documentation

## 2021-02-15 DIAGNOSIS — C672 Malignant neoplasm of lateral wall of bladder: Secondary | ICD-10-CM

## 2021-02-15 LAB — CBC WITH DIFFERENTIAL (CANCER CENTER ONLY)
Abs Immature Granulocytes: 0.02 10*3/uL (ref 0.00–0.07)
Basophils Absolute: 0 10*3/uL (ref 0.0–0.1)
Basophils Relative: 0 %
Eosinophils Absolute: 0.1 10*3/uL (ref 0.0–0.5)
Eosinophils Relative: 1 %
HCT: 32.6 % — ABNORMAL LOW (ref 36.0–46.0)
Hemoglobin: 10.4 g/dL — ABNORMAL LOW (ref 12.0–15.0)
Immature Granulocytes: 0 %
Lymphocytes Relative: 16 %
Lymphs Abs: 0.9 10*3/uL (ref 0.7–4.0)
MCH: 29.5 pg (ref 26.0–34.0)
MCHC: 31.9 g/dL (ref 30.0–36.0)
MCV: 92.6 fL (ref 80.0–100.0)
Monocytes Absolute: 0.5 10*3/uL (ref 0.1–1.0)
Monocytes Relative: 9 %
Neutro Abs: 4 10*3/uL (ref 1.7–7.7)
Neutrophils Relative %: 74 %
Platelet Count: 237 10*3/uL (ref 150–400)
RBC: 3.52 MIL/uL — ABNORMAL LOW (ref 3.87–5.11)
RDW: 18.2 % — ABNORMAL HIGH (ref 11.5–15.5)
WBC Count: 5.5 10*3/uL (ref 4.0–10.5)
nRBC: 0 % (ref 0.0–0.2)

## 2021-02-15 LAB — CMP (CANCER CENTER ONLY)
ALT: 14 U/L (ref 0–44)
AST: 14 U/L — ABNORMAL LOW (ref 15–41)
Albumin: 4.2 g/dL (ref 3.5–5.0)
Alkaline Phosphatase: 87 U/L (ref 38–126)
Anion gap: 6 (ref 5–15)
BUN: 9 mg/dL (ref 8–23)
CO2: 35 mmol/L — ABNORMAL HIGH (ref 22–32)
Calcium: 10 mg/dL (ref 8.9–10.3)
Chloride: 101 mmol/L (ref 98–111)
Creatinine: 0.74 mg/dL (ref 0.44–1.00)
GFR, Estimated: >60 mL/min (ref >60)
Glucose, Bld: 101 mg/dL — ABNORMAL HIGH (ref 70–99)
Potassium: 4.2 mmol/L (ref 3.5–5.1)
Sodium: 142 mmol/L (ref 135–145)
Total Bilirubin: 0.3 mg/dL (ref 0.3–1.2)
Total Protein: 7.1 g/dL (ref 6.5–8.1)

## 2021-02-15 LAB — LACTATE DEHYDROGENASE: LDH: 216 U/L — ABNORMAL HIGH (ref 98–192)

## 2021-02-15 MED ORDER — SODIUM CHLORIDE 0.9 % IV SOLN
400.0000 mg | Freq: Once | INTRAVENOUS | Status: AC
  Administered 2021-02-15: 400 mg via INTRAVENOUS
  Filled 2021-02-15: qty 16

## 2021-02-15 MED ORDER — DIAZEPAM 5 MG PO TABS: 5.0000 mg | ORAL_TABLET | Freq: Three times a day (TID) | ORAL | 0 refills | Status: DC | PRN

## 2021-02-15 MED ORDER — HEPARIN SOD (PORK) LOCK FLUSH 100 UNIT/ML IV SOLN
500.0000 [IU] | Freq: Once | INTRAVENOUS | Status: AC | PRN
  Administered 2021-02-15: 500 [IU]
  Filled 2021-02-15: qty 5

## 2021-02-15 MED ORDER — OXYCODONE HCL 5 MG PO TABS: ORAL_TABLET | Freq: Four times a day (QID) | ORAL | 0 refills | Status: DC | PRN

## 2021-02-15 MED ORDER — SODIUM CHLORIDE 0.9% FLUSH
10.0000 mL | INTRAVENOUS | Status: DC | PRN
  Administered 2021-02-15: 10 mL
  Filled 2021-02-15: qty 10

## 2021-02-15 MED ORDER — SODIUM CHLORIDE 0.9 % IV SOLN
510.0000 mg | Freq: Once | INTRAVENOUS | Status: AC
  Administered 2021-02-15: 510 mg via INTRAVENOUS
  Filled 2021-02-15: qty 510

## 2021-02-15 MED ORDER — SODIUM CHLORIDE 0.9 % IV SOLN
Freq: Once | INTRAVENOUS | Status: AC
  Filled 2021-02-15: qty 250

## 2021-02-15 NOTE — Progress Notes (Signed)
Hematology and Oncology Follow Up Visit  Donna Curry [id number] April 19, 1956 65 y.o. 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  She says she was hospitalized about a month ago.  She had another COPD exacerbation.  She has not smoked since then.  She is staying inside with a hot humid weather.  This helps her lungs more than anything else..  She has had no problems with bowels or bladder.  She has had no cough.    Her last iron studies were done back in April.  Her ferritin was 14 with an iron saturation of 7%.  We will have to see what her iron studies are now.  I would think they may be lower because her hemoglobin is lower.  [place]  She hears gunshots outside.  Again she stays inside.  Overall, I would have to say her performance status is probably ECOG 2.    Medications:  Allergies as of 02/15/2021   No Known Allergies      Medication List        Accurate as of February 15, 2021 10:27 AM. If you have any questions, ask your nurse or doctor.          acetaminophen 500 MG tablet Commonly known as: TYLENOL Take 1,000 mg by mouth every 8 (eight) hours as needed for moderate pain.   albuterol (2.5 MG/3ML) 0.083% nebulizer solution Commonly known as: PROVENTIL Take 3 mLs (2.5 mg total) by nebulization every 6 (six) hours as needed for wheezing or shortness of breath. Use 3 times daily x 4 days then every 6 hours as needed.   amitriptyline 10 MG tablet Commonly known as: ELAVIL Take 1 tablet (10 mg total) by mouth at bedtime  as needed for sleep.   amLODipine 10 MG tablet Commonly known as: NORVASC Take 1 tablet (10 mg total) by mouth daily.   aspirin EC 81 MG tablet Take 81 mg by mouth daily.   benzonatate 100 MG capsule Commonly known as: TESSALON Take 1 capsule (100 mg total) by mouth 2 (two) times daily as needed for cough.   citalopram 10 MG tablet Commonly known as: CeleXA Take 1 tablet (10 mg total) by mouth daily.   diazepam 5 MG tablet Commonly known as: VALIUM Take 1 tablet (5 mg total) by mouth every 8 (eight) hours as needed for anxiety.   feeding supplement Liqd Take 237 mLs by mouth 2 (two) times daily between meals.   loratadine 10 MG tablet Commonly known as: CLARITIN Take 1 tablet (10 mg total) by mouth daily.   ondansetron 8 MG tablet Commonly known as: ZOFRAN Take 1 tablet (8 mg total) by mouth every 8 (eight) hours as needed for nausea or vomiting.   oxyCODONE 5 MG immediate release tablet Commonly known as: Oxy IR/ROXICODONE TAKE 1 TABLET BY MOUTH EVERY 6 HOURS AS NEEDED FOR SEVERE PAIN   pantoprazole 40 MG tablet Commonly known as: PROTONIX Take 1 tablet (40 mg total) by mouth daily at 6 (six) AM.  prochlorperazine 10 MG tablet Commonly known as: COMPAZINE Take 1 tablet (10 mg total) by mouth every 6 (six) hours as needed for nausea or vomiting.   Trelegy Ellipta 100-62.5-25 MCG/INH Aepb Generic drug: Fluticasone-Umeclidin-Vilant Inhale 1 puff into the lungs in the morning and at bedtime.   Vitamin D (Ergocalciferol) 1.25 MG (50000 UNIT) Caps capsule Commonly known as: DRISDOL Take 1 capsule by mouth once a week        Allergies: No Known Allergies  Past Medical History, Surgical history, Social history, and Family History were reviewed and updated.  Review of Systems: Review of Systems  Constitutional: Negative.   HENT: Negative.    Eyes: Negative.   Respiratory: Negative.    Cardiovascular: Negative.   Gastrointestinal:  Positive for abdominal  pain.  Genitourinary:  Positive for frequency.  Musculoskeletal:  Positive for joint pain and myalgias.  Skin: Negative.   Neurological: Negative.   Endo/Heme/Allergies: Negative.   Psychiatric/Behavioral: Negative.      Physical Exam:  height is 5\' 2"  (1.575 m) and weight is 75 lb (34 kg). Her oral temperature is 98.6 F (37 C). Her blood pressure is 144/81 (abnormal) and her pulse is 97. Her respiration is 19 and oxygen saturation is 95%.   Wt Readings from Last 3 Encounters:  02/15/21 75 lb (34 kg)  01/15/21 72 lb 12.8 oz (33 kg)  01/04/21 65 lb 14.4 oz (29.9 kg)    Physical Exam Vitals reviewed.  HENT:     Head: Normocephalic and atraumatic.  Eyes:     Pupils: Pupils are equal, round, and reactive to light.  Cardiovascular:     Rate and Rhythm: Normal rate and regular rhythm.     Heart sounds: Normal heart sounds.  Pulmonary:     Effort: Pulmonary effort is normal.     Breath sounds: Normal breath sounds.  Abdominal:     General: Bowel sounds are normal.     Palpations: Abdomen is soft.  Musculoskeletal:        General: No tenderness or deformity. Normal range of motion.     Cervical back: Normal range of motion.  Lymphadenopathy:     Cervical: No cervical adenopathy.  Skin:    General: Skin is warm and dry.     Findings: No erythema or rash.  Neurological:     Mental Status: She is alert and oriented to person, place, and time.  Psychiatric:        Behavior: Behavior normal.        Thought Content: Thought content normal.        Judgment: Judgment normal.     Lab Results  Component Value Date   WBC 5.5 02/15/2021   HGB 10.4 (L) 02/15/2021   HCT 32.6 (L) 02/15/2021   MCV 92.6 02/15/2021   PLT 237 02/15/2021   Lab Results  Component Value Date   FERRITIN 14 11/10/2020   IRON 25 (L) 11/10/2020   TIBC 383 11/10/2020   UIBC 358 11/10/2020   IRONPCTSAT 7 (L) 11/10/2020   Lab Results  Component Value Date   RBC 3.52 (L) 02/15/2021   No results  found for: KPAFRELGTCHN, LAMBDASER, KAPLAMBRATIO No results found for: IGGSERUM, IGA, IGMSERUM No results found for: Ronnald Ramp, A1GS, A2GS, Violet Baldy, MSPIKE, SPEI   Chemistry      Component Value Date/Time   NA 142 02/15/2021 0903   K 4.2 02/15/2021 0903   CL 101 02/15/2021 0903   CO2 35 (H) 02/15/2021 0263  BUN 9 02/15/2021 0903   CREATININE 0.74 02/15/2021 0903   CREATININE 1.10 08/20/2013 1027      Component Value Date/Time   CALCIUM 10.0 02/15/2021 0903   ALKPHOS 87 02/15/2021 0903   AST 14 (L) 02/15/2021 0903   ALT 14 02/15/2021 0903   BILITOT 0.3 02/15/2021 0903       Impression and Plan: Ms. Mazurek is a very pleasant 65 yo African American female with 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   We will go ahead with her immunotherapy today.  I would just glad that she has stopped smoking.  I have voiced that her underlying COPD would be the real problem that she would have long-term.  I just do not see need for any scans right now.  Want try to make things diseases that we can for Ms. Garceau.  It is hard for her to make appointments and to have scans done because of her underlying COPD.  We will plan to get her back after Labor Day now for her next cycle of pembrolizumab.   [name], MD 7/25/202210:27 AM

## 2021-02-15 NOTE — Telephone Encounter (Signed)
Appts made per 02/15/21 los, pt to gain sch at [facility] and a vm was left on her mobile number  Donna Curry

## 2021-02-15 NOTE — Telephone Encounter (Signed)
Pt came in office stating needing refills on albuterol (PROVENTIL) (2.5 MG/3ML) 0.083% nebulizer solution, amLODipine (NORVASC) 10 MG tablet , amitriptyline (ELAVIL) 10 MG tablet, benzonatate (TESSALON) 100 MG capsule, citalopram (CELEXA) 10 MG tablet , Vitamin D, Ergocalciferol, (DRISDOL) 1.25 MG (50000 UT) CAPS capsule, pantoprazole (PROTONIX) 40 MG tablet and Fluticasone-Umeclidin-Vilant (TRELEGY ELLIPTA) sent to: [address], [place] [address]  Phone:  [phone]  Fax:  [phone]. Please advise.

## 2021-02-15 NOTE — Patient Instructions (Signed)

## 2021-02-15 NOTE — Patient Instructions (Signed)
[facility] Discharge Instructions for Patients Receiving Chemotherapy  Today you received the following chemotherapy agents :  Keytruda.  To help prevent nausea and vomiting after your treatment, we encourage you to take your nausea medication as prescribed.   If you develop nausea and vomiting that is not controlled by your nausea medication, call the clinic.   BELOW ARE SYMPTOMS THAT SHOULD BE REPORTED IMMEDIATELY:  *FEVER GREATER THAN 100.5 F  *CHILLS WITH OR WITHOUT FEVER  NAUSEA AND VOMITING THAT IS NOT CONTROLLED WITH YOUR NAUSEA MEDICATION  *UNUSUAL SHORTNESS OF BREATH  *UNUSUAL BRUISING OR BLEEDING  TENDERNESS IN MOUTH AND THROAT WITH OR WITHOUT PRESENCE OF ULCERS  *URINARY PROBLEMS  *BOWEL PROBLEMS  UNUSUAL RASH Items with * indicate a potential emergency and should be followed up as soon as possible.  Feel free to call the clinic should you have any questions or concerns. The clinic phone number is [phone].  Please show the CHEMO ALERT CARD at check-in to the Emergency Department and triage nurse.  

## 2021-02-16 ENCOUNTER — Other Ambulatory Visit: Payer: Self-pay

## 2021-02-16 DIAGNOSIS — I10 Essential (primary) hypertension: Secondary | ICD-10-CM

## 2021-02-16 DIAGNOSIS — C672 Malignant neoplasm of lateral wall of bladder: Secondary | ICD-10-CM

## 2021-02-17 ENCOUNTER — Other Ambulatory Visit: Payer: Self-pay

## 2021-02-17 DIAGNOSIS — I10 Essential (primary) hypertension: Secondary | ICD-10-CM

## 2021-02-17 DIAGNOSIS — C672 Malignant neoplasm of lateral wall of bladder: Secondary | ICD-10-CM

## 2021-02-17 MED ORDER — CITALOPRAM HYDROBROMIDE 10 MG PO TABS: 10.0000 mg | ORAL_TABLET | Freq: Every day | ORAL | 0 refills | Status: DC

## 2021-02-17 MED ORDER — AMLODIPINE BESYLATE 10 MG PO TABS: 10.0000 mg | ORAL_TABLET | Freq: Every day | ORAL | 0 refills | Status: DC

## 2021-02-17 MED ORDER — VITAMIN D (ERGOCALCIFEROL) 1.25 MG (50000 UNIT) PO CAPS: 50000.0000 [IU] | ORAL_CAPSULE | ORAL | 0 refills | Status: DC

## 2021-02-17 MED ORDER — AMITRIPTYLINE HCL 10 MG PO TABS: 10.0000 mg | ORAL_TABLET | Freq: Every evening | ORAL | 0 refills | Status: DC | PRN

## 2021-02-17 MED ORDER — TRELEGY ELLIPTA 100-62.5-25 MCG/INH IN AEPB: 1.0000 | INHALATION_SPRAY | Freq: Two times a day (BID) | RESPIRATORY_TRACT | 0 refills | Status: DC

## 2021-02-17 MED ORDER — ALBUTEROL SULFATE (2.5 MG/3ML) 0.083% IN NEBU: 2.5000 mg | INHALATION_SOLUTION | Freq: Four times a day (QID) | RESPIRATORY_TRACT | 0 refills | Status: AC | PRN

## 2021-02-17 MED ORDER — BENZONATATE 100 MG PO CAPS: 100.0000 mg | ORAL_CAPSULE | Freq: Two times a day (BID) | ORAL | 0 refills | Status: DC | PRN

## 2021-02-17 MED ORDER — PANTOPRAZOLE SODIUM 40 MG PO TBEC: 40.0000 mg | DELAYED_RELEASE_TABLET | Freq: Every day | ORAL | 0 refills | Status: AC

## 2021-02-17 NOTE — Telephone Encounter (Signed)
Refills sent for 30 days.

## 2021-02-25 ENCOUNTER — Telehealth: Payer: Medicare Other

## 2021-02-25 ENCOUNTER — Telehealth: Payer: Self-pay

## 2021-02-25 DIAGNOSIS — J449 Chronic obstructive pulmonary disease, unspecified: Secondary | ICD-10-CM | POA: Diagnosis not present

## 2021-02-25 NOTE — Telephone Encounter (Signed)
  Care Management   Follow Up Note   02/25/2021 Name: Donna Curry MRN: [id number] DOB: Mar 27, 1956   Referred by: [name], NP Reason for referral : Chronic Care Management (RNCM follow up)   An unsuccessful telephone outreach was attempted today. The patient was referred to the case management team for assistance with care management and care coordination.   Follow Up Plan: The care management team will reach out to the patient again over the next 30 days.   Thea Silversmith, RN, MSN, BSN, CCM Care Management Coordinator [facility] [phone]

## 2021-02-26 ENCOUNTER — Telehealth: Payer: Self-pay | Admitting: *Deleted

## 2021-02-26 NOTE — Chronic Care Management (AMB) (Signed)
  Care Management   Note  02/26/2021 Name: Donna Curry MRN: [id number] DOB: 11-Nov-1955  Donna Curry is a 65 y.o. year old female who is a primary care patient of [name], NP and is actively engaged with the care management team. I reached out to Rayburn Go by phone today to assist with re-scheduling a follow up visit with the RN Case Manager  Follow up plan: Unsuccessful telephone outreach attempt made. A HIPAA compliant phone message was left for the patient providing contact information and requesting a return call.   Julian Hy, [place] Management  Direct Dial: [phone]

## 2021-03-01 NOTE — Chronic Care Management (AMB) (Signed)
  Care Management   Note  03/01/2021 Name: Donna Curry MRN: [id number] DOB: 10-07-1955  Donna Curry is a 65 y.o. year old female who is a primary care patient of [name], NP and is actively engaged with the care management team. I reached out to Rayburn Go by phone today to assist with re-scheduling a follow up visit with the RN Case Manager  Follow up plan: 2nd attempt Unsuccessful telephone outreach attempt made. A HIPAA compliant phone message was left for the patient providing contact information and requesting a return call.   Julian Hy, [place] Management  Direct Dial: [phone]

## 2021-03-04 NOTE — Chronic Care Management (AMB) (Signed)
  Care Management   Note  03/04/2021 Name: Donna Curry MRN: [id number] DOB: January 16, 1956  Donna Curry is a 65 y.o. year old female who is a primary care patient of [name], NP and is actively engaged with the care management team. I reached out to Rayburn Go by phone today to assist with re-scheduling a follow up visit with the RN Case Manager  Follow up plan: Telephone appointment with care management team member scheduled for: 03/09/2021  Julian Hy, [place], [place] Management  Direct Dial: [phone]

## 2021-03-09 ENCOUNTER — Ambulatory Visit (INDEPENDENT_AMBULATORY_CARE_PROVIDER_SITE_OTHER): Payer: Medicare Other

## 2021-03-09 DIAGNOSIS — J449 Chronic obstructive pulmonary disease, unspecified: Secondary | ICD-10-CM | POA: Diagnosis not present

## 2021-03-09 DIAGNOSIS — I10 Essential (primary) hypertension: Secondary | ICD-10-CM

## 2021-03-09 NOTE — Chronic Care Management (AMB) (Signed)
Chronic Care Management   CCM RN Visit Note  03/09/2021 Name: Donna Curry MRN: [id number] DOB: [birth year]  Subjective: Donna Curry is a 65 y.o. year old female who is a primary care patient of [name], NP. The care management team was consulted for assistance with disease management and care coordination needs.    Engaged with patient by telephone for follow up visit in response to provider referral for case management and/or care coordination services.   Consent to Services:  The patient was given information about Chronic Care Management services, agreed to services, and gave verbal consent prior to initiation of services.  Please see initial visit note for detailed documentation.   Patient agreed to services and verbal consent obtained.   Assessment: Review of patient past medical history, allergies, medications, health status, including review of consultants reports, laboratory and other test data, was performed as part of comprehensive evaluation and provision of chronic care management services.   SDOH (Social Determinants of Health) assessments and interventions performed:    CCM Care Plan  No Known Allergies  Outpatient Encounter Medications as of 03/09/2021  Medication Sig   acetaminophen (TYLENOL) 500 MG tablet Take 1,000 mg by mouth every 8 (eight) hours as needed for moderate pain.   albuterol (PROVENTIL) (2.5 MG/3ML) 0.083% nebulizer solution Take 3 mLs (2.5 mg total) by nebulization every 6 (six) hours as needed for wheezing or shortness of breath. Use 3 times daily x 4 days then every 6 hours as needed.   amitriptyline (ELAVIL) 10 MG tablet Take 1 tablet (10 mg total) by mouth at bedtime as needed for sleep.   amLODipine (NORVASC) 10 MG tablet Take 1 tablet (10 mg total) by mouth daily.   aspirin EC 81 MG tablet Take 81 mg by mouth daily.   benzonatate (TESSALON) 100 MG capsule Take 1 capsule (100 mg total) by mouth 2 (two) times daily as needed for  cough.   citalopram (CELEXA) 10 MG tablet Take 1 tablet (10 mg total) by mouth daily.   diazepam (VALIUM) 5 MG tablet Take 1 tablet (5 mg total) by mouth every 8 (eight) hours as needed for anxiety.   feeding supplement (ENSURE ENLIVE / ENSURE PLUS) LIQD Take 237 mLs by mouth 2 (two) times daily between meals.   Fluticasone-Umeclidin-Vilant (TRELEGY ELLIPTA) 100-62.5-25 MCG/INH AEPB Inhale 1 puff into the lungs in the morning and at bedtime.   loratadine (CLARITIN) 10 MG tablet Take 1 tablet (10 mg total) by mouth daily.   ondansetron (ZOFRAN) 8 MG tablet Take 1 tablet (8 mg total) by mouth every 8 (eight) hours as needed for nausea or vomiting.   oxyCODONE (OXY IR/ROXICODONE) 5 MG immediate release tablet TAKE 1 TABLET BY MOUTH EVERY 6 HOURS AS NEEDED FOR SEVERE PAIN   pantoprazole (PROTONIX) 40 MG tablet Take 1 tablet (40 mg total) by mouth daily at 6 (six) AM.   prochlorperazine (COMPAZINE) 10 MG tablet Take 1 tablet (10 mg total) by mouth every 6 (six) hours as needed for nausea or vomiting.   Vitamin D, Ergocalciferol, (DRISDOL) 1.25 MG (50000 UNIT) CAPS capsule Take 1 capsule (50,000 Units total) by mouth once a week.   No facility-administered encounter medications on file as of 03/09/2021.    Patient Active Problem List   Diagnosis Date Noted   Vaccine counseling 01/15/2021   Hypoxia 09/22/2020   Protein-calorie malnutrition, severe 09/03/2018   HCAP (healthcare-associated pneumonia) 08/06/2018   Emphysema of lung ([place]) 08/06/2018   Acute on chronic  respiratory failure with hypoxia ([place]) 08/06/2018   Iron deficiency anemia due to chronic blood loss 03/01/2018   Goals of care, counseling/discussion 02/09/2018   Bladder cancer ([place]) 12/13/2017   Hypertensive cardiomyopathy, 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[place]) 04/29/2016   Hypokalemia 04/02/2016   Protein-energy malnutrition ([place]) 04/02/2016   Hypertensive emergency 04/01/2016   Dizziness and giddiness 04/12/2013   Palpitations 03/27/2013    Weight loss 06/08/2012   Degenerative disc disease, lumbar 01/30/2012   Hypercalcemia 01/30/2012   Hyperproteinemia 01/30/2012   Borderline diabetes 01/30/2012   Normocytic anemia 03/11/2011   TOBACCO ABUSE 08/31/2009   Essential hypertension 08/31/2009    Conditions to be addressed/monitored:HTN and COPD  Care Plan : COPD (Adult)  Updates made by Luretha Rued, RN since 03/09/2021 12:00 AM     Problem: long term care plan for self management in a patient with COPD under Chemo treatment for bladder cancer   Priority: High     Long-Range Goal: Symptom Exacerbation Prevented or Minimized   Start Date: 01/22/2021  Expected End Date: 06/24/2021  This Visit's Progress: On track  Priority: High  Note:   .Current Barriers:  Knowledge deficits related to long term care plan for self management of COPD. Recent admission 01/03/21-01/09/21 for acute respiratory failure with hypoxia. Client with a history of COPD, HTN, carcinoma of the bladder. Currently on chemotherapy treatment. Client reports she is doing well. She denies any signs/symptoms of exacerbation. However she does state some DOE when up and walking a lot. She states she remains active and walks in the house. She reports she is using oxygen at 2L/[place] as needed and checks her Oxygen saturation daily. She reports sat 99% today. She reports she has started smoking again, but the last cigarette was 2 weeks ago. She is requesting smoking cessation information. Unable to independently  manage household such as cleaning/cooking, manage medication. She states her sister assist with what is needed and fills her pill box   Case Manager Clinical Goal(s): patient will verbalize understanding of COPD action plan and when to seek appropriate levels of medical care  Interventions:  Collaboration with [name], NP regarding development and update of comprehensive plan of care as evidenced by provider attestation and  co-signature Inter-disciplinary care team collaboration (see longitudinal plan of care) Evaluation of current treatment plan related to disease process self management and patient's adherence to plan as established by provider. Reviewed COPD action yellow zone signs/symptoms and when to call the doctor. Medications reviewed with patient and encouraged to continue to take as recommended Reviewed oxygen usage Discussed importance of smoking cessation and send smoking cessation information.  Reinforced no smoking with oxygen use  Encouraged client to continue to eat healthy and drink ensure as recommended Encouraged client to continue to attend provider visits as scheduled Encouraged patient to call provider with questions or concerns Discussed plans with patient for ongoing care management follow up and provided patient with direct contact information for care management team Patient Goals/Self-Care Activities:  - continue to take medications as prescribed - Smoking Quit line [phone] - continue to attend provider appointment as scheduled - continue to eat healthy: plenty of protein, lean meats, healthy fats, whole grains, fruits and vegetables. Monitor salt intake. - continue to drink your Ensure supplements three times a day as recommended - continue to follow rescue plan if symptoms flare-up and know when to call the doctor - continue to track symptoms and what helps feel better or worse - call your provider office for new  concerns or questions - review information on smoking cessation provided by mail. Plan to discuss at next telephone call. Important safety factors to keep in mind if you are using oxygen: Never smoke or light flames around the oxygen device. Do not let others smoke around you. Never use the oxygen device near a gas heater or stove. Avoid using lotions and creams with petroleum in them. Be sure to store the oxygen tanks upright. Make sure they cannot fall  over. Turn off the oxygen supply when not in use. Have working smoke detectors in the home. Follow Up Plan: Telephone follow up appointment with care management team member scheduled for: 04/08/21 The patient has been provided with contact information for the care management team and has been advised to call with any health related questions or concerns.     Care Plan : Hypertension (Adult)  Updates made by Luretha Rued, RN since 03/09/2021 12:00 AM     Problem: Disease Progression (Hypertension)   Priority: Medium     Long-Range Goal: Disease Progression Prevented or Minimized   Start Date: 01/22/2021  Expected End Date: 06/24/2021  This Visit's Progress: On track  Priority: Medium  Note:   Objective:  Last practice recorded BP readings:  BP Readings from Last 3 Encounters:  02/15/21 (!) 146/75  02/15/21 (!) 144/81  01/15/21 (!) 146/84  Current Barriers:  Knowledge Deficits related to long term care plan for self management of HTN. Patient reports she is doing well. She reports her sisters check her blood pressure about every other day. She states they have been within a normal range. However, she does not have the readings near her at the time of this call. Unable to independently do household chores such as cleaning/cooking, self-manage medication. She states her sister assist with what is needed and fills her pill box  Case Manager Clinical Goal(s):  patient will attend scheduled medical appointments:  patient will demonstrate improved adherence to prescribed treatment plan for hypertension as evidenced by taking all medications as prescribed, monitoring and recording blood pressure as directed, adhering to low sodium/DASH diet patient will verbalize basic understanding of hypertension disease process and self health management plan  Interventions:  Collaboration with [name], NP regarding development and update of comprehensive plan of care as evidenced by provider  attestation and co-signature Inter-disciplinary care team collaboration (see longitudinal plan of care) Discussed the importance of smoking cessation Provided education to patient re: smoking cessation Reviewed medications with patient and medication management. States she uses a pill box system. Reiterated the importance of monitoring blood pressure Reviewed scheduled/upcoming provider appointments  Discussed plans with patient for ongoing care management follow up and provided patient with direct contact information for care management team Self-Care Activities: - takes medications as prescribed Patient Goals: - continue to monitor blood pressure per provider recommendation - continue to take medications as prescribed - continue to attend provider appointment as scheduled - continue to eat healthy: lean meats/protein, healthy fats, whole grains, fruits and vegetables - ask your doctor what is your Target blood pressure range at next office visit - call your provider office for new concerns or questions - review education on smoking. Plan to discuss at our next telephone call. Follow Up Plan: Telephone follow up appointment with care management team member scheduled for: 04/08/21 The patient has been provided with contact information for the care management team and has been advised to call with any health related questions or concerns.  Plan:The patient has been provided with contact information for the care management team and has been advised to call with any health related questions or concerns.  and The care management team will reach out to the patient again over the next 45 days.  Thea Silversmith, RN, MSN, BSN, CCM Care Management Coordinator [facility] [phone]

## 2021-03-09 NOTE — Patient Instructions (Addendum)
Visit Information  PATIENT GOALS:  Goals Addressed             This Visit's Progress    Track and Manage My Blood Pressure-Hypertension   On track    Timeframe:  Long-Range Goal Priority:  Medium Start Date:  01/22/21                           Expected End Date:  06/24/21                     Follow Up Date 04/08/21  Patient Goals: - continue to monitor blood pressure per provider recommendation - continue to take medications as prescribed - continue to attend provider appointment as scheduled - continue to eat healthy: lean meats/protein, healthy fats, whole grains, fruits and vegetables and monitor salt intake. - ask your doctor what is your Target blood pressure range at next office visit - call your provider office for new concerns or questions - review education on smoking. Plan to discuss at our next telephone call.   Why is this important?   You won't feel high blood pressure, but it can still hurt your blood vessels.  High blood pressure can cause heart or kidney problems. It can also cause a stroke.  Making lifestyle changes like losing a little weight or eating less salt will help.  Checking your blood pressure at home and at different times of the day can help to control blood pressure.  If the doctor prescribes medicine remember to take it the way the doctor ordered.  Call the office if you cannot afford the medicine or if there are questions about it.       Track and Manage My Symptoms-COPD   On track    Timeframe:  Long-Range Goal Priority:  High Start Date:  01/22/21                           Expected End Date: 06/24/21                      Follow Up Date 04/08/21    Patient Goals:  -- continue to take medications as prescribed - Smoking Quit line [phone] - continue to attend provider appointment as scheduled - continue to eat healthy: plenty of protein, lean meats, healthy fats, whole grains, fruits and vegetables. Monitor salt intake. - continue to drink  your Ensure supplements three times a day as recommended - continue to follow rescue plan if symptoms flare-up and know when to call the doctor - continue to track symptoms and what helps feel better or worse - call your provider office for new concerns or questions - review information on smoking cessation provided by mail. Plan to discuss at next telephone call. Important safety factors to keep in mind if you are using oxygen: Never smoke or light flames around the oxygen device. Do not let others smoke around you. Never use the oxygen device near a gas heater or stove. Avoid using lotions and creams with petroleum in them. Be sure to store the oxygen tanks upright. Make sure they cannot fall over. Turn off the oxygen supply when not in use. Have working smoke detectors in the home.   Why is this important?   Tracking your symptoms and other information about your health helps your doctor plan your care.  Write down the symptoms, the time of  day, what you were doing and what medicine you are taking.  You will soon learn how to manage your symptoms.         COPD Action Plan A COPD action plan is a description of what to do when you have a flare (exacerbation) of chronic obstructive pulmonary disease (COPD). Your action plan is a color-coded plan that lists the symptoms that indicate whether your condition is under control and what actions to take. If you have symptoms in the green zone, it means you are doing well that day. If you have symptoms in the yellow zone, it means you are having a bad day or an exacerbation. If you have symptoms in the red zone, you need urgent medical care. Follow the plan that you and your health care provider developed. Review yourplan with your health care provider at each visit. Red zone Symptoms in this zone mean that you should get medical help right away. They include: Feeling very short of breath, even when you are resting. Not being able to do any  activities because of poor breathing. Not being able to sleep because of poor breathing. Fever or shaking chills. Feeling confused or very sleepy. Chest pain. Coughing up blood. If you have any of these symptoms, call emergency services (911 in the [place] or go to the nearest emergency room. Yellow zone Symptoms in this zone mean that your condition may be getting worse. They include: Feeling more short of breath than usual. Having less energy for daily activities than usual. Phlegm or mucus that is thicker than usual. Needing to use your rescue inhaler or nebulizer more often than usual. More ankle swelling than usual. Coughing more than usual. Feeling like you have a chest cold. Trouble sleeping due to COPD symptoms. Decreased appetite. COPD medicines not helping as much as usual. If you experience any "yellow" symptoms: Keep taking your daily medicines as directed. Use your quick-relief inhaler as told by your health care provider. If you were prescribed steroid medicine to take by mouth (oral medicine), start taking it as told by your health care provider. If you were prescribed an antibiotic medicine, start taking it as told by your health care provider. Do not stop taking the antibiotic even if you start to feel better. Use oxygen as told by your health care provider. Get more rest. Do your pursed-lip breathing exercises. Do not smoke. Avoid any irritants in the air. If your signs and symptoms do not improve after taking these steps, call yourhealth care provider right away. Green zone Symptoms in this zone mean that you are doing well. They include: Being able to do your usual activities and exercise. Having the usual amount of coughing, including the same amount of phlegm or mucus. Being able to sleep well. Having a good appetite. Where to find more information: You can find more information about COPD from: American Lung Association, My COPD Action Plan:  www.lung.org COPD Foundation: www.copdfoundation.[place]: https://wilson-eaton.com/ Follow these instructions at home: Continue taking your daily medicines as told by your health care provider. Make sure you receive all the immunizations that your health care provider recommends, especially the pneumococcal and influenza vaccines. Wash your hands often with soap and water. Have family members wash their hands too. Regular hand washing can help prevent infections. Follow your usual exercise and diet plan. Avoid irritants in the air, such as smoke. Do not use any products that contain nicotine or tobacco. These products include cigarettes, chewing  tobacco, and vaping devices, such as e-cigarettes. If you need help quitting, ask your health care provider. Summary A COPD action plan tells you what to do when you have a flare (exacerbation) of chronic obstructive pulmonary disease (COPD). Follow each action plan for your symptoms. If you have any symptoms in the red zone, call emergency services (911 in the [place] or go to the nearest emergency room. This information is not intended to replace advice given to you by your health care provider. Make sure you discuss any questions you have with your healthcare provider. Document Revised: 05/19/2020 Document Reviewed: 05/19/2020 Elsevier Patient Education  2022 [employer].   The patient verbalized understanding of instructions, educational materials, and care plan provided today and agreed to receive a mailed copy of patient instructions, educational materials, and care plan.   Telephone follow up appointment with care management team member scheduled for: 04/08/21 The patient has been provided with contact information for the care management team and has been advised to call with any health related questions or concerns.   Thea Silversmith, RN, MSN, BSN, CCM Care Management Coordinator [facility] [phone]

## 2021-03-11 ENCOUNTER — Telehealth: Payer: Self-pay | Admitting: Family

## 2021-03-11 NOTE — Telephone Encounter (Signed)
Left message for patient to call back and schedule Medicare Annual Wellness Visit (AWV) in office.   If not able to come in office, please offer to do virtually or by telephone.  Left office number and my jabber #[phone].  Due for AWVI  Please schedule at anytime with Nurse Health Advisor.   

## 2021-03-16 DIAGNOSIS — Z9981 Dependence on supplemental oxygen: Secondary | ICD-10-CM | POA: Diagnosis not present

## 2021-03-16 DIAGNOSIS — J449 Chronic obstructive pulmonary disease, unspecified: Secondary | ICD-10-CM | POA: Diagnosis not present

## 2021-03-16 DIAGNOSIS — R64 Cachexia: Secondary | ICD-10-CM | POA: Diagnosis not present

## 2021-03-16 DIAGNOSIS — J9601 Acute respiratory failure with hypoxia: Secondary | ICD-10-CM | POA: Diagnosis not present

## 2021-03-25 ENCOUNTER — Other Ambulatory Visit: Payer: Self-pay | Admitting: *Deleted

## 2021-03-25 DIAGNOSIS — C672 Malignant neoplasm of lateral wall of bladder: Secondary | ICD-10-CM

## 2021-03-25 MED ORDER — OXYCODONE HCL 5 MG PO TABS: ORAL_TABLET | Freq: Four times a day (QID) | ORAL | 0 refills | Status: DC | PRN

## 2021-03-28 DIAGNOSIS — J449 Chronic obstructive pulmonary disease, unspecified: Secondary | ICD-10-CM | POA: Diagnosis not present

## 2021-04-08 ENCOUNTER — Telehealth: Payer: Self-pay

## 2021-04-08 ENCOUNTER — Telehealth: Payer: Medicare Other

## 2021-04-08 NOTE — Telephone Encounter (Signed)
  Care Management   Follow Up Note   04/08/2021 Name: Donna Curry MRN: [id number] DOB: 10-28-1955   Referred by: [name], NP Reason for referral : Chronic Care Management (RNCM follow up)   An unsuccessful telephone outreach was attempted today. The patient was referred to the case management team for assistance with care management and care coordination.   Follow Up Plan: The care management team will reach out to the patient again over the next 30 days.   Thea Silversmith, RN, MSN, BSN, CCM Care Management Coordinator [facility] [phone]

## 2021-04-16 DIAGNOSIS — Z9981 Dependence on supplemental oxygen: Secondary | ICD-10-CM | POA: Diagnosis not present

## 2021-04-16 DIAGNOSIS — R64 Cachexia: Secondary | ICD-10-CM | POA: Diagnosis not present

## 2021-04-16 DIAGNOSIS — J9601 Acute respiratory failure with hypoxia: Secondary | ICD-10-CM | POA: Diagnosis not present

## 2021-04-16 DIAGNOSIS — J449 Chronic obstructive pulmonary disease, unspecified: Secondary | ICD-10-CM | POA: Diagnosis not present

## 2021-04-22 ENCOUNTER — Inpatient Hospital Stay: Payer: Medicare Other

## 2021-04-22 ENCOUNTER — Inpatient Hospital Stay: Payer: Medicare Other | Admitting: Hematology & Oncology

## 2021-04-26 ENCOUNTER — Inpatient Hospital Stay (HOSPITAL_BASED_OUTPATIENT_CLINIC_OR_DEPARTMENT_OTHER): Payer: Medicare Other | Admitting: Family

## 2021-04-26 ENCOUNTER — Inpatient Hospital Stay: Payer: Medicare Other

## 2021-04-26 ENCOUNTER — Inpatient Hospital Stay: Payer: Medicare Other | Attending: Hematology & Oncology

## 2021-04-26 ENCOUNTER — Encounter: Payer: Self-pay | Admitting: Family

## 2021-04-26 ENCOUNTER — Other Ambulatory Visit: Payer: Self-pay

## 2021-04-26 VITALS — BP 145/80 | HR 85 | Temp 98.4°F | Resp 18 | Ht 62.0 in | Wt 76.8 lb

## 2021-04-26 DIAGNOSIS — F418 Other specified anxiety disorders: Secondary | ICD-10-CM

## 2021-04-26 DIAGNOSIS — D500 Iron deficiency anemia secondary to blood loss (chronic): Secondary | ICD-10-CM | POA: Insufficient documentation

## 2021-04-26 DIAGNOSIS — C679 Malignant neoplasm of bladder, unspecified: Secondary | ICD-10-CM | POA: Insufficient documentation

## 2021-04-26 DIAGNOSIS — C671 Malignant neoplasm of dome of bladder: Secondary | ICD-10-CM

## 2021-04-26 DIAGNOSIS — C779 Secondary and unspecified malignant neoplasm of lymph node, unspecified: Secondary | ICD-10-CM | POA: Insufficient documentation

## 2021-04-26 DIAGNOSIS — Z79899 Other long term (current) drug therapy: Secondary | ICD-10-CM | POA: Diagnosis not present

## 2021-04-26 DIAGNOSIS — R110 Nausea: Secondary | ICD-10-CM | POA: Diagnosis not present

## 2021-04-26 DIAGNOSIS — C672 Malignant neoplasm of lateral wall of bladder: Secondary | ICD-10-CM

## 2021-04-26 DIAGNOSIS — R4589 Other symptoms and signs involving emotional state: Secondary | ICD-10-CM

## 2021-04-26 DIAGNOSIS — T451X5A Adverse effect of antineoplastic and immunosuppressive drugs, initial encounter: Secondary | ICD-10-CM

## 2021-04-26 DIAGNOSIS — Z5112 Encounter for antineoplastic immunotherapy: Secondary | ICD-10-CM | POA: Insufficient documentation

## 2021-04-26 LAB — CMP (CANCER CENTER ONLY)
ALT: 13 U/L (ref 0–44)
AST: 15 U/L (ref 15–41)
Albumin: 4.1 g/dL (ref 3.5–5.0)
Alkaline Phosphatase: 80 U/L (ref 38–126)
Anion gap: 6 (ref 5–15)
BUN: 10 mg/dL (ref 8–23)
CO2: 34 mmol/L — ABNORMAL HIGH (ref 22–32)
Calcium: 9.7 mg/dL (ref 8.9–10.3)
Chloride: 104 mmol/L (ref 98–111)
Creatinine: 0.74 mg/dL (ref 0.44–1.00)
GFR, Estimated: >60 mL/min (ref >60)
Glucose, Bld: 113 mg/dL — ABNORMAL HIGH (ref 70–99)
Potassium: 3.7 mmol/L (ref 3.5–5.1)
Sodium: 144 mmol/L (ref 135–145)
Total Bilirubin: 0.3 mg/dL (ref 0.3–1.2)
Total Protein: 6.7 g/dL (ref 6.5–8.1)

## 2021-04-26 LAB — CBC WITH DIFFERENTIAL (CANCER CENTER ONLY)
Abs Immature Granulocytes: 0.01 10*3/uL (ref 0.00–0.07)
Basophils Absolute: 0 10*3/uL (ref 0.0–0.1)
Basophils Relative: 0 %
Eosinophils Absolute: 0.1 10*3/uL (ref 0.0–0.5)
Eosinophils Relative: 2 %
HCT: 36 % (ref 36.0–46.0)
Hemoglobin: 11.6 g/dL — ABNORMAL LOW (ref 12.0–15.0)
Immature Granulocytes: 0 %
Lymphocytes Relative: 17 %
Lymphs Abs: 1 10*3/uL (ref 0.7–4.0)
MCH: 31.7 pg (ref 26.0–34.0)
MCHC: 32.2 g/dL (ref 30.0–36.0)
MCV: 98.4 fL (ref 80.0–100.0)
Monocytes Absolute: 0.4 10*3/uL (ref 0.1–1.0)
Monocytes Relative: 8 %
Neutro Abs: 4.1 10*3/uL (ref 1.7–7.7)
Neutrophils Relative %: 73 %
Platelet Count: 203 10*3/uL (ref 150–400)
RBC: 3.66 MIL/uL — ABNORMAL LOW (ref 3.87–5.11)
RDW: 13.5 % (ref 11.5–15.5)
WBC Count: 5.6 10*3/uL (ref 4.0–10.5)
nRBC: 0 % (ref 0.0–0.2)

## 2021-04-26 LAB — IRON AND TIBC
Iron: 48 ug/dL (ref 41–142)
Saturation Ratios: 17 % — ABNORMAL LOW (ref 21–57)
TIBC: 278 ug/dL (ref 236–444)
UIBC: 229 ug/dL (ref 120–384)

## 2021-04-26 LAB — FERRITIN: Ferritin: 36 ng/mL (ref 11–307)

## 2021-04-26 LAB — RETICULOCYTES
Immature Retic Fract: 7.6 % (ref 2.3–15.9)
RBC.: 3.66 MIL/uL — ABNORMAL LOW (ref 3.87–5.11)
Retic Count, Absolute: 53.1 10*3/uL (ref 19.0–186.0)
Retic Ct Pct: 1.5 % (ref 0.4–3.1)

## 2021-04-26 MED ORDER — HEPARIN SOD (PORK) LOCK FLUSH 100 UNIT/ML IV SOLN
500.0000 [IU] | Freq: Once | INTRAVENOUS | Status: AC | PRN
  Administered 2021-04-26: 500 [IU]

## 2021-04-26 MED ORDER — SODIUM CHLORIDE 0.9% FLUSH
10.0000 mL | INTRAVENOUS | Status: DC | PRN
  Administered 2021-04-26: 10 mL

## 2021-04-26 MED ORDER — SODIUM CHLORIDE 0.9 % IV SOLN
400.0000 mg | Freq: Once | INTRAVENOUS | Status: AC
  Administered 2021-04-26: 400 mg via INTRAVENOUS
  Filled 2021-04-26: qty 16

## 2021-04-26 MED ORDER — DIAZEPAM 5 MG PO TABS: 5.0000 mg | ORAL_TABLET | Freq: Three times a day (TID) | ORAL | 0 refills | Status: DC | PRN

## 2021-04-26 MED ORDER — SODIUM CHLORIDE 0.9 % IV SOLN: Freq: Once | INTRAVENOUS | Status: AC

## 2021-04-26 MED ORDER — OXYCODONE HCL 5 MG PO TABS: ORAL_TABLET | Freq: Four times a day (QID) | ORAL | 0 refills | Status: DC | PRN

## 2021-04-26 NOTE — Progress Notes (Signed)
Hematology and Oncology Follow Up Visit  Donna Curry [id number] [birth year] 65 y.o. 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  She is doing well but states she stays in the bed most of the time and watches TV. No falls or syncope to report.  She states that she has a great appetite and is staying well hydrated. Her weight is stable at 76 lbs.  She has a chronic cough with COPD and takes Mucinex DM as needed. Phlegm on occasion is white/clear. Mild SOB with over exertion. She will take a break to rest when needed.  No fever, chills, n/v, rash, dizziness, chest pain, palpitations, abdominal pain or changes in bowel or bladder habits.  No blood loss noted. No bruising or petechiae.   ECOG Performance Status: 2 - Symptomatic, <50% confined to bed  Medications:  Allergies as of 04/26/2021   No Known Allergies      Medication List        Accurate as of April 26, 2021 10:16 AM. If you have any questions, ask your nurse or doctor.          acetaminophen 500 MG tablet Commonly known as: TYLENOL Take 1,000 mg by mouth every 8 (eight) hours as needed for moderate pain.   albuterol (2.5 MG/3ML) 0.083% nebulizer solution Commonly known as: PROVENTIL Take 3 mLs (2.5 mg total) by nebulization every 6 (six) hours as needed for wheezing or shortness of breath. Use 3 times daily x 4 days then every 6 hours as needed.   amitriptyline 10 MG tablet Commonly known as: ELAVIL Take 1 tablet (10 mg total) by mouth at bedtime as needed for sleep.   amLODipine 10 MG  tablet Commonly known as: NORVASC Take 1 tablet (10 mg total) by mouth daily.   aspirin EC 81 MG tablet Take 81 mg by mouth daily.   benzonatate 100 MG capsule Commonly known as: TESSALON Take 1 capsule (100 mg total) by mouth 2 (two) times daily as needed for cough.   citalopram 10 MG tablet Commonly known as: CeleXA Take 1 tablet (10 mg total) by mouth daily.   diazepam 5 MG tablet Commonly known as: VALIUM Take 1 tablet (5 mg total) by mouth every 8 (eight) hours as needed for anxiety.   feeding supplement Liqd Take 237 mLs by mouth 2 (two) times daily between meals.   loratadine 10 MG tablet Commonly known as: CLARITIN Take 1 tablet (10 mg total) by mouth daily.   ondansetron 8 MG tablet Commonly known as: ZOFRAN Take 1 tablet (8 mg total) by mouth every 8 (eight) hours as needed for nausea or vomiting.   oxyCODONE 5 MG immediate release tablet Commonly known as: Oxy IR/ROXICODONE TAKE 1 TABLET BY MOUTH EVERY 6 HOURS AS NEEDED FOR SEVERE PAIN   pantoprazole 40 MG tablet Commonly known as: PROTONIX Take 1 tablet (40 mg total) by mouth daily at 6 (six) AM.   prochlorperazine 10 MG tablet Commonly known as: COMPAZINE Take 1 tablet (10 mg total) by mouth every 6 (six) hours  as needed for nausea or vomiting.   Trelegy Ellipta 100-62.5-25 MCG/INH Aepb Generic drug: Fluticasone-Umeclidin-Vilant Inhale 1 puff into the lungs in the morning and at bedtime.   Vitamin D (Ergocalciferol) 1.25 MG (50000 UNIT) Caps capsule Commonly known as: DRISDOL Take 1 capsule (50,000 Units total) by mouth once a week.        Allergies: No Known Allergies  Past Medical History, Surgical history, Social history, and Family History were reviewed and updated.  Review of Systems: All other 10 point review of systems is negative.   Physical Exam:  height is 5\' 2"  (1.575 m) and weight is 76 lb 12.8 oz (34.8 kg). Her oral temperature is 98.4 F (36.9 C). Her blood pressure is 145/80  (abnormal) and her pulse is 85. Her respiration is 18 and oxygen saturation is 100%.   Wt Readings from Last 3 Encounters:  04/26/21 76 lb 12.8 oz (34.8 kg)  02/15/21 75 lb (34 kg)  01/15/21 72 lb 12.8 oz (33 kg)    Ocular: Sclerae unicteric, pupils equal, round and reactive to light Ear-nose-throat: Oropharynx clear, dentition fair Lymphatic: No cervical or supraclavicular adenopathy Lungs no rales or rhonchi, good excursion bilaterally Heart regular rate and rhythm, no murmur appreciated Abd soft, nontender, positive bowel sounds MSK no focal spinal tenderness, no joint edema Neuro: non-focal, well-oriented, appropriate affect Breasts: Deferred   Lab Results  Component Value Date   WBC 5.5 02/15/2021   HGB 10.4 (L) 02/15/2021   HCT 32.6 (L) 02/15/2021   MCV 92.6 02/15/2021   PLT 237 02/15/2021   Lab Results  Component Value Date   FERRITIN 14 11/10/2020   IRON 25 (L) 11/10/2020   TIBC 383 11/10/2020   UIBC 358 11/10/2020   IRONPCTSAT 7 (L) 11/10/2020   Lab Results  Component Value Date   RBC 3.52 (L) 02/15/2021   No results found for: KPAFRELGTCHN, LAMBDASER, KAPLAMBRATIO No results found for: IGGSERUM, IGA, IGMSERUM No results found for: [name], SPEI   Chemistry      Component Value Date/Time   NA 142 02/15/2021 0903   K 4.2 02/15/2021 0903   CL 101 02/15/2021 0903   CO2 35 (H) 02/15/2021 0903   BUN 9 02/15/2021 0903   CREATININE 0.74 02/15/2021 0903   CREATININE 1.10 08/20/2013 1027      Component Value Date/Time   CALCIUM 10.0 02/15/2021 0903   ALKPHOS 87 02/15/2021 0903   AST 14 (L) 02/15/2021 0903   ALT 14 02/15/2021 0903   BILITOT 0.3 02/15/2021 0903       Impression and Plan: Donna Curry is a very pleasant 65 yo African American female with 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  We will proceed with treatment today as planned.  I spoke with Dr.  Marin Olp and we will continue to follow labs and symptoms. No scans needed at this time.  Iron studies are pending. We will repalce if needed.  Follow-up in 2 months.  They can contact our office with any questions or concerns.   Lottie Dawson, NP 10/3/202210:16 AM

## 2021-04-27 ENCOUNTER — Telehealth: Payer: Self-pay | Admitting: *Deleted

## 2021-04-27 DIAGNOSIS — J449 Chronic obstructive pulmonary disease, unspecified: Secondary | ICD-10-CM | POA: Diagnosis not present

## 2021-04-27 NOTE — Telephone Encounter (Signed)
Per 04/26/21 los - called and lvm of upcoming appointments - requested callback to confirm - mailed calendar

## 2021-05-16 DIAGNOSIS — Z9981 Dependence on supplemental oxygen: Secondary | ICD-10-CM | POA: Diagnosis not present

## 2021-05-16 DIAGNOSIS — J449 Chronic obstructive pulmonary disease, unspecified: Secondary | ICD-10-CM | POA: Diagnosis not present

## 2021-05-16 DIAGNOSIS — R64 Cachexia: Secondary | ICD-10-CM | POA: Diagnosis not present

## 2021-05-16 DIAGNOSIS — J9601 Acute respiratory failure with hypoxia: Secondary | ICD-10-CM | POA: Diagnosis not present

## 2021-05-18 ENCOUNTER — Telehealth: Payer: Self-pay | Admitting: Family

## 2021-05-18 NOTE — Telephone Encounter (Signed)
Pt called regarding prescription for portable oxygen machine. She stated it would be a huge help to her and she will be able to get out more. Please advise.

## 2021-05-19 NOTE — Telephone Encounter (Signed)
It looks like she is past due to follow up with Dr. [place] Bells. I would recommend that she follow up with them to discuss ongoing oxygen needs.

## 2021-05-19 NOTE — Telephone Encounter (Signed)
Patient advised to call Dr. [place] Bells for follow up appointment and assessment. Phone number was provided.

## 2021-05-27 ENCOUNTER — Other Ambulatory Visit: Payer: Self-pay

## 2021-05-27 DIAGNOSIS — F418 Other specified anxiety disorders: Secondary | ICD-10-CM

## 2021-05-27 DIAGNOSIS — C672 Malignant neoplasm of lateral wall of bladder: Secondary | ICD-10-CM

## 2021-05-27 DIAGNOSIS — C671 Malignant neoplasm of dome of bladder: Secondary | ICD-10-CM

## 2021-05-27 MED ORDER — DIAZEPAM 5 MG PO TABS: 5.0000 mg | ORAL_TABLET | Freq: Three times a day (TID) | ORAL | 0 refills | Status: DC | PRN

## 2021-05-27 MED ORDER — OXYCODONE HCL 5 MG PO TABS
5.0000 mg | ORAL_TABLET | Freq: Four times a day (QID) | ORAL | 0 refills | Status: DC | PRN
Start: 2021-05-27 — End: 2021-06-28

## 2021-05-27 NOTE — Telephone Encounter (Signed)
Pt called for refill on oxycodone and valium. Refill request sent.

## 2021-05-28 ENCOUNTER — Other Ambulatory Visit: Payer: Self-pay | Admitting: Family

## 2021-05-28 DIAGNOSIS — I10 Essential (primary) hypertension: Secondary | ICD-10-CM

## 2021-05-28 DIAGNOSIS — J449 Chronic obstructive pulmonary disease, unspecified: Secondary | ICD-10-CM | POA: Diagnosis not present

## 2021-05-28 DIAGNOSIS — C672 Malignant neoplasm of lateral wall of bladder: Secondary | ICD-10-CM

## 2021-06-16 DIAGNOSIS — Z9981 Dependence on supplemental oxygen: Secondary | ICD-10-CM | POA: Diagnosis not present

## 2021-06-16 DIAGNOSIS — R64 Cachexia: Secondary | ICD-10-CM | POA: Diagnosis not present

## 2021-06-16 DIAGNOSIS — J9601 Acute respiratory failure with hypoxia: Secondary | ICD-10-CM | POA: Diagnosis not present

## 2021-06-16 DIAGNOSIS — J449 Chronic obstructive pulmonary disease, unspecified: Secondary | ICD-10-CM | POA: Diagnosis not present

## 2021-06-25 ENCOUNTER — Telehealth: Payer: Self-pay | Admitting: Hematology & Oncology

## 2021-06-25 NOTE — Telephone Encounter (Signed)
Called patient per 12/2 sch msg - no answer and no vmail. Left appts as is

## 2021-06-27 DIAGNOSIS — J449 Chronic obstructive pulmonary disease, unspecified: Secondary | ICD-10-CM | POA: Diagnosis not present

## 2021-06-28 ENCOUNTER — Inpatient Hospital Stay: Payer: Medicare Other | Admitting: Hematology & Oncology

## 2021-06-28 ENCOUNTER — Inpatient Hospital Stay: Payer: Medicare Other

## 2021-06-28 ENCOUNTER — Other Ambulatory Visit: Payer: Self-pay | Admitting: Hematology & Oncology

## 2021-06-28 DIAGNOSIS — C672 Malignant neoplasm of lateral wall of bladder: Secondary | ICD-10-CM

## 2021-06-28 MED ORDER — TRELEGY ELLIPTA 100-62.5-25 MCG/ACT IN AEPB: 1.0000 | INHALATION_SPRAY | Freq: Two times a day (BID) | RESPIRATORY_TRACT | 3 refills | Status: AC | PRN

## 2021-06-28 MED ORDER — OXYCODONE HCL 5 MG PO TABS: 5.0000 mg | ORAL_TABLET | Freq: Four times a day (QID) | ORAL | 0 refills | Status: DC | PRN

## 2021-07-04 ENCOUNTER — Other Ambulatory Visit: Payer: Self-pay | Admitting: Family

## 2021-07-04 DIAGNOSIS — C672 Malignant neoplasm of lateral wall of bladder: Secondary | ICD-10-CM

## 2021-07-04 DIAGNOSIS — I10 Essential (primary) hypertension: Secondary | ICD-10-CM

## 2021-07-11 ENCOUNTER — Other Ambulatory Visit: Payer: Self-pay

## 2021-07-16 DIAGNOSIS — R64 Cachexia: Secondary | ICD-10-CM | POA: Diagnosis not present

## 2021-07-16 DIAGNOSIS — J449 Chronic obstructive pulmonary disease, unspecified: Secondary | ICD-10-CM | POA: Diagnosis not present

## 2021-07-16 DIAGNOSIS — J9601 Acute respiratory failure with hypoxia: Secondary | ICD-10-CM | POA: Diagnosis not present

## 2021-07-16 DIAGNOSIS — Z9981 Dependence on supplemental oxygen: Secondary | ICD-10-CM | POA: Diagnosis not present

## 2021-07-20 ENCOUNTER — Other Ambulatory Visit: Payer: Self-pay

## 2021-07-20 ENCOUNTER — Inpatient Hospital Stay (HOSPITAL_BASED_OUTPATIENT_CLINIC_OR_DEPARTMENT_OTHER): Payer: Medicare Other | Admitting: Hematology & Oncology

## 2021-07-20 ENCOUNTER — Inpatient Hospital Stay: Payer: Medicare Other | Attending: Hematology & Oncology

## 2021-07-20 ENCOUNTER — Telehealth: Payer: Self-pay | Admitting: Family

## 2021-07-20 ENCOUNTER — Inpatient Hospital Stay: Payer: Medicare Other

## 2021-07-20 ENCOUNTER — Encounter: Payer: Self-pay | Admitting: Hematology & Oncology

## 2021-07-20 VITALS — BP 149/78 | HR 95 | Temp 98.4°F | Resp 18 | Wt 79.0 lb

## 2021-07-20 DIAGNOSIS — R110 Nausea: Secondary | ICD-10-CM

## 2021-07-20 DIAGNOSIS — Z5112 Encounter for antineoplastic immunotherapy: Secondary | ICD-10-CM | POA: Insufficient documentation

## 2021-07-20 DIAGNOSIS — C671 Malignant neoplasm of dome of bladder: Secondary | ICD-10-CM

## 2021-07-20 DIAGNOSIS — D500 Iron deficiency anemia secondary to blood loss (chronic): Secondary | ICD-10-CM

## 2021-07-20 DIAGNOSIS — G8929 Other chronic pain: Secondary | ICD-10-CM | POA: Insufficient documentation

## 2021-07-20 DIAGNOSIS — C679 Malignant neoplasm of bladder, unspecified: Secondary | ICD-10-CM | POA: Insufficient documentation

## 2021-07-20 DIAGNOSIS — C670 Malignant neoplasm of trigone of bladder: Secondary | ICD-10-CM | POA: Diagnosis not present

## 2021-07-20 DIAGNOSIS — F418 Other specified anxiety disorders: Secondary | ICD-10-CM

## 2021-07-20 DIAGNOSIS — C672 Malignant neoplasm of lateral wall of bladder: Secondary | ICD-10-CM

## 2021-07-20 DIAGNOSIS — Z87891 Personal history of nicotine dependence: Secondary | ICD-10-CM | POA: Insufficient documentation

## 2021-07-20 DIAGNOSIS — T451X5A Adverse effect of antineoplastic and immunosuppressive drugs, initial encounter: Secondary | ICD-10-CM

## 2021-07-20 LAB — CBC WITH DIFFERENTIAL (CANCER CENTER ONLY)
Abs Immature Granulocytes: 0.02 10*3/uL (ref 0.00–0.07)
Basophils Absolute: 0 10*3/uL (ref 0.0–0.1)
Basophils Relative: 0 %
Eosinophils Absolute: 0.3 10*3/uL (ref 0.0–0.5)
Eosinophils Relative: 5 %
HCT: 36.8 % (ref 36.0–46.0)
Hemoglobin: 11.8 g/dL — ABNORMAL LOW (ref 12.0–15.0)
Immature Granulocytes: 0 %
Lymphocytes Relative: 19 %
Lymphs Abs: 1.2 10*3/uL (ref 0.7–4.0)
MCH: 30.6 pg (ref 26.0–34.0)
MCHC: 32.1 g/dL (ref 30.0–36.0)
MCV: 95.3 fL (ref 80.0–100.0)
Monocytes Absolute: 0.6 10*3/uL (ref 0.1–1.0)
Monocytes Relative: 10 %
Neutro Abs: 3.9 10*3/uL (ref 1.7–7.7)
Neutrophils Relative %: 66 %
Platelet Count: 211 10*3/uL (ref 150–400)
RBC: 3.86 MIL/uL — ABNORMAL LOW (ref 3.87–5.11)
RDW: 15.3 % (ref 11.5–15.5)
WBC Count: 6 10*3/uL (ref 4.0–10.5)
nRBC: 0 % (ref 0.0–0.2)

## 2021-07-20 LAB — CMP (CANCER CENTER ONLY)
ALT: 18 U/L (ref 0–44)
AST: 14 U/L — ABNORMAL LOW (ref 15–41)
Albumin: 4.2 g/dL (ref 3.5–5.0)
Alkaline Phosphatase: 102 U/L (ref 38–126)
Anion gap: 7 (ref 5–15)
BUN: 15 mg/dL (ref 8–23)
CO2: 31 mmol/L (ref 22–32)
Calcium: 10.1 mg/dL (ref 8.9–10.3)
Chloride: 104 mmol/L (ref 98–111)
Creatinine: 0.8 mg/dL (ref 0.44–1.00)
GFR, Estimated: >60 mL/min (ref >60)
Glucose, Bld: 93 mg/dL (ref 70–99)
Potassium: 3.9 mmol/L (ref 3.5–5.1)
Sodium: 142 mmol/L (ref 135–145)
Total Bilirubin: 0.2 mg/dL — ABNORMAL LOW (ref 0.3–1.2)
Total Protein: 7.1 g/dL (ref 6.5–8.1)

## 2021-07-20 LAB — RETICULOCYTES
Immature Retic Fract: 15.1 % (ref 2.3–15.9)
RBC.: 3.85 MIL/uL — ABNORMAL LOW (ref 3.87–5.11)
Retic Count, Absolute: 61.6 10*3/uL (ref 19.0–186.0)
Retic Ct Pct: 1.6 % (ref 0.4–3.1)

## 2021-07-20 LAB — IRON AND IRON BINDING CAPACITY (CC-WL,HP ONLY)
Iron: 37 ug/dL (ref 28–170)
Saturation Ratios: 9 % — ABNORMAL LOW (ref 10.4–31.8)
TIBC: 399 ug/dL (ref 250–450)
UIBC: 362 ug/dL (ref 148–442)

## 2021-07-20 MED ORDER — SODIUM CHLORIDE 0.9 % IV SOLN: Freq: Once | INTRAVENOUS | Status: AC

## 2021-07-20 MED ORDER — SODIUM CHLORIDE 0.9 % IV SOLN
400.0000 mg | Freq: Once | INTRAVENOUS | Status: AC
  Administered 2021-07-20: 13:00:00 400 mg via INTRAVENOUS
  Filled 2021-07-20: qty 16

## 2021-07-20 MED ORDER — ONDANSETRON HCL 8 MG PO TABS: 8.0000 mg | ORAL_TABLET | Freq: Three times a day (TID) | ORAL | 0 refills | Status: AC | PRN

## 2021-07-20 MED ORDER — PROCHLORPERAZINE MALEATE 10 MG PO TABS: 10.0000 mg | ORAL_TABLET | Freq: Four times a day (QID) | ORAL | 0 refills | Status: AC | PRN

## 2021-07-20 MED ORDER — SODIUM CHLORIDE 0.9 % IV SOLN
510.0000 mg | Freq: Once | INTRAVENOUS | Status: AC
  Administered 2021-07-20: 14:00:00 510 mg via INTRAVENOUS
  Filled 2021-07-20: qty 17

## 2021-07-20 MED ORDER — HEPARIN SOD (PORK) LOCK FLUSH 100 UNIT/ML IV SOLN
500.0000 [IU] | Freq: Once | INTRAVENOUS | Status: AC | PRN
  Administered 2021-07-20: 14:00:00 500 [IU]

## 2021-07-20 MED ORDER — OXYCODONE HCL 5 MG PO TABS: 5.0000 mg | ORAL_TABLET | Freq: Four times a day (QID) | ORAL | 0 refills | Status: DC | PRN

## 2021-07-20 MED ORDER — SODIUM CHLORIDE 0.9% FLUSH
10.0000 mL | INTRAVENOUS | Status: DC | PRN
  Administered 2021-07-20: 14:00:00 10 mL

## 2021-07-20 MED ORDER — DIAZEPAM 5 MG PO TABS: 5.0000 mg | ORAL_TABLET | Freq: Three times a day (TID) | ORAL | 0 refills | Status: AC | PRN

## 2021-07-20 NOTE — Patient Instructions (Signed)

## 2021-07-20 NOTE — Patient Instructions (Signed)
[facility] [place]  Discharge Instructions: Thank you for choosing [facility] to provide your oncology and hematology care.   If you have a lab appointment with the [facility], please go directly to the [facility] and check in at the registration area.  Wear comfortable clothing and clothing appropriate for easy access to any Portacath or PICC line.   We strive to give you quality time with your provider. You may need to reschedule your appointment if you arrive late (15 or more minutes).  Arriving late affects you and other patients whose appointments are after yours.  Also, if you miss three or more appointments without notifying the office, you may be dismissed from the clinic at the providers discretion.      For prescription refill requests, have your pharmacy contact our office and allow 72 hours for refills to be completed.    Today you received the following chemotherapy and/or immunotherapy agents Keytruda      To help prevent nausea and vomiting after your treatment, we encourage you to take your nausea medication as directed.  BELOW ARE SYMPTOMS THAT SHOULD BE REPORTED IMMEDIATELY: *FEVER GREATER THAN 100.4 F (38 C) OR HIGHER *CHILLS OR SWEATING *NAUSEA AND VOMITING THAT IS NOT CONTROLLED WITH YOUR NAUSEA MEDICATION *UNUSUAL SHORTNESS OF BREATH *UNUSUAL BRUISING OR BLEEDING *URINARY PROBLEMS (pain or burning when urinating, or frequent urination) *BOWEL PROBLEMS (unusual diarrhea, constipation, pain near the anus) TENDERNESS IN MOUTH AND THROAT WITH OR WITHOUT PRESENCE OF ULCERS (sore throat, sores in mouth, or a toothache) UNUSUAL RASH, SWELLING OR PAIN  UNUSUAL VAGINAL DISCHARGE OR ITCHING   Items with * indicate a potential emergency and should be followed up as soon as possible or go to the Emergency Department if any problems should occur.  Please show the CHEMOTHERAPY ALERT CARD or IMMUNOTHERAPY ALERT CARD at check-in to the  Emergency Department and triage nurse. Should you have questions after your visit or need to cancel or reschedule your appointment, please contact [facility]  [phone] and follow the prompts.  Office hours are 8:00 a.m. to 4:30 p.m. Monday - Friday. Please note that voicemails left after 4:00 p.m. may not be returned until the following business day.  We are closed weekends and major holidays. You have access to a nurse at all times for urgent questions. Please call the main number to the clinic [phone] and follow the prompts.  For any non-urgent questions, you may also contact your provider using MyChart. We now offer e-Visits for anyone 65 and older to request care online for non-urgent symptoms. For details visit [email or web address].   Also download the MyChart app! Go to the app store, search "MyChart", open the app, select [facility], and log in with your MyChart username and password.  Due to Covid, a mask is required upon entering the hospital/clinic. If you do not have a mask, one will be given to you upon arrival. For doctor visits, patients may have 1 support person aged 65 or older with them. For treatment visits, patients cannot have anyone with them due to current Covid guidelines and our immunocompromised population.

## 2021-07-20 NOTE — Telephone Encounter (Signed)
Pt came in office stating needing refills on citalopram (CELEXA) 10 MG tablet, benzonatate (TESSALON) 100 MG capsule, and Vitamin D, Ergocalciferol, (DRISDOL) 1.25 MG (50000 UNIT) CAPS capsule sent to: [facility]  [place], [place] [address]  Phone:  [phone]  Fax:  [phone]. Please advise.

## 2021-07-20 NOTE — Telephone Encounter (Signed)
Pt. in office today, requesting refills on Valium, Zofran and Compazine. Please advise!

## 2021-07-20 NOTE — Progress Notes (Signed)
Hematology and Oncology Follow Up Visit  Donna Curry [id number] September 18, 1955 65 y.o. 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  So far, she has been doing pretty well.  She had a nice Thanksgiving and a nice Christmas.  She has not smoked for 2 months.  I am so happy for her for doing this.  Her appetite has been good.  She says she is been eating well.  She has had no nausea or vomiting.  She has had no problems with bowels or bladder.  She has had no cough.  She still has the chronic pain issues.  She has had no bleeding.  Her hemoglobin has improved nicely.  We did give her IV iron on occasion.  Back in October, her iron saturation was 17%.  I do not think we gave her iron at that time.  She has had no problems with leg swelling.  Overall, I would say her performance status is probably ECOG 2.   Medications:  Allergies as of 07/20/2021   No Known Allergies      Medication List        Accurate as of July 20, 2021 12:33 PM. If you have any questions, ask your nurse or doctor.          acetaminophen 500 MG tablet Commonly known as: TYLENOL Take 1,000 mg by mouth every 8 (eight) hours as needed for moderate pain.   albuterol (2.5 MG/3ML) 0.083% nebulizer solution Commonly known as: PROVENTIL Take 3 mLs (2.5 mg total) by nebulization every 6 (six) hours as needed for wheezing or shortness of breath. Use 3 times daily x 4 days then every 6 hours as needed.   amitriptyline 10 MG tablet Commonly known as:  ELAVIL TAKE 1 TABLET BY MOUTH AT BEDTIME AS NEEDED FOR SLEEP   amLODipine 10 MG tablet Commonly known as: NORVASC Take 1 tablet by mouth once daily   aspirin EC 81 MG tablet Take 81 mg by mouth daily.   benzonatate 100 MG capsule Commonly known as: TESSALON Take 1 capsule (100 mg total) by mouth 2 (two) times daily as needed for cough.   citalopram 10 MG tablet Commonly known as: CeleXA Take 1 tablet (10 mg total) by mouth daily.   diazepam 5 MG tablet Commonly known as: VALIUM Take 1 tablet (5 mg total) by mouth every 8 (eight) hours as needed for anxiety.   feeding supplement Liqd Take 237 mLs by mouth 2 (two) times daily between meals.   loratadine 10 MG tablet Commonly known as: CLARITIN Take 1 tablet (10 mg total) by mouth daily.   ondansetron 8 MG tablet Commonly known as: ZOFRAN Take 1 tablet (8 mg total) by mouth every 8 (eight) hours as needed for nausea or vomiting.   oxyCODONE 5 MG immediate release tablet Commonly known as: Oxy IR/ROXICODONE Take 1 tablet (5 mg total) by mouth every 6 (six) hours as needed for up to 30 doses for severe pain.   pantoprazole 40 MG tablet Commonly known  as: PROTONIX Take 1 tablet (40 mg total) by mouth daily at 6 (six) AM.   prochlorperazine 10 MG tablet Commonly known as: COMPAZINE Take 1 tablet (10 mg total) by mouth every 6 (six) hours as needed for nausea or vomiting.   Trelegy Ellipta 100-62.5-25 MCG/ACT Aepb Generic drug: Fluticasone-Umeclidin-Vilant Inhale 1 puff into the lungs in the morning and at bedtime.   Trelegy Ellipta 100-62.5-25 MCG/ACT Aepb Generic drug: Fluticasone-Umeclidin-Vilant Inhale 1 puff into the lungs every 12 (twelve) hours as needed.   Vitamin D (Ergocalciferol) 1.25 MG (50000 UNIT) Caps capsule Commonly known as: DRISDOL Take 1 capsule (50,000 Units total) by mouth once a week.        Allergies: No Known Allergies  Past Medical History, Surgical history, Social history, and Family  History were reviewed and updated.  Review of Systems: Review of Systems  Constitutional: Negative.   HENT: Negative.    Eyes: Negative.   Respiratory: Negative.    Cardiovascular: Negative.   Gastrointestinal:  Positive for abdominal pain.  Genitourinary:  Positive for frequency.  Musculoskeletal:  Positive for joint pain and myalgias.  Skin: Negative.   Neurological: Negative.   Endo/Heme/Allergies: Negative.   Psychiatric/Behavioral: Negative.      Physical Exam:  weight is 79 lb (35.8 kg). Her oral temperature is 98.4 F (36.9 C). Her blood pressure is 149/78 (abnormal) and her pulse is 95. Her respiration is 18 and oxygen saturation is 95%.   Wt Readings from Last 3 Encounters:  07/20/21 79 lb (35.8 kg)  04/26/21 76 lb 12.8 oz (34.8 kg)  02/15/21 75 lb (34 kg)    Physical Exam Vitals reviewed.  HENT:     Head: Normocephalic and atraumatic.  Eyes:     Pupils: Pupils are equal, round, and reactive to light.  Cardiovascular:     Rate and Rhythm: Normal rate and regular rhythm.     Heart sounds: Normal heart sounds.  Pulmonary:     Effort: Pulmonary effort is normal.     Breath sounds: Normal breath sounds.  Abdominal:     General: Bowel sounds are normal.     Palpations: Abdomen is soft.  Musculoskeletal:        General: No tenderness or deformity. Normal range of motion.     Cervical back: Normal range of motion.  Lymphadenopathy:     Cervical: No cervical adenopathy.  Skin:    General: Skin is warm and dry.     Findings: No erythema or rash.  Neurological:     Mental Status: She is alert and oriented to person, place, and time.  Psychiatric:        Behavior: Behavior normal.        Thought Content: Thought content normal.        Judgment: Judgment normal.     Lab Results  Component Value Date   WBC 6.0 07/20/2021   HGB 11.8 (L) 07/20/2021   HCT 36.8 07/20/2021   MCV 95.3 07/20/2021   PLT 211 07/20/2021   Lab Results  Component Value Date    FERRITIN 36 04/26/2021   IRON 48 04/26/2021   TIBC 278 04/26/2021   UIBC 229 04/26/2021   IRONPCTSAT 17 (L) 04/26/2021   Lab Results  Component Value Date   RETICCTPCT 1.6 07/20/2021   RBC 3.86 (L) 07/20/2021   RBC 3.85 (L) 07/20/2021   No results found for: KPAFRELGTCHN, LAMBDASER, KAPLAMBRATIO No results found for: IGGSERUM, IGA, IGMSERUM No results found for: TOTALPROTELP, ALBUMINELP, A1GS, A2GS,  Tillman Sers, SPEI   Chemistry      Component Value Date/Time   NA 142 07/20/2021 1039   K 3.9 07/20/2021 1039   CL 104 07/20/2021 1039   CO2 31 07/20/2021 1039   BUN 15 07/20/2021 1039   CREATININE 0.80 07/20/2021 1039   CREATININE 1.10 08/20/2013 1027      Component Value Date/Time   CALCIUM 10.1 07/20/2021 1039   ALKPHOS 102 07/20/2021 1039   AST 14 (L) 07/20/2021 1039   ALT 18 07/20/2021 1039   BILITOT 0.2 (L) 07/20/2021 1039       Impression and Plan: Donna Curry is a very pleasant 65 yo African American female with 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   We will go ahead with her immunotherapy today.  I will also try to give her some IV iron today.  I realize that her hemoglobin is doing well.  However, I think that IV iron would certainly be reasonable for her.  At some point, we will going to have to do another scan on her.  Her last PET scan was last year.  I think it be nice to try to get another PET scan on her to see how everything looks.   I will plan to get her back in 2 months.   [name], MD 12/27/202212:33 PM

## 2021-07-21 LAB — FERRITIN: Ferritin: 16 ng/mL (ref 11–307)

## 2021-07-22 MED ORDER — CITALOPRAM HYDROBROMIDE 10 MG PO TABS: 10.0000 mg | ORAL_TABLET | Freq: Every day | ORAL | 0 refills | Status: DC

## 2021-07-22 MED ORDER — BENZONATATE 100 MG PO CAPS: 100.0000 mg | ORAL_CAPSULE | Freq: Two times a day (BID) | ORAL | 0 refills | Status: AC | PRN

## 2021-07-22 NOTE — Telephone Encounter (Signed)
Patient notified and appt made for next week 07/30/21 at 140pm.  Citalopram and benzonatate sent in.

## 2021-07-23 ENCOUNTER — Telehealth: Payer: Self-pay

## 2021-07-23 ENCOUNTER — Telehealth: Payer: Medicare Other

## 2021-07-23 NOTE — Telephone Encounter (Signed)
°  Care Management   Follow Up Note   07/23/2021 Name: Donna Curry MRN: [id number] DOB: 23-Mar-1956   Referred by: [name], NP Reason for referral : No chief complaint on file.   An unsuccessful telephone outreach was attempted today. The patient was referred to the case management team for assistance with care management and care coordination.  A HIPPA compliant phone message was left for the patient providing contact information and requesting a return call.   Follow Up Plan: The Care Management team will outreach within the next 30 days.  Thea Silversmith, RN, MSN, BSN, CCM Care Management Coordinator [facility] [phone]

## 2021-07-28 DIAGNOSIS — J449 Chronic obstructive pulmonary disease, unspecified: Secondary | ICD-10-CM | POA: Diagnosis not present

## 2021-07-30 ENCOUNTER — Ambulatory Visit (INDEPENDENT_AMBULATORY_CARE_PROVIDER_SITE_OTHER): Payer: Medicare Other | Admitting: Family

## 2021-07-30 ENCOUNTER — Encounter: Payer: Self-pay | Admitting: Family

## 2021-07-30 ENCOUNTER — Telehealth: Payer: Self-pay | Admitting: *Deleted

## 2021-07-30 VITALS — BP 137/77 | HR 93 | Temp 98.4°F | Resp 16 | Wt 74.2 lb

## 2021-07-30 DIAGNOSIS — Z23 Encounter for immunization: Secondary | ICD-10-CM | POA: Diagnosis not present

## 2021-07-30 DIAGNOSIS — I10 Essential (primary) hypertension: Secondary | ICD-10-CM

## 2021-07-30 DIAGNOSIS — Z7185 Encounter for immunization safety counseling: Secondary | ICD-10-CM | POA: Diagnosis not present

## 2021-07-30 DIAGNOSIS — E43 Unspecified severe protein-calorie malnutrition: Secondary | ICD-10-CM | POA: Diagnosis not present

## 2021-07-30 DIAGNOSIS — F418 Other specified anxiety disorders: Secondary | ICD-10-CM | POA: Insufficient documentation

## 2021-07-30 DIAGNOSIS — C671 Malignant neoplasm of dome of bladder: Secondary | ICD-10-CM

## 2021-07-30 DIAGNOSIS — R69 Illness, unspecified: Secondary | ICD-10-CM | POA: Diagnosis not present

## 2021-07-30 DIAGNOSIS — E559 Vitamin D deficiency, unspecified: Secondary | ICD-10-CM

## 2021-07-30 DIAGNOSIS — R0981 Nasal congestion: Secondary | ICD-10-CM | POA: Diagnosis not present

## 2021-07-30 LAB — VITAMIN D 25 HYDROXY (VIT D DEFICIENCY, FRACTURES): VITD: 21.74 ng/mL — ABNORMAL LOW (ref 30.00–100.00)

## 2021-07-30 LAB — POC COVID19 BINAXNOW

## 2021-07-30 MED ORDER — CITALOPRAM HYDROBROMIDE 10 MG PO TABS: 10.0000 mg | ORAL_TABLET | Freq: Every day | ORAL | 0 refills | Status: AC

## 2021-07-30 NOTE — Assessment & Plan Note (Signed)
Check follow up level.

## 2021-07-30 NOTE — Chronic Care Management (AMB) (Signed)
°  Care Management   Note  07/30/2021 Name: Donna Curry MRN: [id number] DOB: 28-Feb-1956  Donna Curry is a 66 y.o. year old female who is a primary care patient of [name], NP and is actively engaged with the care management team. I reached out to Rayburn Go by phone today to assist with re-scheduling a follow up visit with the RN Case Manager  Follow up plan: Unsuccessful telephone outreach attempt made. A HIPAA compliant phone message was left for the patient providing contact information and requesting a return call.   Julian Hy, [place] Management  Direct Dial: [phone]

## 2021-07-30 NOTE — Assessment & Plan Note (Signed)
Flu shot today 

## 2021-07-30 NOTE — Assessment & Plan Note (Addendum)
Wt Readings from Last 3 Encounters:  07/30/21 74 lb 3.2 oz (33.7 kg)  07/20/21 79 lb (35.8 kg)  04/26/21 76 lb 12.8 oz (34.8 kg)   She reports that she continues to eat frequently.  This is likely be complicated by her COPD as well as her CA/chemotherapy.

## 2021-07-30 NOTE — Patient Instructions (Signed)
Please complete lab work prior to leaving.   

## 2021-07-30 NOTE — Assessment & Plan Note (Signed)
Followed by oncology.  She reports feeling well. Treatment plan per oncology as follows: Pembrolizumab 400 mg IV q 2 months -- s/p cycle #20 -- changed to q 2 month intervals on 11/10/2020 IV iron as indicated -Feraheme given on 02/15/2021

## 2021-07-30 NOTE — Progress Notes (Signed)
Subjective:     Patient ID: Donna Curry, female    DOB: 26-Feb-1956, 66 y.o.   MRN: [id number]  Chief Complaint  Patient presents with   Hypertension    Here for follow up    HPI Patient is in today for follow up.  HTN- maintained on citalopram 10mg  once daily.  BP Readings from Last 3 Encounters:  07/30/21 137/77  07/20/21 (!) 149/78  04/26/21 (!) 145/80   Anxiety/depression- reports symptoms stable on citalopram 10mg  but admits to not taking every day.   She has not received any covid vaccines or the flu shot. States she is scared of shots. I advised her that with her health conditions flu or covid could be life threatening for her and she agrees to receive flu shot today here in clinic and then will Curry downstairs to the pharmacy for covid vaccine.   Health Maintenance Due  Topic Date Due   COVID-19 Vaccine (1) Never done   Pneumonia Vaccine 2+ Years old (1 - PCV) Never done   Hepatitis C Screening  Never done   Zoster Vaccines- Shingrix (1 of 2) Never done   PAP SMEAR-Modifier  Never done   COLONOSCOPY (Pts 45-83yrs Insurance coverage will need to be confirmed)  Never done   MAMMOGRAM  11/15/2019   DEXA SCAN  Never done    Past Medical History:  Diagnosis Date   Anemia    Asthma    Dyspnea    with exertion    Family history of adverse reaction to anesthesia    brother wakes up and dont know who he is and sister has n/v   Goals of care, counseling/discussion 02/09/2018   History of blood transfusion    Hypertension    Iron deficiency anemia due to chronic blood loss 03/01/2018   Measles as a child   Mumps as a child   TIA (transient ischemic attack)    Tobacco abuse     Past Surgical History:  Procedure Laterality Date   CYSTOSCOPY W/ URETERAL STENT PLACEMENT Right 12/13/2017   Procedure: CYSTOSCOPY WITH RIGHT RETROGRADE PYELOGRAM/URETERAL RIGHT STENT PLACEMENT;  Surgeon: [name], MD;  Location: [facility];  Service: Urology;  Laterality: Right;    IR IMAGING GUIDED PORT INSERTION  02/23/2018   TRANSURETHRAL RESECTION OF BLADDER TUMOR N/A 12/13/2017   Procedure: TRANSURETHRAL RESECTION OF BLADDER TUMOR (TURBT);  Surgeon: [name], MD;  Location: [facility];  Service: Urology;  Laterality: N/A;   TRANSURETHRAL RESECTION OF BLADDER TUMOR N/A 10/03/2018   Procedure: TRANSURETHRAL RESECTION OF BLADDER TUMOR (TURBT);  Surgeon: [name], MD;  Location: [facility];  Service: Urology;  Laterality: N/A;   TRANSURETHRAL RESECTION OF BLADDER TUMOR WITH GYRUS (TURBT-GYRUS)     [name] 12-13-17   tubes tied     uterine ablation     about 2005    Family History  Problem Relation Age of Onset   Hypertension Mother    Diabetes Father    Hypertension Brother    HIV Brother     Social History   Socioeconomic History   Marital status: Single    Spouse name: Not on file   Number of children: Not on file   Years of education: Not on file   Highest education level: Not on file  Occupational History   Not on file  Tobacco Use   Smoking status: Every Day    Packs/day: 0.25    Types: Cigarettes  Smokeless tobacco: Never   Tobacco comments:    smoking 3 cigarettes a day  Vaping Use   Vaping Use: Former  Substance and Sexual Activity   Alcohol use: No    Alcohol/week: 0.0 standard drinks   Drug use: Never   Sexual activity: Not Currently  Other Topics Concern   Not on file  Social History Narrative   Denies hx of drug useSingle1 daughter age 18 lives with daughter and grandson who is 65.Works as a Sports coach for [employer].Completed 12th grade.      01/22/21 states she and her sister live together.    Social Determinants of Health   Financial Resource Strain: Not on file  Food Insecurity: Not on file  Transportation Needs: Not on file  Physical Activity: Not on file  Stress: Not on file  Social Connections: Not on file  Intimate Partner Violence: Not on file    Outpatient Medications Prior to Visit   Medication Sig Dispense Refill   acetaminophen (TYLENOL) 500 MG tablet Take 1,000 mg by mouth every 8 (eight) hours as needed for moderate pain.     albuterol (PROVENTIL) (2.5 MG/3ML) 0.083% nebulizer solution Take 3 mLs (2.5 mg total) by nebulization every 6 (six) hours as needed for wheezing or shortness of breath. Use 3 times daily x 4 days then every 6 hours as needed. 75 mL 0   amitriptyline (ELAVIL) 10 MG tablet TAKE 1 TABLET BY MOUTH AT BEDTIME AS NEEDED FOR SLEEP 90 tablet 0   amLODipine (NORVASC) 10 MG tablet Take 1 tablet by mouth once daily 90 tablet 0   aspirin EC 81 MG tablet Take 81 mg by mouth daily.     benzonatate (TESSALON) 100 MG capsule Take 1 capsule (100 mg total) by mouth 2 (two) times daily as needed for cough. 30 capsule 0   diazepam (VALIUM) 5 MG tablet Take 1 tablet (5 mg total) by mouth every 8 (eight) hours as needed for anxiety. 40 tablet 0   feeding supplement (ENSURE ENLIVE / ENSURE PLUS) LIQD Take 237 mLs by mouth 2 (two) times daily between meals. 237 mL 12   Fluticasone-Umeclidin-Vilant (TRELEGY ELLIPTA) 100-62.5-25 MCG/ACT AEPB Inhale 1 puff into the lungs every 12 (twelve) hours as needed. 2 each 3   loratadine (CLARITIN) 10 MG tablet Take 1 tablet (10 mg total) by mouth daily. 20 tablet 0   ondansetron (ZOFRAN) 8 MG tablet Take 1 tablet (8 mg total) by mouth every 8 (eight) hours as needed for nausea or vomiting. 20 tablet 0   oxyCODONE (OXY IR/ROXICODONE) 5 MG immediate release tablet Take 1 tablet (5 mg total) by mouth every 6 (six) hours as needed for up to 30 doses for severe pain. 30 tablet 0   pantoprazole (PROTONIX) 40 MG tablet Take 1 tablet (40 mg total) by mouth daily at 6 (six) AM. 30 tablet 0   prochlorperazine (COMPAZINE) 10 MG tablet Take 1 tablet (10 mg total) by mouth every 6 (six) hours as needed for nausea or vomiting. 30 tablet 0   citalopram (CELEXA) 10 MG tablet Take 1 tablet (10 mg total) by mouth daily. 30 tablet 0    Fluticasone-Umeclidin-Vilant (TRELEGY ELLIPTA) 100-62.5-25 MCG/INH AEPB Inhale 1 puff into the lungs in the morning and at bedtime. 60 each 0   Vitamin D, Ergocalciferol, (DRISDOL) 1.25 MG (50000 UNIT) CAPS capsule Take 1 capsule (50,000 Units total) by mouth once a week. 4 capsule 0   No facility-administered medications prior to visit.  No Known Allergies  ROS    See HPI Objective:    Physical Exam Constitutional:      General: She is not in acute distress.    Appearance: Normal appearance. She is well-developed. She is cachectic.  HENT:     Head: Normocephalic and atraumatic.     Right Ear: External ear normal.     Left Ear: External ear normal.  Eyes:     General: No scleral icterus. Neck:     Thyroid: No thyromegaly.  Cardiovascular:     Rate and Rhythm: Normal rate and regular rhythm.     Heart sounds: Normal heart sounds. No murmur heard. Pulmonary:     Effort: Pulmonary effort is normal. No respiratory distress.     Breath sounds: Rhonchi present. No wheezing.     Comments: Expiratory rhonchi Musculoskeletal:     Cervical back: Neck supple.  Skin:    General: Skin is warm and dry.  Neurological:     Mental Status: She is alert and oriented to person, place, and time.  Psychiatric:        Mood and Affect: Mood normal.        Behavior: Behavior normal.        Thought Content: Thought content normal.        Judgment: Judgment normal.    BP 137/77 (BP Location: Right Arm, Patient Position: Sitting, Cuff Size: Small)    Pulse 93    Temp 98.4 F (36.9 C) (Oral)    Resp 16    Wt 74 lb 3.2 oz (33.7 kg)    SpO2 100%    BMI 13.57 kg/m  Wt Readings from Last 3 Encounters:  07/30/21 74 lb 3.2 oz (33.7 kg)  07/20/21 79 lb (35.8 kg)  04/26/21 76 lb 12.8 oz (34.8 kg)       Assessment & Plan:   Problem List Items Addressed This Visit       Unprioritized   Vitamin D deficiency - Primary    Check follow up level.        Relevant Orders   Vitamin D (25  hydroxy)   Vaccine counseling    Flu shot today.       Protein-calorie malnutrition, severe    Wt Readings from Last 3 Encounters:  07/30/21 74 lb 3.2 oz (33.7 kg)  07/20/21 79 lb (35.8 kg)  04/26/21 76 lb 12.8 oz (34.8 kg)  She reports that she continues to eat frequently.  This is likely be complicated by her COPD as well as her CA/chemotherapy.      Essential hypertension    BP stable. Continue amlodipine 10mg  once daily.       Depression with anxiety    Stable. Encouraged her to try not to miss doses of her citalopram.       Relevant Medications   citalopram (CELEXA) 10 MG tablet   Bladder cancer ([place])    Followed by oncology.  She reports feeling well. Treatment plan per oncology as follows: Pembrolizumab 400 mg IV q 2 months -- s/p cycle #20 -- changed to q 2 month intervals on 11/10/2020 IV iron as indicated -Feraheme given on 02/15/2021      Other Visit Diagnoses     Nasal congestion       Relevant Orders   POC COVID-19 (Completed)   Needs flu shot       Relevant Orders   Flu Vaccine QUAD High Dose(Fluad) (Completed)       I have  discontinued Eduarda F. Cavenaugh's Vitamin D (Ergocalciferol). I am also having her maintain her acetaminophen, aspirin EC, feeding supplement, loratadine, albuterol, pantoprazole, amitriptyline, Trelegy Ellipta, amLODipine, diazepam, prochlorperazine, ondansetron, oxyCODONE, benzonatate, and citalopram.  Meds ordered this encounter  Medications   citalopram (CELEXA) 10 MG tablet    Sig: Take 1 tablet (10 mg total) by mouth daily.    Dispense:  90 tablet    Refill:  0    Order Specific Question:   Supervising Provider    Answer:   Penni Homans A [[id number]]

## 2021-07-30 NOTE — Assessment & Plan Note (Signed)
Stable. Encouraged her to try not to miss doses of her citalopram.

## 2021-07-30 NOTE — Assessment & Plan Note (Signed)
BP stable. Continue amlodipine 10mg  once daily.

## 2021-08-01 ENCOUNTER — Telehealth: Payer: Self-pay | Admitting: Family

## 2021-08-01 DIAGNOSIS — E559 Vitamin D deficiency, unspecified: Secondary | ICD-10-CM

## 2021-08-01 MED ORDER — VITAMIN D3 75 MCG (3000 UT) PO TABS: 1.0000 | ORAL_TABLET | Freq: Every day | ORAL | Status: AC

## 2021-08-01 NOTE — Telephone Encounter (Signed)
Vit d is low. I would like her to add otc vit D 3000 iu once daily.

## 2021-08-02 NOTE — Telephone Encounter (Signed)
Lvm  for patient to call back about results. 

## 2021-08-05 DIAGNOSIS — R69 Illness, unspecified: Secondary | ICD-10-CM | POA: Diagnosis not present

## 2021-08-06 NOTE — Telephone Encounter (Signed)
Letter  with the provider's message mailed to address listed in patient's records.

## 2021-08-06 NOTE — Telephone Encounter (Signed)
Please mail letter re: vitamin D.

## 2021-08-17 ENCOUNTER — Other Ambulatory Visit: Payer: Self-pay

## 2021-08-17 DIAGNOSIS — C672 Malignant neoplasm of lateral wall of bladder: Secondary | ICD-10-CM

## 2021-08-17 MED ORDER — OXYCODONE HCL 5 MG PO TABS: 5.0000 mg | ORAL_TABLET | Freq: Four times a day (QID) | ORAL | 0 refills | Status: AC | PRN

## 2021-08-19 DIAGNOSIS — R69 Illness, unspecified: Secondary | ICD-10-CM | POA: Diagnosis not present

## 2021-08-20 ENCOUNTER — Ambulatory Visit (HOSPITAL_COMMUNITY): Payer: Medicare Other

## 2021-08-27 ENCOUNTER — Ambulatory Visit (HOSPITAL_COMMUNITY)
Admission: RE | Admit: 2021-08-27 | Discharge: 2021-08-27 | Disposition: A | Discharge status: Home / Self Care | Payer: Medicare Other | Source: Ambulatory Visit | Attending: Hematology & Oncology | Admitting: Hematology & Oncology

## 2021-08-27 ENCOUNTER — Other Ambulatory Visit: Payer: Self-pay

## 2021-08-27 DIAGNOSIS — J479 Bronchiectasis, uncomplicated: Secondary | ICD-10-CM | POA: Insufficient documentation

## 2021-08-27 DIAGNOSIS — J439 Emphysema, unspecified: Secondary | ICD-10-CM | POA: Diagnosis not present

## 2021-08-27 DIAGNOSIS — C679 Malignant neoplasm of bladder, unspecified: Secondary | ICD-10-CM | POA: Diagnosis not present

## 2021-08-27 DIAGNOSIS — I700 Atherosclerosis of aorta: Secondary | ICD-10-CM | POA: Diagnosis not present

## 2021-08-27 DIAGNOSIS — C670 Malignant neoplasm of trigone of bladder: Secondary | ICD-10-CM | POA: Diagnosis not present

## 2021-08-27 DIAGNOSIS — I2510 Atherosclerotic heart disease of native coronary artery without angina pectoris: Secondary | ICD-10-CM | POA: Diagnosis not present

## 2021-08-27 LAB — GLUCOSE, CAPILLARY: Glucose-Capillary: 92 mg/dL (ref 70–99)

## 2021-08-27 MED ORDER — FLUDEOXYGLUCOSE F - 18 (FDG) INJECTION
5.0000 | Freq: Once | INTRAVENOUS | Status: AC
  Administered 2021-08-27: 5 via INTRAVENOUS

## 2021-08-28 DIAGNOSIS — J449 Chronic obstructive pulmonary disease, unspecified: Secondary | ICD-10-CM | POA: Diagnosis not present

## 2021-08-30 ENCOUNTER — Telehealth: Payer: Self-pay | Admitting: *Deleted

## 2021-08-30 NOTE — Telephone Encounter (Signed)
-----   Message from [name], MD sent at 08/30/2021  2:06 PM EST ----- Call - NO cancer on the PET scan!!!  Stop smoking!!!  Laurey Arrow

## 2021-08-30 NOTE — Telephone Encounter (Signed)
As noted below by [name], I informed the patient that there is NO cancer on the PET scan. She verbalized understanding.

## 2021-08-31 NOTE — Chronic Care Management (AMB) (Signed)
°  Care Management   Note  08/31/2021 Name: [name] MRN: [id number] DOB: [birth year]  Donna Curry is a 66 y.o. year old female who is a primary care patient of [name], NP and is actively engaged with the care management team. I reached out to Rayburn Go by phone today to assist with re-scheduling a follow up visit with the RN Case Manager  Follow up plan: 2nd Unsuccessful telephone outreach attempt made. A HIPAA compliant phone message was left for the patient providing contact information and requesting a return call.   Julian Hy, [place] Management  Direct Dial: [phone]

## 2021-09-02 ENCOUNTER — Inpatient Hospital Stay (HOSPITAL_COMMUNITY)
Admission: EM | Admit: 2021-09-02 | Discharge: 2021-09-22 | DRG: 207 | Disposition: E | Payer: Medicare Other | Attending: Pulmonary Disease | Admitting: Pulmonary Disease

## 2021-09-02 ENCOUNTER — Inpatient Hospital Stay (HOSPITAL_COMMUNITY): Payer: Medicare Other

## 2021-09-02 ENCOUNTER — Emergency Department (HOSPITAL_COMMUNITY): Payer: Medicare Other

## 2021-09-02 DIAGNOSIS — N3289 Other specified disorders of bladder: Secondary | ICD-10-CM | POA: Diagnosis not present

## 2021-09-02 DIAGNOSIS — R34 Anuria and oliguria: Secondary | ICD-10-CM | POA: Diagnosis not present

## 2021-09-02 DIAGNOSIS — J432 Centrilobular emphysema: Secondary | ICD-10-CM | POA: Diagnosis present

## 2021-09-02 DIAGNOSIS — R0609 Other forms of dyspnea: Secondary | ICD-10-CM | POA: Diagnosis not present

## 2021-09-02 DIAGNOSIS — Z79899 Other long term (current) drug therapy: Secondary | ICD-10-CM

## 2021-09-02 DIAGNOSIS — N1330 Unspecified hydronephrosis: Secondary | ICD-10-CM | POA: Diagnosis present

## 2021-09-02 DIAGNOSIS — I700 Atherosclerosis of aorta: Secondary | ICD-10-CM | POA: Diagnosis not present

## 2021-09-02 DIAGNOSIS — R54 Age-related physical debility: Secondary | ICD-10-CM | POA: Diagnosis present

## 2021-09-02 DIAGNOSIS — E43 Unspecified severe protein-calorie malnutrition: Secondary | ICD-10-CM | POA: Diagnosis present

## 2021-09-02 DIAGNOSIS — Z20822 Contact with and (suspected) exposure to covid-19: Secondary | ICD-10-CM | POA: Diagnosis present

## 2021-09-02 DIAGNOSIS — J441 Chronic obstructive pulmonary disease with (acute) exacerbation: Secondary | ICD-10-CM

## 2021-09-02 DIAGNOSIS — Z681 Body mass index (BMI) 19 or less, adult: Secondary | ICD-10-CM | POA: Diagnosis not present

## 2021-09-02 DIAGNOSIS — F419 Anxiety disorder, unspecified: Secondary | ICD-10-CM | POA: Diagnosis present

## 2021-09-02 DIAGNOSIS — R6521 Severe sepsis with septic shock: Secondary | ICD-10-CM | POA: Diagnosis not present

## 2021-09-02 DIAGNOSIS — R4182 Altered mental status, unspecified: Secondary | ICD-10-CM

## 2021-09-02 DIAGNOSIS — F32A Depression, unspecified: Secondary | ICD-10-CM | POA: Diagnosis not present

## 2021-09-02 DIAGNOSIS — R6889 Other general symptoms and signs: Secondary | ICD-10-CM | POA: Diagnosis not present

## 2021-09-02 DIAGNOSIS — R0689 Other abnormalities of breathing: Secondary | ICD-10-CM | POA: Diagnosis not present

## 2021-09-02 DIAGNOSIS — J439 Emphysema, unspecified: Secondary | ICD-10-CM | POA: Diagnosis not present

## 2021-09-02 DIAGNOSIS — J90 Pleural effusion, not elsewhere classified: Secondary | ICD-10-CM | POA: Diagnosis not present

## 2021-09-02 DIAGNOSIS — L89152 Pressure ulcer of sacral region, stage 2: Secondary | ICD-10-CM | POA: Diagnosis not present

## 2021-09-02 DIAGNOSIS — Z7189 Other specified counseling: Secondary | ICD-10-CM | POA: Diagnosis not present

## 2021-09-02 DIAGNOSIS — Z7982 Long term (current) use of aspirin: Secondary | ICD-10-CM

## 2021-09-02 DIAGNOSIS — R0902 Hypoxemia: Secondary | ICD-10-CM | POA: Diagnosis not present

## 2021-09-02 DIAGNOSIS — G9341 Metabolic encephalopathy: Secondary | ICD-10-CM | POA: Diagnosis present

## 2021-09-02 DIAGNOSIS — J9621 Acute and chronic respiratory failure with hypoxia: Secondary | ICD-10-CM | POA: Diagnosis not present

## 2021-09-02 DIAGNOSIS — Z87891 Personal history of nicotine dependence: Secondary | ICD-10-CM | POA: Diagnosis not present

## 2021-09-02 DIAGNOSIS — Z9981 Dependence on supplemental oxygen: Secondary | ICD-10-CM

## 2021-09-02 DIAGNOSIS — J9622 Acute and chronic respiratory failure with hypercapnia: Secondary | ICD-10-CM | POA: Diagnosis present

## 2021-09-02 DIAGNOSIS — Z8249 Family history of ischemic heart disease and other diseases of the circulatory system: Secondary | ICD-10-CM

## 2021-09-02 DIAGNOSIS — N170 Acute kidney failure with tubular necrosis: Secondary | ICD-10-CM | POA: Diagnosis not present

## 2021-09-02 DIAGNOSIS — D638 Anemia in other chronic diseases classified elsewhere: Secondary | ICD-10-CM | POA: Diagnosis present

## 2021-09-02 DIAGNOSIS — A419 Sepsis, unspecified organism: Secondary | ICD-10-CM | POA: Diagnosis not present

## 2021-09-02 DIAGNOSIS — D509 Iron deficiency anemia, unspecified: Secondary | ICD-10-CM | POA: Diagnosis not present

## 2021-09-02 DIAGNOSIS — E874 Mixed disorder of acid-base balance: Secondary | ICD-10-CM | POA: Diagnosis present

## 2021-09-02 DIAGNOSIS — N179 Acute kidney failure, unspecified: Secondary | ICD-10-CM | POA: Diagnosis not present

## 2021-09-02 DIAGNOSIS — J9601 Acute respiratory failure with hypoxia: Secondary | ICD-10-CM | POA: Diagnosis present

## 2021-09-02 DIAGNOSIS — I10 Essential (primary) hypertension: Secondary | ICD-10-CM | POA: Diagnosis present

## 2021-09-02 DIAGNOSIS — D6489 Other specified anemias: Secondary | ICD-10-CM | POA: Diagnosis present

## 2021-09-02 DIAGNOSIS — Z743 Need for continuous supervision: Secondary | ICD-10-CM | POA: Diagnosis not present

## 2021-09-02 DIAGNOSIS — I499 Cardiac arrhythmia, unspecified: Secondary | ICD-10-CM | POA: Diagnosis not present

## 2021-09-02 DIAGNOSIS — Z8551 Personal history of malignant neoplasm of bladder: Secondary | ICD-10-CM

## 2021-09-02 DIAGNOSIS — Z515 Encounter for palliative care: Secondary | ICD-10-CM | POA: Diagnosis not present

## 2021-09-02 DIAGNOSIS — R509 Fever, unspecified: Secondary | ICD-10-CM | POA: Diagnosis not present

## 2021-09-02 DIAGNOSIS — Z8673 Personal history of transient ischemic attack (TIA), and cerebral infarction without residual deficits: Secondary | ICD-10-CM

## 2021-09-02 DIAGNOSIS — L8990 Pressure ulcer of unspecified site, unspecified stage: Secondary | ICD-10-CM | POA: Insufficient documentation

## 2021-09-02 DIAGNOSIS — R918 Other nonspecific abnormal finding of lung field: Secondary | ICD-10-CM | POA: Diagnosis not present

## 2021-09-02 DIAGNOSIS — J189 Pneumonia, unspecified organism: Secondary | ICD-10-CM | POA: Diagnosis present

## 2021-09-02 DIAGNOSIS — E870 Hyperosmolality and hypernatremia: Secondary | ICD-10-CM | POA: Diagnosis not present

## 2021-09-02 DIAGNOSIS — Z4659 Encounter for fitting and adjustment of other gastrointestinal appliance and device: Secondary | ICD-10-CM

## 2021-09-02 DIAGNOSIS — F17210 Nicotine dependence, cigarettes, uncomplicated: Secondary | ICD-10-CM | POA: Diagnosis present

## 2021-09-02 DIAGNOSIS — K7200 Acute and subacute hepatic failure without coma: Secondary | ICD-10-CM | POA: Diagnosis not present

## 2021-09-02 DIAGNOSIS — Z66 Do not resuscitate: Secondary | ICD-10-CM | POA: Diagnosis not present

## 2021-09-02 DIAGNOSIS — R0602 Shortness of breath: Secondary | ICD-10-CM | POA: Diagnosis not present

## 2021-09-02 DIAGNOSIS — R64 Cachexia: Secondary | ICD-10-CM | POA: Diagnosis not present

## 2021-09-02 DIAGNOSIS — C679 Malignant neoplasm of bladder, unspecified: Secondary | ICD-10-CM | POA: Diagnosis not present

## 2021-09-02 DIAGNOSIS — Z978 Presence of other specified devices: Secondary | ICD-10-CM

## 2021-09-02 DIAGNOSIS — R0600 Dyspnea, unspecified: Secondary | ICD-10-CM | POA: Diagnosis not present

## 2021-09-02 DIAGNOSIS — Z7951 Long term (current) use of inhaled steroids: Secondary | ICD-10-CM

## 2021-09-02 DIAGNOSIS — E875 Hyperkalemia: Secondary | ICD-10-CM | POA: Diagnosis not present

## 2021-09-02 DIAGNOSIS — Z833 Family history of diabetes mellitus: Secondary | ICD-10-CM

## 2021-09-02 DIAGNOSIS — Z4682 Encounter for fitting and adjustment of non-vascular catheter: Secondary | ICD-10-CM | POA: Diagnosis not present

## 2021-09-02 DIAGNOSIS — J9811 Atelectasis: Secondary | ICD-10-CM | POA: Diagnosis not present

## 2021-09-02 DIAGNOSIS — Z9221 Personal history of antineoplastic chemotherapy: Secondary | ICD-10-CM

## 2021-09-02 LAB — RESPIRATORY PANEL BY PCR

## 2021-09-02 LAB — COMPREHENSIVE METABOLIC PANEL
ALT: 17 U/L (ref 0–44)
AST: 28 U/L (ref 15–41)
Albumin: 3.4 g/dL — ABNORMAL LOW (ref 3.5–5.0)
Alkaline Phosphatase: 89 U/L (ref 38–126)
Anion gap: 19 — ABNORMAL HIGH (ref 5–15)
BUN: 26 mg/dL — ABNORMAL HIGH (ref 8–23)
CO2: 24 mmol/L (ref 22–32)
Calcium: 9.7 mg/dL (ref 8.9–10.3)
Chloride: 97 mmol/L — ABNORMAL LOW (ref 98–111)
Creatinine, Ser: 1.1 mg/dL — ABNORMAL HIGH (ref 0.44–1.00)
GFR, Estimated: 56 mL/min — ABNORMAL LOW (ref >60)
Glucose, Bld: 150 mg/dL — ABNORMAL HIGH (ref 70–99)
Potassium: 4.5 mmol/L (ref 3.5–5.1)
Sodium: 140 mmol/L (ref 135–145)
Total Bilirubin: 1.4 mg/dL — ABNORMAL HIGH (ref 0.3–1.2)
Total Protein: 7.8 g/dL (ref 6.5–8.1)

## 2021-09-02 LAB — MRSA NEXT GEN BY PCR, NASAL: MRSA by PCR Next Gen: NOT DETECTED

## 2021-09-02 LAB — CBC WITH DIFFERENTIAL/PLATELET
Abs Immature Granulocytes: 0.03 10*3/uL (ref 0.00–0.07)
Basophils Absolute: 0 10*3/uL (ref 0.0–0.1)
Basophils Relative: 0 %
Eosinophils Absolute: 0 10*3/uL (ref 0.0–0.5)
Eosinophils Relative: 0 %
HCT: 41.9 % (ref 36.0–46.0)
Hemoglobin: 13.3 g/dL (ref 12.0–15.0)
Immature Granulocytes: 0 %
Lymphocytes Relative: 8 %
Lymphs Abs: 0.8 10*3/uL (ref 0.7–4.0)
MCH: 31.4 pg (ref 26.0–34.0)
MCHC: 31.7 g/dL (ref 30.0–36.0)
MCV: 99.1 fL (ref 80.0–100.0)
Monocytes Absolute: 1 10*3/uL (ref 0.1–1.0)
Monocytes Relative: 10 %
Neutro Abs: 7.8 10*3/uL — ABNORMAL HIGH (ref 1.7–7.7)
Neutrophils Relative %: 82 %
Platelets: 207 10*3/uL (ref 150–400)
RBC: 4.23 MIL/uL (ref 3.87–5.11)
RDW: 16.6 % — ABNORMAL HIGH (ref 11.5–15.5)
WBC: 9.5 10*3/uL (ref 4.0–10.5)
nRBC: 0 % (ref 0.0–0.2)

## 2021-09-02 LAB — I-STAT ARTERIAL BLOOD GAS, ED
Acid-Base Excess: 4 mmol/L — ABNORMAL HIGH (ref 0.0–2.0)
Bicarbonate: 32.8 mmol/L — ABNORMAL HIGH (ref 20.0–28.0)
Calcium, Ion: 1.22 mmol/L (ref 1.15–1.40)
HCT: 37 % (ref 36.0–46.0)
Hemoglobin: 12.6 g/dL (ref 12.0–15.0)
O2 Saturation: 100 %
Patient temperature: 98
Potassium: 3.2 mmol/L — ABNORMAL LOW (ref 3.5–5.1)
Sodium: 139 mmol/L (ref 135–145)
TCO2: 35 mmol/L — ABNORMAL HIGH (ref 22–32)
pCO2 arterial: 69.5 mmHg (ref 32.0–48.0)
pH, Arterial: 7.28 — ABNORMAL LOW (ref 7.350–7.450)
pO2, Arterial: 527 mmHg — ABNORMAL HIGH (ref 83.0–108.0)

## 2021-09-02 LAB — HEMOGLOBIN A1C
Hgb A1c MFr Bld: 5.3 % (ref 4.8–5.6)
Mean Plasma Glucose: 105.41 mg/dL

## 2021-09-02 LAB — MAGNESIUM: Magnesium: 3.7 mg/dL — ABNORMAL HIGH (ref 1.7–2.4)

## 2021-09-02 LAB — GLUCOSE, CAPILLARY
Glucose-Capillary: 106 mg/dL — ABNORMAL HIGH (ref 70–99)
Glucose-Capillary: 120 mg/dL — ABNORMAL HIGH (ref 70–99)
Glucose-Capillary: 153 mg/dL — ABNORMAL HIGH (ref 70–99)

## 2021-09-02 LAB — TROPONIN I (HIGH SENSITIVITY)
Troponin I (High Sensitivity): 31 ng/L — ABNORMAL HIGH (ref <18)
Troponin I (High Sensitivity): 29 ng/L — ABNORMAL HIGH (ref <18)

## 2021-09-02 LAB — RESP PANEL BY RT-PCR (FLU A&B, COVID) ARPGX2
Influenza A by PCR: NEGATIVE
Influenza B by PCR: NEGATIVE
SARS Coronavirus 2 by RT PCR: NEGATIVE

## 2021-09-02 LAB — BRAIN NATRIURETIC PEPTIDE: B Natriuretic Peptide: 110.1 pg/mL — ABNORMAL HIGH (ref 0.0–100.0)

## 2021-09-02 LAB — PROCALCITONIN: Procalcitonin: 16.12 ng/mL

## 2021-09-02 MED ORDER — BUDESONIDE 0.25 MG/2ML IN SUSP
0.2500 mg | Freq: Two times a day (BID) | RESPIRATORY_TRACT | Status: DC
  Administered 2021-09-02 – 2021-09-17 (×30): 0.25 mg via RESPIRATORY_TRACT
  Filled 2021-09-02 (×30): qty 2

## 2021-09-02 MED ORDER — IOHEXOL 350 MG/ML SOLN
50.0000 mL | Freq: Once | INTRAVENOUS | Status: AC | PRN
Start: 2021-09-02 — End: 2021-09-02
  Administered 2021-09-02: 50 mL via INTRAVENOUS

## 2021-09-02 MED ORDER — ONDANSETRON HCL 4 MG/2ML IJ SOLN: 4.0000 mg | Freq: Four times a day (QID) | INTRAMUSCULAR | Status: DC | PRN

## 2021-09-02 MED ORDER — DEXMEDETOMIDINE HCL IN NACL 400 MCG/100ML IV SOLN: 0.0000 ug/kg/h | INTRAVENOUS | Status: DC

## 2021-09-02 MED ORDER — ETOMIDATE 2 MG/ML IV SOLN
INTRAVENOUS | Status: DC | PRN
  Administered 2021-09-02: 15 mg via INTRAVENOUS

## 2021-09-02 MED ORDER — ENOXAPARIN SODIUM 300 MG/3ML IJ SOLN
20.0000 mg | INTRAMUSCULAR | Status: DC
  Administered 2021-09-02 – 2021-09-04 (×3): 20 mg via SUBCUTANEOUS
  Filled 2021-09-02 (×5): qty 0.2

## 2021-09-02 MED ORDER — LACTATED RINGERS IV SOLN: INTRAVENOUS | Status: DC

## 2021-09-02 MED ORDER — DEXMEDETOMIDINE HCL IN NACL 400 MCG/100ML IV SOLN
0.0000 ug/kg/h | INTRAVENOUS | Status: DC
  Filled 2021-09-02: qty 100

## 2021-09-02 MED ORDER — PROPOFOL 1000 MG/100ML IV EMUL
5.0000 ug/kg/min | INTRAVENOUS | Status: DC
  Administered 2021-09-02: 40 ug/kg/min via INTRAVENOUS
  Administered 2021-09-02: 5 ug/kg/min via INTRAVENOUS
  Administered 2021-09-03 – 2021-09-04 (×3): 40 ug/kg/min via INTRAVENOUS
  Filled 2021-09-02 (×4): qty 100

## 2021-09-02 MED ORDER — CHLORHEXIDINE GLUCONATE 0.12% ORAL RINSE (MEDLINE KIT)
15.0000 mL | Freq: Two times a day (BID) | OROMUCOSAL | Status: DC
  Administered 2021-09-02 – 2021-09-17 (×30): 15 mL via OROMUCOSAL

## 2021-09-02 MED ORDER — INSULIN ASPART 100 UNIT/ML IJ SOLN
0.0000 [IU] | INTRAMUSCULAR | Status: DC
  Administered 2021-09-02 – 2021-09-03 (×2): 2 [IU] via SUBCUTANEOUS
  Administered 2021-09-03 (×2): 1 [IU] via SUBCUTANEOUS
  Administered 2021-09-04: 2 [IU] via SUBCUTANEOUS
  Administered 2021-09-04: 4 [IU] via SUBCUTANEOUS
  Administered 2021-09-04: 2 [IU] via SUBCUTANEOUS
  Administered 2021-09-04: 1 [IU] via SUBCUTANEOUS
  Administered 2021-09-04 – 2021-09-05 (×2): 2 [IU] via SUBCUTANEOUS
  Administered 2021-09-05 – 2021-09-06 (×5): 1 [IU] via SUBCUTANEOUS
  Administered 2021-09-06: 2 [IU] via SUBCUTANEOUS
  Administered 2021-09-06: 1 [IU] via SUBCUTANEOUS
  Administered 2021-09-06: 2 [IU] via SUBCUTANEOUS
  Administered 2021-09-07 (×5): 1 [IU] via SUBCUTANEOUS
  Administered 2021-09-07: 2 [IU] via SUBCUTANEOUS
  Administered 2021-09-08: 5 [IU] via SUBCUTANEOUS
  Administered 2021-09-08: 1 [IU] via SUBCUTANEOUS
  Administered 2021-09-08: 2 [IU] via SUBCUTANEOUS
  Administered 2021-09-08: 1 [IU] via SUBCUTANEOUS
  Administered 2021-09-08: 2 [IU] via SUBCUTANEOUS
  Administered 2021-09-09 – 2021-09-10 (×3): 1 [IU] via SUBCUTANEOUS
  Administered 2021-09-10 (×2): 2 [IU] via SUBCUTANEOUS
  Administered 2021-09-10 – 2021-09-11 (×2): 1 [IU] via SUBCUTANEOUS
  Administered 2021-09-11: 2 [IU] via SUBCUTANEOUS
  Administered 2021-09-11 – 2021-09-12 (×2): 1 [IU] via SUBCUTANEOUS
  Administered 2021-09-12 (×2): 2 [IU] via SUBCUTANEOUS
  Administered 2021-09-12 – 2021-09-13 (×2): 1 [IU] via SUBCUTANEOUS
  Administered 2021-09-13: 2 [IU] via SUBCUTANEOUS
  Administered 2021-09-13 – 2021-09-14 (×3): 1 [IU] via SUBCUTANEOUS
  Administered 2021-09-14: 2 [IU] via SUBCUTANEOUS
  Administered 2021-09-14 – 2021-09-15 (×3): 1 [IU] via SUBCUTANEOUS
  Administered 2021-09-15: 2 [IU] via SUBCUTANEOUS
  Administered 2021-09-15 – 2021-09-16 (×5): 1 [IU] via SUBCUTANEOUS
  Administered 2021-09-16: 2 [IU] via SUBCUTANEOUS

## 2021-09-02 MED ORDER — IPRATROPIUM-ALBUTEROL 0.5-2.5 (3) MG/3ML IN SOLN: 3.0000 mL | Freq: Once | RESPIRATORY_TRACT | Status: DC

## 2021-09-02 MED ORDER — ORAL CARE MOUTH RINSE
15.0000 mL | OROMUCOSAL | Status: DC
  Administered 2021-09-02 – 2021-09-17 (×144): 15 mL via OROMUCOSAL

## 2021-09-02 MED ORDER — DIAZEPAM 5 MG PO TABS
5.0000 mg | ORAL_TABLET | Freq: Four times a day (QID) | ORAL | Status: DC | PRN
  Administered 2021-09-04 – 2021-09-14 (×8): 5 mg
  Filled 2021-09-02 (×10): qty 1

## 2021-09-02 MED ORDER — PANTOPRAZOLE SODIUM 40 MG IV SOLR
40.0000 mg | Freq: Every day | INTRAVENOUS | Status: DC
  Administered 2021-09-02: 40 mg via INTRAVENOUS
  Filled 2021-09-02: qty 10

## 2021-09-02 MED ORDER — LORATADINE 10 MG PO TABS
10.0000 mg | ORAL_TABLET | Freq: Every day | ORAL | Status: DC
  Administered 2021-09-02 – 2021-09-16 (×15): 10 mg
  Filled 2021-09-02 (×15): qty 1

## 2021-09-02 MED ORDER — FENTANYL BOLUS VIA INFUSION
50.0000 ug | INTRAVENOUS | Status: DC | PRN
  Administered 2021-09-04: 100 ug via INTRAVENOUS
  Administered 2021-09-06: 50 ug via INTRAVENOUS
  Filled 2021-09-02: qty 100

## 2021-09-02 MED ORDER — ACETAMINOPHEN 325 MG PO TABS
650.0000 mg | ORAL_TABLET | ORAL | Status: DC | PRN
  Administered 2021-09-11 – 2021-09-12 (×2): 650 mg via ORAL
  Filled 2021-09-02 (×2): qty 2

## 2021-09-02 MED ORDER — DOCUSATE SODIUM 50 MG/5ML PO LIQD
100.0000 mg | Freq: Two times a day (BID) | ORAL | Status: DC | PRN
  Administered 2021-09-05: 100 mg

## 2021-09-02 MED ORDER — REVEFENACIN 175 MCG/3ML IN SOLN
175.0000 ug | Freq: Every day | RESPIRATORY_TRACT | Status: DC
  Administered 2021-09-04 – 2021-09-17 (×7): 175 ug via RESPIRATORY_TRACT
  Filled 2021-09-02 (×8): qty 3

## 2021-09-02 MED ORDER — CITALOPRAM HYDROBROMIDE 20 MG PO TABS
20.0000 mg | ORAL_TABLET | Freq: Every day | ORAL | Status: DC
  Administered 2021-09-02 – 2021-09-16 (×15): 20 mg
  Filled 2021-09-02 (×16): qty 1

## 2021-09-02 MED ORDER — FENTANYL CITRATE PF 50 MCG/ML IJ SOSY
50.0000 ug | PREFILLED_SYRINGE | Freq: Once | INTRAMUSCULAR | Status: AC
  Administered 2021-09-02: 50 ug via INTRAVENOUS
  Filled 2021-09-02: qty 1

## 2021-09-02 MED ORDER — METHYLPREDNISOLONE SODIUM SUCC 125 MG IJ SOLR
120.0000 mg | INTRAMUSCULAR | Status: DC
  Administered 2021-09-02 – 2021-09-04 (×3): 120 mg via INTRAVENOUS
  Filled 2021-09-02 (×3): qty 2

## 2021-09-02 MED ORDER — ROCURONIUM BROMIDE 50 MG/5ML IV SOLN
INTRAVENOUS | Status: DC | PRN
  Administered 2021-09-02: 50 mg via INTRAVENOUS

## 2021-09-02 MED ORDER — SODIUM CHLORIDE 0.9 % IV SOLN
500.0000 mg | INTRAVENOUS | Status: AC
  Administered 2021-09-02 – 2021-09-04 (×3): 500 mg via INTRAVENOUS
  Filled 2021-09-02 (×3): qty 5

## 2021-09-02 MED ORDER — SODIUM CHLORIDE 0.9 % IV BOLUS
1000.0000 mL | Freq: Once | INTRAVENOUS | Status: AC
  Administered 2021-09-02: 1000 mL via INTRAVENOUS

## 2021-09-02 MED ORDER — POLYETHYLENE GLYCOL 3350 17 G PO PACK: 17.0000 g | PACK | Freq: Every day | ORAL | Status: DC | PRN

## 2021-09-02 MED ORDER — ARFORMOTEROL TARTRATE 15 MCG/2ML IN NEBU
15.0000 ug | INHALATION_SOLUTION | Freq: Two times a day (BID) | RESPIRATORY_TRACT | Status: DC
  Administered 2021-09-02 – 2021-09-17 (×30): 15 ug via RESPIRATORY_TRACT
  Filled 2021-09-02 (×27): qty 2

## 2021-09-02 MED ORDER — SODIUM CHLORIDE 0.9 % IV SOLN
2.0000 g | INTRAVENOUS | Status: DC
  Administered 2021-09-02 – 2021-09-05 (×4): 2 g via INTRAVENOUS
  Filled 2021-09-02 (×4): qty 20

## 2021-09-02 MED ORDER — FENTANYL 2500MCG IN NS 250ML (10MCG/ML) PREMIX INFUSION
50.0000 ug/h | INTRAVENOUS | Status: DC
  Administered 2021-09-02 – 2021-09-03 (×2): 50 ug/h via INTRAVENOUS
  Administered 2021-09-04: 200 ug/h via INTRAVENOUS
  Administered 2021-09-05: 50 ug/h via INTRAVENOUS
  Administered 2021-09-06: 75 ug/h via INTRAVENOUS
  Administered 2021-09-07: 300 ug/h via INTRAVENOUS
  Filled 2021-09-02 (×7): qty 250

## 2021-09-02 NOTE — H&P (Signed)
NAME:  Donna Curry, MRN:  [id number], DOB:  10/11/1955, LOS: 0 ADMISSION DATE:  09/09/2021, CONSULTATION DATE:  09/08/2021  REFERRING MD:  Dr. Dina Curry, CHIEF COMPLAINT:  COPD exacerbation    History of Present Illness:  [name] is a 66 year old female with a past medical history of chronic hypoxic respiratory failure 2/2 COPD on home supplemental oxygen (2L), bladder malignancy currently on Keytruda, iron deficiency anemia who presented to the ED with complaints of shortness of breath.  History limited 2/2 emergent intubation and sedation.  Per chart review, patient has been experiencing increasing shortness of breath for several days now.  She has been compliant with her home supplemental oxygen and nebulizer treatments.  On EMS arrival, she was hypoxic at 66% on home oxygen, with tachypnea and tripoding.  Patient was placed on CPAP with improvement in oxygenation up to 95%.  She was subsequently placed on BiPAP however due to worsening work of breathing, intubation was pursued.  Patient has received breathing treatments and high dose steroids.   Pertinent  Medical History   Past Medical History:  Diagnosis Date   Anemia    Asthma    Dyspnea    with exertion    Family history of adverse reaction to anesthesia    brother wakes up and dont know who he is and sister has n/v   Goals of care, counseling/discussion 02/09/2018   History of blood transfusion    Hypertension    Iron deficiency anemia due to chronic blood loss 03/01/2018   Measles as a child   Mumps as a child   TIA (transient ischemic attack)    Tobacco abuse    Significant Hospital Events: Including procedures, antibiotic start and stop dates in addition to other pertinent events   09/03/2021: Presented to the ED. Intubated. Transferred to ICU  Interim History / Subjective:  As above.   Objective   Blood pressure (!) 157/94, pulse (!) 116, resp. rate 20, height 5\' 2"  (1.575 m), SpO2 100 %.    Vent Mode: PRVC   No intake or output data in the 24 hours ending 08/26/2021 1424 There were no vitals filed for this visit.  Examination: General: Acute on chronic ill-appearing frail, elderly female HENT: Moist mucus membranes.  Lungs: Rhonchi present bilaterally in lower lung fields. No wheezing.  Cardiovascular: Regular rhythm with tachycardia, No pitting edema.  Abdomen: Scaphoid, non-distended, Positive BS Extremities: No gross deformities. Neuro: Sedated. Non-responsive to noxious stimuli and not following commands GU: N/A  Resolved Hospital Problem list     Assessment & Plan:   Acute on Chronic Hypoxic Respiratory Failure History of COPD  Patient presenting with 3-4 days of increased SOB found to be profoundly hypoxic requiring emergent intubation due to WOB. History of COPD on triple therapy although never underwent formal PFTs. Primary differential includes COPD exacerbation. However, patient presenting with tachycardia and hypoxia in the setting of active malignancy, so PE on differential as well. Beryle Flock is known to cause pneumonitis, so can consider CT imaging to further investigate. Chest xray without evidence of infiltrates or vascular congestion, so low suspicion for bacterial pneumonia or HF exacerbation. Will check an RVP.  - Full vent support - Daily SBT - Sedation/analgesia with Propofol, Precedex, Fentanyl  - CTA chest ordered - Trend troponin - Obtain ABG - Obtain procalcitonin and RVP - Start bronchodilators - Start Azithromycin  - Continue high dose IV steroids: Solumedrol 120 mg QD  # Hypertension  Blood pressure above goal. Home  medications include Amlodipine.   - Hold home Amlodipine till after CTA chest   # Malignant Neoplasm of the Bladder  - Diagnosed in 2019 - follows with Dr. Marin Curry. Currently on cycle 20 of Keytruda.   # Depression - Continue home Celexa - Valium PRN for anxiety   Best Practice (right click and "Reselect all SmartList Selections" daily)    Diet/type: NPO DVT prophylaxis: Lovenox  GI prophylaxis: PPI Lines: N/A Foley:  N/A Code Status:  full code Last date of multidisciplinary goals of care discussion [Pending]  Labs   CBC: Recent Labs  Lab 08/31/2021 1220  WBC 9.5  NEUTROABS 7.8*  HGB 13.3  HCT 41.9  MCV 99.1  PLT 272    Basic Metabolic Panel: Recent Labs  Lab 09/14/2021 1220  NA 140  K 4.5  CL 97*  CO2 24  GLUCOSE 150*  BUN 26*  CREATININE 1.10*  CALCIUM 9.7  MG 3.7*   GFR: CrCl cannot be calculated (Unknown ideal weight.). Recent Labs  Lab 09/07/2021 1220  WBC 9.5    Liver Function Tests: Recent Labs  Lab 09/12/2021 1220  AST 28  ALT 17  ALKPHOS 89  BILITOT 1.4*  PROT 7.8  ALBUMIN 3.4*   No results for input(s): LIPASE, AMYLASE in the last 168 hours. No results for input(s): AMMONIA in the last 168 hours.  ABG    Component Value Date/Time   PHART 7.404 01/04/2021 2136   PCO2ART 48.0 01/04/2021 2136   PO2ART 85.5 01/04/2021 2136   HCO3 29.6 (H) 01/04/2021 2136   TCO2 33 (H) 01/03/2021 2058   O2SAT 97.1 01/04/2021 2136     Coagulation Profile: No results for input(s): INR, PROTIME in the last 168 hours.  Cardiac Enzymes: No results for input(s): CKTOTAL, CKMB, CKMBINDEX, TROPONINI in the last 168 hours.  HbA1C: Hgb A1c MFr Bld  Date/Time Value Ref Range Status  12/04/2017 10:40 AM 4.5 (L) 4.6 - 6.5 % Final    Comment:    Glycemic Control Guidelines for People with Diabetes:Non Diabetic:  <6%Goal of Therapy: <7%Additional Action Suggested:  >8%   11/06/2017 10:01 AM 6.1 4.6 - 6.5 % Final    Comment:    Glycemic Control Guidelines for People with Diabetes:Non Diabetic:  <6%Goal of Therapy: <7%Additional Action Suggested:  >8%     CBG: Recent Labs  Lab 08/27/21 1252  GLUCAP 92    Review of Systems:   Negative except as noted above.   Past Medical History:  She,  has a past medical history of Anemia, Asthma, Dyspnea, Family history of adverse reaction to  anesthesia, Goals of care, counseling/discussion (02/09/2018), History of blood transfusion, Hypertension, Iron deficiency anemia due to chronic blood loss (03/01/2018), Measles (as a child), Mumps (as a child), TIA (transient ischemic attack), and Tobacco abuse.   Surgical History:   Past Surgical History:  Procedure Laterality Date   CYSTOSCOPY W/ URETERAL STENT PLACEMENT Right 12/13/2017   Procedure: CYSTOSCOPY WITH RIGHT RETROGRADE PYELOGRAM/URETERAL RIGHT STENT PLACEMENT;  Surgeon: [name], MD;  Location: [facility];  Service: Urology;  Laterality: Right;   IR IMAGING GUIDED PORT INSERTION  02/23/2018   TRANSURETHRAL RESECTION OF BLADDER TUMOR N/A 12/13/2017   Procedure: TRANSURETHRAL RESECTION OF BLADDER TUMOR (TURBT);  Surgeon: [name], MD;  Location: [facility];  Service: Urology;  Laterality: N/A;   TRANSURETHRAL RESECTION OF BLADDER TUMOR N/A 10/03/2018   Procedure: TRANSURETHRAL RESECTION OF BLADDER TUMOR (TURBT);  Surgeon: [name], MD;  Location: [facility];  Service: Urology;  Laterality: N/A;   TRANSURETHRAL RESECTION OF BLADDER TUMOR WITH GYRUS (TURBT-GYRUS)     [name] 12-13-17   tubes tied     uterine ablation     about 2005     Social History:   reports that she has been smoking cigarettes. She has been smoking an average of .25 packs per day. She has never used smokeless tobacco. She reports that she does not drink alcohol and does not use drugs.   Family History:  Her family history includes Diabetes in her father; HIV in her brother; Hypertension in her brother and mother.   Allergies No Known Allergies   Home Medications  Prior to Admission medications   Medication Sig Start Date End Date Taking? Authorizing Provider  acetaminophen (TYLENOL) 500 MG tablet Take 1,000 mg by mouth every 8 (eight) hours as needed for moderate pain.    [provider]  albuterol (PROVENTIL) (2.5 MG/3ML) 0.083% nebulizer solution Take 3 mLs (2.5 mg total) by  nebulization every 6 (six) hours as needed for wheezing or shortness of breath. Use 3 times daily x 4 days then every 6 hours as needed. 02/17/21   [name], NP  amitriptyline (ELAVIL) 10 MG tablet TAKE 1 TABLET BY MOUTH AT BEDTIME AS NEEDED FOR SLEEP 05/28/21   [name], NP  amLODipine (NORVASC) 10 MG tablet Take 1 tablet by mouth once daily 07/05/21   [name], NP  aspirin EC 81 MG tablet Take 81 mg by mouth daily.    [provider]  benzonatate (TESSALON) 100 MG capsule Take 1 capsule (100 mg total) by mouth 2 (two) times daily as needed for cough. 07/22/21   [name], NP  Cholecalciferol (VITAMIN D3) 75 MCG (3000 UT) TABS Take 1 tablet by mouth daily. 08/01/21   [name], NP  citalopram (CELEXA) 10 MG tablet Take 1 tablet (10 mg total) by mouth daily. 07/30/21   [name], NP  diazepam (VALIUM) 5 MG tablet Take 1 tablet (5 mg total) by mouth every 8 (eight) hours as needed for anxiety. 07/20/21   [name], MD  feeding supplement (ENSURE ENLIVE / ENSURE PLUS) LIQD Take 237 mLs by mouth 2 (two) times daily between meals. 09/24/20   Eugenie Filler, MD  Fluticasone-Umeclidin-Vilant (TRELEGY ELLIPTA) 100-62.5-25 MCG/ACT AEPB Inhale 1 puff into the lungs every 12 (twelve) hours as needed. 06/28/21   [name], MD  loratadine (CLARITIN) 10 MG tablet Take 1 tablet (10 mg total) by mouth daily. 09/25/20   Eugenie Filler, MD  ondansetron (ZOFRAN) 8 MG tablet Take 1 tablet (8 mg total) by mouth every 8 (eight) hours as needed for nausea or vomiting. 07/20/21   [name], MD  oxyCODONE (OXY IR/ROXICODONE) 5 MG immediate release tablet Take 1 tablet (5 mg total) by mouth every 6 (six) hours as needed for up to 30 doses for severe pain. 08/17/21   [name], MD  pantoprazole (PROTONIX) 40 MG tablet Take 1 tablet (40 mg total) by mouth daily at 6 (six) AM. 02/17/21   [name], NP  prochlorperazine  (COMPAZINE) 10 MG tablet Take 1 tablet (10 mg total) by mouth every 6 (six) hours as needed for nausea or vomiting. 07/20/21   [name], MD   Dr. Jose Persia Internal Medicine PGY-3  09/13/2021, 2:24 PM

## 2021-09-02 NOTE — ED Triage Notes (Signed)
Pt here via EMS from hom d/t SOB x3-4 DAYS. 2L [place] baseline. When EMS arrived pt tachpnic, tirpoding, RR 40and SpO2 66%. Diminished in all fields. No neb home tx.   EMs gave 15mg  albuterol 1mg  Atrovent 125mg  solumedrol  2g Mag

## 2021-09-02 NOTE — ED Provider Notes (Addendum)
[facility] Provider Note   CSN: [id number] Arrival date & time: 09/18/2021  1139     History  Chief Complaint  Patient presents with   Shortness of Breath    Donna Curry is a 66 y.o. female.  HPI  66 year old female with past medical history of COPD arrives by EMS in acute respiratory distress.  Report from EMS is that the patient has been having worsening shortness of breath for the past 3 to 4 days.  She is coming from home.  Normally wears 2 L of nasal cannula, has been using home nebulizers.  On arrival EMS states that she was hypoxic to 66% on her home oxygen, tachypneic, struggling to breathe, placed on CPAP with minimal improvement.  On arrival patient is alert and able to nod yes or no but otherwise tripoding, struggling to breathe.  Oxygenation greater than 95% on CPAP.  Patient received breathing treatment, Solu-Medrol and 2 g of mag in route.  Level 5 caveat due to acuity and respiratory status.  Home Medications Prior to Admission medications   Medication Sig Start Date End Date Taking? Authorizing Provider  acetaminophen (TYLENOL) 500 MG tablet Take 1,000 mg by mouth every 8 (eight) hours as needed for moderate pain.    [provider]  albuterol (PROVENTIL) (2.5 MG/3ML) 0.083% nebulizer solution Take 3 mLs (2.5 mg total) by nebulization every 6 (six) hours as needed for wheezing or shortness of breath. Use 3 times daily x 4 days then every 6 hours as needed. 02/17/21   [name], NP  amitriptyline (ELAVIL) 10 MG tablet TAKE 1 TABLET BY MOUTH AT BEDTIME AS NEEDED FOR SLEEP 05/28/21   [name], NP  amLODipine (NORVASC) 10 MG tablet Take 1 tablet by mouth once daily 07/05/21   [name], NP  aspirin EC 81 MG tablet Take 81 mg by mouth daily.    [provider]  benzonatate (TESSALON) 100 MG capsule Take 1 capsule (100 mg total) by mouth 2 (two) times daily as needed for cough. 07/22/21    [name], NP  Cholecalciferol (VITAMIN D3) 75 MCG (3000 UT) TABS Take 1 tablet by mouth daily. 08/01/21   [name], NP  citalopram (CELEXA) 10 MG tablet Take 1 tablet (10 mg total) by mouth daily. 07/30/21   [name], NP  diazepam (VALIUM) 5 MG tablet Take 1 tablet (5 mg total) by mouth every 8 (eight) hours as needed for anxiety. 07/20/21   [name], MD  feeding supplement (ENSURE ENLIVE / ENSURE PLUS) LIQD Take 237 mLs by mouth 2 (two) times daily between meals. 09/24/20   Eugenie Filler, MD  Fluticasone-Umeclidin-Vilant (TRELEGY ELLIPTA) 100-62.5-25 MCG/ACT AEPB Inhale 1 puff into the lungs every 12 (twelve) hours as needed. 06/28/21   [name], MD  loratadine (CLARITIN) 10 MG tablet Take 1 tablet (10 mg total) by mouth daily. 09/25/20   Eugenie Filler, MD  ondansetron (ZOFRAN) 8 MG tablet Take 1 tablet (8 mg total) by mouth every 8 (eight) hours as needed for nausea or vomiting. 07/20/21   [name], MD  oxyCODONE (OXY IR/ROXICODONE) 5 MG immediate release tablet Take 1 tablet (5 mg total) by mouth every 6 (six) hours as needed for up to 30 doses for severe pain. 08/17/21   [name], MD  pantoprazole (PROTONIX) 40 MG tablet Take 1 tablet (40 mg total) by mouth daily at 6 (six) AM. 02/17/21   [name], NP  prochlorperazine (COMPAZINE) 10 MG tablet Take 1 tablet (10 mg total) by mouth every 6 (six) hours as needed for nausea or vomiting. 07/20/21   [name], MD      Allergies    Patient has no known allergies.    Review of Systems   Review of Systems  Unable to perform ROS: Intubated   Physical Exam Updated Vital Signs BP (!) 154/80    Pulse 97    Resp 18    Ht 5\' 2"  (1.575 m)    SpO2 100%    BMI 13.57 kg/m  Physical Exam Vitals and nursing note reviewed.  Constitutional:      General: She is in acute distress.     Appearance: Normal appearance. She is ill-appearing and diaphoretic.  HENT:     Head:  Normocephalic.     Mouth/Throat:     Comments: Dry Cardiovascular:     Rate and Rhythm: Tachycardia present.  Pulmonary:     Effort: Tachypnea, accessory muscle usage and respiratory distress present.     Breath sounds: Decreased breath sounds present.  Abdominal:     Tenderness: There is no abdominal tenderness.  Skin:    General: Skin is warm.  Neurological:     Mental Status: She is alert. Mental status is at baseline.    ED Results / Procedures / Treatments   Labs (all labs ordered are listed, but only abnormal results are displayed) Labs Reviewed  CBC WITH DIFFERENTIAL/PLATELET - Abnormal; Notable for the following components:      Result Value   RDW 16.6 (*)    Neutro Abs 7.8 (*)    All other components within normal limits  COMPREHENSIVE METABOLIC PANEL - Abnormal; Notable for the following components:   Chloride 97 (*)    Glucose, Bld 150 (*)    BUN 26 (*)    Creatinine, Ser 1.10 (*)    Albumin 3.4 (*)    Total Bilirubin 1.4 (*)    GFR, Estimated 56 (*)    Anion gap 19 (*)    All other components within normal limits  MAGNESIUM - Abnormal; Notable for the following components:   Magnesium 3.7 (*)    All other components within normal limits  TROPONIN I (HIGH SENSITIVITY) - Abnormal; Notable for the following components:   Troponin I (High Sensitivity) 31 (*)    All other components within normal limits  RESP PANEL BY RT-PCR (FLU A&B, COVID) ARPGX2  BRAIN NATRIURETIC PEPTIDE  TROPONIN I (HIGH SENSITIVITY)    EKG EKG Interpretation  Date/Time:  Thursday September 02 2021 12:10:59 EST Ventricular Rate:  130 PR Interval:  97 QRS Duration: 80 QT Interval:  302 QTC Calculation: 445 R Axis:   82 Text Interpretation: Sinus tachycardia Atrial premature complex LAE, consider biatrial enlargement LVH with secondary repolarization abnormality Anterior infarct, old ST depr, consider ischemia, inferior leads Minimal ST elevation, lateral leads Confirmed by Lavenia Atlas [phone]) on 09/13/2021 12:24:46 PM  Radiology DG Chest Portable 1 View  Result Date: 08/25/2021 CLINICAL DATA:  Status post intubation. EXAM: PORTABLE CHEST 1 VIEW COMPARISON:  January 03, 2021. FINDINGS: The heart size and mediastinal contours are within normal limits. Endotracheal tube is in grossly good position. Right internal jugular Port-A-Cath is unchanged. Distal tip of nasogastric tube is seen in proximal stomach. Hyperexpansion of the lungs is again noted. The visualized skeletal structures are unremarkable. IMPRESSION: Endotracheal tube in grossly good position. Distal tip of nasogastric tube seen in proximal stomach. Hyperexpansion  of the lungs. Electronically Signed   By: Marijo Conception M.D.   On: 08/27/2021 12:23    Procedures .Critical Care Performed by: Lorelle Gibbs, DO Authorized by: Lorelle Gibbs, DO   Critical care provider statement:    Critical care time (minutes):  60   Critical care time was exclusive of:  Separately billable procedures and treating other patients   Critical care was necessary to treat or prevent imminent or life-threatening deterioration of the following conditions:  Respiratory failure   Critical care was time spent personally by me on the following activities:  Development of treatment plan with patient or surrogate, discussions with consultants, evaluation of patient's response to treatment, examination of patient, ordering and review of laboratory studies, ordering and review of radiographic studies, ordering and performing treatments and interventions, pulse oximetry, re-evaluation of patient's condition and review of old charts   I assumed direction of critical care for this patient from another provider in my specialty: no     Care discussed with: admitting provider   Procedure Name: Intubation Date/Time: 09/16/2021 1:51 PM Performed by: Lorelle Gibbs, DO Pre-anesthesia Checklist: Patient identified, Patient being monitored, Emergency  Drugs available, Timeout performed and Suction available Oxygen Delivery Method: Non-rebreather mask Preoxygenation: Pre-oxygenation with 100% oxygen Induction Type: Rapid sequence Ventilation: Mask ventilation without difficulty Laryngoscope Size: Glidescope Grade View: Grade II Tube size: 7.0 mm Number of attempts: 1 Placement Confirmation: ETT inserted through vocal cords under direct vision, CO2 detector and Breath sounds checked- equal and bilateral Tube secured with: ETT holder       Medications Ordered in ED Medications  ipratropium-albuterol (DUONEB) 0.5-2.5 (3) MG/3ML nebulizer solution 3 mL (has no administration in time range)  sodium chloride 0.9 % bolus 1,000 mL (1,000 mLs Intravenous New Bag/Given 09/18/2021 1220)  fentaNYL (SUBLIMAZE) injection 50 mcg (50 mcg Intravenous Given 08/27/2021 1245)    ED Course/ Medical Decision Making/ A&P                           Medical Decision Making Amount and/or Complexity of Data Reviewed Labs: ordered. Radiology: ordered.  Risk Prescription drug management. Decision regarding hospitalization.   This patient presents to the ED for concern of severe respiratory distress, this involves an extensive number of treatment options, and is a complaint that carries with it a high risk of complications and morbidity.  The differential diagnosis includes hypoxia, respiratory distress, COPD exacerbation   Additional history obtained: -Additional history obtained from EMS -External records from outside source obtained and reviewed including: Chart review including previous notes, labs, imaging, consultation notes   Lab Tests: -I ordered, reviewed, and interpreted labs.  The pertinent results include: Blood work that thus far appears baseline, slightly elevated Trope at 31   EKG -Sinus tachycardia   Imaging Studies ordered: -I ordered imaging studies including chest x-ray -I independently visualized and interpreted imaging which  showed appropriate placement of ET tube, no pneumothorax/pneumonia -I agree with the radiologist interpretation   Medicines ordered and prescription drug management: -I ordered medication including RSI meds, sedation meds, breathing treatments for presentation -Reevaluation of the patient after these medicines showed that the patient improved -I have reviewed the patients home medicines and have made adjustments as needed   Consultations Obtained: I requested consultation with the ICU/critical care team,  and discussed lab and imaging findings as well as pertinent plan - they recommend: Admission   ED Course: 66 year old female presents emergency  department and what appears to be a COPD exacerbation.  Found to be profoundly hypoxic and in respiratory distress by EMS.  Placed on CPAP, received breathing treatment, steroids and magnesium in route.  On arrival patient has oxygenation greater than 90% but significant increased work of breathing, tripoding.  Patient transitioned to our BiPAP but did not tolerate in regards to respiratory drive.  Patient was initially alert, able to confirm her name and date of birth.  Previously on her chart had been a full code and DNR listed.  Patient confirms with nodding that she is full code and would wish for a breathing tube.  Unable to emergently reach family members at that time.  Patient was intubated based off of the patient's wishes at that time.  She was alert and appeared oriented.  Patient intubated without complication.  Tachycardia improved after intubation and sedation.   Critical Interventions: BiPAP, intubation, ICU admission   Cardiac Monitoring: The patient was maintained on a cardiac monitor.  I personally viewed and interpreted the cardiac monitored which showed an underlying rhythm of: Sinus rhythm   Reevaluation: After the interventions noted above, I reevaluated the patient and found that they have :improved   Dispostion: Patients  evaluation and results requires admission for further treatment and care.  Spoke with ICU team, reviewed patient's ED course and they accept admission.  Patient agrees with admission plan, offers no new complaints and is stable/unchanged at time of admit.    OF NOTE: I was not able to get in contact with her daughter [name] who we believe is her legal guardian.  I was able to get a hold of one of her sisters, Vermel Pinnix.  She is aware that the patient is here, intubated with plans for admission.  She will attempt to reach out to her daughter to help [place] with get into contact.  Per the sister's knowledge the patient is full code and she agrees with intubation.  This specific sister can be reached at [phone] (cell).    Final Clinical Impression(s) / ED Diagnoses Final diagnoses:  None    Rx / DC Orders ED Discharge Orders     None         Lorelle Gibbs, DO 09/13/2021 1352    Cristalle Rohm, Alvin Critchley, DO 09/09/2021 1409

## 2021-09-02 NOTE — Progress Notes (Signed)
Pt transported on vent from [facility] to CT and to 3M05 without any complications. RN at bedside, RT will continue to monitor.

## 2021-09-02 NOTE — Code Documentation (Signed)
Intubation successful. #7 tube. 24 at the lip. Good color change. Bi-lateral breath sounds heard,

## 2021-09-03 ENCOUNTER — Inpatient Hospital Stay (HOSPITAL_COMMUNITY): Payer: Medicare Other

## 2021-09-03 DIAGNOSIS — J441 Chronic obstructive pulmonary disease with (acute) exacerbation: Secondary | ICD-10-CM

## 2021-09-03 DIAGNOSIS — J9601 Acute respiratory failure with hypoxia: Secondary | ICD-10-CM

## 2021-09-03 LAB — GLUCOSE, CAPILLARY
Glucose-Capillary: 120 mg/dL — ABNORMAL HIGH (ref 70–99)
Glucose-Capillary: 117 mg/dL — ABNORMAL HIGH (ref 70–99)
Glucose-Capillary: 140 mg/dL — ABNORMAL HIGH (ref 70–99)
Glucose-Capillary: 118 mg/dL — ABNORMAL HIGH (ref 70–99)
Glucose-Capillary: 153 mg/dL — ABNORMAL HIGH (ref 70–99)
Glucose-Capillary: 140 mg/dL — ABNORMAL HIGH (ref 70–99)

## 2021-09-03 LAB — CBC WITH DIFFERENTIAL/PLATELET
Abs Immature Granulocytes: 0.08 10*3/uL — ABNORMAL HIGH (ref 0.00–0.07)
Basophils Absolute: 0 10*3/uL (ref 0.0–0.1)
Basophils Relative: 0 %
Eosinophils Absolute: 0 10*3/uL (ref 0.0–0.5)
Eosinophils Relative: 0 %
HCT: 27.5 % — ABNORMAL LOW (ref 36.0–46.0)
Hemoglobin: 8.6 g/dL — ABNORMAL LOW (ref 12.0–15.0)
Immature Granulocytes: 1 %
Lymphocytes Relative: 3 %
Lymphs Abs: 0.3 10*3/uL — ABNORMAL LOW (ref 0.7–4.0)
MCH: 30.5 pg (ref 26.0–34.0)
MCHC: 31.3 g/dL (ref 30.0–36.0)
MCV: 97.5 fL (ref 80.0–100.0)
Monocytes Absolute: 0.5 10*3/uL (ref 0.1–1.0)
Monocytes Relative: 5 %
Neutro Abs: 10.4 10*3/uL — ABNORMAL HIGH (ref 1.7–7.7)
Neutrophils Relative %: 91 %
Platelets: 211 10*3/uL (ref 150–400)
RBC: 2.82 MIL/uL — ABNORMAL LOW (ref 3.87–5.11)
RDW: 17.1 % — ABNORMAL HIGH (ref 11.5–15.5)
WBC Morphology: INCREASED
WBC: 11.3 10*3/uL — ABNORMAL HIGH (ref 4.0–10.5)
nRBC: 0 % (ref 0.0–0.2)

## 2021-09-03 LAB — CBC
HCT: 27.8 % — ABNORMAL LOW (ref 36.0–46.0)
Hemoglobin: 9.1 g/dL — ABNORMAL LOW (ref 12.0–15.0)
MCH: 31.3 pg (ref 26.0–34.0)
MCHC: 32.7 g/dL (ref 30.0–36.0)
MCV: 95.5 fL (ref 80.0–100.0)
Platelets: 189 10*3/uL (ref 150–400)
RBC: 2.91 MIL/uL — ABNORMAL LOW (ref 3.87–5.11)
RDW: 16.5 % — ABNORMAL HIGH (ref 11.5–15.5)
WBC: 9.2 10*3/uL (ref 4.0–10.5)
nRBC: 0 % (ref 0.0–0.2)

## 2021-09-03 LAB — BASIC METABOLIC PANEL
Anion gap: 9 (ref 5–15)
BUN: 22 mg/dL (ref 8–23)
CO2: 29 mmol/L (ref 22–32)
Calcium: 8.8 mg/dL — ABNORMAL LOW (ref 8.9–10.3)
Chloride: 103 mmol/L (ref 98–111)
Creatinine, Ser: 0.88 mg/dL (ref 0.44–1.00)
GFR, Estimated: >60 mL/min (ref >60)
Glucose, Bld: 126 mg/dL — ABNORMAL HIGH (ref 70–99)
Potassium: 3.6 mmol/L (ref 3.5–5.1)
Sodium: 141 mmol/L (ref 135–145)

## 2021-09-03 LAB — MAGNESIUM
Magnesium: 2.3 mg/dL (ref 1.7–2.4)
Magnesium: 2.6 mg/dL — ABNORMAL HIGH (ref 1.7–2.4)

## 2021-09-03 LAB — POCT I-STAT 7, (LYTES, BLD GAS, ICA,H+H)
Acid-Base Excess: 4 mmol/L — ABNORMAL HIGH (ref 0.0–2.0)
Bicarbonate: 31.2 mmol/L — ABNORMAL HIGH (ref 20.0–28.0)
Calcium, Ion: 1.29 mmol/L (ref 1.15–1.40)
HCT: 29 % — ABNORMAL LOW (ref 36.0–46.0)
Hemoglobin: 9.9 g/dL — ABNORMAL LOW (ref 12.0–15.0)
O2 Saturation: 97 %
Patient temperature: 97.8
Potassium: 5.3 mmol/L — ABNORMAL HIGH (ref 3.5–5.1)
Sodium: 142 mmol/L (ref 135–145)
TCO2: 33 mmol/L — ABNORMAL HIGH (ref 22–32)
pCO2 arterial: 58.8 mmHg — ABNORMAL HIGH (ref 32.0–48.0)
pH, Arterial: 7.33 — ABNORMAL LOW (ref 7.350–7.450)
pO2, Arterial: 99 mmHg (ref 83.0–108.0)

## 2021-09-03 LAB — PHOSPHORUS: Phosphorus: 3.8 mg/dL (ref 2.5–4.6)

## 2021-09-03 LAB — VITAMIN D 25 HYDROXY (VIT D DEFICIENCY, FRACTURES): Vit D, 25-Hydroxy: 27.15 ng/mL — ABNORMAL LOW (ref 30–100)

## 2021-09-03 LAB — VITAMIN B12: Vitamin B-12: 424 pg/mL (ref 180–914)

## 2021-09-03 LAB — TRIGLYCERIDES: Triglycerides: 117 mg/dL (ref <150)

## 2021-09-03 LAB — FOLATE: Folate: 7.6 ng/mL (ref >5.9)

## 2021-09-03 MED ORDER — POTASSIUM CHLORIDE 20 MEQ PO PACK
40.0000 meq | PACK | Freq: Once | ORAL | Status: AC
  Administered 2021-09-03: 40 meq
  Filled 2021-09-03: qty 2

## 2021-09-03 MED ORDER — OSMOLITE 1.2 CAL PO LIQD
1000.0000 mL | ORAL | Status: DC
  Administered 2021-09-03 – 2021-09-07 (×4): 1000 mL
  Filled 2021-09-03 (×9): qty 1000

## 2021-09-03 MED ORDER — SODIUM CHLORIDE 0.9% FLUSH
10.0000 mL | Freq: Two times a day (BID) | INTRAVENOUS | Status: DC
  Administered 2021-09-03 (×2): 10 mL
  Administered 2021-09-03: 20 mL
  Administered 2021-09-04: 10 mL
  Administered 2021-09-04: 20 mL
  Administered 2021-09-05 – 2021-09-11 (×5): 10 mL
  Administered 2021-09-12: 13 mL
  Administered 2021-09-13 – 2021-09-16 (×5): 10 mL

## 2021-09-03 MED ORDER — SODIUM CHLORIDE 0.9% FLUSH: 10.0000 mL | INTRAVENOUS | Status: DC | PRN

## 2021-09-03 MED ORDER — ADULT MULTIVITAMIN W/MINERALS CH
1.0000 | ORAL_TABLET | Freq: Every day | ORAL | Status: DC
  Administered 2021-09-03 – 2021-09-16 (×14): 1
  Filled 2021-09-03 (×14): qty 1

## 2021-09-03 MED ORDER — THIAMINE HCL 100 MG PO TABS
100.0000 mg | ORAL_TABLET | Freq: Every day | ORAL | Status: DC
  Administered 2021-09-03 – 2021-09-16 (×14): 100 mg
  Filled 2021-09-03 (×14): qty 1

## 2021-09-03 MED ORDER — CHLORHEXIDINE GLUCONATE CLOTH 2 % EX PADS
6.0000 | MEDICATED_PAD | Freq: Every day | CUTANEOUS | Status: DC
  Administered 2021-09-03 – 2021-09-15 (×13): 6 via TOPICAL

## 2021-09-03 MED ORDER — PANTOPRAZOLE 2 MG/ML SUSPENSION
40.0000 mg | Freq: Every day | ORAL | Status: DC
  Administered 2021-09-03 – 2021-09-16 (×14): 40 mg
  Filled 2021-09-03 (×14): qty 20

## 2021-09-03 MED ORDER — SODIUM CHLORIDE 0.9 % IV SOLN: INTRAVENOUS | Status: DC | PRN

## 2021-09-03 NOTE — Progress Notes (Signed)
Initial Nutrition Assessment  DOCUMENTATION CODES:   Underweight, Severe malnutrition in context of chronic illness  INTERVENTION:   - Plan to replace OG tube with Cortrak tube today  Initiate tube feeds via Cortrak tube: - Start Osmolite 1.2 @ 20 ml/hr and advance by 10 ml q 8 hours to goal rate of 50 ml/hr (1200 ml/day)  Tube feeding regimen at goal rate provides 1440 kcal, 67 grams of protein, and 984 ml of H2O.  Monitor magnesium, potassium, and phosphorus BID for at least 3 days, MD to replete as needed, as pt is at risk for refeeding syndrome given severe malnutrition.  - MVI with minerals daily per tube  - Recommend thiamine 100 mg per tube x 5-7 days  - Checking vitamin A, vitamin C, vitamin D, vitamin B-12, folate, zinc, and copper labs  NUTRITION DIAGNOSIS:   Severe Malnutrition related to chronic illness (COPD, bladder cancer) as evidenced by severe fat depletion, severe muscle depletion.  GOAL:   Patient will meet greater than or equal to 90% of their needs  MONITOR:   Vent status, Labs, Weight trends, TF tolerance  REASON FOR ASSESSMENT:   Ventilator, Consult Assessment of nutrition requirement/status  ASSESSMENT:   66 year old female who presented to the ED on 2/09 with SOB x 3-4 days. Pt required intubation in the ED. PMH of COPD, bladder cancer on Keytruda, anemia. Pt admitted with acute on chronic hypoxic respiratory failure.  Discussed pt with RN and during ICU rounds. Consult received for tube feeding initiation and management. Pt with OGT in stomach, currently to low intermittent suction. Plan to exchange OGT for Cortrak today given pt is severely malnourished and unlikely to meet needs orally after future extubation.  Unable to obtain diet and weight history at this time. Per available weight history in chart, pt's weight has fluctuated between 29.9-35.8 kg over the last 11 months. No real weight trends noted.  Pt at high risk for refeeding with  BMI 12.62 and severe chronic malnutrition. Will start tube feeds at trickle rate and slowly advance to goal. RN aware of plan. Discussed adding thiamine with Pharmacy and CCM who agreed.  Reviewed RD notes from previous admissions as well as RD notes from pt's appointments with [place] RD. Pt with long history of malnutrition, diagnosed by RD as early as September 2017. Pt has previously told RD that she has a good appetite at baseline and eats well. Pt previously consumed oral nutrition supplements like Ensure or Boost at home.  Pt at high risk for vitamin and mineral deficiencies. Checking multiple vitamin and mineral labs per discussion with CCM.  Admit weight: 33.7 kg Current weight: 34.4 kg  Patient is currently intubated on ventilator support MV: 9.5 L/min Temp (24hrs), Avg:97.6 F (36.4 C), Min:96.6 F (35.9 C), Max:98.2 F (36.8 C)  Drips: Propofol: 6.1 ml/hr (provides 161 kcal daily from lipid) LR: 75 ml/hr  Medications reviewed and include: SSI q 4 hours, IV solu-medrol, IV protonix, IV abx  Vitamin/Mineral Profile: Vitamin B12: pending Folate B9: pending Vitamin A: pending Vitamin D: pending Vitamin C: pending Copper: pending Zinc: pending  Labs reviewed: potassium 5.3, hemoglobin 9.9 CBG's: 106-153 x 24 hours  NUTRITION - FOCUSED PHYSICAL EXAM:  Flowsheet Row Most Recent Value  Orbital Region Severe depletion  Upper Arm Region Severe depletion  Thoracic and Lumbar Region Severe depletion  Buccal Region Unable to assess  Temple Region Severe depletion  Clavicle Bone Region Severe depletion  Clavicle and Acromion Bone Region Severe depletion  Scapular Bone Region Severe depletion  Dorsal Hand Severe depletion  Patellar Region Severe depletion  Anterior Thigh Region Severe depletion  Posterior Calf Region Severe depletion  Edema (RD Assessment) None  Hair Reviewed  Eyes Unable to assess  Mouth Unable to assess  Skin Reviewed  Nails Reviewed        Diet Order:   Diet Order             Diet NPO time specified  Diet effective now                   EDUCATION NEEDS:   Not appropriate for education at this time  Skin:  Skin Assessment: Reviewed RN Assessment  Last BM:  no documented BM  Height:   Ht Readings from Last 1 Encounters:  08/26/2021 5\' 5"  (1.651 m)    Weight:   Wt Readings from Last 1 Encounters:  09/03/21 34.4 kg    BMI:  Body mass index is 12.62 kg/m.  Estimated Nutritional Needs:   Kcal:  1300-1500  Protein:  55-70 grams  Fluid:  1.3-1.5 L    Gustavus Bryant, MS, RD, LDN Inpatient Clinical Dietitian Please see AMiON for contact information.

## 2021-09-03 NOTE — Progress Notes (Signed)
NAME:  Donna Curry, MRN:  [id number], DOB:  [birth year], LOS: 1 ADMISSION DATE:  09/06/2021, CONSULTATION DATE:  09/01/2021  REFERRING MD:  Dr. Dina Rich, CHIEF COMPLAINT:  COPD exacerbation    History of Present Illness:  [name] is a 66 year old female with a past medical history of chronic hypoxic respiratory failure 2/2 COPD on home supplemental oxygen (2L), bladder malignancy currently on Keytruda, iron deficiency anemia who presented to the ED with complaints of shortness of breath.  History limited 2/2 emergent intubation and sedation.  Per chart review, patient has been experiencing increasing shortness of breath for several days now.  She has been compliant with her home supplemental oxygen and nebulizer treatments.  On EMS arrival, she was hypoxic at 66% on home oxygen, with tachypnea and tripoding.  Patient was placed on CPAP with improvement in oxygenation up to 95%.  She was subsequently placed on BiPAP however due to worsening work of breathing, intubation was pursued.  Patient has received breathing treatments and high dose steroids.   Pertinent  Medical History   Past Medical History:  Diagnosis Date   Anemia    Asthma    Dyspnea    with exertion    Family history of adverse reaction to anesthesia    brother wakes up and dont know who he is and sister has n/v   Goals of care, counseling/discussion 02/09/2018   History of blood transfusion    Hypertension    Iron deficiency anemia due to chronic blood loss 03/01/2018   Measles as a child   Mumps as a child   TIA (transient ischemic attack)    Tobacco abuse    Significant Hospital Events: Including procedures, antibiotic start and stop dates in addition to other pertinent events   09/10/2021: Presented to the ED. Intubated. Transferred to ICU  Interim History / Subjective:   No acute overnight events.   This AM, patient is very sedated.    Objective   Blood pressure (!) 161/94, pulse 74, temperature 97.8 F  (36.6 C), temperature source Axillary, resp. rate (!) 24, height 5\' 5"  (1.651 m), weight 34.4 kg, SpO2 98 %.    Vent Mode: PRVC FiO2 (%):  [40 %-100 %] 40 % Set Rate:  [18 bmp-24 bmp] 24 bmp Vt Set:  [400 mL] 400 mL PEEP:  [5 cmH20] 5 cmH20 Plateau Pressure:  [16 cmH20-20 cmH20] 17 cmH20   Intake/Output Summary (Last 24 hours) at 09/03/2021 0721 Last data filed at 09/03/2021 0600 Gross per 24 hour  Intake 1507.98 ml  Output --  Net 1507.98 ml   Filed Weights   09/18/2021 1421 08/26/2021 1745 09/03/21 0339  Weight: 33.7 kg 33.4 kg 34.4 kg   Examination: General: Acute on chronic ill-appearing frail, elderly female HENT: Moist mucus membranes.  Lungs: Rhonchi present bilaterally in lower lung fields however improving. No wheezing.  Cardiovascular: Regular rhythm with tachycardia, No pitting edema.  Abdomen: Scaphoid, non-distended, Positive BS Extremities: No gross deformities. Neuro: Sedated. Non-responsive to noxious stimuli and not following commands 2/2 sedation GU: N/A  Resolved Hospital Problem list     Assessment & Plan:   Multi-focal CAP Acute on Chronic Hypoxic Respiratory Failure History of COPD  CTA obtained on 2/10 with numerous tree-in-bud nodularities, consistent with multifocal pneumonia. Given chronicity, concern for MAI. RVP negative. Heavy secretions.   - Full vent support - Daily SBT - Sedation/analgesia with Propofol, Fentanyl  - Continue bronchodilators - Continue Ceftriaxone + Azithromycin  - Continue Solumedrol 120  mg QD - Follow up tracheal aspirate cultures  - Obtain AFB cultures from tracheal aspirate  # Acute on Chronic Normocytic Anemia # History of IDA Hemoglobin decreased from 13 to 9 on admission. No evidence of bleeding on examination.  - Repeat CBC this afternoon  # Hypertension  Blood pressure above goal. Home medications include Amlodipine.   - Restart home Amlodipine  # Malignant Neoplasm of the Bladder  - Diagnosed in 2019 -  follows with [name]. Currently on cycle 20 of Keytruda.   # Depression - Continue home Celexa - Valium PRN for anxiety   Best Practice (right click and "Reselect all SmartList Selections" daily)   Diet/type: NPO DVT prophylaxis: Lovenox GI prophylaxis: PPI Lines: N/A Foley:  N/A Code Status:  full code Last date of multidisciplinary goals of care discussion [Unable to contact on 2/9]  Labs   CBC: Recent Labs  Lab 08/28/2021 1220 09/18/2021 1601 09/03/21 0348  WBC 9.5  --  9.2  NEUTROABS 7.8*  --   --   HGB 13.3 12.6 9.1*  HCT 41.9 37.0 27.8*  MCV 99.1  --  95.5  PLT 207  --  189     Basic Metabolic Panel: Recent Labs  Lab 09/07/2021 1220 09/11/2021 1601 09/03/21 0348  NA 140 139 141  K 4.5 3.2* 3.6  CL 97*  --  103  CO2 24  --  29  GLUCOSE 150*  --  126*  BUN 26*  --  22  CREATININE 1.10*  --  0.88  CALCIUM 9.7  --  8.8*  MG 3.7*  --  2.3    GFR: Estimated Creatinine Clearance: 34.6 mL/min (by C-G formula based on SCr of 0.88 mg/dL). Recent Labs  Lab 08/29/2021 1220 08/31/2021 1832 09/03/21 0348  PROCALCITON  --  16.12  --   WBC 9.5  --  9.2     Liver Function Tests: Recent Labs  Lab 09/16/2021 1220  AST 28  ALT 17  ALKPHOS 89  BILITOT 1.4*  PROT 7.8  ALBUMIN 3.4*    No results for input(s): LIPASE, AMYLASE in the last 168 hours. No results for input(s): AMMONIA in the last 168 hours.  ABG    Component Value Date/Time   PHART 7.280 (L) 09/05/2021 1601   PCO2ART 69.5 (HH) 09/13/2021 1601   PO2ART 527 (H) 09/11/2021 1601   HCO3 32.8 (H) 08/28/2021 1601   TCO2 35 (H) 09/10/2021 1601   O2SAT 100.0 08/31/2021 1601      Coagulation Profile: No results for input(s): INR, PROTIME in the last 168 hours.  Cardiac Enzymes: No results for input(s): CKTOTAL, CKMB, CKMBINDEX, TROPONINI in the last 168 hours.  HbA1C: Hgb A1c MFr Bld  Date/Time Value Ref Range Status  08/26/2021 06:32 PM 5.3 4.8 - 5.6 % Final    Comment:    (NOTE) Pre  diabetes:          5.7%-6.4%  Diabetes:              >6.4%  Glycemic control for   <7.0% adults with diabetes   12/04/2017 10:40 AM 4.5 (L) 4.6 - 6.5 % Final    Comment:    Glycemic Control Guidelines for People with Diabetes:Non Diabetic:  <6%Goal of Therapy: <7%Additional Action Suggested:  >8%     CBG: Recent Labs  Lab 08/27/21 1252 09/07/2021 1734 09/13/2021 2056 09/07/2021 2331 09/03/21 0329  GLUCAP 92 153* 106* 120* 120*     Review of Systems:  Negative except as noted above.   Past Medical History:  She,  has a past medical history of Anemia, Asthma, Dyspnea, Family history of adverse reaction to anesthesia, Goals of care, counseling/discussion (02/09/2018), History of blood transfusion, Hypertension, Iron deficiency anemia due to chronic blood loss (03/01/2018), Measles (as a child), Mumps (as a child), TIA (transient ischemic attack), and Tobacco abuse.   Surgical History:   Past Surgical History:  Procedure Laterality Date   CYSTOSCOPY W/ URETERAL STENT PLACEMENT Right 12/13/2017   Procedure: CYSTOSCOPY WITH RIGHT RETROGRADE PYELOGRAM/URETERAL RIGHT STENT PLACEMENT;  Surgeon: [name], MD;  Location: [facility];  Service: Urology;  Laterality: Right;   IR IMAGING GUIDED PORT INSERTION  02/23/2018   TRANSURETHRAL RESECTION OF BLADDER TUMOR N/A 12/13/2017   Procedure: TRANSURETHRAL RESECTION OF BLADDER TUMOR (TURBT);  Surgeon: [name], MD;  Location: [facility];  Service: Urology;  Laterality: N/A;   TRANSURETHRAL RESECTION OF BLADDER TUMOR N/A 10/03/2018   Procedure: TRANSURETHRAL RESECTION OF BLADDER TUMOR (TURBT);  Surgeon: [name], MD;  Location: [facility];  Service: Urology;  Laterality: N/A;   TRANSURETHRAL RESECTION OF BLADDER TUMOR WITH GYRUS (TURBT-GYRUS)     [name] 12-13-17   tubes tied     uterine ablation     about 2005     Social History:   reports that she has been smoking cigarettes. She has been smoking an average of .25 packs per day.  She has never used smokeless tobacco. She reports that she does not drink alcohol and does not use drugs.   Family History:  Her family history includes Diabetes in her father; HIV in her brother; Hypertension in her brother and mother.   Allergies No Known Allergies   Home Medications  Prior to Admission medications   Medication Sig Start Date End Date Taking? Authorizing Provider  acetaminophen (TYLENOL) 500 MG tablet Take 1,000 mg by mouth every 8 (eight) hours as needed for moderate pain.    [provider]  albuterol (PROVENTIL) (2.5 MG/3ML) 0.083% nebulizer solution Take 3 mLs (2.5 mg total) by nebulization every 6 (six) hours as needed for wheezing or shortness of breath. Use 3 times daily x 4 days then every 6 hours as needed. 02/17/21   [name], NP  amitriptyline (ELAVIL) 10 MG tablet TAKE 1 TABLET BY MOUTH AT BEDTIME AS NEEDED FOR SLEEP 05/28/21   [name], NP  amLODipine (NORVASC) 10 MG tablet Take 1 tablet by mouth once daily 07/05/21   [name], NP  aspirin EC 81 MG tablet Take 81 mg by mouth daily.    [provider]  benzonatate (TESSALON) 100 MG capsule Take 1 capsule (100 mg total) by mouth 2 (two) times daily as needed for cough. 07/22/21   [name], NP  Cholecalciferol (VITAMIN D3) 75 MCG (3000 UT) TABS Take 1 tablet by mouth daily. 08/01/21   [name], NP  citalopram (CELEXA) 10 MG tablet Take 1 tablet (10 mg total) by mouth daily. 07/30/21   [name], NP  diazepam (VALIUM) 5 MG tablet Take 1 tablet (5 mg total) by mouth every 8 (eight) hours as needed for anxiety. 07/20/21   [name], MD  feeding supplement (ENSURE ENLIVE / ENSURE PLUS) LIQD Take 237 mLs by mouth 2 (two) times daily between meals. 09/24/20   Eugenie Filler, MD  Fluticasone-Umeclidin-Vilant (TRELEGY ELLIPTA) 100-62.5-25 MCG/ACT AEPB Inhale 1 puff into the lungs every 12 (twelve) hours as needed. 06/28/21  [name], MD  loratadine (CLARITIN) 10 MG tablet Take 1 tablet (10 mg total) by mouth daily. 09/25/20   Eugenie Filler, MD  ondansetron (ZOFRAN) 8 MG tablet Take 1 tablet (8 mg total) by mouth every 8 (eight) hours as needed for nausea or vomiting. 07/20/21   [name], MD  oxyCODONE (OXY IR/ROXICODONE) 5 MG immediate release tablet Take 1 tablet (5 mg total) by mouth every 6 (six) hours as needed for up to 30 doses for severe pain. 08/17/21   [name], MD  pantoprazole (PROTONIX) 40 MG tablet Take 1 tablet (40 mg total) by mouth daily at 6 (six) AM. 02/17/21   [name], NP  prochlorperazine (COMPAZINE) 10 MG tablet Take 1 tablet (10 mg total) by mouth every 6 (six) hours as needed for nausea or vomiting. 07/20/21   [name], MD   Dr. Jose Persia Internal Medicine PGY-3  09/03/2021, 7:21 AM

## 2021-09-03 NOTE — Progress Notes (Signed)
[facility] ADULT ICU REPLACEMENT PROTOCOL   The patient does apply for the [facility] Adult ICU Electrolyte Replacment Protocol based on the criteria listed below:   1.Exclusion criteria: TCTS patients, ECMO patients, and Dialysis patients 2. Is GFR >/= 30 ml/min? Yes.    Patient's GFR today is >60 3. Is SCr </= 2? Yes.   Patient's SCr is 0.88 mg/dL 4. Did SCr increase >/= 0.5 in 24 hours? No. 5.Pt's weight >40kg  Yes.   6. Abnormal electrolyte(s): K 3.6  7. Electrolytes replaced per protocol   Christeen Douglas 09/03/2021 4:42 AM

## 2021-09-03 NOTE — Procedures (Signed)
Cortrak  Person Inserting Tube:  Esaw Dace, RD Tube Type:  Cortrak - 43 inches Tube Size:  10 Tube Location:  Left nare Initial Placement:  Stomach Secured by: Bridle Technique Used to Measure Tube Placement:  Marking at nare/corner of mouth Cortrak Secured At:  68 cm Procedure Comments:  Cortrak Tube Team Note:  Consult received to place a Cortrak feeding tube.   X-ray is required, abdominal x-ray has been ordered by the Cortrak team. Please confirm tube placement before using the Cortrak tube.   If the tube becomes dislodged please keep the tube and contact the Cortrak team at [email or web address] ([email or web address]) for replacement.  If after hours and replacement cannot be delayed, place a NG tube and confirm placement with an abdominal x-ray.   Kerman Passey MS, RDN, LDN, CNSC Registered Dietitian III Clinical Nutrition RD Pager and On-Call Pager Number Located in [place]

## 2021-09-03 NOTE — Progress Notes (Signed)
Patient's daughter updated via telephone. All questions and concerns addressed.

## 2021-09-04 DIAGNOSIS — J441 Chronic obstructive pulmonary disease with (acute) exacerbation: Secondary | ICD-10-CM | POA: Diagnosis not present

## 2021-09-04 DIAGNOSIS — J9601 Acute respiratory failure with hypoxia: Secondary | ICD-10-CM | POA: Diagnosis not present

## 2021-09-04 LAB — GLUCOSE, CAPILLARY
Glucose-Capillary: 107 mg/dL — ABNORMAL HIGH (ref 70–99)
Glucose-Capillary: 144 mg/dL — ABNORMAL HIGH (ref 70–99)
Glucose-Capillary: 157 mg/dL — ABNORMAL HIGH (ref 70–99)
Glucose-Capillary: 158 mg/dL — ABNORMAL HIGH (ref 70–99)
Glucose-Capillary: 170 mg/dL — ABNORMAL HIGH (ref 70–99)
Glucose-Capillary: 181 mg/dL — ABNORMAL HIGH (ref 70–99)

## 2021-09-04 LAB — BASIC METABOLIC PANEL
Anion gap: 8 (ref 5–15)
BUN: 34 mg/dL — ABNORMAL HIGH (ref 8–23)
CO2: 29 mmol/L (ref 22–32)
Calcium: 9 mg/dL (ref 8.9–10.3)
Chloride: 107 mmol/L (ref 98–111)
Creatinine, Ser: 1.08 mg/dL — ABNORMAL HIGH (ref 0.44–1.00)
GFR, Estimated: 57 mL/min — ABNORMAL LOW (ref >60)
Glucose, Bld: 169 mg/dL — ABNORMAL HIGH (ref 70–99)
Potassium: 5 mmol/L (ref 3.5–5.1)
Sodium: 144 mmol/L (ref 135–145)

## 2021-09-04 LAB — CBC
HCT: 28.7 % — ABNORMAL LOW (ref 36.0–46.0)
Hemoglobin: 9.2 g/dL — ABNORMAL LOW (ref 12.0–15.0)
MCH: 31.4 pg (ref 26.0–34.0)
MCHC: 32.1 g/dL (ref 30.0–36.0)
MCV: 98 fL (ref 80.0–100.0)
Platelets: 211 10*3/uL (ref 150–400)
RBC: 2.93 MIL/uL — ABNORMAL LOW (ref 3.87–5.11)
RDW: 17.2 % — ABNORMAL HIGH (ref 11.5–15.5)
WBC: 8.2 10*3/uL (ref 4.0–10.5)
nRBC: 0 % (ref 0.0–0.2)

## 2021-09-04 LAB — MAGNESIUM: Magnesium: 2.6 mg/dL — ABNORMAL HIGH (ref 1.7–2.4)

## 2021-09-04 LAB — PHOSPHORUS: Phosphorus: 4 mg/dL (ref 2.5–4.6)

## 2021-09-04 MED ORDER — MIDAZOLAM HCL 2 MG/2ML IJ SOLN: 1.0000 mg | INTRAMUSCULAR | Status: DC | PRN

## 2021-09-04 MED ORDER — ALBUTEROL SULFATE (2.5 MG/3ML) 0.083% IN NEBU
2.5000 mg | INHALATION_SOLUTION | RESPIRATORY_TRACT | Status: DC | PRN
  Administered 2021-09-04 (×2): 2.5 mg via RESPIRATORY_TRACT
  Filled 2021-09-04 (×2): qty 3

## 2021-09-04 MED ORDER — PROPOFOL 1000 MG/100ML IV EMUL: 5.0000 ug/kg/min | INTRAVENOUS | Status: DC

## 2021-09-04 MED ORDER — PROPOFOL 1000 MG/100ML IV EMUL
0.0000 ug/kg/min | INTRAVENOUS | Status: DC
  Administered 2021-09-04 (×2): 50 ug/kg/min via INTRAVENOUS
  Administered 2021-09-05: 40 ug/kg/min via INTRAVENOUS
  Administered 2021-09-05: 50 ug/kg/min via INTRAVENOUS
  Administered 2021-09-06: 40 ug/kg/min via INTRAVENOUS
  Administered 2021-09-06: 35 ug/kg/min via INTRAVENOUS
  Administered 2021-09-07: 50 ug/kg/min via INTRAVENOUS
  Administered 2021-09-07: 35 ug/kg/min via INTRAVENOUS
  Administered 2021-09-08 – 2021-09-09 (×4): 50 ug/kg/min via INTRAVENOUS
  Administered 2021-09-09: 20 ug/kg/min via INTRAVENOUS
  Administered 2021-09-10: 40 ug/kg/min via INTRAVENOUS
  Filled 2021-09-04 (×14): qty 100

## 2021-09-04 MED ORDER — AMLODIPINE BESYLATE 10 MG PO TABS
10.0000 mg | ORAL_TABLET | Freq: Every day | ORAL | Status: DC
  Administered 2021-09-04 – 2021-09-07 (×4): 10 mg
  Filled 2021-09-04 (×4): qty 1

## 2021-09-04 MED ORDER — DOCUSATE SODIUM 50 MG/5ML PO LIQD
100.0000 mg | Freq: Two times a day (BID) | ORAL | Status: DC
  Administered 2021-09-04 – 2021-09-09 (×7): 100 mg
  Filled 2021-09-04 (×10): qty 10

## 2021-09-04 MED ORDER — MIDAZOLAM HCL 2 MG/2ML IJ SOLN
2.0000 mg | Freq: Once | INTRAMUSCULAR | Status: AC
  Administered 2021-09-04: 2 mg via INTRAVENOUS

## 2021-09-04 MED ORDER — ALBUTEROL SULFATE (2.5 MG/3ML) 0.083% IN NEBU
2.5000 mg | INHALATION_SOLUTION | Freq: Three times a day (TID) | RESPIRATORY_TRACT | Status: DC
  Administered 2021-09-04 – 2021-09-17 (×38): 2.5 mg via RESPIRATORY_TRACT
  Filled 2021-09-04 (×38): qty 3

## 2021-09-04 MED ORDER — POLYETHYLENE GLYCOL 3350 17 G PO PACK
17.0000 g | PACK | Freq: Every day | ORAL | Status: DC
  Administered 2021-09-04 – 2021-09-07 (×4): 17 g
  Filled 2021-09-04 (×5): qty 1

## 2021-09-04 MED ORDER — MIDAZOLAM HCL 2 MG/2ML IJ SOLN
INTRAMUSCULAR | Status: AC
  Filled 2021-09-04: qty 2

## 2021-09-04 MED ORDER — DEXMEDETOMIDINE HCL IN NACL 400 MCG/100ML IV SOLN: 0.0000 ug/kg/h | INTRAVENOUS | Status: DC

## 2021-09-04 NOTE — Progress Notes (Signed)
NAME:  Donna Curry, MRN:  [id number], DOB:  [birth year], LOS: 2 ADMISSION DATE:  09/11/2021, CONSULTATION DATE:  08/28/2021  REFERRING MD:  Dr. Dina Rich, CHIEF COMPLAINT:  COPD exacerbation    History of Present Illness:  [name] is a 66 year old female with a past medical history of chronic hypoxic respiratory failure 2/2 COPD on home supplemental oxygen (2L), bladder malignancy currently on Keytruda, iron deficiency anemia who presented to the ED with complaints of shortness of breath.  History limited 2/2 emergent intubation and sedation.  Per chart review, patient has been experiencing increasing shortness of breath for several days now.  She has been compliant with her home supplemental oxygen and nebulizer treatments.  On EMS arrival, she was hypoxic at 66% on home oxygen, with tachypnea and tripoding.  Patient was placed on CPAP with improvement in oxygenation up to 95%.  She was subsequently placed on BiPAP however due to worsening work of breathing, intubation was pursued.  Patient has received breathing treatments and high dose steroids.   Pertinent  Medical History   Past Medical History:  Diagnosis Date   Anemia    Asthma    Dyspnea    with exertion    Family history of adverse reaction to anesthesia    brother wakes up and dont know who he is and sister has n/v   Goals of care, counseling/discussion 02/09/2018   History of blood transfusion    Hypertension    Iron deficiency anemia due to chronic blood loss 03/01/2018   Measles as a child   Mumps as a child   TIA (transient ischemic attack)    Tobacco abuse    Significant Hospital Events: Including procedures, antibiotic start and stop dates in addition to other pertinent events   09/12/2021: Presented to the ED. Intubated. Transferred to ICU  Interim History / Subjective:  Fentanyl 50 Propofol 40 0.40, PEEP 5 I/O +2.8 L total Serum creatinine 1.08, K 5.0  Objective   Blood pressure 110/75, pulse (!) 58,  temperature 98.5 F (36.9 C), temperature source Oral, resp. rate (!) 27, height 5\' 5"  (1.651 m), weight 31.2 kg, SpO2 100 %.    Vent Mode: PRVC FiO2 (%):  [40 %] 40 % Set Rate:  [24 bmp-28 bmp] 28 bmp Vt Set:  [400 mL] 400 mL PEEP:  [5 cmH20] 5 cmH20 Plateau Pressure:  [18 cmH20-22 cmH20] 19 cmH20   Intake/Output Summary (Last 24 hours) at 09/04/2021 0753 Last data filed at 09/04/2021 0700 Gross per 24 hour  Intake 1583.03 ml  Output 250 ml  Net 1333.03 ml   Filed Weights   09/20/2021 1745 09/03/21 0339 09/04/21 0400  Weight: 33.4 kg 34.4 kg 31.2 kg   Examination: General: Cachectic woman, ventilated, beginning to wake up HENT: ET tube in place, pupils equal Lungs: Distant but less rhonchorous, no wheezing Cardiovascular: Regular, no murmur Abdomen: Soft, nondistended with positive bowel sounds Extremities: No edema Neuro: Waking up (propofol held).  She will nod to questions, follows commands.  Weak cough.  Moves all extremities GU: N/A  Resolved Hospital Problem list     Assessment & Plan:   Multi-focal CAP Acute on Chronic Hypoxic Respiratory Failure History of COPD  CTA obtained on 2/10 with numerous tree-in-bud nodularities, consistent with multifocal pneumonia but some of this looks chronic. Given chronicity, concern for MAI. RVP negative. Heavy secretions.  -Continue PRVC, goal transition to PSV as bronchospasm resolves -Wean sedating medication, will consider transition propofol to Precedex -Bronchodilators as  ordered -Solu-Medrol as ordered, consider weaning 2/12 -Ceftriaxone and azithromycin -Follow respiratory culture data including AFB  # Acute on Chronic Normocytic Anemia # History of IDA Hemoglobin decreased from 13 to 9 on admission. No evidence of bleeding on examination. -Following CBC Follow for any evidence of active blood loss  # Hypertension  -Plan to restart home amlodipine to facilitate weaning of sedation  # Malignant Neoplasm of the  Bladder  -Has been on maintenance suppressive Keytruda every 2 months.  Managed by [name].  Will need to consider this regimen in light of possible mycobacterial disease.  Can likely be addressed as an outpatient  # Depression -Continue home Celexa -Diazepam available as needed   Best Practice (right click and "Reselect all SmartList Selections" daily)   Diet/type: NPO DVT prophylaxis: Lovenox GI prophylaxis: PPI Lines: N/A Foley:  N/A Code Status:  full code Last date of multidisciplinary goals of care discussion [Daughter updated 2/10]  Labs   CBC: Recent Labs  Lab 09/10/2021 1220 08/25/2021 1601 09/03/21 0348 09/03/21 0840 09/03/21 1139 09/04/21 0324  WBC 9.5  --  9.2  --  11.3* 8.2  NEUTROABS 7.8*  --   --   --  10.4*  --   HGB 13.3 12.6 9.1* 9.9* 8.6* 9.2*  HCT 41.9 37.0 27.8* 29.0* 27.5* 28.7*  MCV 99.1  --  95.5  --  97.5 98.0  PLT 207  --  189  --  211 175    Basic Metabolic Panel: Recent Labs  Lab 09/14/2021 1220 09/18/2021 1601 09/03/21 0348 09/03/21 0840 09/03/21 1650 09/04/21 0324  NA 140 139 141 142  --  144  K 4.5 3.2* 3.6 5.3*  --  5.0  CL 97*  --  103  --   --  107  CO2 24  --  29  --   --  29  GLUCOSE 150*  --  126*  --   --  169*  BUN 26*  --  22  --   --  34*  CREATININE 1.10*  --  0.88  --   --  1.08*  CALCIUM 9.7  --  8.8*  --   --  9.0  MG 3.7*  --  2.3  --  2.6* 2.6*  PHOS  --   --   --   --  3.8 4.0   GFR: Estimated Creatinine Clearance: 25.6 mL/min (A) (by C-G formula based on SCr of 1.08 mg/dL (H)). Recent Labs  Lab 09/07/2021 1220 09/09/2021 1832 09/03/21 0348 09/03/21 1139 09/04/21 0324  PROCALCITON  --  16.12  --   --   --   WBC 9.5  --  9.2 11.3* 8.2    Liver Function Tests: Recent Labs  Lab 08/30/2021 1220  AST 28  ALT 17  ALKPHOS 89  BILITOT 1.4*  PROT 7.8  ALBUMIN 3.4*   No results for input(s): LIPASE, AMYLASE in the last 168 hours. No results for input(s): AMMONIA in the last 168 hours.  ABG    Component  Value Date/Time   PHART 7.330 (L) 09/03/2021 0840   PCO2ART 58.8 (H) 09/03/2021 0840   PO2ART 99 09/03/2021 0840   HCO3 31.2 (H) 09/03/2021 0840   TCO2 33 (H) 09/03/2021 0840   O2SAT 97.0 09/03/2021 0840     Coagulation Profile: No results for input(s): INR, PROTIME in the last 168 hours.  Cardiac Enzymes: No results for input(s): CKTOTAL, CKMB, CKMBINDEX, TROPONINI in the last 168 hours.  HbA1C: Hgb  A1c MFr Bld  Date/Time Value Ref Range Status  08/27/2021 06:32 PM 5.3 4.8 - 5.6 % Final    Comment:    (NOTE) Pre diabetes:          5.7%-6.4%  Diabetes:              >6.4%  Glycemic control for   <7.0% adults with diabetes   12/04/2017 10:40 AM 4.5 (L) 4.6 - 6.5 % Final    Comment:    Glycemic Control Guidelines for People with Diabetes:Non Diabetic:  <6%Goal of Therapy: <7%Additional Action Suggested:  >8%     CBG: Recent Labs  Lab 09/03/21 1519 09/03/21 1912 09/03/21 2315 09/04/21 0330 09/04/21 0724  GLUCAP 118* 153* 140* 181* 157*    Independent CC time 32 min  Baltazar Apo, MD, PhD 09/04/2021, 7:53 AM [facility] [phone] or if no answer before 7:00PM call [phone] For any issues after 7:00PM please call eLink [phone]

## 2021-09-04 NOTE — Progress Notes (Signed)
Awake , attempting to self extubate , interfering with care , lines  and vent . Attempting to  get out of bed . Contacted Elink for versed

## 2021-09-04 NOTE — Progress Notes (Signed)
RT called to room for pt dropping sats into low 60's. Pt was placed back on full vent support and FIO2 was increased to 100%. Pt was lavaged and suctioned for large thick yellow secretions. Bite block was placed. Sats increased to 100%. MD was made aware.

## 2021-09-04 NOTE — Progress Notes (Signed)
[facility] Progress Note Patient Name: Donna Curry DOB: Oct 21, 1955 MRN: [id number]   Date of Service  09/04/2021  HPI/Events of Note  Patient agitated and sitting up in bed. At risk for self-extubation.   eICU Interventions  Versed 2 mg once PAD protocol with PRN versed ordered Fentanyl max increased to 400 mcg/h     Intervention Category Minor Interventions: Agitation / anxiety - evaluation and management  Ajane Novella Rodman Pickle 09/04/2021, 7:24 PM

## 2021-09-05 ENCOUNTER — Inpatient Hospital Stay (HOSPITAL_COMMUNITY): Payer: Medicare Other

## 2021-09-05 DIAGNOSIS — J9601 Acute respiratory failure with hypoxia: Secondary | ICD-10-CM | POA: Diagnosis not present

## 2021-09-05 DIAGNOSIS — J441 Chronic obstructive pulmonary disease with (acute) exacerbation: Secondary | ICD-10-CM | POA: Diagnosis not present

## 2021-09-05 LAB — CBC
HCT: 28.1 % — ABNORMAL LOW (ref 36.0–46.0)
Hemoglobin: 9 g/dL — ABNORMAL LOW (ref 12.0–15.0)
MCH: 31.6 pg (ref 26.0–34.0)
MCHC: 32 g/dL (ref 30.0–36.0)
MCV: 98.6 fL (ref 80.0–100.0)
Platelets: 208 10*3/uL (ref 150–400)
RBC: 2.85 MIL/uL — ABNORMAL LOW (ref 3.87–5.11)
RDW: 17.8 % — ABNORMAL HIGH (ref 11.5–15.5)
WBC: 10.5 10*3/uL (ref 4.0–10.5)
nRBC: 0.3 % — ABNORMAL HIGH (ref 0.0–0.2)

## 2021-09-05 LAB — GLUCOSE, CAPILLARY
Glucose-Capillary: 106 mg/dL — ABNORMAL HIGH (ref 70–99)
Glucose-Capillary: 120 mg/dL — ABNORMAL HIGH (ref 70–99)
Glucose-Capillary: 135 mg/dL — ABNORMAL HIGH (ref 70–99)
Glucose-Capillary: 138 mg/dL — ABNORMAL HIGH (ref 70–99)
Glucose-Capillary: 140 mg/dL — ABNORMAL HIGH (ref 70–99)
Glucose-Capillary: 165 mg/dL — ABNORMAL HIGH (ref 70–99)

## 2021-09-05 LAB — BASIC METABOLIC PANEL
Anion gap: 9 (ref 5–15)
BUN: 47 mg/dL — ABNORMAL HIGH (ref 8–23)
CO2: 30 mmol/L (ref 22–32)
Calcium: 8.7 mg/dL — ABNORMAL LOW (ref 8.9–10.3)
Chloride: 109 mmol/L (ref 98–111)
Creatinine, Ser: 1.48 mg/dL — ABNORMAL HIGH (ref 0.44–1.00)
GFR, Estimated: 39 mL/min — ABNORMAL LOW (ref >60)
Glucose, Bld: 120 mg/dL — ABNORMAL HIGH (ref 70–99)
Potassium: 5.3 mmol/L — ABNORMAL HIGH (ref 3.5–5.1)
Sodium: 148 mmol/L — ABNORMAL HIGH (ref 135–145)

## 2021-09-05 LAB — MAGNESIUM: Magnesium: 2.6 mg/dL — ABNORMAL HIGH (ref 1.7–2.4)

## 2021-09-05 LAB — PHOSPHORUS: Phosphorus: 2.8 mg/dL (ref 2.5–4.6)

## 2021-09-05 LAB — TRIGLYCERIDES: Triglycerides: 134 mg/dL (ref <150)

## 2021-09-05 MED ORDER — PREDNISONE 20 MG PO TABS
40.0000 mg | ORAL_TABLET | Freq: Every day | ORAL | Status: DC
  Administered 2021-09-05 – 2021-09-06 (×2): 40 mg
  Filled 2021-09-05 (×2): qty 2

## 2021-09-05 MED ORDER — FUROSEMIDE 10 MG/ML IJ SOLN
40.0000 mg | Freq: Two times a day (BID) | INTRAMUSCULAR | Status: AC
  Administered 2021-09-05 (×2): 40 mg via INTRAVENOUS
  Filled 2021-09-05 (×2): qty 4

## 2021-09-05 MED ORDER — HEPARIN SODIUM (PORCINE) 5000 UNIT/ML IJ SOLN
5000.0000 [IU] | Freq: Three times a day (TID) | INTRAMUSCULAR | Status: DC
  Administered 2021-09-05 – 2021-09-17 (×35): 5000 [IU] via SUBCUTANEOUS
  Filled 2021-09-05 (×35): qty 1

## 2021-09-05 NOTE — Progress Notes (Signed)
NAME:  Donna Curry, MRN:  [id number], DOB:  09-Nov-1955, LOS: 3 ADMISSION DATE:  09/03/2021, CONSULTATION DATE:  09/01/2021  REFERRING MD:  Dr. Dina Rich, CHIEF COMPLAINT:  COPD exacerbation    History of Present Illness:  [name] is a 66 year old female with a past medical history of chronic hypoxic respiratory failure 2/2 COPD on home supplemental oxygen (2L), bladder malignancy currently on Keytruda, iron deficiency anemia who presented to the ED with complaints of shortness of breath.  History limited 2/2 emergent intubation and sedation.  Per chart review, patient has been experiencing increasing shortness of breath for several days now.  She has been compliant with her home supplemental oxygen and nebulizer treatments.  On EMS arrival, she was hypoxic at 66% on home oxygen, with tachypnea and tripoding.  Patient was placed on CPAP with improvement in oxygenation up to 95%.  She was subsequently placed on BiPAP however due to worsening work of breathing, intubation was pursued.  Patient has received breathing treatments and high dose steroids.   Pertinent  Medical History   Past Medical History:  Diagnosis Date   Anemia    Asthma    Dyspnea    with exertion    Family history of adverse reaction to anesthesia    brother wakes up and dont know who he is and sister has n/v   Goals of care, counseling/discussion 02/09/2018   History of blood transfusion    Hypertension    Iron deficiency anemia due to chronic blood loss 03/01/2018   Measles as a child   Mumps as a child   TIA (transient ischemic attack)    Tobacco abuse    Significant Hospital Events: Including procedures, antibiotic start and stop dates in addition to other pertinent events   09/19/2021: Presented to the ED. Intubated. Transferred to ICU  Interim History / Subjective:  Initially tolerated decrease propofol and PSV on 2/11 but then quickly developed tachypnea, wheezing, agitation and desaturation.  Unclear  whether this was parentally bronchospasm or component of agitation. Back on fentanyl and propofol infusions 0.40, PEEP 5 I/O+ 4.7 L total Serum creatinine 1.08 > 0.48 K 5.3  Objective   Blood pressure 131/68, pulse 72, temperature 98.4 F (36.9 C), temperature source Oral, resp. rate (!) 24, height 5\' 5"  (1.651 m), weight 36 kg, SpO2 93 %.    Vent Mode: PRVC FiO2 (%):  [30 %-50 %] 30 % Set Rate:  [24 bmp] 24 bmp Vt Set:  [400 mL-450 mL] 450 mL PEEP:  [5 cmH20] 5 cmH20 Plateau Pressure:  [16 cmH20-27 cmH20] 24 cmH20   Intake/Output Summary (Last 24 hours) at 09/05/2021 6629 Last data filed at 09/05/2021 0800 Gross per 24 hour  Intake 1940.56 ml  Output 75 ml  Net 1865.56 ml   Filed Weights   09/03/21 0339 09/04/21 0400 09/05/21 0500  Weight: 34.4 kg 31.2 kg 36 kg   Examination: General: Cachectic woman, ventilated HENT: ET tube in place, pupils equal Lungs: Very distant, no wheezing.  Few scattered rhonchi Cardiovascular: Regular, no murmur Abdomen: Nondistended, positive bowel sounds Extremities: No edema Neuro: Will turn head to voice, tries to open eyes (on propofol).  Does not follow commands GU: Deferred  Resolved Hospital Problem list     Assessment & Plan:   Multi-focal CAP Acute on Chronic Hypoxic Respiratory Failure History of COPD  CTA obtained on 2/10 with numerous tree-in-bud nodularities, consistent with multifocal pneumonia but some of this looks chronic. Given chronicity, concern for MAI.  RVP negative. Heavy secretions.  -Continue PRVC.  Will retry decrease sedation and PSV on 2/12 -Consider transition propofol to Precedex but she has required fast acting sedation with episodes of tachypnea, desaturation. -Bronchodilators as ordered -Begin to wean Solu-Medrol 2/12, convert to prednisone -Ceftriaxone.  Azithromycin completed -Follow respiratory cultures including AFB given evidence for scattered micro and macro nodular disease on CT chest  # Acute on  Chronic Normocytic Anemia # History of IDA Hemoglobin decreased from 13 to 9 on admission. No evidence of bleeding on examination. -Follow CBC -Follow for any evidence of active blood loss, no noted  #Acute renal failure, now oliguric 2/12 -Rising serum creatinine 2/12, oliguric -Trial diuresis 2/12 -Renal ultrasound  # Hypertension  -Continue amlodipine  # Malignant Neoplasm of the Bladder  -Has been on maintenance suppressive Keytruda every 2 months.  Managed by [name].  Will need to consider this regimen in light of possible mycobacterial disease.  Can likely be addressed as an outpatient  # Depression -Continue home Celexa -Diazepam available as needed   Best Practice (right click and "Reselect all SmartList Selections" daily)   Diet/type: NPO DVT prophylaxis: Lovenox GI prophylaxis: PPI Lines: N/A Foley:  N/A Code Status:  full code Last date of multidisciplinary goals of care discussion [Daughter updated 2/10]  Labs   CBC: Recent Labs  Lab 09/06/2021 1220 08/30/2021 1601 09/03/21 0348 09/03/21 0840 09/03/21 1139 09/04/21 0324 09/05/21 0500  WBC 9.5  --  9.2  --  11.3* 8.2 10.5  NEUTROABS 7.8*  --   --   --  10.4*  --   --   HGB 13.3   < > 9.1* 9.9* 8.6* 9.2* 9.0*  HCT 41.9   < > 27.8* 29.0* 27.5* 28.7* 28.1*  MCV 99.1  --  95.5  --  97.5 98.0 98.6  PLT 207  --  189  --  211 211 208   < > = values in this interval not displayed.    Basic Metabolic Panel: Recent Labs  Lab 09/08/2021 1220 09/01/2021 1601 09/03/21 0348 09/03/21 0840 09/03/21 1650 09/04/21 0324 09/05/21 0500  NA 140 139 141 142  --  144 148*  K 4.5 3.2* 3.6 5.3*  --  5.0 5.3*  CL 97*  --  103  --   --  107 109  CO2 24  --  29  --   --  29 30  GLUCOSE 150*  --  126*  --   --  169* 120*  BUN 26*  --  22  --   --  34* 47*  CREATININE 1.10*  --  0.88  --   --  1.08* 1.48*  CALCIUM 9.7  --  8.8*  --   --  9.0 8.7*  MG 3.7*  --  2.3  --  2.6* 2.6* 2.6*  PHOS  --   --   --   --  3.8 4.0  2.8   GFR: Estimated Creatinine Clearance: 21.5 mL/min (A) (by C-G formula based on SCr of 1.48 mg/dL (H)). Recent Labs  Lab 09/16/2021 1832 09/03/21 0348 09/03/21 1139 09/04/21 0324 09/05/21 0500  PROCALCITON 16.12  --   --   --   --   WBC  --  9.2 11.3* 8.2 10.5    Liver Function Tests: Recent Labs  Lab 09/09/2021 1220  AST 28  ALT 17  ALKPHOS 89  BILITOT 1.4*  PROT 7.8  ALBUMIN 3.4*   No results for input(s): LIPASE, AMYLASE  in the last 168 hours. No results for input(s): AMMONIA in the last 168 hours.  ABG    Component Value Date/Time   PHART 7.330 (L) 09/03/2021 0840   PCO2ART 58.8 (H) 09/03/2021 0840   PO2ART 99 09/03/2021 0840   HCO3 31.2 (H) 09/03/2021 0840   TCO2 33 (H) 09/03/2021 0840   O2SAT 97.0 09/03/2021 0840     Coagulation Profile: No results for input(s): INR, PROTIME in the last 168 hours.  Cardiac Enzymes: No results for input(s): CKTOTAL, CKMB, CKMBINDEX, TROPONINI in the last 168 hours.  HbA1C: Hgb A1c MFr Bld  Date/Time Value Ref Range Status  09/18/2021 06:32 PM 5.3 4.8 - 5.6 % Final    Comment:    (NOTE) Pre diabetes:          5.7%-6.4%  Diabetes:              >6.4%  Glycemic control for   <7.0% adults with diabetes   12/04/2017 10:40 AM 4.5 (L) 4.6 - 6.5 % Final    Comment:    Glycemic Control Guidelines for People with Diabetes:Non Diabetic:  <6%Goal of Therapy: <7%Additional Action Suggested:  >8%     CBG: Recent Labs  Lab 09/04/21 1526 09/04/21 1932 09/04/21 2341 09/05/21 0335 09/05/21 0744  GLUCAP 144* 170* 107* 138* 106*    Independent CC time 32 min  Baltazar Apo, MD, PhD 09/05/2021, 9:24 AM [facility] [phone] or if no answer before 7:00PM call [phone] For any issues after 7:00PM please call eLink [phone]

## 2021-09-06 DIAGNOSIS — N170 Acute kidney failure with tubular necrosis: Secondary | ICD-10-CM

## 2021-09-06 DIAGNOSIS — J9601 Acute respiratory failure with hypoxia: Secondary | ICD-10-CM | POA: Diagnosis not present

## 2021-09-06 DIAGNOSIS — J441 Chronic obstructive pulmonary disease with (acute) exacerbation: Secondary | ICD-10-CM | POA: Diagnosis not present

## 2021-09-06 LAB — BASIC METABOLIC PANEL
Anion gap: 7 (ref 5–15)
Anion gap: 9 (ref 5–15)
BUN: 54 mg/dL — ABNORMAL HIGH (ref 8–23)
BUN: 59 mg/dL — ABNORMAL HIGH (ref 8–23)
CO2: 34 mmol/L — ABNORMAL HIGH (ref 22–32)
CO2: 34 mmol/L — ABNORMAL HIGH (ref 22–32)
Calcium: 8.4 mg/dL — ABNORMAL LOW (ref 8.9–10.3)
Calcium: 8.7 mg/dL — ABNORMAL LOW (ref 8.9–10.3)
Chloride: 106 mmol/L (ref 98–111)
Chloride: 107 mmol/L (ref 98–111)
Creatinine, Ser: 1.61 mg/dL — ABNORMAL HIGH (ref 0.44–1.00)
Creatinine, Ser: 2.1 mg/dL — ABNORMAL HIGH (ref 0.44–1.00)
GFR, Estimated: 35 mL/min — ABNORMAL LOW (ref >60)
GFR, Estimated: 26 mL/min — ABNORMAL LOW (ref >60)
Glucose, Bld: 132 mg/dL — ABNORMAL HIGH (ref 70–99)
Glucose, Bld: 159 mg/dL — ABNORMAL HIGH (ref 70–99)
Potassium: 5.6 mmol/L — ABNORMAL HIGH (ref 3.5–5.1)
Potassium: 6.1 mmol/L — ABNORMAL HIGH (ref 3.5–5.1)
Sodium: 147 mmol/L — ABNORMAL HIGH (ref 135–145)
Sodium: 150 mmol/L — ABNORMAL HIGH (ref 135–145)

## 2021-09-06 LAB — CBC
HCT: 29.4 % — ABNORMAL LOW (ref 36.0–46.0)
Hemoglobin: 9.2 g/dL — ABNORMAL LOW (ref 12.0–15.0)
MCH: 31.3 pg (ref 26.0–34.0)
MCHC: 31.3 g/dL (ref 30.0–36.0)
MCV: 100 fL (ref 80.0–100.0)
Platelets: 220 10*3/uL (ref 150–400)
RBC: 2.94 MIL/uL — ABNORMAL LOW (ref 3.87–5.11)
RDW: 18.3 % — ABNORMAL HIGH (ref 11.5–15.5)
WBC: 11 10*3/uL — ABNORMAL HIGH (ref 4.0–10.5)
nRBC: 0.3 % — ABNORMAL HIGH (ref 0.0–0.2)

## 2021-09-06 LAB — URINALYSIS, ROUTINE W REFLEX MICROSCOPIC
Bilirubin Urine: NEGATIVE
Glucose, UA: NEGATIVE mg/dL
Hgb urine dipstick: NEGATIVE
Ketones, ur: NEGATIVE mg/dL
Leukocytes,Ua: NEGATIVE
Nitrite: NEGATIVE
Protein, ur: NEGATIVE mg/dL
Specific Gravity, Urine: 1.008 (ref 1.005–1.030)
pH: 5 (ref 5.0–8.0)

## 2021-09-06 LAB — GLUCOSE, CAPILLARY
Glucose-Capillary: 122 mg/dL — ABNORMAL HIGH (ref 70–99)
Glucose-Capillary: 106 mg/dL — ABNORMAL HIGH (ref 70–99)
Glucose-Capillary: 156 mg/dL — ABNORMAL HIGH (ref 70–99)
Glucose-Capillary: 177 mg/dL — ABNORMAL HIGH (ref 70–99)
Glucose-Capillary: 126 mg/dL — ABNORMAL HIGH (ref 70–99)
Glucose-Capillary: 130 mg/dL — ABNORMAL HIGH (ref 70–99)

## 2021-09-06 LAB — CULTURE, RESPIRATORY W GRAM STAIN: Culture: NO GROWTH

## 2021-09-06 LAB — POTASSIUM: Potassium: 6 mmol/L — ABNORMAL HIGH (ref 3.5–5.1)

## 2021-09-06 MED ORDER — SODIUM ZIRCONIUM CYCLOSILICATE 10 G PO PACK
10.0000 g | PACK | Freq: Once | ORAL | Status: AC
  Administered 2021-09-06: 10 g
  Filled 2021-09-06: qty 1

## 2021-09-06 MED ORDER — SODIUM CHLORIDE 0.9 % IV SOLN
2.0000 g | INTRAVENOUS | Status: AC
  Administered 2021-09-06: 2 g via INTRAVENOUS
  Filled 2021-09-06: qty 20

## 2021-09-06 MED ORDER — LACTATED RINGERS IV SOLN: INTRAVENOUS | Status: AC

## 2021-09-06 MED ORDER — SODIUM ZIRCONIUM CYCLOSILICATE 10 G PO PACK
10.0000 g | PACK | Freq: Three times a day (TID) | ORAL | Status: AC
  Administered 2021-09-06 – 2021-09-07 (×3): 10 g via ORAL
  Filled 2021-09-06 (×3): qty 1

## 2021-09-06 MED ORDER — VITAMIN D 25 MCG (1000 UNIT) PO TABS
4000.0000 [IU] | ORAL_TABLET | Freq: Every day | ORAL | Status: DC
  Administered 2021-09-06 – 2021-09-16 (×11): 4000 [IU]
  Filled 2021-09-06 (×11): qty 4

## 2021-09-06 NOTE — Progress Notes (Signed)
Pt flipped back to full support due to apnea. RT will continue to monitor.

## 2021-09-06 NOTE — TOC Progression Note (Signed)
Transition of Care [facility]) - Initial/Assessment Note    Patient Details  Name: Donna Curry MRN: [id number] Date of Birth: [birth year]  [facility] CM/SW Contact:    Milinda Antis, [place] Phone Number: 09/06/2021, 1:46 PM  Clinical Narrative:                  Patient presented for increasing shortness of breath.  CSW spoke with the patient's daughter, [name], who reports that the patient is from home with an aunt, who is a CNA, and a grandchild.  Per daughter, the patient has transportation to appointments via family members and has never received home health or SNF services.    Transition of Care Department [facility]) has reviewed patient and no TOC needs have been identified at this time. We will continue to monitor patient advancement through interdisciplinary progression rounds. If new patient transition needs arise, please place a TOC consult.      Patient Goals and CMS Choice        Expected Discharge Plan and Services                                                Prior Living Arrangements/Services                       Activities of Daily Living      Permission Sought/Granted                  Emotional Assessment              Admission diagnosis:  Hypoxia [R09.02] COPD exacerbation ([place]) [J44.1] Acute respiratory failure with hypoxia ([place]) [J96.01] Patient Active Problem List   Diagnosis Date Noted   Acute respiratory failure with hypoxia ([place]) 08/30/2021   Vitamin D deficiency 07/30/2021   Depression with anxiety 07/30/2021   Vaccine counseling 01/15/2021   COPD exacerbation ([place])    Hypoxia 09/22/2020   Protein-calorie malnutrition, severe 09/03/2018   HCAP (healthcare-associated pneumonia) 08/06/2018   Emphysema of lung ([place]) 08/06/2018   Acute on chronic respiratory failure with hypoxia ([place]) 08/06/2018   Iron deficiency anemia due to chronic blood loss 03/01/2018   Goals of care, counseling/discussion  02/09/2018   Bladder cancer ([place]) 12/13/2017   Hypertensive cardiomyopathy, 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[place]) 04/29/2016   Hypokalemia 04/02/2016   Protein-energy malnutrition ([place]) 04/02/2016   Hypertensive emergency 04/01/2016   Dizziness and giddiness 04/12/2013   Palpitations 03/27/2013   Weight loss 06/08/2012   Degenerative disc disease, lumbar 01/30/2012   Hypercalcemia 01/30/2012   Hyperproteinemia 01/30/2012   Borderline diabetes 01/30/2012   Normocytic anemia 03/11/2011   TOBACCO ABUSE 08/31/2009   Essential hypertension 08/31/2009   PCP:  [name], NP Pharmacy:   [facility], [place] [address] Phone: [phone] Fax: [phone]     Social Determinants of Health (SDOH) Interventions    Readmission Risk Interventions Readmission Risk Prevention Plan 09/06/2021 01/08/2021  Transportation Screening - Complete  PCP or Specialist Appt within 3-5 Days - Complete  HRI or [place] - Complete  Social Work Consult for [place] Planning/Counseling - Patient refused  Palliative Care Screening - Not Applicable  Medication Review Press photographer) Referral to Pharmacy Referral to Pharmacy  PCP or Specialist appointment  within 3-5 days of discharge Not Complete -  PCP/Specialist Appt Not Complete comments patient is still inpatient without discharge date -  [place] or Home Care Consult Complete -  SW Recovery Care/Counseling Consult Complete -  Palliative Care Screening Not Applicable -  [place] Not Applicable -  Some recent data might be hidden

## 2021-09-06 NOTE — Progress Notes (Signed)
Pt placed on PS/CPAP 5/5 on 30% and is tolerating well. RT will continue to monitor.

## 2021-09-06 NOTE — Progress Notes (Signed)
[facility] Progress Note Patient Name: Donna Curry DOB: [birth year] MRN: [id number]   Date of Service  09/06/2021  HPI/Events of Note  K is 5.6 this AM. Creatinine is 1.61  eICU Interventions  Lokelma x 1  Repeat K at 1 pm      Intervention Category Major Interventions: Electrolyte abnormality - evaluation and management  Margaretmary Lombard 09/06/2021, 6:47 AM

## 2021-09-06 NOTE — Progress Notes (Addendum)
NAME:  BREHANNA Curry, MRN:  [id number], DOB:  1955-11-04, LOS: 4 ADMISSION DATE:  09/07/2021, CONSULTATION DATE:  08/31/2021  REFERRING MD:  Dr. Dina Rich, CHIEF COMPLAINT:  COPD exacerbation    History of Present Illness:  [name] is a 66 year old female with a past medical history of chronic hypoxic respiratory failure 2/2 COPD on home supplemental oxygen (2L), bladder malignancy currently on Keytruda, iron deficiency anemia who presented to the ED with complaints of shortness of breath.  History limited 2/2 emergent intubation and sedation.  Per chart review, patient has been experiencing increasing shortness of breath for several days now.  She has been compliant with her home supplemental oxygen and nebulizer treatments.  On EMS arrival, she was hypoxic at 66% on home oxygen, with tachypnea and tripoding.  Patient was placed on CPAP with improvement in oxygenation up to 95%.  She was subsequently placed on BiPAP however due to worsening work of breathing, intubation was pursued.  Patient has received breathing treatments and high dose steroids.   Pertinent  Medical History   Past Medical History:  Diagnosis Date   Anemia    Asthma    Dyspnea    with exertion    Family history of adverse reaction to anesthesia    brother wakes up and dont know who he is and sister has n/v   Goals of care, counseling/discussion 02/09/2018   History of blood transfusion    Hypertension    Iron deficiency anemia due to chronic blood loss 03/01/2018   Measles as a child   Mumps as a child   TIA (transient ischemic attack)    Tobacco abuse    Significant Hospital Events: Including procedures, antibiotic start and stop dates in addition to other pertinent events   09/01/2021: Presented to the ED. Intubated. Transferred to ICU  Interim History / Subjective:   No acute overnight events. This AM, BMP notable for increased in creatinine to 1.6 with hyperkalemia 5.6. Lokelma ordered.   This AM,  patient opens her eyes to voice. Propofol currently at 30 and Fentanyl at 50.   Objective   Blood pressure 121/81, pulse 92, temperature 98.1 F (36.7 C), temperature source Core, resp. rate (!) 24, height 5\' 5"  (1.651 m), weight 28.3 kg, SpO2 95 %.    Vent Mode: PRVC FiO2 (%):  [30 %] 30 % Set Rate:  [24 bmp] 24 bmp Vt Set:  [450 mL] 450 mL PEEP:  [5 cmH20] 5 cmH20 Plateau Pressure:  [16 cmH20-25 cmH20] 17 cmH20   Intake/Output Summary (Last 24 hours) at 09/06/2021 0720 Last data filed at 09/06/2021 0600 Gross per 24 hour  Intake 1763.18 ml  Output 700 ml  Net 1063.18 ml    Filed Weights   09/04/21 0400 09/05/21 0500 09/06/21 0426  Weight: 31.2 kg 36 kg 28.3 kg   Examination: General: Cachectic woman, ventilated HENT: ET tube in place, pupils equal Lungs: Clear to auscultation in all lung fields. No wheezing.  Cardiovascular: Regular rate and rhythm, no murmur Abdomen: Nondistended, positive bowel sounds Extremities: No edema Neuro: Will turn head to voice. Remains sedated GU: Deferred  Resolved Hospital Problem list     Assessment & Plan:   # Multi-focal CAP # Acute on Chronic Hypoxic Respiratory Failure # History of COPD  CTA obtained on 2/10 with numerous tree-in-bud nodularities, consistent with multifocal pneumonia but some of this looks chronic. Given chronicity, concern for MAI. RVP negative. Heavy secretions. Completed 3 day course of Azithromycin (2/9 -  2/11).   - Continue PRVC - Daily SBT. Failed on 2/12 - Continue Propofol gtt and Fentanyl gtt - Bronchodilators as ordered - Discontinue Solumedrol - Start Prednisone  - Continue Ceftriaxone - Follow respiratory cultures including AFB given evidence for scattered micro and macro nodular disease on CT chest  # Acute on Chronic Normocytic Anemia # History of IDA Hemoglobin decreased from 13 to 9 on admission, however persistently ~ 9 since. Suspect anemia of chronic disease vs IDA.   - Follow CBC -  Follow for any evidence of active blood loss, none noted  # Acute Kidney Injury # Hyperkalemia  Gradual rise in creatinine from admission to 1.6 today. Concern for oliguria yesterday, s/p Lasix challenge. 700 cc UOP documented over last 24 hours however RN notes significant loss of urine into bed. Evidence of contraction alkalosis today. Renal ultrasound yesterday with bladder distention and mild-moderate hydronephrosis, however this may be a chronic finding. Etiology of AKI undetermined at this time, potentially pre-renal given hypernatremia vs post-renal given hydronephrosis. UA without evidence of casts.  - Insert foley catheter  - IVF resuscitations  - Strict in/out - Monitor creatinine  # Hypertension  - Continue Amlodipine  # Malignant Neoplasm of the Bladder  - On maintenance suppressive Keytruda every 2 months.  Managed by [name].  Will need to consider this regimen in light of possible mycobacterial disease.  Can likely be addressed as an outpatient  # Depression - Continue home Celexa - Diazepam available as needed  Best Practice (right click and "Reselect all SmartList Selections" daily)  Diet/type: NPO DVT prophylaxis: Lovenox GI prophylaxis: PPI Lines: N/A Foley:  N/A Code Status:  full code Last date of multidisciplinary goals of care discussion [Pending]  Labs   CBC: Recent Labs  Lab 09/01/2021 1220 09/15/2021 1601 09/03/21 0348 09/03/21 0840 09/03/21 1139 09/04/21 0324 09/05/21 0500 09/06/21 0158  WBC 9.5  --  9.2  --  11.3* 8.2 10.5 11.0*  NEUTROABS 7.8*  --   --   --  10.4*  --   --   --   HGB 13.3   < > 9.1* 9.9* 8.6* 9.2* 9.0* 9.2*  HCT 41.9   < > 27.8* 29.0* 27.5* 28.7* 28.1* 29.4*  MCV 99.1  --  95.5  --  97.5 98.0 98.6 100.0  PLT 207  --  189  --  211 211 208 220   < > = values in this interval not displayed.    Basic Metabolic Panel: Recent Labs  Lab 09/18/2021 1220 09/15/2021 1601 09/03/21 0348 09/03/21 0840 09/03/21 1650 09/04/21 0324  09/05/21 0500 09/06/21 0158  NA 140   < > 141 142  --  144 148* 147*  K 4.5   < > 3.6 5.3*  --  5.0 5.3* 5.6*  CL 97*  --  103  --   --  107 109 106  CO2 24  --  29  --   --  29 30 34*  GLUCOSE 150*  --  126*  --   --  169* 120* 132*  BUN 26*  --  22  --   --  34* 47* 54*  CREATININE 1.10*  --  0.88  --   --  1.08* 1.48* 1.61*  CALCIUM 9.7  --  8.8*  --   --  9.0 8.7* 8.4*  MG 3.7*  --  2.3  --  2.6* 2.6* 2.6*  --   PHOS  --   --   --   --  3.8 4.0 2.8  --    < > = values in this interval not displayed.    GFR: Estimated Creatinine Clearance: 15.6 mL/min (A) (by C-G formula based on SCr of 1.61 mg/dL (H)). Recent Labs  Lab 09/07/2021 1832 09/03/21 0348 09/03/21 1139 09/04/21 0324 09/05/21 0500 09/06/21 0158  PROCALCITON 16.12  --   --   --   --   --   WBC  --    < > 11.3* 8.2 10.5 11.0*   < > = values in this interval not displayed.     Liver Function Tests: Recent Labs  Lab 08/26/2021 1220  AST 28  ALT 17  ALKPHOS 89  BILITOT 1.4*  PROT 7.8  ALBUMIN 3.4*    No results for input(s): LIPASE, AMYLASE in the last 168 hours. No results for input(s): AMMONIA in the last 168 hours.  ABG    Component Value Date/Time   PHART 7.330 (L) 09/03/2021 0840   PCO2ART 58.8 (H) 09/03/2021 0840   PO2ART 99 09/03/2021 0840   HCO3 31.2 (H) 09/03/2021 0840   TCO2 33 (H) 09/03/2021 0840   O2SAT 97.0 09/03/2021 0840     Coagulation Profile: No results for input(s): INR, PROTIME in the last 168 hours.  Cardiac Enzymes: No results for input(s): CKTOTAL, CKMB, CKMBINDEX, TROPONINI in the last 168 hours.  HbA1C: Hgb A1c MFr Bld  Date/Time Value Ref Range Status  08/28/2021 06:32 PM 5.3 4.8 - 5.6 % Final    Comment:    (NOTE) Pre diabetes:          5.7%-6.4%  Diabetes:              >6.4%  Glycemic control for   <7.0% adults with diabetes   12/04/2017 10:40 AM 4.5 (L) 4.6 - 6.5 % Final    Comment:    Glycemic Control Guidelines for People with Diabetes:Non Diabetic:   <6%Goal of Therapy: <7%Additional Action Suggested:  >8%     CBG: Recent Labs  Lab 09/05/21 1128 09/05/21 1542 09/05/21 1921 09/05/21 2348 09/06/21 0317  GLUCAP 120* 165* 140* 135* 122*    Dr. Jose Persia Internal Medicine PGY-3  09/06/2021, 9:38 AM

## 2021-09-06 NOTE — Progress Notes (Signed)
Brief Nutrition Note  Discussed pt with RN and during ICU rounds. Tube feeds running at goal rate and pt tolerating well. Vitamin D lab came back low at 27.15. Discussed with Pharmacy who has ordered cholecalciferol 4000 units daily to replace low vitamin D. Will continue to follow pt during admission.   Gustavus Bryant, MS, RD, LDN Inpatient Clinical Dietitian Please see AMiON for contact information.

## 2021-09-07 DIAGNOSIS — J9601 Acute respiratory failure with hypoxia: Secondary | ICD-10-CM | POA: Diagnosis not present

## 2021-09-07 DIAGNOSIS — N170 Acute kidney failure with tubular necrosis: Secondary | ICD-10-CM | POA: Diagnosis not present

## 2021-09-07 DIAGNOSIS — J441 Chronic obstructive pulmonary disease with (acute) exacerbation: Secondary | ICD-10-CM | POA: Diagnosis not present

## 2021-09-07 LAB — BASIC METABOLIC PANEL
Anion gap: 8 (ref 5–15)
BUN: 52 mg/dL — ABNORMAL HIGH (ref 8–23)
CO2: 36 mmol/L — ABNORMAL HIGH (ref 22–32)
Calcium: 8.6 mg/dL — ABNORMAL LOW (ref 8.9–10.3)
Chloride: 105 mmol/L (ref 98–111)
Creatinine, Ser: 1.62 mg/dL — ABNORMAL HIGH (ref 0.44–1.00)
GFR, Estimated: 35 mL/min — ABNORMAL LOW (ref >60)
Glucose, Bld: 113 mg/dL — ABNORMAL HIGH (ref 70–99)
Potassium: 5.7 mmol/L — ABNORMAL HIGH (ref 3.5–5.1)
Sodium: 149 mmol/L — ABNORMAL HIGH (ref 135–145)

## 2021-09-07 LAB — CBC
HCT: 28.6 % — ABNORMAL LOW (ref 36.0–46.0)
Hemoglobin: 8.6 g/dL — ABNORMAL LOW (ref 12.0–15.0)
MCH: 30.6 pg (ref 26.0–34.0)
MCHC: 30.1 g/dL (ref 30.0–36.0)
MCV: 101.8 fL — ABNORMAL HIGH (ref 80.0–100.0)
Platelets: 222 10*3/uL (ref 150–400)
RBC: 2.81 MIL/uL — ABNORMAL LOW (ref 3.87–5.11)
RDW: 18.5 % — ABNORMAL HIGH (ref 11.5–15.5)
WBC: 8.4 10*3/uL (ref 4.0–10.5)
nRBC: 0.2 % (ref 0.0–0.2)

## 2021-09-07 LAB — POCT I-STAT 7, (LYTES, BLD GAS, ICA,H+H)
Acid-Base Excess: 11 mmol/L — ABNORMAL HIGH (ref 0.0–2.0)
Bicarbonate: 38.4 mmol/L — ABNORMAL HIGH (ref 20.0–28.0)
Calcium, Ion: 1.2 mmol/L (ref 1.15–1.40)
HCT: 30 % — ABNORMAL LOW (ref 36.0–46.0)
Hemoglobin: 10.2 g/dL — ABNORMAL LOW (ref 12.0–15.0)
O2 Saturation: 90 %
Patient temperature: 99.2
Potassium: 5.3 mmol/L — ABNORMAL HIGH (ref 3.5–5.1)
Sodium: 145 mmol/L (ref 135–145)
TCO2: 40 mmol/L — ABNORMAL HIGH (ref 22–32)
pCO2 arterial: 71 mmHg (ref 32–48)
pH, Arterial: 7.342 — ABNORMAL LOW (ref 7.35–7.45)
pO2, Arterial: 65 mmHg — ABNORMAL LOW (ref 83–108)

## 2021-09-07 LAB — VITAMIN C: Vitamin C: 1.1 mg/dL (ref 0.4–2.0)

## 2021-09-07 LAB — COPPER, SERUM: Copper: 136 ug/dL (ref 80–158)

## 2021-09-07 LAB — GLUCOSE, CAPILLARY
Glucose-Capillary: 131 mg/dL — ABNORMAL HIGH (ref 70–99)
Glucose-Capillary: 121 mg/dL — ABNORMAL HIGH (ref 70–99)
Glucose-Capillary: 191 mg/dL — ABNORMAL HIGH (ref 70–99)
Glucose-Capillary: 132 mg/dL — ABNORMAL HIGH (ref 70–99)
Glucose-Capillary: 132 mg/dL — ABNORMAL HIGH (ref 70–99)
Glucose-Capillary: 124 mg/dL — ABNORMAL HIGH (ref 70–99)

## 2021-09-07 LAB — ZINC: Zinc: 42 ug/dL — ABNORMAL LOW (ref 44–115)

## 2021-09-07 MED ORDER — FENTANYL 2500MCG IN NS 250ML (10MCG/ML) PREMIX INFUSION
25.0000 ug/h | INTRAVENOUS | Status: DC
  Administered 2021-09-07 – 2021-09-08 (×2): 300 ug/h via INTRAVENOUS
  Administered 2021-09-08 – 2021-09-09 (×3): 400 ug/h via INTRAVENOUS
  Administered 2021-09-09 (×2): 200 ug/h via INTRAVENOUS
  Administered 2021-09-10: 50 ug/h via INTRAVENOUS
  Administered 2021-09-11: 175 ug/h via INTRAVENOUS
  Administered 2021-09-11: 100 ug/h via INTRAVENOUS
  Administered 2021-09-12: 200 ug/h via INTRAVENOUS
  Administered 2021-09-12: 250 ug/h via INTRAVENOUS
  Administered 2021-09-13 – 2021-09-15 (×8): 200 ug/h via INTRAVENOUS
  Filled 2021-09-07 (×23): qty 250

## 2021-09-07 MED ORDER — NOREPINEPHRINE 4 MG/250ML-% IV SOLN
INTRAVENOUS | Status: AC
  Administered 2021-09-07: 6 ug/min via INTRAVENOUS
  Filled 2021-09-07: qty 250

## 2021-09-07 MED ORDER — VECURONIUM BROMIDE 10 MG IV SOLR
7.0000 mg | Freq: Once | INTRAVENOUS | Status: AC
  Administered 2021-09-07: 7 mg via INTRAVENOUS
  Filled 2021-09-07: qty 10

## 2021-09-07 MED ORDER — NOREPINEPHRINE 4 MG/250ML-% IV SOLN: 0.0000 ug/min | INTRAVENOUS | Status: DC

## 2021-09-07 MED ORDER — FENTANYL CITRATE (PF) 100 MCG/2ML IJ SOLN: 25.0000 ug | Freq: Once | INTRAMUSCULAR | Status: DC

## 2021-09-07 MED ORDER — FREE WATER
300.0000 mL | Freq: Four times a day (QID) | Status: DC
  Administered 2021-09-07 – 2021-09-08 (×4): 300 mL

## 2021-09-07 MED ORDER — NOREPINEPHRINE 4 MG/250ML-% IV SOLN: 2.0000 ug/min | INTRAVENOUS | Status: DC

## 2021-09-07 MED ORDER — NOREPINEPHRINE 4 MG/250ML-% IV SOLN
0.0000 ug/min | INTRAVENOUS | Status: DC
  Administered 2021-09-07: 2 ug/min via INTRAVENOUS

## 2021-09-07 MED ORDER — PREDNISONE 20 MG PO TABS
40.0000 mg | ORAL_TABLET | Freq: Every day | ORAL | Status: DC
  Administered 2021-09-08: 40 mg
  Filled 2021-09-07: qty 2

## 2021-09-07 MED ORDER — SODIUM CHLORIDE 0.9 % IV SOLN: 250.0000 mL | INTRAVENOUS | Status: DC

## 2021-09-07 MED ORDER — FREE WATER
200.0000 mL | Freq: Four times a day (QID) | Status: DC
  Administered 2021-09-07: 200 mL

## 2021-09-07 MED ORDER — PREDNISONE 20 MG PO TABS
40.0000 mg | ORAL_TABLET | Freq: Every day | ORAL | Status: DC
  Administered 2021-09-07: 40 mg via ORAL
  Filled 2021-09-07: qty 2

## 2021-09-07 NOTE — Progress Notes (Signed)
[place] Progress Note Patient Name: Donna Curry DOB: 08-22-1955 MRN: [id number]   Date of Service  09/07/2021  HPI/Events of Note  Hypotension - Norepinephrine IV infusion restarted. No order for Norepinephrine IV infusion.  eICU Interventions  Plan: Norepinephrine IV infusion. Titrate to MAP >= 65. D/C Amlodipine per tube.     Intervention Category Major Interventions: Hypotension - evaluation and management  Lysle Dingwall 09/07/2021, 7:55 PM

## 2021-09-07 NOTE — Care Plan (Signed)
GOALS OF CARE DISCUSSION   The Clinical status was relayed to daughter and patient's sisters at bedside in detail.   Updated and notified of patients medical condition.     Patient remains unresponsive and will not open eyes to command.   Patient with increased WOB and using accessory muscles to breathe Explained to family course of therapy and the modalities  Signs of brain damage Evidence of kidney failure    Patient with Progressive multiorgan failure with a very high probablity of a very minimal chance of meaningful recovery despite all aggressive and optimal medical therapy.  Per patient's family wishes she was made DNR, orders were written, will continue with full scope of care while keeping her DNR.  Patient's daughter would like to meet again on Thursday to discuss further plan of care      Family are satisfied with Plan of action and management. All questions answered     Jacky Kindle MD [facility] See Amion for pager If no response to pager, please call [phone] until 7pm After 7pm, Please call E-link [phone]

## 2021-09-07 NOTE — Progress Notes (Signed)
Pt placed on PS/CPAP 5/5 on 30% and is tolerating well. RT will continue to monitor.

## 2021-09-07 NOTE — Progress Notes (Signed)
Pt flipped back to full support due to low sp02 and increased WOB. RT will continue to monitor.

## 2021-09-07 NOTE — Progress Notes (Signed)
Pt placed on PSV  , Pt tolerating well at this time, RN aware, RT will continue to monitor.    09/07/21 1150  Adult Ventilator Settings  Vent Mode (S)  PSV;CPAP  FiO2 (%) 40 %  Pressure Support (S)  5 cmH20 (5 above peep)  PEEP 5 cmH20

## 2021-09-07 NOTE — Progress Notes (Addendum)
NAME:  Donna Curry, MRN:  [id number], DOB:  1955/09/07, LOS: 5 ADMISSION DATE:  09/18/2021, CONSULTATION DATE:  08/27/2021  REFERRING MD:  Dr. Dina Rich, CHIEF COMPLAINT:  COPD exacerbation    History of Present Illness:  [name] is a 66 year old female with a past medical history of chronic hypoxic respiratory failure 2/2 COPD on home supplemental oxygen (2L), bladder malignancy currently on Keytruda, iron deficiency anemia who presented to the ED with complaints of shortness of breath.  History limited 2/2 emergent intubation and sedation.  Per chart review, patient has been experiencing increasing shortness of breath for several days now.  She has been compliant with her home supplemental oxygen and nebulizer treatments.  On EMS arrival, she was hypoxic at 66% on home oxygen, with tachypnea and tripoding.  Patient was placed on CPAP with improvement in oxygenation up to 95%.  She was subsequently placed on BiPAP however due to worsening work of breathing, intubation was pursued.  Patient has received breathing treatments and high dose steroids.   Pertinent  Medical History   Past Medical History:  Diagnosis Date   Anemia    Asthma    Dyspnea    with exertion    Family history of adverse reaction to anesthesia    brother wakes up and dont know who he is and sister has n/v   Goals of care, counseling/discussion 02/09/2018   History of blood transfusion    Hypertension    Iron deficiency anemia due to chronic blood loss 03/01/2018   Measles as a child   Mumps as a child   TIA (transient ischemic attack)    Tobacco abuse    Significant Hospital Events: Including procedures, antibiotic start and stop dates in addition to other pertinent events   09/18/2021: Presented to the ED. Intubated. Transferred to ICU  Interim History / Subjective:   No acute overnight events. Failed SBT yesterday morning.   This AM, propofol and fentanyl gtt weaned to off. Patient placed in pressure  support mode. After 10 minutes, she desatted to the low 80s and RT switched back to full vent support.   Objective   Blood pressure (!) 139/97, pulse 90, temperature 99.2 F (37.3 C), temperature source Oral, resp. rate (!) 21, height 5\' 5"  (1.651 m), weight 35.4 kg, SpO2 92 %.    Vent Mode: PRVC FiO2 (%):  [30 %] 30 % Set Rate:  [24 bmp] 24 bmp Vt Set:  [450 mL] 450 mL PEEP:  [5 cmH20] 5 cmH20 Plateau Pressure:  [14 cmH20-24 cmH20] 24 cmH20   Intake/Output Summary (Last 24 hours) at 09/07/2021 0737 Last data filed at 09/07/2021 0700 Gross per 24 hour  Intake 2250.61 ml  Output 3320 ml  Net -1069.39 ml    Filed Weights   09/04/21 0400 09/05/21 0500 09/07/21 0421  Weight: 31.2 kg 36 kg 35.4 kg   Examination: General: Cachectic woman, ventilated. No acute distress HENT: ET tube in place, pupils equal  Lungs: Bilateral wheezing present. No rhonchi or rales.  Cardiovascular: Regular rate and rhythm, no murmur. 2+ DP pulses Abdomen: Nondistended, positive bowel sounds Extremities: No edema Neuro: Does not track or follow commands. Withdraws to noxious stimuli.  GU: Deferred  Resolved Hospital Problem list     Assessment & Plan:   # Multi-focal CAP # Acute on Chronic Hypoxic Respiratory Failure # History of COPD  Completed 3 day course of Azithromycin (2/9 - 2/11) and 5 day course of Ceftriaxone (2/9 - 2-13). Respiratory  cultures NGTD x 2 days.   - Difficulty with SBT due to severe underlying COPD and anxiety. Will need to discuss potential next steps with family - Continue PRVC - Continue Propofol gtt and Fentanyl gtt - Bronchodilators as ordered - Continue Prednisone, Day 6 - Follow respiratory AFB cultures given evidence for scattered micro and macro nodular disease on CT chest  # Acute Kidney Injury # Hyperkalemia  Etiology uncertain, pre-renal due to insensible losses versus post-renal given hydronephrosis on imaging (however it may be chronic given bladder cancer).  Foley placed yesterday with creatinine beginning to improve today  - Continue foley catheter  - Strict in/out - Monitor creatinine  # Acute on Chronic Normocytic Anemia # History of IDA Hemoglobin decreased from 13 to 9 on admission, however persistently ~ 9 since. Suspect anemia of chronic disease vs IDA. Hemoglobin stable today.   - Follow CBC - Follow for any evidence of active blood loss, none noted  # Hypernatremia - Add free water to tube feeds, 200 cc q6h for 24 hours   # Hypertension  - Continue Amlodipine  # Malignant Neoplasm of the Bladder  - On maintenance suppressive Keytruda every 2 months.  Managed by [name].  Will need to consider this regimen in light of possible mycobacterial disease.  Can likely be addressed as an outpatient  # Depression - Continue home Celexa - Diazepam available as needed  Best Practice (right click and "Reselect all SmartList Selections" daily)  Diet/type: NPO DVT prophylaxis: Lovenox GI prophylaxis: PPI Lines: N/A Foley:  N/A Code Status:  full code Last date of multidisciplinary goals of care discussion [Pending]  Labs   CBC: Recent Labs  Lab 08/26/2021 1220 09/16/2021 1601 09/03/21 1139 09/04/21 0324 09/05/21 0500 09/06/21 0158 09/07/21 0030  WBC 9.5   < > 11.3* 8.2 10.5 11.0* 8.4  NEUTROABS 7.8*  --  10.4*  --   --   --   --   HGB 13.3   < > 8.6* 9.2* 9.0* 9.2* 8.6*  HCT 41.9   < > 27.5* 28.7* 28.1* 29.4* 28.6*  MCV 99.1   < > 97.5 98.0 98.6 100.0 101.8*  PLT 207   < > 211 211 208 220 222   < > = values in this interval not displayed.    Basic Metabolic Panel: Recent Labs  Lab 09/08/2021 1220 08/28/2021 1601 09/03/21 0348 09/03/21 0840 09/03/21 1650 09/04/21 0324 09/05/21 0500 09/06/21 0158 09/06/21 1237 09/06/21 1535 09/07/21 0030  NA 140   < > 141   < >  --  144 148* 147*  --  150* 149*  K 4.5   < > 3.6   < >  --  5.0 5.3* 5.6* 6.0* 6.1* 5.7*  CL 97*  --  103  --   --  107 109 106  --  107 105  CO2 24   --  29  --   --  29 30 34*  --  34* 36*  GLUCOSE 150*  --  126*  --   --  169* 120* 132*  --  159* 113*  BUN 26*  --  22  --   --  34* 47* 54*  --  59* 52*  CREATININE 1.10*  --  0.88  --   --  1.08* 1.48* 1.61*  --  2.10* 1.62*  CALCIUM 9.7  --  8.8*  --   --  9.0 8.7* 8.4*  --  8.7* 8.6*  MG 3.7*  --  2.3  --  2.6* 2.6* 2.6*  --   --   --   --   PHOS  --   --   --   --  3.8 4.0 2.8  --   --   --   --    < > = values in this interval not displayed.    GFR: Estimated Creatinine Clearance: 19.3 mL/min (A) (by C-G formula based on SCr of 1.62 mg/dL (H)). Recent Labs  Lab 09/16/2021 1832 09/03/21 0348 09/04/21 0324 09/05/21 0500 09/06/21 0158 09/07/21 0030  PROCALCITON 16.12  --   --   --   --   --   WBC  --    < > 8.2 10.5 11.0* 8.4   < > = values in this interval not displayed.     Liver Function Tests: Recent Labs  Lab 09/10/2021 1220  AST 28  ALT 17  ALKPHOS 89  BILITOT 1.4*  PROT 7.8  ALBUMIN 3.4*    No results for input(s): LIPASE, AMYLASE in the last 168 hours. No results for input(s): AMMONIA in the last 168 hours.  ABG    Component Value Date/Time   PHART 7.330 (L) 09/03/2021 0840   PCO2ART 58.8 (H) 09/03/2021 0840   PO2ART 99 09/03/2021 0840   HCO3 31.2 (H) 09/03/2021 0840   TCO2 33 (H) 09/03/2021 0840   O2SAT 97.0 09/03/2021 0840     Coagulation Profile: No results for input(s): INR, PROTIME in the last 168 hours.  Cardiac Enzymes: No results for input(s): CKTOTAL, CKMB, CKMBINDEX, TROPONINI in the last 168 hours.  HbA1C: Hgb A1c MFr Bld  Date/Time Value Ref Range Status  09/01/2021 06:32 PM 5.3 4.8 - 5.6 % Final    Comment:    (NOTE) Pre diabetes:          5.7%-6.4%  Diabetes:              >6.4%  Glycemic control for   <7.0% adults with diabetes   12/04/2017 10:40 AM 4.5 (L) 4.6 - 6.5 % Final    Comment:    Glycemic Control Guidelines for People with Diabetes:Non Diabetic:  <6%Goal of Therapy: <7%Additional Action Suggested:  >8%      CBG: Recent Labs  Lab 09/06/21 1614 09/06/21 1930 09/06/21 2320 09/07/21 0328 09/07/21 0712  GLUCAP 177* 126* 130* 131* 121*    Dr. Jose Persia Internal Medicine PGY-3  09/07/2021, 7:37 AM

## 2021-09-08 ENCOUNTER — Inpatient Hospital Stay (HOSPITAL_COMMUNITY): Payer: Medicare Other

## 2021-09-08 DIAGNOSIS — N170 Acute kidney failure with tubular necrosis: Secondary | ICD-10-CM | POA: Diagnosis not present

## 2021-09-08 DIAGNOSIS — J9601 Acute respiratory failure with hypoxia: Secondary | ICD-10-CM | POA: Diagnosis not present

## 2021-09-08 DIAGNOSIS — J441 Chronic obstructive pulmonary disease with (acute) exacerbation: Secondary | ICD-10-CM | POA: Diagnosis not present

## 2021-09-08 LAB — BASIC METABOLIC PANEL
Anion gap: 6 (ref 5–15)
Anion gap: 15 (ref 5–15)
BUN: 46 mg/dL — ABNORMAL HIGH (ref 8–23)
BUN: 56 mg/dL — ABNORMAL HIGH (ref 8–23)
CO2: 35 mmol/L — ABNORMAL HIGH (ref 22–32)
CO2: 27 mmol/L (ref 22–32)
Calcium: 8.5 mg/dL — ABNORMAL LOW (ref 8.9–10.3)
Calcium: 8.9 mg/dL (ref 8.9–10.3)
Chloride: 103 mmol/L (ref 98–111)
Chloride: 99 mmol/L (ref 98–111)
Creatinine, Ser: 1.28 mg/dL — ABNORMAL HIGH (ref 0.44–1.00)
Creatinine, Ser: 1.84 mg/dL — ABNORMAL HIGH (ref 0.44–1.00)
GFR, Estimated: 46 mL/min — ABNORMAL LOW (ref >60)
GFR, Estimated: 30 mL/min — ABNORMAL LOW (ref >60)
Glucose, Bld: 124 mg/dL — ABNORMAL HIGH (ref 70–99)
Glucose, Bld: 116 mg/dL — ABNORMAL HIGH (ref 70–99)
Potassium: 6.3 mmol/L (ref 3.5–5.1)
Potassium: >7.5 mmol/L (ref 3.5–5.1)
Sodium: 144 mmol/L (ref 135–145)
Sodium: 141 mmol/L (ref 135–145)

## 2021-09-08 LAB — CBC WITH DIFFERENTIAL/PLATELET
Abs Immature Granulocytes: 0.27 10*3/uL — ABNORMAL HIGH (ref 0.00–0.07)
Basophils Absolute: 0 10*3/uL (ref 0.0–0.1)
Basophils Relative: 0 %
Eosinophils Absolute: 0.1 10*3/uL (ref 0.0–0.5)
Eosinophils Relative: 1 %
HCT: 27.9 % — ABNORMAL LOW (ref 36.0–46.0)
Hemoglobin: 8.7 g/dL — ABNORMAL LOW (ref 12.0–15.0)
Immature Granulocytes: 4 %
Lymphocytes Relative: 13 %
Lymphs Abs: 0.9 10*3/uL (ref 0.7–4.0)
MCH: 31 pg (ref 26.0–34.0)
MCHC: 31.2 g/dL (ref 30.0–36.0)
MCV: 99.3 fL (ref 80.0–100.0)
Monocytes Absolute: 0.6 10*3/uL (ref 0.1–1.0)
Monocytes Relative: 9 %
Neutro Abs: 4.8 10*3/uL (ref 1.7–7.7)
Neutrophils Relative %: 73 %
Platelets: 211 10*3/uL (ref 150–400)
RBC: 2.81 MIL/uL — ABNORMAL LOW (ref 3.87–5.11)
RDW: 18 % — ABNORMAL HIGH (ref 11.5–15.5)
WBC: 6.6 10*3/uL (ref 4.0–10.5)
nRBC: 0 % (ref 0.0–0.2)

## 2021-09-08 LAB — GLUCOSE, CAPILLARY
Glucose-Capillary: 119 mg/dL — ABNORMAL HIGH (ref 70–99)
Glucose-Capillary: 127 mg/dL — ABNORMAL HIGH (ref 70–99)
Glucose-Capillary: 145 mg/dL — ABNORMAL HIGH (ref 70–99)
Glucose-Capillary: 161 mg/dL — ABNORMAL HIGH (ref 70–99)
Glucose-Capillary: 175 mg/dL — ABNORMAL HIGH (ref 70–99)
Glucose-Capillary: 255 mg/dL — ABNORMAL HIGH (ref 70–99)

## 2021-09-08 LAB — BLOOD GAS, ARTERIAL
Acid-Base Excess: UNDETERMINED mmol/L (ref 0.0–2.0)
Bicarbonate: UNDETERMINED mmol/L (ref 20.0–28.0)
Drawn by: 51555
FIO2: 100 %
O2 Saturation: 100 %
Patient temperature: 36.7
pCO2 arterial: UNDETERMINED mmHg (ref 32–48)
pH, Arterial: 6.97 — CL (ref 7.35–7.45)
pO2, Arterial: 333 mmHg — ABNORMAL HIGH (ref 83–108)

## 2021-09-08 LAB — POCT I-STAT 7, (LYTES, BLD GAS, ICA,H+H)
Acid-Base Excess: 12 mmol/L — ABNORMAL HIGH (ref 0.0–2.0)
Bicarbonate: 38.1 mmol/L — ABNORMAL HIGH (ref 20.0–28.0)
Calcium, Ion: 1.19 mmol/L (ref 1.15–1.40)
HCT: 32 % — ABNORMAL LOW (ref 36.0–46.0)
Hemoglobin: 10.9 g/dL — ABNORMAL LOW (ref 12.0–15.0)
O2 Saturation: 97 %
Patient temperature: 99.1
Potassium: 6.1 mmol/L — ABNORMAL HIGH (ref 3.5–5.1)
Sodium: 143 mmol/L (ref 135–145)
TCO2: 40 mmol/L — ABNORMAL HIGH (ref 22–32)
pCO2 arterial: 60.8 mmHg — ABNORMAL HIGH (ref 32–48)
pH, Arterial: 7.407 (ref 7.35–7.45)
pO2, Arterial: 91 mmHg (ref 83–108)

## 2021-09-08 LAB — LACTIC ACID, PLASMA: Lactic Acid, Venous: 1 mmol/L (ref 0.5–1.9)

## 2021-09-08 LAB — TRIGLYCERIDES: Triglycerides: 77 mg/dL (ref <150)

## 2021-09-08 MED ORDER — SODIUM ZIRCONIUM CYCLOSILICATE 10 G PO PACK
10.0000 g | PACK | Freq: Three times a day (TID) | ORAL | Status: AC
  Administered 2021-09-08 (×3): 10 g
  Filled 2021-09-08 (×3): qty 1

## 2021-09-08 MED ORDER — VITAL AF 1.2 CAL PO LIQD
1000.0000 mL | ORAL | Status: DC
  Administered 2021-09-08 – 2021-09-16 (×9): 1000 mL
  Filled 2021-09-08 (×2): qty 1000

## 2021-09-08 MED ORDER — SODIUM BICARBONATE 8.4 % IV SOLN
INTRAVENOUS | Status: AC
Start: 2021-09-08 — End: 2021-09-08
  Filled 2021-09-08: qty 50

## 2021-09-08 MED ORDER — INSULIN ASPART 100 UNIT/ML IV SOLN
10.0000 [IU] | Freq: Once | INTRAVENOUS | Status: AC
  Administered 2021-09-08: 10 [IU] via INTRAVENOUS

## 2021-09-08 MED ORDER — METHYLPREDNISOLONE SODIUM SUCC 125 MG IJ SOLR
80.0000 mg | Freq: Two times a day (BID) | INTRAMUSCULAR | Status: DC
  Administered 2021-09-08 – 2021-09-10 (×4): 80 mg via INTRAVENOUS
  Filled 2021-09-08 (×4): qty 2

## 2021-09-08 MED ORDER — CALCIUM GLUCONATE 10 % IV SOLN
1.0000 g | Freq: Once | INTRAVENOUS | Status: AC
  Administered 2021-09-08: 1 g via INTRAVENOUS
  Filled 2021-09-08: qty 10

## 2021-09-08 MED ORDER — METHYLPREDNISOLONE SODIUM SUCC 40 MG IJ SOLR: 40.0000 mg | Freq: Four times a day (QID) | INTRAMUSCULAR | Status: DC

## 2021-09-08 MED ORDER — ALBUTEROL SULFATE (2.5 MG/3ML) 0.083% IN NEBU
2.5000 mg | INHALATION_SOLUTION | RESPIRATORY_TRACT | Status: DC | PRN
  Administered 2021-09-14 – 2021-09-16 (×2): 2.5 mg via RESPIRATORY_TRACT
  Filled 2021-09-08 (×2): qty 3

## 2021-09-08 MED ORDER — SODIUM BICARBONATE 8.4 % IV SOLN
INTRAVENOUS | Status: AC
  Filled 2021-09-08: qty 150

## 2021-09-08 MED ORDER — SODIUM ZIRCONIUM CYCLOSILICATE 10 G PO PACK
10.0000 g | PACK | Freq: Once | ORAL | Status: AC
  Administered 2021-09-08: 10 g
  Filled 2021-09-08: qty 1

## 2021-09-08 MED ORDER — SODIUM BICARBONATE 8.4 % IV SOLN
INTRAVENOUS | Status: AC
  Administered 2021-09-08: 200 meq via INTRAVENOUS
  Filled 2021-09-08: qty 50

## 2021-09-08 MED ORDER — SODIUM ZIRCONIUM CYCLOSILICATE 10 G PO PACK: 10.0000 g | PACK | Freq: Three times a day (TID) | ORAL | Status: DC

## 2021-09-08 MED ORDER — ROCURONIUM BROMIDE 10 MG/ML (PF) SYRINGE
1.0000 mg/kg | PREFILLED_SYRINGE | Freq: Once | INTRAVENOUS | Status: AC
  Administered 2021-09-08: 40 mg via INTRAVENOUS
  Filled 2021-09-08: qty 10

## 2021-09-08 MED ORDER — DEXTROSE 50 % IV SOLN: 1.0000 | Freq: Once | INTRAVENOUS | Status: AC

## 2021-09-08 MED ORDER — CALCIUM CHLORIDE 10 % IV SOLN
INTRAVENOUS | Status: AC
  Filled 2021-09-08: qty 10

## 2021-09-08 MED ORDER — FREE WATER
100.0000 mL | Freq: Four times a day (QID) | Status: DC
  Administered 2021-09-08 – 2021-09-14 (×24): 100 mL

## 2021-09-08 MED ORDER — CALCIUM GLUCONATE-NACL 1-0.675 GM/50ML-% IV SOLN
1.0000 g | Freq: Once | INTRAVENOUS | Status: AC
  Administered 2021-09-08: 1000 mg via INTRAVENOUS
  Filled 2021-09-08: qty 50

## 2021-09-08 MED ORDER — VECURONIUM BROMIDE 10 MG IV SOLR
0.2000 mg/kg | Freq: Once | INTRAVENOUS | Status: AC
  Administered 2021-09-08: 8 mg via INTRAVENOUS
  Filled 2021-09-08: qty 10

## 2021-09-08 MED ORDER — ZINC SULFATE 220 (50 ZN) MG PO CAPS
220.0000 mg | ORAL_CAPSULE | Freq: Two times a day (BID) | ORAL | Status: DC
  Administered 2021-09-08 – 2021-09-16 (×18): 220 mg
  Filled 2021-09-08 (×18): qty 1

## 2021-09-08 MED ORDER — STERILE WATER FOR INJECTION IV SOLN
INTRAVENOUS | Status: DC
  Filled 2021-09-08 (×4): qty 1000

## 2021-09-08 MED ORDER — SODIUM BICARBONATE 8.4 % IV SOLN: 200.0000 meq | Freq: Once | INTRAVENOUS | Status: AC

## 2021-09-08 MED ORDER — NOREPINEPHRINE 16 MG/250ML-% IV SOLN
0.0000 ug/min | INTRAVENOUS | Status: DC
  Administered 2021-09-08: 10 ug/min via INTRAVENOUS
  Filled 2021-09-08: qty 250

## 2021-09-08 MED ORDER — DEXTROSE 50 % IV SOLN
INTRAVENOUS | Status: AC
Start: 2021-09-08 — End: 2021-09-08
  Administered 2021-09-08: 50 mL via INTRAVENOUS
  Filled 2021-09-08: qty 50

## 2021-09-08 NOTE — Progress Notes (Signed)
PCCM interval progress note:  Pt with worsening clinical status overnight, see elink interventions.  Vent adjustments made by Dr. Tamala Julian. Discussed with pt's nephew at the bedside that we are at maximal support and patient is worsening, advised that patient's daughter come in.  Confirmed DNR.    Otilio Carpen Breland Elders, PA-C

## 2021-09-08 NOTE — Progress Notes (Signed)
Nutrition Follow-up  DOCUMENTATION CODES:   Underweight, Severe malnutrition in context of chronic illness  INTERVENTION:   Continue tube feeds via Cortrak: - Change to Vital AF 1.2 @ 45 ml/hr (1080 ml/day) - Free water flushes per CCM, currently 100 ml q 6 hours  Tube feeding regimen provides 1296 kcal, 81 grams of protein, and 876 ml of H2O (provides 1544 total kcal with current propofol).  Tube feeding regimen provides 1777 mg potassium over 24 hours.  Total free water with flushes: 1276 ml  - Continue cholecalciferol 4000 units daily to replete low vitamin D  - Add zinc sulfate 220 mg BID x 14 days to replete low zinc  - Continue MVI with minerals daily per tube  NUTRITION DIAGNOSIS:   Severe Malnutrition related to chronic illness (COPD, bladder cancer) as evidenced by severe fat depletion, severe muscle depletion.  Ongoing, being addressed via TF  GOAL:   Patient will meet greater than or equal to 90% of their needs  Met via TF  MONITOR:   Vent status, Labs, Weight trends, TF tolerance  REASON FOR ASSESSMENT:   Ventilator, Consult Assessment of nutrition requirement/status  ASSESSMENT:   66 year old female who presented to the ED on 2/09 with SOB x 3-4 days. Pt required intubation in the ED. PMH of COPD, bladder cancer on Keytruda, anemia. Pt admitted with acute on chronic hypoxic respiratory failure.  02/10 - Cortrak placed (tip gastric)  Discussed pt with RN and during ICU rounds. Request was made for RD to adjust tube feeding regimen to provide less potassium. Will change to Vital AF 1.2 tube feeding formula which provides ~1000 mg less potassium over 24 hours and monitor labs. Family is discussing trach vs comfort care.  Discussed supplementing low zinc with CCM who agreed.  Admit weight: 33.7 kg Current weight: 40 kg  Current tube feeds: Osmolite 1.2 @ 50 ml/hr, free water flushes 100 ml q 6 hours  Patient remains intubated on ventilator  support MV: 11 L/min Temp (24hrs), Avg:98.5 F (36.9 C), Min:97.7 F (36.5 C), Max:99.1 F (37.3 C)  Drips: Propofol: 9.4 ml/hr (provides 248 kcal daily from lipid) Fentanyl  Medications reviewed and include: cholecalciferol 4000 units daily, colace, SSI q 4 hours, MVI with minerals daily, protonix, miralax, prednisone, lokelma 10 grams x 3, thiamine, IV calcium gluconate 1 gram once  Vitamin/Mineral Profile: Vitamin B12: 424 (WNL) Folate B9: 7.6 (WNL) Vitamin A: pending Vitamin D: 27.15 (L) Vitamin C: 1.1 (WNL) Copper: 136 (WNL) Zinc: 42 (L)  Labs reviewed: potassium 6.1, BUN 46, creatinine 1.28 CBG's: 119-191 x 24 hours  UOP: 1350 ml x 24 hours I/O's: +5.4 L since admit  Diet Order:   Diet Order             Diet NPO time specified  Diet effective now                   EDUCATION NEEDS:   Not appropriate for education at this time  Skin:  Skin Assessment: Reviewed RN Assessment  Last BM:  09/07/21 large type 7  Height:   Ht Readings from Last 1 Encounters:  09/19/2021 _0  (1.651 m)    Weight:   Wt Readings from Last 1 Encounters:  09/08/21 40 kg    BMI:  Body mass index is 14.67 kg/m.  Estimated Nutritional Needs:   Kcal:  1300-1500  Protein:  55-70 grams  Fluid:  1.3-1.5 L    Gustavus Bryant, MS, RD, LDN Inpatient Clinical Dietitian  Please see [facility] for contact information.

## 2021-09-08 NOTE — Progress Notes (Signed)
Notified Dr. Tacy Learn of critical serum potassium of 6.3.

## 2021-09-08 NOTE — Progress Notes (Addendum)
[facility] Progress Note Patient Name: Donna Curry DOB: August 13, 1955 MRN: [id number]   Date of Service  09/08/2021  HPI/Events of Note  Multiple issues: 1. Hypotension - Norepinephrine IV infusion titrated up to support hemodynamis. 2. Hyperkalemia - K+ = 6.1 this AM. Received Lokelma and K+ not rechecked. 3. Ventilator asynchrony - AECOPD? Respiratory Therapy says that she is very tight with little air movement on auscultation.  eICU Interventions  Plan: ABG, BMP and CXR STAT. D/C Prednisone. Solumedrol 40 mg IV now and Q 6 hours. Increase Albuterol Nebs to Q 3 hours PRN SOB or wheezing.     Intervention Category Major Interventions: Electrolyte abnormality - evaluation and management;Hypotension - evaluation and management;Respiratory failure - evaluation and management  Lysle Dingwall 09/08/2021, 8:17 PM

## 2021-09-08 NOTE — Progress Notes (Addendum)
[facility] Progress Note Patient Name: Donna Curry DOB: [birth year] MRN: [id number]   Date of Service  09/08/2021  HPI/Events of Note  ABG on 100%/PRVC 34/TV 350/P 8 = 6.97/NA/333/NA. K+ > 7.5. Flow-time curve on ventilator c/w severe air trapping.   eICU Interventions  Plan: NaHCO3 200 meq IV now. NaHCO3 IV infusion at 100 mL/hour. Calcium gluconate 1 gm IV now.  D50 1 amp IV now.  Novolog 10 units IV now.  Lokelma 10 gm per tube now.  BMP already ordered Q 4 hours. Repeat ABG at 2 AM. Will request the PCCM ground team to evaluate the patient at bedside.      Intervention Category Major Interventions: Acid-Base disturbance - evaluation and management;Respiratory failure - evaluation and management  Lysle Dingwall 09/08/2021, 11:24 PM

## 2021-09-08 NOTE — Progress Notes (Addendum)
NAME:  Donna Curry, MRN:  [id number], DOB:  Aug 09, 1955, LOS: 87 ADMISSION DATE:  09/13/2021, CONSULTATION DATE:  08/30/2021  REFERRING MD:  Dr. Dina Rich, CHIEF COMPLAINT:  COPD exacerbation    History of Present Illness:  Donna Curry is a 66 year old female with a past medical history of chronic hypoxic respiratory failure 2/2 COPD on home supplemental oxygen (2L), bladder malignancy currently on Keytruda, iron deficiency anemia who presented to the ED with complaints of shortness of breath.  History limited 2/2 emergent intubation and sedation.  Per chart review, patient has been experiencing increasing shortness of breath for several days now.  She has been compliant with her home supplemental oxygen and nebulizer treatments.  On EMS arrival, she was hypoxic at 66% on home oxygen, with tachypnea and tripoding.  Patient was placed on CPAP with improvement in oxygenation up to 95%.  She was subsequently placed on BiPAP however due to worsening work of breathing, intubation was pursued.  Patient has received breathing treatments and high dose steroids.   Pertinent  Medical History   Past Medical History:  Diagnosis Date   Anemia    Asthma    Dyspnea    with exertion    Family history of adverse reaction to anesthesia    brother wakes up and dont know who he is and sister has n/v   Goals of care, counseling/discussion 02/09/2018   History of blood transfusion    Hypertension    Iron deficiency anemia due to chronic blood loss 03/01/2018   Measles as a child   Mumps as a child   TIA (transient ischemic attack)    Tobacco abuse    Significant Hospital Events: Including procedures, antibiotic start and stop dates in addition to other pertinent events   09/06/2021: Presented to the ED. Intubated. Transferred to ICU  Interim History / Subjective:   Overnight, levophed was restarted due to hypotension. Home Amlodipine was discontinued. AFB smear returned as negative. Other cultures all  still negative.   This AM, patient is on maximum sedation and not following commands. BP with MAP>65 and Levophed is off.   Objective   Blood pressure 106/68, pulse 66, temperature 98.6 F (37 C), temperature source Oral, resp. rate (!) 28, height 5\' 5"  (1.651 m), weight 40 kg, SpO2 100 %.    Vent Mode: PRVC FiO2 (%):  [30 %-40 %] 40 % Set Rate:  [24 bmp-28 bmp] 28 bmp Vt Set:  [450 mL] 450 mL PEEP:  [5 cmH20] 5 cmH20 Pressure Support:  [5 cmH20] 5 cmH20 Plateau Pressure:  [17 cmH20-19 cmH20] 19 cmH20   Intake/Output Summary (Last 24 hours) at 09/08/2021 0714 Last data filed at 09/08/2021 0525 Gross per 24 hour  Intake 2422.49 ml  Output 1350 ml  Net 1072.49 ml    Filed Weights   09/05/21 0500 09/07/21 0421 09/08/21 0500  Weight: 36 kg 35.4 kg 40 kg   Examination: General: Cachectic woman, ventilated. No acute distress HENT: ET tube in place. Lungs: Minimal, intermittent expiratory wheezing, R > L. No rhonchi or rales.  Cardiovascular: Regular rate and rhythm, no murmur.  Abdomen: Nondistended, positive bowel sounds.  Extremities: No edema Neuro: On maximum sedation. Not awakening to voice.  GU: Deferred  Resolved Hospital Problem list     Assessment & Plan:   # Hypotension  Patient required short term Levophed yesterday after sedation increased for vent dyssynchrony. Overnight, patient became hypotensive again, requiring levophed temporarily. No evidence of shock at this time  but will monitor closely.   - Discontinue home Amlodipine  - Levophed PRN to maintain MAP > 65  # Acute on Chronic Hypoxic and Hypercapnic Respiratory Failure # Multi-focal CAP # History of COPD  Completed 3 day course of Azithromycin (2/9 - 2/11) and 5 day course of Ceftriaxone (2/9 - 2-13). Respiratory cultures NGTD x 2 days. AFB negative as well.    - Difficulty with SBT due to severe underlying COPD and anxiety. Family is discussing long-term trach versus comfort care.  - Continue  PRVC - Continue Propofol gtt and Fentanyl gtt - Bronchodilators as ordered - Continue Prednisone, Day 7  # Acute Kidney Injury # Hyperkalemia  Etiology uncertain, pre-renal due to insensible losses versus post-renal given hydronephrosis on imaging (however it may be chronic given bladder cancer). Labs continue to improve, uncertain if it was from IVF or insertion of foley.   Worsening of hyperkalemia noted despite improvement of creatinine. UOP down from the day prior. Not on any medications that may be contributory. pH WNL.   - Continue foley catheter  - Strict in/out - Monitor creatinine - Lokelma TID today - Discuss with nutritionist to change TF to low potassium formulation  # Acute on Chronic Normocytic Anemia # History of IDA Hemoglobin decreased from 13 to 9 on admission, however persistently ~ 9 since. Suspect anemia of chronic disease vs IDA. No changes today.   - Follow CBC - Follow for any evidence of active blood loss, none noted  # Hypernatremia Improved.   - Continue free water to tube feeds, 200 cc q6h for 24 hours  # History of Hypertension  - Amlodipine discontinued due to hypotension  # Malignant Neoplasm of the Bladder  - On maintenance suppressive Keytruda every 2 months.  Managed by [name].   # Depression - Continue home Celexa - Diazepam available as needed  Best Practice (right click and "Reselect all SmartList Selections" daily)  Diet/type: NPO DVT prophylaxis: Lovenox GI prophylaxis: PPI Lines: N/A Foley:  N/A Code Status:  full code Last date of multidisciplinary goals of care discussion [09/07/2021 with daughter, mother and multiple sisters]  Labs   CBC: Recent Labs  Lab 09/10/2021 1220 09/20/2021 1601 09/03/21 1139 09/04/21 0324 09/05/21 0500 09/06/21 0158 09/07/21 0030 09/07/21 1041  WBC 9.5   < > 11.3* 8.2 10.5 11.0* 8.4  --   NEUTROABS 7.8*  --  10.4*  --   --   --   --   --   HGB 13.3   < > 8.6* 9.2* 9.0* 9.2* 8.6* 10.2*   HCT 41.9   < > 27.5* 28.7* 28.1* 29.4* 28.6* 30.0*  MCV 99.1   < > 97.5 98.0 98.6 100.0 101.8*  --   PLT 207   < > 211 211 208 220 222  --    < > = values in this interval not displayed.    Basic Metabolic Panel: Recent Labs  Lab 09/15/2021 1220 08/27/2021 1601 09/03/21 0348 09/03/21 0840 09/03/21 1650 09/04/21 0324 09/05/21 0500 09/06/21 0158 09/06/21 1237 09/06/21 1535 09/07/21 0030 09/07/21 1041  NA 140   < > 141   < >  --  144 148* 147*  --  150* 149* 145  K 4.5   < > 3.6   < >  --  5.0 5.3* 5.6* 6.0* 6.1* 5.7* 5.3*  CL 97*  --  103  --   --  107 109 106  --  107 105  --  CO2 24  --  29  --   --  29 30 34*  --  34* 36*  --   GLUCOSE 150*  --  126*  --   --  169* 120* 132*  --  159* 113*  --   BUN 26*  --  22  --   --  34* 47* 54*  --  59* 52*  --   CREATININE 1.10*  --  0.88  --   --  1.08* 1.48* 1.61*  --  2.10* 1.62*  --   CALCIUM 9.7  --  8.8*  --   --  9.0 8.7* 8.4*  --  8.7* 8.6*  --   MG 3.7*  --  2.3  --  2.6* 2.6* 2.6*  --   --   --   --   --   PHOS  --   --   --   --  3.8 4.0 2.8  --   --   --   --   --    < > = values in this interval not displayed.    GFR: Estimated Creatinine Clearance: 21.9 mL/min (A) (by C-G formula based on SCr of 1.62 mg/dL (H)). Recent Labs  Lab 08/26/2021 1832 09/03/21 0348 09/04/21 0324 09/05/21 0500 09/06/21 0158 09/07/21 0030  PROCALCITON 16.12  --   --   --   --   --   WBC  --    < > 8.2 10.5 11.0* 8.4   < > = values in this interval not displayed.     Liver Function Tests: Recent Labs  Lab 09/15/2021 1220  AST 28  ALT 17  ALKPHOS 89  BILITOT 1.4*  PROT 7.8  ALBUMIN 3.4*    No results for input(s): LIPASE, AMYLASE in the last 168 hours. No results for input(s): AMMONIA in the last 168 hours.  ABG    Component Value Date/Time   PHART 7.342 (L) 09/07/2021 1041   PCO2ART 71.0 (HH) 09/07/2021 1041   PO2ART 65 (L) 09/07/2021 1041   HCO3 38.4 (H) 09/07/2021 1041   TCO2 40 (H) 09/07/2021 1041   O2SAT 90 09/07/2021  1041     Coagulation Profile: No results for input(s): INR, PROTIME in the last 168 hours.  Cardiac Enzymes: No results for input(s): CKTOTAL, CKMB, CKMBINDEX, TROPONINI in the last 168 hours.  HbA1C: Hgb A1c MFr Bld  Date/Time Value Ref Range Status  08/28/2021 06:32 PM 5.3 4.8 - 5.6 % Final    Comment:    (NOTE) Pre diabetes:          5.7%-6.4%  Diabetes:              >6.4%  Glycemic control for   <7.0% adults with diabetes   12/04/2017 10:40 AM 4.5 (L) 4.6 - 6.5 % Final    Comment:    Glycemic Control Guidelines for People with Diabetes:Non Diabetic:  <6%Goal of Therapy: <7%Additional Action Suggested:  >8%     CBG: Recent Labs  Lab 09/07/21 1219 09/07/21 1539 09/07/21 1923 09/07/21 2311 09/08/21 0329  GLUCAP 191* 132* 132* 124* 119*    Dr. Jose Persia Internal Medicine PGY-3  09/08/2021, 7:14 AM

## 2021-09-09 ENCOUNTER — Telehealth: Payer: Self-pay | Admitting: Family

## 2021-09-09 DIAGNOSIS — N170 Acute kidney failure with tubular necrosis: Secondary | ICD-10-CM | POA: Diagnosis not present

## 2021-09-09 DIAGNOSIS — J9601 Acute respiratory failure with hypoxia: Secondary | ICD-10-CM | POA: Diagnosis not present

## 2021-09-09 DIAGNOSIS — J441 Chronic obstructive pulmonary disease with (acute) exacerbation: Secondary | ICD-10-CM | POA: Diagnosis not present

## 2021-09-09 LAB — CBC WITH DIFFERENTIAL/PLATELET
Abs Immature Granulocytes: 0.46 10*3/uL — ABNORMAL HIGH (ref 0.00–0.07)
Basophils Absolute: 0.1 10*3/uL (ref 0.0–0.1)
Basophils Relative: 1 %
Eosinophils Absolute: 0 10*3/uL (ref 0.0–0.5)
Eosinophils Relative: 0 %
HCT: 27.2 % — ABNORMAL LOW (ref 36.0–46.0)
Hemoglobin: 8.6 g/dL — ABNORMAL LOW (ref 12.0–15.0)
Immature Granulocytes: 4 %
Lymphocytes Relative: 3 %
Lymphs Abs: 0.4 10*3/uL — ABNORMAL LOW (ref 0.7–4.0)
MCH: 30.6 pg (ref 26.0–34.0)
MCHC: 31.6 g/dL (ref 30.0–36.0)
MCV: 96.8 fL (ref 80.0–100.0)
Monocytes Absolute: 0.6 10*3/uL (ref 0.1–1.0)
Monocytes Relative: 6 %
Neutro Abs: 9.6 10*3/uL — ABNORMAL HIGH (ref 1.7–7.7)
Neutrophils Relative %: 86 %
Platelets: 175 10*3/uL (ref 150–400)
RBC: 2.81 MIL/uL — ABNORMAL LOW (ref 3.87–5.11)
RDW: 17.3 % — ABNORMAL HIGH (ref 11.5–15.5)
WBC: 11.1 10*3/uL — ABNORMAL HIGH (ref 4.0–10.5)
nRBC: 0.4 % — ABNORMAL HIGH (ref 0.0–0.2)

## 2021-09-09 LAB — BASIC METABOLIC PANEL
Anion gap: 15 (ref 5–15)
BUN: 67 mg/dL — ABNORMAL HIGH (ref 8–23)
CO2: 34 mmol/L — ABNORMAL HIGH (ref 22–32)
Calcium: 8.6 mg/dL — ABNORMAL LOW (ref 8.9–10.3)
Chloride: 98 mmol/L (ref 98–111)
Creatinine, Ser: 1.91 mg/dL — ABNORMAL HIGH (ref 0.44–1.00)
GFR, Estimated: 29 mL/min — ABNORMAL LOW (ref >60)
Glucose, Bld: 142 mg/dL — ABNORMAL HIGH (ref 70–99)
Potassium: 5.2 mmol/L — ABNORMAL HIGH (ref 3.5–5.1)
Sodium: 147 mmol/L — ABNORMAL HIGH (ref 135–145)

## 2021-09-09 LAB — POCT I-STAT 7, (LYTES, BLD GAS, ICA,H+H)
Acid-Base Excess: 17 mmol/L — ABNORMAL HIGH (ref 0.0–2.0)
Bicarbonate: 44.8 mmol/L — ABNORMAL HIGH (ref 20.0–28.0)
Calcium, Ion: 1.09 mmol/L — ABNORMAL LOW (ref 1.15–1.40)
HCT: 27 % — ABNORMAL LOW (ref 36.0–46.0)
Hemoglobin: 9.2 g/dL — ABNORMAL LOW (ref 12.0–15.0)
O2 Saturation: 100 %
Patient temperature: 99.7
Potassium: 4.7 mmol/L (ref 3.5–5.1)
Sodium: 143 mmol/L (ref 135–145)
TCO2: 47 mmol/L — ABNORMAL HIGH (ref 22–32)
pCO2 arterial: 76.1 mmHg (ref 32–48)
pH, Arterial: 7.381 (ref 7.35–7.45)
pO2, Arterial: 493 mmHg — ABNORMAL HIGH (ref 83–108)

## 2021-09-09 LAB — GLUCOSE, CAPILLARY
Glucose-Capillary: 108 mg/dL — ABNORMAL HIGH (ref 70–99)
Glucose-Capillary: 116 mg/dL — ABNORMAL HIGH (ref 70–99)
Glucose-Capillary: 123 mg/dL — ABNORMAL HIGH (ref 70–99)
Glucose-Capillary: 131 mg/dL — ABNORMAL HIGH (ref 70–99)
Glucose-Capillary: 132 mg/dL — ABNORMAL HIGH (ref 70–99)
Glucose-Capillary: 140 mg/dL — ABNORMAL HIGH (ref 70–99)

## 2021-09-09 LAB — VITAMIN A: Vitamin A (Retinoic Acid): 15.8 ug/dL — ABNORMAL LOW (ref 22.0–69.5)

## 2021-09-09 MED ORDER — CALCIUM GLUCONATE-NACL 1-0.675 GM/50ML-% IV SOLN
1.0000 g | Freq: Once | INTRAVENOUS | Status: AC
  Administered 2021-09-09: 1000 mg via INTRAVENOUS
  Filled 2021-09-09: qty 50

## 2021-09-09 MED ORDER — POLYETHYLENE GLYCOL 3350 17 G PO PACK: 17.0000 g | PACK | Freq: Every day | ORAL | Status: DC | PRN

## 2021-09-09 NOTE — Progress Notes (Addendum)
NAME:  Donna Curry, MRN:  [id number], DOB:  19-Jul-1956, LOS: 7 ADMISSION DATE:  09/08/2021, CONSULTATION DATE:  09/14/2021  REFERRING MD:  Dr. Dina Rich, CHIEF COMPLAINT:  COPD exacerbation    History of Present Illness:  [name] is a 66 year old female with a past medical history of chronic hypoxic respiratory failure 2/2 COPD on home supplemental oxygen (2L), bladder malignancy currently on Keytruda, iron deficiency anemia who presented to the ED with complaints of shortness of breath.  History limited 2/2 emergent intubation and sedation.  Per chart review, patient has been experiencing increasing shortness of breath for several days now.  She has been compliant with her home supplemental oxygen and nebulizer treatments.  On EMS arrival, she was hypoxic at 66% on home oxygen, with tachypnea and tripoding.  Patient was placed on CPAP with improvement in oxygenation up to 95%.  She was subsequently placed on BiPAP however due to worsening work of breathing, intubation was pursued.  Patient has received breathing treatments and high dose steroids.   Pertinent  Medical History   Past Medical History:  Diagnosis Date   Anemia    Asthma    Dyspnea    with exertion    Family history of adverse reaction to anesthesia    brother wakes up and dont know who he is and sister has n/v   Goals of care, counseling/discussion 02/09/2018   History of blood transfusion    Hypertension    Iron deficiency anemia due to chronic blood loss 03/01/2018   Measles as a child   Mumps as a child   TIA (transient ischemic attack)    Tobacco abuse    Significant Hospital Events: Including procedures, antibiotic start and stop dates in addition to other pertinent events   08/26/2021: Presented to the ED. Intubated. Transferred to ICU 09/07/2021: Patient repeatedly fails SBTs. Discussion with family that extubation is unlikely. Family considering comfort care versus trach.   Interim History / Subjective:    Overnight, patient became hypotension as low as 70/52, requiring Levophed initiation. ABG obtained showed markedly acidemia with pH of 6.97. pCO2 and PO2 unable to be calculated. K elevated at 7.5. Patient started on bicarb infusion, given insulin and calcium gluconate. Levophed has been weaned from 22 mcg/min to 2 mcg/min. Repeat ABG obtained with pCO2 at 76 and improved pH of 7.381. Patient switched from Prednisone to Solumedrol overnight.   This AM, patient on maximal sedation. Nephew is in the room; denies any questions at this time.   Objective   Blood pressure 132/80, pulse 75, temperature 100.1 F (37.8 C), temperature source Oral, resp. rate (!) 28, height 5\' 5"  (1.651 m), weight 40 kg, SpO2 99 %.    Vent Mode: PRVC FiO2 (%):  [40 %-100 %] 50 % Set Rate:  [28 bmp-34 bmp] 28 bmp Vt Set:  [350 mL-500 mL] 500 mL PEEP:  [5 cmH20-8 cmH20] 8 cmH20 Plateau Pressure:  [18 cmH20-39 cmH20] 20 cmH20   Intake/Output Summary (Last 24 hours) at 09/09/2021 4098 Last data filed at 09/09/2021 0618 Gross per 24 hour  Intake 4742.9 ml  Output 1675 ml  Net 3067.9 ml    Filed Weights   09/07/21 0421 09/08/21 0500 09/09/21 0500  Weight: 35.4 kg 40 kg 40 kg   Examination: General: Cachectic woman, ventilated. No acute distress HENT: ET tube in place. Lungs: Decreased breathe sounds throughout. Minimal audible wheezing R>L. No rhonchi or rales.  Cardiovascular: Regular rate and rhythm, no murmur.  Abdomen:  Nondistended, positive bowel sounds.  Extremities: No edema. Sarcopenia in all extremities.  Neuro: On maximum sedation. Not awakening to voice.  GU: Deferred  Resolved Hospital Problem list     Assessment & Plan:   # Hypotension  Overnight, patient became hypotensive requiring levophed once again. Potentially in the setting of maximal sedation versus high auto-PEEP leading cardiac compromise. Overall, extremely poor prognosis.   - Levophed PRN to maintain MAP > 65  # Acute on  Chronic Hypoxic and Hypercapnic Respiratory Failure # Multi-focal CAP # History of COPD  Completed 3 day course of Azithromycin (2/9 - 2/11) and 5 day course of Ceftriaxone (2/9 - 2-13). Respiratory cultures NGTD x 2 days. AFB negative as well.    Despite treatment, patient has failed to improve and persistently failed SBT trials. In addition, significant vent dyssynchrony requiring intermittent paralysis. At this point, no further interventions are available. Family is aware and understands that patient is actively dying.   - Continue PRVC - Continue Propofol gtt and Fentanyl gtt - Bronchodilators as ordered - Continue Solumedrol for now as unlikely to change clinical status   # Acute Kidney Injury # Hyperkalemia  Worsening renal function, potentially a component of ATN due to severe hypotension overnight. Persistently hyperkalemia despite maximum dose Lokelma and switching to lower potassium tube feeds.   - Continue foley catheter  - Strict in/out - Monitor creatinine - Continue Lokelma TID today  # Acute on Chronic Normocytic Anemia # History of IDA Stable at this time. No signs of bleeding.   # Hypernatremia Mildly hypernatremic today.   - Continue free water to tube feeds, 200 cc q6h for 24 hours  # History of Hypertension  - Amlodipine discontinued due to hypotension  # Malignant Neoplasm of the Bladder  - On maintenance suppressive Keytruda every 2 months.  Managed by [name].   # Depression - Continue home Celexa - Diazepam available as needed  Best Practice (right click and "Reselect all SmartList Selections" daily)  Diet/type: NPO DVT prophylaxis: Lovenox GI prophylaxis: PPI Lines: N/A Foley:  N/A Code Status:  full code Last date of multidisciplinary goals of care discussion [09/08/2021 with daughter, nephew and cousin]  Labs   CBC: Recent Labs  Lab 09/18/2021 1220 08/30/2021 1601 09/03/21 1139 09/04/21 0324 09/05/21 0500 09/06/21 0158  09/07/21 0030 09/07/21 1041 09/08/21 0753 09/08/21 0913 09/09/21 0119 09/09/21 0321  WBC 9.5   < > 11.3*   < > 10.5 11.0* 8.4  --  6.6  --   --  11.1*  NEUTROABS 7.8*  --  10.4*  --   --   --   --   --  4.8  --   --  9.6*  HGB 13.3   < > 8.6*   < > 9.0* 9.2* 8.6* 10.2* 8.7* 10.9* 9.2* 8.6*  HCT 41.9   < > 27.5*   < > 28.1* 29.4* 28.6* 30.0* 27.9* 32.0* 27.0* 27.2*  MCV 99.1   < > 97.5   < > 98.6 100.0 101.8*  --  99.3  --   --  96.8  PLT 207   < > 211   < > 208 220 222  --  211  --   --  175   < > = values in this interval not displayed.    Basic Metabolic Panel: Recent Labs  Lab 09/05/2021 1220 08/31/2021 1601 09/03/21 0348 09/03/21 0840 09/03/21 1650 09/04/21 0324 09/05/21 0500 09/06/21 0158 09/06/21 1535 09/07/21 0030 09/07/21 1041  09/08/21 0753 09/08/21 0913 09/08/21 2147 09/09/21 0119 09/09/21 0321  NA 140   < > 141   < >  --  144 148*   < > 150* 149*   < > 144 143 141 143 147*  K 4.5   < > 3.6   < >  --  5.0 5.3*   < > 6.1* 5.7*   < > 6.3* 6.1* >7.5* 4.7 5.2*  CL 97*  --  103  --   --  107 109   < > 107 105  --  103  --  99  --  98  CO2 24  --  29  --   --  29 30   < > 34* 36*  --  35*  --  27  --  34*  GLUCOSE 150*  --  126*  --   --  169* 120*   < > 159* 113*  --  124*  --  116*  --  142*  BUN 26*  --  22  --   --  34* 47*   < > 59* 52*  --  46*  --  56*  --  67*  CREATININE 1.10*  --  0.88  --   --  1.08* 1.48*   < > 2.10* 1.62*  --  1.28*  --  1.84*  --  1.91*  CALCIUM 9.7  --  8.8*  --   --  9.0 8.7*   < > 8.7* 8.6*  --  8.5*  --  8.9  --  8.6*  MG 3.7*  --  2.3  --  2.6* 2.6* 2.6*  --   --   --   --   --   --   --   --   --   PHOS  --   --   --   --  3.8 4.0 2.8  --   --   --   --   --   --   --   --   --    < > = values in this interval not displayed.    GFR: Estimated Creatinine Clearance: 18.5 mL/min (A) (by C-G formula based on SCr of 1.91 mg/dL (H)). Recent Labs  Lab 09/06/2021 1832 09/03/21 0348 09/06/21 0158 09/07/21 0030 09/08/21 0753  09/09/21 0321  PROCALCITON 16.12  --   --   --   --   --   WBC  --    < > 11.0* 8.4 6.6 11.1*  LATICACIDVEN  --   --   --   --  1.0  --    < > = values in this interval not displayed.     Liver Function Tests: Recent Labs  Lab 08/26/2021 1220  AST 28  ALT 17  ALKPHOS 89  BILITOT 1.4*  PROT 7.8  ALBUMIN 3.4*    No results for input(s): LIPASE, AMYLASE in the last 168 hours. No results for input(s): AMMONIA in the last 168 hours.  ABG    Component Value Date/Time   PHART 7.381 09/09/2021 0119   PCO2ART 76.1 (HH) 09/09/2021 0119   PO2ART 493 (H) 09/09/2021 0119   HCO3 44.8 (H) 09/09/2021 0119   TCO2 47 (H) 09/09/2021 0119   O2SAT 100 09/09/2021 0119     Coagulation Profile: No results for input(s): INR, PROTIME in the last 168 hours.  Cardiac Enzymes: No results for input(s): CKTOTAL, CKMB, CKMBINDEX, TROPONINI in the last 168 hours.  HbA1C: Hgb A1c MFr Bld  Date/Time Value Ref Range Status  09/10/2021 06:32 PM 5.3 4.8 - 5.6 % Final    Comment:    (NOTE) Pre diabetes:          5.7%-6.4%  Diabetes:              >6.4%  Glycemic control for   <7.0% adults with diabetes   12/04/2017 10:40 AM 4.5 (L) 4.6 - 6.5 % Final    Comment:    Glycemic Control Guidelines for People with Diabetes:Non Diabetic:  <6%Goal of Therapy: <7%Additional Action Suggested:  >8%     CBG: Recent Labs  Lab 09/08/21 1146 09/08/21 1505 09/08/21 1925 09/08/21 2332 09/09/21 0319  GLUCAP 145* 175* 161* 255* 131*    Dr. Jose Persia Internal Medicine PGY-3  09/09/2021, 7:12 AM

## 2021-09-09 NOTE — Progress Notes (Signed)
°   09/09/21 1800  Clinical Encounter Type  Visited With Family  Visit Type Initial;Spiritual support  Referral From Nurse  Consult/Referral To Chaplain   Chaplain spoke with family member (sister) regarding patient's condition and provided emotional and spiritual support. Encouraged sister to continue to offer support to patient's daughter who is praying for miracle.    Melody Haver, Resident Chaplain [phone]

## 2021-09-09 NOTE — Progress Notes (Signed)
Updated patient's family, including nephew, sisters, and daughter, about overnight events and worsening status. Daughter says that she is on the way to the hospital now.

## 2021-09-09 NOTE — Telephone Encounter (Signed)
Left message for patient to call back and schedule Medicare Annual Wellness Visit (AWV) in office.  ° °If not able to come in office, please offer to do virtually or by telephone.  Left office number and my jabber #[phone]. ° °Due for AWVI ° °Please schedule at anytime with Nurse Health Advisor. °  °

## 2021-09-09 NOTE — Progress Notes (Signed)
[facility] Progress Note Patient Name: Donna Curry DOB: 1955-11-12 MRN: [id number]   Date of Service  09/09/2021  HPI/Events of Note  ABG on 100%/PRVC 28/TV 500/P 8 = 7.38/76.1/493/44. 2. Hypokalemia - ionized Ca++ = 1.09.  eICU Interventions  Plan: Decrease FiO2 to 50%. Replace Ca++.     Intervention Category Major Interventions: Respiratory failure - evaluation and management  Hellon Vaccarella [place] 09/09/2021, 1:45 AM

## 2021-09-09 NOTE — Care Plan (Signed)
Interval Progress Note:   Discussed with patient's daughter (any primary decision-maker), nephew and sister. I updated them regarding clinical status. Donna Curry clinical status has sharply declined overnight with worsening hypotension and acidemia. Given multi-organ failure, she has extremely minimal chance of recovery. Due to this, we recommend consideration of comfort measures. Family expressed understanding. All questions and concerns addressed. Family requests a moment of privacy to discuss this further with additional family members.   Signed,  Dr. Jose Persia Internal Medicine PGY-3  09/09/2021, 2:46 PM

## 2021-09-10 DIAGNOSIS — J9601 Acute respiratory failure with hypoxia: Secondary | ICD-10-CM | POA: Diagnosis not present

## 2021-09-10 LAB — BASIC METABOLIC PANEL
Anion gap: 12 (ref 5–15)
BUN: 68 mg/dL — ABNORMAL HIGH (ref 8–23)
CO2: 44 mmol/L — ABNORMAL HIGH (ref 22–32)
Calcium: 8.2 mg/dL — ABNORMAL LOW (ref 8.9–10.3)
Chloride: 89 mmol/L — ABNORMAL LOW (ref 98–111)
Creatinine, Ser: 1.79 mg/dL — ABNORMAL HIGH (ref 0.44–1.00)
GFR, Estimated: 31 mL/min — ABNORMAL LOW (ref >60)
Glucose, Bld: 114 mg/dL — ABNORMAL HIGH (ref 70–99)
Potassium: 4 mmol/L (ref 3.5–5.1)
Sodium: 145 mmol/L (ref 135–145)

## 2021-09-10 LAB — CBC WITH DIFFERENTIAL/PLATELET
Abs Immature Granulocytes: 0.28 10*3/uL — ABNORMAL HIGH (ref 0.00–0.07)
Basophils Absolute: 0 10*3/uL (ref 0.0–0.1)
Basophils Relative: 0 %
Eosinophils Absolute: 0 10*3/uL (ref 0.0–0.5)
Eosinophils Relative: 0 %
HCT: 27.9 % — ABNORMAL LOW (ref 36.0–46.0)
Hemoglobin: 9 g/dL — ABNORMAL LOW (ref 12.0–15.0)
Immature Granulocytes: 3 %
Lymphocytes Relative: 4 %
Lymphs Abs: 0.3 10*3/uL — ABNORMAL LOW (ref 0.7–4.0)
MCH: 30.3 pg (ref 26.0–34.0)
MCHC: 32.3 g/dL (ref 30.0–36.0)
MCV: 93.9 fL (ref 80.0–100.0)
Monocytes Absolute: 0.5 10*3/uL (ref 0.1–1.0)
Monocytes Relative: 5 %
Neutro Abs: 7.9 10*3/uL — ABNORMAL HIGH (ref 1.7–7.7)
Neutrophils Relative %: 88 %
Platelets: 210 10*3/uL (ref 150–400)
RBC: 2.97 MIL/uL — ABNORMAL LOW (ref 3.87–5.11)
RDW: 17.3 % — ABNORMAL HIGH (ref 11.5–15.5)
WBC: 9 10*3/uL (ref 4.0–10.5)
nRBC: 0.2 % (ref 0.0–0.2)

## 2021-09-10 LAB — GLUCOSE, CAPILLARY
Glucose-Capillary: 99 mg/dL (ref 70–99)
Glucose-Capillary: 119 mg/dL — ABNORMAL HIGH (ref 70–99)
Glucose-Capillary: 135 mg/dL — ABNORMAL HIGH (ref 70–99)
Glucose-Capillary: 159 mg/dL — ABNORMAL HIGH (ref 70–99)
Glucose-Capillary: 113 mg/dL — ABNORMAL HIGH (ref 70–99)
Glucose-Capillary: 155 mg/dL — ABNORMAL HIGH (ref 70–99)

## 2021-09-10 MED ORDER — FENTANYL BOLUS VIA INFUSION
25.0000 ug | INTRAVENOUS | Status: DC | PRN
  Administered 2021-09-10 (×2): 100 ug via INTRAVENOUS
  Administered 2021-09-10: 50 ug via INTRAVENOUS
  Administered 2021-09-10: 100 ug via INTRAVENOUS
  Administered 2021-09-10: 50 ug via INTRAVENOUS
  Administered 2021-09-10 (×2): 100 ug via INTRAVENOUS
  Administered 2021-09-11: 50 ug via INTRAVENOUS
  Administered 2021-09-11 (×2): 100 ug via INTRAVENOUS
  Administered 2021-09-11: 50 ug via INTRAVENOUS
  Administered 2021-09-11: 100 ug via INTRAVENOUS
  Administered 2021-09-11: 50 ug via INTRAVENOUS
  Administered 2021-09-12 (×2): 100 ug via INTRAVENOUS
  Administered 2021-09-12: 75 ug via INTRAVENOUS
  Administered 2021-09-12 – 2021-09-14 (×17): 100 ug via INTRAVENOUS
  Administered 2021-09-14: 25 ug via INTRAVENOUS
  Administered 2021-09-14: 50 ug via INTRAVENOUS
  Administered 2021-09-14 (×2): 100 ug via INTRAVENOUS
  Administered 2021-09-14: 50 ug via INTRAVENOUS
  Administered 2021-09-15: 100 ug via INTRAVENOUS
  Administered 2021-09-15: 50 ug via INTRAVENOUS
  Administered 2021-09-15 – 2021-09-16 (×3): 100 ug via INTRAVENOUS
  Administered 2021-09-16: 75 ug via INTRAVENOUS
  Administered 2021-09-16 – 2021-09-17 (×5): 100 ug via INTRAVENOUS
  Filled 2021-09-10: qty 100

## 2021-09-10 MED ORDER — DEXMEDETOMIDINE HCL IN NACL 400 MCG/100ML IV SOLN
0.4000 ug/kg/h | INTRAVENOUS | Status: DC
  Administered 2021-09-10: 1.2 ug/kg/h via INTRAVENOUS
  Administered 2021-09-10: 0.8 ug/kg/h via INTRAVENOUS
  Administered 2021-09-11: 1.1 ug/kg/h via INTRAVENOUS
  Administered 2021-09-11 – 2021-09-12 (×4): 1.2 ug/kg/h via INTRAVENOUS
  Administered 2021-09-13: 1 ug/kg/h via INTRAVENOUS
  Administered 2021-09-13: 1.2 ug/kg/h via INTRAVENOUS
  Filled 2021-09-10 (×9): qty 100

## 2021-09-10 MED ORDER — LABETALOL HCL 5 MG/ML IV SOLN
5.0000 mg | INTRAVENOUS | Status: DC | PRN
  Administered 2021-09-10 – 2021-09-11 (×4): 5 mg via INTRAVENOUS
  Filled 2021-09-10 (×4): qty 4

## 2021-09-10 MED ORDER — METHYLPREDNISOLONE SODIUM SUCC 125 MG IJ SOLR
80.0000 mg | Freq: Every day | INTRAMUSCULAR | Status: DC
  Administered 2021-09-11: 80 mg via INTRAVENOUS
  Filled 2021-09-10: qty 2

## 2021-09-10 NOTE — Progress Notes (Signed)
Brief Nutrition Note  Discussed pt with RN and during ICU rounds. Tube feeds infusing at goal rate via Cortrak and pt tolerating well. Potassium and phosphorus labs are WNL today. Noted vitamin A lab came back low at 15.8.   There is no form of vitamin A that can be administered per tube currently on hospital formulary. Once/if pt is able to take PO, recommend repleting vitamin A deficiency with 200,000 units vitamin A every 6 months.   Gustavus Bryant, MS, RD, LDN Inpatient Clinical Dietitian Please see [facility] for contact information.

## 2021-09-10 NOTE — Progress Notes (Addendum)
NAME:  Donna Curry, MRN:  [id number], DOB:  1955-08-11, LOS: 51 ADMISSION DATE:  09/09/2021, CONSULTATION DATE:  08/30/2021  REFERRING MD:  Dr. Dina Rich, CHIEF COMPLAINT:  COPD exacerbation    History of Present Illness:  [name] is a 66 year old female with a past medical history of chronic hypoxic respiratory failure 2/2 COPD on home supplemental oxygen (2L), bladder malignancy currently on Keytruda, iron deficiency anemia who presented to the ED with complaints of shortness of breath.  History limited 2/2 emergent intubation and sedation.  Per chart review, patient has been experiencing increasing shortness of breath for several days now.  She has been compliant with her home supplemental oxygen and nebulizer treatments.  On EMS arrival, she was hypoxic at 66% on home oxygen, with tachypnea and tripoding.  Patient was placed on CPAP with improvement in oxygenation up to 95%.  She was subsequently placed on BiPAP however due to worsening work of breathing, intubation was pursued.  Patient has received breathing treatments and high dose steroids.   Pertinent  Medical History   Past Medical History:  Diagnosis Date   Anemia    Asthma    Dyspnea    with exertion    Family history of adverse reaction to anesthesia    brother wakes up and dont know who he is and sister has n/v   Goals of care, counseling/discussion 02/09/2018   History of blood transfusion    Hypertension    Iron deficiency anemia due to chronic blood loss 03/01/2018   Measles as a child   Mumps as a child   TIA (transient ischemic attack)    Tobacco abuse    Significant Hospital Events: Including procedures, antibiotic start and stop dates in addition to other pertinent events   09/12/2021: Presented to the ED. Intubated. Transferred to ICU 09/07/2021: Patient repeatedly fails SBTs. Discussion with family that extubation is unlikely. Family considering comfort care versus trach.  2/16: Acute decompensation in the  early hours with severe acidemia, hypotension  Interim History / Subjective:   No overnight events. No additional hypotensive events. Remains on maximum sedation. Opens eyes and follows commands intermittently.   Objective   Blood pressure (!) 169/96, pulse 86, temperature 99.6 F (37.6 C), temperature source Oral, resp. rate (!) 28, height 5\' 5"  (1.651 m), weight 40 kg, SpO2 99 %.    Vent Mode: PRVC FiO2 (%):  [40 %] 40 % Set Rate:  [28 bmp-228 bmp] 28 bmp Vt Set:  [500 mL] 500 mL PEEP:  [8 cmH20] 8 cmH20 Plateau Pressure:  [18 cmH20-27 cmH20] 18 cmH20   Intake/Output Summary (Last 24 hours) at 09/10/2021 0751 Last data filed at 09/10/2021 0600 Gross per 24 hour  Intake 4257.29 ml  Output 1750 ml  Net 2507.29 ml    Filed Weights   09/08/21 0500 09/09/21 0500 09/10/21 0500  Weight: 40 kg 40 kg 40 kg   Examination: General: Cachectic woman, ventilated. No acute distress HENT: ET tube in place. Lungs: Decreased breathe sounds throughout but clear to auscultation  Cardiovascular: Regular rate and rhythm, no murmur.  Abdomen: Nondistended, positive bowel sounds.  Extremities: No edema. Sarcopenia in all extremities.  Neuro: Patient opens her eyes and follows commands intermittently. Shakes her head to questions. Moves all extremities.  GU: Deferred  Resolved Hospital Problem list   N/A  Assessment & Plan:   # Goals of Care Despite treatment, patient has persistently failed SBT trials due to tachycardia, severe hypertension and agitation; occasionally  experiences hypoxia as well. Intermittent vent dyssynchrony with double and triple stacking requiring intermittent paralysis on 2/15. We have discussed with family that patient is not a candidate for tracheostomy. Will need to continue discussion with family.   - May ultimately benefit from Palliative consultation   # Acute on Chronic Hypoxic and Hypercapnic Respiratory Failure # Multi-focal CAP # History of COPD  -  Completed 3 day course of Azithromycin (2/9 - 2/11) and 5 day course of Ceftriaxone (2/9 - 2-13).  - Respiratory cultures NGTD x 2 days. AFB negative as well.   - Continue PRVC.  - Daily SBTs as tolerated - Discontinue Propofol  - Start Precedex gtt  - Continue Fentanyl gtt - Bronchodilators as ordered - Wean Solumedrol slowly. Will transition to once daily  # Hypotension # History of Hypertension  Resolved with improvement in acidosis.   - Discontinue bicarb gtt  # Acute Kidney Injury # Hyperkalemia  Renal function has remained stable. No additional hyperkalemia.   - Continue foley catheter  - Strict in/out - Monitor creatinine  # Acute on Chronic Normocytic Anemia # History of IDA Stable at this time. No signs of bleeding.   # Hypernatremia - Continue free water to tube feeds, 100 cc q6h  # Malignant Neoplasm of the Bladder  - On maintenance suppressive Keytruda every 2 months.  Managed by [name].   # Depression - Continue home Celexa - Diazepam available as needed  Best Practice (right click and "Reselect all SmartList Selections" daily)  Diet/type: NPO DVT prophylaxis: Heparin GI prophylaxis: PPI Lines: N/A Foley:  N/A Code Status:  DNR Last date of multidisciplinary goals of care discussion [09/09/2021 with daughter]  Labs   CBC: Recent Labs  Lab 09/03/21 1139 09/04/21 0324 09/06/21 0158 09/07/21 0030 09/07/21 1041 09/08/21 0753 09/08/21 0913 09/09/21 0119 09/09/21 0321 09/10/21 0223  WBC 11.3*   < > 11.0* 8.4  --  6.6  --   --  11.1* 9.0  NEUTROABS 10.4*  --   --   --   --  4.8  --   --  9.6* 7.9*  HGB 8.6*   < > 9.2* 8.6*   < > 8.7* 10.9* 9.2* 8.6* 9.0*  HCT 27.5*   < > 29.4* 28.6*   < > 27.9* 32.0* 27.0* 27.2* 27.9*  MCV 97.5   < > 100.0 101.8*  --  99.3  --   --  96.8 93.9  PLT 211   < > 220 222  --  211  --   --  175 210   < > = values in this interval not displayed.    Basic Metabolic Panel: Recent Labs  Lab 09/03/21 0840  09/03/21 1650 09/04/21 0324 09/05/21 0500 09/06/21 0158 09/07/21 0030 09/07/21 1041 09/08/21 0753 09/08/21 0913 09/08/21 2147 09/09/21 0119 09/09/21 0321 09/10/21 0223  NA  --   --  144 148*   < > 149*   < > 144 143 141 143 147* 145  K  --   --  5.0 5.3*   < > 5.7*   < > 6.3* 6.1* >7.5* 4.7 5.2* 4.0  CL   < >  --  107 109   < > 105  --  103  --  99  --  98 89*  CO2   < >  --  29 30   < > 36*  --  35*  --  27  --  34* 44*  GLUCOSE   < >  --  169* 120*   < > 113*  --  124*  --  116*  --  142* 114*  BUN   < >  --  34* 47*   < > 52*  --  46*  --  56*  --  67* 68*  CREATININE   < >  --  1.08* 1.48*   < > 1.62*  --  1.28*  --  1.84*  --  1.91* 1.79*  CALCIUM   < >  --  9.0 8.7*   < > 8.6*  --  8.5*  --  8.9  --  8.6* 8.2*  MG  --  2.6* 2.6* 2.6*  --   --   --   --   --   --   --   --   --   PHOS  --  3.8 4.0 2.8  --   --   --   --   --   --   --   --   --    < > = values in this interval not displayed.    GFR: Estimated Creatinine Clearance: 19.8 mL/min (A) (by C-G formula based on SCr of 1.79 mg/dL (H)). Recent Labs  Lab 09/07/21 0030 09/08/21 0753 09/09/21 0321 09/10/21 0223  WBC 8.4 6.6 11.1* 9.0  LATICACIDVEN  --  1.0  --   --      Liver Function Tests: No results for input(s): AST, ALT, ALKPHOS, BILITOT, PROT, ALBUMIN in the last 168 hours.  No results for input(s): LIPASE, AMYLASE in the last 168 hours. No results for input(s): AMMONIA in the last 168 hours.  ABG    Component Value Date/Time   PHART 7.381 09/09/2021 0119   PCO2ART 76.1 (HH) 09/09/2021 0119   PO2ART 493 (H) 09/09/2021 0119   HCO3 44.8 (H) 09/09/2021 0119   TCO2 47 (H) 09/09/2021 0119   O2SAT 100 09/09/2021 0119     Coagulation Profile: No results for input(s): INR, PROTIME in the last 168 hours.  Cardiac Enzymes: No results for input(s): CKTOTAL, CKMB, CKMBINDEX, TROPONINI in the last 168 hours.  HbA1C: Hgb A1c MFr Bld  Date/Time Value Ref Range Status  09/09/2021 06:32 PM 5.3 4.8 - 5.6  % Final    Comment:    (NOTE) Pre diabetes:          5.7%-6.4%  Diabetes:              >6.4%  Glycemic control for   <7.0% adults with diabetes   12/04/2017 10:40 AM 4.5 (L) 4.6 - 6.5 % Final    Comment:    Glycemic Control Guidelines for People with Diabetes:Non Diabetic:  <6%Goal of Therapy: <7%Additional Action Suggested:  >8%     CBG: Recent Labs  Lab 09/09/21 1113 09/09/21 1551 09/09/21 1945 09/09/21 2331 09/10/21 0306  GLUCAP 123* 132* 116* 140* 99    Dr. Jose Persia Internal Medicine PGY-3  09/10/2021, 7:51 AM

## 2021-09-10 NOTE — Progress Notes (Signed)
Pt placed on CPAP,PSV 8/5 40% and pt goes apneic. Pt flipped back to full support. RT will continue to monitor.

## 2021-09-11 DIAGNOSIS — J9601 Acute respiratory failure with hypoxia: Secondary | ICD-10-CM | POA: Diagnosis not present

## 2021-09-11 DIAGNOSIS — J441 Chronic obstructive pulmonary disease with (acute) exacerbation: Secondary | ICD-10-CM | POA: Diagnosis not present

## 2021-09-11 LAB — CBC WITH DIFFERENTIAL/PLATELET
Abs Immature Granulocytes: 0.18 10*3/uL — ABNORMAL HIGH (ref 0.00–0.07)
Basophils Absolute: 0 10*3/uL (ref 0.0–0.1)
Basophils Relative: 0 %
Eosinophils Absolute: 0 10*3/uL (ref 0.0–0.5)
Eosinophils Relative: 0 %
HCT: 26.7 % — ABNORMAL LOW (ref 36.0–46.0)
Hemoglobin: 8.9 g/dL — ABNORMAL LOW (ref 12.0–15.0)
Immature Granulocytes: 2 %
Lymphocytes Relative: 9 %
Lymphs Abs: 0.9 10*3/uL (ref 0.7–4.0)
MCH: 30.6 pg (ref 26.0–34.0)
MCHC: 33.3 g/dL (ref 30.0–36.0)
MCV: 91.8 fL (ref 80.0–100.0)
Monocytes Absolute: 0.9 10*3/uL (ref 0.1–1.0)
Monocytes Relative: 10 %
Neutro Abs: 7.8 10*3/uL — ABNORMAL HIGH (ref 1.7–7.7)
Neutrophils Relative %: 79 %
Platelets: 157 10*3/uL (ref 150–400)
RBC: 2.91 MIL/uL — ABNORMAL LOW (ref 3.87–5.11)
RDW: 17.5 % — ABNORMAL HIGH (ref 11.5–15.5)
WBC: 9.8 10*3/uL (ref 4.0–10.5)
nRBC: 0.2 % (ref 0.0–0.2)

## 2021-09-11 LAB — GLUCOSE, CAPILLARY
Glucose-Capillary: 110 mg/dL — ABNORMAL HIGH (ref 70–99)
Glucose-Capillary: 114 mg/dL — ABNORMAL HIGH (ref 70–99)
Glucose-Capillary: 123 mg/dL — ABNORMAL HIGH (ref 70–99)
Glucose-Capillary: 131 mg/dL — ABNORMAL HIGH (ref 70–99)
Glucose-Capillary: 159 mg/dL — ABNORMAL HIGH (ref 70–99)
Glucose-Capillary: 99 mg/dL (ref 70–99)

## 2021-09-11 LAB — BASIC METABOLIC PANEL WITH GFR
Anion gap: 11 (ref 5–15)
BUN: 70 mg/dL — ABNORMAL HIGH (ref 8–23)
CO2: 40 mmol/L — ABNORMAL HIGH (ref 22–32)
Calcium: 7.8 mg/dL — ABNORMAL LOW (ref 8.9–10.3)
Chloride: 92 mmol/L — ABNORMAL LOW (ref 98–111)
Creatinine, Ser: 1.48 mg/dL — ABNORMAL HIGH (ref 0.44–1.00)
GFR, Estimated: 39 mL/min — ABNORMAL LOW (ref >60)
Glucose, Bld: 108 mg/dL — ABNORMAL HIGH (ref 70–99)
Potassium: 4.5 mmol/L (ref 3.5–5.1)
Sodium: 143 mmol/L (ref 135–145)

## 2021-09-11 MED ORDER — AMLODIPINE BESYLATE 10 MG PO TABS
10.0000 mg | ORAL_TABLET | Freq: Every day | ORAL | Status: DC
  Administered 2021-09-11 – 2021-09-13 (×3): 10 mg
  Filled 2021-09-11 (×3): qty 1

## 2021-09-11 MED ORDER — HYDRALAZINE HCL 20 MG/ML IJ SOLN
10.0000 mg | Freq: Four times a day (QID) | INTRAMUSCULAR | Status: DC | PRN
  Administered 2021-09-11: 10 mg via INTRAVENOUS
  Filled 2021-09-11 (×3): qty 1

## 2021-09-11 MED ORDER — METHYLPREDNISOLONE SODIUM SUCC 40 MG IJ SOLR
40.0000 mg | Freq: Every day | INTRAMUSCULAR | Status: DC
  Administered 2021-09-12 – 2021-09-13 (×2): 40 mg via INTRAVENOUS
  Filled 2021-09-11 (×3): qty 1

## 2021-09-11 NOTE — Progress Notes (Signed)
NAME:  Donna Curry, MRN:  [id number], DOB:  06-14-1956, LOS: 5 ADMISSION DATE:  08/29/2021, CONSULTATION DATE:  09/12/2021  REFERRING MD:  Dr. Dina Rich, CHIEF COMPLAINT:  COPD exacerbation    History of Present Illness:  [name] is a 66 year old female with a past medical history of chronic hypoxic respiratory failure 2/2 COPD on home supplemental oxygen (2L), bladder malignancy currently on Keytruda, iron deficiency anemia who presented to the ED with complaints of shortness of breath.  History limited 2/2 emergent intubation and sedation.  Per chart review, patient has been experiencing increasing shortness of breath for several days now.  She has been compliant with her home supplemental oxygen and nebulizer treatments.  On EMS arrival, she was hypoxic at 66% on home oxygen, with tachypnea and tripoding.  Patient was placed on CPAP with improvement in oxygenation up to 95%.  She was subsequently placed on BiPAP however due to worsening work of breathing, intubation was pursued.  Patient has received breathing treatments and high dose steroids.   Pertinent  Medical History   Past Medical History:  Diagnosis Date   Anemia    Asthma    Dyspnea    with exertion    Family history of adverse reaction to anesthesia    brother wakes up and dont know who he is and sister has n/v   Goals of care, counseling/discussion 02/09/2018   History of blood transfusion    Hypertension    Iron deficiency anemia due to chronic blood loss 03/01/2018   Measles as a child   Mumps as a child   TIA (transient ischemic attack)    Tobacco abuse    Significant Hospital Events: Including procedures, antibiotic start and stop dates in addition to other pertinent events   09/13/2021: Presented to the ED. Intubated. Transferred to ICU 09/07/2021: Patient repeatedly fails SBTs. Discussion with family that extubation is unlikely. Family considering comfort care versus trach.  2/16: Acute decompensation in the  early hours with severe acidemia, hypotension  Interim History / Subjective:  0.40, PEEP 8 Precedex 1.2 Fentanyl 200 S Cr 1.79 > 1.48 I/O+ 8.3 L total, -2.4 L last 24 hours  Objective   Blood pressure 126/77, pulse 88, temperature 100 F (37.8 C), temperature source Oral, resp. rate 18, height 5\' 5"  (1.651 m), weight 40 kg, SpO2 94 %.    Vent Mode: PRVC FiO2 (%):  [40 %] 40 % Set Rate:  [18 bmp-28 bmp] 18 bmp Vt Set:  [500 mL] 500 mL PEEP:  [8 cmH20] 8 cmH20 Plateau Pressure:  [14 cmH20-22 cmH20] 18 cmH20   Intake/Output Summary (Last 24 hours) at 09/11/2021 0759 Last data filed at 09/11/2021 0700 Gross per 24 hour  Intake 2157.01 ml  Output 4625 ml  Net -2467.99 ml   Filed Weights   09/09/21 0500 09/10/21 0500 09/11/21 0500  Weight: 40 kg 40 kg 40 kg   Examination: General: Cachectic woman, moving in bed, no distress HENT: ET tube in place, pupils equal Lungs: Very distant, barely any breath sounds heard, no wheezing, no crackle Cardiovascular:, No murmur Abdomen: Nondistended, positive bowel sounds Extremities: Poor muscle mass, no edema Neuro: Tracks, intermittently follows commands.  On Precedex and fentanyl GU: Deferred  Resolved Hospital Problem list   N/A  Assessment & Plan:   # Goals of Care Despite treatment, patient has persistently failed SBT trials due to tachycardia, severe hypertension and agitation; occasionally experiences hypoxia as well. Intermittent vent dyssynchrony with double and triple stacking  requiring intermittent paralysis on 2/15. We have discussed with family that patient is not a candidate for tracheostomy. Will need to continue discussion with family.  -Family understands that we will try and optimize her status for possible extubation.  If she does not tolerate then would transition to comfort  # Acute on Chronic Hypoxic and Hypercapnic Respiratory Failure # Multi-focal CAP # History of COPD  Completed 3 day course of Azithromycin  (2/9 - 2/11) and 5 day course of Ceftriaxone (2/9 - 2-13).  Repeat respiratory cultures, AFB negative -Continue Solu-Medrol and wean over next several days -PRVC as maintenance mode, push for SBT's as she can tolerate -Scheduled bronchodilators as ordered -PAD protocol: Precedex, fentanyl infusions  # Hypotension # History of Hypertension  Resolved with improvement in acidosis.  -Following off pressors, off bicarbonate infusion  # Acute Kidney Injury # Hyperkalemia  Renal function has remained stable. No additional hyperkalemia.  -Foley catheter in place -Follow urine output, BMP -Address electrolytes as indicated   # Acute on Chronic Normocytic Anemia # History of IDA -Following CBC intermittently  # Hypernatremia -Free water as ordered  # Malignant Neoplasm of the Bladder  -Has been treated with Keytruda every 2 months  # Depression -On home Celexa, diazepam available as needed  Best Practice (right click and "Reselect all SmartList Selections" daily)  Diet/type: NPO DVT prophylaxis: Heparin GI prophylaxis: PPI Lines: N/A Foley:  N/A Code Status:  DNR Last date of multidisciplinary goals of care discussion [09/11/2021 with daughter at bedside]  Labs   CBC: Recent Labs  Lab 09/07/21 0030 09/07/21 1041 09/08/21 0753 09/08/21 0913 09/09/21 0119 09/09/21 0321 09/10/21 0223 09/11/21 0245  WBC 8.4  --  6.6  --   --  11.1* 9.0 9.8  NEUTROABS  --   --  4.8  --   --  9.6* 7.9* 7.8*  HGB 8.6*   < > 8.7* 10.9* 9.2* 8.6* 9.0* 8.9*  HCT 28.6*   < > 27.9* 32.0* 27.0* 27.2* 27.9* 26.7*  MCV 101.8*  --  99.3  --   --  96.8 93.9 91.8  PLT 222  --  211  --   --  175 210 157   < > = values in this interval not displayed.   Basic Metabolic Panel: Recent Labs  Lab 09/05/21 0500 09/06/21 0158 09/08/21 0753 09/08/21 0913 09/08/21 2147 09/09/21 0119 09/09/21 0321 09/10/21 0223 09/11/21 0245  NA 148*   < > 144   < > 141 143 147* 145 143  K 5.3*   < > 6.3*   < > >7.5*  4.7 5.2* 4.0 4.5  CL 109   < > 103  --  99  --  98 89* 92*  CO2 30   < > 35*  --  27  --  34* 44* 40*  GLUCOSE 120*   < > 124*  --  116*  --  142* 114* 108*  BUN 47*   < > 46*  --  56*  --  67* 68* 70*  CREATININE 1.48*   < > 1.28*  --  1.84*  --  1.91* 1.79* 1.48*  CALCIUM 8.7*   < > 8.5*  --  8.9  --  8.6* 8.2* 7.8*  MG 2.6*  --   --   --   --   --   --   --   --   PHOS 2.8  --   --   --   --   --   --   --   --    < > =  values in this interval not displayed.   GFR: Estimated Creatinine Clearance: 23.9 mL/min (A) (by C-G formula based on SCr of 1.48 mg/dL (H)). Recent Labs  Lab 09/08/21 0753 09/09/21 0321 09/10/21 0223 09/11/21 0245  WBC 6.6 11.1* 9.0 9.8  LATICACIDVEN 1.0  --   --   --     Liver Function Tests: No results for input(s): AST, ALT, ALKPHOS, BILITOT, PROT, ALBUMIN in the last 168 hours.  No results for input(s): LIPASE, AMYLASE in the last 168 hours. No results for input(s): AMMONIA in the last 168 hours.  ABG    Component Value Date/Time   PHART 7.381 09/09/2021 0119   PCO2ART 76.1 (HH) 09/09/2021 0119   PO2ART 493 (H) 09/09/2021 0119   HCO3 44.8 (H) 09/09/2021 0119   TCO2 47 (H) 09/09/2021 0119   O2SAT 100 09/09/2021 0119     Coagulation Profile: No results for input(s): INR, PROTIME in the last 168 hours.  Cardiac Enzymes: No results for input(s): CKTOTAL, CKMB, CKMBINDEX, TROPONINI in the last 168 hours.  HbA1C: Hgb A1c MFr Bld  Date/Time Value Ref Range Status  08/27/2021 06:32 PM 5.3 4.8 - 5.6 % Final    Comment:    (NOTE) Pre diabetes:          5.7%-6.4%  Diabetes:              >6.4%  Glycemic control for   <7.0% adults with diabetes   12/04/2017 10:40 AM 4.5 (L) 4.6 - 6.5 % Final    Comment:    Glycemic Control Guidelines for People with Diabetes:Non Diabetic:  <6%Goal of Therapy: <7%Additional Action Suggested:  >8%     CBG: Recent Labs  Lab 09/10/21 1613 09/10/21 1923 09/10/21 2324 09/11/21 0326 09/11/21 0728  GLUCAP  159* 113* 155* 110* 114*    Independent CC 32 time   Baltazar Apo, MD, PhD 09/11/2021, 7:59 AM [facility] [phone] or if no answer before 7:00PM call [phone] For any issues after 7:00PM please call eLink [phone]

## 2021-09-12 DIAGNOSIS — J9601 Acute respiratory failure with hypoxia: Secondary | ICD-10-CM | POA: Diagnosis not present

## 2021-09-12 DIAGNOSIS — J441 Chronic obstructive pulmonary disease with (acute) exacerbation: Secondary | ICD-10-CM | POA: Diagnosis not present

## 2021-09-12 LAB — BASIC METABOLIC PANEL
Anion gap: 10 (ref 5–15)
BUN: 60 mg/dL — ABNORMAL HIGH (ref 8–23)
CO2: 36 mmol/L — ABNORMAL HIGH (ref 22–32)
Calcium: 8.5 mg/dL — ABNORMAL LOW (ref 8.9–10.3)
Chloride: 99 mmol/L (ref 98–111)
Creatinine, Ser: 1.17 mg/dL — ABNORMAL HIGH (ref 0.44–1.00)
GFR, Estimated: 52 mL/min — ABNORMAL LOW (ref >60)
Glucose, Bld: 137 mg/dL — ABNORMAL HIGH (ref 70–99)
Potassium: 4 mmol/L (ref 3.5–5.1)
Sodium: 145 mmol/L (ref 135–145)

## 2021-09-12 LAB — GLUCOSE, CAPILLARY
Glucose-Capillary: 106 mg/dL — ABNORMAL HIGH (ref 70–99)
Glucose-Capillary: 113 mg/dL — ABNORMAL HIGH (ref 70–99)
Glucose-Capillary: 123 mg/dL — ABNORMAL HIGH (ref 70–99)
Glucose-Capillary: 137 mg/dL — ABNORMAL HIGH (ref 70–99)
Glucose-Capillary: 151 mg/dL — ABNORMAL HIGH (ref 70–99)
Glucose-Capillary: 160 mg/dL — ABNORMAL HIGH (ref 70–99)

## 2021-09-12 MED ORDER — NOREPINEPHRINE 4 MG/250ML-% IV SOLN
0.0000 ug/min | INTRAVENOUS | Status: DC
  Administered 2021-09-12: 2 ug/min via INTRAVENOUS
  Filled 2021-09-12: qty 250

## 2021-09-12 NOTE — Plan of Care (Signed)
PCCM Interval Note  I spoke with patient's family at bedside to review her status, current issues and care.  Explained that the patient has been unable to tolerate any extended period of spontaneous breathing, has not tolerated decreases in Precedex, fentanyl.  Our initial goal had been to push for an opportunity for one-way extubation, hopefully for success but acknowledging that she may not do so, may transition to comfort.  Based on the last 2 days I have informed them that I do not think an extubation for success is realistic and that I would therefore recommend a formal transition to comfort care and a word compassionate extubation.  They understood this information, are processing it and will speak with other family. I have asked him to give [place] any feedback regarding whether they would like to pursue a transition to comfort, extubation to comfort.  May be some benefit to having Palliative Care input for emotional support.   Independent CC time 30 minutes  Baltazar Apo, MD, PhD 09/12/2021, 4:06 PM [facility] [phone] or if no answer before 7:00PM call [phone] For any issues after 7:00PM please call eLink [phone]

## 2021-09-12 NOTE — Progress Notes (Signed)
NAME:  Donna Curry, MRN:  [id number], DOB:  07-16-1956, LOS: 79 ADMISSION DATE:  09/05/2021, CONSULTATION DATE:  09/14/2021  REFERRING MD:  Dr. Dina Rich, CHIEF COMPLAINT:  COPD exacerbation    History of Present Illness:  Donna Curry is a 66 year old female with a past medical history of chronic hypoxic respiratory failure 2/2 COPD on home supplemental oxygen (2L), bladder malignancy currently on Keytruda, iron deficiency anemia who presented to the ED with complaints of shortness of breath.  History limited 2/2 emergent intubation and sedation.  Per chart review, patient has been experiencing increasing shortness of breath for several days now.  Donna Curry has been compliant with her home supplemental oxygen and nebulizer treatments.  On EMS arrival, Donna Curry was hypoxic at 66% on home oxygen, with tachypnea and tripoding.  Patient was placed on CPAP with improvement in oxygenation up to 95%.  Donna Curry was subsequently placed on BiPAP however due to worsening work of breathing, intubation was pursued.  Patient has received breathing treatments and high dose steroids.   Pertinent  Medical History   Past Medical History:  Diagnosis Date   Anemia    Asthma    Dyspnea    with exertion    Family history of adverse reaction to anesthesia    brother wakes up and dont know who he is and sister has n/v   Goals of care, counseling/discussion 02/09/2018   History of blood transfusion    Hypertension    Iron deficiency anemia due to chronic blood loss 03/01/2018   Measles as a child   Mumps as a child   TIA (transient ischemic attack)    Tobacco abuse    Significant Hospital Events: Including procedures, antibiotic start and stop dates in addition to other pertinent events   09/20/2021: Presented to the ED. Intubated. Transferred to ICU 09/07/2021: Patient repeatedly fails SBTs. Discussion with family that extubation is unlikely. Family considering comfort care versus trach.  2/16: Acute decompensation in the  early hours with severe acidemia, hypotension  Interim History / Subjective:   Donna Curry did not tolerate SBT on 2/18 or this morning due to tachypnea, associated desaturations. Precedex 1.2, fentanyl 100 >> 250 I/O+ 5.5 L total, urine output 4.47 L last 24 hours Serum creatinine improving, 1.17 <1.48    Objective   Blood pressure (!) 71/50, pulse 93, temperature 99.4 F (37.4 C), temperature source Oral, resp. rate 18, height 5\' 5"  (1.651 m), weight 40 kg, SpO2 93 %.    Vent Mode: PRVC FiO2 (%):  [40 %] 40 % Set Rate:  [18 bmp] 18 bmp Vt Set:  [500 mL] 500 mL PEEP:  [8 cmH20] 8 cmH20 Pressure Support:  [8 cmH20] 8 cmH20 Plateau Pressure:  [16 cmH20] 16 cmH20   Intake/Output Summary (Last 24 hours) at 09/12/2021 1024 Last data filed at 09/12/2021 1000 Gross per 24 hour  Intake 1830.39 ml  Output 4315 ml  Net -2484.61 ml   Filed Weights   09/10/21 0500 09/11/21 0500 09/12/21 0500  Weight: 40 kg 40 kg 40 kg   Examination: General: Cachectic ill-appearing woman ventilated HENT: ET tube in good position, oropharynx otherwise clear, small pupils that do not react Lungs: Very distant, barely any breath sounds heard, no wheezes or crackles Cardiovascular: Distant, regular, no murmur Abdomen: Nondistended positive bowel sounds Extremities: Poor muscle mass, no edema Neuro: Donna Curry will open eyes to voice briefly, appears to track, did not follow commands GU: Deferred  Resolved Hospital Problem list   N/A  Assessment & Plan:   # Goals of Care Despite treatment, patient has persistently failed SBT trials due to tachycardia, severe hypertension and agitation; occasionally experiences hypoxia as well.  No change in this pattern 2/18, 2/19.  Unfortunately I do not believe Donna Curry is going to be able to extubate for possible success.  Believe we will need to instead plan for a transition to comfort and compassionate extubation.  Have explained to the patient's family that extubation for success  is starting to look unrealistic  # Acute on Chronic Hypoxic and Hypercapnic Respiratory Failure # Multi-focal CAP # History of COPD  Completed 3 day course of Azithromycin (2/9 - 2/11) and 5 day course of Ceftriaxone (2/9 - 2-13).  Repeat respiratory cultures, AFB negative -Plan to continue to wean Solu-Medrol -PRVC as maintenance mode.  Donna Curry has not tolerated PSV or wake up -Scheduled bronchodilators as ordered -PAD protocol: Precedex, fentanyl infusions  # Hypotension, resolved # History of Hypertension  Resolved with improvement in acidosis.  -Following  # Acute Kidney Injury, improved, auto diuresing # Hyperkalemia  Renal function has remained stable. No additional hyperkalemia.  -Foley catheter in place -Follow urine output, BMP -Address electrolytes as indicated  # Acute on Chronic Normocytic Anemia # History of IDA -Follow CBC intermittently  # Hypernatremia -Free water as ordered  # Malignant Neoplasm of the Bladder  -Has been treated with Keytruda every 2 months  # Depression -On home Celexa.  Has diazepam available as needed  Best Practice (right click and "Reselect all SmartList Selections" daily)  Diet/type: NPO DVT prophylaxis: Heparin GI prophylaxis: PPI Lines: N/A Foley:  N/A Code Status:  DNR Last date of multidisciplinary goals of care discussion [09/11/2021 with daughter at bedside]  Labs   CBC: Recent Labs  Lab 09/07/21 0030 09/07/21 1041 09/08/21 0753 09/08/21 0913 09/09/21 0119 09/09/21 0321 09/10/21 0223 09/11/21 0245  WBC 8.4  --  6.6  --   --  11.1* 9.0 9.8  NEUTROABS  --   --  4.8  --   --  9.6* 7.9* 7.8*  HGB 8.6*   < > 8.7* 10.9* 9.2* 8.6* 9.0* 8.9*  HCT 28.6*   < > 27.9* 32.0* 27.0* 27.2* 27.9* 26.7*  MCV 101.8*  --  99.3  --   --  96.8 93.9 91.8  PLT 222  --  211  --   --  175 210 157   < > = values in this interval not displayed.   Basic Metabolic Panel: Recent Labs  Lab 09/08/21 2147 09/09/21 0119 09/09/21 0321  09/10/21 0223 09/11/21 0245 09/12/21 0124  NA 141 143 147* 145 143 145  K >7.5* 4.7 5.2* 4.0 4.5 4.0  CL 99  --  98 89* 92* 99  CO2 27  --  34* 44* 40* 36*  GLUCOSE 116*  --  142* 114* 108* 137*  BUN 56*  --  67* 68* 70* 60*  CREATININE 1.84*  --  1.91* 1.79* 1.48* 1.17*  CALCIUM 8.9  --  8.6* 8.2* 7.8* 8.5*   GFR: Estimated Creatinine Clearance: 30.3 mL/min (A) (by C-G formula based on SCr of 1.17 mg/dL (H)). Recent Labs  Lab 09/08/21 0753 09/09/21 0321 09/10/21 0223 09/11/21 0245  WBC 6.6 11.1* 9.0 9.8  LATICACIDVEN 1.0  --   --   --     Liver Function Tests: No results for input(s): AST, ALT, ALKPHOS, BILITOT, PROT, ALBUMIN in the last 168 hours.  No results for input(s): LIPASE, AMYLASE in  the last 168 hours. No results for input(s): AMMONIA in the last 168 hours.  ABG    Component Value Date/Time   PHART 7.381 09/09/2021 0119   PCO2ART 76.1 (HH) 09/09/2021 0119   PO2ART 493 (H) 09/09/2021 0119   HCO3 44.8 (H) 09/09/2021 0119   TCO2 47 (H) 09/09/2021 0119   O2SAT 100 09/09/2021 0119     Coagulation Profile: No results for input(s): INR, PROTIME in the last 168 hours.  Cardiac Enzymes: No results for input(s): CKTOTAL, CKMB, CKMBINDEX, TROPONINI in the last 168 hours.  HbA1C: Hgb A1c MFr Bld  Date/Time Value Ref Range Status  09/06/2021 06:32 PM 5.3 4.8 - 5.6 % Final    Comment:    (NOTE) Pre diabetes:          5.7%-6.4%  Diabetes:              >6.4%  Glycemic control for   <7.0% adults with diabetes   12/04/2017 10:40 AM 4.5 (L) 4.6 - 6.5 % Final    Comment:    Glycemic Control Guidelines for People with Diabetes:Non Diabetic:  <6%Goal of Therapy: <7%Additional Action Suggested:  >8%     CBG: Recent Labs  Lab 09/11/21 1527 09/11/21 1916 09/11/21 2334 09/12/21 0317 09/12/21 0730  GLUCAP 159* 131* 99 123* 113*    Independent CC 31 time   Baltazar Apo, MD, PhD 09/12/2021, 10:24 AM [facility] [phone] or if  no answer before 7:00PM call [phone] For any issues after 7:00PM please call eLink [phone]

## 2021-09-13 DIAGNOSIS — A419 Sepsis, unspecified organism: Secondary | ICD-10-CM

## 2021-09-13 DIAGNOSIS — N170 Acute kidney failure with tubular necrosis: Secondary | ICD-10-CM | POA: Diagnosis not present

## 2021-09-13 DIAGNOSIS — J441 Chronic obstructive pulmonary disease with (acute) exacerbation: Secondary | ICD-10-CM | POA: Diagnosis not present

## 2021-09-13 DIAGNOSIS — J189 Pneumonia, unspecified organism: Secondary | ICD-10-CM

## 2021-09-13 DIAGNOSIS — J9601 Acute respiratory failure with hypoxia: Secondary | ICD-10-CM | POA: Diagnosis not present

## 2021-09-13 DIAGNOSIS — Z7189 Other specified counseling: Secondary | ICD-10-CM

## 2021-09-13 DIAGNOSIS — N179 Acute kidney failure, unspecified: Secondary | ICD-10-CM

## 2021-09-13 LAB — CBC
HCT: 27.1 % — ABNORMAL LOW (ref 36.0–46.0)
Hemoglobin: 8.6 g/dL — ABNORMAL LOW (ref 12.0–15.0)
MCH: 31.2 pg (ref 26.0–34.0)
MCHC: 31.7 g/dL (ref 30.0–36.0)
MCV: 98.2 fL (ref 80.0–100.0)
Platelets: 170 10*3/uL (ref 150–400)
RBC: 2.76 MIL/uL — ABNORMAL LOW (ref 3.87–5.11)
RDW: 18.6 % — ABNORMAL HIGH (ref 11.5–15.5)
WBC: 11.3 10*3/uL — ABNORMAL HIGH (ref 4.0–10.5)
nRBC: 0.2 % (ref 0.0–0.2)

## 2021-09-13 LAB — BASIC METABOLIC PANEL
Anion gap: 9 (ref 5–15)
BUN: 53 mg/dL — ABNORMAL HIGH (ref 8–23)
CO2: 34 mmol/L — ABNORMAL HIGH (ref 22–32)
Calcium: 8.5 mg/dL — ABNORMAL LOW (ref 8.9–10.3)
Chloride: 103 mmol/L (ref 98–111)
Creatinine, Ser: 1.15 mg/dL — ABNORMAL HIGH (ref 0.44–1.00)
GFR, Estimated: 53 mL/min — ABNORMAL LOW (ref >60)
Glucose, Bld: 108 mg/dL — ABNORMAL HIGH (ref 70–99)
Potassium: 4 mmol/L (ref 3.5–5.1)
Sodium: 146 mmol/L — ABNORMAL HIGH (ref 135–145)

## 2021-09-13 LAB — GLUCOSE, CAPILLARY
Glucose-Capillary: 113 mg/dL — ABNORMAL HIGH (ref 70–99)
Glucose-Capillary: 117 mg/dL — ABNORMAL HIGH (ref 70–99)
Glucose-Capillary: 135 mg/dL — ABNORMAL HIGH (ref 70–99)
Glucose-Capillary: 139 mg/dL — ABNORMAL HIGH (ref 70–99)
Glucose-Capillary: 147 mg/dL — ABNORMAL HIGH (ref 70–99)
Glucose-Capillary: 180 mg/dL — ABNORMAL HIGH (ref 70–99)
Glucose-Capillary: 60 mg/dL — ABNORMAL LOW (ref 70–99)

## 2021-09-13 LAB — POCT I-STAT 7, (LYTES, BLD GAS, ICA,H+H)
Acid-Base Excess: 12 mmol/L — ABNORMAL HIGH (ref 0.0–2.0)
Bicarbonate: 37.7 mmol/L — ABNORMAL HIGH (ref 20.0–28.0)
Calcium, Ion: 1.24 mmol/L (ref 1.15–1.40)
HCT: 26 % — ABNORMAL LOW (ref 36.0–46.0)
Hemoglobin: 8.8 g/dL — ABNORMAL LOW (ref 12.0–15.0)
O2 Saturation: 91 %
Patient temperature: 102.3
Potassium: 3.9 mmol/L (ref 3.5–5.1)
Sodium: 145 mmol/L (ref 135–145)
TCO2: 39 mmol/L — ABNORMAL HIGH (ref 22–32)
pCO2 arterial: 64.5 mmHg — ABNORMAL HIGH (ref 32–48)
pH, Arterial: 7.383 (ref 7.35–7.45)
pO2, Arterial: 73 mmHg — ABNORMAL LOW (ref 83–108)

## 2021-09-13 MED ORDER — LACTATED RINGERS IV BOLUS: 1000.0000 mL | Freq: Once | INTRAVENOUS | Status: DC

## 2021-09-13 MED ORDER — DEXMEDETOMIDINE HCL IN NACL 400 MCG/100ML IV SOLN
0.4000 ug/kg/h | INTRAVENOUS | Status: DC
  Administered 2021-09-13 – 2021-09-14 (×2): 1 ug/kg/h via INTRAVENOUS
  Filled 2021-09-13: qty 100

## 2021-09-13 MED ORDER — VANCOMYCIN HCL 750 MG/150ML IV SOLN
750.0000 mg | Freq: Once | INTRAVENOUS | Status: AC
  Administered 2021-09-13: 750 mg via INTRAVENOUS
  Filled 2021-09-13: qty 150

## 2021-09-13 MED ORDER — VANCOMYCIN HCL 500 MG/100ML IV SOLN: 500.0000 mg | INTRAVENOUS | Status: DC

## 2021-09-13 MED ORDER — SODIUM CHLORIDE 0.9 % IV SOLN
1.0000 g | INTRAVENOUS | Status: DC
  Administered 2021-09-13: 1 g via INTRAVENOUS
  Filled 2021-09-13 (×2): qty 1

## 2021-09-13 MED ORDER — DEXTROSE 50 % IV SOLN
INTRAVENOUS | Status: AC
  Filled 2021-09-13: qty 50

## 2021-09-13 MED ORDER — DEXTROSE 50 % IV SOLN
12.5000 g | INTRAVENOUS | Status: AC
  Administered 2021-09-13: 12.5 g via INTRAVENOUS

## 2021-09-13 MED ORDER — ACETAMINOPHEN 325 MG PO TABS
650.0000 mg | ORAL_TABLET | ORAL | Status: DC | PRN
  Administered 2021-09-13 – 2021-09-14 (×2): 650 mg
  Filled 2021-09-13 (×2): qty 2

## 2021-09-13 NOTE — Progress Notes (Addendum)
Consultation Note Date: 09/13/2021   Patient Name: Donna Curry  DOB: 1955-09-23  MRN: [id number]  Age / Sex: 66 y.o., female  PCP: [name], NP Referring Physician: Jacky Kindle, MD  Reason for Consultation: Goals of care  HPI/Patient Profile: 66 y.o. female  with past medical history of COPD, bladder cancer on keytruda admitted on 09/12/2021 with acute on chronic respiratory failure in the setting of severe COPD exacerbation. CTA with concerns for multifocal pneumonia. She continues to be intubated, not tolerating weaning trials, not a candidate for trach. PCCM has initiated goals of care discussions and offered transition to comfort. Palliative medicine consulted for continued GOC.   Primary Decision Maker NEXT OF KIN - daughter Donna Curry  Discussion: Chart reviewed including progress notes, imaging, labs.  Evaluated patient, she is arouseable at times, does not follow commands.  Her sister and nephew are at bedside, but they are not decision makers.  Called patient's daughterLarena Curry.  Reviewed Alinda's condition, results of SBT's and possible trajectories. Donna Curry shares she is scared, not sleeping, constantly in fear of someone calling and telling her that her mother has died.  She understands her mother is in a difficult situation and that it is not likely that she will come off the ventilator, however, she is not yet prepared to make any final decisions.  I introduced Palliative medicine and offered to meet face to face for further discussion and support through this complicated medical decision making.     SUMMARY OF RECOMMENDATIONS -Donna Curry not ready to make decision per our phone discussion -Plan made to meet at 2pm at bedside for face to face discussion, however, Donna Curry did not show as planned - PMT will try to meet with her tomorrow- recommend attending team continue [place] discussions  also as able    Addendum- I was able to meet with Donna Curry at a later time. After much discussion, review of her disease process and likely trajectory-- discussion also with Fabian's aunt- family continues to request ongoing life proloning care including trach and possible LTC placement.   Code Status/Advance Care Planning: DNR   Prognosis:   Unable to determine  Discharge Planning: To Be Determined  Primary Diagnoses: Present on Admission:  Acute respiratory failure with hypoxia ([place])   Review of Systems  Physical Exam Vitals and nursing note reviewed.  Constitutional:      Appearance: She is ill-appearing.     Comments: Frail, cachetic  Neurological:     Comments: Does not follow commands    Vital Signs: BP 91/62    Pulse (!) 116    Temp (!) 101.8 F (38.8 C) (Oral)    Resp (!) 23    Ht 5\' 5"  (1.651 m)    Wt 32 kg    SpO2 96%    BMI 11.74 kg/m  Pain Scale: CPOT       SpO2: SpO2: 96 % O2 Device:SpO2: 96 % O2 Flow Rate: .   IO: Intake/output summary:  Intake/Output Summary (Last 24 hours) at 09/13/2021 1541 Last  data filed at 09/13/2021 1232 Gross per 24 hour  Intake 2216.82 ml  Output 1675 ml  Net 541.82 ml    LBM: Last BM Date : 09/13/21 Baseline Weight: Weight: 33.7 kg Most recent weight: Weight: 32 kg        Thank you for this consult. Palliative medicine will continue to follow and assist as needed.    Signed by: Mariana Kaufman, AGNP-C Palliative Medicine  Total time: 150 minutes  Please contact Palliative Medicine Team phone at [phone] for questions and concerns.  For individual provider: See Shea Evans

## 2021-09-13 NOTE — Progress Notes (Signed)
Pharmacy Antibiotic Note  SUHA SCHOENBECK is a 66 y.o. female with possibe sepsis.  Pharmacy has been consulted for cefepime and vancomycin dosing. -WBC= 11.2, tmax= 102.3 -SCr= 1.15; CrCl ~ 25 (patient wt 32kg)  Plan: -Cefepime 1gm IV q24h -Vancomycin 750mg  IV x1 followed by 500mg  IV q24h (Estimated AUC of 430 using SCr of 1.15 and body weight of 32kg) -Will follow renal function, cultures and clinical progress    Height: 5\' 5"  (165.1 cm) Weight: 32 kg (70 lb 8.8 oz) IBW/kg (Calculated) : 57  Temp (24hrs), Avg:100.4 F (38 C), Min:99 F (37.2 C), Max:102.3 F (39.1 C)  Recent Labs  Lab 09/08/21 0753 09/08/21 2147 09/09/21 0321 09/10/21 0223 09/11/21 0245 09/12/21 0124 09/13/21 0204  WBC 6.6  --  11.1* 9.0 9.8  --  11.3*  CREATININE 1.28*   < > 1.91* 1.79* 1.48* 1.17* 1.15*  LATICACIDVEN 1.0  --   --   --   --   --   --    < > = values in this interval not displayed.    Estimated Creatinine Clearance: 24.6 mL/min (A) (by C-G formula based on SCr of 1.15 mg/dL (H)).    No Known Allergies  Antimicrobials this admission: 2/9 Azith>>2/11 2/9 Rocephin >>2/13 2/20 cefepime 2/20 vanc>>  Dose adjustments this admission:   Microbiology results: 2/9 MRSA PCR: negative 2/9: Trach asp: negative  Thank you for allowing pharmacy to be a part of this patients care.  Hildred Laser, PharmD Clinical Pharmacist **Pharmacist phone directory can now be found on [email or web address] ([email or web address]).  Listed under [place].

## 2021-09-13 NOTE — Progress Notes (Signed)
NAME:  Donna Curry, MRN:  [id number], DOB:  [birth year], LOS: 47 ADMISSION DATE:  09/18/2021, CONSULTATION DATE:  09/01/2021  REFERRING MD:  Dr. Dina Rich, CHIEF COMPLAINT:  COPD exacerbation    History of Present Illness:  [name] is a 66 year old female with a past medical history of chronic hypoxic respiratory failure 2/2 COPD on home supplemental oxygen (2L), bladder malignancy currently on Keytruda, iron deficiency anemia who presented to the ED with complaints of shortness of breath.  History limited 2/2 emergent intubation and sedation.  Per chart review, patient has been experiencing increasing shortness of breath for several days now.  She has been compliant with her home supplemental oxygen and nebulizer treatments.  On EMS arrival, she was hypoxic at 66% on home oxygen, with tachypnea and tripoding.  Patient was placed on CPAP with improvement in oxygenation up to 95%.  She was subsequently placed on BiPAP however due to worsening work of breathing, intubation was pursued.  Patient has received breathing treatments and high dose steroids.   Pertinent  Medical History   Past Medical History:  Diagnosis Date   Anemia    Asthma    Dyspnea    with exertion    Family history of adverse reaction to anesthesia    brother wakes up and dont know who he is and sister has n/v   Goals of care, counseling/discussion 02/09/2018   History of blood transfusion    Hypertension    Iron deficiency anemia due to chronic blood loss 03/01/2018   Measles as a child   Mumps as a child   TIA (transient ischemic attack)    Tobacco abuse    Significant Hospital Events: Including procedures, antibiotic start and stop dates in addition to other pertinent events   09/03/2021: Presented to the ED. Intubated. Transferred to ICU 09/07/2021: Patient repeatedly fails SBTs. Discussion with family that extubation is unlikely. Family considering comfort care versus trach.  2/16: Acute decompensation in the  early hours with severe acidemia, hypotension  Interim History / Subjective:   No overnight events. Tachycardia with some hypotension noted (BP 88/61). Awake but not tracking.   Objective   Blood pressure (!) 88/61, pulse (!) 112, temperature 99.7 F (37.6 C), temperature source Axillary, resp. rate 19, height 5\' 5"  (1.651 m), weight 32 kg, SpO2 96 %.    Vent Mode: PRVC FiO2 (%):  [40 %] 40 % Set Rate:  [18 bmp] 18 bmp Vt Set:  [500 mL] 500 mL PEEP:  [8 cmH20] 8 cmH20 Plateau Pressure:  [12 cmH20-20 cmH20] 20 cmH20   Intake/Output Summary (Last 24 hours) at 09/13/2021 0902 Last data filed at 09/13/2021 5462 Gross per 24 hour  Intake 1629.79 ml  Output 2025 ml  Net -395.21 ml    Filed Weights   09/11/21 0500 09/12/21 0500 09/13/21 0500  Weight: 40 kg 40 kg 32 kg   Examination: General: Cachectic, ill-appearing woman ventilated.  HENT: ET tube in good position Lungs: Very distant breathe sounds but no wheezing, rales, rhonchi Cardiovascular: Distant, regular rhythm with tachycardia, no murmur Abdomen: Non-distended, positive bowel sounds Extremities: Poor muscle mass, no edema Neuro: She will open eyes to voice, but not tracking or following commands  GU: Deferred  Resolved Hospital Problem list   N/A  Assessment & Plan:   # Goals of Care Despite treatment, patient has persistently failed SBT trials due to tachycardia, severe hypertension and agitation; occasionally experiences hypoxia as well.  Unfortunately, very low likelihood of successful  extubation. This has been expressed to family over the last several days; daughter is having difficulty with decision to transition to comfort. Patient is not a candidate for trach.   - Will consider Palliative consult today   # Acute on Chronic Hypoxic and Hypercapnic Respiratory Failure # Multi-focal CAP # History of COPD  Completed 3 day course of Azithromycin (2/9 - 2/11) and 5 day course of Ceftriaxone (2/9 - 2-13).  Repeat  respiratory cultures, AFB negative  -Continue to wean Solu-Medrol. Currently 40 mg IV daily.  -Continue full vent support -Daily SBT as tolerated although she has not tolerated well so far -Scheduled bronchodilators as ordered -PAD protocol: Precedex, fentanyl infusions  # Hypotension # History of Hypertension  Hypotensive today. ABG without acidosis.   - Hold Amlodipine   # Acute Kidney Injury, improved # Hyperkalemia, resolved  - Foley catheter in place - Follow urine output, BMP - Address electrolytes as indicated  # Acute on Chronic Normocytic Anemia # History of IDA -Follow CBC intermittently  # Hypernatremia -Free water as ordered  # Malignant Neoplasm of the Bladder  -Has been treated with Keytruda every 2 months  # Depression -On home Celexa.  Has diazepam available as needed  Best Practice (right click and "Reselect all SmartList Selections" daily)  Diet/type: NPO DVT prophylaxis: Heparin GI prophylaxis: PPI Lines: N/A Foley:  N/A Code Status:  DNR Last date of multidisciplinary goals of care discussion [09/13/2021]  Labs   CBC: Recent Labs  Lab 09/08/21 0753 09/08/21 0913 09/09/21 0119 09/09/21 0321 09/10/21 0223 09/11/21 0245 09/13/21 0204  WBC 6.6  --   --  11.1* 9.0 9.8 11.3*  NEUTROABS 4.8  --   --  9.6* 7.9* 7.8*  --   HGB 8.7*   < > 9.2* 8.6* 9.0* 8.9* 8.6*  HCT 27.9*   < > 27.0* 27.2* 27.9* 26.7* 27.1*  MCV 99.3  --   --  96.8 93.9 91.8 98.2  PLT 211  --   --  175 210 157 170   < > = values in this interval not displayed.    Basic Metabolic Panel: Recent Labs  Lab 09/09/21 0321 09/10/21 0223 09/11/21 0245 09/12/21 0124 09/13/21 0204  NA 147* 145 143 145 146*  K 5.2* 4.0 4.5 4.0 4.0  CL 98 89* 92* 99 103  CO2 34* 44* 40* 36* 34*  GLUCOSE 142* 114* 108* 137* 108*  BUN 67* 68* 70* 60* 53*  CREATININE 1.91* 1.79* 1.48* 1.17* 1.15*  CALCIUM 8.6* 8.2* 7.8* 8.5* 8.5*    GFR: Estimated Creatinine Clearance: 24.6 mL/min (A) (by  C-G formula based on SCr of 1.15 mg/dL (H)). Recent Labs  Lab 09/08/21 0753 09/09/21 0321 09/10/21 0223 09/11/21 0245 09/13/21 0204  WBC 6.6 11.1* 9.0 9.8 11.3*  LATICACIDVEN 1.0  --   --   --   --      Liver Function Tests: No results for input(s): AST, ALT, ALKPHOS, BILITOT, PROT, ALBUMIN in the last 168 hours.  No results for input(s): LIPASE, AMYLASE in the last 168 hours. No results for input(s): AMMONIA in the last 168 hours.  ABG    Component Value Date/Time   PHART 7.381 09/09/2021 0119   PCO2ART 76.1 (HH) 09/09/2021 0119   PO2ART 493 (H) 09/09/2021 0119   HCO3 44.8 (H) 09/09/2021 0119   TCO2 47 (H) 09/09/2021 0119   O2SAT 100 09/09/2021 0119     Coagulation Profile: No results for input(s): INR, PROTIME in the last  168 hours.  Cardiac Enzymes: No results for input(s): CKTOTAL, CKMB, CKMBINDEX, TROPONINI in the last 168 hours.  HbA1C: Hgb A1c MFr Bld  Date/Time Value Ref Range Status  09/01/2021 06:32 PM 5.3 4.8 - 5.6 % Final    Comment:    (NOTE) Pre diabetes:          5.7%-6.4%  Diabetes:              >6.4%  Glycemic control for   <7.0% adults with diabetes   12/04/2017 10:40 AM 4.5 (L) 4.6 - 6.5 % Final    Comment:    Glycemic Control Guidelines for People with Diabetes:Non Diabetic:  <6%Goal of Therapy: <7%Additional Action Suggested:  >8%     CBG: Recent Labs  Lab 09/12/21 1119 09/12/21 1516 09/12/21 1936 09/12/21 2331 09/13/21 0317  GLUCAP 160* 151* 106* 137* 113*    Dr. Jose Persia Internal Medicine PGY-3  09/13/2021, 12:31 PM

## 2021-09-14 ENCOUNTER — Inpatient Hospital Stay (HOSPITAL_COMMUNITY): Payer: Medicare Other

## 2021-09-14 DIAGNOSIS — C679 Malignant neoplasm of bladder, unspecified: Secondary | ICD-10-CM | POA: Diagnosis not present

## 2021-09-14 DIAGNOSIS — Z87891 Personal history of nicotine dependence: Secondary | ICD-10-CM

## 2021-09-14 DIAGNOSIS — L8990 Pressure ulcer of unspecified site, unspecified stage: Secondary | ICD-10-CM | POA: Insufficient documentation

## 2021-09-14 DIAGNOSIS — J9601 Acute respiratory failure with hypoxia: Secondary | ICD-10-CM | POA: Diagnosis not present

## 2021-09-14 DIAGNOSIS — N170 Acute kidney failure with tubular necrosis: Secondary | ICD-10-CM | POA: Diagnosis not present

## 2021-09-14 DIAGNOSIS — R0902 Hypoxemia: Secondary | ICD-10-CM

## 2021-09-14 DIAGNOSIS — J441 Chronic obstructive pulmonary disease with (acute) exacerbation: Secondary | ICD-10-CM | POA: Diagnosis not present

## 2021-09-14 LAB — CBC WITH DIFFERENTIAL/PLATELET
Abs Immature Granulocytes: 0 10*3/uL (ref 0.00–0.07)
Basophils Absolute: 0 10*3/uL (ref 0.0–0.1)
Basophils Relative: 0 %
Eosinophils Absolute: 0 10*3/uL (ref 0.0–0.5)
Eosinophils Relative: 0 %
HCT: 23.5 % — ABNORMAL LOW (ref 36.0–46.0)
Hemoglobin: 7.1 g/dL — ABNORMAL LOW (ref 12.0–15.0)
Lymphocytes Relative: 2 %
Lymphs Abs: 0.3 10*3/uL — ABNORMAL LOW (ref 0.7–4.0)
MCH: 30.1 pg (ref 26.0–34.0)
MCHC: 30.2 g/dL (ref 30.0–36.0)
MCV: 99.6 fL (ref 80.0–100.0)
Monocytes Absolute: 0.7 10*3/uL (ref 0.1–1.0)
Monocytes Relative: 4 %
Neutro Abs: 15.5 10*3/uL — ABNORMAL HIGH (ref 1.7–7.7)
Neutrophils Relative %: 94 %
Platelets: 158 10*3/uL (ref 150–400)
RBC: 2.36 MIL/uL — ABNORMAL LOW (ref 3.87–5.11)
RDW: 18.7 % — ABNORMAL HIGH (ref 11.5–15.5)
WBC: 16.5 10*3/uL — ABNORMAL HIGH (ref 4.0–10.5)
nRBC: 0 % (ref 0.0–0.2)
nRBC: 0 /100 WBC

## 2021-09-14 LAB — COMPREHENSIVE METABOLIC PANEL
ALT: 1028 U/L — ABNORMAL HIGH (ref 0–44)
AST: 126 U/L — ABNORMAL HIGH (ref 15–41)
Albumin: 2.3 g/dL — ABNORMAL LOW (ref 3.5–5.0)
Alkaline Phosphatase: 106 U/L (ref 38–126)
Anion gap: 8 (ref 5–15)
BUN: 50 mg/dL — ABNORMAL HIGH (ref 8–23)
CO2: 34 mmol/L — ABNORMAL HIGH (ref 22–32)
Calcium: 8.5 mg/dL — ABNORMAL LOW (ref 8.9–10.3)
Chloride: 105 mmol/L (ref 98–111)
Creatinine, Ser: 1.07 mg/dL — ABNORMAL HIGH (ref 0.44–1.00)
GFR, Estimated: 58 mL/min — ABNORMAL LOW (ref >60)
Glucose, Bld: 139 mg/dL — ABNORMAL HIGH (ref 70–99)
Potassium: 4.3 mmol/L (ref 3.5–5.1)
Sodium: 147 mmol/L — ABNORMAL HIGH (ref 135–145)
Total Bilirubin: 0.6 mg/dL (ref 0.3–1.2)
Total Protein: 5.4 g/dL — ABNORMAL LOW (ref 6.5–8.1)

## 2021-09-14 LAB — GLUCOSE, CAPILLARY
Glucose-Capillary: 124 mg/dL — ABNORMAL HIGH (ref 70–99)
Glucose-Capillary: 132 mg/dL — ABNORMAL HIGH (ref 70–99)
Glucose-Capillary: 103 mg/dL — ABNORMAL HIGH (ref 70–99)
Glucose-Capillary: 199 mg/dL — ABNORMAL HIGH (ref 70–99)
Glucose-Capillary: 113 mg/dL — ABNORMAL HIGH (ref 70–99)
Glucose-Capillary: 133 mg/dL — ABNORMAL HIGH (ref 70–99)

## 2021-09-14 LAB — URINALYSIS, ROUTINE W REFLEX MICROSCOPIC
Bilirubin Urine: NEGATIVE
Glucose, UA: NEGATIVE mg/dL
Ketones, ur: NEGATIVE mg/dL
Leukocytes,Ua: NEGATIVE
Nitrite: NEGATIVE
Protein, ur: 30 mg/dL — AB
Specific Gravity, Urine: 1.014 (ref 1.005–1.030)
pH: 5 (ref 5.0–8.0)

## 2021-09-14 LAB — MRSA NEXT GEN BY PCR, NASAL: MRSA by PCR Next Gen: NOT DETECTED

## 2021-09-14 LAB — IRON AND TIBC
Iron: 12 ug/dL — ABNORMAL LOW (ref 28–170)
Saturation Ratios: 6 % — ABNORMAL LOW (ref 10.4–31.8)
TIBC: 207 ug/dL — ABNORMAL LOW (ref 250–450)
UIBC: 195 ug/dL

## 2021-09-14 MED ORDER — CLONAZEPAM 1 MG PO TABS
1.0000 mg | ORAL_TABLET | Freq: Two times a day (BID) | ORAL | Status: DC | PRN
Start: 2021-09-14 — End: 2021-09-16
  Administered 2021-09-14 – 2021-09-15 (×2): 1 mg via ORAL
  Filled 2021-09-14 (×2): qty 1

## 2021-09-14 MED ORDER — QUETIAPINE FUMARATE 25 MG PO TABS: 25.0000 mg | ORAL_TABLET | Freq: Two times a day (BID) | ORAL | Status: DC

## 2021-09-14 MED ORDER — FREE WATER
200.0000 mL | Freq: Four times a day (QID) | Status: DC
  Administered 2021-09-14 – 2021-09-15 (×4): 200 mL

## 2021-09-14 MED ORDER — METHYLPREDNISOLONE SODIUM SUCC 40 MG IJ SOLR
30.0000 mg | Freq: Every day | INTRAMUSCULAR | Status: DC
Start: 2021-09-14 — End: 2021-09-15
  Administered 2021-09-14 – 2021-09-15 (×2): 30 mg via INTRAVENOUS
  Filled 2021-09-14: qty 1

## 2021-09-14 MED ORDER — DEXMEDETOMIDINE HCL IN NACL 400 MCG/100ML IV SOLN
0.4000 ug/kg/h | INTRAVENOUS | Status: DC
  Administered 2021-09-14: 1 ug/kg/h via INTRAVENOUS
  Administered 2021-09-15 (×3): 1.2 ug/kg/h via INTRAVENOUS
  Administered 2021-09-16: 1 ug/kg/h via INTRAVENOUS
  Administered 2021-09-16: 1.2 ug/kg/h via INTRAVENOUS
  Administered 2021-09-17: 1 ug/kg/h via INTRAVENOUS
  Filled 2021-09-14 (×7): qty 100

## 2021-09-14 MED ORDER — SODIUM CHLORIDE 0.9 % IV SOLN
2.0000 g | INTRAVENOUS | Status: DC
  Administered 2021-09-14 – 2021-09-16 (×3): 2 g via INTRAVENOUS
  Filled 2021-09-14 (×3): qty 2

## 2021-09-14 MED ORDER — QUETIAPINE FUMARATE 25 MG PO TABS
12.5000 mg | ORAL_TABLET | Freq: Two times a day (BID) | ORAL | Status: DC
  Administered 2021-09-14 – 2021-09-16 (×6): 12.5 mg
  Filled 2021-09-14 (×6): qty 1

## 2021-09-14 NOTE — Progress Notes (Addendum)
Pharmacy Antibiotic Note  Donna Curry is a 66 y.o. female with possibe sepsis.  Pharmacy has been consulted for cefepime and vancomycin dosing. -WBC= 16.5, Tmax = 101.8 (since antibiotics initiated), CrCl ~ 25-30    Plan: -Increase Cefepime to 2gm IV q24h -Continue vancomycin 500mg  IV q48h -Will follow renal function, cultures and clinical progress  Height: 5\' 5"  (165.1 cm) Weight: 36.9 kg (81 lb 5.6 oz) IBW/kg (Calculated) : 57  Temp (24hrs), Avg:100.5 F (38.1 C), Min:99.4 F (37.4 C), Max:102.3 F (39.1 C)  Recent Labs  Lab 09/08/21 0753 09/08/21 2147 09/09/21 0321 09/10/21 0223 09/11/21 0245 09/12/21 0124 09/13/21 0204 09/14/21 0218  WBC 6.6  --  11.1* 9.0 9.8  --  11.3* 16.5*  CREATININE 1.28*   < > 1.91* 1.79* 1.48* 1.17* 1.15* 1.07*  LATICACIDVEN 1.0  --   --   --   --   --   --   --    < > = values in this interval not displayed.     Estimated Creatinine Clearance: 30.5 mL/min (A) (by C-G formula based on SCr of 1.07 mg/dL (H)).    No Known Allergies  Antimicrobials this admission: 2/9 Azith>>2/11 2/9 Rocephin >>2/13 2/20 cefepime >> 2/20 vanc>>  Dose adjustments this admission:   Microbiology results: 2/9 MRSA PCR: negative 2/9: Trach asp: negative 2/20: Blood x2: ngtd  Thank you for allowing pharmacy to be a part of this patients care.  Luisa Hart, PharmD, BCPS Clinical Pharmacist 09/14/2021 8:46 AM   Please refer to [facility] for pharmacy phone number

## 2021-09-14 NOTE — Progress Notes (Signed)
°  Transition of Care [facility]) Screening Note   Patient Details  Name: Donna Curry Date of Birth: August 05, 1955   Transition of Care [facility]) CM/SW Contact:    Tom-Johnson, Renea Ee, RN Phone Number: 09/14/2021, 9:49 AM  CM spoke with sister, [name] at bedside. Patient is admitted for Acute Respiratory Failure and currently intubated. On Fentanyl and Precedex. [name] states that patient lives with her and patient's grandson. Patient has a daughter who is having difficulties at this time to make goals of care decisions. Patient has Hx of urothelial Cancer. Has a walker, bsc, wheelchair, and shower seat. PCP is [name], NP and uses [facility] on [address] in [place]. Transition of Care Department [facility]) has reviewed patient and no TOC needs or recommendations have been identified at this time. TOC will continue to monitor patient advancement through interdisciplinary progression rounds. If new patient transition needs arise, please place a TOC consult.

## 2021-09-14 NOTE — Consult Note (Signed)
Referral MD  Reason for Referral: Respiratory failure secondary to COPD exacerbation; metastatic urothelial cancer-in remission  Chief Complaint  Patient presents with   Shortness of Breath  : Patient is intubated  HPI: Donna Curry is well-known to me.  She is a very nice 66 year old Afro-American female.  We have been following her for about 3 years.  She has metastatic urothelial cancer.  She only has had to take immunotherapy.  We have been doing immunotherapy with Keytruda every 3 months.  Her last treatment was back in December.  Pression a PET scan done in early February which did not show any evidence of metastatic disease.  She has significant COPD.  She smoked for a long time.  She has stopped smoking for probably about 5 months.  She was admitted on 09/20/2021 with a COPD exacerbation.  She is on a ventilator.  It appears that she is not can get off the ventilator from the notes that I have seen..  She had a CT angiogram on admission.  There is no pulmonary embolism.  She had worsening of her COPD.  She a lot of bronchial wall thickening.  She had secretions.  Her labs today show white cell count 16.5.  Hemoglobin 7.1.  Platelet count 158,000.  Her SGPT and SGOT are incredibly elevated.  SGPT is 1028.  SGOT 126.  Bilirubin is 0.6.  Alkaline phosphatase is 106.  Her BUN is 50 creatinine 1.07.  She is on antibiotics.  She is on enteral feeding.  I think her brother was in the room this morning.  I just hate that she is likely not going to make it through.  I know that her daughter is having a tough time making a decision as to whether or not to withdraw therapy.  Again, there is no evidence of metastatic urothelial cancer.  She has done incredibly well despite her frailty.  Ever since we started seeing her, she has not weighed more than 80 pounds.  She had normal LFTs just 2 weeks ago.  I know that she is getting wonderful care from all the staff.     Past Medical History:   Diagnosis Date   Anemia    Asthma    Dyspnea    with exertion    Family history of adverse reaction to anesthesia    brother wakes up and dont know who he is and sister has n/v   Goals of care, counseling/discussion 02/09/2018   History of blood transfusion    Hypertension    Iron deficiency anemia due to chronic blood loss 03/01/2018   Measles as a child   Mumps as a child   TIA (transient ischemic attack)    Tobacco abuse   :   Past Surgical History:  Procedure Laterality Date   CYSTOSCOPY W/ URETERAL STENT PLACEMENT Right 12/13/2017   Procedure: CYSTOSCOPY WITH RIGHT RETROGRADE PYELOGRAM/URETERAL RIGHT STENT PLACEMENT;  Surgeon: [name], MD;  Location: [facility];  Service: Urology;  Laterality: Right;   IR IMAGING GUIDED PORT INSERTION  02/23/2018   TRANSURETHRAL RESECTION OF BLADDER TUMOR N/A 12/13/2017   Procedure: TRANSURETHRAL RESECTION OF BLADDER TUMOR (TURBT);  Surgeon: [name], MD;  Location: [facility];  Service: Urology;  Laterality: N/A;   TRANSURETHRAL RESECTION OF BLADDER TUMOR N/A 10/03/2018   Procedure: TRANSURETHRAL RESECTION OF BLADDER TUMOR (TURBT);  Surgeon: [name], MD;  Location: [facility];  Service: Urology;  Laterality: N/A;   TRANSURETHRAL RESECTION OF  BLADDER TUMOR WITH GYRUS (TURBT-GYRUS)     [name] 12-13-17   tubes tied     uterine ablation     about 2005  :   Current Facility-Administered Medications:    0.9 %  sodium chloride infusion, , Intravenous, PRN, Collene Gobble, MD, Stopped at 09/06/21 1200   0.9 %  sodium chloride infusion, 250 mL, Intravenous, Continuous, Jose Persia, MD   acetaminophen (TYLENOL) tablet 650 mg, 650 mg, Per Tube, Q4H PRN, Jacky Kindle, MD, 650 mg at 09/14/21 0000   albuterol (PROVENTIL) (2.5 MG/3ML) 0.083% nebulizer solution 2.5 mg, 2.5 mg, Nebulization, TID, Collene Gobble, MD, 2.5 mg at 09/13/21 1936   albuterol (PROVENTIL) (2.5 MG/3ML) 0.083% nebulizer solution 2.5 mg, 2.5 mg, Nebulization,  Q3H PRN, Anders Simmonds, MD   arformoterol [facility]) nebulizer solution 15 mcg, 15 mcg, Nebulization, BID, Candee Furbish, MD, 15 mcg at 09/13/21 1936   budesonide (PULMICORT) nebulizer solution 0.25 mg, 0.25 mg, Nebulization, BID, Candee Furbish, MD, 0.25 mg at 09/13/21 1934   ceFEPIme (MAXIPIME) 1 g in sodium chloride 0.9 % 100 mL IVPB, 1 g, Intravenous, Q24H, Kris Mouton, [facility], Stopped at 09/13/21 2115   chlorhexidine gluconate (MEDLINE KIT) (PERIDEX) 0.12 % solution 15 mL, 15 mL, Mouth Rinse, BID, Mannam, Praveen, MD, 15 mL at 09/13/21 2050   Chlorhexidine Gluconate Cloth 2 % PADS 6 each, 6 each, Topical, Daily, Candee Furbish, MD, 6 each at 09/13/21 1657   cholecalciferol (VITAMIN D3) tablet 4,000 Units, 4,000 Units, Per Tube, Daily, Jose Persia, MD, 4,000 Units at 09/13/21 8828   citalopram (CELEXA) tablet 20 mg, 20 mg, Per Tube, Daily, Candee Furbish, MD, 20 mg at 09/13/21 0923   dexmedetomidine (PRECEDEX) 400 MCG/100ML (4 mcg/mL) infusion, 0.4-1.2 mcg/kg/hr, Intravenous, Titrated, Chand, Sudham, MD, Last Rate: 9.6 mL/hr at 09/14/21 0500, 1.2 mcg/kg/hr at 09/14/21 0500   dextrose 50 % solution, , , ,    diazepam (VALIUM) tablet 5 mg, 5 mg, Per Tube, Q6H PRN, Candee Furbish, MD, 5 mg at 09/13/21 2207   docusate (COLACE) 50 MG/5ML liquid 100 mg, 100 mg, Per Tube, BID PRN, Candee Furbish, MD, 100 mg at 09/05/21 2135   feeding supplement (VITAL AF 1.2 CAL) liquid 1,000 mL, 1,000 mL, Per Tube, Continuous, Chand, Currie Paris, MD, Last Rate: 45 mL/hr at 09/13/21 1952, 1,000 mL at 09/13/21 1952   fentaNYL (SUBLIMAZE) bolus via infusion 25-100 mcg, 25-100 mcg, Intravenous, Q30 min PRN, Freda Bartolomei B, MD, 100 mcg at 09/14/21 0413   fentaNYL 2552mcg in NS 273mL (23mcg/ml) infusion-PREMIX, 25-400 mcg/hr, Intravenous, Continuous, Chand, Sudham, MD, Last Rate: 25 mL/hr at 09/14/21 0500, 250 mcg/hr at 09/14/21 0500   free water 100 mL, 100 mL, Per Tube, Q6H, Chand, Sudham, MD, 100 mL at  09/14/21 0621   heparin injection 5,000 Units, 5,000 Units, Subcutaneous, Q8H, Collene Gobble, MD, 5,000 Units at 09/14/21 0034   hydrALAZINE (APRESOLINE) injection 10-20 mg, 10-20 mg, Intravenous, Q6H PRN, Collene Gobble, MD, 10 mg at 09/11/21 1211   insulin aspart (novoLOG) injection 0-9 Units, 0-9 Units, Subcutaneous, Q4H, Candee Furbish, MD, 1 Units at 09/14/21 0408   labetalol (NORMODYNE) injection 5 mg, 5 mg, Intravenous, Q2H PRN, Jose Persia, MD, 5 mg at 09/11/21 1104   lactated ringers bolus 1,000 mL, 1,000 mL, Intravenous, Once, Jacky Kindle, MD   loratadine (CLARITIN) tablet 10 mg, 10 mg, Per Tube, Daily, Candee Furbish, MD, 10 mg at 09/13/21 9179   MEDLINE  mouth rinse, 15 mL, Mouth Rinse, 10 times per day, Mannam, Praveen, MD, 15 mL at 09/14/21 5674   methylPREDNISolone sodium succinate (SOLU-MEDROL) 40 mg/mL injection 40 mg, 40 mg, Intravenous, Daily, Byrum, Les Pou, MD, 40 mg at 09/13/21 7631   multivitamin with minerals tablet 1 tablet, 1 tablet, Per Tube, Daily, Leslye Peer, MD, 1 tablet at 09/13/21 0917   ondansetron [facility]) injection 4 mg, 4 mg, Intravenous, Q6H PRN, Lorin Glass, MD   pantoprazole sodium (PROTONIX) 40 mg/20 mL oral suspension 40 mg, 40 mg, Per Tube, Daily, Titus Mould, RPH, 40 mg at 09/13/21 3174   polyethylene glycol (MIRALAX / GLYCOLAX) packet 17 g, 17 g, Per Tube, Daily PRN, Cheri Fowler, MD   revefenacin (YUPELRI) nebulizer solution 175 mcg, 175 mcg, Nebulization, Daily, Lorin Glass, MD, 175 mcg at 09/13/21 1056   sodium chloride flush (NS) 0.9 % injection 10-40 mL, 10-40 mL, Intracatheter, Q12H, Lorin Glass, MD, 10 mL at 09/13/21 2207   sodium chloride flush (NS) 0.9 % injection 10-40 mL, 10-40 mL, Intracatheter, PRN, Lorin Glass, MD   thiamine tablet 100 mg, 100 mg, Per Tube, Daily, Verdene Lennert, MD, 100 mg at 09/13/21 0917   [START ON 09/15/2021] vancomycin (VANCOREADY) IVPB 500 mg/100 mL, 500 mg, Intravenous, Q48H, Silvana Newness, RPH   zinc sulfate capsule 220 mg, 220 mg, Per Tube, BID, Verdene Lennert, MD, 220 mg at 09/13/21 2207:   albuterol  2.5 mg Nebulization TID   arformoterol  15 mcg Nebulization BID   budesonide (PULMICORT) nebulizer solution  0.25 mg Nebulization BID   chlorhexidine gluconate (MEDLINE KIT)  15 mL Mouth Rinse BID   Chlorhexidine Gluconate Cloth  6 each Topical Daily   cholecalciferol  4,000 Units Per Tube Daily   citalopram  20 mg Per Tube Daily   dextrose       free water  100 mL Per Tube Q6H   heparin injection (subcutaneous)  5,000 Units Subcutaneous Q8H   insulin aspart  0-9 Units Subcutaneous Q4H   loratadine  10 mg Per Tube Daily   mouth rinse  15 mL Mouth Rinse 10 times per day   methylPREDNISolone (SOLU-MEDROL) injection  40 mg Intravenous Daily   multivitamin with minerals  1 tablet Per Tube Daily   pantoprazole sodium  40 mg Per Tube Daily   revefenacin  175 mcg Nebulization Daily   sodium chloride flush  10-40 mL Intracatheter Q12H   thiamine  100 mg Per Tube Daily   zinc sulfate  220 mg Per Tube BID  :  No Known Allergies:   Family History  Problem Relation Age of Onset   Hypertension Mother    Diabetes Father    Hypertension Brother    HIV Brother   :   Social History   Socioeconomic History   Marital status: Single    Spouse name: Not on file   Number of children: Not on file   Years of education: Not on file   Highest education level: Not on file  Occupational History   Not on file  Tobacco Use   Smoking status: Every Day    Packs/day: 0.25    Types: Cigarettes   Smokeless tobacco: Never   Tobacco comments:    smoking 3 cigarettes a day  Vaping Use   Vaping Use: Former  Substance and Sexual Activity   Alcohol use: No    Alcohol/week: 0.0 standard drinks   Drug use: Never  Sexual activity: Not Currently  Other Topics Concern   Not on file  Social History Narrative   Denies hx of drug useSingle1 daughter age 45 lives with daughter  and grandson who is 78.Works as a Sports coach for [employer].Completed 12th grade.      01/22/21 states she and her sister live together.    Social Determinants of Health   Financial Resource Strain: Not on file  Food Insecurity: Not on file  Transportation Needs: Not on file  Physical Activity: Not on file  Stress: Not on file  Social Connections: Not on file  Intimate Partner Violence: Not on file  :  Pertinent items are noted in HPI.  Exam: Patient Vitals for the past 24 hrs:  BP Temp Temp src Pulse Resp SpO2  09/14/21 0500 (!) 87/54 -- -- 86 14 95 %  09/14/21 0400 120/72 -- -- 98 (!) 21 93 %  09/14/21 0339 -- 99.9 F (37.7 C) Oral -- -- --  09/14/21 0334 116/74 -- -- (!) 104 (!) 23 94 %  09/14/21 0300 (!) 75/53 -- -- 81 18 95 %  09/14/21 0200 112/65 -- -- 93 18 94 %  09/14/21 0130 129/81 -- -- 100 17 95 %  09/14/21 0115 126/85 -- -- 96 17 97 %  09/14/21 0100 -- -- -- 80 17 95 %  09/14/21 0045 (!) 83/52 -- -- 81 (!) 22 94 %  09/14/21 0030 (!) 84/58 -- -- 84 (!) 26 93 %  09/14/21 0015 101/63 -- -- 92 18 92 %  09/14/21 0000 138/73 (!) 101 F (38.3 C) Axillary 97 12 94 %  09/13/21 2345 (!) 79/56 -- -- 88 20 92 %  09/13/21 2330 (!) 79/53 -- -- 91 (!) 22 92 %  09/13/21 2320 94/66 -- -- 95 18 96 %  09/13/21 2315 94/66 -- -- 91 18 96 %  09/13/21 2300 (!) 65/47 -- -- 95 (!) 7 97 %  09/13/21 2245 101/64 -- -- (!) 103 16 95 %  09/13/21 2230 (!) 77/61 -- -- (!) 106 17 95 %  09/13/21 2215 (!) 172/96 -- -- (!) 125 15 94 %  09/13/21 2200 (!) 149/79 -- -- (!) 108 18 92 %  09/13/21 2145 (!) 180/83 -- -- (!) 107 14 96 %  09/13/21 2130 137/77 -- -- 98 18 94 %  09/13/21 2115 (!) 164/86 -- -- (!) 101 19 96 %  09/13/21 2100 136/78 -- -- 95 18 97 %  09/13/21 2045 128/71 -- -- 88 15 98 %  09/13/21 2030 98/75 -- -- 85 18 98 %  09/13/21 2015 125/73 -- -- 86 18 98 %  09/13/21 2000 125/81 -- -- 87 16 97 %  09/13/21 1948 -- 99.9 F (37.7 C) Oral -- -- --  09/13/21 1945 119/80 --  -- 81 18 98 %  09/13/21 1934 -- -- -- -- -- 99 %  09/13/21 1930 92/62 -- -- 78 18 99 %  09/13/21 1915 (!) 87/58 -- -- 79 18 99 %  09/13/21 1900 95/63 -- -- 82 18 98 %  09/13/21 1800 108/71 -- -- (!) 102 17 96 %  09/13/21 1715 110/67 -- -- 98 18 92 %  09/13/21 1654 -- 99.4 F (37.4 C) Oral -- -- --  09/13/21 1600 94/64 100.1 F (37.8 C) Oral 90 18 90 %  09/13/21 1535 -- -- -- -- -- 96 %  09/13/21 1534 91/62 -- -- (!) 116 (!) 23 96 %  09/13/21  1413 (!) 154/90 -- -- (!) 138 (!) 24 95 %  09/13/21 1300 (!) 143/98 -- -- (!) 139 (!) 26 95 %  09/13/21 1228 -- (!) 101.8 F (38.8 C) Oral -- -- --  09/13/21 1215 132/74 -- -- (!) 118 17 90 %  09/13/21 1200 (!) 169/87 -- -- (!) 132 17 91 %  09/13/21 1100 (!) 153/87 -- -- (!) 129 17 96 %  09/13/21 1058 -- -- -- -- -- 96 %  09/13/21 1057 133/82 -- -- (!) 124 (!) 22 91 %  09/13/21 1000 132/85 -- -- (!) 125 18 90 %  09/13/21 0900 94/62 (!) 102.3 F (39.1 C) Oral (!) 108 18 91 %  09/13/21 0800 (!) 81/57 -- -- 98 18 94 %  09/13/21 0732 -- -- -- -- -- 96 %  09/13/21 0730 -- -- -- -- -- 96 %  09/13/21 0725 -- -- -- -- -- 96 %  09/13/21 0724 (!) 88/61 -- -- (!) 112 19 95 %  09/13/21 0700 101/65 -- -- (!) 101 18 91 %  09/13/21 0645 99/67 -- -- (!) 103 19 90 %  09/13/21 0630 101/71 -- -- 100 18 90 %   Physical Exam Vitals reviewed.  Constitutional:      Comments: She is intubated.  She is sedated.  There is no tachypnea.  HENT:     Head: Normocephalic and atraumatic.  Eyes:     Pupils: Pupils are equal, round, and reactive to light.  Cardiovascular:     Rate and Rhythm: Normal rate and regular rhythm.     Heart sounds: Normal heart sounds.  Pulmonary:     Effort: Pulmonary effort is normal.     Breath sounds: Normal breath sounds.  Abdominal:     General: Bowel sounds are normal.     Palpations: Abdomen is soft.  Musculoskeletal:        General: No tenderness or deformity. Normal range of motion.     Cervical back: Normal range of  motion.  Lymphadenopathy:     Cervical: No cervical adenopathy.  Skin:    General: Skin is warm and dry.     Findings: No erythema or rash.  Neurological:     Mental Status: She is alert and oriented to person, place, and time.  Psychiatric:        Behavior: Behavior normal.        Thought Content: Thought content normal.        Judgment: Judgment normal.      Recent Labs    09/13/21 0204 09/13/21 1109 09/14/21 0218  WBC 11.3*  --  16.5*  HGB 8.6* 8.8* 7.1*  HCT 27.1* 26.0* 23.5*  PLT 170  --  158    Recent Labs    09/13/21 0204 09/13/21 1109 09/14/21 0218  NA 146* 145 147*  K 4.0 3.9 4.3  CL 103  --  105  CO2 34*  --  34*  GLUCOSE 108*  --  139*  BUN 53*  --  50*  CREATININE 1.15*  --  1.07*  CALCIUM 8.5*  --  8.5*    Blood smear review: None  Pathology: None    Assessment and Plan: Ms. Mckeel is a very charming 66 year old African-American female.  She has metastatic bladder cancer.  However she is in remission.  Last PET scan was done just 3 weeks ago.  Unfortunately, she now has a another COPD exacerbation that might be terminal.  At this point, any  decision made with respect to her end-of-life care should not be based upon her bladder cancer.  Again she is in remission.  I suspect that she will stay in remission for quite a while given that immunotherapy tends to have a long-term effect.  She is quite anemic today.  I am unsure if she will be transfused.  I know that the ICU team will decide this.  I spoke to her brother.  I explained to him my take always going on.  I again said that her bladder cancer has been successfully treated.  She was only getting treatment every 3 months.  Again, with immunotherapy, you typically see long-term results with therapy.  If there is anything we can do to help out, we certainly will be able to jump in.  I have always enjoyed seeing Ms. Olafson.  She was always incredibly funny.  She did everything we asked her to  do.  She stopped smoking.  She was so kind to our staff into the other patients and we had whenever she came in for treatment.  Lattie Haw, MD  2 Timothy 4:16-18

## 2021-09-14 NOTE — Progress Notes (Signed)
Interval Progress Note:   Discussed with patient's daughter at bedside the implications of long term trach with ventilator requirement, including requiring for tube feeds and inability to communicate by speaking. She asked me to give her some time to think. After I revisited, Donna Curry stated she would like to transition to comfort with terminal extubation on Friday, February 24th. Until then, she would like for [place] to continue all efforts but patient to remain DNR.   Signed,  Dr. Jose Persia Internal Medicine PGY-3  09/14/2021, 2:34 PM

## 2021-09-14 NOTE — Progress Notes (Signed)
Daily Progress Note   Patient Name: Donna Curry       Date: 09/14/2021 DOB: 04/22/1956  Age: 66 y.o. MRN#: [id number] Attending Physician: Jacky Kindle, MD Primary Care Physician: [name], NP Admit Date: 09/16/2021  Reason for Consultation/Follow-up: Establishing goals of care  Patient Profile/HPI: 66 y.o. female  with past medical history of COPD, bladder cancer on keytruda admitted on 09/14/2021 with acute on chronic respiratory failure in the setting of severe COPD exacerbation. CTA with concerns for multifocal pneumonia. She continues to be intubated, not tolerating weaning trials, not a candidate for trach. PCCM has initiated goals of care discussions and offered transition to comfort. Palliative medicine consulted for continued GOC.     Subjective: Chart reviewed including labs, notes, and imaging.  Noted that Donna Curry has expressed wishes to extubated to comfort on Friday Feb 24th.  Donna Curry expressed that she is just trying to remain calm and hold on. Emotional support given. I called Donna Curry's sister, Donna Curry and answered their questions about patient's status and goals of care, and plan of care. They are supportive of any decisions that Donna Curry makes. They are also grateful for the care that their sister has received and for the communication from team members.    Review of Systems  Unable to perform ROS: Intubated    Physical Exam Vitals and nursing note reviewed.  Constitutional:      Comments: Frail, cachectic            Vital Signs: BP (!) 170/80    Pulse (!) 116    Temp 98.8 F (37.1 C) (Oral)    Resp 16    Ht 5\' 5"  (1.651 m)    Wt 36.9 kg    SpO2 95%    BMI 13.54 kg/m  SpO2: SpO2: 95 % O2 Device: O2 Device: Ventilator O2 Flow Rate:    Intake/output  summary:  Intake/Output Summary (Last 24 hours) at 09/14/2021 1527 Last data filed at 09/14/2021 1345 Gross per 24 hour  Intake 1895.12 ml  Output 1630 ml  Net 265.12 ml   LBM: Last BM Date : 09/14/21 Baseline Weight: Weight: 33.7 kg Most recent weight: Weight: 36.9 kg       Palliative Assessment/Data: PPS: 10%      Patient Active Problem List   Diagnosis Date Noted  Pressure injury of skin 09/14/2021   Acute respiratory failure with hypoxia (HCC) 09/09/2021   Vitamin D deficiency 07/30/2021   Depression with anxiety 07/30/2021   Vaccine counseling 01/15/2021   COPD exacerbation ([place])    Hypoxia 09/22/2020   Protein-calorie malnutrition, severe 09/03/2018   HCAP (healthcare-associated pneumonia) 08/06/2018   Emphysema of lung ([place]) 08/06/2018   Acute on chronic respiratory failure with hypoxia ([place]) 08/06/2018   Iron deficiency anemia due to chronic blood loss 03/01/2018   Goals of care, counseling/discussion 02/09/2018   Bladder cancer ([place]) 12/13/2017   Hypertensive cardiomyopathy, 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[place]) 04/29/2016   Hypokalemia 04/02/2016   Protein-energy malnutrition ([place]) 04/02/2016   Hypertensive emergency 04/01/2016   Dizziness and giddiness 04/12/2013   Palpitations 03/27/2013   Weight loss 06/08/2012   Degenerative disc disease, lumbar 01/30/2012   Hypercalcemia 01/30/2012   Hyperproteinemia 01/30/2012   Borderline diabetes 01/30/2012   Normocytic anemia 03/11/2011   TOBACCO ABUSE 08/31/2009   Essential hypertension 08/31/2009    Palliative Care Assessment & Plan    Assessment/Recommendations/Plan  COPD exacerbation with vent dependence, septic shock- continue current plan of care with eventual transition to comfort measures only on Friday February 24 PMT will continue to follow and provide support   Code Status: DNR  Prognosis:  Hours - Days  Discharge Planning: Anticipated Hospital Death  Care plan was discussed with patient's  daughter and sisters.   Thank you for allowing the Palliative Medicine Team to assist in the care of this patient.   Mariana Kaufman, AGNP-C Palliative Medicine   Please contact Palliative Medicine Team phone at [phone] for questions and concerns.

## 2021-09-14 NOTE — Progress Notes (Addendum)
NAME:  Donna Curry, MRN:  [id number], DOB:  Dec 26, 1955, LOS: 8 ADMISSION DATE:  08/31/2021, CONSULTATION DATE:  09/12/2021  REFERRING MD:  Dr. Dina Rich, CHIEF COMPLAINT:  COPD exacerbation    History of Present Illness:  Donna Curry is a 66 year old female with a past medical history of chronic hypoxic respiratory failure 2/2 COPD on home supplemental oxygen (2L), bladder malignancy currently on Keytruda, iron deficiency anemia who presented to the ED with complaints of shortness of breath.  History limited 2/2 emergent intubation and sedation.  Per chart review, patient has been experiencing increasing shortness of breath for several days now.  She has been compliant with her home supplemental oxygen and nebulizer treatments.  On EMS arrival, she was hypoxic at 66% on home oxygen, with tachypnea and tripoding.  Patient was placed on CPAP with improvement in oxygenation up to 95%.  She was subsequently placed on BiPAP however due to worsening work of breathing, intubation was pursued.  Patient has received breathing treatments and high dose steroids.   Pertinent  Medical History   Past Medical History:  Diagnosis Date   Anemia    Asthma    Dyspnea    with exertion    Family history of adverse reaction to anesthesia    brother wakes up and dont know who he is and sister has n/v   Goals of care, counseling/discussion 02/09/2018   History of blood transfusion    Hypertension    Iron deficiency anemia due to chronic blood loss 03/01/2018   Measles as a child   Mumps as a child   TIA (transient ischemic attack)    Tobacco abuse    Significant Hospital Events: Including procedures, antibiotic start and stop dates in addition to other pertinent events   09/19/2021: Presented to the ED. Intubated. Transferred to ICU 09/07/2021: Patient repeatedly fails SBTs. Discussion with family that extubation is unlikely. Family considering comfort care versus trach.  2/16: Acute decompensation in the  early hours with severe acidemia, hypotension 2/20: Vanc and Cefepime started for sepsis.  Interim History / Subjective:   Overnight, patient had elevated temperature of 100.0 but no fever. Increasing WBC up to 16.5. Vancomycin and Cefepime started yesterday, in addition to obtaining tracheal and blood cultures; results pending. Intermittent hypotension overnight with self-resolution. No longer requiring Levophed.   This AM, patient is awake and nods her head to questions intermittently.   Objective   Blood pressure 123/77, pulse (!) 103, temperature 99.5 F (37.5 C), temperature source Oral, resp. rate (!) 23, height 5\' 5"  (1.651 m), weight 36.9 kg, SpO2 96 %.    Vent Mode: PRVC FiO2 (%):  [40 %] 40 % Set Rate:  [18 bmp] 18 bmp Vt Set:  [500 mL] 500 mL PEEP:  [8 cmH20] 8 cmH20 Pressure Support:  [8 cmH20] 8 cmH20 Plateau Pressure:  [11 cmH20-19 cmH20] 15 cmH20   Intake/Output Summary (Last 24 hours) at 09/14/2021 0819 Last data filed at 09/14/2021 0800 Gross per 24 hour  Intake 2485.04 ml  Output 2110 ml  Net 375.04 ml    Filed Weights   09/12/21 0500 09/13/21 0500 09/14/21 0500  Weight: 40 kg 32 kg 36.9 kg   Examination: General: Cachectic, ill-appearing woman ventilated.  HENT: ET tube in good position. Moist mucus membranes.  Lungs: Very distant breathe sounds with some rhonchi bilaterally. No wheezing.  Cardiovascular: Distant, regular rhythm with tachycardia, no murmur Abdomen: Non-distended, positive bowel sounds Extremities: Poor muscle mass, no edema Neuro: She  will open eyes to voice, answers questions intermittently  GU: Deferred  Resolved Hospital Problem list   N/A  Assessment & Plan:   # Sepsis New onset high fevers on 2/20 with worsening leukocytosis. At risk for hospital acquired infections. No increase in ventilatory requirements however new rhonchi on examination.   - CXR ordered - Blood and tracheal aspirates pending - UA ordered - Continue  Vancomycin and Cefepime   # Acute on Chronic Hypoxic and Hypercapnic Respiratory Failure # COPD Exacerbation  # Multi-focal CAP: Resolved Completed 3 day course of Azithromycin (2/9 - 2/11) and 5 day course of Ceftriaxone (2/9 - 2-13).  Repeat respiratory cultures, AFB negative.  -Continue Solu-Medrol. Decreased to 30 mg IV daily today -Continue full vent support. Decrease PEEP to 5 today  -Daily SBT as tolerated although she has not tolerated well so far -Scheduled bronchodilators as ordered -PAD protocol: Precedex, fentanyl infusions  # Hypotension # History of Hypertension  Hypotension concerning for new infection. Please see above.   - Hold Amlodipine   # Acute Kidney Injury # Hyperkalemia: Resolved  - Creatinine continues to improve  - Foley catheter in place - Follow urine output, BMP - Address electrolytes as indicated  # Acute on Chronic Normocytic Anemia # History of IDA - Iron panel consistent with anemia of chronic disease  - Hemoglobin decreased to 7.1 today, however no signs of bleeding on examination.  - CBC daily   # Hypernatremia -Free water as ordered  # Malignant Neoplasm of the Bladder  -Has been treated with Keytruda every 2 months  # Depression -On home Celexa.  Has diazepam available as needed  Best Practice (right click and "Reselect all SmartList Selections" daily)  Diet/type: NPO DVT prophylaxis: Heparin GI prophylaxis: PPI Lines: N/A Foley:  N/A Code Status:  DNR Last date of multidisciplinary goals of care discussion [09/13/2021]  # Goals of Care Despite treatment, patient has persistently failed SBT trials due to tachycardia, severe hypertension and agitation; occasionally experiences hypoxia as well.  Unfortunately, very low likelihood of successful extubation. Both PCCM and palliative have expressed this to patient' daughter. She has really struggled with decision regarding comfort measures versus trach. Discussed that patient would  likely be vent dependent for the rest of her life, so if trach is pursued, she will be bed bound and tube fed. There is a possibility that the closest [facility] [place]. Daughter and aunt have expressed they may potentially still pursue trach.   - Palliative following; appreciate their assistance with these difficult conversations  Labs   CBC: Recent Labs  Lab 09/08/21 0753 09/08/21 0913 09/09/21 0321 09/10/21 0223 09/11/21 0245 09/13/21 0204 09/13/21 1109 09/14/21 0218  WBC 6.6  --  11.1* 9.0 9.8 11.3*  --  16.5*  NEUTROABS 4.8  --  9.6* 7.9* 7.8*  --   --  15.5*  HGB 8.7*   < > 8.6* 9.0* 8.9* 8.6* 8.8* 7.1*  HCT 27.9*   < > 27.2* 27.9* 26.7* 27.1* 26.0* 23.5*  MCV 99.3  --  96.8 93.9 91.8 98.2  --  99.6  PLT 211  --  175 210 157 170  --  158   < > = values in this interval not displayed.    Basic Metabolic Panel: Recent Labs  Lab 09/10/21 0223 09/11/21 0245 09/12/21 0124 09/13/21 0204 09/13/21 1109 09/14/21 0218  NA 145 143 145 146* 145 147*  K 4.0 4.5 4.0 4.0 3.9 4.3  CL 89* 92* 99  103  --  105  CO2 44* 40* 36* 34*  --  34*  GLUCOSE 114* 108* 137* 108*  --  139*  BUN 68* 70* 60* 53*  --  50*  CREATININE 1.79* 1.48* 1.17* 1.15*  --  1.07*  CALCIUM 8.2* 7.8* 8.5* 8.5*  --  8.5*    GFR: Estimated Creatinine Clearance: 30.5 mL/min (A) (by C-G formula based on SCr of 1.07 mg/dL (H)). Recent Labs  Lab 09/08/21 0753 09/09/21 0321 09/10/21 0223 09/11/21 0245 09/13/21 0204 09/14/21 0218  WBC 6.6   < > 9.0 9.8 11.3* 16.5*  LATICACIDVEN 1.0  --   --   --   --   --    < > = values in this interval not displayed.     Liver Function Tests: Recent Labs  Lab 09/14/21 0218  AST 126*  ALT 1,028*  ALKPHOS 106  BILITOT 0.6  PROT 5.4*  ALBUMIN 2.3*    No results for input(s): LIPASE, AMYLASE in the last 168 hours. No results for input(s): AMMONIA in the last 168 hours.  ABG    Component Value Date/Time   PHART 7.383 09/13/2021 1109   PCO2ART 64.5 (H)  09/13/2021 1109   PO2ART 73 (L) 09/13/2021 1109   HCO3 37.7 (H) 09/13/2021 1109   TCO2 39 (H) 09/13/2021 1109   O2SAT 91 09/13/2021 1109     Coagulation Profile: No results for input(s): INR, PROTIME in the last 168 hours.  Cardiac Enzymes: No results for input(s): CKTOTAL, CKMB, CKMBINDEX, TROPONINI in the last 168 hours.  HbA1C: Hgb A1c MFr Bld  Date/Time Value Ref Range Status  09/01/2021 06:32 PM 5.3 4.8 - 5.6 % Final    Comment:    (NOTE) Pre diabetes:          5.7%-6.4%  Diabetes:              >6.4%  Glycemic control for   <7.0% adults with diabetes   12/04/2017 10:40 AM 4.5 (L) 4.6 - 6.5 % Final    Comment:    Glycemic Control Guidelines for People with Diabetes:Non Diabetic:  <6%Goal of Therapy: <7%Additional Action Suggested:  >8%     CBG: Recent Labs  Lab 09/13/21 1946 09/13/21 2115 09/13/21 2354 09/14/21 0337 09/14/21 0716  GLUCAP 60* 180* 117* 124* 132*    Dr. Jose Persia Internal Medicine PGY-3  09/14/2021, 8:19 AM

## 2021-09-15 DIAGNOSIS — J9601 Acute respiratory failure with hypoxia: Secondary | ICD-10-CM | POA: Diagnosis not present

## 2021-09-15 DIAGNOSIS — N170 Acute kidney failure with tubular necrosis: Secondary | ICD-10-CM | POA: Diagnosis not present

## 2021-09-15 DIAGNOSIS — J441 Chronic obstructive pulmonary disease with (acute) exacerbation: Secondary | ICD-10-CM | POA: Diagnosis not present

## 2021-09-15 LAB — CBC WITH DIFFERENTIAL/PLATELET
Abs Immature Granulocytes: 0.08 10*3/uL — ABNORMAL HIGH (ref 0.00–0.07)
Basophils Absolute: 0 10*3/uL (ref 0.0–0.1)
Basophils Relative: 0 %
Eosinophils Absolute: 0 10*3/uL (ref 0.0–0.5)
Eosinophils Relative: 0 %
HCT: 21.7 % — ABNORMAL LOW (ref 36.0–46.0)
Hemoglobin: 6.9 g/dL — CL (ref 12.0–15.0)
Immature Granulocytes: 1 %
Lymphocytes Relative: 4 %
Lymphs Abs: 0.6 10*3/uL — ABNORMAL LOW (ref 0.7–4.0)
MCH: 31.4 pg (ref 26.0–34.0)
MCHC: 31.8 g/dL (ref 30.0–36.0)
MCV: 98.6 fL (ref 80.0–100.0)
Monocytes Absolute: 0.9 10*3/uL (ref 0.1–1.0)
Monocytes Relative: 7 %
Neutro Abs: 12.2 10*3/uL — ABNORMAL HIGH (ref 1.7–7.7)
Neutrophils Relative %: 88 %
Platelets: 141 10*3/uL — ABNORMAL LOW (ref 150–400)
RBC: 2.2 MIL/uL — ABNORMAL LOW (ref 3.87–5.11)
RDW: 18.8 % — ABNORMAL HIGH (ref 11.5–15.5)
WBC: 13.7 10*3/uL — ABNORMAL HIGH (ref 4.0–10.5)
nRBC: 0 % (ref 0.0–0.2)

## 2021-09-15 LAB — COMPREHENSIVE METABOLIC PANEL
ALT: 663 U/L — ABNORMAL HIGH (ref 0–44)
AST: 54 U/L — ABNORMAL HIGH (ref 15–41)
Albumin: 2.2 g/dL — ABNORMAL LOW (ref 3.5–5.0)
Alkaline Phosphatase: 92 U/L (ref 38–126)
Anion gap: 10 (ref 5–15)
BUN: 49 mg/dL — ABNORMAL HIGH (ref 8–23)
CO2: 31 mmol/L (ref 22–32)
Calcium: 8.6 mg/dL — ABNORMAL LOW (ref 8.9–10.3)
Chloride: 107 mmol/L (ref 98–111)
Creatinine, Ser: 0.96 mg/dL (ref 0.44–1.00)
GFR, Estimated: >60 mL/min (ref >60)
Glucose, Bld: 119 mg/dL — ABNORMAL HIGH (ref 70–99)
Potassium: 4.1 mmol/L (ref 3.5–5.1)
Sodium: 148 mmol/L — ABNORMAL HIGH (ref 135–145)
Total Bilirubin: 0.2 mg/dL — ABNORMAL LOW (ref 0.3–1.2)
Total Protein: 5.3 g/dL — ABNORMAL LOW (ref 6.5–8.1)

## 2021-09-15 LAB — GLUCOSE, CAPILLARY
Glucose-Capillary: 114 mg/dL — ABNORMAL HIGH (ref 70–99)
Glucose-Capillary: 121 mg/dL — ABNORMAL HIGH (ref 70–99)
Glucose-Capillary: 125 mg/dL — ABNORMAL HIGH (ref 70–99)
Glucose-Capillary: 136 mg/dL — ABNORMAL HIGH (ref 70–99)
Glucose-Capillary: 140 mg/dL — ABNORMAL HIGH (ref 70–99)
Glucose-Capillary: 156 mg/dL — ABNORMAL HIGH (ref 70–99)
Glucose-Capillary: 193 mg/dL — ABNORMAL HIGH (ref 70–99)

## 2021-09-15 LAB — PREPARE RBC (CROSSMATCH)

## 2021-09-15 MED ORDER — METHYLPREDNISOLONE SODIUM SUCC 40 MG IJ SOLR
20.0000 mg | Freq: Every day | INTRAMUSCULAR | Status: DC
  Administered 2021-09-16: 20 mg via INTRAVENOUS
  Filled 2021-09-15: qty 1

## 2021-09-15 MED ORDER — FREE WATER
200.0000 mL | Status: DC
  Administered 2021-09-15 – 2021-09-17 (×11): 200 mL

## 2021-09-15 MED ORDER — SODIUM CHLORIDE 0.9% IV SOLUTION: Freq: Once | INTRAVENOUS | Status: DC

## 2021-09-15 NOTE — Progress Notes (Signed)
Daily Progress Note   Patient Name: Donna Curry       Date: 09/15/2021 DOB: Dec 23, 1955  Age: 66 y.o. MRN#: [id number] Attending Physician: Jacky Kindle, MD Primary Care Physician: [name], NP Admit Date: 09/05/2021  Reason for Consultation/Follow-up: Establishing goals of care  Patient Profile/HPI: 66 y.o. female  with past medical history of COPD, bladder cancer on keytruda admitted on 08/25/2021 with acute on chronic respiratory failure in the setting of severe COPD exacerbation. CTA with concerns for multifocal pneumonia. She continues to be intubated, not tolerating weaning trials, not a candidate for trach. PCCM has initiated goals of care discussions and offered transition to comfort. Palliative medicine consulted for continued GOC.     Subjective: Chart reviewed including labs, notes, and imaging.  [name] and [name] are at bedside.  [name] continues to endorse plan for extubation to comfort on Friday. She has no questions or needs at this time.   Review of Systems  Unable to perform ROS: Intubated    Physical Exam Vitals and nursing note reviewed.  Constitutional:      Comments: Frail, cachectic            Vital Signs: BP 112/67    Pulse (!) 101    Temp 98.5 F (36.9 C) (Oral)    Resp 18    Ht 5\' 5"  (1.651 m)    Wt 31.9 kg    SpO2 98%    BMI 11.70 kg/m  SpO2: SpO2: 98 % O2 Device: O2 Device: Ventilator O2 Flow Rate:    Intake/output summary:  Intake/Output Summary (Last 24 hours) at 09/15/2021 1512 Last data filed at 09/15/2021 1400 Gross per 24 hour  Intake 1978.02 ml  Output 1425 ml  Net 553.02 ml    LBM: Last BM Date : 09/15/21 Baseline Weight: Weight: 33.7 kg Most recent weight: Weight: 31.9 kg       Palliative Assessment/Data: PPS:  10%      Patient Active Problem List   Diagnosis Date Noted   Pressure injury of skin 09/14/2021   Acute respiratory failure with hypoxia ([place]) 09/01/2021   Vitamin D deficiency 07/30/2021   Depression with anxiety 07/30/2021   Vaccine counseling 01/15/2021   COPD exacerbation ([place])    Hypoxia 09/22/2020   Protein-calorie malnutrition, severe 09/03/2018   HCAP (healthcare-associated pneumonia) 08/06/2018  Emphysema of lung ([place]) 08/06/2018   Acute on chronic respiratory failure with hypoxia ([facility]) 08/06/2018   Iron deficiency anemia due to chronic blood loss 03/01/2018   Goals of care, counseling/discussion 02/09/2018   Bladder cancer ([place]) 12/13/2017   Hypertensive cardiomyopathy, 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[place]) 04/29/2016   Hypokalemia 04/02/2016   Protein-energy malnutrition ([place]) 04/02/2016   Hypertensive emergency 04/01/2016   Dizziness and giddiness 04/12/2013   Palpitations 03/27/2013   Weight loss 06/08/2012   Degenerative disc disease, lumbar 01/30/2012   Hypercalcemia 01/30/2012   Hyperproteinemia 01/30/2012   Borderline diabetes 01/30/2012   Normocytic anemia 03/11/2011   TOBACCO ABUSE 08/31/2009   Essential hypertension 08/31/2009    Palliative Care Assessment & Plan    Assessment/Recommendations/Plan  COPD exacerbation with vent dependence, septic shock- continue current plan of care with eventual transition to comfort measures only on Friday February 24 PMT will continue to follow and provide support   Code Status: DNR  Prognosis:  Hours - Days  Discharge Planning: Anticipated Hospital Death  Care plan was discussed with patient's daughter and sisters.   Thank you for allowing the Palliative Medicine Team to assist in the care of this patient.   Mariana Kaufman, AGNP-C Palliative Medicine   Please contact Palliative Medicine Team phone at [phone] for questions and concerns.

## 2021-09-15 NOTE — Progress Notes (Signed)
NAME:  Donna Curry, MRN:  [id number], DOB:  Jan 28, 1956, LOS: 45 ADMISSION DATE:  09/08/2021, CONSULTATION DATE:  09/03/2021  REFERRING MD:  Dr. Dina Rich, CHIEF COMPLAINT:  COPD exacerbation    History of Present Illness:  [name] is a 66 year old female with a past medical history of chronic hypoxic respiratory failure 2/2 COPD on home supplemental oxygen (2L), bladder malignancy currently on Keytruda, iron deficiency anemia who presented to the ED with complaints of shortness of breath.  History limited 2/2 emergent intubation and sedation.  Per chart review, patient has been experiencing increasing shortness of breath for several days now.  She has been compliant with her home supplemental oxygen and nebulizer treatments.  On EMS arrival, she was hypoxic at 66% on home oxygen, with tachypnea and tripoding.  Patient was placed on CPAP with improvement in oxygenation up to 95%.  She was subsequently placed on BiPAP however due to worsening work of breathing, intubation was pursued.  Patient has received breathing treatments and high dose steroids.   Pertinent  Medical History   Past Medical History:  Diagnosis Date   Anemia    Asthma    Dyspnea    with exertion    Family history of adverse reaction to anesthesia    brother wakes up and dont know who he is and sister has n/v   Goals of care, counseling/discussion 02/09/2018   History of blood transfusion    Hypertension    Iron deficiency anemia due to chronic blood loss 03/01/2018   Measles as a child   Mumps as a child   TIA (transient ischemic attack)    Tobacco abuse    Significant Hospital Events: Including procedures, antibiotic start and stop dates in addition to other pertinent events   09/01/2021: Presented to the ED. Intubated. Transferred to ICU 09/07/2021: Patient repeatedly fails SBTs. Discussion with family that extubation is unlikely. Family considering comfort care versus trach.  2/16: Acute decompensation in the  early hours with severe acidemia, hypotension 2/20: Vanc and Cefepime started for sepsis. 2/21: Plan for comfort measures on 2/24  Interim History / Subjective:   No additional fevers overnight. Hypotension noted that self-resolved. Remains on Fentanyl and Precedex. Hemoglobin decreased and pRBC ordered, however daughter was unable to be reached.   This AM, patient is maximally sedated.  No acute distress noted.   Objective   Blood pressure (!) 94/53, pulse 91, temperature 99.9 F (37.7 C), temperature source Oral, resp. rate 20, height 5\' 5"  (1.651 m), weight 31.9 kg, SpO2 98 %.    Vent Mode: PRVC FiO2 (%):  [40 %-50 %] 40 % Set Rate:  [18 bmp] 18 bmp Vt Set:  [500 mL] 500 mL PEEP:  [8 cmH20] 8 cmH20 Plateau Pressure:  [10 cmH20-19 cmH20] 16 cmH20   Intake/Output Summary (Last 24 hours) at 09/15/2021 0809 Last data filed at 09/15/2021 0602 Gross per 24 hour  Intake 1670.4 ml  Output 1280 ml  Net 390.4 ml    Filed Weights   09/13/21 0500 09/14/21 0500 09/15/21 0500  Weight: 32 kg 36.9 kg 31.9 kg   Examination: General: Cachectic, ill-appearing woman ventilated.  HENT: ET tube in good position. Moist mucus membranes.  Lungs: Very distant breathe sounds. Overall clear without wheezing.  Cardiovascular: Distant, regular rhythm with tachycardia, no murmur Abdomen: Non-distended, positive bowel sounds Extremities: Poor muscle mass, no edema Neuro: Sedated.  GU: Deferred  Resolved Hospital Problem list   N/A  Assessment & Plan:   #  Sepsis Source of infection un-identified at this time although high risk for hospital acquired infection. UA inconsistent with UTI (negative leukocytes, nitrates)  - Blood cultures pending. NGTD x 2 days  - Tracheal aspirates pending  - Continue Cefepime   # Acute on Chronic Hypoxic and Hypercapnic Respiratory Failure # COPD Exacerbation  # Multi-focal CAP: Resolved Completed 3 day course of Azithromycin (2/9 - 2/11) and 5 day course of  Ceftriaxone (2/9 - 2-13). Cultures negative.   -Continue Solu-Medrol. Decrease to 20 mg IV daily tomorrow -Continue full vent support  -Daily SBT as tolerated -Scheduled bronchodilators as ordered -PAD protocol: Precedex, fentanyl infusions -Given goals of care discussions, plan for terminal extubation on Friday, February 24th  # Hypotension # History of Hypertension  - Hold Amlodipine   # Acute Kidney Injury # Hyperkalemia: Resolved  - Creatinine continues to improve today although BUN remains elevated in the setting of high dose Solumedrol  - Foley catheter in place - Follow urine output, BMP - Monitor electrolytes and replenish PRN   # Acute on Chronic Normocytic Anemia # History of IDA - Iron panel consistent with anemia of chronic disease  - Hemoglobin continues to slowly decline, now down to 6.9. No acute bleeding on examination. Will hold off on transfusion.  - CBC daily   Shock Liver - LFTs improving  - Recheck CMP tomorrow   # Hypernatremia - Continues to be hypernatremic - Increase free water to 200 cc q4h  # Malignant Neoplasm of the Bladder  -Has been treated with Keytruda every 2 months  # Depression -On home Celexa.  Has diazepam available as needed  Best Practice (right click and "Reselect all SmartList Selections" daily)  Diet/type: NPO DVT prophylaxis: Heparin GI prophylaxis: PPI Lines: N/A Foley:  N/A Code Status:  DNR Last date of multidisciplinary goals of care discussion [09/14/2021]  # Goals of Care After discussion with daughter on 2/21, decision was made to transition to full comfort measures with terminal extubation on Friday, February 24th.   Labs   CBC: Recent Labs  Lab 09/09/21 0321 09/10/21 0223 09/11/21 0245 09/13/21 0204 09/13/21 1109 09/14/21 0218 09/15/21 0318  WBC 11.1* 9.0 9.8 11.3*  --  16.5* 13.7*  NEUTROABS 9.6* 7.9* 7.8*  --   --  15.5* 12.2*  HGB 8.6* 9.0* 8.9* 8.6* 8.8* 7.1* 6.9*  HCT 27.2* 27.9* 26.7* 27.1*  26.0* 23.5* 21.7*  MCV 96.8 93.9 91.8 98.2  --  99.6 98.6  PLT 175 210 157 170  --  158 141*    Basic Metabolic Panel: Recent Labs  Lab 09/11/21 0245 09/12/21 0124 09/13/21 0204 09/13/21 1109 09/14/21 0218 09/15/21 0318  NA 143 145 146* 145 147* 148*  K 4.5 4.0 4.0 3.9 4.3 4.1  CL 92* 99 103  --  105 107  CO2 40* 36* 34*  --  34* 31  GLUCOSE 108* 137* 108*  --  139* 119*  BUN 70* 60* 53*  --  50* 49*  CREATININE 1.48* 1.17* 1.15*  --  1.07* 0.96  CALCIUM 7.8* 8.5* 8.5*  --  8.5* 8.6*    GFR: Estimated Creatinine Clearance: 29.4 mL/min (by C-G formula based on SCr of 0.96 mg/dL). Recent Labs  Lab 09/11/21 0245 09/13/21 0204 09/14/21 0218 09/15/21 0318  WBC 9.8 11.3* 16.5* 13.7*     Liver Function Tests: Recent Labs  Lab 09/14/21 0218 09/15/21 0318  AST 126* 54*  ALT 1,028* 663*  ALKPHOS 106 92  BILITOT 0.6 0.2*  PROT 5.4* 5.3*  ALBUMIN 2.3* 2.2*     No results for input(s): LIPASE, AMYLASE in the last 168 hours. No results for input(s): AMMONIA in the last 168 hours.  ABG    Component Value Date/Time   PHART 7.383 09/13/2021 1109   PCO2ART 64.5 (H) 09/13/2021 1109   PO2ART 73 (L) 09/13/2021 1109   HCO3 37.7 (H) 09/13/2021 1109   TCO2 39 (H) 09/13/2021 1109   O2SAT 91 09/13/2021 1109     Coagulation Profile: No results for input(s): INR, PROTIME in the last 168 hours.  Cardiac Enzymes: No results for input(s): CKTOTAL, CKMB, CKMBINDEX, TROPONINI in the last 168 hours.  HbA1C: Hgb A1c MFr Bld  Date/Time Value Ref Range Status  09/19/2021 06:32 PM 5.3 4.8 - 5.6 % Final    Comment:    (NOTE) Pre diabetes:          5.7%-6.4%  Diabetes:              >6.4%  Glycemic control for   <7.0% adults with diabetes   12/04/2017 10:40 AM 4.5 (L) 4.6 - 6.5 % Final    Comment:    Glycemic Control Guidelines for People with Diabetes:Non Diabetic:  <6%Goal of Therapy: <7%Additional Action Suggested:  >8%     CBG: Recent Labs  Lab 09/14/21 1509  09/14/21 1929 09/14/21 2327 09/15/21 0319 09/15/21 0718  GLUCAP 199* 113* 133* 114* 125*    Dr. Jose Persia Internal Medicine PGY-3  09/15/2021, 8:09 AM

## 2021-09-15 NOTE — Progress Notes (Signed)
Family at bedside Orpah Greek, pt nephew) deferred consent for blood products to [name] (pt daughter). Attempted to contact [name], awaiting call back. Vonzell Schlatter RN

## 2021-09-15 NOTE — Progress Notes (Signed)
Nutrition Follow-up  DOCUMENTATION CODES:   Underweight, Severe malnutrition in context of chronic illness  INTERVENTION:   Continue tube feeds via Cortrak: - Vital AF 1.2 @ 45 ml/hr (1080 ml/day) - Free water flushes per CCM, currently 200 ml q 6 hours  Tube feeding regimen provides 1296 kcal, 81 grams of protein, and 876 ml of H2O  Tube feeding regimen provides 1777 mg potassium over 24 hours.  Total free water with flushes: 1676 ml  - Continue cholecalciferol 4000 units daily to replete low vitamin D  - Add zinc sulfate 220 mg BID x 14 days to replete low zinc  - Continue MVI with minerals daily per tube  NUTRITION DIAGNOSIS:   Severe Malnutrition related to chronic illness (COPD, bladder cancer) as evidenced by severe fat depletion, severe muscle depletion.  Ongoing, being addressed via TF  GOAL:   Patient will meet greater than or equal to 90% of their needs  Met via TF  MONITOR:   Vent status, Labs, Weight trends, TF tolerance  REASON FOR ASSESSMENT:   Ventilator, Consult Assessment of nutrition requirement/status  ASSESSMENT:   66 year old female who presented to the ED on 2/09 with SOB x 3-4 days. Pt required intubation in the ED. PMH of COPD, bladder cancer on Keytruda, anemia. Pt admitted with acute on chronic hypoxic respiratory failure.  02/10 - Cortrak placed (tip gastric)  CCM and Palliative care team have had extensive discussions with patient's family. Daughter came to the decision that she would like to transition to comfort care 2/24 with a compassionate extubation. However, would like to continue all life prolonging measures until that time.   Tube feed currently infusing at goal rate and pt appears to be tolerating. No more hyperkalemia. Propofol is no longer being utilized and providing a source of nutrition. Nutrition from TF is still in an acceptable range.   Will continue current nutrition plan for now and adjust based on changing goals  of care  Admit weight: 33.7 kg Current weight: 31.9 kg  Current tube feeds: Vital AF 1.2 @ 45 ml/hr (1080 ml/day), free water flushes 200 ml q 6 hours  Patient remains intubated on ventilator support MV: 11.3 L/min Temp (24hrs), Avg:99 F (37.2 C), Min:98.4 F (36.9 C), Max:99.9 F (37.7 C)  Scheduled Meds:  cholecalciferol  4,000 Units Per Tube Daily   free water  200 mL Per Tube Q6H   insulin aspart  0-9 Units Subcutaneous Q4H   methylPREDNISolone injection  30 mg Intravenous Daily   multivitamin with minerals  1 tablet Per Tube Daily   pantoprazole sodium  40 mg Per Tube Daily   thiamine  100 mg Per Tube Daily   zinc sulfate  220 mg Per Tube BID   Continuous Infusions:  dexmedetomidine (PRECEDEX) IV infusion 1.2 mcg/kg/hr (09/15/21 0212)   feeding supplement (VITAL AF 1.2 CAL) 1,000 mL (09/14/21 1828)   fentaNYL infusion INTRAVENOUS 200 mcg/hr (09/15/21 0000)   PRN Meds: docusate, ondansetron, polyethylene glycol  Vitamin/Mineral Profile: Vitamin B12: 424 (WNL) Folate B9: 7.6 (WNL) Vitamin A: 15.8 (L) Vitamin D: 27.15 (L) Vitamin C: 1.1 (WNL) Copper: 136 (WNL) Zinc: 42 (L)  Labs reviewed: sodium 148, BUN 49, magnesium 2.6. SBG ranges from 103-199 mg/dL over the last 24 hours   Intake/Output Summary (Last 24 hours) at 09/15/2021 0847 Last data filed at 09/15/2021 0602 Gross per 24 hour  Intake 1670.4 ml  Output 1280 ml  Net 390.4 ml  Net IO Since Admission: 6,278.83 mL [09/15/21 0847]  Diet Order:   Diet Order             Diet NPO time specified  Diet effective now                   EDUCATION NEEDS:   Not appropriate for education at this time  Skin:  Skin Assessment: Reviewed RN Assessment (stage 2 to the sacrum)  Last BM:  2/21 - type 7  Height:   Ht Readings from Last 1 Encounters:  09/13/2021 $RemoveB'5\' 5"'uckKSsyn$  (1.651 m)    Weight:   Wt Readings from Last 1 Encounters:  09/15/21 31.9 kg    BMI:  Body mass index is 11.7 kg/m.  Estimated  Nutritional Needs:   Kcal:  1300-1500  Protein:  55-70 grams  Fluid:  1.3-1.5 L    Ranell Patrick, RD, LDN Clinical Dietitian RD pager # available in [facility]  After hours/weekend pager # available in [facility]

## 2021-09-15 NOTE — Progress Notes (Signed)
[facility] Progress Note Patient Name: Donna Curry DOB: 1956-06-04 MRN: [id number]   Date of Service  09/15/2021  HPI/Events of Note  Anemia - Hgb = 6.9.   eICU Interventions  Plan: Transfuse 1 unit PRBC.     Intervention Category Major Interventions: Other:  Orvin Netter Cornelia Copa 09/15/2021, 5:10 AM

## 2021-09-16 LAB — CBC WITH DIFFERENTIAL/PLATELET
Abs Immature Granulocytes: 0.1 10*3/uL — ABNORMAL HIGH (ref 0.00–0.07)
Basophils Absolute: 0 10*3/uL (ref 0.0–0.1)
Basophils Relative: 0 %
Eosinophils Absolute: 0 10*3/uL (ref 0.0–0.5)
Eosinophils Relative: 0 %
HCT: 23.7 % — ABNORMAL LOW (ref 36.0–46.0)
Hemoglobin: 7.3 g/dL — ABNORMAL LOW (ref 12.0–15.0)
Immature Granulocytes: 1 %
Lymphocytes Relative: 6 %
Lymphs Abs: 0.6 10*3/uL — ABNORMAL LOW (ref 0.7–4.0)
MCH: 30.4 pg (ref 26.0–34.0)
MCHC: 30.8 g/dL (ref 30.0–36.0)
MCV: 98.8 fL (ref 80.0–100.0)
Monocytes Absolute: 0.7 10*3/uL (ref 0.1–1.0)
Monocytes Relative: 7 %
Neutro Abs: 7.7 10*3/uL (ref 1.7–7.7)
Neutrophils Relative %: 86 %
Platelets: UNDETERMINED 10*3/uL (ref 150–400)
RBC: 2.4 MIL/uL — ABNORMAL LOW (ref 3.87–5.11)
RDW: 18.8 % — ABNORMAL HIGH (ref 11.5–15.5)
WBC: 9 10*3/uL (ref 4.0–10.5)
nRBC: 0.2 % (ref 0.0–0.2)

## 2021-09-16 LAB — GLUCOSE, CAPILLARY
Glucose-Capillary: 112 mg/dL — ABNORMAL HIGH (ref 70–99)
Glucose-Capillary: 113 mg/dL — ABNORMAL HIGH (ref 70–99)
Glucose-Capillary: 115 mg/dL — ABNORMAL HIGH (ref 70–99)
Glucose-Capillary: 123 mg/dL — ABNORMAL HIGH (ref 70–99)
Glucose-Capillary: 131 mg/dL — ABNORMAL HIGH (ref 70–99)
Glucose-Capillary: 155 mg/dL — ABNORMAL HIGH (ref 70–99)

## 2021-09-16 LAB — COMPREHENSIVE METABOLIC PANEL
ALT: 458 U/L — ABNORMAL HIGH (ref 0–44)
AST: 49 U/L — ABNORMAL HIGH (ref 15–41)
Albumin: 1.9 g/dL — ABNORMAL LOW (ref 3.5–5.0)
Alkaline Phosphatase: 84 U/L (ref 38–126)
Anion gap: 9 (ref 5–15)
BUN: 46 mg/dL — ABNORMAL HIGH (ref 8–23)
CO2: 27 mmol/L (ref 22–32)
Calcium: 8.2 mg/dL — ABNORMAL LOW (ref 8.9–10.3)
Chloride: 110 mmol/L (ref 98–111)
Creatinine, Ser: 0.95 mg/dL (ref 0.44–1.00)
GFR, Estimated: >60 mL/min (ref >60)
Glucose, Bld: 117 mg/dL — ABNORMAL HIGH (ref 70–99)
Potassium: 4.9 mmol/L (ref 3.5–5.1)
Sodium: 146 mmol/L — ABNORMAL HIGH (ref 135–145)
Total Bilirubin: 0.6 mg/dL (ref 0.3–1.2)
Total Protein: 5 g/dL — ABNORMAL LOW (ref 6.5–8.1)

## 2021-09-16 MED ORDER — FENTANYL 2500MCG IN NS 250ML (10MCG/ML) PREMIX INFUSION
0.0000 ug/h | INTRAVENOUS | Status: DC
  Administered 2021-09-16: 300 ug/h via INTRAVENOUS
  Administered 2021-09-16 – 2021-09-17 (×3): 325 ug/h via INTRAVENOUS
  Filled 2021-09-16 (×4): qty 250

## 2021-09-16 MED ORDER — CLONAZEPAM 1 MG PO TABS
1.0000 mg | ORAL_TABLET | Freq: Two times a day (BID) | ORAL | Status: DC | PRN
  Administered 2021-09-16: 1 mg
  Filled 2021-09-16: qty 1

## 2021-09-16 NOTE — Progress Notes (Signed)
[facility] Progress Note Patient Name: [name] DOB: 11-11-1955 MRN: [id number]   Date of Service  09/16/2021  HPI/Events of Note  Ventilator asynchrony  eICU Interventions  Will increase ceiling on Fentanyl IV infusion to 400 mcg/hour.     Intervention Category Major Interventions: Respiratory failure - evaluation and management  Ruwayda Curet [place] 09/16/2021, 5:46 AM

## 2021-09-16 NOTE — Progress Notes (Addendum)
NAME:  Donna Curry, MRN:  [id number], DOB:  07/23/1956, LOS: 15 ADMISSION DATE:  08/25/2021, CONSULTATION DATE:  09/06/2021  REFERRING MD:  Dr. Dina Rich, CHIEF COMPLAINT:  COPD exacerbation    History of Present Illness:  [name] is a 66 year old female with a past medical history of chronic hypoxic respiratory failure 2/2 COPD on home supplemental oxygen (2L), bladder malignancy currently on Keytruda, iron deficiency anemia who presented to the ED with complaints of shortness of breath.  History limited 2/2 emergent intubation and sedation.  Per chart review, patient has been experiencing increasing shortness of breath for several days now.  She has been compliant with her home supplemental oxygen and nebulizer treatments.  On EMS arrival, she was hypoxic at 66% on home oxygen, with tachypnea and tripoding.  Patient was placed on CPAP with improvement in oxygenation up to 95%.  She was subsequently placed on BiPAP however due to worsening work of breathing, intubation was pursued.  Patient has received breathing treatments and high dose steroids.   Pertinent  Medical History   Past Medical History:  Diagnosis Date   Anemia    Asthma    Dyspnea    with exertion    Family history of adverse reaction to anesthesia    brother wakes up and dont know who he is and sister has n/v   Goals of care, counseling/discussion 02/09/2018   History of blood transfusion    Hypertension    Iron deficiency anemia due to chronic blood loss 03/01/2018   Measles as a child   Mumps as a child   TIA (transient ischemic attack)    Tobacco abuse    Significant Hospital Events: Including procedures, antibiotic start and stop dates in addition to other pertinent events   09/18/2021: Presented to the ED. Intubated. Transferred to ICU 09/07/2021: Patient repeatedly fails SBTs. Discussion with family that extubation is unlikely. Family considering comfort care versus trach.  2/16: Acute decompensation in the  early hours with severe acidemia, hypotension 2/20: Vanc and Cefepime started for sepsis. 2/21: Plan for comfort measures on 2/24  Interim History / Subjective:   No additional fevers overnight. Some hypotension overnight but resolved with weaning of Precedex. Dyssynchrony noted on vent that improved with increasing Fentanyl requirement   Sedated this AM.   Objective   Blood pressure 127/80, pulse (!) 106, temperature 99.4 F (37.4 C), temperature source Axillary, resp. rate (!) 21, height 5\' 5"  (1.651 m), weight 31.7 kg, SpO2 92 %.    Vent Mode: PRVC FiO2 (%):  [40 %] 40 % Set Rate:  [18 bmp] 18 bmp Vt Set:  [500 mL] 500 mL PEEP:  [5 cmH20-8 cmH20] 5 cmH20 Plateau Pressure:  [15 cmH20-18 cmH20] 17 cmH20   Intake/Output Summary (Last 24 hours) at 09/16/2021 0859 Last data filed at 09/16/2021 0800 Gross per 24 hour  Intake 4278.48 ml  Output 1485 ml  Net 2793.48 ml    Filed Weights   09/14/21 0500 09/15/21 0500 09/16/21 0335  Weight: 36.9 kg 31.9 kg 31.7 kg   Examination: General: Cachectic, ill-appearing woman ventilated.  HENT: ET tube in good position. Moist mucus membranes.  Lungs: Significant rhonchi compared to prior day. Present throughout.   Cardiovascular: Distant, regular rhythm with tachycardia, no murmur Abdomen: Non-distended, positive bowel sounds Extremities: Poor muscle mass, no edema Neuro: Sedated.  GU: Deferred  Resolved Hospital Problem list   N/A  Assessment & Plan:   # Sepsis Source of infection un-identified at this  time although high risk for hospital acquired infection. UA inconsistent with UTI (negative leukocytes, nitrates). MRSA PCR negative.   - Blood cultures pending. NGTD x 3 days  - Tracheal aspirates pending still - Continue Cefepime. Will likely discontinue tomorrow once comfort measures are in place  # Acute on Chronic Hypoxic and Hypercapnic Respiratory Failure # COPD Exacerbation  # Multi-focal CAP: Resolved Completed 3 day  course of Azithromycin (2/9 - 2/11) and 5 day course of Ceftriaxone (2/9 - 2-13). Cultures negative.   -Continue Solu-Medrol. Will discontinue tomorrow once comfort measures are in place.  -Continue full vent support  -Daily SBT as tolerated -Scheduled bronchodilators as ordered -PAD protocol: Precedex, fentanyl infusions -Given goals of care discussions, plan for terminal extubation on Friday, February 24th  # Hypotension # History of Hypertension  - Hold Amlodipine   # Acute Kidney Injury:  Resolved # Hyperkalemia: Resolved  - Creatinine stable. BUN high due to Solumedrol   - Foley catheter in place - Follow urine output, BMP - Monitor electrolytes and replenish PRN   # Acute on Chronic Normocytic Anemia # History of IDA - Iron panel consistent with anemia of chronic disease  - Hemoglobin stable.  - CBC daily   Shock Liver - LFTs improving  - Discontinue monitoring LFTs  # Hypernatremia - Continues to be hypernatremic - Continue free water to 200 cc q4h  # Malignant Neoplasm of the Bladder  -Has been treated with Keytruda every 2 months  # Depression -On home Celexa.  Has diazepam available as needed  Best Practice (right click and "Reselect all SmartList Selections" daily)  Diet/type: NPO DVT prophylaxis: Heparin GI prophylaxis: PPI Lines: N/A Foley:  N/A Code Status:  DNR Last date of multidisciplinary goals of care discussion [09/14/2021]  # Goals of Care After discussion with daughter on 2/21, decision was made to transition to full comfort measures with terminal extubation on Friday, February 24th.   Labs   CBC: Recent Labs  Lab 09/10/21 0223 09/11/21 0245 09/13/21 0204 09/13/21 1109 09/14/21 0218 09/15/21 0318 09/16/21 0203  WBC 9.0 9.8 11.3*  --  16.5* 13.7* 9.0  NEUTROABS 7.9* 7.8*  --   --  15.5* 12.2* 7.7  HGB 9.0* 8.9* 8.6* 8.8* 7.1* 6.9* 7.3*  HCT 27.9* 26.7* 27.1* 26.0* 23.5* 21.7* 23.7*  MCV 93.9 91.8 98.2  --  99.6 98.6 98.8  PLT 210  157 170  --  158 141* PLATELET CLUMPS NOTED ON SMEAR, UNABLE TO ESTIMATE    Basic Metabolic Panel: Recent Labs  Lab 09/12/21 0124 09/13/21 0204 09/13/21 1109 09/14/21 0218 09/15/21 0318 09/16/21 0203  NA 145 146* 145 147* 148* 146*  K 4.0 4.0 3.9 4.3 4.1 4.9  CL 99 103  --  105 107 110  CO2 36* 34*  --  34* 31 27  GLUCOSE 137* 108*  --  139* 119* 117*  BUN 60* 53*  --  50* 49* 46*  CREATININE 1.17* 1.15*  --  1.07* 0.96 0.95  CALCIUM 8.5* 8.5*  --  8.5* 8.6* 8.2*    GFR: Estimated Creatinine Clearance: 29.5 mL/min (by C-G formula based on SCr of 0.95 mg/dL). Recent Labs  Lab 09/13/21 0204 09/14/21 0218 09/15/21 0318 09/16/21 0203  WBC 11.3* 16.5* 13.7* 9.0     Liver Function Tests: Recent Labs  Lab 09/14/21 0218 09/15/21 0318 09/16/21 0203  AST 126* 54* 49*  ALT 1,028* 663* 458*  ALKPHOS 106 92 84  BILITOT 0.6 0.2* 0.6  PROT 5.4*  5.3* 5.0*  ALBUMIN 2.3* 2.2* 1.9*     No results for input(s): LIPASE, AMYLASE in the last 168 hours. No results for input(s): AMMONIA in the last 168 hours.  ABG    Component Value Date/Time   PHART 7.383 09/13/2021 1109   PCO2ART 64.5 (H) 09/13/2021 1109   PO2ART 73 (L) 09/13/2021 1109   HCO3 37.7 (H) 09/13/2021 1109   TCO2 39 (H) 09/13/2021 1109   O2SAT 91 09/13/2021 1109     Coagulation Profile: No results for input(s): INR, PROTIME in the last 168 hours.  Cardiac Enzymes: No results for input(s): CKTOTAL, CKMB, CKMBINDEX, TROPONINI in the last 168 hours.  HbA1C: Hgb A1c MFr Bld  Date/Time Value Ref Range Status  09/09/2021 06:32 PM 5.3 4.8 - 5.6 % Final    Comment:    (NOTE) Pre diabetes:          5.7%-6.4%  Diabetes:              >6.4%  Glycemic control for   <7.0% adults with diabetes   12/04/2017 10:40 AM 4.5 (L) 4.6 - 6.5 % Final    Comment:    Glycemic Control Guidelines for People with Diabetes:Non Diabetic:  <6%Goal of Therapy: <7%Additional Action Suggested:  >8%     CBG: Recent Labs  Lab  09/15/21 1640 09/15/21 1936 09/15/21 2332 09/16/21 0333 09/16/21 0742  GLUCAP 193* 121* 136* 123* 131*    Dr. Jose Persia Internal Medicine PGY-3  09/16/2021, 8:59 AM

## 2021-09-16 NOTE — Plan of Care (Signed)
°  Problem: Education: Goal: Knowledge of General Education information will improve Description: Including pain rating scale, medication(s)/side effects and non-pharmacologic comfort measures Outcome: Not Progressing   Problem: Health Behavior/Discharge Planning: Goal: Ability to manage health-related needs will improve Outcome: Not Progressing   Problem: Clinical Measurements: Goal: Will remain free from infection Outcome: Progressing Goal: Respiratory complications will improve Outcome: Not Progressing Goal: Cardiovascular complication will be avoided Outcome: Progressing   Problem: Activity: Goal: Risk for activity intolerance will decrease Outcome: Not Progressing   Problem: Nutrition: Goal: Adequate nutrition will be maintained Outcome: Progressing

## 2021-09-16 NOTE — Progress Notes (Signed)
Interval Progress Note:   Discussed with patient's daughter at bedside. She would like to pursue terminal extubation tomorrow, 23-Sep-2021 at 10:30AM. Palliative care team informed.   Signed,  Dr. Jose Persia Internal Medicine PGY-3  09/16/2021, 3:21 PM

## 2021-09-17 DIAGNOSIS — R0609 Other forms of dyspnea: Secondary | ICD-10-CM

## 2021-09-17 DIAGNOSIS — J9621 Acute and chronic respiratory failure with hypoxia: Principal | ICD-10-CM

## 2021-09-17 DIAGNOSIS — J9622 Acute and chronic respiratory failure with hypercapnia: Secondary | ICD-10-CM

## 2021-09-17 DIAGNOSIS — Z515 Encounter for palliative care: Secondary | ICD-10-CM

## 2021-09-17 LAB — GLUCOSE, CAPILLARY
Glucose-Capillary: 111 mg/dL — ABNORMAL HIGH (ref 70–99)
Glucose-Capillary: 105 mg/dL — ABNORMAL HIGH (ref 70–99)

## 2021-09-17 MED ORDER — MORPHINE BOLUS VIA INFUSION
1.0000 mg | INTRAVENOUS | Status: DC | PRN
  Administered 2021-09-17: 1 mg via INTRAVENOUS
  Filled 2021-09-17: qty 1

## 2021-09-17 MED ORDER — LORAZEPAM 2 MG/ML IJ SOLN
2.0000 mg | INTRAMUSCULAR | Status: DC | PRN
  Administered 2021-09-17 (×2): 2 mg via INTRAVENOUS
  Filled 2021-09-17: qty 1

## 2021-09-17 MED ORDER — GLYCOPYRROLATE 0.2 MG/ML IJ SOLN
0.1000 mg | INTRAMUSCULAR | Status: DC | PRN
  Administered 2021-09-17: 0.1 mg via INTRAVENOUS
  Filled 2021-09-17: qty 1

## 2021-09-17 MED ORDER — LORAZEPAM 2 MG/ML IJ SOLN
2.0000 mg | INTRAMUSCULAR | Status: DC | PRN
  Administered 2021-09-17: 2 mg via INTRAVENOUS
  Filled 2021-09-17 (×2): qty 1

## 2021-09-17 MED ORDER — MORPHINE 100MG IN NS 100ML (1MG/ML) PREMIX INFUSION
5.0000 mg/h | INTRAVENOUS | Status: DC
  Administered 2021-09-17: 5 mg/h via INTRAVENOUS
  Filled 2021-09-17: qty 100

## 2021-09-18 LAB — BPAM RBC
Blood Product Expiration Date: 202303042359
Unit Type and Rh: 6200

## 2021-09-18 LAB — CULTURE, BLOOD (ROUTINE X 2)
Culture: NO GROWTH
Culture: NO GROWTH
Special Requests: ADEQUATE
Special Requests: ADEQUATE

## 2021-09-18 LAB — TYPE AND SCREEN
ABO/RH(D): A POS
Antibody Screen: NEGATIVE
Unit division: 0

## 2021-09-20 ENCOUNTER — Ambulatory Visit: Payer: Self-pay

## 2021-09-20 ENCOUNTER — Other Ambulatory Visit: Payer: Medicare Other

## 2021-09-20 ENCOUNTER — Ambulatory Visit: Payer: Medicare Other

## 2021-09-20 ENCOUNTER — Ambulatory Visit: Payer: Medicare Other | Admitting: Hematology & Oncology

## 2021-09-20 NOTE — Chronic Care Management (AMB) (Signed)
°  Care Management   Outreach Note  09/20/2021 Name: Donna Curry MRN: [id number] DOB: [birth year]  Referred by: [name], NP Reason for referral : No chief complaint on file.   Patient was referred for care management engagement. It is noted in the EHR that the patient is deceased as of Sep 26, 2021 (date). The primary care provider has been notified of this information. No further outreach on the part of the embedded care management team will be made.    Thea Silversmith, RN, MSN, BSN, CCM Care Management Coordinator [facility] [phone]

## 2021-09-22 NOTE — Procedures (Signed)
Extubation Procedure Note  Patient Details:   Name: Donna Curry DOB: Dec 01, 1955 MRN: [id number]   Airway Documentation:    Vent end date: 09-21-2021 Vent end time: 1210   Evaluation  O2 sats:  terminal extubation  Complications: No apparent complications Patient did tolerate procedure well. Bilateral Breath Sounds: Diminished   No  Pt was terminally extubated per MD.  Ronaldo Miyamoto 09/21/21, 12:13 PM

## 2021-09-22 NOTE — Progress Notes (Signed)
Patient ID: CORNELL GABER, female   DOB: 10-23-1955, 66 y.o.   MRN: [id number]    Progress Note from the Palliative Medicine Team at [facility]   Patient Name: Donna Curry        Date: 10-05-2021 DOB: 10-31-1955  Age: 66 y.o. MRN#: [id number] Attending Physician: Chesley Mires, MD Primary Care Physician: [name], NP Admit Date: 09/01/2021   Medical records reviewed   Donna. Donna Curry is a 66 year old female with a past medical history of chronic hypoxic respiratory failure 2/2 COPD on home supplemental oxygen (2L), bladder malignancy currently on Keytruda, iron deficiency anemia who presented to the ED with complaints of shortness of breath.  History limited 2/2 emergent intubation and sedation.  Significant Hospital Events   09/15/2021: Presented to the ED. Intubated. Transferred to ICU 09/07/2021: Patient repeatedly fails SBTs. Discussion with family that extubation is unlikely. Family considering comfort care versus trach.  2/16: Acute decompensation in the early hours with severe acidemia, hypotension 2/20: Vanc and Cefepime started for sepsis. 2/21: Plan for comfort measures on 2/24    This NP visited patient/family  at the bedside as a follow up for palliative medicine needs and emotional support and to assit family as they navigate liberating Donna Curry from the ventilator.  I met with daughter Donna Curry and her son Donna Curry for ongoing education regarding current medcial situation and to explore family decision to shift to a full comfort path, liberating patient from ventilator  focusing on comfort and dignity.  Donna Curry clearly verbalizes an understanding of the patient's limited prognosis and hopes for comfort and dignity for her mother at this time in her life.  Education offered on the logistics of a one-way extubation, shifting to a full comfort path.  Daughter verbalizes her only concern is "I do not want to see my mother struggle".  Education offered on the  utilization of medications to treat symptoms at end-of-life to enhance comfort.  Family declined chaplain support  Education offered on the natural trajectory and expectations at end-of-life.  Emotional support offered.   Plan of care -DNR/DNI -Liberation from the ventilator today at 1200-family is gathering to be present -Symptom management, enhancing comfort      -DC fentanyl drip and initiate morphine drip      -Ativan and Robinul -Prognosis is likely hours to days, expect hospital death  Questions and concerns addressed   Discussed with Dr Halford Chessman and bedside RN   Wadie Lessen NP  Palliative Medicine Team Team Phone # [phone] Pager [phone]

## 2021-09-22 NOTE — Progress Notes (Addendum)
NAME:  Donna Curry, MRN:  [id number], DOB:  29-Oct-1955, LOS: 20 ADMISSION DATE:  09/05/2021, CONSULTATION DATE:  09/16/2021  REFERRING MD:  Dr. Dina Rich, CHIEF COMPLAINT:  COPD exacerbation    History of Present Illness:  Ms. Sebrina Curry is a 66 year old female with a past medical history of chronic hypoxic respiratory failure 2/2 COPD on home supplemental oxygen (2L), bladder malignancy currently on Keytruda, iron deficiency anemia who presented to the ED with complaints of shortness of breath.  History limited 2/2 emergent intubation and sedation.  Per chart review, patient has been experiencing increasing shortness of breath for several days now.  She has been compliant with her home supplemental oxygen and nebulizer treatments.  On EMS arrival, she was hypoxic at 66% on home oxygen, with tachypnea and tripoding.  Patient was placed on CPAP with improvement in oxygenation up to 95%.  She was subsequently placed on BiPAP however due to worsening work of breathing, intubation was pursued.  Patient has received breathing treatments and high dose steroids.   Pertinent  Medical History   Past Medical History:  Diagnosis Date   Anemia    Asthma    Dyspnea    with exertion    Family history of adverse reaction to anesthesia    brother wakes up and dont know who he is and sister has n/v   Goals of care, counseling/discussion 02/09/2018   History of blood transfusion    Hypertension    Iron deficiency anemia due to chronic blood loss 03/01/2018   Measles as a child   Mumps as a child   TIA (transient ischemic attack)    Tobacco abuse    Significant Hospital Events: Including procedures, antibiotic start and stop dates in addition to other pertinent events   09/12/2021: Presented to the ED. Intubated. Transferred to ICU 09/07/2021: Patient repeatedly fails SBTs. Discussion with family that extubation is unlikely. Family considering comfort care versus trach.  2/16: Acute decompensation in the  early hours with severe acidemia, hypotension 2/20: Vanc and Cefepime started for sepsis. 2/21: Plan for comfort measures on 2/24  Interim History / Subjective:   No significant overnight events.  Agitated this AM. Intermittently following commands.   Objective   Blood pressure (!) 155/93, pulse 69, temperature 99.2 F (37.3 C), temperature source Oral, resp. rate (!) 24, height 5\' 5"  (1.651 m), weight 31.7 kg, SpO2 92 %.    Vent Mode: PRVC FiO2 (%):  [40 %] 40 % Set Rate:  [18 bmp] 18 bmp Vt Set:  [500 mL] 500 mL PEEP:  [5 cmH20] 5 cmH20 Plateau Pressure:  [16 cmH20-23 cmH20] 20 cmH20   Intake/Output Summary (Last 24 hours) at 10/08/2021 0824 Last data filed at 09/16/2021 1900 Gross per 24 hour  Intake 1592.34 ml  Output 650 ml  Net 942.34 ml    Filed Weights   09/14/21 0500 09/15/21 0500 09/16/21 0335  Weight: 36.9 kg 31.9 kg 31.7 kg   Examination: General: Cachectic, ill-appearing woman ventilated.  HENT: ET tube in good position. Moist mucus membranes.  Lungs: Significant rhonchi s till present throughout.   Cardiovascular: Distant, regular rhythm with tachycardia, no murmur Abdomen: Non-distended, positive bowel sounds Extremities: Poor muscle mass, no edema Neuro: Sedated.  GU: Deferred  Resolved Hospital Problem list   N/A  Assessment & Plan:   # Sepsis Source of infection un-identified at this time although high risk for hospital acquired infection. UA inconsistent with UTI (negative leukocytes, nitrates). MRSA PCR negative. Discontinue antibiotics  once comfort measures in place.   # Acute on Chronic Hypoxic and Hypercapnic Respiratory Failure # COPD Exacerbation  # Multi-focal CAP: Resolved Completed 3 day course of Azithromycin (2/9 - 2/11) and 5 day course of Ceftriaxone (2/9 - 2-13). Cultures negative.   - Continue full vent support  - Terminal extubation planned for 10:30 AM. ADDENDUM: Planned for noon.  - Palliative following; appreciate their  recommendations   # Hypotension # History of Hypertension  # Acute Kidney Injury:  Resolved # Hyperkalemia: Resolved  # Acute on Chronic Normocytic Anemia # History of IDA # Shock Liver # Hypernatremia # Malignant Neoplasm of the Bladder  # Depression - Comfort measures to be pursued today per family wishes   Best Practice (right click and "Reselect all SmartList Selections" daily)  Diet/type: NPO DVT prophylaxis: Heparin GI prophylaxis: PPI Lines: N/A Foley:  N/A Code Status:  DNR/DNI Last date of multidisciplinary goals of care discussion [09/16/2021]  Labs   CBC: Recent Labs  Lab 09/11/21 0245 09/13/21 0204 09/13/21 1109 09/14/21 0218 09/15/21 0318 09/16/21 0203  WBC 9.8 11.3*  --  16.5* 13.7* 9.0  NEUTROABS 7.8*  --   --  15.5* 12.2* 7.7  HGB 8.9* 8.6* 8.8* 7.1* 6.9* 7.3*  HCT 26.7* 27.1* 26.0* 23.5* 21.7* 23.7*  MCV 91.8 98.2  --  99.6 98.6 98.8  PLT 157 170  --  158 141* PLATELET CLUMPS NOTED ON SMEAR, UNABLE TO ESTIMATE    Basic Metabolic Panel: Recent Labs  Lab 09/12/21 0124 09/13/21 0204 09/13/21 1109 09/14/21 0218 09/15/21 0318 09/16/21 0203  NA 145 146* 145 147* 148* 146*  K 4.0 4.0 3.9 4.3 4.1 4.9  CL 99 103  --  105 107 110  CO2 36* 34*  --  34* 31 27  GLUCOSE 137* 108*  --  139* 119* 117*  BUN 60* 53*  --  50* 49* 46*  CREATININE 1.17* 1.15*  --  1.07* 0.96 0.95  CALCIUM 8.5* 8.5*  --  8.5* 8.6* 8.2*    GFR: Estimated Creatinine Clearance: 29.5 mL/min (by C-G formula based on SCr of 0.95 mg/dL). Recent Labs  Lab 09/13/21 0204 09/14/21 0218 09/15/21 0318 09/16/21 0203  WBC 11.3* 16.5* 13.7* 9.0     Liver Function Tests: Recent Labs  Lab 09/14/21 0218 09/15/21 0318 09/16/21 0203  AST 126* 54* 49*  ALT 1,028* 663* 458*  ALKPHOS 106 92 84  BILITOT 0.6 0.2* 0.6  PROT 5.4* 5.3* 5.0*  ALBUMIN 2.3* 2.2* 1.9*     No results for input(s): LIPASE, AMYLASE in the last 168 hours. No results for input(s): AMMONIA in the last 168  hours.  ABG    Component Value Date/Time   PHART 7.383 09/13/2021 1109   PCO2ART 64.5 (H) 09/13/2021 1109   PO2ART 73 (L) 09/13/2021 1109   HCO3 37.7 (H) 09/13/2021 1109   TCO2 39 (H) 09/13/2021 1109   O2SAT 91 09/13/2021 1109     Coagulation Profile: No results for input(s): INR, PROTIME in the last 168 hours.  Cardiac Enzymes: No results for input(s): CKTOTAL, CKMB, CKMBINDEX, TROPONINI in the last 168 hours.  HbA1C: Hgb A1c MFr Bld  Date/Time Value Ref Range Status  08/26/2021 06:32 PM 5.3 4.8 - 5.6 % Final    Comment:    (NOTE) Pre diabetes:          5.7%-6.4%  Diabetes:              >6.4%  Glycemic control for   <  7.0% adults with diabetes   12/04/2017 10:40 AM 4.5 (L) 4.6 - 6.5 % Final    Comment:    Glycemic Control Guidelines for People with Diabetes:Non Diabetic:  <6%Goal of Therapy: <7%Additional Action Suggested:  >8%     CBG: Recent Labs  Lab 09/16/21 1558 09/16/21 1942 09/16/21 2333 13-Oct-2021 0321 13-Oct-2021 0725  GLUCAP 155* 112* 113* 111* 105*    Dr. Jose Persia Internal Medicine PGY-3  10-13-21, 8:24 AM

## 2021-09-22 NOTE — Death Summary Note (Signed)
° °  Death Summary   ALONIE GAZZOLA QXI:[id number] DOB: [birth year] DOA: 10-01-2021  PCP: [name], NP  Admit date: 10/01/2021 Date of Death: 2021-10-17  Final Diagnoses:  Active Problems:   COPD exacerbation ([place])   Acute respiratory failure with hypoxia ([facility])   Pressure injury of skin   History of present illness:  [name] is a 66 year old female with a past medical history of chronic hypoxic respiratory failure 2/2 COPD on home supplemental oxygen (2L), bladder malignancy currently on Keytruda, iron deficiency anemia who presented to the ED with complaints of shortness of breath.  History limited 2/2 emergent intubation and sedation.   Per chart review, patient has been experiencing increasing shortness of breath for several days now.  She has been compliant with her home supplemental oxygen and nebulizer treatments.  On EMS arrival, she was hypoxic at 66% on home oxygen, with tachypnea and tripoding.  Patient was placed on CPAP with improvement in oxygenation up to 95%.  She was subsequently placed on BiPAP however due to worsening work of breathing, intubation was pursued.  Patient has received breathing treatments and high dose steroids.   Hospital Course:  On admission, patient was started on empiric antibiotics with azithromycin and ceftriaxone for multifocal pneumonia.  She completed a 5-day course of ceftriaxone and 3-day course of high-dose azithromycin.  Due to worsening in respiratory status, treatment began for COPD exacerbation with high-dose Solu-Medrol.  Despite this, patient was unable to tolerate SBT due to severe tachycardia, hypoxia, and tachypnea.  After numerous attempts over the course of 12 days, family opted for transition to comfort care only.  Hospital course was complicated by sepsis due to suspected hospital-acquired infection treated with vancomycin and cefepime.  Etiology undetermined with negative blood cultures.  Tracheal aspirate culture still  pending.  Hospital course also complicated by AKI and hyperkalemia, acute on chronic normocytic anemia, shock liver, and hypernatremia.  Signed: Dr. Jose Persia Internal Medicine PGY-3  10-17-21, 5:39 PM

## 2021-09-22 NOTE — Progress Notes (Signed)
°  Transition of Care [facility]) Screening Note   Patient Details  Name: MEGON KALINA Date of Birth: [birth year]   Transition of Care [facility]) CM/SW Contact:    Tom-Johnson, Renea Ee, RN Phone Number: 09-20-2021, 3:15 PM  Transition to comfort care today. Family at bedside. TOC will continue to follow with needs and render patient and family support.

## 2021-09-22 NOTE — Progress Notes (Signed)
Patient made comfort care at 1205.  One way extubation completed at 1210 without any complications.  Morphine drip initiated before extubation for patient comfort.  Family at bedside during extubation.  I will continue support to patient and family.

## 2021-09-22 NOTE — Progress Notes (Signed)
Patient passed away at 19:36 with family at bedside. Margaree Mackintosh MD Elsie Lincoln was contacted and made aware at 19:45. Family educated about post mortem care. Stefanie Viverito RN and I, Laural Roes RN auscultated for heart sounds. Patient was pronounced dead at 27.

## 2021-09-22 DEATH — deceased

## 2021-09-23 NOTE — Chronic Care Management (AMB) (Signed)
?  Care Management  ? ?Note ? ?09/23/2021 ?Name: ELENA DAVIA MRN: [id number] DOB: 07-19-1956 ? ?MORENE CECILIO is a 66 y.o. year old female who is a primary care patient of [name], NP and is actively engaged with the care management team. I reached out to Ray[place] Go by phone today to assist with re-scheduling a follow up visit with the RN Case Manager ? ?Follow up plan: ?Per chart pt deceased as of 10-08-21 - closing follow up note ? ?Aynslee Mulhall, CCMA ?Care Guide, Embedded Care Coordination ?[facility]  Care Management  ?Direct Dial: [phone] ?  ?

## 2021-10-22 LAB — ACID FAST CULTURE WITH REFLEXED SENSITIVITIES (MYCOBACTERIA): Acid Fast Culture: NEGATIVE

## 2021-11-01 ENCOUNTER — Ambulatory Visit: Payer: Medicare Other | Admitting: Family

## 2024-08-15 ENCOUNTER — Other Ambulatory Visit (HOSPITAL_BASED_OUTPATIENT_CLINIC_OR_DEPARTMENT_OTHER): Payer: Self-pay
